# Patient Record
Sex: Female | Born: 1955 | Race: Black or African American | Hispanic: No | Smoking: Former smoker
Health system: Southern US, Community
[De-identification: ages and names within clinical notes are randomized; demographics above are authoritative.]

## PROBLEM LIST (undated history)

## (undated) DIAGNOSIS — G4730 Sleep apnea, unspecified: Secondary | ICD-10-CM

## (undated) DIAGNOSIS — J45909 Unspecified asthma, uncomplicated: Secondary | ICD-10-CM

## (undated) DIAGNOSIS — I639 Cerebral infarction, unspecified: Secondary | ICD-10-CM

## (undated) DIAGNOSIS — I10 Essential (primary) hypertension: Secondary | ICD-10-CM

## (undated) DIAGNOSIS — K219 Gastro-esophageal reflux disease without esophagitis: Secondary | ICD-10-CM

## (undated) DIAGNOSIS — F1010 Alcohol abuse, uncomplicated: Secondary | ICD-10-CM

## (undated) DIAGNOSIS — R569 Unspecified convulsions: Secondary | ICD-10-CM

## (undated) DIAGNOSIS — M62449 Contracture of muscle, unspecified hand: Secondary | ICD-10-CM

## (undated) DIAGNOSIS — K279 Peptic ulcer, site unspecified, unspecified as acute or chronic, without hemorrhage or perforation: Secondary | ICD-10-CM

## (undated) HISTORY — DX: Essential (primary) hypertension: I10

## (undated) HISTORY — PX: COLONOSCOPY: SHX174

## (undated) HISTORY — PX: BRAIN SURGERY: SHX531

## (undated) HISTORY — DX: Peptic ulcer, site unspecified, unspecified as acute or chronic, without hemorrhage or perforation: K27.9

---

## 2001-03-04 ENCOUNTER — Inpatient Hospital Stay (HOSPITAL_COMMUNITY): Admission: RE | Admit: 2001-03-04 | Discharge: 2001-03-07 | Payer: Self-pay | Admitting: Family Medicine

## 2001-04-02 ENCOUNTER — Emergency Department (HOSPITAL_COMMUNITY): Admission: EM | Admit: 2001-04-02 | Discharge: 2001-04-03 | Payer: Self-pay | Admitting: *Deleted

## 2001-04-02 ENCOUNTER — Encounter: Payer: Self-pay | Admitting: *Deleted

## 2001-05-25 ENCOUNTER — Encounter: Payer: Self-pay | Admitting: Family Medicine

## 2001-05-25 ENCOUNTER — Ambulatory Visit (HOSPITAL_COMMUNITY): Admission: RE | Admit: 2001-05-25 | Discharge: 2001-05-25 | Payer: Self-pay | Admitting: Family Medicine

## 2002-06-13 ENCOUNTER — Ambulatory Visit (HOSPITAL_COMMUNITY): Admission: RE | Admit: 2002-06-13 | Discharge: 2002-06-13 | Payer: Self-pay | Admitting: Family Medicine

## 2002-06-13 ENCOUNTER — Encounter: Payer: Self-pay | Admitting: Family Medicine

## 2002-09-25 ENCOUNTER — Encounter (HOSPITAL_COMMUNITY): Admission: RE | Admit: 2002-09-25 | Discharge: 2002-10-25 | Payer: Self-pay | Admitting: Oncology

## 2002-09-25 ENCOUNTER — Encounter: Admission: RE | Admit: 2002-09-25 | Discharge: 2002-09-25 | Payer: Self-pay | Admitting: Oncology

## 2002-10-18 HISTORY — PX: ABDOMINAL HYSTERECTOMY: SHX81

## 2002-11-28 ENCOUNTER — Encounter: Admission: RE | Admit: 2002-11-28 | Discharge: 2002-11-28 | Payer: Self-pay | Admitting: Oncology

## 2002-11-28 ENCOUNTER — Encounter (HOSPITAL_COMMUNITY): Admission: RE | Admit: 2002-11-28 | Discharge: 2002-12-28 | Payer: Self-pay | Admitting: Oncology

## 2002-12-27 ENCOUNTER — Encounter (HOSPITAL_COMMUNITY): Admission: RE | Admit: 2002-12-27 | Discharge: 2003-01-26 | Payer: Self-pay | Admitting: Oncology

## 2002-12-27 ENCOUNTER — Encounter: Admission: RE | Admit: 2002-12-27 | Discharge: 2002-12-27 | Payer: Self-pay | Admitting: Oncology

## 2003-01-28 ENCOUNTER — Encounter: Admission: RE | Admit: 2003-01-28 | Discharge: 2003-01-28 | Payer: Self-pay | Admitting: Oncology

## 2003-01-28 ENCOUNTER — Encounter (HOSPITAL_COMMUNITY): Admission: RE | Admit: 2003-01-28 | Discharge: 2003-02-27 | Payer: Self-pay | Admitting: Oncology

## 2003-02-18 ENCOUNTER — Inpatient Hospital Stay (HOSPITAL_COMMUNITY): Admission: RE | Admit: 2003-02-18 | Discharge: 2003-02-21 | Payer: Self-pay | Admitting: Obstetrics and Gynecology

## 2003-03-11 ENCOUNTER — Encounter: Admission: RE | Admit: 2003-03-11 | Discharge: 2003-03-11 | Payer: Self-pay | Admitting: Oncology

## 2003-06-06 ENCOUNTER — Encounter: Admission: RE | Admit: 2003-06-06 | Discharge: 2003-06-06 | Payer: Self-pay | Admitting: Oncology

## 2003-06-06 ENCOUNTER — Encounter (HOSPITAL_COMMUNITY): Admission: RE | Admit: 2003-06-06 | Discharge: 2003-07-06 | Payer: Self-pay | Admitting: Oncology

## 2003-07-23 ENCOUNTER — Encounter: Payer: Self-pay | Admitting: Family Medicine

## 2003-07-23 ENCOUNTER — Ambulatory Visit (HOSPITAL_COMMUNITY): Admission: RE | Admit: 2003-07-23 | Discharge: 2003-07-23 | Payer: Self-pay | Admitting: Family Medicine

## 2003-12-31 ENCOUNTER — Encounter (HOSPITAL_COMMUNITY): Admission: RE | Admit: 2003-12-31 | Discharge: 2004-01-30 | Payer: Self-pay | Admitting: Oncology

## 2003-12-31 ENCOUNTER — Encounter: Admission: RE | Admit: 2003-12-31 | Discharge: 2003-12-31 | Payer: Self-pay | Admitting: Oncology

## 2004-05-28 ENCOUNTER — Emergency Department (HOSPITAL_COMMUNITY): Admission: EM | Admit: 2004-05-28 | Discharge: 2004-05-28 | Payer: Self-pay | Admitting: Emergency Medicine

## 2004-06-05 ENCOUNTER — Ambulatory Visit (HOSPITAL_COMMUNITY): Admission: RE | Admit: 2004-06-05 | Discharge: 2004-06-05 | Payer: Self-pay | Admitting: General Surgery

## 2004-08-13 ENCOUNTER — Ambulatory Visit (HOSPITAL_COMMUNITY): Admission: RE | Admit: 2004-08-13 | Discharge: 2004-08-13 | Payer: Self-pay | Admitting: Family Medicine

## 2005-01-13 ENCOUNTER — Ambulatory Visit (HOSPITAL_COMMUNITY): Payer: Self-pay | Admitting: Oncology

## 2005-01-13 ENCOUNTER — Encounter (HOSPITAL_COMMUNITY): Admission: RE | Admit: 2005-01-13 | Discharge: 2005-02-12 | Payer: Self-pay | Admitting: Oncology

## 2005-01-13 ENCOUNTER — Encounter: Admission: RE | Admit: 2005-01-13 | Discharge: 2005-01-13 | Payer: Self-pay | Admitting: Oncology

## 2005-07-30 ENCOUNTER — Ambulatory Visit (HOSPITAL_COMMUNITY): Admission: RE | Admit: 2005-07-30 | Discharge: 2005-07-30 | Payer: Self-pay | Admitting: Family Medicine

## 2006-01-05 ENCOUNTER — Emergency Department (HOSPITAL_COMMUNITY): Admission: EM | Admit: 2006-01-05 | Discharge: 2006-01-05 | Payer: Self-pay | Admitting: Emergency Medicine

## 2006-02-11 ENCOUNTER — Emergency Department (HOSPITAL_COMMUNITY): Admission: EM | Admit: 2006-02-11 | Discharge: 2006-02-11 | Payer: Self-pay | Admitting: Emergency Medicine

## 2006-02-15 ENCOUNTER — Emergency Department (HOSPITAL_COMMUNITY): Admission: EM | Admit: 2006-02-15 | Discharge: 2006-02-15 | Payer: Self-pay | Admitting: Emergency Medicine

## 2006-08-01 ENCOUNTER — Ambulatory Visit (HOSPITAL_COMMUNITY): Admission: RE | Admit: 2006-08-01 | Discharge: 2006-08-01 | Payer: Self-pay | Admitting: Internal Medicine

## 2006-10-18 HISTORY — PX: OTHER SURGICAL HISTORY: SHX169

## 2007-06-17 ENCOUNTER — Emergency Department (HOSPITAL_COMMUNITY): Admission: EM | Admit: 2007-06-17 | Discharge: 2007-06-17 | Payer: Self-pay | Admitting: Emergency Medicine

## 2007-06-17 ENCOUNTER — Encounter: Payer: Self-pay | Admitting: Orthopedic Surgery

## 2007-06-29 ENCOUNTER — Ambulatory Visit: Payer: Self-pay | Admitting: Orthopedic Surgery

## 2007-07-04 ENCOUNTER — Ambulatory Visit (HOSPITAL_COMMUNITY): Admission: RE | Admit: 2007-07-04 | Discharge: 2007-07-04 | Payer: Self-pay | Admitting: Orthopedic Surgery

## 2007-07-04 ENCOUNTER — Ambulatory Visit: Payer: Self-pay | Admitting: Orthopedic Surgery

## 2007-07-06 ENCOUNTER — Ambulatory Visit: Payer: Self-pay | Admitting: Orthopedic Surgery

## 2007-07-13 ENCOUNTER — Ambulatory Visit: Payer: Self-pay | Admitting: Orthopedic Surgery

## 2007-07-25 DIAGNOSIS — R569 Unspecified convulsions: Secondary | ICD-10-CM | POA: Insufficient documentation

## 2007-07-25 DIAGNOSIS — I6789 Other cerebrovascular disease: Secondary | ICD-10-CM | POA: Insufficient documentation

## 2007-07-25 DIAGNOSIS — Z8679 Personal history of other diseases of the circulatory system: Secondary | ICD-10-CM | POA: Insufficient documentation

## 2007-07-27 ENCOUNTER — Ambulatory Visit: Payer: Self-pay | Admitting: Orthopedic Surgery

## 2007-07-27 DIAGNOSIS — S82209A Unspecified fracture of shaft of unspecified tibia, initial encounter for closed fracture: Secondary | ICD-10-CM | POA: Insufficient documentation

## 2007-08-17 ENCOUNTER — Ambulatory Visit: Payer: Self-pay | Admitting: Orthopedic Surgery

## 2007-08-24 ENCOUNTER — Encounter (INDEPENDENT_AMBULATORY_CARE_PROVIDER_SITE_OTHER): Payer: Self-pay | Admitting: *Deleted

## 2007-08-24 ENCOUNTER — Ambulatory Visit (HOSPITAL_COMMUNITY): Admission: RE | Admit: 2007-08-24 | Discharge: 2007-08-24 | Payer: Self-pay | Admitting: Internal Medicine

## 2007-08-30 ENCOUNTER — Ambulatory Visit: Payer: Self-pay | Admitting: Orthopedic Surgery

## 2007-09-28 ENCOUNTER — Ambulatory Visit: Payer: Self-pay | Admitting: Orthopedic Surgery

## 2007-09-28 ENCOUNTER — Telehealth: Payer: Self-pay | Admitting: Orthopedic Surgery

## 2007-10-02 ENCOUNTER — Telehealth: Payer: Self-pay | Admitting: Orthopedic Surgery

## 2007-10-09 ENCOUNTER — Telehealth: Payer: Self-pay | Admitting: Orthopedic Surgery

## 2007-10-09 ENCOUNTER — Encounter: Payer: Self-pay | Admitting: Orthopedic Surgery

## 2007-10-17 ENCOUNTER — Telehealth: Payer: Self-pay | Admitting: Orthopedic Surgery

## 2007-10-25 ENCOUNTER — Encounter: Payer: Self-pay | Admitting: Orthopedic Surgery

## 2007-11-08 ENCOUNTER — Ambulatory Visit: Payer: Self-pay | Admitting: Orthopedic Surgery

## 2008-01-05 ENCOUNTER — Encounter: Payer: Self-pay | Admitting: Orthopedic Surgery

## 2008-10-18 ENCOUNTER — Inpatient Hospital Stay (HOSPITAL_COMMUNITY): Admission: EM | Admit: 2008-10-18 | Discharge: 2008-10-20 | Payer: Self-pay | Admitting: Emergency Medicine

## 2008-11-06 ENCOUNTER — Emergency Department (HOSPITAL_COMMUNITY): Admission: EM | Admit: 2008-11-06 | Discharge: 2008-11-06 | Payer: Self-pay | Admitting: Emergency Medicine

## 2008-12-03 ENCOUNTER — Emergency Department (HOSPITAL_COMMUNITY): Admission: EM | Admit: 2008-12-03 | Discharge: 2008-12-04 | Payer: Self-pay | Admitting: Emergency Medicine

## 2008-12-04 ENCOUNTER — Ambulatory Visit: Payer: Self-pay | Admitting: *Deleted

## 2008-12-04 ENCOUNTER — Inpatient Hospital Stay (HOSPITAL_COMMUNITY): Admission: AD | Admit: 2008-12-04 | Discharge: 2008-12-06 | Payer: Self-pay | Admitting: *Deleted

## 2009-03-11 ENCOUNTER — Emergency Department (HOSPITAL_COMMUNITY): Admission: EM | Admit: 2009-03-11 | Discharge: 2009-03-12 | Payer: Self-pay | Admitting: Emergency Medicine

## 2009-04-14 ENCOUNTER — Ambulatory Visit (HOSPITAL_COMMUNITY): Admission: RE | Admit: 2009-04-14 | Discharge: 2009-04-14 | Payer: Self-pay | Admitting: Internal Medicine

## 2010-10-08 ENCOUNTER — Emergency Department (HOSPITAL_COMMUNITY)
Admission: EM | Admit: 2010-10-08 | Discharge: 2010-10-08 | Payer: Self-pay | Source: Home / Self Care | Admitting: Emergency Medicine

## 2010-10-10 ENCOUNTER — Emergency Department (HOSPITAL_COMMUNITY)
Admission: EM | Admit: 2010-10-10 | Discharge: 2010-10-10 | Payer: Self-pay | Source: Home / Self Care | Admitting: Emergency Medicine

## 2010-10-18 ENCOUNTER — Emergency Department (HOSPITAL_COMMUNITY)
Admission: EM | Admit: 2010-10-18 | Discharge: 2010-10-18 | Payer: Self-pay | Source: Home / Self Care | Admitting: Emergency Medicine

## 2010-11-07 ENCOUNTER — Encounter: Payer: Self-pay | Admitting: Family Medicine

## 2010-11-17 NOTE — Progress Notes (Signed)
Summary: vicodin       New/Updated Medications: * VICODIN 5/500 one tablet every 4 hrs as needed pain   Prescriptions: VICODIN 5/500 one tablet every 4 hrs as needed pain  #60 x 2   Entered by:   Waldon Reining   Authorized by:   Fuller Canada MD   Signed by:   Waldon Reining on 09/28/2007   Method used:   Print then Give to Patient   RxID:   [id number]

## 2010-11-17 NOTE — Letter (Signed)
Summary: Letter  Letter   Imported By: Elvera Maria 10/17/2007 13:43:03  _____________________________________________________________________  External Attachment:    Type:   Image     Comment:   oreder for evaluation

## 2010-11-17 NOTE — Op Note (Signed)
Summary: Operative Report  Operative Report   Imported By: Eldridge Dace 07/26/2007 08:28:30  _____________________________________________________________________  External Attachment:    Type:   Image     Comment:   op note closed reduction rt tib-fib

## 2010-11-17 NOTE — Letter (Signed)
Summary: *Orthopedic No Show Letter  [facility]  [address] [place] [address]  [place], [place] [address]   Phone: [phone]  Fax: [phone]    01/05/2008   MS Hikari Brafford [address] [place], [place]  [address]     Dear Ms. Kama,   Our records indicate that you missed your scheduled appointment with Dr. Beaulah Corin on 01/04/08.  Please contact this office to reschedule your appointment as soon as possible.  It is important that you keep your scheduled appointments with your physician, so we can provide you the best care possible.  We have enclosed an appointment card for your convenience.      Sincerely,   Dr. Terrance Mass, MD [facility] Phone [phone]

## 2010-11-17 NOTE — Miscellaneous (Signed)
Summary: advanced gait training  Clinical Lists Changes patient is to [facility] could not assist her. We can just cross out [facility] and writ [facility] on order and fax thanks

## 2010-11-17 NOTE — Miscellaneous (Signed)
Summary: home care order  Clinical Lists Changes  Orders: Added new Referral order of Home Health Referral [facility]) - Signed

## 2010-11-17 NOTE — Assessment & Plan Note (Signed)
    History of Present Illness: I saw [name] back in the office today.  She is now 3 weeeks s/p CL RD CAST RIGHT LEG.  no complaints        Physical Exam  cast intact   neurovascular intact    Impression & Recommendations:  Problem # 1:  FX CLOSED TIBIA NOS (ICD-823.80) Assessment: Unchanged x-rays today show normal alignment   Patient Instructions: 1)  Please schedule a follow-up appointment in 3 weeks. r tib fib Maurie.Mannan    ]

## 2010-11-17 NOTE — Progress Notes (Signed)
Summary: weight bearing status  Phone Note Other Incoming   Call placed by: ben gentry/[facility] Summary of Call: Denzil Magnuson with [facility] needs to know weight bearing status for [facility].   His phone # is [phone] ext [phone]. Initial call taken by: Jacklynn Ganong,  October 02, 2007 10:28 AM

## 2010-11-17 NOTE — Progress Notes (Signed)
Summary: Office Visit  Office Visit   Imported By: Eldridge Dace 07/26/2007 08:27:29  _____________________________________________________________________  External Attachment:    Type:   Image     Comment:   office visit

## 2010-11-17 NOTE — Assessment & Plan Note (Signed)
Summary: 3 WK RECK,XRAY TIB FIB RT/POST OP/CAF    History of Present Illness: follow up xrays in the cast   our x-ray tech is out  she doesn't have a ride to [facility]   she is having no problems           Impression & Recommendations:  Problem # 1:  FX CLOSED TIBIA NOS (ICD-823.80) Assessment: Unchanged  Orders: Post-Op Check (40981)    Patient Instructions: 1)  return next week to get the xray     ]

## 2010-11-17 NOTE — Assessment & Plan Note (Signed)
Summary: XRAYTIB FIB/BSF    History of Present Illness: I saw [name] in the office today for a followup visit.  She is a 55 years old woman with the complaint of:  right tib-fib fracture.   DOI / SURGERY SEP 16   Current Allergies (reviewed today): No known allergies  Updated/Current Medications (including changes made in today's visit):  * ENALAPRIL  * PHENOBARBITAL  * DILANTIN  * FAMOTIDINE  * IBUPROFEN 800MG  q8   Past Medical History:    Reviewed history from 07/25/2007 and no changes required:       High Blood Pressure       Seizures       Stroke  Past Surgical History:    Reviewed history from 07/25/2007 and no changes required:       Closed reduction of right tib-fib Dr.Harrison       C-section       Total Abdominal Hysterectomy       Cleaned aneurysm      Physical Exam  the skin under the castlooks fine     Impression & Recommendations:  Problem # 1:  FX CLOSED TIBIA NOS (ICD-823.80)  Orders: Post-Op Check (16109) Tibia/Fibula x-ray,  2 views (73590) ALIGNMENT WITH IN ACCEPTABLE RANGE   CAST CHANGE    Patient Instructions: 1)  Please schedule a follow-up appointment in 1 month. 2)  XRAYS/ BOOT IF XRAYS SHOW PROGRESS TO HEALIING     ]

## 2010-11-17 NOTE — Assessment & Plan Note (Signed)
Summary: 1 MO F/U XR/ BOOT IF XR SHOW PROGRESS IN HEALING.    History of Present Illness: I saw [name] in the office today for a 1 month followup visit.  DOS 07-04-07.  She is a 55 years old woman with the complaint of:  right Tib-Fib fracture. Patient is due for an xray today.    Current Allergies (reviewed today): No known allergies  Updated/Current Medications (including changes made in today's visit):  * ENALAPRIL  * PHENOBARBITAL  * DILANTIN  * FAMOTIDINE  * IBUPROFEN 800MG  q8   Past Medical History:    Reviewed history from 07/25/2007 and no changes required:       High Blood Pressure       Seizures       Stroke  Past Surgical History:    Reviewed history from 07/25/2007 and no changes required:       Closed reduction of right tib-fib Dr.Galit Urich       C-section       Total Abdominal Hysterectomy       Cleaned aneurysm      Physical Exam  Extremities:     The alignmnet is normal clinically. The skin is dry and there are no open areas; there was a scb on the heel which I debrided and cleaned with peroxide.  it was superficial and less than 2 mm.    Impression & Recommendations:  Problem # 1:  FX CLOSED TIBIA NOS (ICD-823.80) Assessment: Improved  Orders: Tibia/Fibula x-ray,  2 views (57846) The frature is in stable alignment; the lateral does not show callous but the AP does.  Post-Op Check [phone])  Orders: Tibia/Fibula x-ray,  2 views (28413) Post-Op Check (24401)    Patient Instructions: 1)  walk with platform walker and boot 2)  change dressing daily 3)  ok to take a bath take the brace offfor that 4)  do not walk on the leg until you have the platform attachment to the walker and the therapist teach you how to walk     ]

## 2010-11-17 NOTE — Letter (Signed)
Summary: *Orthopedic No Show Letter  [facility]  [address], [address]  [place], [place] [address]   Phone: [phone]  Fax: [phone]    08/24/2007   [name] [address] [place], [place]  [address]  Dear [name],   Our records indicate that you missed your scheduled appointment with Dr. Beaulah Corin on Wed, 08/23/07 for re-check and Xray.  Please contact this office to reschedule your appointment as soon as possible.  It is important that you keep your scheduled appointments with your physician, so we can provide you the best care possible.  We have enclosed an appointment card for your convenience.  Please be advised that there may be a charge for "no show" appointments.    Sincerely,   Dr. Terrance Mass, MD [facility] Phone [phone]

## 2010-11-17 NOTE — Assessment & Plan Note (Signed)
Summary: 1 mo RECHECK TIB FIB/BRACE/MEDICAID/CAF    History of Present Illness: I saw [name] in the office today for a followup visit.  She is a 55 years old woman with the complaint of:  right leg pain.  Today is a recheck since patient is in walker boot and was ordered a platform walker to help her walk.  She did get the platform walker, but therapist said that she did not need it, that she needed a different brace.  Pain level right now is around 1, she still has swelling.  She has been putting weight on her foot and she is doing well with that.    Current Allergies: No known allergies      Review of Systems  MS      Denies joint pain, rheumatoid arthritis, joint swelling, gout, bone cancer, osteoporosis, and .   Physical Exam  the limb alignment is normal  there is a slight plantar flexion contracture which may be chronic from the previous cva the frature is non tender     Impression & Recommendations:  Problem # 1:  FX CLOSED TIBIA NOS (ICD-823.80)  Orders: Tibia/Fibula x-ray,  2 views (09811) the fracture is consolidating slowly. the alignmnet is well with in acceptable limits  Est. Patient Level III (91478)  Orders: Tibia/Fibula x-ray,  2 views (29562) Est. Patient Level III (13086)    Patient Instructions: 1)  Walk as tolerated in the brace 2)  use 4 prong cane  3)  Please schedule a follow-up appointment in 2 months. x-rays     ]

## 2010-11-17 NOTE — Progress Notes (Signed)
Summary: therapy discharge  Phone Note From Other Clinic   Caller: ben Summary of Call: ben from [facility] called and said that patient has reached maximum therapy and is being discharged from therapy with home exercises. Initial call taken by: Ether Griffins,  October 17, 2007 3:23 PM

## 2010-11-17 NOTE — Miscellaneous (Signed)
Summary: Rehab Report  Rehab Report   Imported By: Cammie Sickle 11/04/2007 18:27:17  _____________________________________________________________________  External Attachment:    Type:   Image     Comment:   External Document

## 2010-11-17 NOTE — Progress Notes (Signed)
Summary: evaluation order  Phone Note Other Incoming Call back at [phone]   Summary of Call: alice ward from [facility] needs an order to to social work evaluation on this patient   fax [phone] Initial call taken by: Ether Griffins,  October 09, 2007 11:52 AM  Follow-up for Phone Call        ok to give it to her  Follow-up by: Fuller Canada MD,  October 09, 2007 12:02 PM  Additional Follow-up for Phone Call Additional follow up Details #1::        faxed order to [email or web address] Additional Follow-up by: Ether Griffins,  October 09, 2007 1:38 PM

## 2010-11-17 NOTE — Letter (Signed)
Summary: PT ref fax to [facility]  PT ref fax to [facility]   Imported By: Cammie Sickle 09/28/2007 18:33:39  _____________________________________________________________________  External Attachment:    Type:   Image     Comment:   External Document

## 2010-11-17 NOTE — Progress Notes (Signed)
Summary: Office Visit  Office Visit   Imported By: Eldridge Dace 07/26/2007 08:26:43  _____________________________________________________________________  External Attachment:    Type:   Image     Comment:   initial visit

## 2010-12-28 LAB — CBC
HCT: 38.4 % (ref 36.0–46.0)
HCT: 39.7 % (ref 36.0–46.0)
Hemoglobin: 13.5 g/dL (ref 12.0–15.0)
Hemoglobin: 14.2 g/dL (ref 12.0–15.0)
MCH: 32.7 pg (ref 26.0–34.0)
MCH: 33.3 pg (ref 26.0–34.0)
MCHC: 35.2 g/dL (ref 30.0–36.0)
MCHC: 35.8 g/dL (ref 30.0–36.0)
MCV: 93 fL (ref 78.0–100.0)
MCV: 93.2 fL (ref 78.0–100.0)
Platelets: 184 10*3/uL (ref 150–400)
Platelets: 254 10*3/uL (ref 150–400)
RBC: 4.13 MIL/uL (ref 3.87–5.11)
RBC: 4.26 MIL/uL (ref 3.87–5.11)
RDW: 12.4 % (ref 11.5–15.5)
RDW: 12.9 % (ref 11.5–15.5)
WBC: 4.3 10*3/uL (ref 4.0–10.5)
WBC: 4.8 10*3/uL (ref 4.0–10.5)

## 2010-12-28 LAB — BASIC METABOLIC PANEL
BUN: 2 mg/dL — ABNORMAL LOW (ref 6–23)
CO2: 26 mEq/L (ref 19–32)
Calcium: 9.1 mg/dL (ref 8.4–10.5)
Chloride: 100 mEq/L (ref 96–112)
Creatinine, Ser: 0.48 mg/dL (ref 0.4–1.2)
GFR calc Af Amer: >60 mL/min (ref >60)
GFR calc non Af Amer: >60 mL/min (ref >60)
Glucose, Bld: 103 mg/dL — ABNORMAL HIGH (ref 70–99)
Potassium: 3.8 mEq/L (ref 3.5–5.1)
Sodium: 138 mEq/L (ref 135–145)

## 2010-12-28 LAB — HEPATIC FUNCTION PANEL
ALT: 56 U/L — ABNORMAL HIGH (ref 0–35)
AST: 158 U/L — ABNORMAL HIGH (ref 0–37)
Albumin: 3.8 g/dL (ref 3.5–5.2)
Alkaline Phosphatase: 102 U/L (ref 39–117)
Bilirubin, Direct: 0.2 mg/dL (ref 0.0–0.3)
Indirect Bilirubin: 0.3 mg/dL (ref 0.3–0.9)
Total Bilirubin: 0.5 mg/dL (ref 0.3–1.2)
Total Protein: 8 g/dL (ref 6.0–8.3)

## 2010-12-28 LAB — COMPREHENSIVE METABOLIC PANEL
ALT: 35 U/L (ref 0–35)
AST: 74 U/L — ABNORMAL HIGH (ref 0–37)
Albumin: 4.1 g/dL (ref 3.5–5.2)
Alkaline Phosphatase: 83 U/L (ref 39–117)
BUN: 3 mg/dL — ABNORMAL LOW (ref 6–23)
CO2: 27 mEq/L (ref 19–32)
Calcium: 9.5 mg/dL (ref 8.4–10.5)
Chloride: 100 mEq/L (ref 96–112)
Creatinine, Ser: 0.5 mg/dL (ref 0.4–1.2)
GFR calc Af Amer: >60 mL/min (ref >60)
GFR calc non Af Amer: >60 mL/min (ref >60)
Glucose, Bld: 101 mg/dL — ABNORMAL HIGH (ref 70–99)
Potassium: 2.8 mEq/L — ABNORMAL LOW (ref 3.5–5.1)
Sodium: 140 mEq/L (ref 135–145)
Total Bilirubin: 0.4 mg/dL (ref 0.3–1.2)
Total Protein: 8.2 g/dL (ref 6.0–8.3)

## 2010-12-28 LAB — DIFFERENTIAL
Basophils Absolute: 0 10*3/uL (ref 0.0–0.1)
Basophils Absolute: 0 10*3/uL (ref 0.0–0.1)
Basophils Relative: 1 % (ref 0–1)
Basophils Relative: 1 % (ref 0–1)
Eosinophils Absolute: 0 10*3/uL (ref 0.0–0.7)
Eosinophils Absolute: 0 10*3/uL (ref 0.0–0.7)
Eosinophils Relative: 0 % (ref 0–5)
Eosinophils Relative: 1 % (ref 0–5)
Lymphocytes Relative: 28 % (ref 12–46)
Lymphocytes Relative: 48 % — ABNORMAL HIGH (ref 12–46)
Lymphs Abs: 1.2 10*3/uL (ref 0.7–4.0)
Lymphs Abs: 2.3 10*3/uL (ref 0.7–4.0)
Monocytes Absolute: 0.2 10*3/uL (ref 0.1–1.0)
Monocytes Absolute: 0.5 10*3/uL (ref 0.1–1.0)
Monocytes Relative: 11 % (ref 3–12)
Monocytes Relative: 6 % (ref 3–12)
Neutro Abs: 1.9 10*3/uL (ref 1.7–7.7)
Neutro Abs: 2.9 10*3/uL (ref 1.7–7.7)
Neutrophils Relative %: 39 % — ABNORMAL LOW (ref 43–77)
Neutrophils Relative %: 66 % (ref 43–77)

## 2010-12-28 LAB — APTT: aPTT: 33 seconds (ref 24–37)

## 2010-12-28 LAB — ETHANOL
Alcohol, Ethyl (B): 358 mg/dL — ABNORMAL HIGH (ref 0–10)
Alcohol, Ethyl (B): 368 mg/dL — ABNORMAL HIGH (ref 0–10)

## 2010-12-28 LAB — PROTIME-INR
INR: 1.01 (ref 0.00–1.49)
Prothrombin Time: 13.5 seconds (ref 11.6–15.2)

## 2010-12-28 LAB — LIPASE, BLOOD: Lipase: 23 U/L (ref 11–59)

## 2010-12-28 LAB — PHENYTOIN LEVEL, TOTAL: Phenytoin Lvl: <2.5 ug/mL — ABNORMAL LOW (ref 10.0–20.0)

## 2011-02-01 LAB — CBC
HCT: 41.2 % (ref 36.0–46.0)
Hemoglobin: 13.8 g/dL (ref 12.0–15.0)
MCHC: 33.5 g/dL (ref 30.0–36.0)
MCV: 92.9 fL (ref 78.0–100.0)
Platelets: 155 K/uL (ref 150–400)
RBC: 4.43 MIL/uL (ref 3.87–5.11)
RDW: 12.1 % (ref 11.5–15.5)
WBC: 7.6 K/uL (ref 4.0–10.5)

## 2011-02-01 LAB — DIFFERENTIAL
Basophils Absolute: 0 K/uL (ref 0.0–0.1)
Basophils Relative: 1 % (ref 0–1)
Eosinophils Absolute: 0 K/uL (ref 0.0–0.7)
Eosinophils Relative: 0 % (ref 0–5)
Lymphocytes Relative: 19 % (ref 12–46)
Lymphs Abs: 1.4 K/uL (ref 0.7–4.0)
Monocytes Absolute: 0.5 K/uL (ref 0.1–1.0)
Monocytes Relative: 7 % (ref 3–12)
Neutro Abs: 5.6 K/uL (ref 1.7–7.7)
Neutrophils Relative %: 73 % (ref 43–77)

## 2011-02-01 LAB — COMPREHENSIVE METABOLIC PANEL WITH GFR
ALT: 35 U/L (ref 0–35)
AST: 64 U/L — ABNORMAL HIGH (ref 0–37)
Albumin: 3.8 g/dL (ref 3.5–5.2)
Alkaline Phosphatase: 74 U/L (ref 39–117)
BUN: 8 mg/dL (ref 6–23)
CO2: 24 meq/L (ref 19–32)
Calcium: 8.5 mg/dL (ref 8.4–10.5)
Chloride: 98 meq/L (ref 96–112)
Creatinine, Ser: 0.54 mg/dL (ref 0.4–1.2)
GFR calc non Af Amer: >60 mL/min
Glucose, Bld: 157 mg/dL — ABNORMAL HIGH (ref 70–99)
Potassium: 3.2 meq/L — ABNORMAL LOW (ref 3.5–5.1)
Sodium: 134 meq/L — ABNORMAL LOW (ref 135–145)
Total Bilirubin: 0.4 mg/dL (ref 0.3–1.2)
Total Protein: 7.2 g/dL (ref 6.0–8.3)

## 2011-02-01 LAB — ETHANOL: Alcohol, Ethyl (B): 188 mg/dL — ABNORMAL HIGH (ref 0–10)

## 2011-02-01 LAB — PHENYTOIN LEVEL, TOTAL
Phenytoin Lvl: 17.5 ug/mL (ref 10.0–20.0)
Phenytoin Lvl: <2.5 ug/mL — ABNORMAL LOW (ref 10.0–20.0)

## 2011-02-01 LAB — PHENOBARBITAL LEVEL
Phenobarbital: 5 ug/mL — ABNORMAL LOW (ref 15.0–40.0)
Phenobarbital: <5 ug/mL — ABNORMAL LOW (ref 15.0–40.0)

## 2011-02-01 LAB — GLUCOSE, CAPILLARY: Glucose-Capillary: 93 mg/dL (ref 70–99)

## 2011-02-02 LAB — COMPREHENSIVE METABOLIC PANEL
ALT: 49 U/L — ABNORMAL HIGH (ref 0–35)
AST: 63 U/L — ABNORMAL HIGH (ref 0–37)
Albumin: 4.2 g/dL (ref 3.5–5.2)
Alkaline Phosphatase: 106 U/L (ref 39–117)
BUN: 2 mg/dL — ABNORMAL LOW (ref 6–23)
CO2: 25 mEq/L (ref 19–32)
Calcium: 9.6 mg/dL (ref 8.4–10.5)
Chloride: 108 mEq/L (ref 96–112)
Creatinine, Ser: 0.48 mg/dL (ref 0.4–1.2)
GFR calc Af Amer: >60 mL/min (ref >60)
GFR calc non Af Amer: >60 mL/min (ref >60)
Glucose, Bld: 114 mg/dL — ABNORMAL HIGH (ref 70–99)
Potassium: 4 mEq/L (ref 3.5–5.1)
Sodium: 146 mEq/L — ABNORMAL HIGH (ref 135–145)
Total Bilirubin: 0.4 mg/dL (ref 0.3–1.2)
Total Protein: 7.8 g/dL (ref 6.0–8.3)

## 2011-02-02 LAB — RAPID URINE DRUG SCREEN, HOSP PERFORMED
Amphetamines: NOT DETECTED
Barbiturates: POSITIVE — AB
Benzodiazepines: NOT DETECTED
Cocaine: NOT DETECTED
Opiates: NOT DETECTED
Tetrahydrocannabinol: NOT DETECTED

## 2011-02-02 LAB — DIFFERENTIAL
Basophils Absolute: 0 10*3/uL (ref 0.0–0.1)
Basophils Relative: 0 % (ref 0–1)
Eosinophils Absolute: 0 10*3/uL (ref 0.0–0.7)
Eosinophils Relative: 1 % (ref 0–5)
Lymphocytes Relative: 44 % (ref 12–46)
Lymphs Abs: 2.1 10*3/uL (ref 0.7–4.0)
Monocytes Absolute: 0.3 10*3/uL (ref 0.1–1.0)
Monocytes Relative: 5 % (ref 3–12)
Neutro Abs: 2.3 10*3/uL (ref 1.7–7.7)
Neutrophils Relative %: 49 % (ref 43–77)

## 2011-02-02 LAB — CBC
HCT: 35 % — ABNORMAL LOW (ref 36.0–46.0)
Hemoglobin: 12 g/dL (ref 12.0–15.0)
MCHC: 34.2 g/dL (ref 30.0–36.0)
MCV: 92.2 fL (ref 78.0–100.0)
Platelets: 161 10*3/uL (ref 150–400)
RBC: 3.8 MIL/uL — ABNORMAL LOW (ref 3.87–5.11)
RDW: 13.3 % (ref 11.5–15.5)
WBC: 4.8 10*3/uL (ref 4.0–10.5)

## 2011-02-02 LAB — ETHANOL
Alcohol, Ethyl (B): 349 mg/dL — ABNORMAL HIGH (ref 0–10)
Alcohol, Ethyl (B): 92 mg/dL — ABNORMAL HIGH (ref 0–10)

## 2011-02-02 LAB — ACETAMINOPHEN LEVEL: Acetaminophen (Tylenol), Serum: <10 ug/mL — ABNORMAL LOW (ref 10–30)

## 2011-02-02 LAB — PHENYTOIN LEVEL, TOTAL: Phenytoin Lvl: <2.5 ug/mL — ABNORMAL LOW (ref 10.0–20.0)

## 2011-02-02 LAB — SALICYLATE LEVEL: Salicylate Lvl: <4 mg/dL (ref 2.8–20.0)

## 2011-03-02 NOTE — H&P (Signed)
NAME:  [name], [name] NO.:  [id number]   MEDICAL RECORD NO.:  [id number]          PATIENT TYPE:  IPS   LOCATION:  0300                          FACILITY:  [facility]   PHYSICIAN:  Jasmine Pang, M.D. DATE OF BIRTH:  1956/08/06   DATE OF ADMISSION:  12/04/2008  DATE OF DISCHARGE:                       PSYCHIATRIC ADMISSION ASSESSMENT   This is a 55 year old female voluntarily admitted on December 04, 2008.   HISTORY OF PRESENT ILLNESS:  The patient is here to dry out.  She has  been drinking alcohol, drinking an unknown amount of alcohol on a fairly  consistent basis with her last drink being 2 days ago.  She denies any  depression or suicidal thoughts.  Reports no significant stressors.  Has  a history of a CVA at the age of 65 but is able to perform all ADLs.  She has been sleeping adequately, and appetite has also been  satisfactory.   PAST PSYCHIATRIC HISTORY:  First admission to [facility]  was at [facility] in the past for alcohol abuse.   SOCIAL HISTORY:  A 52-year female.  She lives alone.  Lives in  [place].   FAMILY HISTORY:  Is unknown.   ALCOHOL AND DRUG HISTORY:  Has a history of seizure activity, although  not related to alcohol use.  No blackouts.   PRIMARY CARE Kathie Posa:  She believes is Dr. Felecia Shelling in [place].   MEDICAL PROBLEMS:  Are history of a CVA with right-sided hemophoresis at  the age of 55, hypertension and ulcer.   MEDICATIONS:  1. Phenobarbital 50 mg b.i.d.  2. Dilantin 100 mg taking 3 at bedtime.   DRUG ALLERGIES:  PENICILLIN and CODEINE.   PHYSICAL EXAMINATION:  This is a middle-aged female.  She appears in no  acute distress, although she is sweating.  No tremors were noted.  She  was fully assessed at [facility] emergency department and received  thiamine IV and fluids.  Temperature of 98.4, 90 heart rate, 20  respirations, blood pressure 165/88, 152 pounds, 5 feet 4 inches tall.   LABORATORY DATA:  Shows a  normal EKG.  Acetaminophen level less than 10.  Glucose of 114.  Alcohol level of 349.  Dilantin level less than 2.5.  Salicylate level less than 4.  RBC count 3.80.   MENTAL STATUS EXAM:  This is a fully alert, cooperative female,  pleasant, casually dressed.  Speech is difficult to understand at times,  somewhat of a poor historian in regards to dates and medications.  Her  mood is neutral.  The patient __________ she is pleasant, has a sense of  humor.  Thought processes are coherent, goal directed.  Cognitive  function intact.  Her memory is again is fair.  Judgment and insight  appear to be good.   AXIS I:  Alcohol dependence.  AXIS II:  Deferred.  AXIS III:  Hypertension, seizure disorder and history of cerebrovascular  accident.  AXIS IV:  Medical problems and chronic substance use.  AXIS V:  Current is 45.   Our plan is to contract for safety.  We will continue with  the patient's  Dilantin, phenobarbital and put patient on Librium protocol.  Will work  on relapse prevention.  We discussed long-term rehab with the patient.  The patient does not feel that she is ready at this time for long-term  program.  We will continue to assess her support group and reinforce  medication compliance.  Her tentative length of stay at this time is 3-5  days.      Landry Corporal, N.P.      Jasmine Pang, M.D.  Electronically Signed    JO/MEDQ  D:  12/05/2008  T:  12/05/2008  Job:  [id number]

## 2011-03-02 NOTE — Discharge Summary (Signed)
NAME:  [name], BOUCHILLON NO.:  [id number]   MEDICAL RECORD NO.:  [id number]           PATIENT TYPE:   LOCATION:                                 FACILITY:   PHYSICIAN:  Tesfaye D. Felecia Shelling, MD   DATE OF BIRTH:  October 03, 1956   DATE OF ADMISSION:  10/18/2008  DATE OF DISCHARGE:  01/03/2010LH                               DISCHARGE SUMMARY   DISCHARGE DIAGNOSES:  1. Seizure disorder.  2. Hypertension.  3. Medical noncompliance.  4. History of cerebrovascular accident.  5. Alcohol abuse.  6. History of right tibia and fibula fracture.   DISCHARGE MEDICATIONS:  1. Phenobarb 15 mg b.i.d.  2. Dilantin 300 mg daily.  3. Thiamine 100 mg daily.  4. Folic acid 1 mg daily.  5. Hydrochlorothiazide 25 mg daily.   DISPOSITION:  The patient was discharged to home in stable condition.   HOSPITAL COURSE:  This is a 55 years old female patient with history of  seizure disorder and alcohol abuse was brought to emergency room after  the patient had recurrent episode of seizure disorder.  The patient has  been noncompliant on her medication.  She has stopped taking her  Dilantin and phenobarb.  The patient continued to drink alcohol heavily.  She had an episode of recurrent seizure in the house.  The patient was  brought to emergency room where she was evaluated.  Her blood test  showed very low Dilantin and phenobarb level.  Her alcohol level was  very high.  She had also symptoms of nausea and vomiting.  The patient  was started on IV fluid, IV Dilantin, and phenobarb.  The patient was  admitted.  Her seizure was controlled.  The patient was started on oral  anticonvulsant medications.  She was strongly advised to continue her  seizure medications and she was also advised to stop alcohol.  She was  discharged to home in stable condition to be followed in outpatient.      Tesfaye D. Felecia Shelling, MD  Electronically Signed     TDF/MEDQ  D:  11/04/2008  T:  11/04/2008  Job:  [id number]

## 2011-03-02 NOTE — H&P (Signed)
NAME:  [name], [name] NO.:  [id number]   MEDICAL RECORD NO.:  [id number]          PATIENT TYPE:  AMB   LOCATION:  DAY                           FACILITY:  [facility]   PHYSICIAN:  Vickki Hearing, M.D.DATE OF BIRTH:  Oct 15, 1956   DATE OF ADMISSION:  DATE OF DISCHARGE:  LH                              HISTORY & PHYSICAL   CHIEF COMPLAINT:  Fractured right leg.   HISTORY:  This is a 55 year old female with a history of seizure  disorder, last seizure more than 2 years ago, treated with phenobarbital  and Dilantin.  She is status post a cesarean section, a hysterectomy,  aneurysm repair, with a history of hypertension, stroke at age 37 with  right-sided residual, who was ambulatory with no assistive device and a  limp and fell on June 17, 2007, was placed in a long-leg splint,  missed her appointment due to her Zenaida Niece ride being mis-scheduled,  presented to Dr. Letitia Neri office with the fracture, and then he sent her  here for follow-up care.  She has constant, throbbing pain, rated as a  9.  She says the Vicodin does not help.  She says the Tylenol 500 mg  does help.  She complains of no neurologic symptoms.   She has a history of ulcers.  She denies weight loss, chest pain,  shortness of breath, dysuria, hematuria, headache, joint pain, thyroid  disease, depression, eczema, poor vision, seasonal allergies, or lymph  node problem.  She also takes enalapril and famotidine.  She has a  family history of arthritis.  She is single, does not smoke, drinks one  40-ounces alcoholic beverage a day.  She went to school through the 10th  grade.  She is allergic to PENICILLIN. Her weight 140, her pulses is 80,  her respiratory rate is 16.  Development is normal.  Nutritional status  is moderate.  Body habitus thin.  Gross deformities:  None.  Grooming  poor.  Her splint was soiled with urine Cardiovascular:  The pulse and  perfusion of the limb was normal, with no swelling,  except for the  fracture-related edema.  The gait and station:  She is nonambulatory.  She is in a wheelchair.  She was ambulatory with a limp and no assistive  device before the injury.  There is deformity of the tibia with  tenderness.  There is swelling.  The compartments are soft.  The knee  joint and ankle joint are not assessed for stability because of pain and  fear of dislodging and displacing the fracture.  Muscle tone is normal.  Her upper extremities:  On the left side is normal.  On the right, she  has some internal rotation and habitus of CVA.  Skin is intact.  No  breaks in the skin.  Sensation is good.  Reflexes are deferred.  Coordination is deferred.  She is oriented.  Mood is pleasant.  X-rays  show a spiral-type midshaft tibia fracture with fibular fracture.  Recommend closed reduction, cast right leg.      Vickki Hearing, M.D.  Electronically Signed  SEH/MEDQ  D:  06/29/2007  T:  06/30/2007  Job:  [id number]

## 2011-03-02 NOTE — Op Note (Signed)
NAME:  [name], [name] NO.:  [id number]   MEDICAL RECORD NO.:  [id number]          PATIENT TYPE:  AMB   LOCATION:  DAY                           FACILITY:  [facility]   PHYSICIAN:  Vickki Hearing, M.D.DATE OF BIRTH:  08-05-1956   DATE OF PROCEDURE:  07/04/2007  DATE OF DISCHARGE:                               OPERATIVE REPORT   This is a 55 year old female, seizure disorder, fell coming down some  stairs on August 30, fractured her right tib-fib.  She was seen in the  office and scheduled for surgery as a closed right tib-fib fracture.   PREOPERATIVE DIAGNOSIS:  Closed right tibia-fibula fracture.   POSTOPERATIVE DIAGNOSIS:  Closed right tibia-fibula fracture.   PROCEDURE:  Closed reduction and application of short-leg cast, right  leg.   SURGEON:  Vickki Hearing, MD.  There no assistants.   ANESTHETIC:  General.   FINDINGS:  Spiral tib-fib fracture, right leg, closed.  The skin intact.   No pathology specimens.  No complications.  Patient to PACU, good  condition..   The patient was identified in the preop holding area.  The history and  physical was updated as required.  The patient was taken to surgery, had  general anesthetic.  Had closed manipulation of the fracture under C-  arm.  The fracture was then casted and radiographs confirmed adequate  reduction.  There is a slight rotational separation at the fracture site  on the lateral view.  AP view looks perfectly reduced.  In this 51-year-  old female this is an acceptable alignment with her history of seizure  disorder and previous stroke with right limb residual.  The patient will  continue to be nonweightbearing.  The patient was extubated, taken to  the recovery room in stable condition for follow-up visit scheduled in a  week for x-rays.   She is discharged on Vicodin 5 mg one to two q.4h. p.r.n. for pain.      Vickki Hearing, M.D.  Electronically Signed     SEH/MEDQ  D:   07/04/2007  T:  07/04/2007  Job:  [id number]

## 2011-03-02 NOTE — H&P (Signed)
NAME:  [name], [name] NO.:  [id number]   MEDICAL RECORD NO.:  [id number]          PATIENT TYPE:  INP   LOCATION:  A331                          FACILITY:  [facility]   PHYSICIAN:  Tesfaye D. Felecia Shelling, MD   DATE OF BIRTH:  03/12/1956   DATE OF ADMISSION:  10/17/2008  DATE OF DISCHARGE:  LH                              HISTORY & PHYSICAL   CHIEF COMPLAINT:  Seizure disorder.   HISTORY OF PRESENT ILLNESS:  This is a 55 year old female patient with  history of seizure disorder and alcohol abuse who came to emergency room  with the above complaints.  The patient has been noncompliant on her  medications.  She has not been taking her Dilantin and phenobarb for her  seizure.  Recently she was seen in the office and the patient was  promptly advised to start back on the medications.  In the meantime the  patient continued to drink heavily.  She had a recurrent seizure  yesterday.  The patient was brought to emergency room and she was loaded  with Dilantin and phenobarb and the patient was then admitted for  further treatment.  The patient was confused and disoriented  when she  was brought to the emergency room.  She had very high alcohol level.  Her dilantin and phenobarb level were very low.  She had also symptoms  of nausea and vomiting.  The patient was admitted on IV fluids and the  patient was made n.p.o..   PAST MEDICAL HISTORY:  1. Hypertension.  2. Seizure disorder.  3. CVA.  4. History of fracture of the right tibia and fibula.   CURRENT MEDICATIONS:  1. Dilantin 300 mg p.o. daily.  2. Phenobarb 15 mg p.o. t.i.d.  3. Hydrochlorothiazide 2.2 mg daily.   SOCIAL HISTORY:  Patient has history of alcohol abuse and chronic  tobacco addiction.  No history of substance abuse.   REVIEW OF SYSTEMS:  The patient has no fever, chills, cough, chest pain,  abdominal pain, dysuria, urgency or frequency of urination.  Her nausea  and vomiting is getting better and better.   PHYSICAL EXAMINATION:  GENERAL:  The patient is alert, awake and  chronically sick looking.  VITAL SIGNS:  Blood pressure 145/79, __________  HEENT:  Pupils are equal and reactive.  NECK:  Supple.  CHEST:  Clear.  CARDIOVASCULAR:  First and second heart sounds heard.  Tachycardiac.  No  murmur or rub.  ABDOMEN:  Bowel sounds positive.  No mass or organomegaly.  EXTREMITIES:  No leg edema.   LABORATORY DATA:  Dilantin 7.4.  CBC: WBC of __________, hemoglobin  13.8, hematocrit 41.2, platelet count 155,000.  Alcohol level is 188.  Phenobarb 5.   ASSESSMENT:  This is a 55 year old female patient with history of  seizure disorder hypertension and alcohol abuse who has been  noncompliant on her medication daily, came due to breakthrough seizures.  The patient is currently started back on Dilantin and phenobarbital and  the seizure __________.   PLAN:  We will continue the patient on the __________ medications.  Start the patient on  dilantin 40,000.  Will do seizure precaution.  Supportive care.      Tesfaye D. Felecia Shelling, MD  Electronically Signed     TDF/MEDQ  D:  10/18/2008  T:  10/18/2008  Job:  [id number]

## 2011-03-05 NOTE — Discharge Summary (Signed)
NAME:  [name], [name]                        ACCOUNT NO.:  [id number]   MEDICAL RECORD NO.:  [id number]                   PATIENT TYPE:  INP   LOCATION:  A416                                 FACILITY:  [facility]   PHYSICIAN:  Tilda Burrow, M.D.              DATE OF BIRTH:  Oct 25, 1955   DATE OF ADMISSION:  02/18/2003  DATE OF DISCHARGE:  02/21/2003                                 DISCHARGE SUMMARY   ADMITTING DIAGNOSES:  1. Menorrhagia.  2. Iron deficiency anemia.  3. Uterine fibroids.  4. Severe alcoholism.  5. 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  6. History of seizures.   DISCHARGE DIAGNOSES:  1. Menorrhagia.  2. Iron deficiency anemia.  3. Uterine fibroids.  4. Severe alcoholism.  5. 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  6. History of seizures.   PROCEDURE:  Abdominal hysterectomy.   HISTORY OF PRESENT ILLNESS:  This 55 year old female shared care by Dr.  Syliva Overman and Dr. Glenford Peers and myself was admitted for iron  deficiency anemia due to menstrual blood loss and nutritional factors  related to her chronic alcoholism and cirrhosis.  She has had endometrial  biopsy, hysteroscopy, and D&C in the past.  She has responded to iron  therapy to increase her hemoglobin to admission value of 10.2.   HOSPITAL COURSE:  The patient was admitted on Feb 18, 2003 and underwent  abdominal hysterectomy and bilateral salpingo-oophorectomy by Dr. Jannifer Franklin with 100 mL blood loss.  The patient had some decreased urinary  output the day of surgery with no distention and active bowel sounds.  Hemoglobin dropped inappropriately for the anticipated blood loss with  hemoglobin 8.6 and hematocrit 26 compared to 10.2 and 31.9 preoperatively.  It was felt that she may have had some postoperative intraabdominal oozing.  She did have a minimal febrile  morbidity with maximum temperature of 100.0  on postoperative day #2.  The abdomen was soft, incision clean.  She was  kept an additional 24 hours due to lack of support at home.  Incision was  acceptable despite some inversion of the skin beneath the staples.  By Feb 21, 2003 she was stable for discharge and was sent home on pain medicines.  The  pathology report returned showing a 265 gram uterus consistent with 11- to  12-week size uterus, with adenomyosis present, multiple leiomyomas in the  walls and beneath the endometrium.  This was felt to explain the bleeding.  The patient was considered stable for follow-up in one week for incision  check and four weeks for postoperative care.  Tilda Burrow, M.D.    JVF/MEDQ  D:  03/25/2003  T:  03/25/2003  Job:  [id number]   cc:   Milus Mallick. Lodema Hong, M.D.  [address]  [place], [place] [address]  Fax: [phone]   Ladona Horns. Neijstrom, MD  [address]  [place]  [place] [address]  Fax: [phone]

## 2011-03-05 NOTE — Op Note (Signed)
NAME:  [name], [name]                        ACCOUNT NO.:  [id number]   MEDICAL RECORD NO.:  [id number]                   PATIENT TYPE:  AMB   LOCATION:  DAY                                  FACILITY:  [facility]   PHYSICIAN:  Tilda Burrow, M.D.              DATE OF BIRTH:  1956/06/29   DATE OF PROCEDURE:  DATE OF DISCHARGE:                                 OPERATIVE REPORT   PREOPERATIVE DIAGNOSES:  1. Menorrhagia.  2. Uterine fibroids.  3. Anemia.  4. Pica.  5. Seizure disorder.  6. Status post right frontal lobe surgery.   POSTOPERATIVE DIAGNOSES:  1. Menorrhagia.  2. Uterine fibroids.  3. Anemia.  4. Pica.  5. Seizure disorder.  6. Status post right frontal lobe surgery.   OPERATION/PROCEDURE:  Total abdominal hysterectomy, bilateral salpingo-  oophorectomy.   SURGEON:  Tilda Burrow, M.D.   ASSISTANTAmie Critchley, C.S.T. and Angelena Sole, M.D. [facility]   ANESTHESIA:  General - Yates, C.R.N.A.   COMPLICATIONS:  None.   ESTIMATED BLOOD LOSS:  100 cc.   FINDINGS:  Irregular fibroids, uterus 200 g estimated weight, thick round  ligaments.   DESCRIPTION OF PROCEDURE:  The patient was taken to the operating room,  prepped and draped in the usual fashion for lower abdominal surgery with  Pfannenstiel-type incision repeated.  She had had a prior cesarean section.  The old cicatrix was excised.  Fascia was elevated and peritoneal cavity  entered without difficulty and bowel packed away using a Dexterity  Protractor, a vinyl ring retractor, to allow optimal visualization.  The  round ligaments were quite thickened, taken down on either side with double  ligatures and transection, followed by isolation of the infundibulopelvic  ligaments on each side and clamping, cutting and suture ligating on either  side.  The patient then had isolation of the infundibulopelvic ligament on  the opposite side treated similarly.  We then proceeded to skeletonize the  uterine vessels on  either side, placed a single curved Heaney clamp over the  uterine vessels and a Kelly clamp placed for control of backbleeding.  A 0  chromic ligature was used to tie the uterine vessels followed by straight  Heaney clamps used to clamp the upper cardinal ligament, knife dissection  and 0 chromic suture ligature.  The lower cardinal ligaments were taken down  in a similar bite using the same suturing technique.  The uterosacral  ligaments were incorporated in this bite.  A single stab incision was then  made in the anterior cervical vaginal fornix and the cervical cuff amputated  off the cervix.  We then used four Kocher clamps to maintain orientation of  the cuff, placed an Aldridge stitch at each lateral cuff angle to improve  hemostasis and subsequent cuff support.  The cuff was closed in the midline  with a series of interrupted 0 chromic sutures with good tissue  approximation.  The Aldridge stitches were tied loosely into the midline.  Mid pelvis support was considered quite good and vaginal hemostasis seen.  Inspection of the pedicle confirmed hemostasis, and then the peritoneum  loosely reapproximated with three interrupted sutures of 2-0 chromic.  Pelvis was irrigated.  Laparotomy equipment removed, anterior peritoneum  closed using running 3-0 chromic, followed by running 0 Vicryl closure of  the fascia, interrupted 2-0 plain closure of the subcu spaces and staple  closure of the skin used to complete the procedure.  Estimated blood loss  100 cc.                                               Tilda Burrow, M.D.    JVF/MEDQ  D:  02/18/2003  T:  02/18/2003  Job:  [id number]   cc:   Milus Mallick. Lodema Hong, M.D.  [address]  [place], [place] [address]  Fax: [phone]   Ladona Horns. Neijstrom, MD  [address]  [place]  [place] [address]  Fax: [phone]

## 2011-03-05 NOTE — H&P (Signed)
NAME:  [name], [name]                        ACCOUNT NO.:  [id number]   MEDICAL RECORD NO.:  [id number]                   PATIENT TYPE:  AMB   LOCATION:  DAY                                  FACILITY:  [facility]   PHYSICIAN:  Tilda Burrow, M.D.              DATE OF BIRTH:  January 18, 1956   DATE OF ADMISSION:  DATE OF DISCHARGE:                                HISTORY & PHYSICAL   ADMISSION DIAGNOSES:  1. Menorrhagia.  2. Iron-deficiency anemia.  3. Uterine fibroids.  4. Severe alcoholism.  5. History of aneurysm resection from right frontal lobe of the brain,     residual right-sided weakness.  6. History of seizures.   HISTORY OF PRESENT ILLNESS:  [name] is a 55 year old patient of ours and we  share care with Dr. Syliva Overman and Dr. Glenford Peers of the [facility].  [name] has been referred to our office due to  severe iron-deficiency anemia which has been difficult to manage due to  menstrual blood loss. [name] has general medical illnesses including history  of alcoholism for years with prior diagnosis of cirrhosis related to the  alcoholism.  She has had endometrial biopsy, hysteroscopy and D&C performed  in 2000 by Dr. Mauri Reading due to persistent bleeding which showed  dyssynchronous endometrium.  There were no premalignant changes.  [name] is  admitted at this time for hysterectomy with removal of cervix, uterus,  tubes, and ovaries to resolve her heavy bleeding and to assist with  subsequent health care.  [name] has been followed in our office where  ultrasounds have shown uterine enlargement, estimated 150 g uterus,  irregular fibroids throughout the myometrium.   Most recently the patient had a six-day menses with three heavy days, two or  three light days.  She has had anemia with a hemoglobin as low as 8.1,  recently improved to hemoglobin 10.1 in response to iron therapy and  discontinuation of __________.   PAST MEDICAL HISTORY:   Extensive and well documented in Dr. Thornton Papas  notes.  Specifically notable for alcoholism for years, history of right  frontal lobe aneurysm requiring brain surgery at age 4, residual right-  sided weakness, seizure disorder secondary to aneurysm and subsequent  resection, peptic ulcer disease in the past, chronic esophageal irritation,  cigarette use intermittently x 30 years.   PAST SURGICAL HISTORY:  1. Aneurysm surgery.  2. Cesarean section x2.  3. Hysteroscopy and D&C in 1990.   DRUG ALLERGIES:  CODEINE which makes her confused.   MEDICATIONS:  1. Dilantin.  2. Phenobarbital.   PHYSICAL EXAMINATION:  GENERAL:  Somber, black female who appears older than  her stated age.  HEENT:  Pupils are equal, round and reactive.  Extraocular movements intact.  NECK:  Supple. Trachea midline.  CHEST:  Clear to auscultation.  CARDIAC:  Regular rate and rhythm.  LUNGS:  Clear.  ABDOMEN:  Well-healed lower abdominal scar.  EXTREMITIES:  Drooping of the right foot upon normal gait consistent with  prior medical history.  EXTERNAL GENITALIA:  Normal for age.  PELVIC:  Vaginal exam - recently treated for bacterial vaginitis with good  response to MetroGel.  Cervix - Pap smear Class I, January 2004.  Uterus -  anterior, irregular adnexa, fibroid enlargement.  No evidence of ovarian  cyst or malignancy, cysts or tumors.  RECTUM:  Negative for blood on last year's visit.   PLAN:  Extensive evaluation of medical condition and hysterectomy with  removal of ovaries through abdominal incision Feb 18, 2003.                                                Tilda Burrow, M.D.    JVF/MEDQ  D:  02/13/2003  T:  02/13/2003  Job:  [id number]   cc:   Milus Mallick. Lodema Hong, M.D.  [address]  [place], [place] [address]  Fax: [phone]   Ladona Horns. Neijstrom, MD  [address]  [place]  [place] [address]  Fax: [phone]

## 2011-03-05 NOTE — Discharge Summary (Signed)
NAME:  [name], [name] NO.:  [id number]   MEDICAL RECORD NO.:  [id number]          PATIENT TYPE:  IPS   LOCATION:  0300                          FACILITY:  [facility]   PHYSICIAN:  Jasmine Pang, M.D. DATE OF BIRTH:  1956/06/03   DATE OF ADMISSION:  12/04/2008  DATE OF DISCHARGE:  12/06/2008                               DISCHARGE SUMMARY   IDENTIFICATION:  This is a 55 year old single female who was admitted on  a voluntary basis on December 04, 2008.   HISTORY OF PRESENT ILLNESS:  The patient states she is here to try  out.  She has been drinking an unknown amount of alcohol on fairly  consistent basis with her last drink being 2 days ago.  She denies any  depression or suicidal thoughts.  She reports no significant stressors.  She had a history of a CVA at the age of 69, but is able to perform all  ADLs.  She has been sleeping adequately and appetite has been  satisfactory.   PAST PSYCHIATRIC HISTORY:  This is the first admission to [facility].  She was at [facility] in the past for alcohol abuse.   FAMILY HISTORY:  Unknown.   ALCOHOL AND DRUG HISTORY:  She has history of seizure activity, although  not related to alcohol use.  No blackouts.   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   MEDICATIONS:  1. Phenobarbital 50 mg b.i.d.  2. Dilantin 300 mg at bedtime.   DRUG ALLERGIES:  PENICILLIN and CODEINE.   PHYSICAL FINDINGS:  There were no acute physical or medical problems  noted.  She appeared in no acute distress.   LABORATORY DATA:  Shows a normal EKG.  Acetaminophen level of less than  10, glucose was elevated at 114, alcohol level was 349.  Dilantin level  less than 2.5, salicylate level less than 4, RBC count is 3.80.   HOSPITAL COURSE:  Upon admission, the patient was restarted on Dilantin  100 mg p.o. t.i.d. and Ambien 5 mg p.o. 1-2 pills at bedtime.  She was  also started on  Librium detox protocol, phenobarbital 15 mg p.o. b.i.d.,  and Pepcid 20 mg p.o. b.i.d.  In individual sessions, the patient was  cooperative.  She admits she has been drinking daily.  She went to [facility] for some help.  They referred her to [place].  She denied any  drug use.  She stated she had no psychiatrist at this point.  She lives  by herself, but has a supportive brother.  She just kicked her boyfriend  of 9 years and out of the house because of his drinking.  On December 06, 2008, mental status had improved markedly from admission status.  Mood was less depressed, less anxious.  Affect was consistent with mood.  There was no suicidal or homicidal ideation.  No thoughts of self-  injurious behavior.  No auditory or visual hallucinations.  No paranoia  or delusions.  Thoughts were logical and goal-directed.  Thought  content,  no predominant theme.  Cognitive was grossly intact.  Insight  good.  Judgment good.  The patient wanted to go home today and was felt  to be safe for discharge.   DISCHARGE DIAGNOSES:  Axis I: Alcohol dependence.  Axis II: None.  Axis III: Hypertension, seizure disorder, history of cerebrovascular  accident, and ulcers.  Axis IV: Severe (problems with primary support group, especially  boyfriend, medical problems, and chronic substance use.).  Axis V: Global assessment of functioning was 55 upon discharge.  GAF was  45 highest upon admission.  GAF was 65-70 highest past year.   DISCHARGE PLAN:  There was no activity level or dietary restriction.   POSTHOSPITAL CARE PLANS:  The patient will go to [facility] [place] on  December 13, 2008 at 8 a.m.   DISCHARGE MEDICATIONS:  1. Dilantin 100 mg 3 times a day.  2. Phenobarbital 15 mg twice a day.  3. Librium 25 mg 1 in the afternoon and 1 at bedtime on December 06, 2008.  Librium 25 mg 1 in the a.m. and 1 at 5:00 p.m. on December 07, 2008.  Librium 25 mg 1 in a.m. on December 08, 2008,  then stop      and detox will be complete.   She is also to return to see Dr. Felecia Shelling for her medical problems.      Jasmine Pang, M.D.  Electronically Signed     BHS/MEDQ  D:  12/26/2008  T:  12/27/2008  Job:  [id number]

## 2011-03-05 NOTE — Op Note (Signed)
NAME:  [name], [name] NO.:  [id number]   MEDICAL RECORD NO.:  [id number]          PATIENT TYPE:  AMB   LOCATION:  DAY                           FACILITY:  [facility]   PHYSICIAN:  Leroy C. Katrinka Blazing, M.D.   DATE OF BIRTH:  [birth year]   DATE OF PROCEDURE:  06/05/2004  DATE OF DISCHARGE:  06/05/2004                                 OPERATIVE REPORT   PREOPERATIVE DIAGNOSIS:  Anterior abdominal wall mass.   POSTOPERATIVE DIAGNOSIS:  Anterior abdominal wall mass.   OPERATION/PROCEDURE:  Full-thickness excision of anterior abdominal wall  mass, 6 cm.   SURGEON:  Dirk Dress. Katrinka Blazing, M.D.   DESCRIPTION OF PROCEDURE:  Under general anesthesia, the patient's abdomen  was prepped and draped in a sterile field.  A circumferential, elliptical  incision was made around the mass and extended through the skin and  subcutaneous fat and down to the fascia.  The mass was totally excised and  sent for pathology evaluation.  The tissues were closed in layers using 2-0  Monocryl, 3-0 Monocryl and staples.  Dressing was placed.  The patient was  awakened from anesthesia uneventfully.  She was transferred to a bed and  taken to the post anesthesia care unit in satisfactory condition.      LCS/MEDQ  D:  07/19/2004  T:  07/19/2004  Job:  [id number]

## 2011-03-05 NOTE — H&P (Signed)
NAME:  DAWKINSDayjah, Selman                        ACCOUNT NO.:  [id number]   MEDICAL RECORD NO.:  [id number]                   PATIENT TYPE:  AMB   LOCATION:  DAY                                  FACILITY:  [facility]   PHYSICIAN:  Jerolyn Shin C. Katrinka Blazing, M.D.                DATE OF BIRTH:  [birth year]   DATE OF ADMISSION:  DATE OF DISCHARGE:                                HISTORY & PHYSICAL   A 55 year old female with history of a mass of the anterior abdominal wall  to the right of the umbilicus.  It has been present probably for about two  months.  This gradually increased in size.  It appeared to be inflammatory  initially.  She was treated with antibiotics for two weeks without  improvement, and the mass actually increased in size.  Because of increasing  size and its fixation to the abdominal wall, she is scheduled to have it  removed in the operating room.   PAST MEDICAL HISTORY:  1. She has history of a seizure disorder since she had aneurysm resection at     age 74.  2. She has a chronic right hemiparesis because of that surgery.  3. She has long history of alcoholism and has had multiple admissions for     alcohol-related problems.  4. Hypertension.  5. Peptic ulcer disease.   ALLERGIES:  No known drug allergies.   PAST SURGICAL HISTORY:  1. C section.  2. Hysterectomy.   MEDICATIONS:  1. Dilantin 100 mg 3 q.h.s.  2. Phenobarbital 30 mg b.i.d.  3. Doxycycline 100 mg b.i.d.  4. Enalapril 5 mg daily.  5. Aciphex 20 mg daily.   SOCIAL HISTORY:  She is single, gravida 5, para 3, AB 2.  She is medically  disabled.  Long history of alcohol and cigarette abuse, and she denies drug  use.   PHYSICAL EXAMINATION:  VITAL SIGNS:  Blood pressure 148/88, pulse 78,  respirations 20.  HEENT:  Unremarkable except for poor dentition.  She does have mild right  facial weakness.  NECK:  Supple, no JVD or bruit, adenopathy or thyromegaly.  CHEST:  Clear to auscultation.  HEART:  Regular rate and  rhythm without murmur, gallop, or rub.  ABDOMEN:  Firm, 4 cm mass to the right of the midline near the umbilicus,  partially fixed to the surrounding tissue with some skin dimpling.  It is  not tender.  EXTREMITIES:  No cyanosis, clubbing, or edema.  NEUROLOGIC:  Right upper and right lower extremity weakness with spastic  paresis.   IMPRESSION:  1. Inflammatory mass of anterior abdominal wall.  2. Hypertension.  3. Seizure disorder.  4. Chronic alcoholism.  5. Peptic ulcer disease.  6. Right hemiparesis.   PLAN:  The patient is scheduled to have mass excised under anesthesia.     ___________________________________________  Dirk Dress. Katrinka Blazing, M.D.   LCS/MEDQ  D:  06/04/2004  T:  06/05/2004  Job:  [id number]

## 2011-07-22 LAB — GLUCOSE, CAPILLARY: Glucose-Capillary: 145 mg/dL — ABNORMAL HIGH (ref 70–99)

## 2011-07-22 LAB — PHENYTOIN LEVEL, TOTAL: Phenytoin Lvl: 7.4 ug/mL — ABNORMAL LOW (ref 10.0–20.0)

## 2011-07-30 LAB — CBC
HCT: 38.1
Hemoglobin: 12.9
MCHC: 33.9
MCV: 92.8
Platelets: 248
RBC: 4.11
RDW: 12.9
WBC: 7.2

## 2011-07-30 LAB — BASIC METABOLIC PANEL
BUN: 5 — ABNORMAL LOW
CO2: 26
Calcium: 9.6
Chloride: 95 — ABNORMAL LOW
Creatinine, Ser: 0.41
GFR calc Af Amer: >60
GFR calc non Af Amer: >60
Glucose, Bld: 80
Potassium: 3.8
Sodium: 132 — ABNORMAL LOW

## 2011-07-30 LAB — URINALYSIS, ROUTINE W REFLEX MICROSCOPIC
Bilirubin Urine: NEGATIVE
Glucose, UA: NEGATIVE
Hgb urine dipstick: NEGATIVE
Ketones, ur: NEGATIVE
Nitrite: NEGATIVE
Protein, ur: NEGATIVE
Specific Gravity, Urine: 1.015
Urobilinogen, UA: 0.2
pH: 6

## 2011-07-30 LAB — RAPID URINE DRUG SCREEN, HOSP PERFORMED
Amphetamines: NOT DETECTED
Barbiturates: POSITIVE — AB
Benzodiazepines: NOT DETECTED
Cocaine: NOT DETECTED
Opiates: NOT DETECTED
Tetrahydrocannabinol: NOT DETECTED

## 2011-07-30 LAB — HEMOGLOBIN AND HEMATOCRIT, BLOOD
HCT: 39.2
Hemoglobin: 13.1

## 2011-07-30 LAB — PHENOBARBITAL LEVEL: Phenobarbital: 8 — ABNORMAL LOW

## 2011-07-30 LAB — ETHANOL: Alcohol, Ethyl (B): 220 — ABNORMAL HIGH

## 2011-07-30 LAB — PHENYTOIN LEVEL, TOTAL: Phenytoin Lvl: <2.5 — ABNORMAL LOW

## 2012-01-06 ENCOUNTER — Encounter (HOSPITAL_COMMUNITY): Payer: Self-pay | Admitting: Emergency Medicine

## 2012-01-06 ENCOUNTER — Emergency Department (HOSPITAL_COMMUNITY)
Admission: EM | Admit: 2012-01-06 | Discharge: 2012-01-06 | Disposition: A | Discharge status: Home / Self Care | Payer: Medicare Other | Attending: Emergency Medicine | Admitting: Emergency Medicine

## 2012-01-06 ENCOUNTER — Other Ambulatory Visit: Payer: Self-pay

## 2012-01-06 ENCOUNTER — Emergency Department (HOSPITAL_COMMUNITY): Payer: Medicare Other

## 2012-01-06 DIAGNOSIS — F1010 Alcohol abuse, uncomplicated: Secondary | ICD-10-CM | POA: Insufficient documentation

## 2012-01-06 DIAGNOSIS — K5900 Constipation, unspecified: Secondary | ICD-10-CM | POA: Insufficient documentation

## 2012-01-06 DIAGNOSIS — Z8673 Personal history of transient ischemic attack (TIA), and cerebral infarction without residual deficits: Secondary | ICD-10-CM | POA: Insufficient documentation

## 2012-01-06 DIAGNOSIS — R109 Unspecified abdominal pain: Secondary | ICD-10-CM | POA: Insufficient documentation

## 2012-01-06 HISTORY — DX: Cerebral infarction, unspecified: I63.9

## 2012-01-06 HISTORY — DX: Unspecified convulsions: R56.9

## 2012-01-06 LAB — COMPREHENSIVE METABOLIC PANEL
ALT: 35 U/L (ref 0–35)
AST: 47 U/L — ABNORMAL HIGH (ref 0–37)
Albumin: 4.2 g/dL (ref 3.5–5.2)
Alkaline Phosphatase: 145 U/L — ABNORMAL HIGH (ref 39–117)
BUN: 9 mg/dL (ref 6–23)
CO2: 25 mEq/L (ref 19–32)
Calcium: 9.9 mg/dL (ref 8.4–10.5)
Chloride: 94 mEq/L — ABNORMAL LOW (ref 96–112)
Creatinine, Ser: 0.48 mg/dL — ABNORMAL LOW (ref 0.50–1.10)
GFR calc Af Amer: >90 mL/min (ref >90)
GFR calc non Af Amer: >90 mL/min (ref >90)
Glucose, Bld: 97 mg/dL (ref 70–99)
Potassium: 3.3 mEq/L — ABNORMAL LOW (ref 3.5–5.1)
Sodium: 133 mEq/L — ABNORMAL LOW (ref 135–145)
Total Bilirubin: 0.2 mg/dL — ABNORMAL LOW (ref 0.3–1.2)
Total Protein: 8.5 g/dL — ABNORMAL HIGH (ref 6.0–8.3)

## 2012-01-06 LAB — ETHANOL: Alcohol, Ethyl (B): 265 mg/dL — ABNORMAL HIGH (ref 0–11)

## 2012-01-06 LAB — CBC
HCT: 35.5 % — ABNORMAL LOW (ref 36.0–46.0)
Hemoglobin: 12.1 g/dL (ref 12.0–15.0)
MCH: 31.3 pg (ref 26.0–34.0)
MCHC: 34.1 g/dL (ref 30.0–36.0)
MCV: 92 fL (ref 78.0–100.0)
Platelets: 200 10*3/uL (ref 150–400)
RBC: 3.86 MIL/uL — ABNORMAL LOW (ref 3.87–5.11)
RDW: 12.9 % (ref 11.5–15.5)
WBC: 6.2 10*3/uL (ref 4.0–10.5)

## 2012-01-06 LAB — DIFFERENTIAL
Basophils Absolute: 0 10*3/uL (ref 0.0–0.1)
Basophils Relative: 1 % (ref 0–1)
Eosinophils Absolute: 0 10*3/uL (ref 0.0–0.7)
Eosinophils Relative: 0 % (ref 0–5)
Lymphocytes Relative: 38 % (ref 12–46)
Lymphs Abs: 2.4 10*3/uL (ref 0.7–4.0)
Monocytes Absolute: 0.4 10*3/uL (ref 0.1–1.0)
Monocytes Relative: 7 % (ref 3–12)
Neutro Abs: 3.4 10*3/uL (ref 1.7–7.7)
Neutrophils Relative %: 54 % (ref 43–77)

## 2012-01-06 LAB — LIPASE, BLOOD: Lipase: 39 U/L (ref 11–59)

## 2012-01-06 LAB — RAPID URINE DRUG SCREEN, HOSP PERFORMED
Amphetamines: NOT DETECTED
Barbiturates: POSITIVE — AB
Benzodiazepines: NOT DETECTED
Cocaine: NOT DETECTED
Opiates: NOT DETECTED
Tetrahydrocannabinol: NOT DETECTED

## 2012-01-06 MED ORDER — SODIUM CHLORIDE 0.9 % IV SOLN
Freq: Once | INTRAVENOUS | Status: AC
  Administered 2012-01-06: 22:00:00 via INTRAVENOUS

## 2012-01-06 NOTE — ED Notes (Signed)
Patient discharged home in the care of family member, Archie Patten.

## 2012-01-06 NOTE — Discharge Instructions (Signed)

## 2012-01-06 NOTE — ED Notes (Signed)
Please call family at [phone] for pickup when ready to be discharged. [name]

## 2012-01-06 NOTE — ED Notes (Signed)
Patient requesting something to eat.

## 2012-01-06 NOTE — ED Notes (Signed)
Patient to bathroom in the middle of triage.

## 2012-01-06 NOTE — ED Notes (Signed)
Patient presents to ER via EMS with c/o abdominal pain and constipation x 4 days.  Patient states has been drinking liquor and beer today with family.

## 2012-01-06 NOTE — ED Provider Notes (Signed)
History   This chart was scribed for Lauren Baker, MD scribed by Magnus Sinning. The patient was seen in room APA18/APA18 seen at 20:26    CSN: [id number]  Arrival date & time 01/06/12  2006   First MD Initiated Contact with Patient 01/06/12 2023      Chief Complaint  Patient presents with  . Abdominal Pain  . Constipation    (Consider location/radiation/quality/duration/timing/severity/associated sxs/prior treatment) HPI [name] is a 56 y.o. female who presents to the Emergency Department BIB EMS complaining of constant moderate abd pain,onset 4 days, with associated constipation, occasional pain with urination, subjective fever, and vomiting. She says she's had one episode of emesis today, but adds she has been consuming beer today, and is unsure of the total amount consumed. She denies drinking everyday, drug use, or any other aggravating factors.  Pt has a hx a hysterectomy.  Past Medical History  Diagnosis Date  . Stroke   . Seizures     History reviewed. No pertinent past surgical history.  No family history on file.  History  Substance Use Topics  . Smoking status: Current Some Day Smoker  . Smokeless tobacco: Not on file  . Alcohol Use: Yes     2 beers and several mixed drinks today    Review of Systems  Gastrointestinal: Positive for vomiting, abdominal pain and constipation.  All other systems reviewed and are negative.  10 Systems reviewed and are negative for acute change except as noted in the HPI. Allergies  Review of patient's allergies indicates no known allergies.  Home Medications  No current outpatient prescriptions on file.  BP 110/51  Pulse 95  Temp(Src) 97.9 F (36.6 C) (Oral)  Ht 5\' 4"  (1.626 m)  Wt 148 lb (67.132 kg)  BMI 25.40 kg/m2  SpO2 100%  Physical Exam  Nursing note and vitals reviewed. Constitutional: She is oriented to person, place, and time. She appears well-developed and well-nourished. No distress.  HENT:    Head: Normocephalic and atraumatic.  Eyes: EOM are normal. Pupils are equal, round, and reactive to light.  Neck: Neck supple. No tracheal deviation present.  Cardiovascular: Normal rate.   Pulmonary/Chest: Effort normal. No respiratory distress.  Abdominal: Soft. She exhibits no distension. There is no tenderness. There is no rebound and no guarding.  Musculoskeletal: Normal range of motion. She exhibits no edema.  Neurological: She is alert and oriented to person, place, and time. No sensory deficit.  Skin: Skin is warm and dry.  Psychiatric: Her behavior is normal. Her affect is blunt.    ED Course  Procedures (including critical care time) DIAGNOSTIC STUDIES: Oxygen Saturation is 100% on room air, normal by my interpretation.    COORDINATION OF CARE:  Medication Orders 20:30: 0.9 % sodium chloride infusion Once  Labs Reviewed  CBC - Abnormal; Notable for the following:    RBC 3.86 (*)    HCT 35.5 (*)    All other components within normal limits  COMPREHENSIVE METABOLIC PANEL - Abnormal; Notable for the following:    Sodium 133 (*)    Potassium 3.3 (*)    Chloride 94 (*)    Creatinine, Ser 0.48 (*)    Total Protein 8.5 (*)    AST 47 (*)    Alkaline Phosphatase 145 (*)    Total Bilirubin 0.2 (*)    All other components within normal limits  ETHANOL - Abnormal; Notable for the following:    Alcohol, Ethyl (B) 265 (*)  All other components within normal limits  URINE RAPID DRUG SCREEN (HOSP PERFORMED) - Abnormal; Notable for the following:    Barbiturates POSITIVE (*)    All other components within normal limits  DIFFERENTIAL  LIPASE, BLOOD   Dg Abd Acute W/chest  01/06/2012  *RADIOLOGY REPORT*  Clinical Data: Abdominal pain.  Constipation.  ACUTE ABDOMEN SERIES (ABDOMEN 2 VIEW & CHEST 1 VIEW)  Comparison: Single view chest 10/10/2010.  Findings: The heart size is normal.  The lungs are clear.  Supine and upright views the abdomen demonstrate nonspecific bowel gas  pattern.  Degenerative changes are noted in the lumbar spine.  IMPRESSION:  1.  No acute cardiopulmonary disease. 2.  No acute abnormality of the abdomen. 3.  Degenerative changes in the lower lumbar spine.  Original Report Authenticated By: Jamesetta Orleans. MATTERN, M.D.     No diagnosis found.   MDM  9:51 PM \\Rectal  exam performed and neg, pts etoh level noted--repeat abd exam remains nl, pt will wait until morning due to lack of transportation I personally performed the services described in this documentation, which was scribed in my presence. The recorded information has been reviewed and considered.          Lauren Baker, MD 01/06/12 2227

## 2012-07-27 ENCOUNTER — Telehealth: Payer: Self-pay

## 2012-07-27 NOTE — Telephone Encounter (Signed)
Opened in error

## 2012-08-10 ENCOUNTER — Telehealth: Payer: Self-pay

## 2012-08-10 NOTE — Telephone Encounter (Signed)
Called. Recording said not accepting calls at this time.

## 2012-08-29 NOTE — Telephone Encounter (Signed)
Called number listed of [phone]. Someone told me to call Renee at [phone] and she could give me pt's new number. She gave me pt's cell number of [phone]. I will correct the info on her chart.

## 2012-08-29 NOTE — Telephone Encounter (Signed)
Pt has some diarrhea and is scheduled an OV on 09/05/2012 at 9:00 AM with Tana Coast, PA.

## 2012-09-05 ENCOUNTER — Encounter: Payer: Self-pay | Admitting: Gastroenterology

## 2012-09-05 ENCOUNTER — Ambulatory Visit (INDEPENDENT_AMBULATORY_CARE_PROVIDER_SITE_OTHER): Payer: Medicare Other | Admitting: Gastroenterology

## 2012-09-05 VITALS — BP 169/87 | HR 95 | Temp 97.6°F | Ht 64.0 in | Wt 142.6 lb

## 2012-09-05 DIAGNOSIS — R1110 Vomiting, unspecified: Secondary | ICD-10-CM

## 2012-09-05 DIAGNOSIS — R1314 Dysphagia, pharyngoesophageal phase: Secondary | ICD-10-CM

## 2012-09-05 DIAGNOSIS — R1310 Dysphagia, unspecified: Secondary | ICD-10-CM | POA: Insufficient documentation

## 2012-09-05 DIAGNOSIS — R1010 Upper abdominal pain, unspecified: Secondary | ICD-10-CM

## 2012-09-05 DIAGNOSIS — R1319 Other dysphagia: Secondary | ICD-10-CM

## 2012-09-05 DIAGNOSIS — K529 Noninfective gastroenteritis and colitis, unspecified: Secondary | ICD-10-CM

## 2012-09-05 DIAGNOSIS — F1010 Alcohol abuse, uncomplicated: Secondary | ICD-10-CM | POA: Insufficient documentation

## 2012-09-05 DIAGNOSIS — R197 Diarrhea, unspecified: Secondary | ICD-10-CM

## 2012-09-05 DIAGNOSIS — K219 Gastro-esophageal reflux disease without esophagitis: Secondary | ICD-10-CM

## 2012-09-05 DIAGNOSIS — R109 Unspecified abdominal pain: Secondary | ICD-10-CM | POA: Insufficient documentation

## 2012-09-05 DIAGNOSIS — Z8719 Personal history of other diseases of the digestive system: Secondary | ICD-10-CM

## 2012-09-05 MED ORDER — PEG 3350-KCL-NA BICARB-NACL 420 G PO SOLR: 4000.0000 mL | ORAL | Status: DC

## 2012-09-05 NOTE — Patient Instructions (Addendum)
We have scheduled you for an upper endoscopy and colonoscopy with Dr. Fields. Please see separate instructions. 

## 2012-09-05 NOTE — Progress Notes (Signed)
Primary Care Physician:  Avon Gully, MD  Primary Gastroenterologist:  Jonette Eva, MD   Chief Complaint  Patient presents with  . Diarrhea    consult for colonoscopy    HPI:  [name] is a 56 y.o. female here to schedule colonoscopy at request of Dr. Felecia Shelling. She has h/o chronic diarrhea. BM up to 3 stools daily. Most of her stools are loose. Occasional brbpr but none recently. C/O refractory heartburn on omeprazole. Some solid food esophageal dysphagia. Several episodes of vomiting per week. No hematemesis lately. No melena. No significant weight loss.   Patient is poor historian. 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 Went to rehab before but only quit etoh for few weeks. Is not interested in quitting at this time. Does not give much knowledge regarding stroke as child nor surgery she had. States she has h/o PUD in the past but again unable to give any details.   Current Outpatient Prescriptions  Medication Sig Dispense Refill  . albuterol (PROVENTIL HFA;VENTOLIN HFA) 108 (90 BASE) MCG/ACT inhaler Inhale 2 puffs into the lungs every 6 (six) hours as needed. FOR SHORTNESS OF BREATH AND/OR WHEEZING      . amLODipine (NORVASC) 5 MG tablet Take 5 mg by mouth daily.      Marland Kitchen lisinopril-hydrochlorothiazide (PRINZIDE,ZESTORETIC) 20-12.5 MG per tablet Take 1 tablet by mouth daily.      Marland Kitchen omeprazole (PRILOSEC) 20 MG capsule Take 20 mg by mouth daily.      Marland Kitchen PHENobarbital (LUMINAL) 32.4 MG tablet 32.4 mg daily.      Marland Kitchen Phenytoin (DILANTIN PO) Take 100 mg by mouth 3 (three) times daily.       .         Allergies as of 09/05/2012  . (No Known Allergies)    Past Medical History  Diagnosis Date  . Stroke     age 17, required brain surgery  . Seizures     since stroke, no recent seizures  . HTN (hypertension)   . PUD (peptic ulcer disease)     remote    Past Surgical History  Procedure Date  . Abdominal hysterectomy 2004    complete  . Right tib-fib fracture 2008  . Brain surgery     age 29, stroke    Family History  Problem Relation Age of Onset  . Colon cancer Neg Hx   . Liver disease Sister     etoh    History   Social History  . Marital Status: Single    Spouse Name: N/A    Number of Children: 3  . Years of Education: N/A   Occupational History  . disability    Social History Main Topics  . Smoking status: Current Some Day Smoker  . Smokeless tobacco: Not on file     Comment: 1-2 cigaretts daily  . Alcohol Use: Yes     Comment: 12 beers and several mixed drinks daily, rehab previously (quit for several weeks at a time), daily drinker since age 41s  . Drug Use: No  . Sexually Active: Not on file   Other Topics Concern  . Not on file   Social History Narrative  . No narrative on file      ROS:  General: Negative for anorexia, weight loss, fever, chills, fatigue, weakness. Eyes: Negative for vision changes.  ENT: Negative for hoarseness, nasal congestion. See HPI. CV: Negative for chest pain, angina, palpitations, dyspnea on exertion, peripheral edema.  Respiratory: Negative for dyspnea at rest,  dyspnea on exertion, cough, sputum, wheezing.  GI: See history of present illness. GU:  Negative for dysuria, hematuria, urinary incontinence, urinary frequency, nocturnal urination.  MS: Negative for joint pain, low back pain.  Derm: Negative for rash or itching.  Neuro: Positive for left sided arm/leg weakness since remote CVA. H/O seizure none recently. No frequent headaches, memory loss, confusion.  Psych: Negative for anxiety, depression, suicidal ideation, hallucinations.  Endo: Negative for unusual weight change.  Heme: Negative for bruising or bleeding. Allergy: Negative for rash or hives.    Physical Examination:  BP 169/87  Pulse 95  Temp 97.6 F (36.4 C) (Tympanic)  Ht 5\' 4"  (1.626 m)  Wt 142 lb 9.6 oz (64.683 kg)  BMI 24.48 kg/m2   General: Chronically ill-appearing AA female, in NAD.  Head: Normocephalic, atraumatic.     Eyes: Conjunctiva pink, no icterus. Mouth: Oropharyngeal mucosa moist and pink , no lesions erythema or exudate. Dentition in poor repair. Neck: Supple without thyromegaly, masses, or lymphadenopathy.  Lungs: Clear to auscultation bilaterally.  Heart: Regular rate and rhythm, no murmurs rubs or gallops.  Abdomen: Bowel sounds are normal, nontender, nondistended. Full in RUQ, ?hepatomegaly. No splenomegaly or masses, no abdominal bruits or    hernia , no rebound or guarding.   Rectal: defer Extremities: No lower extremity edema. No clubbing or deformities.  Neuro: Alert and oriented x 4 , grossly normal neurologically.  Skin: Warm and dry, no rash or jaundice.   Psych: Alert and cooperative, normal mood and affect.  Labs: Old labs from 12/2011.  Lab Results  Component Value Date   WBC 6.2 01/06/2012   HGB 12.1 01/06/2012   HCT 35.5* 01/06/2012   MCV 92.0 01/06/2012   PLT 200 01/06/2012   Lab Results  Component Value Date   ALT 35 01/06/2012   AST 47* 01/06/2012   ALKPHOS 145* 01/06/2012   BILITOT 0.2* 01/06/2012   Lab Results  Component Value Date   CREATININE 0.48* 01/06/2012   BUN 9 01/06/2012   NA 133* 01/06/2012   K 3.3* 01/06/2012   CL 94* 01/06/2012   CO2 25 01/06/2012   Lab Results  Component Value Date   LIPASE 39 01/06/2012   Alcohol level: 265   Imaging Studies: No liver imaging within Epic.

## 2012-09-05 NOTE — Progress Notes (Signed)
Faxed to PCP

## 2012-09-05 NOTE — Assessment & Plan Note (Signed)
Numerous UGI complaints in setting of etoh abuse, H/O remote PUD, GERD. She has refractory GERD, solid food esophageal dysphagia, epigastric/ruq pain, intermittent vomiting. First would recommend EGD/ED (with deep sedation in OR given chronic etoh abuse) to evaluate for complicated GERD, PUD, etc. Based on findings patient may need gallbladder evaluation. Would consider abdominal u/s and update labs given ?hepatomegaly on exam and chronic etoh use. Needs to be evaluated for cirrhosis. Await EGD findings.  I have discussed the risks, alternatives, benefits with regards to but not limited to the risk of reaction to medication, bleeding, infection, perforation and the patient is agreeable to proceed. Written consent to be obtained.

## 2012-09-05 NOTE — Assessment & Plan Note (Signed)
Chronic diarrhea with intermittent brbpr in setting of etoh abuse. ?etoh colopathy? DDx: also includes microscopic colitis, IBD, IBD, malignancy. No prior colonoscopy. Recommend colonoscopy in near future with deep sedation in the OR secondary to etoh abuse.  I have discussed the risks, alternatives, benefits with regards to but not limited to the risk of reaction to medication, bleeding, infection, perforation and the patient is agreeable to proceed. Written consent to be obtained.

## 2012-09-11 ENCOUNTER — Encounter (HOSPITAL_COMMUNITY): Payer: Self-pay | Admitting: Pharmacy Technician

## 2012-09-25 NOTE — Patient Instructions (Signed)
20 [name]  09/25/2012   Your procedure is scheduled on:  10/03/12  Report to [facility] at 07:15 AM.  Call this number if you have problems the morning of surgery: [phone]   Remember:   Do not eat ro drink:After Midnight.   Take these medicines the morning of surgery with A SIP OF WATER: Amlodipine, Lisinopril-HCTZ, Phenobarbital and Phenytoin. Also, use your Albuterol before coming.   Do not wear jewelry, make-up or nail polish.  Do not wear lotions, powders, or perfumes.   Do not shave 48 hours prior to surgery. Men may shave face and neck.  Do not bring valuables to the hospital.  Contacts, dentures or bridgework may not be worn into surgery.  Leave suitcase in the car. After surgery it may be brought to your room.  For patients admitted to the hospital, checkout time is 11:00 AM the day of discharge.   Patients discharged the day of surgery will not be allowed to drive home.   Special Instructions: Start your bowel prep as directed by your GI doctor.   Please read over the following fact sheets that you were given: Pain Booklet, Anesthesia Post-op Instructions and Care and Recovery After Surgery    Esophagogastroduodenoscopy This is an endoscopic procedure (a procedure that uses a device like a flexible telescope) that allows your caregiver to view the upper stomach and small bowel. This test allows your caregiver to look at the esophagus. The esophagus carries food from your mouth to your stomach. They can also look at your duodenum. This is the first part of the small intestine that attaches to the stomach. This test is used to detect problems in the bowel such as ulcers and inflammation. PREPARATION FOR TEST Nothing to eat after midnight the day before the test. NORMAL FINDINGS Normal esophagus, stomach, and duodenum. Ranges for normal findings may vary among different laboratories and hospitals. You should always check with your doctor after having lab work or  other tests done to discuss the meaning of your test results and whether your values are considered within normal limits. MEANING OF TEST  Your caregiver will go over the test results with you and discuss the importance and meaning of your results, as well as treatment options and the need for additional tests if necessary. OBTAINING THE TEST RESULTS It is your responsibility to obtain your test results. Ask the lab or department performing the test when and how you will get your results. Document Released: 02/04/2005 Document Revised: 12/27/2011 Document Reviewed: 09/13/2008  [facility] Patient Information 2013 [place], [place].    Colonoscopy A colonoscopy is an exam to evaluate your entire colon. In this exam, your colon is cleansed. A long fiberoptic tube is inserted through your rectum and into your colon. The fiberoptic scope (endoscope) is a long bundle of enclosed and very flexible fibers. These fibers transmit light to the area examined and send images from that area to your caregiver. Discomfort is usually minimal. You may be given a drug to help you sleep (sedative) during or prior to the procedure. This exam helps to detect lumps (tumors), polyps, inflammation, and areas of bleeding. Your caregiver may also take a small piece of tissue (biopsy) that will be examined under a microscope. LET YOUR CAREGIVER KNOW ABOUT:   Allergies to food or medicine.  Medicines taken, including vitamins, herbs, eyedrops, over-the-counter medicines, and creams.  Use of steroids (by mouth or creams).  Previous problems with anesthetics or numbing medicines.  History of bleeding  problems or blood clots.  Previous surgery.  Other health problems, including diabetes and kidney problems.  Possibility of pregnancy, if this applies. BEFORE THE PROCEDURE   A clear liquid diet may be required for 2 days before the exam.  Ask your caregiver about changing or stopping your regular medications.  Liquid  injections (enemas) or laxatives may be required.  A large amount of electrolyte solution may be given to you to drink over a short period of time. This solution is used to clean out your colon.  You should be present 60 minutes prior to your procedure or as directed by your caregiver. AFTER THE PROCEDURE   If you received a sedative or pain relieving medication, you will need to arrange for someone to drive you home.  Occasionally, there is a little blood passed with the first bowel movement. Do not be concerned. FINDING OUT THE RESULTS OF YOUR TEST Not all test results are available during your visit. If your test results are not back during the visit, make an appointment with your caregiver to find out the results. Do not assume everything is normal if you have not heard from your caregiver or the medical facility. It is important for you to follow up on all of your test results. HOME CARE INSTRUCTIONS   It is not unusual to pass moderate amounts of gas and experience mild abdominal cramping following the procedure. This is due to air being used to inflate your colon during the exam. Walking or a warm pack on your belly (abdomen) may help.  You may resume all normal meals and activities after sedatives and medicines have worn off.  Only take over-the-counter or prescription medicines for pain, discomfort, or fever as directed by your caregiver. Do not use aspirin or blood thinners if a biopsy was taken. Consult your caregiver for medicine usage if biopsies were taken. SEEK IMMEDIATE MEDICAL CARE IF:   You have a fever.  You pass large blood clots or fill a toilet with blood following the procedure. This may also occur 10 to 14 days following the procedure. This is more likely if a biopsy was taken.  You develop abdominal pain that keeps getting worse and cannot be relieved with medicine. Document Released: 10/01/2000 Document Revised: 12/27/2011 Document Reviewed: 05/16/2008 [facility]  Patient Information 2013 [place], [place].    PATIENT INSTRUCTIONS POST-ANESTHESIA  IMMEDIATELY FOLLOWING SURGERY:  Do not drive or operate machinery for the first twenty four hours after surgery.  Do not make any important decisions for twenty four hours after surgery or while taking narcotic pain medications or sedatives.  If you develop intractable nausea and vomiting or a severe headache please notify your doctor immediately.  FOLLOW-UP:  Please make an appointment with your surgeon as instructed. You do not need to follow up with anesthesia unless specifically instructed to do so.  WOUND CARE INSTRUCTIONS (if applicable):  Keep a dry clean dressing on the anesthesia/puncture wound site if there is drainage.  Once the wound has quit draining you may leave it open to air.  Generally you should leave the bandage intact for twenty four hours unless there is drainage.  If the epidural site drains for more than 36-48 hours please call the anesthesia department.  QUESTIONS?:  Please feel free to call your physician or the hospital operator if you have any questions, and they will be happy to assist you.

## 2012-09-26 ENCOUNTER — Encounter (HOSPITAL_COMMUNITY): Admission: RE | Admit: 2012-09-26 | Discharge: 2012-09-26 | Payer: Medicare Other | Source: Ambulatory Visit

## 2012-10-03 ENCOUNTER — Encounter (HOSPITAL_COMMUNITY): Payer: Self-pay | Admitting: Anesthesiology

## 2012-10-03 ENCOUNTER — Encounter (HOSPITAL_COMMUNITY)
Admission: RE | Disposition: A | Discharge status: Home / Self Care | Payer: Self-pay | Source: Ambulatory Visit | Attending: Gastroenterology

## 2012-10-03 ENCOUNTER — Encounter (HOSPITAL_COMMUNITY): Payer: Self-pay | Admitting: *Deleted

## 2012-10-03 ENCOUNTER — Ambulatory Visit (HOSPITAL_COMMUNITY): Payer: Medicare Other | Admitting: Anesthesiology

## 2012-10-03 ENCOUNTER — Ambulatory Visit (HOSPITAL_COMMUNITY)
Admission: RE | Admit: 2012-10-03 | Discharge: 2012-10-03 | Disposition: A | Discharge status: Home / Self Care | Payer: Medicare Other | Source: Ambulatory Visit | Attending: Gastroenterology | Admitting: Gastroenterology

## 2012-10-03 DIAGNOSIS — K2960 Other gastritis without bleeding: Secondary | ICD-10-CM

## 2012-10-03 DIAGNOSIS — K5730 Diverticulosis of large intestine without perforation or abscess without bleeding: Secondary | ICD-10-CM

## 2012-10-03 DIAGNOSIS — R197 Diarrhea, unspecified: Secondary | ICD-10-CM | POA: Insufficient documentation

## 2012-10-03 DIAGNOSIS — R1310 Dysphagia, unspecified: Secondary | ICD-10-CM | POA: Insufficient documentation

## 2012-10-03 DIAGNOSIS — I10 Essential (primary) hypertension: Secondary | ICD-10-CM | POA: Insufficient documentation

## 2012-10-03 DIAGNOSIS — D128 Benign neoplasm of rectum: Secondary | ICD-10-CM | POA: Insufficient documentation

## 2012-10-03 DIAGNOSIS — K222 Esophageal obstruction: Secondary | ICD-10-CM | POA: Insufficient documentation

## 2012-10-03 DIAGNOSIS — K625 Hemorrhage of anus and rectum: Secondary | ICD-10-CM

## 2012-10-03 DIAGNOSIS — K648 Other hemorrhoids: Secondary | ICD-10-CM

## 2012-10-03 DIAGNOSIS — K3189 Other diseases of stomach and duodenum: Secondary | ICD-10-CM | POA: Insufficient documentation

## 2012-10-03 DIAGNOSIS — K2940 Chronic atrophic gastritis without bleeding: Secondary | ICD-10-CM | POA: Insufficient documentation

## 2012-10-03 HISTORY — PX: SAVORY DILATION: SHX5439

## 2012-10-03 HISTORY — DX: Contracture of muscle, unspecified hand: M62.449

## 2012-10-03 HISTORY — PX: ESOPHAGOGASTRODUODENOSCOPY (EGD) WITH PROPOFOL: SHX5813

## 2012-10-03 HISTORY — PX: COLONOSCOPY WITH PROPOFOL: SHX5780

## 2012-10-03 LAB — BASIC METABOLIC PANEL
BUN: 10 mg/dL (ref 6–23)
CO2: 27 mEq/L (ref 19–32)
Calcium: 9.9 mg/dL (ref 8.4–10.5)
Chloride: 98 mEq/L (ref 96–112)
Creatinine, Ser: 0.59 mg/dL (ref 0.50–1.10)
GFR calc Af Amer: >90 mL/min (ref >90)
GFR calc non Af Amer: >90 mL/min (ref >90)
Glucose, Bld: 96 mg/dL (ref 70–99)
Potassium: 3.4 mEq/L — ABNORMAL LOW (ref 3.5–5.1)
Sodium: 136 mEq/L (ref 135–145)

## 2012-10-03 LAB — HEMOGLOBIN AND HEMATOCRIT, BLOOD
HCT: 37.4 % (ref 36.0–46.0)
Hemoglobin: 12.9 g/dL (ref 12.0–15.0)

## 2012-10-03 SURGERY — COLONOSCOPY WITH PROPOFOL
Anesthesia: Monitor Anesthesia Care | Site: Rectum

## 2012-10-03 MED ORDER — FENTANYL CITRATE 0.05 MG/ML IJ SOLN
INTRAMUSCULAR | Status: AC
  Filled 2012-10-03: qty 2

## 2012-10-03 MED ORDER — MIDAZOLAM HCL 5 MG/5ML IJ SOLN
INTRAMUSCULAR | Status: DC | PRN
  Administered 2012-10-03 (×2): 0.5 mg via INTRAVENOUS
  Administered 2012-10-03: 1 mg via INTRAVENOUS

## 2012-10-03 MED ORDER — ONDANSETRON HCL 4 MG/2ML IJ SOLN
INTRAMUSCULAR | Status: AC
  Filled 2012-10-03: qty 2

## 2012-10-03 MED ORDER — MIDAZOLAM HCL 2 MG/2ML IJ SOLN
INTRAMUSCULAR | Status: AC
  Filled 2012-10-03: qty 2

## 2012-10-03 MED ORDER — PROPOFOL INFUSION 10 MG/ML OPTIME
INTRAVENOUS | Status: DC | PRN
  Administered 2012-10-03: 100 ug/kg/min via INTRAVENOUS
  Administered 2012-10-03: 13:00:00 via INTRAVENOUS

## 2012-10-03 MED ORDER — LACTATED RINGERS IV SOLN
INTRAVENOUS | Status: DC
  Administered 2012-10-03: 12:00:00 via INTRAVENOUS

## 2012-10-03 MED ORDER — PROPOFOL 10 MG/ML IV EMUL
INTRAVENOUS | Status: AC
  Filled 2012-10-03: qty 20

## 2012-10-03 MED ORDER — ONDANSETRON HCL 4 MG/2ML IJ SOLN: 4.0000 mg | Freq: Once | INTRAMUSCULAR | Status: DC

## 2012-10-03 MED ORDER — GLYCOPYRROLATE 0.2 MG/ML IJ SOLN
0.2000 mg | Freq: Once | INTRAMUSCULAR | Status: AC
  Administered 2012-10-03: 0.2 mg via INTRAVENOUS

## 2012-10-03 MED ORDER — ONDANSETRON HCL 4 MG/2ML IJ SOLN: 4.0000 mg | Freq: Once | INTRAMUSCULAR | Status: DC | PRN

## 2012-10-03 MED ORDER — FENTANYL CITRATE 0.05 MG/ML IJ SOLN: 25.0000 ug | INTRAMUSCULAR | Status: DC | PRN

## 2012-10-03 MED ORDER — LIDOCAINE HCL (PF) 1 % IJ SOLN
INTRAMUSCULAR | Status: AC
  Filled 2012-10-03: qty 5

## 2012-10-03 MED ORDER — WATER FOR IRRIGATION, STERILE IR SOLN
Status: DC | PRN
  Administered 2012-10-03: 1000 mL

## 2012-10-03 MED ORDER — BUTAMBEN-TETRACAINE-BENZOCAINE 2-2-14 % EX AERO
1.0000 | INHALATION_SPRAY | Freq: Once | CUTANEOUS | Status: AC
  Administered 2012-10-03: 1 via TOPICAL
  Filled 2012-10-03: qty 56

## 2012-10-03 MED ORDER — STERILE WATER FOR IRRIGATION IR SOLN
Status: DC | PRN
  Administered 2012-10-03: 12:00:00

## 2012-10-03 MED ORDER — FENTANYL CITRATE 0.05 MG/ML IJ SOLN
INTRAMUSCULAR | Status: DC | PRN
  Administered 2012-10-03 (×4): 25 ug via INTRAVENOUS

## 2012-10-03 MED ORDER — MIDAZOLAM HCL 2 MG/2ML IJ SOLN
1.0000 mg | INTRAMUSCULAR | Status: DC | PRN
  Administered 2012-10-03: 2 mg via INTRAVENOUS

## 2012-10-03 MED ORDER — CHLORDIAZEPOXIDE HCL 25 MG PO CAPS: ORAL_CAPSULE | ORAL | Status: DC

## 2012-10-03 MED ORDER — GLYCOPYRROLATE 0.2 MG/ML IJ SOLN
INTRAMUSCULAR | Status: AC
  Filled 2012-10-03: qty 1

## 2012-10-03 SURGICAL SUPPLY — 23 items
BLOCK BITE 60FR ADLT L/F BLUE (MISCELLANEOUS) ×2 IMPLANT
ELECT REM PT RETURN 9FT ADLT (ELECTROSURGICAL)
ELECTRODE REM PT RTRN 9FT ADLT (ELECTROSURGICAL) IMPLANT
FCP BXJMBJMB 240X2.8X (CUTTING FORCEPS)
FLOOR PAD 36X40 (MISCELLANEOUS) ×3
FORCEPS BIOP RAD 4 LRG CAP 4 (CUTTING FORCEPS) ×4 IMPLANT
FORCEPS BIOP RJ4 240 W/NDL (CUTTING FORCEPS)
FORCEPS BXJMBJMB 240X2.8X (CUTTING FORCEPS) IMPLANT
INJECTOR/SNARE I SNARE (MISCELLANEOUS) IMPLANT
LUBRICANT JELLY 4.5OZ STERILE (MISCELLANEOUS) ×1 IMPLANT
MANIFOLD NEPTUNE II (INSTRUMENTS) ×3 IMPLANT
NDL SCLEROTHERAPY 25GX240 (NEEDLE) IMPLANT
NEEDLE SCLEROTHERAPY 25GX240 (NEEDLE) IMPLANT
PAD FLOOR 36X40 (MISCELLANEOUS) ×2 IMPLANT
PROBE APC STR FIRE (PROBE) IMPLANT
PROBE INJECTION GOLD (MISCELLANEOUS)
PROBE INJECTION GOLD 7FR (MISCELLANEOUS) IMPLANT
SNARE SHORT THROW 13M SML OVAL (MISCELLANEOUS) ×3 IMPLANT
SYR 50ML LL SCALE MARK (SYRINGE) ×1 IMPLANT
TRAP SPECIMEN MUCOUS 40CC (MISCELLANEOUS) IMPLANT
TUBING ENDO SMARTCAP PENTAX (MISCELLANEOUS) IMPLANT
TUBING IRRIGATION ENDOGATOR (MISCELLANEOUS) ×1 IMPLANT
WATER STERILE IRR 1000ML POUR (IV SOLUTION) ×1 IMPLANT

## 2012-10-03 NOTE — Anesthesia Procedure Notes (Signed)
Procedure Name: MAC Date/Time: 10/03/2012 12:10 PM Performed by: Franco Nones Pre-anesthesia Checklist: Patient identified, Emergency Drugs available, Suction available, Timeout performed and Patient being monitored Patient Re-evaluated:Patient Re-evaluated prior to inductionOxygen Delivery Method: Non-rebreather mask

## 2012-10-03 NOTE — Anesthesia Postprocedure Evaluation (Signed)
Anesthesia Post Note  Patient: [name]  Procedure(s) Performed: Procedure(s) (LRB): COLONOSCOPY WITH PROPOFOL (N/A) ESOPHAGOGASTRODUODENOSCOPY (EGD) WITH PROPOFOL (N/A) SAVORY DILATION (N/A)  Anesthesia type: MAC  Patient location: PACU  Post pain: Pain level controlled  Post assessment: Post-op Vital signs reviewed, Patient's Cardiovascular Status Stable, Respiratory Function Stable, Patent Airway, No signs of Nausea or vomiting and Pain level controlled  Last Vitals:  Filed Vitals:   10/03/12 1320  BP: 118/67  Pulse: 92  Temp: 36.4 C  Resp: 22    Post vital signs: Reviewed and stable  Level of consciousness: awake and alert   Complications: No apparent anesthesia complications

## 2012-10-03 NOTE — Op Note (Signed)
[facility] [address] [place], [address]   COLONOSCOPY PROCEDURE REPORT  PATIENT: [name], [name]  MR#: [id number] BIRTHDATE: September 29, 1956 , 56  yrs. old GENDER: Female ENDOSCOPIST: Jonette Eva, MD REFERRED IO:[id number] Fanta, M.D. PROCEDURE DATE:  10/03/2012 PROCEDURE:   ILEOColonoscopy with cold biopsy polypectomy and Colonoscopy with biopsy INDICATIONS:unexplained diarrhea and Rectal Bleeding. MEDICATIONS: MAC sedation, administered by CRNA  DESCRIPTION OF PROCEDURE:    Physical exam was performed.  Informed consent was obtained from the patient after explaining the benefits, risks, and alternatives to procedure.  The patient was connected to monitor and placed in left lateral position. Continuous oxygen was provided by nasal cannula and IV medicine administered through an indwelling cannula.  After administration of sedation and rectal exam, the patients rectum was intubated and the     colonoscope was advanced under direct visualization to the ileum.  The scope was removed slowly by carefully examining the color, texture, anatomy, and integrity mucosa on the way out.  The patient was recovered in endoscopy and discharged home in satisfactory condition.       COLON FINDINGS: The mucosa appeared normal in the terminal ileum.  20 CM VISUALIZED. , Moderate diverticulosis was noted throughout the entire examined colon.  , The colon mucosa was otherwise normal.  RANDOM BIOPSIES OBTAINED VIA COLD FORCEPS TO EVALUATE FOR CELIAC SPRUE. and Small internal hemorrhoids were found.  PREP QUALITY: good. CECAL W/D TIME: 20 minutes  COMPLICATIONS: None  ENDOSCOPIC IMPRESSION: 1.   Normal mucosa in the terminal ileum 2.   Moderate diverticulosis was noted throughout the entire examined colon 3.   The colon mucosa was otherwise normal 4.   Small internal hemorrhoids: causing rectal bleeding 5. NO OBVIOUS SOURCE FOR DIARRHEA  IDENTIFIED       RECOMMENDATIONS: FOLLOW A HIGH FIBER/LOW FAT DIET.  AVOID ITEMS THAT CAUSE BLOATING.   BIOPSY RESULTS SHOULD BE BACK IN 7 DAYS.  Next colonoscopy in 10 years.       _______________________________ Rosalie DoctorJonette Eva, MD 10/03/2012 3:32 PM     PATIENT NAME:  Lauren, [name] MR#: [id number]

## 2012-10-03 NOTE — Op Note (Signed)
[facility] [address] [place], [address]   ENDOSCOPY PROCEDURE REPORT  PATIENT: [name], [name]  MR#: [id number] BIRTHDATE: [birth year] , 56  yrs. old GENDER: Female  ENDOSCOPIST: Jonette Eva, MD REFFERED XB:[id number] Fanta, M.D.  PROCEDURE DATE:  10/03/2012 PROCEDURE:   EGD with biopsy for H.  pylori and EGD with dilatation over guidewire  INDICATIONS:1.  dysphagia.   2.  dyspepsia.   3.  DAILY ETOH USE. 4.  DIARRHEA. MEDICATIONS: MAC sedation, administered by CRNA TOPICAL ANESTHETIC: Cetacaine Spray  DESCRIPTION OF PROCEDURE:   After the risks benefits and alternatives of the procedure were thoroughly explained, informed consent was obtained.  THE       endoscope was introduced through the mouth and advanced to the second portion of the duodenum. The instrument was slowly withdrawn as the mucosa was carefully examined.  Prior to withdrawal of the scope, the guidwire was placed.  The        endoscope was introduced through the mouth and advanced to the ANTRUM. The instrument was slowly withdrawn as the mucosa was carefully examined. The       The instrument was slowly withdrawn as the mucosa was carefully examined. .  The endoscope was introduced through the mouth and advanced to the second portion of the duodenum. The instrument was slowly withdrawn as the mucosa was carefully examined. The patient was recovered in endoscopy and discharged home in satisfactory condition.    ESOPHAGUS: A stricture was found at the gastroesophageal junction. The stenosis was traversable with the endoscope.  STOMACH: Mild gastritis (inflammation) was found in the gastric antrum.  Multiple biopsies were performed. 3-4 CM HIATAL HERNIA.  DUODENUM: The duodenal mucosa showed no abnormalities in the bulb and second portion of the duodenum.  Cold forcep biopsies were taken in the second portion TO EVALUATE FOR CELIAC SPRUE.   Dilation was then performed at the  gastroesphageal junction Dilator: Savary over guidewire Size(s): 12.8-16 MM Resistance: minimal TO MODERATE  Heme: yes, TRACE  COMPLICATIONS: There were no complications.   ENDOSCOPIC IMPRESSION: 1.   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   Gastritis (inflammation) was found in the gastric antrum; multiple biopsies 3.   The duodenal mucosa showed no abnormalities in the bulb and second portion of the duodenum 4. ABD  PAIN/DIARRHEA MOST LIKLEY DUE TO ETOH GASTRITIS/COLOPATHY  RECOMMENDATIONS: CONTINUE omeprazole.  TAKE 30 MINUTES PRIOR TO YOUR FIRST MEAL FOREVER.  AVOID ASPIRIN, IBUPROFEN, MOTRIN, ALEVE, BC/GOODY POWDERS FOR 2 WEEKS.  USE TYLENOL FOR PAIN.  STOP DRINKING.  START LIBRIUM.  IT MAY CAUSE DROWSINESS.  IF SO JUST TAKE ONE PILL.  FOLLOW A HIGH FIBER/LOW FAT DIET.  AVOID ITEMS THAT CAUSE BLOATING.   BIOPSY RESULTS SHOULD BE BACK IN 7 DAYS.  FOLLOW UP IN 3 MOS.      _______________________________ Rosalie DoctorJonette Eva, MD 10/03/2012 3:42 PM      PATIENT NAME:  Shelby, Peltz MR#: [id number]

## 2012-10-03 NOTE — Progress Notes (Signed)
Awake. Wants to get up. Voices no c/o at this time. Swallowing without difficulty.

## 2012-10-03 NOTE — Preoperative (Signed)
Beta Blockers   Reason not to administer Beta Blockers:Not Applicable 

## 2012-10-03 NOTE — OR Nursing (Signed)
Pt. Never showed up for PAT, Unable to contact pt. Phone number listed is not accepting phone calls. Case  cancelled per protocol.

## 2012-10-03 NOTE — Anesthesia Preprocedure Evaluation (Addendum)
Anesthesia Evaluation  Patient identified by MRN, date of birth, ID band Patient awake    Reviewed: Allergy & Precautions, H&P , NPO status , Patient's Chart, lab work & pertinent test results  Airway Mallampati: II      Dental  (+) Poor Dentition   Pulmonary shortness of breath and with exertion, Current Smoker,  breath sounds clear to auscultation        Cardiovascular hypertension, Pt. on medications Rhythm:Regular Rate:Normal     Neuro/Psych Seizures -, Well Controlled,  CVA, Residual Symptoms    GI/Hepatic PUD, GERD-  Medicated,  Endo/Other    Renal/GU      Musculoskeletal   Abdominal   Peds  Hematology   Anesthesia Other Findings   Reproductive/Obstetrics                           Anesthesia Physical Anesthesia Plan  ASA: III  Anesthesia Plan: MAC   Post-op Pain Management:    Induction: Intravenous  Airway Management Planned: Simple Face Mask  Additional Equipment:   Intra-op Plan:   Post-operative Plan:   Informed Consent: I have reviewed the patients History and Physical, chart, labs and discussed the procedure including the risks, benefits and alternatives for the proposed anesthesia with the patient or authorized representative who has indicated his/her understanding and acceptance.     Plan Discussed with:   Anesthesia Plan Comments:         Anesthesia Quick Evaluation

## 2012-10-03 NOTE — H&P (Signed)
  Primary Care Physician:  Avon Gully, MD Primary Gastroenterologist:  Dr. Darrick Penna  Pre-Procedure History & Physical: HPI:  [name] is a 56 y.o. female here for BRBPR/DIARRHEA/DYSPHAGIA.   Past Medical History  Diagnosis Date  . Stroke     age 81, required brain surgery  . Seizures     since stroke, no recent seizures  . HTN (hypertension)   . PUD (peptic ulcer disease)     remote  . Shortness of breath     with exertion  . Contracture of muscle of hand     right    Past Surgical History  Procedure Date  . Abdominal hysterectomy 2004    complete  . Right tib-fib fracture 2008  . Brain surgery     age 28, stroke    Prior to Admission medications   Medication Sig Start Date End Date Taking? Authorizing Provider  albuterol (PROVENTIL HFA;VENTOLIN HFA) 108 (90 BASE) MCG/ACT inhaler Inhale 2 puffs into the lungs every 6 (six) hours as needed. FOR SHORTNESS OF BREATH AND/OR WHEEZING   Yes Historical Provider, MD  amLODipine (NORVASC) 5 MG tablet Take 5 mg by mouth daily.   Yes Historical Provider, MD  lisinopril-hydrochlorothiazide (PRINZIDE,ZESTORETIC) 20-12.5 MG per tablet Take 1 tablet by mouth daily.   Yes Historical Provider, MD  omeprazole (PRILOSEC) 20 MG capsule Take 20 mg by mouth daily.   Yes Historical Provider, MD  PHENobarbital (LUMINAL) 32.4 MG tablet Take 32.4 mg by mouth daily.  09/04/12  Yes Historical Provider, MD  phenytoin (DILANTIN) 100 MG ER capsule Take 100 mg by mouth 3 (three) times daily.   Yes Historical Provider, MD  polyethylene glycol-electrolytes (TRILYTE) 420 G solution Take 4,000 mLs by mouth as directed. 09/05/12  Yes West Bali, MD    Allergies as of 09/05/2012  . (No Known Allergies)    Family History  Problem Relation Age of Onset  . Colon cancer Neg Hx   . Liver disease Sister     etoh    History   Social History  . Marital Status: Single    Spouse Name: N/A    Number of Children: 3  . Years of Education: N/A    Occupational History  . disability    Social History Main Topics  . Smoking status: Current Some Day Smoker  . Smokeless tobacco: Not on file     Comment: 1-2 cigaretts daily  . Alcohol Use: Yes     Comment: 12 beers and several mixed drinks daily, rehab previously (quit for several weeks at a time), daily drinker since age 54s  . Drug Use: No  . Sexually Active: Not on file   Other Topics Concern  . Not on file   Social History Narrative  . No narrative on file    Review of Systems: See HPI, otherwise negative ROS   Physical Exam: BP 143/71  Pulse 76  Temp 97.6 F (36.4 C) (Oral)  Resp 18  SpO2 98% General:   Alert,  pleasant and cooperative in NAD Head:  Normocephalic and atraumatic. Neck:  Supple; Lungs:  Clear throughout to auscultation.    Heart:  Regular rate and rhythm. Abdomen:  Soft, nontender and nondistended. Normal bowel sounds, without guarding, and without rebound.   Neurologic:  Alert and  oriented x4;  grossly normal neurologically.  Impression/Plan:     BRBPR/DIARRHEA/dysphagia  PLAN: TCS/EGD/?DIL TODAY

## 2012-10-03 NOTE — Transfer of Care (Signed)
Immediate Anesthesia Transfer of Care Note  Patient: [name]  Procedure(s) Performed: Procedure(s) (LRB): COLONOSCOPY WITH PROPOFOL (N/A) ESOPHAGOGASTRODUODENOSCOPY (EGD) WITH PROPOFOL (N/A) SAVORY DILATION (N/A)  Patient Location: PACU  Anesthesia Type: MAC  Level of Consciousness: awake  Airway & Oxygen Therapy: Patient Spontanous Breathing.   Post-op Assessment: Report given to PACU RN, Post -op Vital signs reviewed and stable and Patient moving all extremities  Post vital signs: Reviewed and stable  Complications: No apparent anesthesia complications

## 2012-10-05 ENCOUNTER — Encounter (HOSPITAL_COMMUNITY): Payer: Self-pay | Admitting: Gastroenterology

## 2012-10-09 ENCOUNTER — Telehealth: Payer: Self-pay | Admitting: Gastroenterology

## 2012-10-09 NOTE — Telephone Encounter (Signed)
Please call pt. [name] colon and small bowel biopsies are normal. [name] stomach Bx shows mild gastritis. [name] DIARRHEA AND ABD PAIN ARE MOST LIKELY DUE TO IBS AND ETOH.   CONTINUE omeprazole. TAKE 30 MINUTES PRIOR TO YOUR FIRST MEAL FOREVER.   AVOID ASPIRIN, IBUPROFEN, MOTRIN, ALEVE, BC/GOODY POWDERS FOR 2 WEEKS. USE TYLENOL FOR PAIN.  STOP DRINKING. START LIBRIUM. IT MAY CAUSE DROWSINESS. IF SO JUST TAKE ONE PILL.  FOLLOW A HIGH FIBER/LOW FAT DIET. AVOID ITEMS THAT CAUSE BLOATING. S  FOLLOW UP IN 3 MOS E30 SLF GASTRITIS/ABD PAIN/DIARRHEA.  Next colonoscopy in 10 years.

## 2012-10-10 NOTE — Telephone Encounter (Signed)
Called and could not leave a message.

## 2012-10-10 NOTE — Telephone Encounter (Signed)
Path faxed to PCP, recall made 

## 2012-10-16 NOTE — Telephone Encounter (Signed)
Called and could not leave message. Mailing a letter for pt to call.

## 2012-11-13 NOTE — Progress Notes (Signed)
EGD/DIL DEC 2013 GASTRITIS TCS DEC 2013 NL COLON Bx, SIMPLE ADENOMAS  LOOSE STOOLS/DIARRHEA DUE TO ETOH AND/OR IBS

## 2012-12-21 ENCOUNTER — Encounter: Payer: Self-pay | Admitting: *Deleted

## 2013-08-17 ENCOUNTER — Emergency Department (HOSPITAL_COMMUNITY)
Admission: EM | Admit: 2013-08-17 | Discharge: 2013-08-17 | Disposition: A | Discharge status: Home / Self Care | Payer: Medicare Other | Attending: Emergency Medicine | Admitting: Emergency Medicine

## 2013-08-17 ENCOUNTER — Emergency Department (HOSPITAL_COMMUNITY): Payer: Medicare Other

## 2013-08-17 ENCOUNTER — Encounter (HOSPITAL_COMMUNITY): Payer: Self-pay | Admitting: Emergency Medicine

## 2013-08-17 DIAGNOSIS — Z8739 Personal history of other diseases of the musculoskeletal system and connective tissue: Secondary | ICD-10-CM | POA: Insufficient documentation

## 2013-08-17 DIAGNOSIS — Z8673 Personal history of transient ischemic attack (TIA), and cerebral infarction without residual deficits: Secondary | ICD-10-CM | POA: Insufficient documentation

## 2013-08-17 DIAGNOSIS — F17200 Nicotine dependence, unspecified, uncomplicated: Secondary | ICD-10-CM | POA: Insufficient documentation

## 2013-08-17 DIAGNOSIS — I10 Essential (primary) hypertension: Secondary | ICD-10-CM | POA: Insufficient documentation

## 2013-08-17 DIAGNOSIS — Z79899 Other long term (current) drug therapy: Secondary | ICD-10-CM | POA: Insufficient documentation

## 2013-08-17 DIAGNOSIS — R0789 Other chest pain: Secondary | ICD-10-CM

## 2013-08-17 DIAGNOSIS — G40909 Epilepsy, unspecified, not intractable, without status epilepticus: Secondary | ICD-10-CM | POA: Insufficient documentation

## 2013-08-17 DIAGNOSIS — Z8711 Personal history of peptic ulcer disease: Secondary | ICD-10-CM | POA: Insufficient documentation

## 2013-08-17 LAB — BASIC METABOLIC PANEL
BUN: 4 mg/dL — ABNORMAL LOW (ref 6–23)
CO2: 21 mEq/L (ref 19–32)
Calcium: 9.7 mg/dL (ref 8.4–10.5)
Chloride: 93 mEq/L — ABNORMAL LOW (ref 96–112)
Creatinine, Ser: 0.41 mg/dL — ABNORMAL LOW (ref 0.50–1.10)
GFR calc Af Amer: >90 mL/min (ref >90)
GFR calc non Af Amer: >90 mL/min (ref >90)
Glucose, Bld: 105 mg/dL — ABNORMAL HIGH (ref 70–99)
Potassium: 3.2 mEq/L — ABNORMAL LOW (ref 3.5–5.1)
Sodium: 133 mEq/L — ABNORMAL LOW (ref 135–145)

## 2013-08-17 LAB — CBC WITH DIFFERENTIAL/PLATELET
Basophils Absolute: 0 10*3/uL (ref 0.0–0.1)
Basophils Relative: 1 % (ref 0–1)
Eosinophils Absolute: 0 10*3/uL (ref 0.0–0.7)
Eosinophils Relative: 1 % (ref 0–5)
HCT: 36.1 % (ref 36.0–46.0)
Hemoglobin: 12.4 g/dL (ref 12.0–15.0)
Lymphocytes Relative: 36 % (ref 12–46)
Lymphs Abs: 1.6 10*3/uL (ref 0.7–4.0)
MCH: 31.2 pg (ref 26.0–34.0)
MCHC: 34.3 g/dL (ref 30.0–36.0)
MCV: 90.9 fL (ref 78.0–100.0)
Monocytes Absolute: 0.4 10*3/uL (ref 0.1–1.0)
Monocytes Relative: 9 % (ref 3–12)
Neutro Abs: 2.4 10*3/uL (ref 1.7–7.7)
Neutrophils Relative %: 54 % (ref 43–77)
Platelets: 181 10*3/uL (ref 150–400)
RBC: 3.97 MIL/uL (ref 3.87–5.11)
RDW: 12.7 % (ref 11.5–15.5)
WBC: 4.4 10*3/uL (ref 4.0–10.5)

## 2013-08-17 LAB — D-DIMER, QUANTITATIVE: D-Dimer, Quant: 0.29 ug/mL-FEU (ref 0.00–0.48)

## 2013-08-17 LAB — PRO B NATRIURETIC PEPTIDE: Pro B Natriuretic peptide (BNP): 24.7 pg/mL (ref 0–125)

## 2013-08-17 LAB — TROPONIN I: Troponin I: <0.3 ng/mL (ref <0.30)

## 2013-08-17 MED ORDER — TRAMADOL HCL 50 MG PO TABS: 50.0000 mg | ORAL_TABLET | Freq: Four times a day (QID) | ORAL | Status: DC | PRN

## 2013-08-17 NOTE — ED Notes (Signed)
CP x 3 weeks.

## 2013-08-17 NOTE — ED Provider Notes (Addendum)
CSN: [id number]     Arrival date & time 08/17/13  1623 History  This chart was scribed for Lauren Crease, MD by Joaquin Music, ED Scribe. This patient was seen in room APA18/[facility]18 and the patient's care was started at  4:29 PM  Chief Complaint  Patient presents with  . Chest Pain    The history is provided by the patient and a relative. No language interpreter was used.   HPI Comments: [name] is a 57 y.o. female who presents to the Emergency Department complaining of ongoing, coming and going chest pain with associated tenderness to palpitation, onset 2-3 weeks ago. Pt states she generally has the pain present and it very seldomly goes away. Pt states breathing and raising her upper extremities worsen the pain. She describes the pain as though she feels she "is having a heart attack" but denies ever having a heart attack.   Past Medical History  Diagnosis Date  . Stroke     age 4, required brain surgery  . Seizures     since stroke, no recent seizures  . HTN (hypertension)   . PUD (peptic ulcer disease)     remote  . Shortness of breath     with exertion  . Contracture of muscle of hand     right   Past Surgical History  Procedure Laterality Date  . Abdominal hysterectomy  2004    complete  . Right tib-fib fracture  2008  . Brain surgery      age 48, stroke  . Colonoscopy with propofol  10/03/2012    Procedure: COLONOSCOPY WITH PROPOFOL;  Surgeon: West Bali, MD;  Location: [facility];  Service: Endoscopy;  Laterality: N/A;  1230 in cecum, total withdrawel time 18 minutes, done at 1248  . Esophagogastroduodenoscopy (egd) with propofol  10/03/2012    Procedure: ESOPHAGOGASTRODUODENOSCOPY (EGD) WITH PROPOFOL;  Surgeon: West Bali, MD;  Location: [facility];  Service: Endoscopy;  Laterality: N/A;  started at 1254  . Savory dilation  10/03/2012    Procedure: SAVORY DILATION;  Surgeon: West Bali, MD;  Location: [facility];  Service: Endoscopy;   Laterality: N/A;  started at 1303,  dilated 12.8-16mm   Family History  Problem Relation Age of Onset  . Colon cancer Neg Hx   . Liver disease Sister     etoh   History  Substance Use Topics  . Smoking status: Current Some Day Smoker  . Smokeless tobacco: Not on file     Comment: 1-2 cigaretts daily  . Alcohol Use: Yes     Comment: 12 beers and several mixed drinks daily, rehab previously (quit for several weeks at a time), daily drinker since age 88s   OB History   Grav Para Term Preterm Abortions TAB SAB Ect Mult Living                 Review of Systems  All other systems reviewed and are negative.    Allergies  Review of patient's allergies indicates no known allergies.  Home Medications   Current Outpatient Rx  Name  Route  Sig  Dispense  Refill  . albuterol (PROVENTIL HFA;VENTOLIN HFA) 108 (90 BASE) MCG/ACT inhaler   Inhalation   Inhale 2 puffs into the lungs every 6 (six) hours as needed. FOR SHORTNESS OF BREATH AND/OR WHEEZING         . amLODipine (NORVASC) 5 MG tablet   Oral   Take 5 mg by mouth daily.         Marland Kitchen  chlordiazePOXIDE (LIBRIUM) 25 MG capsule      2 po q6h for 2 days then 2 po q8h for 2 days the 2 po q12h for 2 days then 2 po qd for 2 days   40 capsule   0   . lisinopril-hydrochlorothiazide (PRINZIDE,ZESTORETIC) 20-12.5 MG per tablet   Oral   Take 1 tablet by mouth daily.         Marland Kitchen omeprazole (PRILOSEC) 20 MG capsule   Oral   Take 20 mg by mouth daily.         Marland Kitchen PHENobarbital (LUMINAL) 32.4 MG tablet   Oral   Take 32.4 mg by mouth daily.          . phenytoin (DILANTIN) 100 MG ER capsule   Oral   Take 100 mg by mouth 3 (three) times daily.          Triage Vitals:BP 142/80  Pulse 92  Temp(Src) 98.1 F (36.7 C) (Oral)  Resp 18  SpO2 96%  Physical Exam  Constitutional: She is oriented to person, place, and time. She appears well-developed and well-nourished. No distress.  Pt has slurred speech.  HENT:  Head:  Normocephalic and atraumatic.  Right Ear: Hearing normal.  Left Ear: Hearing normal.  Nose: Nose normal.  Mouth/Throat: Oropharynx is clear and moist and mucous membranes are normal.  Eyes: Conjunctivae and EOM are normal. Pupils are equal, round, and reactive to light.  Neck: Normal range of motion. Neck supple.  Cardiovascular: Regular rhythm, S1 normal and S2 normal.  Exam reveals no gallop and no friction rub.   No murmur heard. Pulmonary/Chest: Effort normal and breath sounds normal. No respiratory distress. She exhibits tenderness.  Tenderness to L sternal wall border.  Abdominal: Soft. Normal appearance and bowel sounds are normal. There is no hepatosplenomegaly. There is no tenderness. There is no rebound, no guarding, no tenderness at McBurney's point and negative Murphy's sign. No hernia.  Musculoskeletal: Normal range of motion.  Neurological: She is alert and oriented to person, place, and time. She has normal strength. No cranial nerve deficit or sensory deficit. Coordination normal. GCS eye subscore is 4. GCS verbal subscore is 5. GCS motor subscore is 6.  Skin: Skin is warm, dry and intact. No rash noted. No cyanosis.  Psychiatric: She has a normal mood and affect. Her speech is normal and behavior is normal. Thought content normal.    ED Course  Procedures  DIAGNOSTIC STUDIES: Oxygen Saturation is 96% on RA, normal by my interpretation.    COORDINATION OF CARE: 4:34 PM-Discussed treatment plan which includes EKG and labs. Pt agreed to plan.   Labs Review Labs Reviewed  BASIC METABOLIC PANEL - Abnormal; Notable for the following:    Sodium 133 (*)    Potassium 3.2 (*)    Chloride 93 (*)    Glucose, Bld 105 (*)    BUN 4 (*)    Creatinine, Ser 0.41 (*)    All other components within normal limits  CBC WITH DIFFERENTIAL  PRO B NATRIURETIC PEPTIDE  TROPONIN I  D-DIMER, QUANTITATIVE   Imaging Review Dg Chest 2 View  08/17/2013   CLINICAL DATA:  Chest pain  EXAM:  CHEST  2 VIEW  COMPARISON:  10/10/2010  FINDINGS: The heart size and mediastinal contours are within normal limits. Both lungs are clear. The visualized skeletal structures are unremarkable.  IMPRESSION: No active cardiopulmonary disease.   Electronically Signed   By: Alcide Clever M.D.   On: 08/17/2013 17:20  EKG Interpretation     Ventricular Rate:  91 PR Interval:  166 QRS Duration: 86 QT Interval:  386 QTC Calculation: 474 R Axis:   17 Text Interpretation:  Normal sinus rhythm Normal ECG When compared with ECG of 06-Jan-2012 20:29, No significant change was found            MDM  Diagnosis: Atypical chest pain (chest wall pain)   Patient presents to the ER for evaluation of chest pain. She has had chest pain for 3 weeks. She reports that the pain has been continuous over this period of time. Examination reveals significant increasing of her pain with active and passive range of motion of the left arm as well as palpation on the left anterior chest wall. This is very atypical for cardiac chest pain. Her EKG is normal. Troponin was negative. After 3 weeks of continuous chest pain, normal EKG and normal troponin is felt to be very reassuring rule out acute coronary syndrome. Patient did report some shortness of breath. Chest x-ray is clear. She does not have any evidence of congestive heart failure. D-dimer was also negative. Patient does have cardiac risk factors, but as the symptoms are so atypical, it is felt that she does not require hospitalization at this time. I discussed the findings with her, she does not remember having a fall around the time that the pain started. This is very consistent with musculoskeletal chest wall pain. She will followup with her primary care doctor. Return to the ER if her symptoms worsen.  I personally performed the services described in this documentation, which was scribed in my presence. The recorded information has been reviewed and is  accurate.    Lauren Crease, MD 08/17/13 1742  Lauren Crease, MD 08/17/13 [phone]

## 2013-08-17 NOTE — ED Notes (Signed)
Pt c/o left side chest pain x3 week. Pt describes pain as sharp and "mostly constant". Pt also reports SOB, weakness, N/V, headache. Pt has hx of stroke.

## 2013-08-17 NOTE — ED Notes (Signed)
Pt ambulated to restroom. 

## 2014-07-31 ENCOUNTER — Other Ambulatory Visit (HOSPITAL_COMMUNITY): Payer: Self-pay | Admitting: Internal Medicine

## 2014-07-31 ENCOUNTER — Ambulatory Visit (HOSPITAL_COMMUNITY)
Admission: RE | Admit: 2014-07-31 | Discharge: 2014-07-31 | Disposition: A | Discharge status: Home / Self Care | Payer: Medicare Other | Source: Ambulatory Visit | Attending: Internal Medicine | Admitting: Internal Medicine

## 2014-07-31 DIAGNOSIS — M25511 Pain in right shoulder: Secondary | ICD-10-CM | POA: Diagnosis present

## 2014-07-31 DIAGNOSIS — R52 Pain, unspecified: Secondary | ICD-10-CM

## 2014-07-31 DIAGNOSIS — S42211A Unspecified displaced fracture of surgical neck of right humerus, initial encounter for closed fracture: Secondary | ICD-10-CM | POA: Insufficient documentation

## 2014-07-31 DIAGNOSIS — W19XXXA Unspecified fall, initial encounter: Secondary | ICD-10-CM | POA: Diagnosis not present

## 2014-08-11 ENCOUNTER — Emergency Department (HOSPITAL_COMMUNITY)
Admission: EM | Admit: 2014-08-11 | Discharge: 2014-08-11 | Disposition: A | Discharge status: Home / Self Care | Payer: Medicare Other | Attending: Emergency Medicine | Admitting: Emergency Medicine

## 2014-08-11 ENCOUNTER — Encounter (HOSPITAL_COMMUNITY): Payer: Self-pay | Admitting: Emergency Medicine

## 2014-08-11 ENCOUNTER — Emergency Department (HOSPITAL_COMMUNITY): Payer: Medicare Other

## 2014-08-11 DIAGNOSIS — Z792 Long term (current) use of antibiotics: Secondary | ICD-10-CM | POA: Insufficient documentation

## 2014-08-11 DIAGNOSIS — Z79899 Other long term (current) drug therapy: Secondary | ICD-10-CM | POA: Insufficient documentation

## 2014-08-11 DIAGNOSIS — G40909 Epilepsy, unspecified, not intractable, without status epilepticus: Secondary | ICD-10-CM | POA: Insufficient documentation

## 2014-08-11 DIAGNOSIS — Z7952 Long term (current) use of systemic steroids: Secondary | ICD-10-CM | POA: Diagnosis not present

## 2014-08-11 DIAGNOSIS — G8929 Other chronic pain: Secondary | ICD-10-CM | POA: Insufficient documentation

## 2014-08-11 DIAGNOSIS — Z8673 Personal history of transient ischemic attack (TIA), and cerebral infarction without residual deficits: Secondary | ICD-10-CM | POA: Insufficient documentation

## 2014-08-11 DIAGNOSIS — J441 Chronic obstructive pulmonary disease with (acute) exacerbation: Secondary | ICD-10-CM | POA: Insufficient documentation

## 2014-08-11 DIAGNOSIS — I10 Essential (primary) hypertension: Secondary | ICD-10-CM | POA: Diagnosis not present

## 2014-08-11 DIAGNOSIS — Z87891 Personal history of nicotine dependence: Secondary | ICD-10-CM | POA: Diagnosis not present

## 2014-08-11 DIAGNOSIS — R079 Chest pain, unspecified: Secondary | ICD-10-CM

## 2014-08-11 DIAGNOSIS — Z8711 Personal history of peptic ulcer disease: Secondary | ICD-10-CM | POA: Diagnosis not present

## 2014-08-11 DIAGNOSIS — R059 Cough, unspecified: Secondary | ICD-10-CM

## 2014-08-11 DIAGNOSIS — R05 Cough: Secondary | ICD-10-CM

## 2014-08-11 DIAGNOSIS — R0602 Shortness of breath: Secondary | ICD-10-CM | POA: Diagnosis present

## 2014-08-11 DIAGNOSIS — Z8739 Personal history of other diseases of the musculoskeletal system and connective tissue: Secondary | ICD-10-CM | POA: Diagnosis not present

## 2014-08-11 HISTORY — DX: Alcohol abuse, uncomplicated: F10.10

## 2014-08-11 MED ORDER — PREDNISONE 50 MG PO TABS
60.0000 mg | ORAL_TABLET | Freq: Once | ORAL | Status: AC
  Administered 2014-08-11: 60 mg via ORAL
  Filled 2014-08-11 (×2): qty 1

## 2014-08-11 MED ORDER — AZITHROMYCIN 250 MG PO TABS
500.0000 mg | ORAL_TABLET | Freq: Once | ORAL | Status: AC
  Administered 2014-08-11: 500 mg via ORAL
  Filled 2014-08-11: qty 2

## 2014-08-11 MED ORDER — AZITHROMYCIN 250 MG PO TABS: ORAL_TABLET | ORAL | Status: DC

## 2014-08-11 MED ORDER — PREDNISONE 50 MG PO TABS: ORAL_TABLET | ORAL | Status: DC

## 2014-08-11 MED ORDER — IPRATROPIUM-ALBUTEROL 0.5-2.5 (3) MG/3ML IN SOLN
3.0000 mL | Freq: Once | RESPIRATORY_TRACT | Status: AC
  Administered 2014-08-11: 3 mL via RESPIRATORY_TRACT
  Filled 2014-08-11: qty 3

## 2014-08-11 MED ORDER — ALBUTEROL SULFATE (2.5 MG/3ML) 0.083% IN NEBU
2.5000 mg | INHALATION_SOLUTION | Freq: Once | RESPIRATORY_TRACT | Status: AC
  Administered 2014-08-11: 2.5 mg via RESPIRATORY_TRACT
  Filled 2014-08-11: qty 3

## 2014-08-11 MED ORDER — ALBUTEROL SULFATE HFA 108 (90 BASE) MCG/ACT IN AERS
2.0000 | INHALATION_SPRAY | Freq: Once | RESPIRATORY_TRACT | Status: AC
  Administered 2014-08-11: 2 via RESPIRATORY_TRACT
  Filled 2014-08-11: qty 6.7

## 2014-08-11 NOTE — Discharge Instructions (Signed)
Chest x-ray showed no pneumonia. Prescription for antibiotic, prednisone, inhaler. Stop smoking

## 2014-08-11 NOTE — ED Provider Notes (Signed)
CSN: [id number]     Arrival date & time 08/11/14  1434 History  This chart was scribed for Lauren Christen, MD by Hilda Lias, ED Scribe. This patient was seen in room APA03/[facility]03 and the patient's care was started at 3:12 PM.    Chief Complaint  Patient presents with  . Shortness of Breath    The history is provided by the patient and a relative. No language interpreter was used.   HPI Comments: [name] is a 58 y.o. female who presents to the Emergency Department complaining of SOB that has been constant for the past 3 weeks. Pt thinks that she may have an infection in her chest that is causing the congestion. Pt reports that she is wheezing and coughing up yellow/green sputum. Pt notes that she gets winded walking around the house. Pt says she is able to drink fluids but has had no appetite lately. Pt has an arm that is fractured and also noted as a baseline problem from prior CVA. Pt is a former smoker and quit last week. Pt reports using a Proventil inhaler at home as well as using cough drops and alka seltzer to treat the present illness with little relief.    Past Medical History  Diagnosis Date  . Stroke     age 75, required brain surgery  . Seizures     since stroke, no recent seizures  . HTN (hypertension)   . PUD (peptic ulcer disease)     remote  . Shortness of breath     with exertion  . Contracture of muscle of hand     right  . Alcohol abuse   . Chronic abdominal pain   . Chronic diarrhea    Past Surgical History  Procedure Laterality Date  . Abdominal hysterectomy  2004    complete  . Right tib-fib fracture  2008  . Brain surgery      age 85, stroke  . Colonoscopy with propofol  10/03/2012    Procedure: COLONOSCOPY WITH PROPOFOL;  Surgeon: Danie Binder, MD;  Location: [facility];  Service: Endoscopy;  Laterality: N/A;  1230 in cecum, total withdrawel time 18 minutes, done at 1248  . Esophagogastroduodenoscopy (egd) with propofol  10/03/2012    Procedure:  ESOPHAGOGASTRODUODENOSCOPY (EGD) WITH PROPOFOL;  Surgeon: Danie Binder, MD;  Location: [facility];  Service: Endoscopy;  Laterality: N/A;  started at 1254  . Savory dilation  10/03/2012    Procedure: SAVORY DILATION;  Surgeon: Danie Binder, MD;  Location: [facility];  Service: Endoscopy;  Laterality: N/A;  started at 1303,  dilated 12.8-16mm   Family History  Problem Relation Age of Onset  . Colon cancer Neg Hx   . Liver disease Sister     etoh   History  Substance Use Topics  . Smoking status: Former Smoker -- 0.04 packs/day for 9 years    Types: Cigarettes    Quit date: 07/28/2014  . Smokeless tobacco: Never Used  . Alcohol Use: Yes     Comment: last drank on Monday   OB History   Grav Para Term Preterm Abortions TAB SAB Ect Mult Living   4 3 3  1  1   3      Review of Systems  All other systems reviewed and are negative.   A complete 10 system review of systems was obtained and all systems are negative except as noted in the HPI and PMH.    Allergies  Other  Home  Medications   Prior to Admission medications   Medication Sig Start Date End Date Taking? Authorizing Provider  albuterol (PROVENTIL HFA;VENTOLIN HFA) 108 (90 BASE) MCG/ACT inhaler Inhale 2 puffs into the lungs every 6 (six) hours as needed. FOR SHORTNESS OF BREATH AND/OR WHEEZING   Yes Historical Provider, MD  amLODipine (NORVASC) 5 MG tablet Take 5 mg by mouth daily.   Yes Historical Provider, MD  chlordiazePOXIDE (LIBRIUM) 25 MG capsule Take 25 mg by mouth 2 (two) times daily. 10/03/12  Yes Danie Binder, MD  lisinopril-hydrochlorothiazide (PRINZIDE,ZESTORETIC) 20-12.5 MG per tablet Take 1 tablet by mouth daily.   Yes Historical Provider, MD  omeprazole (PRILOSEC) 20 MG capsule Take 20 mg by mouth daily.   Yes Historical Provider, MD  PHENobarbital (LUMINAL) 32.4 MG tablet Take 32.4 mg by mouth daily.  09/04/12  Yes Historical Provider, MD  phenytoin (DILANTIN) 100 MG ER capsule Take 100 mg by mouth 3 (three)  times daily.   Yes Historical Provider, MD  traMADol (ULTRAM) 50 MG tablet Take 1 tablet (50 mg total) by mouth every 6 (six) hours as needed for pain. 08/17/13  Yes Orpah Greek, MD  azithromycin (ZITHROMAX) 250 MG tablet 1 tablet daily starting Monday at dinnertime. 08/11/14   Lauren Christen, MD  predniSONE (DELTASONE) 50 MG tablet 1 tablet daily for 4 days, one half tablet daily for 4 days 08/11/14   Lauren Christen, MD   BP 156/92  Pulse 111  Temp(Src) 98.2 F (36.8 C) (Oral)  Resp 24  Ht 5\' 1"  (1.549 m)  Wt 136 lb (61.689 kg)  BMI 25.71 kg/m2  SpO2 100% Physical Exam  Nursing note and vitals reviewed. Constitutional: She is oriented to person, place, and time.  Dry, parched lips   HENT:  Head: Normocephalic and atraumatic.  Eyes: Conjunctivae and EOM are normal. Pupils are equal, round, and reactive to light.  Neck: Normal range of motion. Neck supple.  Cardiovascular: Normal rate, regular rhythm and normal heart sounds.   Pulmonary/Chest: Effort normal and breath sounds normal.  Bilateral wheezing Not tachypnic or dyspnic  Abdominal: Soft. Bowel sounds are normal.  Musculoskeletal: Normal range of motion.  Right wrist and elbow in flexion  Neurological: She is alert and oriented to person, place, and time.  Skin: Skin is warm and dry.  Psychiatric: She has a normal mood and affect. Her behavior is normal.    ED Course  Procedures (including critical care time)  DIAGNOSTIC STUDIES: Oxygen Saturation is 90% on RA, low by my interpretation.    COORDINATION OF CARE: 3:18 PM Discussed treatment plan with pt at bedside and pt agreed to plan. Pt will receive CXR and breathing treatment   Labs Review Labs Reviewed - No data to display  Imaging Review Dg Chest 2 View  08/11/2014   CLINICAL DATA:  Chest pain, cough for 3 weeks, shortness of breath, past history of stroke, hypertension, alcohol abuse, former smoker  EXAM: CHEST  2 VIEW  COMPARISON:  08/17/2013  FINDINGS:  Normal heart size, mediastinal contours, and pulmonary vascularity.  Lungs appear hyperinflated with minimal peribronchial thickening.  No acute infiltrate, pleural effusion or pneumothorax.  Numerous EKG leads project over chest.  IMPRESSION: COPD changes.  No acute abnormalities.   Electronically Signed   By: Lavonia Dana M.D.   On: 08/11/2014 16:20     EKG Interpretation None      MDM   Final diagnoses:  COPD exacerbation    Patient feels better after albuterol/Atrovent  nebulizer treatment.  Chest x-ray shows COPD changes but no pneumonia. Symptoms have been apparent for 3 weeks.  Will start Zithromax, prednisone, albuterol inhaler.  I personally performed the services described in this documentation, which was scribed in my presence. The recorded information has been reviewed and is accurate.    Lauren Christen, MD 08/11/14 [phone]

## 2014-08-11 NOTE — ED Notes (Signed)
Patient c/o shortness of breath with productive cough x3 weeks that is progressively getting worse. Per patient sputum thick and green. Per patient using inhaler, last used this morning with some relief. Reports chest pain  With cough. Patient reports that she did have fevers but none today.   ---Patient reports being here Monday for fall and diagnosed fractured right arm, patient given sling but not wearing it at this time.---

## 2014-08-19 ENCOUNTER — Encounter (HOSPITAL_COMMUNITY): Payer: Self-pay | Admitting: Emergency Medicine

## 2014-09-16 ENCOUNTER — Other Ambulatory Visit (HOSPITAL_COMMUNITY): Payer: Self-pay | Admitting: Internal Medicine

## 2014-09-16 DIAGNOSIS — Z1231 Encounter for screening mammogram for malignant neoplasm of breast: Secondary | ICD-10-CM

## 2014-09-25 ENCOUNTER — Ambulatory Visit (HOSPITAL_COMMUNITY): Payer: Medicare Other

## 2014-10-03 ENCOUNTER — Ambulatory Visit (HOSPITAL_COMMUNITY): Payer: Medicare Other

## 2014-10-03 ENCOUNTER — Ambulatory Visit (HOSPITAL_COMMUNITY)
Admission: RE | Admit: 2014-10-03 | Discharge: 2014-10-03 | Disposition: A | Discharge status: Home / Self Care | Payer: PRIVATE HEALTH INSURANCE | Source: Ambulatory Visit | Attending: Internal Medicine | Admitting: Internal Medicine

## 2014-10-03 DIAGNOSIS — Z1231 Encounter for screening mammogram for malignant neoplasm of breast: Secondary | ICD-10-CM | POA: Insufficient documentation

## 2014-10-10 ENCOUNTER — Inpatient Hospital Stay (HOSPITAL_COMMUNITY)
Admission: EM | Admit: 2014-10-10 | Discharge: 2014-10-13 | DRG: 894 | Discharge status: Against Medical Advice | Payer: PRIVATE HEALTH INSURANCE | Attending: Internal Medicine | Admitting: Internal Medicine

## 2014-10-10 ENCOUNTER — Emergency Department (HOSPITAL_COMMUNITY): Payer: PRIVATE HEALTH INSURANCE

## 2014-10-10 ENCOUNTER — Encounter (HOSPITAL_COMMUNITY): Payer: Self-pay | Admitting: Emergency Medicine

## 2014-10-10 DIAGNOSIS — E872 Acidosis: Secondary | ICD-10-CM | POA: Diagnosis present

## 2014-10-10 DIAGNOSIS — R112 Nausea with vomiting, unspecified: Secondary | ICD-10-CM

## 2014-10-10 DIAGNOSIS — F10129 Alcohol abuse with intoxication, unspecified: Principal | ICD-10-CM | POA: Diagnosis present

## 2014-10-10 DIAGNOSIS — R4182 Altered mental status, unspecified: Secondary | ICD-10-CM | POA: Diagnosis present

## 2014-10-10 DIAGNOSIS — R569 Unspecified convulsions: Secondary | ICD-10-CM | POA: Diagnosis present

## 2014-10-10 DIAGNOSIS — R000 Tachycardia, unspecified: Secondary | ICD-10-CM

## 2014-10-10 DIAGNOSIS — Z8673 Personal history of transient ischemic attack (TIA), and cerebral infarction without residual deficits: Secondary | ICD-10-CM | POA: Diagnosis not present

## 2014-10-10 DIAGNOSIS — Y908 Blood alcohol level of 240 mg/100 ml or more: Secondary | ICD-10-CM | POA: Diagnosis present

## 2014-10-10 DIAGNOSIS — K920 Hematemesis: Secondary | ICD-10-CM

## 2014-10-10 DIAGNOSIS — I10 Essential (primary) hypertension: Secondary | ICD-10-CM | POA: Diagnosis present

## 2014-10-10 DIAGNOSIS — F10120 Alcohol abuse with intoxication, uncomplicated: Secondary | ICD-10-CM

## 2014-10-10 DIAGNOSIS — E860 Dehydration: Secondary | ICD-10-CM | POA: Diagnosis present

## 2014-10-10 DIAGNOSIS — E876 Hypokalemia: Secondary | ICD-10-CM | POA: Diagnosis present

## 2014-10-10 DIAGNOSIS — Z79899 Other long term (current) drug therapy: Secondary | ICD-10-CM | POA: Diagnosis not present

## 2014-10-10 DIAGNOSIS — Z87891 Personal history of nicotine dependence: Secondary | ICD-10-CM | POA: Diagnosis not present

## 2014-10-10 DIAGNOSIS — R740 Nonspecific elevation of levels of transaminase and lactic acid dehydrogenase [LDH]: Secondary | ICD-10-CM

## 2014-10-10 DIAGNOSIS — E870 Hyperosmolality and hypernatremia: Secondary | ICD-10-CM | POA: Diagnosis present

## 2014-10-10 DIAGNOSIS — G40909 Epilepsy, unspecified, not intractable, without status epilepticus: Secondary | ICD-10-CM

## 2014-10-10 DIAGNOSIS — R7401 Elevation of levels of liver transaminase levels: Secondary | ICD-10-CM

## 2014-10-10 DIAGNOSIS — Z7952 Long term (current) use of systemic steroids: Secondary | ICD-10-CM

## 2014-10-10 DIAGNOSIS — F10929 Alcohol use, unspecified with intoxication, unspecified: Secondary | ICD-10-CM | POA: Diagnosis present

## 2014-10-10 DIAGNOSIS — I471 Supraventricular tachycardia: Secondary | ICD-10-CM

## 2014-10-10 DIAGNOSIS — R7989 Other specified abnormal findings of blood chemistry: Secondary | ICD-10-CM

## 2014-10-10 DIAGNOSIS — F10920 Alcohol use, unspecified with intoxication, uncomplicated: Secondary | ICD-10-CM

## 2014-10-10 LAB — PHENYTOIN LEVEL, TOTAL: Phenytoin Lvl: <2.5 ug/mL — ABNORMAL LOW (ref 10.0–20.0)

## 2014-10-10 LAB — COMPREHENSIVE METABOLIC PANEL
ALT: 47 U/L — ABNORMAL HIGH (ref 0–35)
AST: 134 U/L — ABNORMAL HIGH (ref 0–37)
Albumin: 2.7 g/dL — ABNORMAL LOW (ref 3.5–5.2)
Alkaline Phosphatase: 107 U/L (ref 39–117)
Anion gap: 11 (ref 5–15)
BUN: <5 mg/dL — ABNORMAL LOW (ref 6–23)
CO2: 28 mmol/L (ref 19–32)
Calcium: 7.9 mg/dL — ABNORMAL LOW (ref 8.4–10.5)
Chloride: 107 mEq/L (ref 96–112)
Creatinine, Ser: 0.4 mg/dL — ABNORMAL LOW (ref 0.50–1.10)
GFR calc Af Amer: >90 mL/min (ref >90)
GFR calc non Af Amer: >90 mL/min (ref >90)
Glucose, Bld: 131 mg/dL — ABNORMAL HIGH (ref 70–99)
Potassium: 2.7 mmol/L — CL (ref 3.5–5.1)
Sodium: 146 mmol/L — ABNORMAL HIGH (ref 135–145)
Total Bilirubin: 0.4 mg/dL (ref 0.3–1.2)
Total Protein: 7.3 g/dL (ref 6.0–8.3)

## 2014-10-10 LAB — RAPID URINE DRUG SCREEN, HOSP PERFORMED
Amphetamines: NOT DETECTED
Barbiturates: NOT DETECTED
Benzodiazepines: NOT DETECTED
Cocaine: NOT DETECTED
Opiates: NOT DETECTED
Tetrahydrocannabinol: NOT DETECTED

## 2014-10-10 LAB — CBC WITH DIFFERENTIAL/PLATELET
Basophils Absolute: 0 10*3/uL (ref 0.0–0.1)
Basophils Relative: 0 % (ref 0–1)
Eosinophils Absolute: 0 10*3/uL (ref 0.0–0.7)
Eosinophils Relative: 0 % (ref 0–5)
HCT: 37.7 % (ref 36.0–46.0)
Hemoglobin: 12.6 g/dL (ref 12.0–15.0)
Lymphocytes Relative: 15 % (ref 12–46)
Lymphs Abs: 1.5 10*3/uL (ref 0.7–4.0)
MCH: 31.3 pg (ref 26.0–34.0)
MCHC: 33.4 g/dL (ref 30.0–36.0)
MCV: 93.8 fL (ref 78.0–100.0)
Monocytes Absolute: 0.5 10*3/uL (ref 0.1–1.0)
Monocytes Relative: 5 % (ref 3–12)
Neutro Abs: 7.8 10*3/uL — ABNORMAL HIGH (ref 1.7–7.7)
Neutrophils Relative %: 80 % — ABNORMAL HIGH (ref 43–77)
Platelets: 215 10*3/uL (ref 150–400)
RBC: 4.02 MIL/uL (ref 3.87–5.11)
RDW: 14.2 % (ref 11.5–15.5)
WBC: 9.8 10*3/uL (ref 4.0–10.5)

## 2014-10-10 LAB — LACTIC ACID, PLASMA
Lactic Acid, Venous: 4.4 mmol/L — ABNORMAL HIGH (ref 0.5–2.2)
Lactic Acid, Venous: 4.1 mmol/L — ABNORMAL HIGH (ref 0.5–2.2)

## 2014-10-10 LAB — ETHANOL: Alcohol, Ethyl (B): 388 mg/dL — ABNORMAL HIGH (ref 0–9)

## 2014-10-10 LAB — SALICYLATE LEVEL: Salicylate Lvl: <4 mg/dL (ref 2.8–20.0)

## 2014-10-10 LAB — PH, GASTRIC FLUID (GASTROCCULT CARD): pH, Gastric: 4

## 2014-10-10 LAB — PHENOBARBITAL LEVEL: Phenobarbital: <5 ug/mL — ABNORMAL LOW (ref 15.0–40.0)

## 2014-10-10 LAB — CBG MONITORING, ED: Glucose-Capillary: 117 mg/dL — ABNORMAL HIGH (ref 70–99)

## 2014-10-10 LAB — ACETAMINOPHEN LEVEL: Acetaminophen (Tylenol), Serum: <10 ug/mL — ABNORMAL LOW (ref 10–30)

## 2014-10-10 LAB — POC OCCULT BLOOD, ED: Fecal Occult Bld: NEGATIVE

## 2014-10-10 LAB — MAGNESIUM: Magnesium: 1.4 mg/dL — ABNORMAL LOW (ref 1.5–2.5)

## 2014-10-10 MED ORDER — LORAZEPAM 2 MG/ML IJ SOLN: 1.0000 mg | Freq: Four times a day (QID) | INTRAMUSCULAR | Status: AC | PRN

## 2014-10-10 MED ORDER — IPRATROPIUM-ALBUTEROL 0.5-2.5 (3) MG/3ML IN SOLN
3.0000 mL | Freq: Four times a day (QID) | RESPIRATORY_TRACT | Status: DC | PRN
  Administered 2014-10-10 – 2014-10-11 (×2): 3 mL via RESPIRATORY_TRACT
  Filled 2014-10-10 (×2): qty 3

## 2014-10-10 MED ORDER — ADULT MULTIVITAMIN W/MINERALS CH
1.0000 | ORAL_TABLET | Freq: Every day | ORAL | Status: DC
  Administered 2014-10-10 – 2014-10-13 (×4): 1 via ORAL
  Filled 2014-10-10 (×4): qty 1

## 2014-10-10 MED ORDER — SODIUM CHLORIDE 0.9 % IV SOLN: INTRAVENOUS | Status: AC

## 2014-10-10 MED ORDER — THIAMINE HCL 100 MG/ML IJ SOLN: 100.0000 mg | Freq: Every day | INTRAMUSCULAR | Status: DC

## 2014-10-10 MED ORDER — LORAZEPAM 2 MG/ML IJ SOLN: 0.0000 mg | Freq: Two times a day (BID) | INTRAMUSCULAR | Status: DC

## 2014-10-10 MED ORDER — FOLIC ACID 1 MG PO TABS
1.0000 mg | ORAL_TABLET | Freq: Every day | ORAL | Status: DC
  Administered 2014-10-10 – 2014-10-13 (×4): 1 mg via ORAL
  Filled 2014-10-10 (×4): qty 1

## 2014-10-10 MED ORDER — PHENOBARBITAL 32.4 MG PO TABS
32.4000 mg | ORAL_TABLET | Freq: Every day | ORAL | Status: DC
  Administered 2014-10-10 – 2014-10-13 (×4): 32.4 mg via ORAL
  Filled 2014-10-10 (×4): qty 1

## 2014-10-10 MED ORDER — HEPARIN SODIUM (PORCINE) 5000 UNIT/ML IJ SOLN
5000.0000 [IU] | Freq: Three times a day (TID) | INTRAMUSCULAR | Status: DC
  Administered 2014-10-10 – 2014-10-13 (×9): 5000 [IU] via SUBCUTANEOUS
  Filled 2014-10-10 (×8): qty 1

## 2014-10-10 MED ORDER — SODIUM CHLORIDE 0.9 % IV BOLUS (SEPSIS)
1000.0000 mL | Freq: Once | INTRAVENOUS | Status: AC
  Administered 2014-10-10: 1000 mL via INTRAVENOUS

## 2014-10-10 MED ORDER — SODIUM CHLORIDE 0.9 % IV SOLN
8.0000 mg/h | INTRAVENOUS | Status: DC
  Administered 2014-10-10: 8 mg/h via INTRAVENOUS
  Filled 2014-10-10 (×8): qty 80

## 2014-10-10 MED ORDER — ONDANSETRON HCL 4 MG/2ML IJ SOLN
4.0000 mg | Freq: Four times a day (QID) | INTRAMUSCULAR | Status: DC | PRN
  Administered 2014-10-10: 4 mg via INTRAVENOUS
  Filled 2014-10-10: qty 2

## 2014-10-10 MED ORDER — SODIUM CHLORIDE 0.9 % IV SOLN
Freq: Once | INTRAVENOUS | Status: AC
  Administered 2014-10-10: 07:00:00 via INTRAVENOUS

## 2014-10-10 MED ORDER — ONDANSETRON HCL 4 MG PO TABS: 4.0000 mg | ORAL_TABLET | Freq: Four times a day (QID) | ORAL | Status: DC | PRN

## 2014-10-10 MED ORDER — ACETAMINOPHEN 325 MG PO TABS: 650.0000 mg | ORAL_TABLET | Freq: Four times a day (QID) | ORAL | Status: DC | PRN

## 2014-10-10 MED ORDER — LORAZEPAM 2 MG/ML IJ SOLN: 0.0000 mg | Freq: Four times a day (QID) | INTRAMUSCULAR | Status: AC

## 2014-10-10 MED ORDER — SENNOSIDES-DOCUSATE SODIUM 8.6-50 MG PO TABS: 1.0000 | ORAL_TABLET | Freq: Every evening | ORAL | Status: DC | PRN

## 2014-10-10 MED ORDER — HYDROCHLOROTHIAZIDE 12.5 MG PO CAPS
12.5000 mg | ORAL_CAPSULE | Freq: Every day | ORAL | Status: DC
  Administered 2014-10-10 – 2014-10-13 (×4): 12.5 mg via ORAL
  Filled 2014-10-10 (×4): qty 1

## 2014-10-10 MED ORDER — POTASSIUM CHLORIDE 10 MEQ/100ML IV SOLN
10.0000 meq | INTRAVENOUS | Status: AC
  Administered 2014-10-10 (×6): 10 meq via INTRAVENOUS
  Filled 2014-10-10 (×6): qty 100

## 2014-10-10 MED ORDER — AMLODIPINE BESYLATE 5 MG PO TABS
5.0000 mg | ORAL_TABLET | Freq: Every day | ORAL | Status: DC
  Administered 2014-10-10 – 2014-10-13 (×4): 5 mg via ORAL
  Filled 2014-10-10 (×4): qty 1

## 2014-10-10 MED ORDER — LISINOPRIL-HYDROCHLOROTHIAZIDE 20-12.5 MG PO TABS: 1.0000 | ORAL_TABLET | Freq: Every day | ORAL | Status: DC

## 2014-10-10 MED ORDER — CHLORDIAZEPOXIDE HCL 25 MG PO CAPS
25.0000 mg | ORAL_CAPSULE | Freq: Two times a day (BID) | ORAL | Status: DC
  Administered 2014-10-10 – 2014-10-13 (×7): 25 mg via ORAL
  Filled 2014-10-10 (×7): qty 1

## 2014-10-10 MED ORDER — LORAZEPAM 1 MG PO TABS: 1.0000 mg | ORAL_TABLET | Freq: Four times a day (QID) | ORAL | Status: AC | PRN

## 2014-10-10 MED ORDER — LISINOPRIL 10 MG PO TABS
20.0000 mg | ORAL_TABLET | Freq: Every day | ORAL | Status: DC
  Administered 2014-10-10 – 2014-10-13 (×4): 20 mg via ORAL
  Filled 2014-10-10 (×4): qty 2

## 2014-10-10 MED ORDER — SODIUM CHLORIDE 0.9 % IJ SOLN
3.0000 mL | Freq: Two times a day (BID) | INTRAMUSCULAR | Status: DC
  Administered 2014-10-10 – 2014-10-11 (×2): 3 mL via INTRAVENOUS

## 2014-10-10 MED ORDER — ACETAMINOPHEN 650 MG RE SUPP: 650.0000 mg | Freq: Four times a day (QID) | RECTAL | Status: DC | PRN

## 2014-10-10 MED ORDER — PHENYTOIN SODIUM EXTENDED 100 MG PO CAPS
100.0000 mg | ORAL_CAPSULE | Freq: Three times a day (TID) | ORAL | Status: DC
  Administered 2014-10-10 – 2014-10-13 (×10): 100 mg via ORAL
  Filled 2014-10-10 (×10): qty 1

## 2014-10-10 MED ORDER — SODIUM CHLORIDE 0.9 % IV SOLN
80.0000 mg | Freq: Once | INTRAVENOUS | Status: AC
  Administered 2014-10-10: 80 mg via INTRAVENOUS
  Filled 2014-10-10: qty 80

## 2014-10-10 MED ORDER — SODIUM CHLORIDE 0.9 % IV SOLN
INTRAVENOUS | Status: DC
  Administered 2014-10-10 – 2014-10-11 (×3): via INTRAVENOUS

## 2014-10-10 MED ORDER — POTASSIUM CHLORIDE 10 MEQ/100ML IV SOLN
10.0000 meq | Freq: Once | INTRAVENOUS | Status: AC
  Administered 2014-10-10: 10 meq via INTRAVENOUS
  Filled 2014-10-10: qty 100

## 2014-10-10 MED ORDER — PANTOPRAZOLE SODIUM 40 MG PO TBEC
40.0000 mg | DELAYED_RELEASE_TABLET | Freq: Every day | ORAL | Status: DC
  Administered 2014-10-10 – 2014-10-13 (×4): 40 mg via ORAL
  Filled 2014-10-10 (×4): qty 1

## 2014-10-10 MED ORDER — ALBUTEROL SULFATE (2.5 MG/3ML) 0.083% IN NEBU: 2.5000 mg | INHALATION_SOLUTION | RESPIRATORY_TRACT | Status: DC | PRN

## 2014-10-10 MED ORDER — VITAMIN B-1 100 MG PO TABS
100.0000 mg | ORAL_TABLET | Freq: Every day | ORAL | Status: DC
  Administered 2014-10-10 – 2014-10-13 (×4): 100 mg via ORAL
  Filled 2014-10-10 (×4): qty 1

## 2014-10-10 MED ORDER — ONDANSETRON HCL 4 MG/2ML IJ SOLN
INTRAMUSCULAR | Status: AC
  Administered 2014-10-10: 4 mg
  Filled 2014-10-10: qty 2

## 2014-10-10 NOTE — ED Notes (Signed)
Pt vomited large amount of dark red/brown emesis. Pt cleaned, linen changed, zofran given. Nad noted.

## 2014-10-10 NOTE — H&P (Signed)
[facility]          History and Physical    PCP:   [name],TESFAYE, MD   Chief Complaint:  Found unconscious  HPI: Patient is a 58 year old woman with history of alcohol abuse and a seizure disorder as well as hypertension who was found down this morning at her house unresponsive. Patient endorses drinking a lot of alcohol the day prior to admission, however is unable to quantify to me how much. In the emergency department she is found to have an alcohol level of 288, a potassium of 2.7, lactic acid of 4.4 and tachycardic into the 120s; hence we have been asked to admit her for further evaluation and management.  Allergies:   Allergies  Allergen Reactions  . Other     Unable to recall med      Past Medical History  Diagnosis Date  . Stroke     age 78, required brain surgery  . Seizures     since stroke, no recent seizures  . HTN (hypertension)   . PUD (peptic ulcer disease)     remote  . Shortness of breath     with exertion  . Contracture of muscle of hand     right  . Alcohol abuse   . Chronic abdominal pain   . Chronic diarrhea     Past Surgical History  Procedure Laterality Date  . Abdominal hysterectomy  2004    complete  . Right tib-fib fracture  2008  . Brain surgery      age 45, stroke  . Colonoscopy with propofol  10/03/2012    Procedure: COLONOSCOPY WITH PROPOFOL;  Surgeon: Danie Binder, MD;  Location: [facility];  Service: Endoscopy;  Laterality: N/A;  1230 in cecum, total withdrawel time 18 minutes, done at 1248  . Esophagogastroduodenoscopy (egd) with propofol  10/03/2012    Procedure: ESOPHAGOGASTRODUODENOSCOPY (EGD) WITH PROPOFOL;  Surgeon: Danie Binder, MD;  Location: [facility];  Service: Endoscopy;  Laterality: N/A;  started at 1254  . Savory dilation  10/03/2012    Procedure: SAVORY DILATION;  Surgeon: Danie Binder, MD;  Location: [facility];  Service: Endoscopy;  Laterality: N/A;  started at 1303,  dilated 12.8-16mm    Prior  to Admission medications   Medication Sig Start Date End Date Taking? Authorizing Provider  albuterol (PROVENTIL HFA;VENTOLIN HFA) 108 (90 BASE) MCG/ACT inhaler Inhale 2 puffs into the lungs every 6 (six) hours as needed. FOR SHORTNESS OF BREATH AND/OR WHEEZING    Historical Provider, MD  amLODipine (NORVASC) 5 MG tablet Take 5 mg by mouth daily.    Historical Provider, MD  azithromycin (ZITHROMAX) 250 MG tablet 1 tablet daily starting Monday at dinnertime. 08/11/14   [name], MD  chlordiazePOXIDE (LIBRIUM) 25 MG capsule Take 25 mg by mouth 2 (two) times daily. 10/03/12   Danie Binder, MD  lisinopril-hydrochlorothiazide (PRINZIDE,ZESTORETIC) 20-12.5 MG per tablet Take 1 tablet by mouth daily.    Historical Provider, MD  omeprazole (PRILOSEC) 20 MG capsule Take 20 mg by mouth daily.    Historical Provider, MD  PHENobarbital (LUMINAL) 32.4 MG tablet Take 32.4 mg by mouth daily.  09/04/12   Historical Provider, MD  phenytoin (DILANTIN) 100 MG ER capsule Take 100 mg by mouth 3 (three) times daily.    Historical Provider, MD  predniSONE (DELTASONE) 50 MG tablet 1 tablet daily for 4 days, one half tablet daily  for 4 days 08/11/14   [name], MD  traMADol (ULTRAM) 50 MG tablet Take 1 tablet (50 mg total) by mouth every 6 (six) hours as needed for pain. 08/17/13   Orpah Greek, MD    Social History:  reports that she quit smoking about 2 months ago. Her smoking use included Cigarettes. She has a .36 pack-year smoking history. She has never used smokeless tobacco. She reports that she drinks alcohol. She reports that she does not use illicit drugs.  Family History  Problem Relation Age of Onset  . Colon cancer Neg Hx   . Liver disease Sister     etoh    Review of Systems:  Unable to obtain given lethargy  Physical Exam: Blood pressure 139/81, pulse 108, temperature 97.6 F (36.4 C), temperature source Oral, resp. rate 18, height 5' 1"  (1.549 m), weight 61.689 kg (136 lb), SpO2  100 %. General: Drowsy, awakens briefly to voice or sternal rub and is only able to answer very simple questions. HEENT: Normocephalic, atraumatic, pupils equal round and reactive to light, pharyngeal erythema, dry mucous membranes. Neck: Supple, no JVD, no lymphadenopathy, no bruits, no goiter. Cardiovascular: Tachycardic, regular, no murmurs, rubs or gallops. Lungs: Clear to auscultation bilaterally. Excellent abdomen: Soft, nontender, nondistended, positive bowel sounds, no masses or organomegaly noted.  Extremities: No clubbing, cyanosis or edema, positive pedal pulses. Neurologic: Unable to fully evaluate given current mental state.  Labs on Admission:  Results for orders placed or performed during the hospital encounter of 10/10/14 (from the past 48 hour(s))  Comprehensive metabolic panel     Status: Abnormal   Collection Time: 10/10/14  7:41 AM  Result Value Ref Range   Sodium 146 (H) 135 - 145 mmol/L    Comment: DELTA CHECK NOTED Please note change in reference range.    Potassium 2.7 (LL) 3.5 - 5.1 mmol/L    Comment: CRITICAL RESULT CALLED TO, READ BACK BY AND VERIFIED WITH: HONETCUTT,C AT 8:15AM ON 10/10/14 BY FESTERMAN,C Please note change in reference range.    Chloride 107 96 - 112 mEq/L   CO2 28 19 - 32 mmol/L   Glucose, Bld 131 (H) 70 - 99 mg/dL   BUN <5 (L) 6 - 23 mg/dL   Creatinine, Ser 0.40 (L) 0.50 - 1.10 mg/dL   Calcium 7.9 (L) 8.4 - 10.5 mg/dL   Total Protein 7.3 6.0 - 8.3 g/dL   Albumin 2.7 (L) 3.5 - 5.2 g/dL   AST 134 (H) 0 - 37 U/L   ALT 47 (H) 0 - 35 U/L   Alkaline Phosphatase 107 39 - 117 U/L   Total Bilirubin 0.4 0.3 - 1.2 mg/dL   GFR calc non Af Amer >90 >90 mL/min   GFR calc Af Amer >90 >90 mL/min    Comment: (NOTE) The eGFR has been calculated using the CKD EPI equation. This calculation has not been validated in all clinical situations. eGFR's persistently <90 mL/min signify possible Chronic Kidney Disease.    Anion gap 11 5 - 15  Ethanol      Status: Abnormal   Collection Time: 10/10/14  7:41 AM  Result Value Ref Range   Alcohol, Ethyl (B) 388 (H) 0 - 9 mg/dL    Comment:        LOWEST DETECTABLE LIMIT FOR SERUM ALCOHOL IS 11 mg/dL FOR MEDICAL PURPOSES ONLY   CBC with Differential     Status: Abnormal   Collection Time: 10/10/14  7:41 AM  Result Value Ref  Range   WBC 9.8 4.0 - 10.5 K/uL   RBC 4.02 3.87 - 5.11 MIL/uL   Hemoglobin 12.6 12.0 - 15.0 g/dL   HCT 37.7 36.0 - 46.0 %   MCV 93.8 78.0 - 100.0 fL   MCH 31.3 26.0 - 34.0 pg   MCHC 33.4 30.0 - 36.0 g/dL   RDW 14.2 11.5 - 15.5 %   Platelets 215 150 - 400 K/uL   Neutrophils Relative % 80 (H) 43 - 77 %   Neutro Abs 7.8 (H) 1.7 - 7.7 K/uL   Lymphocytes Relative 15 12 - 46 %   Lymphs Abs 1.5 0.7 - 4.0 K/uL   Monocytes Relative 5 3 - 12 %   Monocytes Absolute 0.5 0.1 - 1.0 K/uL   Eosinophils Relative 0 0 - 5 %   Eosinophils Absolute 0.0 0.0 - 0.7 K/uL   Basophils Relative 0 0 - 1 %   Basophils Absolute 0.0 0.0 - 0.1 K/uL  Lactic acid, plasma     Status: Abnormal   Collection Time: 10/10/14  7:41 AM  Result Value Ref Range   Lactic Acid, Venous 4.4 (H) 0.5 - 2.2 mmol/L  Acetaminophen level     Status: Abnormal   Collection Time: 10/10/14  7:41 AM  Result Value Ref Range   Acetaminophen (Tylenol), Serum <10.0 (L) 10 - 30 ug/mL    Comment:        THERAPEUTIC CONCENTRATIONS VARY SIGNIFICANTLY. A RANGE OF 10-30 ug/mL MAY BE AN EFFECTIVE CONCENTRATION FOR MANY PATIENTS. HOWEVER, SOME ARE BEST TREATED AT CONCENTRATIONS OUTSIDE THIS RANGE. ACETAMINOPHEN CONCENTRATIONS >150 ug/mL AT 4 HOURS AFTER INGESTION AND >50 ug/mL AT 12 HOURS AFTER INGESTION ARE OFTEN ASSOCIATED WITH TOXIC REACTIONS.   Salicylate level     Status: None   Collection Time: 10/10/14  7:41 AM  Result Value Ref Range   Salicylate Lvl <6.4 2.8 - 20.0 mg/dL  Phenobarbital level     Status: Abnormal   Collection Time: 10/10/14  7:41 AM  Result Value Ref Range   Phenobarbital <5.0 (L) 15.0 -  40.0 ug/mL  Phenytoin level, total     Status: Abnormal   Collection Time: 10/10/14  7:41 AM  Result Value Ref Range   Phenytoin Lvl <2.5 (L) 10.0 - 20.0 ug/mL  Urine rapid drug screen (hosp performed)     Status: None   Collection Time: 10/10/14  7:51 AM  Result Value Ref Range   Opiates NONE DETECTED NONE DETECTED   Cocaine NONE DETECTED NONE DETECTED   Benzodiazepines NONE DETECTED NONE DETECTED   Amphetamines NONE DETECTED NONE DETECTED   Tetrahydrocannabinol NONE DETECTED NONE DETECTED   Barbiturates NONE DETECTED NONE DETECTED    Comment:        DRUG SCREEN FOR MEDICAL PURPOSES ONLY.  IF CONFIRMATION IS NEEDED FOR ANY PURPOSE, NOTIFY LAB WITHIN 5 DAYS.        LOWEST DETECTABLE LIMITS FOR URINE DRUG SCREEN Drug Class       Cutoff (ng/mL) Amphetamine      1000 Barbiturate      200 Benzodiazepine   403 Tricyclics       474 Opiates          300 Cocaine          300 THC              50   Lactic acid, plasma     Status: Abnormal   Collection Time: 10/10/14  9:41 AM  Result Value Ref Range  Lactic Acid, Venous 4.1 (H) 0.5 - 2.2 mmol/L  POC occult blood, ED RN will collect     Status: None   Collection Time: 10/10/14  9:41 AM  Result Value Ref Range   Fecal Occult Bld NEGATIVE NEGATIVE  pH, gastric fluid (gastroccult card)     Status: None   Collection Time: 10/10/14  9:49 AM  Result Value Ref Range   pH, Gastric 4   CBG monitoring, ED     Status: Abnormal   Collection Time: 10/10/14 10:22 AM  Result Value Ref Range   Glucose-Capillary 117 (H) 70 - 99 mg/dL    Radiological Exams on Admission: Dg Chest 1 View  10/10/2014   CLINICAL DATA:  Altered mental status  EXAM: CHEST - 1 VIEW  COMPARISON:  08/11/2014  FINDINGS: Patient rotated to the right. Allowing for this positioning, the lungs are clear. Negative for pneumonia. Negative for heart failure or effusion.  Chronic healing fracture right humeral neck.  IMPRESSION: No active disease.   Electronically Signed   By:  Franchot Gallo M.D.   On: 10/10/2014 07:30   Ct Head Wo Contrast  10/10/2014   CLINICAL DATA:  Drug overdose with syncope  EXAM: CT HEAD WITHOUT CONTRAST  TECHNIQUE: Contiguous axial images were obtained from the base of the skull through the vertex without intravenous contrast.  COMPARISON:  October 08, 2010  FINDINGS: There is mild diffuse atrophy. Enlargement of both anterior horn lateral ventricles, larger on the left than on the right, is due to ex vacuo phenomenon. There is a calcified aneurysm remnant at the distal most aspect of the right internal carotid artery measuring 1.0 x 1.0 cm, stable trauma with an adjacent aneurysm clip which also appears stable. There is no intracranial mass, acute hemorrhage, extra-axial fluid collection, or midline shift. 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 These infarcts appear stable in size and distribution. There is small vessel disease in the centra semiovale bilaterally, more on the left than on the right, stable. There is no new gray-white compartment lesion. No acute infarct apparent. Bony calvarium appears intact except for stable postoperative craniotomy changes on the right. The mastoid air cells are clear.  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 Calcified remains of this aneurysm are stable. There is no mass, hemorrhage, or acute appearing infarct. 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 There is mild atrophy with ex vacuo phenomenon causing enlargement of the frontal horns of each lateral ventricle, more on the left than on the right, stable. Appearance is essentially stable compared to prior study.   Electronically Signed   By: Lowella Grip M.D.   On: 10/10/2014 07:30    Assessment/Plan Principal Problem:   Alcohol intoxication Active Problems:   Sinus tachycardia   Hypokalemia    Seizure disorder    Acute alcohol intoxication in a chronic alcohol user -Admit to the hospital for observation, start CIWA protocol and manage for withdrawals. -Fluids, supportive care.  Hypokalemia -Replete IV, check magnesium level and replete as necessary.  Seizure disorder -Continue home dose of phenobarbital and Dilantin.  Sinus tachycardia  -Likely secondary to dehydration from decreased by mouth intake, less likely from alcohol withdrawals given her alcohol level is still currently 288. Next an-IV fluids and treat supportively.  Lactic acidosis -Likely from dehydration and starvation ketoacidosis. -IV fluids.  DVT  prophylaxis -Subcutaneous heparin  CODE STATUS -Assume full code as I'm not able to have this discussion with patient currently.   Dr. Legrand Rams to assume care of this patient in the morning.    Time Spent on Admission: 70 minutes  HERNANDEZ ACOSTA,Poetry Cerro [facility] Pager: [phone] 10/10/2014, 3:07 PM

## 2014-10-10 NOTE — Progress Notes (Signed)
Patient's sister, Raquel Sarna, called to check on patient.  I informed her that the patient was resting and doing fine.  She asked why the patient was admitted to the hospital.  I informed her that I could not give her any patient information without the patient's consent and the patient was currently sleeping.  I gave Raquel Sarna the patient's room number so she could call her later.

## 2014-10-10 NOTE — ED Provider Notes (Signed)
Pt received at shift sign out. Pt found unresponsive by others in her home, pt endorsed drinking a lot of etoh. Pt persistently tachycardic despite IVF bolus x2, as well as lactic acid remained elevated. Potassium repleted by IV. Pt vomited coffee ground emesis x1. Stool is heme negative. Hx PUD. Will start IV protonix bolus and gtt, continue IVF, admit. T/C to [facility] Dr. Jerilee Hoh, case discussed, including:  HPI, pertinent PM/SHx, VS/PE, dx testing, ED course and treatment:  Agreeable to admit, requests to write temporary orders, obtain tele bed to Dr. Josephine Cables service.  Results for orders placed or performed during the hospital encounter of 10/10/14  Comprehensive metabolic panel  Result Value Ref Range   Sodium 146 (H) 135 - 145 mmol/L   Potassium 2.7 (LL) 3.5 - 5.1 mmol/L   Chloride 107 96 - 112 mEq/L   CO2 28 19 - 32 mmol/L   Glucose, Bld 131 (H) 70 - 99 mg/dL   BUN <5 (L) 6 - 23 mg/dL   Creatinine, Ser 0.40 (L) 0.50 - 1.10 mg/dL   Calcium 7.9 (L) 8.4 - 10.5 mg/dL   Total Protein 7.3 6.0 - 8.3 g/dL   Albumin 2.7 (L) 3.5 - 5.2 g/dL   AST 134 (H) 0 - 37 U/L   ALT 47 (H) 0 - 35 U/L   Alkaline Phosphatase 107 39 - 117 U/L   Total Bilirubin 0.4 0.3 - 1.2 mg/dL   GFR calc non Af Amer >90 >90 mL/min   GFR calc Af Amer >90 >90 mL/min   Anion gap 11 5 - 15  Ethanol  Result Value Ref Range   Alcohol, Ethyl (B) 388 (H) 0 - 9 mg/dL  CBC with Differential  Result Value Ref Range   WBC 9.8 4.0 - 10.5 K/uL   RBC 4.02 3.87 - 5.11 MIL/uL   Hemoglobin 12.6 12.0 - 15.0 g/dL   HCT 37.7 36.0 - 46.0 %   MCV 93.8 78.0 - 100.0 fL   MCH 31.3 26.0 - 34.0 pg   MCHC 33.4 30.0 - 36.0 g/dL   RDW 14.2 11.5 - 15.5 %   Platelets 215 150 - 400 K/uL   Neutrophils Relative % 80 (H) 43 - 77 %   Neutro Abs 7.8 (H) 1.7 - 7.7 K/uL   Lymphocytes Relative 15 12 - 46 %   Lymphs Abs 1.5 0.7 - 4.0 K/uL   Monocytes Relative 5 3 - 12 %   Monocytes Absolute 0.5 0.1 - 1.0 K/uL   Eosinophils Relative 0 0 - 5 %   Eosinophils Absolute 0.0 0.0 - 0.7 K/uL   Basophils Relative 0 0 - 1 %   Basophils Absolute 0.0 0.0 - 0.1 K/uL  Urine rapid drug screen (hosp performed)  Result Value Ref Range   Opiates NONE DETECTED NONE DETECTED   Cocaine NONE DETECTED NONE DETECTED   Benzodiazepines NONE DETECTED NONE DETECTED   Amphetamines NONE DETECTED NONE DETECTED   Tetrahydrocannabinol NONE DETECTED NONE DETECTED   Barbiturates NONE DETECTED NONE DETECTED  Lactic acid, plasma  Result Value Ref Range   Lactic Acid, Venous 4.4 (H) 0.5 - 2.2 mmol/L  Acetaminophen level  Result Value Ref Range   Acetaminophen (Tylenol), Serum <10.0 (L) 10 - 30 ug/mL  Salicylate level  Result Value Ref Range   Salicylate Lvl <9.0 2.8 - 20.0 mg/dL  Phenobarbital level  Result Value Ref Range   Phenobarbital <5.0 (L) 15.0 - 40.0 ug/mL  Phenytoin level, total  Result Value Ref Range  Phenytoin Lvl <2.5 (L) 10.0 - 20.0 ug/mL  Lactic acid, plasma  Result Value Ref Range   Lactic Acid, Venous 4.1 (H) 0.5 - 2.2 mmol/L  POC occult blood, ED RN will collect  Result Value Ref Range   Fecal Occult Bld NEGATIVE NEGATIVE   Dg Chest 1 View 10/10/2014   CLINICAL DATA:  Altered mental status  EXAM: CHEST - 1 VIEW  COMPARISON:  08/11/2014  FINDINGS: Patient rotated to the right. Allowing for this positioning, the lungs are clear. Negative for pneumonia. Negative for heart failure or effusion.  Chronic healing fracture right humeral neck.  IMPRESSION: No active disease.   Electronically Signed   By: Franchot Gallo M.D.   On: 10/10/2014 07:30   Ct Head Wo Contrast 10/10/2014   CLINICAL DATA:  Drug overdose with syncope  EXAM: CT HEAD WITHOUT CONTRAST  TECHNIQUE: Contiguous axial images were obtained from the base of the skull through the vertex without intravenous contrast.  COMPARISON:  October 08, 2010  FINDINGS: There is mild diffuse atrophy. Enlargement of both anterior horn lateral ventricles, larger on the left than on the right, is  due to ex vacuo phenomenon. There is a calcified aneurysm remnant at the distal most aspect of the right internal carotid artery measuring 1.0 x 1.0 cm, stable trauma with an adjacent aneurysm clip which also appears stable. There is no intracranial mass, acute hemorrhage, extra-axial fluid collection, or midline shift. 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 These infarcts appear stable in size and distribution. There is small vessel disease in the centra semiovale bilaterally, more on the left than on the right, stable. There is no new gray-white compartment lesion. No acute infarct apparent. Bony calvarium appears intact except for stable postoperative craniotomy changes on the right. The mastoid air cells are clear.  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 Calcified remains of this aneurysm are stable. There is no mass, hemorrhage, or acute appearing infarct. 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 There is mild atrophy with ex vacuo phenomenon causing enlargement of the frontal horns of each lateral ventricle, more on the left than on the right, stable. Appearance is essentially stable compared to prior study.   Electronically Signed   By: Lowella Grip M.D.   On: 10/10/2014 07:30       Francine Graven, DO 10/10/14 1056

## 2014-10-10 NOTE — ED Notes (Signed)
Pt vomited small amount (30cc) dark red/brown emesis. Gastric Occult blood card sent to lab, bedside hemoccult negative.

## 2014-10-10 NOTE — ED Notes (Signed)
etoh on board, with oxycodone and passed out. 2 narcan given in field. Awake and responsive.

## 2014-10-10 NOTE — ED Provider Notes (Signed)
CSN: [id number]     Arrival date & time 10/10/14  9450 History   First MD Initiated Contact with Patient 10/10/14 [phone]     Chief Complaint  Patient presents with  . Alcohol Intoxication     (Consider location/radiation/quality/duration/timing/severity/associated sxs/prior Treatment) Patient is a 58 y.o. female presenting with intoxication. The history is provided by the EMS personnel. The history is limited by the condition of the patient (Altered mental status).  Alcohol Intoxication  She was brought in by EMS who were called to the home because she was unresponsive. Fire rescue had been called out because of hemorrhage but found the patient unresponsive except to sternal rub. Apparently, the person at her home stated that she had drunk a lot of alcohol and taken some hydrocodone tablets. She was given naloxone with improvement in mentation. Patient is not able to give any coherent history other than stating that she hurts all over.  Past Medical History  Diagnosis Date  . Stroke     age 60, required brain surgery  . Seizures     since stroke, no recent seizures  . HTN (hypertension)   . PUD (peptic ulcer disease)     remote  . Shortness of breath     with exertion  . Contracture of muscle of hand     right  . Alcohol abuse   . Chronic abdominal pain   . Chronic diarrhea    Past Surgical History  Procedure Laterality Date  . Abdominal hysterectomy  2004    complete  . Right tib-fib fracture  2008  . Brain surgery      age 21, stroke  . Colonoscopy with propofol  10/03/2012    Procedure: COLONOSCOPY WITH PROPOFOL;  Surgeon: Danie Binder, MD;  Location: [facility];  Service: Endoscopy;  Laterality: N/A;  1230 in cecum, total withdrawel time 18 minutes, done at 1248  . Esophagogastroduodenoscopy (egd) with propofol  10/03/2012    Procedure: ESOPHAGOGASTRODUODENOSCOPY (EGD) WITH PROPOFOL;  Surgeon: Danie Binder, MD;  Location: [facility];  Service: Endoscopy;  Laterality: N/A;   started at 1254  . Savory dilation  10/03/2012    Procedure: SAVORY DILATION;  Surgeon: Danie Binder, MD;  Location: [facility];  Service: Endoscopy;  Laterality: N/A;  started at 1303,  dilated 12.8-16mm   Family History  Problem Relation Age of Onset  . Colon cancer Neg Hx   . Liver disease Sister     etoh   History  Substance Use Topics  . Smoking status: Former Smoker -- 0.04 packs/day for 9 years    Types: Cigarettes    Quit date: 07/28/2014  . Smokeless tobacco: Never Used  . Alcohol Use: Yes     Comment: last drank on Monday   OB History    Gravida Para Term Preterm AB TAB SAB Ectopic Multiple Living   4 3 3  1  1   3      Review of Systems  Unable to perform ROS: Mental status change      Allergies  Other  Home Medications   Prior to Admission medications   Medication Sig Start Date End Date Taking? Authorizing Provider  albuterol (PROVENTIL HFA;VENTOLIN HFA) 108 (90 BASE) MCG/ACT inhaler Inhale 2 puffs into the lungs every 6 (six) hours as needed. FOR SHORTNESS OF BREATH AND/OR WHEEZING    Historical Provider, MD  amLODipine (NORVASC) 5 MG tablet Take 5 mg by mouth daily.    Historical Provider, MD  azithromycin (  ZITHROMAX) 250 MG tablet 1 tablet daily starting Monday at dinnertime. 08/11/14   [name], MD  chlordiazePOXIDE (LIBRIUM) 25 MG capsule Take 25 mg by mouth 2 (two) times daily. 10/03/12   Danie Binder, MD  lisinopril-hydrochlorothiazide (PRINZIDE,ZESTORETIC) 20-12.5 MG per tablet Take 1 tablet by mouth daily.    Historical Provider, MD  omeprazole (PRILOSEC) 20 MG capsule Take 20 mg by mouth daily.    Historical Provider, MD  PHENobarbital (LUMINAL) 32.4 MG tablet Take 32.4 mg by mouth daily.  09/04/12   Historical Provider, MD  phenytoin (DILANTIN) 100 MG ER capsule Take 100 mg by mouth 3 (three) times daily.    Historical Provider, MD  predniSONE (DELTASONE) 50 MG tablet 1 tablet daily for 4 days, one half tablet daily for 4 days 08/11/14   [name], MD  traMADol (ULTRAM) 50 MG tablet Take 1 tablet (50 mg total) by mouth every 6 (six) hours as needed for pain. 08/17/13   Orpah Greek, MD   BP 126/73 mmHg  Pulse 115  Temp(Src) 97.6 F (36.4 C) (Oral)  Resp 20  Ht 5\' 1"  (1.549 m)  Wt 136 lb (61.689 kg)  BMI 25.71 kg/m2  SpO2 100% Physical Exam  Nursing note and vitals reviewed.  58 year old female, resting comfortably and in no acute distress. Vital signs are significant for tachycardia. Oxygen saturation is 100%, which is normal. Head is normocephalic and atraumatic. PERRLA, EOMI. Oropharynx is clear. Neck is nontender and supple without adenopathy or JVD. Back is nontender and there is no CVA tenderness. Lungs are clear without rales, wheezes, or rhonchi. Chest is nontender. Heart has regular rate and rhythm without murmur. Abdomen is soft, flat, nontender without masses or hepatosplenomegaly and peristalsis is normoactive. Extremities have no cyanosis or edema, full range of motion is present. Skin is warm and dry without rash. Neurologic: She is awake and alert and oriented to person but not place or time, cranial nerves are intact. 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  ED Course  Procedures (including critical care time) Labs Review Results for orders placed or performed during the hospital encounter of 10/10/14  Comprehensive metabolic panel  Result Value Ref Range   Sodium 146 (H) 135 - 145 mmol/L   Potassium 2.7 (LL) 3.5 - 5.1 mmol/L   Chloride 107 96 - 112 mEq/L   CO2 28 19 - 32 mmol/L   Glucose, Bld 131 (H) 70 - 99 mg/dL   BUN <5 (L) 6 - 23 mg/dL   Creatinine, Ser 0.40 (L) 0.50 - 1.10 mg/dL   Calcium 7.9 (L) 8.4 - 10.5 mg/dL   Total Protein 7.3 6.0 - 8.3 g/dL   Albumin 2.7 (L) 3.5 - 5.2 g/dL   AST 134 (H) 0 - 37 U/L   ALT 47 (H) 0 - 35 U/L   Alkaline Phosphatase 107 39 - 117 U/L   Total Bilirubin 0.4 0.3 - 1.2 mg/dL   GFR calc non Af Amer >90 >90 mL/min   GFR calc Af Amer  >90 >90 mL/min   Anion gap 11 5 - 15  Ethanol  Result Value Ref Range   Alcohol, Ethyl (B) 388 (H) 0 - 9 mg/dL  CBC with Differential  Result Value Ref Range   WBC 9.8 4.0 - 10.5 K/uL   RBC 4.02 3.87 - 5.11 MIL/uL   Hemoglobin 12.6 12.0 - 15.0 g/dL   HCT 37.7 36.0 - 46.0 %   MCV 93.8 78.0 -  100.0 fL   MCH 31.3 26.0 - 34.0 pg   MCHC 33.4 30.0 - 36.0 g/dL   RDW 14.2 11.5 - 15.5 %   Platelets 215 150 - 400 K/uL   Neutrophils Relative % 80 (H) 43 - 77 %   Neutro Abs 7.8 (H) 1.7 - 7.7 K/uL   Lymphocytes Relative 15 12 - 46 %   Lymphs Abs 1.5 0.7 - 4.0 K/uL   Monocytes Relative 5 3 - 12 %   Monocytes Absolute 0.5 0.1 - 1.0 K/uL   Eosinophils Relative 0 0 - 5 %   Eosinophils Absolute 0.0 0.0 - 0.7 K/uL   Basophils Relative 0 0 - 1 %   Basophils Absolute 0.0 0.0 - 0.1 K/uL  Urine rapid drug screen (hosp performed)  Result Value Ref Range   Opiates NONE DETECTED NONE DETECTED   Cocaine NONE DETECTED NONE DETECTED   Benzodiazepines NONE DETECTED NONE DETECTED   Amphetamines NONE DETECTED NONE DETECTED   Tetrahydrocannabinol NONE DETECTED NONE DETECTED   Barbiturates NONE DETECTED NONE DETECTED  Lactic acid, plasma  Result Value Ref Range   Lactic Acid, Venous 4.4 (H) 0.5 - 2.2 mmol/L  Acetaminophen level  Result Value Ref Range   Acetaminophen (Tylenol), Serum <10.0 (L) 10 - 30 ug/mL  Salicylate level  Result Value Ref Range   Salicylate Lvl <0.9 2.8 - 20.0 mg/dL  Phenobarbital level  Result Value Ref Range   Phenobarbital <5.0 (L) 15.0 - 40.0 ug/mL  Phenytoin level, total  Result Value Ref Range   Phenytoin Lvl <2.5 (L) 10.0 - 20.0 ug/mL   Imaging Review Dg Chest 1 View  10/10/2014   CLINICAL DATA:  Altered mental status  EXAM: CHEST - 1 VIEW  COMPARISON:  08/11/2014  FINDINGS: Patient rotated to the right. Allowing for this positioning, the lungs are clear. Negative for pneumonia. Negative for heart failure or effusion.  Chronic healing fracture right humeral neck.   IMPRESSION: No active disease.   Electronically Signed   By: Franchot Gallo M.D.   On: 10/10/2014 07:30   Ct Head Wo Contrast  10/10/2014   CLINICAL DATA:  Drug overdose with syncope  EXAM: CT HEAD WITHOUT CONTRAST  TECHNIQUE: Contiguous axial images were obtained from the base of the skull through the vertex without intravenous contrast.  COMPARISON:  October 08, 2010  FINDINGS: There is mild diffuse atrophy. Enlargement of both anterior horn lateral ventricles, larger on the left than on the right, is due to ex vacuo phenomenon. There is a calcified aneurysm remnant at the distal most aspect of the right internal carotid artery measuring 1.0 x 1.0 cm, stable trauma with an adjacent aneurysm clip which also appears stable. There is no intracranial mass, acute hemorrhage, extra-axial fluid collection, or midline shift. 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 These infarcts appear stable in size and distribution. There is small vessel disease in the centra semiovale bilaterally, more on the left than on the right, stable. There is no new gray-white compartment lesion. No acute infarct apparent. Bony calvarium appears intact except for stable postoperative craniotomy changes on the right. The mastoid air cells are clear.  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 Calcified remains of this aneurysm are stable. There is no mass, hemorrhage, or acute appearing infarct. 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 There is mild  atrophy with ex vacuo phenomenon causing enlargement of the frontal horns of each lateral ventricle, more on the left than on the right, stable. Appearance is essentially stable compared to prior study.   Electronically Signed   By: Lowella Grip M.D.   On: 10/10/2014 07:30     EKG Interpretation   Date/Time:  Thursday October 10 2014 07:24:39 EST Ventricular Rate:  114 PR Interval:  147 QRS Duration: 72 QT Interval:  354 QTC Calculation: 487 R Axis:   24 Text Interpretation:  Sinus tachycardia Borderline T wave abnormalities  Borderline prolonged QT interval When compared with ECG of 08/11/2014, QT  has shortened Confirmed by [facility]  MD, Sherrick Araki ([id number]) on 10/10/2014 7:27:02  AM      CRITICAL CARE Performed by: [email or web address] Total critical care time: 35 minutes Critical care time was exclusive of separately billable procedures and treating other patients. Critical care was necessary to treat or prevent imminent or life-threatening deterioration. Critical care was time spent personally by me on the following activities: development of treatment plan with patient and/or surrogate as well as nursing, discussions with consultants, evaluation of patient's response to treatment, examination of patient, obtaining history from patient or surrogate, ordering and performing treatments and interventions, ordering and review of laboratory studies, ordering and review of radiographic studies, pulse oximetry and re-evaluation of patient's condition.  MDM   Final diagnoses:  Altered mental status, unspecified altered mental status type  Alcohol intoxication, uncomplicated  Hypernatremia  Hypokalemia  Elevated lactic acid level  Elevated transaminase level    Altered mental status which is likely due to a combination of alcohol and narcotic drug ingestion. Workup has been initiated.  Drug screen is come back negative but ethanol level has come back very high at 288. Lactic acid level is also somewhat elevated so she is clearly somewhat dehydrated. She's given aggressive IV hydration. She is hypokalemic and is given potassium intravenously. Head CT and chest x-ray showed no acute changes. She will be observed in the ED and lactic acid level repeated. Currently, she is awake and alert and responsive. Case is endorsed to Dr.  Thurnell Garbe.  Delora Fuel, MD [id number]

## 2014-10-11 LAB — BASIC METABOLIC PANEL
Anion gap: 7 (ref 5–15)
BUN: 7 mg/dL (ref 6–23)
CO2: 29 mmol/L (ref 19–32)
Calcium: 7.8 mg/dL — ABNORMAL LOW (ref 8.4–10.5)
Chloride: 106 mEq/L (ref 96–112)
Creatinine, Ser: 0.47 mg/dL — ABNORMAL LOW (ref 0.50–1.10)
GFR calc Af Amer: >90 mL/min (ref >90)
GFR calc non Af Amer: >90 mL/min (ref >90)
Glucose, Bld: 78 mg/dL (ref 70–99)
Potassium: 3.5 mmol/L (ref 3.5–5.1)
Sodium: 142 mmol/L (ref 135–145)

## 2014-10-11 LAB — CBC
HCT: 32.5 % — ABNORMAL LOW (ref 36.0–46.0)
Hemoglobin: 10.8 g/dL — ABNORMAL LOW (ref 12.0–15.0)
MCH: 31.8 pg (ref 26.0–34.0)
MCHC: 33.2 g/dL (ref 30.0–36.0)
MCV: 95.6 fL (ref 78.0–100.0)
Platelets: 170 10*3/uL (ref 150–400)
RBC: 3.4 MIL/uL — ABNORMAL LOW (ref 3.87–5.11)
RDW: 14.5 % (ref 11.5–15.5)
WBC: 6.4 10*3/uL (ref 4.0–10.5)

## 2014-10-11 MED ORDER — POTASSIUM CHLORIDE CRYS ER 20 MEQ PO TBCR
20.0000 meq | EXTENDED_RELEASE_TABLET | Freq: Two times a day (BID) | ORAL | Status: DC
  Administered 2014-10-11 – 2014-10-13 (×5): 20 meq via ORAL
  Filled 2014-10-11 (×5): qty 1

## 2014-10-11 NOTE — Progress Notes (Signed)
Subjective: She was admitted with alcohol intoxication. He has not shown any signs of alcohol withdrawal yet and she is on the protocol.  Objective: Vital signs in last 24 hours: Temp:  [97.9 F (36.6 C)-98.1 F (36.7 C)] 98.1 F (36.7 C) (12/25 0600) Pulse Rate:  [94-134] 109 (12/25 0905) Resp:  [17-24] 17 (12/25 0905) BP: (92-154)/(58-99) 116/76 mmHg (12/25 0905) SpO2:  [92 %-100 %] 98 % (12/25 0905) Weight:  [61.689 kg (136 lb)] 61.689 kg (136 lb) (12/24 1726) Weight change: -0 kg (-0 oz) Last BM Date: 10/10/14  Intake/Output from previous day: 12/24 0701 - 12/25 0700 In: 1325 [I.V.:1325] Out: 563 [Urine:563]  PHYSICAL EXAM General appearance: alert, no distress and She still seems somewhat intoxicated Resp: clear to auscultation bilaterally Cardio: regular rate and rhythm, S1, S2 normal, no murmur, click, rub or gallop GI: soft, non-tender; bowel sounds normal; no masses,  no organomegaly Extremities: extremities normal, atraumatic, no cyanosis or edema  Lab Results:  Results for orders placed or performed during the hospital encounter of 10/10/14 (from the past 48 hour(s))  Comprehensive metabolic panel     Status: Abnormal   Collection Time: 10/10/14  7:41 AM  Result Value Ref Range   Sodium 146 (H) 135 - 145 mmol/L    Comment: DELTA CHECK NOTED Please note change in reference range.    Potassium 2.7 (LL) 3.5 - 5.1 mmol/L    Comment: CRITICAL RESULT CALLED TO, READ BACK BY AND VERIFIED WITH: HONETCUTT,C AT 8:15AM ON 10/10/14 BY FESTERMAN,C Please note change in reference range.    Chloride 107 96 - 112 mEq/L   CO2 28 19 - 32 mmol/L   Glucose, Bld 131 (H) 70 - 99 mg/dL   BUN <5 (L) 6 - 23 mg/dL   Creatinine, Ser 0.40 (L) 0.50 - 1.10 mg/dL   Calcium 7.9 (L) 8.4 - 10.5 mg/dL   Total Protein 7.3 6.0 - 8.3 g/dL   Albumin 2.7 (L) 3.5 - 5.2 g/dL   AST 134 (H) 0 - 37 U/L   ALT 47 (H) 0 - 35 U/L   Alkaline Phosphatase 107 39 - 117 U/L   Total Bilirubin 0.4 0.3 -  1.2 mg/dL   GFR calc non Af Amer >90 >90 mL/min   GFR calc Af Amer >90 >90 mL/min    Comment: (NOTE) The eGFR has been calculated using the CKD EPI equation. This calculation has not been validated in all clinical situations. eGFR's persistently <90 mL/min signify possible Chronic Kidney Disease.    Anion gap 11 5 - 15  Ethanol     Status: Abnormal   Collection Time: 10/10/14  7:41 AM  Result Value Ref Range   Alcohol, Ethyl (B) 388 (H) 0 - 9 mg/dL    Comment:        LOWEST DETECTABLE LIMIT FOR SERUM ALCOHOL IS 11 mg/dL FOR MEDICAL PURPOSES ONLY   CBC with Differential     Status: Abnormal   Collection Time: 10/10/14  7:41 AM  Result Value Ref Range   WBC 9.8 4.0 - 10.5 K/uL   RBC 4.02 3.87 - 5.11 MIL/uL   Hemoglobin 12.6 12.0 - 15.0 g/dL   HCT 37.7 36.0 - 46.0 %   MCV 93.8 78.0 - 100.0 fL   MCH 31.3 26.0 - 34.0 pg   MCHC 33.4 30.0 - 36.0 g/dL   RDW 14.2 11.5 - 15.5 %   Platelets 215 150 - 400 K/uL   Neutrophils Relative % 80 (H) 43 -  77 %   Neutro Abs 7.8 (H) 1.7 - 7.7 K/uL   Lymphocytes Relative 15 12 - 46 %   Lymphs Abs 1.5 0.7 - 4.0 K/uL   Monocytes Relative 5 3 - 12 %   Monocytes Absolute 0.5 0.1 - 1.0 K/uL   Eosinophils Relative 0 0 - 5 %   Eosinophils Absolute 0.0 0.0 - 0.7 K/uL   Basophils Relative 0 0 - 1 %   Basophils Absolute 0.0 0.0 - 0.1 K/uL  Lactic acid, plasma     Status: Abnormal   Collection Time: 10/10/14  7:41 AM  Result Value Ref Range   Lactic Acid, Venous 4.4 (H) 0.5 - 2.2 mmol/L  Acetaminophen level     Status: Abnormal   Collection Time: 10/10/14  7:41 AM  Result Value Ref Range   Acetaminophen (Tylenol), Serum <10.0 (L) 10 - 30 ug/mL    Comment:        THERAPEUTIC CONCENTRATIONS VARY SIGNIFICANTLY. A RANGE OF 10-30 ug/mL MAY BE AN EFFECTIVE CONCENTRATION FOR MANY PATIENTS. HOWEVER, SOME ARE BEST TREATED AT CONCENTRATIONS OUTSIDE THIS RANGE. ACETAMINOPHEN CONCENTRATIONS >150 ug/mL AT 4 HOURS AFTER INGESTION AND >50 ug/mL AT 12 HOURS  AFTER INGESTION ARE OFTEN ASSOCIATED WITH TOXIC REACTIONS.   Salicylate level     Status: None   Collection Time: 10/10/14  7:41 AM  Result Value Ref Range   Salicylate Lvl <9.8 2.8 - 20.0 mg/dL  Phenobarbital level     Status: Abnormal   Collection Time: 10/10/14  7:41 AM  Result Value Ref Range   Phenobarbital <5.0 (L) 15.0 - 40.0 ug/mL  Phenytoin level, total     Status: Abnormal   Collection Time: 10/10/14  7:41 AM  Result Value Ref Range   Phenytoin Lvl <2.5 (L) 10.0 - 20.0 ug/mL  Urine rapid drug screen (hosp performed)     Status: None   Collection Time: 10/10/14  7:51 AM  Result Value Ref Range   Opiates NONE DETECTED NONE DETECTED   Cocaine NONE DETECTED NONE DETECTED   Benzodiazepines NONE DETECTED NONE DETECTED   Amphetamines NONE DETECTED NONE DETECTED   Tetrahydrocannabinol NONE DETECTED NONE DETECTED   Barbiturates NONE DETECTED NONE DETECTED    Comment:        DRUG SCREEN FOR MEDICAL PURPOSES ONLY.  IF CONFIRMATION IS NEEDED FOR ANY PURPOSE, NOTIFY LAB WITHIN 5 DAYS.        LOWEST DETECTABLE LIMITS FOR URINE DRUG SCREEN Drug Class       Cutoff (ng/mL) Amphetamine      1000 Barbiturate      200 Benzodiazepine   338 Tricyclics       250 Opiates          300 Cocaine          300 THC              50   Lactic acid, plasma     Status: Abnormal   Collection Time: 10/10/14  9:41 AM  Result Value Ref Range   Lactic Acid, Venous 4.1 (H) 0.5 - 2.2 mmol/L  POC occult blood, ED RN will collect     Status: None   Collection Time: 10/10/14  9:41 AM  Result Value Ref Range   Fecal Occult Bld NEGATIVE NEGATIVE  pH, gastric fluid (gastroccult card)     Status: None   Collection Time: 10/10/14  9:49 AM  Result Value Ref Range   pH, Gastric 4   CBG monitoring, ED  Status: Abnormal   Collection Time: 10/10/14 10:22 AM  Result Value Ref Range   Glucose-Capillary 117 (H) 70 - 99 mg/dL  Magnesium     Status: Abnormal   Collection Time: 10/10/14  3:41 PM  Result  Value Ref Range   Magnesium 1.4 (L) 1.5 - 2.5 mg/dL  Basic metabolic panel     Status: Abnormal   Collection Time: 10/11/14  5:47 AM  Result Value Ref Range   Sodium 142 135 - 145 mmol/L    Comment: Please note change in reference range.   Potassium 3.5 3.5 - 5.1 mmol/L    Comment: DELTA CHECK NOTED Please note change in reference range.    Chloride 106 96 - 112 mEq/L   CO2 29 19 - 32 mmol/L   Glucose, Bld 78 70 - 99 mg/dL   BUN 7 6 - 23 mg/dL   Creatinine, Ser 0.47 (L) 0.50 - 1.10 mg/dL   Calcium 7.8 (L) 8.4 - 10.5 mg/dL   GFR calc non Af Amer >90 >90 mL/min   GFR calc Af Amer >90 >90 mL/min    Comment: (NOTE) The eGFR has been calculated using the CKD EPI equation. This calculation has not been validated in all clinical situations. eGFR's persistently <90 mL/min signify possible Chronic Kidney Disease.    Anion gap 7 5 - 15  CBC     Status: Abnormal   Collection Time: 10/11/14  5:47 AM  Result Value Ref Range   WBC 6.4 4.0 - 10.5 K/uL   RBC 3.40 (L) 3.87 - 5.11 MIL/uL   Hemoglobin 10.8 (L) 12.0 - 15.0 g/dL   HCT 32.5 (L) 36.0 - 46.0 %   MCV 95.6 78.0 - 100.0 fL   MCH 31.8 26.0 - 34.0 pg   MCHC 33.2 30.0 - 36.0 g/dL   RDW 14.5 11.5 - 15.5 %   Platelets 170 150 - 400 K/uL    ABGS No results for input(s): PHART, PO2ART, TCO2, HCO3 in the last 72 hours.  Invalid input(s): PCO2 CULTURES No results found for this or any previous visit (from the past 240 hour(s)). Studies/Results: Dg Chest 1 View  10/10/2014   CLINICAL DATA:  Altered mental status  EXAM: CHEST - 1 VIEW  COMPARISON:  08/11/2014  FINDINGS: Patient rotated to the right. Allowing for this positioning, the lungs are clear. Negative for pneumonia. Negative for heart failure or effusion.  Chronic healing fracture right humeral neck.  IMPRESSION: No active disease.   Electronically Signed   By: Franchot Gallo M.D.   On: 10/10/2014 07:30   Ct Head Wo Contrast  10/10/2014   CLINICAL DATA:  Drug overdose with  syncope  EXAM: CT HEAD WITHOUT CONTRAST  TECHNIQUE: Contiguous axial images were obtained from the base of the skull through the vertex without intravenous contrast.  COMPARISON:  October 08, 2010  FINDINGS: There is mild diffuse atrophy. Enlargement of both anterior horn lateral ventricles, larger on the left than on the right, is due to ex vacuo phenomenon. There is a calcified aneurysm remnant at the distal most aspect of the right internal carotid artery measuring 1.0 x 1.0 cm, stable trauma with an adjacent aneurysm clip which also appears stable. There is no intracranial mass, acute hemorrhage, extra-axial fluid collection, or midline shift. 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 These infarcts appear stable in size and distribution. There is small vessel disease in  the centra semiovale bilaterally, more on the left than on the right, stable. There is no new gray-white compartment lesion. No acute infarct apparent. Bony calvarium appears intact except for stable postoperative craniotomy changes on the right. The mastoid air cells are clear.  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 Calcified remains of this aneurysm are stable. There is no mass, hemorrhage, or acute appearing infarct. 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 There is mild atrophy with ex vacuo phenomenon causing enlargement of the frontal horns of each lateral ventricle, more on the left than on the right, stable. Appearance is essentially stable compared to prior study.   Electronically Signed   By: Lowella Grip M.D.   On: 10/10/2014 07:30    Medications:  Prior to Admission:  Prescriptions prior to admission  Medication Sig Dispense Refill Last Dose  . HYDROcodone-acetaminophen (NORCO) 7.5-325 MG per tablet Take 1 tablet by mouth every 4 (four) hours as needed  (pain(must last 30 days)).   unknown  . ipratropium-albuterol (DUONEB) 0.5-2.5 (3) MG/3ML SOLN Take 3 mLs by nebulization 4 (four) times daily.   unknown  . PHENObarbital (LUMINAL) 30 MG tablet Take 30 mg by mouth 2 (two) times daily.   unknown  . albuterol (PROVENTIL HFA;VENTOLIN HFA) 108 (90 BASE) MCG/ACT inhaler Inhale 2 puffs into the lungs every 6 (six) hours as needed. FOR SHORTNESS OF BREATH AND/OR WHEEZING   unknown  . amLODipine (NORVASC) 5 MG tablet Take 5 mg by mouth daily.   unknown  . azithromycin (ZITHROMAX) 250 MG tablet 1 tablet daily starting Monday at dinnertime. 4 each 0   . chlordiazePOXIDE (LIBRIUM) 25 MG capsule Take 25 mg by mouth 2 (two) times daily.   unknown  . lisinopril-hydrochlorothiazide (PRINZIDE,ZESTORETIC) 20-12.5 MG per tablet Take 1 tablet by mouth daily.   unknown  . omeprazole (PRILOSEC) 20 MG capsule Take 20 mg by mouth daily.   unknown  . phenytoin (DILANTIN) 100 MG ER capsule Take 100 mg by mouth 3 (three) times daily.   unknown  . predniSONE (DELTASONE) 50 MG tablet 1 tablet daily for 4 days, one half tablet daily for 4 days 6 tablet 0   . traMADol (ULTRAM) 50 MG tablet Take 1 tablet (50 mg total) by mouth every 6 (six) hours as needed for pain. 15 tablet 0 Past Week   Scheduled: . amLODipine  5 mg Oral Daily  . chlordiazePOXIDE  25 mg Oral BID  . folic acid  1 mg Oral Daily  . heparin  5,000 Units Subcutaneous 3 times per day  . lisinopril  20 mg Oral Daily   And  . hydrochlorothiazide  12.5 mg Oral Daily  . LORazepam  0-4 mg Intravenous Q6H   Followed by  . [START ON 10/12/2014] LORazepam  0-4 mg Intravenous Q12H  . multivitamin with minerals  1 tablet Oral Daily  . pantoprazole  40 mg Oral Daily  . PHENobarbital  32.4 mg Oral Daily  . phenytoin  100 mg Oral TID  . potassium chloride  20 mEq Oral BID  . sodium chloride  3 mL Intravenous Q12H  . thiamine  100 mg Oral Daily   Or  . thiamine  100 mg Intravenous Daily   Continuous: . sodium  chloride 100 mL/hr at 10/10/14 1655   EVO:[id number] **OR** acetaminophen, albuterol, ipratropium-albuterol, LORazepam **OR** LORazepam, ondansetron **OR** ondansetron (ZOFRAN) IV, senna-docusate  Assesment: She was admitted with alcohol intoxication and still  seems somewhat intoxicated. She is on alcohol withdrawal protocol. She has hypokalemia and her potassium is still somewhat low so she is going to get some oral potassium now. She has history of seizure disorder but no seizures here Principal Problem:   Alcohol intoxication Active Problems:   Sinus tachycardia   Hypokalemia   Seizure disorder    Plan: Continue treatment replace potassium    LOS: 1 day   Alexia Dinger L 10/11/2014, 10:15 AM

## 2014-10-11 NOTE — Progress Notes (Signed)
Pt c/o being hungry & requesting to eat solid food. No c/o nausea and/or vomiting at this time. Diet advanced to regular, order active to advance diet as tolerated.

## 2014-10-12 LAB — BASIC METABOLIC PANEL
Anion gap: 5 (ref 5–15)
BUN: <5 mg/dL — ABNORMAL LOW (ref 6–23)
CO2: 29 mmol/L (ref 19–32)
Calcium: 8.4 mg/dL (ref 8.4–10.5)
Chloride: 103 mEq/L (ref 96–112)
Creatinine, Ser: 0.5 mg/dL (ref 0.50–1.10)
GFR calc Af Amer: >90 mL/min (ref >90)
GFR calc non Af Amer: >90 mL/min (ref >90)
Glucose, Bld: 82 mg/dL (ref 70–99)
Potassium: 3.8 mmol/L (ref 3.5–5.1)
Sodium: 137 mmol/L (ref 135–145)

## 2014-10-12 NOTE — Progress Notes (Signed)
Subjective: Patient was admitted sue alcohol intoxications and she developed sign of alcohol withdrawal. She is doing better now.  Objective: Vital signs in last 24 hours: Temp:  [97.7 F (36.5 C)-99.3 F (37.4 C)] 98.4 F (36.9 C) (12/26 0604) Pulse Rate:  [70-91] 83 (12/26 0604) Resp:  [18] 18 (12/26 0604) BP: (114-146)/(55-94) 146/72 mmHg (12/26 0604) SpO2:  [100 %] 100 % (12/26 0604) Weight change:  Last BM Date: 10/11/14  Intake/Output from previous day: 12/25 0701 - 12/26 0700 In: 4175 [P.O.:1770; I.V.:2405] Out: 2250 [Urine:2250]  PHYSICAL EXAM General appearance: distracted, no distress and slowed mentation Resp: clear to auscultation bilaterally Cardio: S1, S2 normal GI: soft, non-tender; bowel sounds normal; no masses,  no organomegaly Extremities: extremities normal, atraumatic, no cyanosis or edema  Lab Results:  Results for orders placed or performed during the hospital encounter of 10/10/14 (from the past 48 hour(s))  Magnesium     Status: Abnormal   Collection Time: 10/10/14  3:41 PM  Result Value Ref Range   Magnesium 1.4 (L) 1.5 - 2.5 mg/dL  Basic metabolic panel     Status: Abnormal   Collection Time: 10/11/14  5:47 AM  Result Value Ref Range   Sodium 142 135 - 145 mmol/L    Comment: Please note change in reference range.   Potassium 3.5 3.5 - 5.1 mmol/L    Comment: DELTA CHECK NOTED Please note change in reference range.    Chloride 106 96 - 112 mEq/L   CO2 29 19 - 32 mmol/L   Glucose, Bld 78 70 - 99 mg/dL   BUN 7 6 - 23 mg/dL   Creatinine, Ser 0.47 (L) 0.50 - 1.10 mg/dL   Calcium 7.8 (L) 8.4 - 10.5 mg/dL   GFR calc non Af Amer >90 >90 mL/min   GFR calc Af Amer >90 >90 mL/min    Comment: (NOTE) The eGFR has been calculated using the CKD EPI equation. This calculation has not been validated in all clinical situations. eGFR's persistently <90 mL/min signify possible Chronic Kidney Disease.    Anion gap 7 5 - 15  CBC     Status: Abnormal   Collection Time: 10/11/14  5:47 AM  Result Value Ref Range   WBC 6.4 4.0 - 10.5 K/uL   RBC 3.40 (L) 3.87 - 5.11 MIL/uL   Hemoglobin 10.8 (L) 12.0 - 15.0 g/dL   HCT 32.5 (L) 36.0 - 46.0 %   MCV 95.6 78.0 - 100.0 fL   MCH 31.8 26.0 - 34.0 pg   MCHC 33.2 30.0 - 36.0 g/dL   RDW 14.5 11.5 - 15.5 %   Platelets 170 150 - 400 K/uL  Basic metabolic panel     Status: Abnormal   Collection Time: 10/12/14  6:27 AM  Result Value Ref Range   Sodium 137 135 - 145 mmol/L    Comment: Please note change in reference range.   Potassium 3.8 3.5 - 5.1 mmol/L    Comment: Please note change in reference range.   Chloride 103 96 - 112 mEq/L   CO2 29 19 - 32 mmol/L   Glucose, Bld 82 70 - 99 mg/dL   BUN <5 (L) 6 - 23 mg/dL   Creatinine, Ser 0.50 0.50 - 1.10 mg/dL   Calcium 8.4 8.4 - 10.5 mg/dL   GFR calc non Af Amer >90 >90 mL/min   GFR calc Af Amer >90 >90 mL/min    Comment: (NOTE) The eGFR has been calculated using the CKD EPI equation.  This calculation has not been validated in all clinical situations. eGFR's persistently <90 mL/min signify possible Chronic Kidney Disease.    Anion gap 5 5 - 15    ABGS No results for input(s): PHART, PO2ART, TCO2, HCO3 in the last 72 hours.  Invalid input(s): PCO2 CULTURES No results found for this or any previous visit (from the past 240 hour(s)). Studies/Results: No results found.  Medications: I have reviewed the patient's current medications.  Assesment:   Principal Problem:   Alcohol intoxication Active Problems:   Sinus tachycardia   Hypokalemia   Seizure disorder    Plan:  Medications reviewed Continue DT treatment according to protocol Continue regular treatment and supportive care.     LOS: 2 days   , 10/12/2014, 12:38 PM

## 2014-10-12 NOTE — Progress Notes (Signed)
Nutrition Brief Note  Patient identified on the Malnutrition Screening Tool (MST) Report  Pt presents with alcohol intoxication. Hx of HTN, chronic abdominal pain and chronic diarrhea.  Wt Readings from Last 15 Encounters:  10/10/14 136 lb (61.689 kg)  08/11/14 136 lb (61.689 kg)  09/05/12 142 lb 9.6 oz (64.683 kg)  01/06/12 148 lb (67.132 kg)    Body mass index is 25.71 kg/(m^2). Patient meets criteria for overweight based on current BMI. Weight loss over past 2 years is not significant for time frame.   Her appetite is good. Current diet order is regular, patient is consuming approximately 75% of meals at this time per nursing. Labs and medications reviewed.   No nutrition interventions warranted at this time. If nutrition issues arise, please consult RD.   Colman Cater MS,RD,CSG,LDN Office: [phone] Pager: [phone]

## 2014-10-13 MED ORDER — MAGNESIUM OXIDE 400 (241.3 MG) MG PO TABS
400.0000 mg | ORAL_TABLET | Freq: Two times a day (BID) | ORAL | Status: DC
  Administered 2014-10-13: 400 mg via ORAL
  Filled 2014-10-13: qty 1

## 2014-10-13 NOTE — Progress Notes (Signed)
Subjective: Patient is awake but confused and disoriented. No agitation or nervousness Objective: Vital signs in last 24 hours: Temp:  [97.6 F (36.4 C)-98.6 F (37 C)] 97.6 F (36.4 C) (12/27 0423) Pulse Rate:  [89-92] 89 (12/27 0423) Resp:  [18] 18 (12/27 0423) BP: (110-153)/(65-70) 136/65 mmHg (12/27 0423) SpO2:  [100 %] 100 % (12/27 0423) Weight change:  Last BM Date: 10/11/14  Intake/Output from previous day: 12/26 0701 - 12/27 0700 In: 2738.3 [P.O.:360; I.V.:2378.3] Out: 4250 [Urine:4250]  PHYSICAL EXAM General appearance: distracted, no distress and slowed mentation Resp: clear to auscultation bilaterally Cardio: S1, S2 normal GI: soft, non-tender; bowel sounds normal; no masses,  no organomegaly Extremities: extremities normal, atraumatic, no cyanosis or edema  Lab Results:  Results for orders placed or performed during the hospital encounter of 10/10/14 (from the past 48 hour(s))  Basic metabolic panel     Status: Abnormal   Collection Time: 10/12/14  6:27 AM  Result Value Ref Range   Sodium 137 135 - 145 mmol/L    Comment: Please note change in reference range.   Potassium 3.8 3.5 - 5.1 mmol/L    Comment: Please note change in reference range.   Chloride 103 96 - 112 mEq/L   CO2 29 19 - 32 mmol/L   Glucose, Bld 82 70 - 99 mg/dL   BUN <5 (L) 6 - 23 mg/dL   Creatinine, Ser 0.50 0.50 - 1.10 mg/dL   Calcium 8.4 8.4 - 10.5 mg/dL   GFR calc non Af Amer >90 >90 mL/min   GFR calc Af Amer >90 >90 mL/min    Comment: (NOTE) The eGFR has been calculated using the CKD EPI equation. This calculation has not been validated in all clinical situations. eGFR's persistently <90 mL/min signify possible Chronic Kidney Disease.    Anion gap 5 5 - 15    ABGS No results for input(s): PHART, PO2ART, TCO2, HCO3 in the last 72 hours.  Invalid input(s): PCO2 CULTURES No results found for this or any previous visit (from the past 240 hour(s)). Studies/Results: No results  found.  Medications: I have reviewed the patient's current medications.  Assesment:   Principal Problem:   Alcohol intoxication Active Problems:   Sinus tachycardia   Hypokalemia   Seizure disorder    Plan:  Medications reviewed Continue DT treatment according to protocol Continue regular treatment and supportive care.     LOS: 3 days   [name] 10/13/2014, 10:33 AM

## 2014-10-18 ENCOUNTER — Emergency Department (HOSPITAL_COMMUNITY): Payer: Medicare Other

## 2014-10-18 ENCOUNTER — Encounter (HOSPITAL_COMMUNITY)
Admission: EM | Disposition: A | Discharge status: Home / Self Care | Payer: Self-pay | Source: Home / Self Care | Attending: Internal Medicine

## 2014-10-18 ENCOUNTER — Inpatient Hospital Stay (HOSPITAL_COMMUNITY)
Admission: EM | Admit: 2014-10-18 | Discharge: 2014-10-20 | DRG: 392 | Disposition: A | Discharge status: Home / Self Care | Payer: Medicare Other | Attending: Internal Medicine | Admitting: Internal Medicine

## 2014-10-18 ENCOUNTER — Encounter (HOSPITAL_COMMUNITY): Payer: Self-pay

## 2014-10-18 DIAGNOSIS — F10129 Alcohol abuse with intoxication, unspecified: Secondary | ICD-10-CM | POA: Diagnosis present

## 2014-10-18 DIAGNOSIS — Y908 Blood alcohol level of 240 mg/100 ml or more: Secondary | ICD-10-CM | POA: Diagnosis present

## 2014-10-18 DIAGNOSIS — Z8719 Personal history of other diseases of the digestive system: Secondary | ICD-10-CM

## 2014-10-18 DIAGNOSIS — I10 Essential (primary) hypertension: Secondary | ICD-10-CM | POA: Diagnosis present

## 2014-10-18 DIAGNOSIS — Z79891 Long term (current) use of opiate analgesic: Secondary | ICD-10-CM

## 2014-10-18 DIAGNOSIS — R000 Tachycardia, unspecified: Secondary | ICD-10-CM | POA: Diagnosis present

## 2014-10-18 DIAGNOSIS — Z8679 Personal history of other diseases of the circulatory system: Secondary | ICD-10-CM

## 2014-10-18 DIAGNOSIS — K920 Hematemesis: Secondary | ICD-10-CM | POA: Diagnosis present

## 2014-10-18 DIAGNOSIS — K449 Diaphragmatic hernia without obstruction or gangrene: Secondary | ICD-10-CM | POA: Diagnosis present

## 2014-10-18 DIAGNOSIS — K2100 Gastro-esophageal reflux disease with esophagitis, without bleeding: Secondary | ICD-10-CM | POA: Insufficient documentation

## 2014-10-18 DIAGNOSIS — Z90710 Acquired absence of both cervix and uterus: Secondary | ICD-10-CM

## 2014-10-18 DIAGNOSIS — K219 Gastro-esophageal reflux disease without esophagitis: Secondary | ICD-10-CM | POA: Diagnosis present

## 2014-10-18 DIAGNOSIS — K922 Gastrointestinal hemorrhage, unspecified: Secondary | ICD-10-CM | POA: Diagnosis present

## 2014-10-18 DIAGNOSIS — Z8673 Personal history of transient ischemic attack (TIA), and cerebral infarction without residual deficits: Secondary | ICD-10-CM

## 2014-10-18 DIAGNOSIS — Z79899 Other long term (current) drug therapy: Secondary | ICD-10-CM | POA: Diagnosis not present

## 2014-10-18 DIAGNOSIS — F10929 Alcohol use, unspecified with intoxication, unspecified: Secondary | ICD-10-CM | POA: Diagnosis present

## 2014-10-18 DIAGNOSIS — Z7952 Long term (current) use of systemic steroids: Secondary | ICD-10-CM | POA: Diagnosis not present

## 2014-10-18 DIAGNOSIS — R05 Cough: Secondary | ICD-10-CM

## 2014-10-18 DIAGNOSIS — K208 Other esophagitis: Secondary | ICD-10-CM | POA: Diagnosis present

## 2014-10-18 DIAGNOSIS — R059 Cough, unspecified: Secondary | ICD-10-CM

## 2014-10-18 DIAGNOSIS — Z87891 Personal history of nicotine dependence: Secondary | ICD-10-CM

## 2014-10-18 DIAGNOSIS — E860 Dehydration: Secondary | ICD-10-CM | POA: Diagnosis present

## 2014-10-18 DIAGNOSIS — Z8711 Personal history of peptic ulcer disease: Secondary | ICD-10-CM | POA: Diagnosis not present

## 2014-10-18 DIAGNOSIS — K529 Noninfective gastroenteritis and colitis, unspecified: Secondary | ICD-10-CM | POA: Diagnosis present

## 2014-10-18 DIAGNOSIS — K7010 Alcoholic hepatitis without ascites: Secondary | ICD-10-CM | POA: Diagnosis present

## 2014-10-18 DIAGNOSIS — R110 Nausea: Secondary | ICD-10-CM

## 2014-10-18 HISTORY — PX: ESOPHAGOGASTRODUODENOSCOPY: SHX5428

## 2014-10-18 LAB — CBC WITH DIFFERENTIAL/PLATELET
Basophils Absolute: 0 10*3/uL (ref 0.0–0.1)
Basophils Relative: 1 % (ref 0–1)
Eosinophils Absolute: 0 10*3/uL (ref 0.0–0.7)
Eosinophils Relative: 1 % (ref 0–5)
HCT: 40.6 % (ref 36.0–46.0)
Hemoglobin: 13.8 g/dL (ref 12.0–15.0)
Lymphocytes Relative: 20 % (ref 12–46)
Lymphs Abs: 1.6 10*3/uL (ref 0.7–4.0)
MCH: 31.8 pg (ref 26.0–34.0)
MCHC: 34 g/dL (ref 30.0–36.0)
MCV: 93.5 fL (ref 78.0–100.0)
Monocytes Absolute: 0.8 10*3/uL (ref 0.1–1.0)
Monocytes Relative: 10 % (ref 3–12)
Neutro Abs: 5.5 10*3/uL (ref 1.7–7.7)
Neutrophils Relative %: 68 % (ref 43–77)
Platelets: 201 10*3/uL (ref 150–400)
RBC: 4.34 MIL/uL (ref 3.87–5.11)
RDW: 14 % (ref 11.5–15.5)
WBC: 8 10*3/uL (ref 4.0–10.5)

## 2014-10-18 LAB — PROTIME-INR
INR: 1.09 (ref 0.00–1.49)
Prothrombin Time: 14.2 seconds (ref 11.6–15.2)

## 2014-10-18 LAB — COMPREHENSIVE METABOLIC PANEL
ALT: 50 U/L — ABNORMAL HIGH (ref 0–35)
AST: 136 U/L — ABNORMAL HIGH (ref 0–37)
Albumin: 3.9 g/dL (ref 3.5–5.2)
Alkaline Phosphatase: 148 U/L — ABNORMAL HIGH (ref 39–117)
Anion gap: 14 (ref 5–15)
BUN: <5 mg/dL — ABNORMAL LOW (ref 6–23)
CO2: 25 mmol/L (ref 19–32)
Calcium: 9.1 mg/dL (ref 8.4–10.5)
Chloride: 101 mEq/L (ref 96–112)
Creatinine, Ser: 0.42 mg/dL — ABNORMAL LOW (ref 0.50–1.10)
GFR calc Af Amer: >90 mL/min (ref >90)
GFR calc non Af Amer: >90 mL/min (ref >90)
Glucose, Bld: 124 mg/dL — ABNORMAL HIGH (ref 70–99)
Potassium: 3.4 mmol/L — ABNORMAL LOW (ref 3.5–5.1)
Sodium: 140 mmol/L (ref 135–145)
Total Bilirubin: 0.5 mg/dL (ref 0.3–1.2)
Total Protein: 9.4 g/dL — ABNORMAL HIGH (ref 6.0–8.3)

## 2014-10-18 LAB — CBC
HCT: 30.5 % — ABNORMAL LOW (ref 36.0–46.0)
HCT: 29.7 % — ABNORMAL LOW (ref 36.0–46.0)
Hemoglobin: 10.2 g/dL — ABNORMAL LOW (ref 12.0–15.0)
Hemoglobin: 9.9 g/dL — ABNORMAL LOW (ref 12.0–15.0)
MCH: 31.4 pg (ref 26.0–34.0)
MCH: 31.5 pg (ref 26.0–34.0)
MCHC: 33.4 g/dL (ref 30.0–36.0)
MCHC: 33.3 g/dL (ref 30.0–36.0)
MCV: 93.8 fL (ref 78.0–100.0)
MCV: 94.6 fL (ref 78.0–100.0)
Platelets: 152 10*3/uL (ref 150–400)
Platelets: 145 10*3/uL — ABNORMAL LOW (ref 150–400)
RBC: 3.25 MIL/uL — ABNORMAL LOW (ref 3.87–5.11)
RBC: 3.14 MIL/uL — ABNORMAL LOW (ref 3.87–5.11)
RDW: 13.9 % (ref 11.5–15.5)
RDW: 14.1 % (ref 11.5–15.5)
WBC: 5.6 10*3/uL (ref 4.0–10.5)
WBC: 4.4 10*3/uL (ref 4.0–10.5)

## 2014-10-18 LAB — OCCULT BLOOD GASTRIC / DUODENUM (SPECIMEN CUP)
Occult Blood, Gastric: POSITIVE — AB
pH, Gastric: 1

## 2014-10-18 LAB — MRSA PCR SCREENING: MRSA by PCR: NEGATIVE

## 2014-10-18 LAB — PH, GASTRIC FLUID (GASTROCCULT CARD): pH, Gastric: 1

## 2014-10-18 LAB — ETHANOL: Alcohol, Ethyl (B): 316 mg/dL — ABNORMAL HIGH (ref 0–9)

## 2014-10-18 SURGERY — EGD (ESOPHAGOGASTRODUODENOSCOPY)
Anesthesia: Moderate Sedation

## 2014-10-18 MED ORDER — LORAZEPAM 1 MG PO TABS
1.0000 mg | ORAL_TABLET | Freq: Four times a day (QID) | ORAL | Status: DC | PRN
  Administered 2014-10-18: 1 mg via ORAL
  Filled 2014-10-18: qty 2

## 2014-10-18 MED ORDER — SODIUM CHLORIDE 0.9 % IV SOLN
INTRAVENOUS | Status: DC
  Administered 2014-10-18: 10:00:00 via INTRAVENOUS

## 2014-10-18 MED ORDER — SUCRALFATE 1 GM/10ML PO SUSP
1.0000 g | Freq: Three times a day (TID) | ORAL | Status: DC
  Administered 2014-10-18 – 2014-10-20 (×8): 1 g via ORAL
  Filled 2014-10-18 (×8): qty 10

## 2014-10-18 MED ORDER — ADULT MULTIVITAMIN W/MINERALS CH
1.0000 | ORAL_TABLET | Freq: Every day | ORAL | Status: DC
  Administered 2014-10-18 – 2014-10-20 (×3): 1 via ORAL
  Filled 2014-10-18 (×3): qty 1

## 2014-10-18 MED ORDER — PANTOPRAZOLE SODIUM 40 MG IV SOLR
40.0000 mg | Freq: Once | INTRAVENOUS | Status: AC
  Administered 2014-10-18: 40 mg via INTRAVENOUS
  Filled 2014-10-18: qty 40

## 2014-10-18 MED ORDER — SODIUM CHLORIDE 0.9 % IJ SOLN
3.0000 mL | Freq: Two times a day (BID) | INTRAMUSCULAR | Status: DC
  Administered 2014-10-18 – 2014-10-20 (×5): 3 mL via INTRAVENOUS

## 2014-10-18 MED ORDER — LOPERAMIDE HCL 2 MG PO CAPS: 2.0000 mg | ORAL_CAPSULE | ORAL | Status: DC | PRN

## 2014-10-18 MED ORDER — THIAMINE HCL 100 MG/ML IJ SOLN
Freq: Once | INTRAVENOUS | Status: AC
  Administered 2014-10-18: 12:00:00 via INTRAVENOUS
  Filled 2014-10-18: qty 1000

## 2014-10-18 MED ORDER — MEPERIDINE HCL 100 MG/ML IJ SOLN
INTRAMUSCULAR | Status: AC
  Filled 2014-10-18: qty 2

## 2014-10-18 MED ORDER — MEPERIDINE HCL 100 MG/ML IJ SOLN
INTRAMUSCULAR | Status: DC | PRN
  Administered 2014-10-18: 25 mg via INTRAVENOUS

## 2014-10-18 MED ORDER — HYDROXYZINE HCL 25 MG PO TABS: 25.0000 mg | ORAL_TABLET | Freq: Four times a day (QID) | ORAL | Status: DC | PRN

## 2014-10-18 MED ORDER — SODIUM CHLORIDE 0.9 % IV BOLUS (SEPSIS)
1000.0000 mL | Freq: Once | INTRAVENOUS | Status: AC
  Administered 2014-10-18: 1000 mL via INTRAVENOUS

## 2014-10-18 MED ORDER — MIDAZOLAM HCL 5 MG/5ML IJ SOLN
INTRAMUSCULAR | Status: AC
  Filled 2014-10-18: qty 10

## 2014-10-18 MED ORDER — MIDAZOLAM HCL 5 MG/5ML IJ SOLN
INTRAMUSCULAR | Status: DC | PRN
  Administered 2014-10-18: 1 mg via INTRAVENOUS

## 2014-10-18 MED ORDER — SODIUM CHLORIDE 0.9 % IV BOLUS (SEPSIS)
500.0000 mL | Freq: Once | INTRAVENOUS | Status: AC
  Administered 2014-10-18: 500 mL via INTRAVENOUS

## 2014-10-18 MED ORDER — ONDANSETRON HCL 4 MG/2ML IJ SOLN
INTRAMUSCULAR | Status: AC
  Filled 2014-10-18: qty 2

## 2014-10-18 MED ORDER — CHLORDIAZEPOXIDE HCL 25 MG PO CAPS
25.0000 mg | ORAL_CAPSULE | Freq: Once | ORAL | Status: AC
  Administered 2014-10-18: 25 mg via ORAL
  Filled 2014-10-18: qty 1

## 2014-10-18 MED ORDER — LIDOCAINE VISCOUS 2 % MT SOLN
OROMUCOSAL | Status: DC | PRN
  Administered 2014-10-18: 1 via OROMUCOSAL

## 2014-10-18 MED ORDER — ONDANSETRON 4 MG PO TBDP
4.0000 mg | ORAL_TABLET | Freq: Four times a day (QID) | ORAL | Status: DC | PRN
  Filled 2014-10-18: qty 1

## 2014-10-18 MED ORDER — THIAMINE HCL 100 MG/ML IJ SOLN
100.0000 mg | Freq: Once | INTRAMUSCULAR | Status: AC
  Administered 2014-10-18: 100 mg via INTRAMUSCULAR
  Filled 2014-10-18: qty 2

## 2014-10-18 MED ORDER — SODIUM CHLORIDE 0.9 % IV SOLN: INTRAVENOUS | Status: DC

## 2014-10-18 MED ORDER — PANTOPRAZOLE SODIUM 40 MG IV SOLR
40.0000 mg | Freq: Two times a day (BID) | INTRAVENOUS | Status: DC
  Administered 2014-10-18 – 2014-10-20 (×4): 40 mg via INTRAVENOUS
  Filled 2014-10-18 (×4): qty 40

## 2014-10-18 MED ORDER — VITAMIN B-1 100 MG PO TABS
100.0000 mg | ORAL_TABLET | Freq: Every day | ORAL | Status: DC
  Administered 2014-10-19 – 2014-10-20 (×2): 100 mg via ORAL
  Filled 2014-10-18 (×2): qty 1

## 2014-10-18 MED ORDER — LIDOCAINE VISCOUS 2 % MT SOLN
OROMUCOSAL | Status: AC
  Filled 2014-10-18: qty 15

## 2014-10-18 MED ORDER — STERILE WATER FOR IRRIGATION IR SOLN
Status: DC | PRN
  Administered 2014-10-18: 15:00:00

## 2014-10-18 NOTE — ED Notes (Signed)
In with edp . Suction canister has approx 278ml of coffee ground emesis. Pt states her friend told her that she vomited blood. Pt states she does not know how many times. Pt denies pain or any other symptoms at this time. Pt states she just is sleepy.

## 2014-10-18 NOTE — Progress Notes (Signed)
1510 Arrived in patient's room. Obtained consent from patient. Verified patient and procedure. Patient verbalized understanding.Started sedation at 1546. Left patient at 1620. VSS. No complications noted.

## 2014-10-18 NOTE — ED Notes (Signed)
Patient via [facility]. [facility] states they responded to alcohol intoxication. EMS states patient was asleep on the couch but became more aroused on the ride to the hospital. Upon arrival to the ED patient is a poor historian of tonight's events and unable to voice reason for ER visit. Patient is awake and this time, but slow to respond with slurred garbled speech. Patient does answer correctly her name and birthday

## 2014-10-18 NOTE — Consult Note (Signed)
Referring Provider: Dr. Verlon Au Primary Care Physician:  [name], MD Primary Gastroenterologist:  Dr. [name]  Reason for Consultation: Hematemesis  HPI: 59 year old lady with long-standing heavy alcohol abuse brought to the ED by EMS yesterday. Acutely intoxicated. Apparent heavy consumption of beer and liquor over the holidays. Seen in the ED and admitted. Apparently, had about 200 mL of coffee-ground emesis. No fresh blood in emesis. Gastroccult positive. No melena or hematochezia. Has been entirely hemodynamically stable. Receiving Ativan withdrawal protocol. Hemoglobin overnight has dropped to 13-10 with hydration (3L). Admitting blood alcohol level 318. EGD back in 2013 demonstrated esophageal stricture. No varices noted. Colonoscopy-adenoma removed at that time as well. Her platelet count, INR and albumin remain normal which suggest a lesser likelihood of advanced liver disease with portal hypertension.  No prior diagnosis of cirrhosis.       Past Medical History  Diagnosis Date  . Stroke     age 64, required brain surgery  . Seizures     since stroke, no recent seizures  . HTN (hypertension)   . PUD (peptic ulcer disease)     remote  . Shortness of breath     with exertion  . Contracture of muscle of hand     right  . Alcohol abuse   . Chronic abdominal pain   . Chronic diarrhea     Past Surgical History  Procedure Laterality Date  . Abdominal hysterectomy  2004    complete  . Right tib-fib fracture  2008  . Brain surgery      age 56, stroke  . Colonoscopy with propofol  10/03/2012    Procedure: COLONOSCOPY WITH PROPOFOL;  Surgeon: Danie Binder, MD;  Location: [facility];  Service: Endoscopy;  Laterality: N/A;  1230 in cecum, total withdrawel time 18 minutes, done at 1248  . Esophagogastroduodenoscopy (egd) with propofol  10/03/2012    Procedure: ESOPHAGOGASTRODUODENOSCOPY (EGD) WITH PROPOFOL;  Surgeon: Danie Binder, MD;  Location: [facility];  Service:  Endoscopy;  Laterality: N/A;  started at 1254  . Savory dilation  10/03/2012    Procedure: SAVORY DILATION;  Surgeon: Danie Binder, MD;  Location: [facility];  Service: Endoscopy;  Laterality: N/A;  started at 1303,  dilated 12.8-16mm    Prior to Admission medications   Medication Sig Start Date End Date Taking? Authorizing Provider  albuterol (PROVENTIL HFA;VENTOLIN HFA) 108 (90 BASE) MCG/ACT inhaler Inhale 2 puffs into the lungs every 6 (six) hours as needed. FOR SHORTNESS OF BREATH AND/OR WHEEZING    Historical Provider, MD  amLODipine (NORVASC) 5 MG tablet Take 5 mg by mouth daily.    Historical Provider, MD  azithromycin (ZITHROMAX) 250 MG tablet 1 tablet daily starting Monday at dinnertime. 08/11/14   [name], MD  chlordiazePOXIDE (LIBRIUM) 25 MG capsule Take 25 mg by mouth 2 (two) times daily. 10/03/12   Danie Binder, MD  HYDROcodone-acetaminophen (NORCO) 7.5-325 MG per tablet Take 1 tablet by mouth every 4 (four) hours as needed (pain(must last 30 days)).    Historical Provider, MD  ipratropium-albuterol (DUONEB) 0.5-2.5 (3) MG/3ML SOLN Take 3 mLs by nebulization 4 (four) times daily.    Historical Provider, MD  lisinopril-hydrochlorothiazide (PRINZIDE,ZESTORETIC) 20-12.5 MG per tablet Take 1 tablet by mouth daily.    Historical Provider, MD  omeprazole (PRILOSEC) 20 MG capsule Take 20 mg by mouth daily.    Historical Provider, MD  PHENObarbital (LUMINAL) 30 MG tablet Take 30 mg by mouth 2 (two) times daily.  Historical Provider, MD  phenytoin (DILANTIN) 100 MG ER capsule Take 100 mg by mouth 3 (three) times daily.    Historical Provider, MD  predniSONE (DELTASONE) 50 MG tablet 1 tablet daily for 4 days, one half tablet daily for 4 days 08/11/14   [name], MD  traMADol (ULTRAM) 50 MG tablet Take 1 tablet (50 mg total) by mouth every 6 (six) hours as needed for pain. 08/17/13   Orpah Greek, MD    Current Facility-Administered Medications  Medication Dose Route  Frequency Provider Last Rate Last Dose  . hydrOXYzine (ATARAX/VISTARIL) tablet 25 mg  25 mg Oral Q6H PRN Nita Sells, MD      . loperamide (IMODIUM) capsule 2-4 mg  2-4 mg Oral PRN Nita Sells, MD      . LORazepam (ATIVAN) tablet 1 mg  1 mg Oral Q6H PRN Nita Sells, MD   1 mg at 10/18/14 1242  . multivitamin with minerals tablet 1 tablet  1 tablet Oral Daily Nita Sells, MD   1 tablet at 10/18/14 1023  . ondansetron (ZOFRAN-ODT) disintegrating tablet 4 mg  4 mg Oral Q6H PRN Nita Sells, MD      . sodium chloride 0.9 % injection 3 mL  3 mL Intravenous Q12H Nita Sells, MD   3 mL at 10/18/14 1024  . [START ON 10/19/2014] thiamine (VITAMIN B-1) tablet 100 mg  100 mg Oral Daily Nita Sells, MD        Allergies as of 10/18/2014 - Review Complete 10/18/2014  Allergen Reaction Noted  . Other  08/17/2013    Family History  Problem Relation Age of Onset  . Colon cancer Neg Hx   . Liver disease Sister     etoh    History   Social History  . Marital Status: Single    Spouse Name: N/A    Number of Children: 3  . Years of Education: N/A   Occupational History  . disability    Social History Main Topics  . Smoking status: Former Smoker -- 0.04 packs/day for 9 years    Types: Cigarettes    Quit date: 07/28/2014  . Smokeless tobacco: Never Used  . Alcohol Use: Yes     Comment: last drank on Monday  . Drug Use: No  . Sexual Activity: Not on file   Other Topics Concern  . Not on file   Social History Narrative    Review of Systems:   As in history of present illness  Physical Exam: Vital signs in last 24 hours: Temp:  [97.4 F (36.3 C)-98.2 F (36.8 C)] 98.2 F (36.8 C) (01/01 0931) Pulse Rate:  [117-128] 125 (01/01 1300) Resp:  [24-47] 47 (01/01 1300) BP: (122-176)/(53-107) 160/83 mmHg (01/01 1300) SpO2:  [92 %-100 %] 94 % (01/01 1300) Weight:  [125 lb (56.7 kg)-131 lb 9.8 oz (59.7 kg)] 131 lb 9.8 oz (59.7 kg) (01/01  0931) Last BM Date: 10/17/14 General:   Alert,  Calm, pleasant conversant and cooperative in NAD Head:  Normocephalic and atraumatic. Eyes:  Sclera clear, no icterus.   Conjunctiva pink. Ears:  Normal auditory acuity. Nose:  No deformity, discharge,  or lesions. Mouth:  No deformity or lesions, dentition normal. Neck:  Supple; no masses or thyromegaly. Lungs:  Clear throughout to auscultation.   No wheezes, crackles, or rhonchi. No acute distress. Heart:  Regular rate and rhythm; no murmurs, clicks, rubs,  or gallops. Abdomen:  Soft, nontender and nondistended. No masses, hepatosplenomegaly or hernias noted. Normal  bowel sounds, without guarding, and without rebound.   Extremities:  Without clubbing or edema. Neurologic:  Alert and  oriented x4;  grossly normal neurologically.  Intake/Output from previous day:   Intake/Output this shift: Total I/O In: -  Out: 300 [Urine:300]  Lab Results:  Recent Labs  10/18/14 0200 10/18/14 1114  WBC 8.0 5.6  HGB 13.8 10.2*  HCT 40.6 30.5*  PLT 201 152   BMET  Recent Labs  10/18/14 0200  NA 140  K 3.4*  CL 101  CO2 25  GLUCOSE 124*  BUN <5*  CREATININE 0.42*  CALCIUM 9.1   LFT  Recent Labs  10/18/14 0200  PROT 9.4*  ALBUMIN 3.9  AST 136*  ALT 50*  ALKPHOS 148*  BILITOT 0.5   Impression:  59 year old lady with long-standing alcohol abuse admitted to the hospital acute intoxicated with coffee ground emesis and probable alcoholic hepatitis (discriminate function 6).  She remains hemodynamically stable. 3 g drop in hemoglobin with 3 L of IV fluids overnight.  I suspect coffee ground emesis related to GERD/plus minus Mallory-Weiss tear. Peptic ulcer disease remains in the differential as does alcoholic gastropathy. Given clinical and laboratory findings today, I doubt she has esophageal varices however, that entity certainly remains in the differential.  Recommendations:  Agree with IV PPI therapy empirically. Patient  needs a diagnostic EGD. Previously done under propofol. However, given her current status, I feel that we could complete an EGD with conscious sedation at the bedside.The risks, benefits, limitations, alternatives and imponderables have been reviewed with the patient. Potential for , biopsy, etc. have also been reviewed.  Questions have been answered. All parties agreeable.     Notice:  This dictation was prepared with Dragon dictation along with smaller phrase technology. Any transcriptional errors that result from this process are unintentional and may not be corrected upon review.

## 2014-10-18 NOTE — Progress Notes (Signed)
The pt's sister Armando Reichert is requesting a phone call tomorrow related to the patient's condition. The patient consented to allow Ms. Booker to be informed about her healthcare. [name]'s number is [phone].

## 2014-10-18 NOTE — Op Note (Signed)
[place], [id number]   ENDOSCOPY PROCEDURE REPORT  PATIENT: Lauren, [name]  MR#: [id number] BIRTHDATE: 1956-04-08 , 50  yrs. old GENDER: female ENDOSCOPIST: R.  Garfield Cornea, MD FACP FACG REFERRED BY:  Conni Slipper, M.D. PROCEDURE DATE:  2014/11/08 PROCEDURE:  EGD, diagnostic INDICATIONS:  hematemesis. MEDICATIONS: Versed 1 mg IV and Demerol 25 mg IV.  Xylocaine gel orally. ASA CLASS:      Class III  CONSENT: The risks, benefits, limitations, alternatives and imponderables have been discussed.  The potential for biopsy, esophogeal dilation, etc. have also been reviewed.  Questions have been answered.  All parties agreeable.  Please see the history and physical in the medical record for more information.  DESCRIPTION OF PROCEDURE: After the risks benefits and alternatives of the procedure were thoroughly explained, informed consent was obtained.  The    endoscope was introduced through the mouth and advanced to the second portion of the duodenum , limited by Without limitations. The instrument was slowly withdrawn as the mucosa was fully examined.    Marked inflammatory changes of the distal one half of the tubular esophagus.  Patient had four-quadrant erosions with overlying exudate coming up to 10 cm into distal tubular esophagus.  There was circumferential friability, erosion and excoriation at the EG junction.  No tumor.  I did not see any evidence of Barrett's esophagus.  No varices.  Stomach empty.  Normal gastric mucosa.  2 cm hiatal hernia.  Patent pylorus.  Normal first and second portion of the duodenum.  Retroflexed views revealed as previously described and Retroflexed views revealed a hiatal hernia.     The scope was then withdrawn from the patient and the procedure completed.  COMPLICATIONS: There were no immediate complications.  ENDOSCOPIC IMPRESSION: Severe exudative/erosive reflux esophagitis. Markedly  inflamed esophageal mucosa involved in this process?"likely source of hematemesis. Hiatal hernia.  RECOMMENDATIONS: Clear liquid diet. Twice a day PPI. Carafate suspension 1 g 4 times a day. Of course, if alcohol cessation could be accomplished, this would be of great benefit to this lady's health overall.  REPEAT EXAM:  eSigned:  R. Garfield Cornea, MD [facility] 2014-11-08 4:08 PM    CC:  CPT CODES: ICD CODES:  The ICD and CPT codes recommended by this software are interpretations from the data that the clinical staff has captured with the software.  The verification of the translation of this report to the ICD and CPT codes and modifiers is the sole responsibility of the health care institution and practicing physician where this report was generated.  [facility] [place]. will not be held responsible for the validity of the ICD and CPT codes included on this report.  AMA assumes no liability for data contained or not contained herein. CPT is a Designer, television/film set of the [employer].  PATIENT NAME:  Lauren, [name] MR#: [id number]

## 2014-10-18 NOTE — ED Provider Notes (Signed)
CSN: [id number]     Arrival date & time 10/18/14  0142 History   First MD Initiated Contact with Patient 10/18/14 0410     Chief Complaint  Patient presents with  . Alcohol Intoxication   LEVEL 5 CAVEAT DUE TO ALCOHOL INTOXICATION  Patient is a 59 y.o. female presenting with intoxication. The history is provided by the patient and the EMS personnel. The history is limited by the condition of the patient.  Alcohol Intoxication This is a recurrent problem. The problem occurs constantly. Pertinent negatives include no abdominal pain and no headaches. Nothing aggravates the symptoms. Nothing relieves the symptoms.  Patient presents for alcohol intoxication It is unclear when and how much alcohol she consumed Per EMS, pt was found on couch asleep.   Pt is unsure who called EMS She does admit to ETOH use She denies any other complaints No other history is known at this time   Past Medical History  Diagnosis Date  . Stroke     age 53, required brain surgery  . Seizures     since stroke, no recent seizures  . HTN (hypertension)   . PUD (peptic ulcer disease)     remote  . Shortness of breath     with exertion  . Contracture of muscle of hand     right  . Alcohol abuse   . Chronic abdominal pain   . Chronic diarrhea    Past Surgical History  Procedure Laterality Date  . Abdominal hysterectomy  2004    complete  . Right tib-fib fracture  2008  . Brain surgery      age 49, stroke  . Colonoscopy with propofol  10/03/2012    Procedure: COLONOSCOPY WITH PROPOFOL;  Surgeon: Danie Binder, MD;  Location: [facility];  Service: Endoscopy;  Laterality: N/A;  1230 in cecum, total withdrawel time 18 minutes, done at 1248  . Esophagogastroduodenoscopy (egd) with propofol  10/03/2012    Procedure: ESOPHAGOGASTRODUODENOSCOPY (EGD) WITH PROPOFOL;  Surgeon: Danie Binder, MD;  Location: [facility];  Service: Endoscopy;  Laterality: N/A;  started at 1254  . Savory dilation  10/03/2012    Procedure:  SAVORY DILATION;  Surgeon: Danie Binder, MD;  Location: [facility];  Service: Endoscopy;  Laterality: N/A;  started at 1303,  dilated 12.8-16mm   Family History  Problem Relation Age of Onset  . Colon cancer Neg Hx   . Liver disease Sister     etoh   History  Substance Use Topics  . Smoking status: Former Smoker -- 0.04 packs/day for 9 years    Types: Cigarettes    Quit date: 07/28/2014  . Smokeless tobacco: Never Used  . Alcohol Use: Yes     Comment: last drank on Monday   OB History    Gravida Para Term Preterm AB TAB SAB Ectopic Multiple Living   4 3 3  1  1   3      Review of Systems  Unable to perform ROS: Mental status change  Gastrointestinal: Negative for abdominal pain.  Neurological: Negative for headaches.      Allergies  Other  Home Medications   Prior to Admission medications   Medication Sig Start Date End Date Taking? Authorizing Provider  albuterol (PROVENTIL HFA;VENTOLIN HFA) 108 (90 BASE) MCG/ACT inhaler Inhale 2 puffs into the lungs every 6 (six) hours as needed. FOR SHORTNESS OF BREATH AND/OR WHEEZING    Historical Provider, MD  amLODipine (NORVASC) 5 MG tablet Take 5 mg  by mouth daily.    Historical Provider, MD  azithromycin (ZITHROMAX) 250 MG tablet 1 tablet daily starting Monday at dinnertime. 08/11/14   [name], MD  chlordiazePOXIDE (LIBRIUM) 25 MG capsule Take 25 mg by mouth 2 (two) times daily. 10/03/12   Danie Binder, MD  HYDROcodone-acetaminophen (NORCO) 7.5-325 MG per tablet Take 1 tablet by mouth every 4 (four) hours as needed (pain(must last 30 days)).    Historical Provider, MD  ipratropium-albuterol (DUONEB) 0.5-2.5 (3) MG/3ML SOLN Take 3 mLs by nebulization 4 (four) times daily.    Historical Provider, MD  lisinopril-hydrochlorothiazide (PRINZIDE,ZESTORETIC) 20-12.5 MG per tablet Take 1 tablet by mouth daily.    Historical Provider, MD  omeprazole (PRILOSEC) 20 MG capsule Take 20 mg by mouth daily.    Historical Provider, MD   PHENObarbital (LUMINAL) 30 MG tablet Take 30 mg by mouth 2 (two) times daily.    Historical Provider, MD  phenytoin (DILANTIN) 100 MG ER capsule Take 100 mg by mouth 3 (three) times daily.    Historical Provider, MD  predniSONE (DELTASONE) 50 MG tablet 1 tablet daily for 4 days, one half tablet daily for 4 days 08/11/14   [name], MD  traMADol (ULTRAM) 50 MG tablet Take 1 tablet (50 mg total) by mouth every 6 (six) hours as needed for pain. 08/17/13   Orpah Greek, MD   BP 176/92 mmHg  Pulse 120  Temp(Src) 97.4 F (36.3 C) (Oral)  Resp 24  Ht 5\' 1"  (1.549 m)  Wt 125 lb (56.7 kg)  BMI 23.63 kg/m2  SpO2 100% Physical Exam CONSTITUTIONAL: disheveled, smells of ETOH HEAD: Normocephalic/atraumatic EYES: EOMI/PERRL ENMT: Mucous membranes dry NECK: supple no meningeal signs SPINE/BACK:entire spine nontender CV: S1/S2 noted, no murmurs/rubs/gallops noted LUNGS: coarse BS noted bilaterally ABDOMEN: soft, nontender, no rebound or guarding, bowel sounds noted throughout abdomen NEURO: Pt is awake/alert, right UE contracture (baseline). She follows commands and is in no distress.  She moves all extremities.   EXTREMITIES: pulses normal/equal, full ROM SKIN: warm, color normal PSYCH: pt is intoxicated unable to fully assess  ED Course  Procedures   CRITICAL CARE Performed by: Sharyon Cable Total critical care time: 33 Critical care time was exclusive of separately billable procedures and treating other patients. Critical care was necessary to treat or prevent imminent or life-threatening deterioration. Critical care was time spent personally by me on the following activities: development of treatment plan with patient and/or surrogate as well as nursing, discussions with consultants, evaluation of patient's response to treatment, examination of patient, obtaining history from patient or surrogate, ordering and performing treatments and interventions, ordering and review of  laboratory studies, ordering and review of radiographic studies, pulse oximetry and re-evaluation of patient's condition. Patient tachycardic despite IV fluid with upper GI bleed (hematemesis)  5:30 AM Pt presents for ETOH intox She is tachycardic Review of chart reveals she was in hospital up until 12/27 and had tachycardia at that time She has been given IV fluids Will follow closely 8:11 AM Pt still tachycardic despite IV fluids She denies any pain However she did vomit about 100-218ml of dark fluid.   Hemoccult negative (nurse chaperone present) Pt admitted recently for similar episode Will admit to monitoring, rehydration and GI consultation Last EGD in 2013 revealed gastritis Will admit to [place] PANEL - Abnormal; Notable for the following:    Potassium 3.4 (*)    Glucose, Bld 124 (*)    BUN <5 (*)  Creatinine, Ser 0.42 (*)    Total Protein 9.4 (*)    AST 136 (*)    ALT 50 (*)    Alkaline Phosphatase 148 (*)    All other components within normal limits  ETHANOL - Abnormal; Notable for the following:    Alcohol, Ethyl (B) 316 (*)    All other components within normal limits  CBC WITH DIFFERENTIAL  PH, GASTRIC FLUID (GASTROCCULT CARD)  OCCULT BLOOD GASTRIC / DUODENUM (SPECIMEN CUP)  POC OCCULT BLOOD, ED    Imaging Review Dg Chest Portable 1 View  10/18/2014   CLINICAL DATA:  Cough, alcohol intoxication.  EXAM: PORTABLE CHEST - 1 VIEW  COMPARISON:  Chest radiograph October 10, 2014  FINDINGS: Cardiomediastinal silhouette is unremarkable. The lungs are clear without pleural effusions or focal consolidations. Trachea projects midline and there is no pneumothorax. Healing RIGHT humeral head fracture. Osteopenia.  IMPRESSION: No acute cardiopulmonary process; stable chest radiograph from October 10, 2014   Electronically Signed   By: Elon Alas   On: 10/18/2014 05:56     EKG Interpretation   Date/Time:  Friday  October 18 2014 04:34:06 EST Ventricular Rate:  119 PR Interval:  148 QRS Duration: 76 QT Interval:  342 QTC Calculation: 481 R Axis:   32 Text Interpretation:  Sinus tachycardia Ventricular premature complex  Consider right atrial enlargement artifact noted No significant change  since last tracing Confirmed by Christy Gentles  MD, Albana Saperstein ([id number]) on 10/18/2014  4:47:30 AM     Medications  sodium chloride 0.9 % bolus 1,000 mL (0 mLs Intravenous Stopped 10/18/14 0507)  sodium chloride 0.9 % bolus 1,000 mL (0 mLs Intravenous Stopped 10/18/14 0735)  sodium chloride 0.9 % bolus 500 mL (500 mLs Intravenous New Bag/Given 10/18/14 0739)  pantoprazole (PROTONIX) injection 40 mg (40 mg Intravenous Given 10/18/14 0739)     MDM   Final diagnoses:  Cough  Alcohol intoxication, with unspecified complication  Dehydration  Dark emesis  Upper GI bleed    Nursing notes including past medical history and social history reviewed and considered in documentation xrays/imaging reviewed by myself and considered during evaluation Labs/vital reviewed myself and considered during evaluation Previous records reviewed and considered     Sharyon Cable, MD 10/18/14 [id number]

## 2014-10-18 NOTE — H&P (Addendum)
[facility] History and Physical  [name] QTM:[id number] DOB: January 17, 1956 DOA: 10/18/2014  Referring physician: ED PCP: [name], MD  Specialists:  GI   Chief Complaint: Bleeding  HPI:  59 y/o ? just d/c 12/?/16-at that time admitted for ETOH abuse c known Chronic daily ETOH [voluntarily admit [facility] in the past], Last EGD Dr. 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 She states that she drank a half pint of gin along with her boyfriend and another friend who brought over half a gallon of white ROM. She had a couple of glasses of white ROM maybe about 2-38 ounce glasses. She is a daily chronic drinker and drinks about a 12 pack along with her boyfriend although there may be more than this. 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  Her hemoglobin is actually higher than it was on last admission She states that she left the [facility] last hospital visit and actually started having nausea the day of discharge  Emergency room workup revealed Sodium 140 potassium 3.4 BUN 5 creatinine 0.42 glucose 124, alkaline phosphatase 148, AST/ALT 136/50,  total protein 9.4 Hemoglobin 13.8 WBC 8.0 platelets 201 Blood alcohol level 316 Portable chest x-ray showed no acute cardiopulmonary process stable radiograph from 10/10/14 C Will score was 4 Patient noted to be tachycardic on admission but moderately hypertensive and not needing oxygen  Review of Systems: Denies melena denies fever denies chills denies cough denies cold denies blurred vision denies double vision denies unilateral week is denies rash denies sick contacts denies ill contacts Rest of review systems is negative   Past Medical History  Diagnosis Date  . Stroke     age 70, required brain surgery  . Seizures     since stroke, no recent seizures  . HTN (hypertension)   . PUD (peptic ulcer disease)     remote  . Shortness of breath     with exertion  . Contracture of muscle of hand     right  . Alcohol abuse   . Chronic abdominal pain   . Chronic diarrhea    Past Surgical History  Procedure Laterality Date  . Abdominal hysterectomy  2004    complete  . Right tib-fib fracture  2008  . Brain surgery      age 30, stroke  . Colonoscopy with propofol  10/03/2012    Procedure: COLONOSCOPY WITH PROPOFOL;  Surgeon: Danie Binder, MD;  Location: [facility];  Service: Endoscopy;  Laterality: N/A;  1230 in cecum, total withdrawel time 18 minutes, done at 1248  . Esophagogastroduodenoscopy (egd) with propofol  10/03/2012    Procedure: ESOPHAGOGASTRODUODENOSCOPY (EGD) WITH PROPOFOL;  Surgeon: Marga Melnick  Fields, MD;  Location: [facility];  Service: Endoscopy;  Laterality: N/A;  started at 1254  . Savory dilation  10/03/2012    Procedure: SAVORY DILATION;  Surgeon: Danie Binder, MD;  Location: [facility];  Service: Endoscopy;  Laterality: N/A;  started at 1303,  dilated 12.8-16mm   Social History:  History   Social History Narrative    Allergies  Allergen Reactions  . Other     Unable to recall med    Family History  Problem Relation Age of Onset  . Colon cancer Neg Hx   .  Liver disease Sister     etoh    Prior to Admission medications   Medication Sig Start Date End Date Taking? Authorizing Provider  albuterol (PROVENTIL HFA;VENTOLIN HFA) 108 (90 BASE) MCG/ACT inhaler Inhale 2 puffs into the lungs every 6 (six) hours as needed. FOR SHORTNESS OF BREATH AND/OR WHEEZING    Historical Provider, MD  amLODipine (NORVASC) 5 MG tablet Take 5 mg by mouth daily.    Historical Provider, MD  azithromycin (ZITHROMAX) 250 MG tablet 1 tablet daily starting Monday at dinnertime. 08/11/14   [name], MD  chlordiazePOXIDE (LIBRIUM) 25 MG capsule Take 25 mg by mouth 2 (two) times daily. 10/03/12   Danie Binder, MD  HYDROcodone-acetaminophen (NORCO) 7.5-325 MG per tablet Take 1 tablet by mouth every 4 (four) hours as needed (pain(must last 30 days)).    Historical Provider, MD  ipratropium-albuterol (DUONEB) 0.5-2.5 (3) MG/3ML SOLN Take 3 mLs by nebulization 4 (four) times daily.    Historical Provider, MD  lisinopril-hydrochlorothiazide (PRINZIDE,ZESTORETIC) 20-12.5 MG per tablet Take 1 tablet by mouth daily.    Historical Provider, MD  omeprazole (PRILOSEC) 20 MG capsule Take 20 mg by mouth daily.    Historical Provider, MD  PHENObarbital (LUMINAL) 30 MG tablet Take 30 mg by mouth 2 (two) times daily.    Historical Provider, MD  phenytoin (DILANTIN) 100 MG ER capsule Take 100 mg by mouth 3 (three) times daily.    Historical Provider, MD  predniSONE (DELTASONE) 50 MG tablet 1 tablet daily for 4 days, one half tablet daily for 4 days 08/11/14   [name], MD  traMADol (ULTRAM) 50 MG tablet Take 1 tablet (50 mg total) by mouth every 6 (six) hours as needed for pain. 08/17/13   Orpah Greek, MD   Physical Exam: Filed Vitals:   10/18/14 0600 10/18/14 0700 10/18/14 0730 10/18/14 0800  BP: 163/88 140/76 148/82 146/77  Pulse:   122 122  Temp:   98.2 F (36.8 C)   TempSrc:   Oral   Resp: 28 26 25  33  Height:      Weight:      SpO2:   97% 98%     General:  Alert  but still a little sleepy oriented to place  Eyes: EOMI no pallor no icterus funduscopy deferred  ENT: Moderate dentition Mallampati 1  Neck: Soft supple no JVD no bruit  Cardiovascular: S1-S2 tachycardia sinus regular rate rhythm  Respiratory: Clinically clear no added sound  Abdomen: Epigastric tenderness + +, generalized tenderness otherwise  Skin: No lower extremity edema  Musculoskeletal: Range of motion intact  Psychiatric: Euthymic slightly flat affect  Neurologic: Chronically contractured right upper extremity with minimal movement however reflexes in other extremities within normal limits power within normal limits 5/5 sensory not examined.  Labs on Admission:  Basic Metabolic Panel:  Recent Labs Lab 10/12/14 0627 10/18/14 0200  NA 137 140  K  3.8 3.4*  CL 103 101  CO2 29 25  GLUCOSE 82 124*  BUN <5* <5*  CREATININE 0.50 0.42*  CALCIUM 8.4 9.1   Liver Function Tests:  Recent Labs Lab 10/18/14 0200  AST 136*  ALT 50*  ALKPHOS 148*  BILITOT 0.5  PROT 9.4*  ALBUMIN 3.9   No results for input(s): LIPASE, AMYLASE in the last 168 hours. No results for input(s): AMMONIA in the last 168 hours. CBC:  Recent Labs Lab 10/18/14 0200  WBC 8.0  NEUTROABS 5.5  HGB 13.8  HCT 40.6  MCV 93.5  PLT 201   Cardiac Enzymes: No results for input(s): CKTOTAL, CKMB, CKMBINDEX, TROPONINI in the last 168 hours.  BNP (last 3 results) No results for input(s): PROBNP in the last 8760 hours. CBG: No results for input(s): GLUCAP in the last 168 hours.  Radiological Exams on Admission: Dg Chest Portable 1 View  10/18/2014   CLINICAL DATA:  Cough, alcohol intoxication.  EXAM: PORTABLE CHEST - 1 VIEW  COMPARISON:  Chest radiograph October 10, 2014  FINDINGS: Cardiomediastinal silhouette is unremarkable. The lungs are clear without pleural effusions or focal consolidations. Trachea projects midline and there is no pneumothorax. Healing RIGHT humeral head fracture.  Osteopenia.  IMPRESSION: No acute cardiopulmonary process; stable chest radiograph from October 10, 2014   Electronically Signed   By: Elon Alas   On: 10/18/2014 05:56    EKG: Independently reviewed. Nsr, PR 0.20, QRS axis 45, Sinus tach c Rate related changes, NO ST-T inversions or reciprocal    Assessment/Plan Principal Problem:   Hematemesis-secondary to acute alcoholic gastritis with contribution from hiatal hernia. I have consulted GI and we will cycle hemoglobin 8 hourly, we will volume replete her with IV saline at 1 25 cc per hour. I will type and screen her and monitor her. If we need to transfuse her we will get an anemia panel first. It seems like her hematemesis is not very severe. She has received IV Protonix 1 and we will dose his every 12 hourly for now I do not think she needs a bolus of this given the mildness of her symptomatology at this time. Active Problems:   History of cardiovascular disorder-unclear etiology. Monitor   History of rectal bleeding-she's had history of this in the past with diverticulosis as well as adenomas and may need to follow-up with her gastroenterologist Dr. [name] in the outpatient setting   Chronic diarrhea-thought to be secondary to either IBS or chronic ethanolism   GERD (gastroesophageal reflux disease)-see above discussion   Alcohol intoxication-blood alcohol level 316 , he shouldn't seems a little bit more willing to consider cessation. In the past she has not been interested in this. She will be placed on the IV Ativan CIWA protocol.   Sinus tachycardia-likely secondary to hematemesis. During last admission her heart rate was noted to be in the 80s around the time of discharge-I do think she has some volume depletion.  If she has CP we will cycle troponins and rpt EKG but this should resolve  c IVF Low risk sepsis- At this stage it is low yield to get a lactic acid because of her ethanolism which will spuriously skew this CXR  nl.   She'll be monitored on step down unit because of GI bleed, ethanol withdrawal 45 minutes Presumed full CODE STATUS No family at bedside  Admitted to Dr. Legrand Rams service  Verlon Au [facility] Pager [phone]  If 7PM-7AM, please contact night-coverage [email or web address] Password  [email or web address] 10/18/2014, 8:27 AM

## 2014-10-19 LAB — CBC
HCT: 30.6 % — ABNORMAL LOW (ref 36.0–46.0)
HCT: 29.3 % — ABNORMAL LOW (ref 36.0–46.0)
Hemoglobin: 10.3 g/dL — ABNORMAL LOW (ref 12.0–15.0)
Hemoglobin: 10 g/dL — ABNORMAL LOW (ref 12.0–15.0)
MCH: 31.9 pg (ref 26.0–34.0)
MCH: 32.3 pg (ref 26.0–34.0)
MCHC: 33.7 g/dL (ref 30.0–36.0)
MCHC: 34.1 g/dL (ref 30.0–36.0)
MCV: 94.7 fL (ref 78.0–100.0)
MCV: 94.5 fL (ref 78.0–100.0)
Platelets: 169 10*3/uL (ref 150–400)
Platelets: 158 10*3/uL (ref 150–400)
RBC: 3.23 MIL/uL — ABNORMAL LOW (ref 3.87–5.11)
RBC: 3.1 MIL/uL — ABNORMAL LOW (ref 3.87–5.11)
RDW: 14 % (ref 11.5–15.5)
RDW: 14 % (ref 11.5–15.5)
WBC: 5 10*3/uL (ref 4.0–10.5)
WBC: 4.2 10*3/uL (ref 4.0–10.5)

## 2014-10-19 LAB — COMPREHENSIVE METABOLIC PANEL
ALT: 35 U/L (ref 0–35)
AST: 80 U/L — ABNORMAL HIGH (ref 0–37)
Albumin: 2.8 g/dL — ABNORMAL LOW (ref 3.5–5.2)
Alkaline Phosphatase: 103 U/L (ref 39–117)
Anion gap: 7 (ref 5–15)
BUN: <5 mg/dL — ABNORMAL LOW (ref 6–23)
CO2: 21 mmol/L (ref 19–32)
Calcium: 8.4 mg/dL (ref 8.4–10.5)
Chloride: 107 mEq/L (ref 96–112)
Creatinine, Ser: 0.46 mg/dL — ABNORMAL LOW (ref 0.50–1.10)
GFR calc Af Amer: >90 mL/min (ref >90)
GFR calc non Af Amer: >90 mL/min (ref >90)
Glucose, Bld: 71 mg/dL (ref 70–99)
Potassium: 3.4 mmol/L — ABNORMAL LOW (ref 3.5–5.1)
Sodium: 135 mmol/L (ref 135–145)
Total Bilirubin: 0.6 mg/dL (ref 0.3–1.2)
Total Protein: 7 g/dL (ref 6.0–8.3)

## 2014-10-19 LAB — GLUCOSE, CAPILLARY
Glucose-Capillary: 80 mg/dL (ref 70–99)
Glucose-Capillary: 85 mg/dL (ref 70–99)

## 2014-10-19 MED ORDER — BENZONATATE 100 MG PO CAPS
100.0000 mg | ORAL_CAPSULE | Freq: Three times a day (TID) | ORAL | Status: DC | PRN
  Administered 2014-10-19: 100 mg via ORAL
  Filled 2014-10-19: qty 1

## 2014-10-19 NOTE — Progress Notes (Addendum)
Patient more alert this morning. No hematemesis or melena over night. Hemoglobin remained stable at 10.3 patient is hungry.   Vital signs in last 24 hours: Temp:  [97.4 F (36.3 C)-98.6 F (37 C)] 97.9 F (36.6 C) (01/02 0816) Pulse Rate:  [84-132] 84 (01/02 0700) Resp:  [18-47] 30 (01/02 0700) BP: (95-173)/(52-132) 112/58 mmHg (01/02 0700) SpO2:  [89 %-100 %] 99 % (01/02 0700) Weight:  [131 lb 9.8 oz (59.7 kg)-135 lb 5.8 oz (61.4 kg)] 135 lb 5.8 oz (61.4 kg) (01/02 0500) Last BM Date: 10/18/15 General:   Somewhat disheveled. More alert. Comfortable appearing and cooperative Abdomen:  Nondistended. Positive bowel sounds soft and nontender. Extremities:  Without clubbing or edema.    Intake/Output from previous day: 01/01 0701 - 01/02 0700 In: 1379.6 [P.O.:120; I.V.:1259.6] Out: 1300 [Urine:1300] Intake/Output this shift:    Lab Results:  Recent Labs  10/18/14 1114 10/18/14 1857 10/19/14 0259  WBC 5.6 4.4 5.0  HGB 10.2* 9.9* 10.3*  HCT 30.5* 29.7* 30.6*  PLT 152 145* 169   BMET  Recent Labs  10/18/14 0200 10/19/14 0433  NA 140 135  K 3.4* 3.4*  CL 101 107  CO2 25 21  GLUCOSE 124* 71  BUN <5* <5*  CREATININE 0.42* 0.46*  CALCIUM 9.1 8.4   LFT  Recent Labs  10/19/14 0433  PROT 7.0  ALBUMIN 2.8*  AST 80*  ALT 35  ALKPHOS 103  BILITOT 0.6   PT/INR  Recent Labs  10/18/14 1114  LABPROT 14.2  INR 1.09    Impression:   Severe reflux esophagitis exacerbated by nausea and vomiting in the setting of acute alcohol intoxication producing a relatively trivial upper GI bleed-improved. No esophageal varices. Mild alcoholic hepatitis.  Recommendations:  Continue Protonix 40 mg orally twice daily. Carafate suspension 4 times a day 2 weeks. Alcohol cessation. Advance to a heart healthy diet.  Okay to move out of the ICU from a GI standpoint. I'll arrange outpatient follow-up with Dr. [name], patient's primary gastroenterologist in about 4 weeks. My  recommendations have been discussed with Dr. Verlon Au and nursing staff at the ICU nurses station

## 2014-10-19 NOTE — Plan of Care (Signed)
Problem: Phase I Progression Outcomes Goal: Pain controlled with appropriate interventions Outcome: Completed/Met Date Met:  10/19/14 No complaints of pain Goal: OOB as tolerated unless otherwise ordered Outcome: Progressing oob to bsc with standby assist Goal: Initial discharge plan identified Outcome: [place] with significant other

## 2014-10-19 NOTE — Progress Notes (Signed)
Nutrition Brief Note  Patient identified on the Malnutrition Screening Tool (MST) Report  Wt Readings from Last 15 Encounters:  10/19/14 135 lb 5.8 oz (61.4 kg)  10/10/14 136 lb (61.689 kg)  08/11/14 136 lb (61.689 kg)  09/05/12 142 lb 9.6 oz (64.683 kg)  01/06/12 148 lb (67.132 kg)   This is a 59 years old female known case f multiple medical illnesses including ETOH abuse was admitted yesterday due to coffee ground vomiting and acute alcoholic intoxication.  Wt has been stable over the past year. Intake has improved since yesterday, due to improvement in nausea and vomiting. Pt was recently advanced to a Heart Healthy diet.   Body mass index is 25.59 kg/(m^2). Patient meets criteria for overweight based on current BMI.   Current diet order is Heart Healthy, patient is consuming approximately 25% (with improving appetite) of meals at this time. Labs and medications reviewed.   No nutrition interventions warranted at this time. If nutrition issues arise, please consult RD.   Krystall Kruckenberg A. Jimmye Norman, RD, LDN, CDE Pager: [phone]

## 2014-10-19 NOTE — Progress Notes (Signed)
Subjective: This is a 59 years old female known case f multiple medical illnesses including ETOH abuse was admitted yesterday due to coffee ground vomiting and acute alcoholic intoxication.  Objective: Vital signs in last 24 hours: Temp:  [97.4 F (36.3 C)-98.6 F (37 C)] 97.9 F (36.6 C) (01/02 0816) Pulse Rate:  [84-132] 84 (01/02 0700) Resp:  [18-47] 30 (01/02 0700) BP: (95-173)/(52-132) 112/58 mmHg (01/02 0700) SpO2:  [89 %-100 %] 99 % (01/02 0700) Weight:  [61.4 kg (135 lb 5.8 oz)] 61.4 kg (135 lb 5.8 oz) (01/02 0500) Weight change: 3 kg (6 lb 9.8 oz) Last BM Date: 10/18/15  Intake/Output from previous day: 01/01 0701 - 01/02 0700 In: 1379.6 [P.O.:120; I.V.:1259.6] Out: 1300 [Urine:1300]  PHYSICAL EXAM General appearance: alert and no distress Resp: diminished breath sounds bilaterally and rhonchi bilaterally Cardio: S1, S2 normal GI: soft, non-tender; bowel sounds normal; no masses,  no organomegaly Extremities: extremities normal, atraumatic, no cyanosis or edema  Lab Results:  Results for orders placed or performed during the hospital encounter of 10/18/14 (from the past 48 hour(s))  CBC with Differential     Status: None   Collection Time: 10/18/14  2:00 AM  Result Value Ref Range   WBC 8.0 4.0 - 10.5 K/uL   RBC 4.34 3.87 - 5.11 MIL/uL   Hemoglobin 13.8 12.0 - 15.0 g/dL   HCT 40.6 36.0 - 46.0 %   MCV 93.5 78.0 - 100.0 fL   MCH 31.8 26.0 - 34.0 pg   MCHC 34.0 30.0 - 36.0 g/dL   RDW 14.0 11.5 - 15.5 %   Platelets 201 150 - 400 K/uL   Neutrophils Relative % 68 43 - 77 %   Neutro Abs 5.5 1.7 - 7.7 K/uL   Lymphocytes Relative 20 12 - 46 %   Lymphs Abs 1.6 0.7 - 4.0 K/uL   Monocytes Relative 10 3 - 12 %   Monocytes Absolute 0.8 0.1 - 1.0 K/uL   Eosinophils Relative 1 0 - 5 %   Eosinophils Absolute 0.0 0.0 - 0.7 K/uL   Basophils Relative 1 0 - 1 %   Basophils Absolute 0.0 0.0 - 0.1 K/uL  Comprehensive metabolic panel     Status: Abnormal   Collection Time:  10/18/14  2:00 AM  Result Value Ref Range   Sodium 140 135 - 145 mmol/L    Comment: Please note change in reference range.   Potassium 3.4 (L) 3.5 - 5.1 mmol/L    Comment: Please note change in reference range.   Chloride 101 96 - 112 mEq/L   CO2 25 19 - 32 mmol/L   Glucose, Bld 124 (H) 70 - 99 mg/dL   BUN <5 (L) 6 - 23 mg/dL   Creatinine, Ser 0.42 (L) 0.50 - 1.10 mg/dL   Calcium 9.1 8.4 - 10.5 mg/dL   Total Protein 9.4 (H) 6.0 - 8.3 g/dL   Albumin 3.9 3.5 - 5.2 g/dL   AST 136 (H) 0 - 37 U/L   ALT 50 (H) 0 - 35 U/L   Alkaline Phosphatase 148 (H) 39 - 117 U/L   Total Bilirubin 0.5 0.3 - 1.2 mg/dL   GFR calc non Af Amer >90 >90 mL/min   GFR calc Af Amer >90 >90 mL/min    Comment: (NOTE) The eGFR has been calculated using the CKD EPI equation. This calculation has not been validated in all clinical situations. eGFR's persistently <90 mL/min signify possible Chronic Kidney Disease.    Anion gap 14  5 - 15  Ethanol     Status: Abnormal   Collection Time: 10/18/14  2:00 AM  Result Value Ref Range   Alcohol, Ethyl (B) 316 (H) 0 - 9 mg/dL    Comment:        LOWEST DETECTABLE LIMIT FOR SERUM ALCOHOL IS 11 mg/dL FOR MEDICAL PURPOSES ONLY   pH, gastric fluid (gastroccult card)     Status: None   Collection Time: 10/18/14  7:35 AM  Result Value Ref Range   pH, Gastric 1   Occult bld gastric/duodenum (cup to lab)     Status: Abnormal   Collection Time: 10/18/14  8:10 AM  Result Value Ref Range   pH, Gastric 1    Occult Blood, Gastric POSITIVE (A) NEGATIVE  MRSA PCR Screening     Status: None   Collection Time: 10/18/14  9:50 AM  Result Value Ref Range   MRSA by PCR NEGATIVE NEGATIVE    Comment:        The GeneXpert MRSA Assay (FDA approved for NASAL specimens only), is one component of a comprehensive MRSA colonization surveillance program. It is not intended to diagnose MRSA infection nor to guide or monitor treatment for MRSA infections.   CBC     Status: Abnormal    Collection Time: 10/18/14 11:14 AM  Result Value Ref Range   WBC 5.6 4.0 - 10.5 K/uL   RBC 3.25 (L) 3.87 - 5.11 MIL/uL   Hemoglobin 10.2 (L) 12.0 - 15.0 g/dL    Comment: DELTA CHECK NOTED   HCT 30.5 (L) 36.0 - 46.0 %   MCV 93.8 78.0 - 100.0 fL   MCH 31.4 26.0 - 34.0 pg   MCHC 33.4 30.0 - 36.0 g/dL   RDW 13.9 11.5 - 15.5 %   Platelets 152 150 - 400 K/uL    Comment: DELTA CHECK NOTED  Protime-INR     Status: None   Collection Time: 10/18/14 11:14 AM  Result Value Ref Range   Prothrombin Time 14.2 11.6 - 15.2 seconds   INR 1.09 0.00 - 1.49  CBC     Status: Abnormal   Collection Time: 10/18/14  6:57 PM  Result Value Ref Range   WBC 4.4 4.0 - 10.5 K/uL   RBC 3.14 (L) 3.87 - 5.11 MIL/uL   Hemoglobin 9.9 (L) 12.0 - 15.0 g/dL   HCT 29.7 (L) 36.0 - 46.0 %   MCV 94.6 78.0 - 100.0 fL   MCH 31.5 26.0 - 34.0 pg   MCHC 33.3 30.0 - 36.0 g/dL   RDW 14.1 11.5 - 15.5 %   Platelets 145 (L) 150 - 400 K/uL  CBC     Status: Abnormal   Collection Time: 10/19/14  2:59 AM  Result Value Ref Range   WBC 5.0 4.0 - 10.5 K/uL   RBC 3.23 (L) 3.87 - 5.11 MIL/uL   Hemoglobin 10.3 (L) 12.0 - 15.0 g/dL   HCT 30.6 (L) 36.0 - 46.0 %   MCV 94.7 78.0 - 100.0 fL   MCH 31.9 26.0 - 34.0 pg   MCHC 33.7 30.0 - 36.0 g/dL   RDW 14.0 11.5 - 15.5 %   Platelets 169 150 - 400 K/uL  Comprehensive metabolic panel     Status: Abnormal   Collection Time: 10/19/14  4:33 AM  Result Value Ref Range   Sodium 135 135 - 145 mmol/L    Comment: Please note change in reference range.   Potassium 3.4 (L) 3.5 - 5.1 mmol/L  Comment: Please note change in reference range.   Chloride 107 96 - 112 mEq/L   CO2 21 19 - 32 mmol/L   Glucose, Bld 71 70 - 99 mg/dL   BUN <5 (L) 6 - 23 mg/dL   Creatinine, Ser 0.46 (L) 0.50 - 1.10 mg/dL   Calcium 8.4 8.4 - 10.5 mg/dL   Total Protein 7.0 6.0 - 8.3 g/dL   Albumin 2.8 (L) 3.5 - 5.2 g/dL   AST 80 (H) 0 - 37 U/L   ALT 35 0 - 35 U/L   Alkaline Phosphatase 103 39 - 117 U/L   Total  Bilirubin 0.6 0.3 - 1.2 mg/dL   GFR calc non Af Amer >90 >90 mL/min   GFR calc Af Amer >90 >90 mL/min    Comment: (NOTE) The eGFR has been calculated using the CKD EPI equation. This calculation has not been validated in all clinical situations. eGFR's persistently <90 mL/min signify possible Chronic Kidney Disease.    Anion gap 7 5 - 15  Glucose, capillary     Status: None   Collection Time: 10/19/14  7:55 AM  Result Value Ref Range   Glucose-Capillary 80 70 - 99 mg/dL   Comment 1 Documented in Chart    Comment 2 Notify RN   CBC     Status: Abnormal   Collection Time: 10/19/14 10:35 AM  Result Value Ref Range   WBC 4.2 4.0 - 10.5 K/uL   RBC 3.10 (L) 3.87 - 5.11 MIL/uL   Hemoglobin 10.0 (L) 12.0 - 15.0 g/dL   HCT 29.3 (L) 36.0 - 46.0 %   MCV 94.5 78.0 - 100.0 fL   MCH 32.3 26.0 - 34.0 pg   MCHC 34.1 30.0 - 36.0 g/dL   RDW 14.0 11.5 - 15.5 %   Platelets 158 150 - 400 K/uL    ABGS No results for input(s): PHART, PO2ART, TCO2, HCO3 in the last 72 hours.  Invalid input(s): PCO2 CULTURES Recent Results (from the past 240 hour(s))  MRSA PCR Screening     Status: None   Collection Time: 10/18/14  9:50 AM  Result Value Ref Range Status   MRSA by PCR NEGATIVE NEGATIVE Final    Comment:        The GeneXpert MRSA Assay (FDA approved for NASAL specimens only), is one component of a comprehensive MRSA colonization surveillance program. It is not intended to diagnose MRSA infection nor to guide or monitor treatment for MRSA infections.    Studies/Results: Dg Chest Portable 1 View  10/18/2014   CLINICAL DATA:  Cough, alcohol intoxication.  EXAM: PORTABLE CHEST - 1 VIEW  COMPARISON:  Chest radiograph October 10, 2014  FINDINGS: Cardiomediastinal silhouette is unremarkable. The lungs are clear without pleural effusions or focal consolidations. Trachea projects midline and there is no pneumothorax. Healing RIGHT humeral head fracture. Osteopenia.  IMPRESSION: No acute  cardiopulmonary process; stable chest radiograph from October 10, 2014   Electronically Signed   By: Elon Alas   On: 10/18/2014 05:56    Medications: I have reviewed the patient's current medications.  Assesment:   Principal Problem:   Hematemesis Active Problems:   History of cardiovascular disorder   History of rectal bleeding   Chronic diarrhea   GERD (gastroesophageal reflux disease)   Alcohol intoxication   Sinus tachycardia   Upper GI bleed   Reflux esophagitis    Plan:  Medications reviewed GI consult appreciated Will continue to monitor CBC/BMP Will watch for sign of DT.  LOS: 1 day   Porshe Fleagle 10/19/2014, 11:16 AM

## 2014-10-20 MED ORDER — PANTOPRAZOLE SODIUM 40 MG PO TBEC: 40.0000 mg | DELAYED_RELEASE_TABLET | Freq: Two times a day (BID) | ORAL | Status: DC

## 2014-10-20 MED ORDER — SUCRALFATE 1 GM/10ML PO SUSP: 1.0000 g | Freq: Three times a day (TID) | ORAL | Status: DC

## 2014-10-20 MED ORDER — THIAMINE HCL 100 MG PO TABS: 100.0000 mg | ORAL_TABLET | Freq: Every day | ORAL | Status: DC

## 2014-10-20 NOTE — Discharge Summary (Signed)
Physician Discharge Summary  Patient ID: [name] MRN: [id number] DOB/AGE: [birth year] 59 y.o. Primary Care Physician:Elion Hocker, MD Admit date: 10/18/2014 Discharge date: 10/20/2014    Discharge Diagnoses:  Principal Problem:   Hematemesis Active Problems:   History of cardiovascular disorder   History of rectal bleeding   Chronic diarrhea   GERD (gastroesophageal reflux disease)   Alcohol intoxication   Sinus tachycardia   Upper GI bleed   Reflux esophagitis     Medication List    STOP taking these medications        azithromycin 250 MG tablet  Commonly known as:  ZITHROMAX     chlordiazePOXIDE 25 MG capsule  Commonly known as:  LIBRIUM     HYDROcodone-acetaminophen 7.5-325 MG per tablet  Commonly known as:  NORCO     omeprazole 20 MG capsule  Commonly known as:  PRILOSEC     predniSONE 50 MG tablet  Commonly known as:  DELTASONE      TAKE these medications        albuterol 108 (90 BASE) MCG/ACT inhaler  Commonly known as:  PROVENTIL HFA;VENTOLIN HFA  Inhale 2 puffs into the lungs every 6 (six) hours as needed. FOR SHORTNESS OF BREATH AND/OR WHEEZING     amLODipine 5 MG tablet  Commonly known as:  NORVASC  Take 5 mg by mouth daily.     ipratropium-albuterol 0.5-2.5 (3) MG/3ML Soln  Commonly known as:  DUONEB  Take 3 mLs by nebulization 4 (four) times daily.     lisinopril-hydrochlorothiazide 20-12.5 MG per tablet  Commonly known as:  PRINZIDE,ZESTORETIC  Take 1 tablet by mouth daily.     pantoprazole 40 MG tablet  Commonly known as:  PROTONIX  Take 1 tablet (40 mg total) by mouth 2 (two) times daily.     PHENObarbital 30 MG tablet  Commonly known as:  LUMINAL  Take 30 mg by mouth 2 (two) times daily.     phenytoin 100 MG ER capsule  Commonly known as:  DILANTIN  Take 100 mg by mouth 3 (three) times daily.     sucralfate 1 GM/10ML suspension  Commonly known as:  CARAFATE  Take 10 mLs (1 g total) by mouth 4 (four) times daily -  with  meals and at bedtime.     thiamine 100 MG tablet  Take 1 tablet (100 mg total) by mouth daily.     traMADol 50 MG tablet  Commonly known as:  ULTRAM  Take 1 tablet (50 mg total) by mouth every 6 (six) hours as needed for pain.        Discharged Condition: home    Consults: GI   Significant Diagnostic Studies: Dg Chest 1 View  10/10/2014   CLINICAL DATA:  Altered mental status  EXAM: CHEST - 1 VIEW  COMPARISON:  08/11/2014  FINDINGS: Patient rotated to the right. Allowing for this positioning, the lungs are clear. Negative for pneumonia. Negative for heart failure or effusion.  Chronic healing fracture right humeral neck.  IMPRESSION: No active disease.   Electronically Signed   By: Marlan Palau M.D.   On: 10/10/2014 07:30   Ct Head Wo Contrast  10/10/2014   CLINICAL DATA:  Drug overdose with syncope  EXAM: CT HEAD WITHOUT CONTRAST  TECHNIQUE: Contiguous axial images were obtained from the base of the skull through the vertex without intravenous contrast.  COMPARISON:  October 08, 2010  FINDINGS: There is mild diffuse atrophy. Enlargement of both anterior horn lateral ventricles, larger on  the left than on the right, is due to ex vacuo phenomenon. There is a calcified aneurysm remnant at the distal most aspect of the right internal carotid artery measuring 1.0 x 1.0 cm, stable trauma with an adjacent aneurysm clip which also appears stable. There is no intracranial mass, acute hemorrhage, extra-axial fluid collection, or midline shift. 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 These infarcts appear stable in size and distribution. There is small vessel disease in the centra semiovale bilaterally, more on the left than on the right, stable. There is no new gray-white compartment lesion. No acute infarct apparent. Bony calvarium appears intact except for stable postoperative craniotomy changes on the right. The  mastoid air cells are clear.  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 Calcified remains of this aneurysm are stable. There is no mass, hemorrhage, or acute appearing infarct. 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 There is mild atrophy with ex vacuo phenomenon causing enlargement of the frontal horns of each lateral ventricle, more on the left than on the right, stable. Appearance is essentially stable compared to prior study.   Electronically Signed   By: Bretta Bang M.D.   On: 10/10/2014 07:30   Dg Chest Portable 1 View  10/18/2014   CLINICAL DATA:  Cough, alcohol intoxication.  EXAM: PORTABLE CHEST - 1 VIEW  COMPARISON:  Chest radiograph October 10, 2014  FINDINGS: Cardiomediastinal silhouette is unremarkable. The lungs are clear without pleural effusions or focal consolidations. Trachea projects midline and there is no pneumothorax. Healing RIGHT humeral head fracture. Osteopenia.  IMPRESSION: No acute cardiopulmonary process; stable chest radiograph from October 10, 2014   Electronically Signed   By: Awilda Metro   On: 10/18/2014 05:56   Mm Digital Screening Bilateral  10/03/2014   CLINICAL DATA:  Screening.  EXAM: DIGITAL SCREENING BILATERAL MAMMOGRAM WITH CAD  COMPARISON:  Previous exam(s).  ACR Breast Density Category b: There are scattered areas of fibroglandular density.  FINDINGS: There are no findings suspicious for malignancy. Images were processed with CAD.  IMPRESSION: No mammographic evidence of malignancy. A result letter of this screening mammogram will be mailed directly to the patient.  RECOMMENDATION: Screening mammogram in one year. (Code:SM-B-01Y)  BI-RADS CATEGORY  1: Negative.   Electronically Signed   By: Gordan Payment M.D.   On: 10/03/2014 16:49    Lab Results: Basic Metabolic Panel:  Recent Labs  08/65/78 0200 10/19/14 0433  NA 140 135  K 3.4* 3.4*  CL 101 107  CO2 25 21   GLUCOSE 124* 71  BUN <5* <5*  CREATININE 0.42* 0.46*  CALCIUM 9.1 8.4   Liver Function Tests:  Recent Labs  10/18/14 0200 10/19/14 0433  AST 136* 80*  ALT 50* 35  ALKPHOS 148* 103  BILITOT 0.5 0.6  PROT 9.4* 7.0  ALBUMIN 3.9 2.8*     CBC:  Recent Labs  10/18/14 0200  10/19/14 0259 10/19/14 1035  WBC 8.0  < > 5.0 4.2  NEUTROABS 5.5  --   --   --   HGB 13.8  < > 10.3* 10.0*  HCT 40.6  < > 30.6* 29.3*  MCV 93.5  < > 94.7 94.5  PLT 201  < > 169 158  < > = values in this interval not displayed.  Recent Results (from the past 240 hour(s))  MRSA  PCR Screening     Status: None   Collection Time: 10/18/14  9:50 AM  Result Value Ref Range Status   MRSA by PCR NEGATIVE NEGATIVE Final    Comment:        The GeneXpert MRSA Assay (FDA approved for NASAL specimens only), is one component of a comprehensive MRSA colonization surveillance program. It is not intended to diagnose MRSA infection nor to guide or monitor treatment for MRSA infections.      Hospital Course:   This is a 59 years old female with history multiple medical illnesses including alcohol abuse was admitted due hematemesis and alcohol intoxication. GI consult ewas done patient had EGD which showed  Severe erosive esophagitis. Patient was started on pprotonox 40 mg BID and Carafate.  Patient improved. Her HGb remained stable and she is being discharged in stable condition. Patient strongly advised to stop alcohol abuse.  Discharge Exam: Blood pressure 154/78, pulse 91, temperature 98.7 F (37.1 C), temperature source Oral, resp. rate 20, height 5\' 1"  (1.549 m), weight 60.691 kg (133 lb 12.8 oz), SpO2 99 %.   Disposition:  home      Signed: Avonda Toso   10/20/2014, 10:14 AM

## 2014-10-20 NOTE — Progress Notes (Signed)
Patient discharging home.  IV removed - WNL.  Reviewed medications with patient.  Also instructed on alcohol cessation or at least cutting back.  Explained effects of alcohol on her health.  Patient verbalizes understanding of DC instructions.  Care notes also given on alcohol and medications, and esophagitis. NO questions at this time.  In stable condition a this time. Patient is waiting for boyfriend to arrive to bring clothes and take her home.  At that time patient will be taken downstairs via [facility] with assistance from staff.

## 2014-10-21 ENCOUNTER — Encounter (HOSPITAL_COMMUNITY): Payer: Self-pay | Admitting: Internal Medicine

## 2014-10-21 LAB — OCCULT BLOOD, POC DEVICE: Fecal Occult Bld: NEGATIVE

## 2014-10-22 NOTE — Care Management Note (Signed)
UR completed 

## 2014-11-15 NOTE — Discharge Summary (Signed)
Physician Discharge Summary  Patient ID: [name] MRN: [id number] DOB/AGE: 05/21/1956 59 y.o. Primary Care Physician:Smith Potenza, MD Admit date: 10/10/2014 Discharge date: 11/13/2013    Discharge Diagnoses:   Principal Problem:   Alcohol intoxication Active Problems:   Sinus tachycardia   Hypokalemia   Seizure disorder     Medication List    ASK your doctor about these medications        albuterol 108 (90 BASE) MCG/ACT inhaler  Commonly known as:  PROVENTIL HFA;VENTOLIN HFA  Inhale 2 puffs into the lungs every 6 (six) hours as needed. FOR SHORTNESS OF BREATH AND/OR WHEEZING     amLODipine 5 MG tablet  Commonly known as:  NORVASC  Take 5 mg by mouth daily.     ipratropium-albuterol 0.5-2.5 (3) MG/3ML Soln  Commonly known as:  DUONEB  Take 3 mLs by nebulization 4 (four) times daily.     lisinopril-hydrochlorothiazide 20-12.5 MG per tablet  Commonly known as:  PRINZIDE,ZESTORETIC  Take 1 tablet by mouth daily.     PHENObarbital 30 MG tablet  Commonly known as:  LUMINAL  Take 30 mg by mouth 2 (two) times daily.     phenytoin 100 MG ER capsule  Commonly known as:  DILANTIN  Take 100 mg by mouth 3 (three) times daily.     traMADol 50 MG tablet  Commonly known as:  ULTRAM  Take 1 tablet (50 mg total) by mouth every 6 (six) hours as needed for pain.        Discharged Condition: improved    Consults: none  Significant Diagnostic Studies: Dg Chest Portable 1 View  10/18/2014   CLINICAL DATA:  Cough, alcohol intoxication.  EXAM: PORTABLE CHEST - 1 VIEW  COMPARISON:  Chest radiograph October 10, 2014  FINDINGS: Cardiomediastinal silhouette is unremarkable. The lungs are clear without pleural effusions or focal consolidations. Trachea projects midline and there is no pneumothorax. Healing RIGHT humeral head fracture. Osteopenia.  IMPRESSION: No acute cardiopulmonary process; stable chest radiograph from October 10, 2014   Electronically Signed   By:  Elon Alas   On: 10/18/2014 05:56    Lab Results: Basic Metabolic Panel: No results for input(s): NA, K, CL, CO2, GLUCOSE, BUN, CREATININE, CALCIUM, MG, PHOS in the last 72 hours. Liver Function Tests: No results for input(s): AST, ALT, ALKPHOS, BILITOT, PROT, ALBUMIN in the last 72 hours.   CBC: No results for input(s): WBC, NEUTROABS, HGB, HCT, MCV, PLT in the last 72 hours.  No results found for this or any previous visit (from the past 240 hour(s)).   Hospital Course:   This is a 59 years old female with history of multiple medical illness was admitted due due to acute alcohol intoxication. She was treated according to protocol and gradual improved and was transfer to the floor from ICU. While patient was on the floor she signed out AMA before she was fully improved.  Discharge Exam: Blood pressure 94/65, pulse 102, temperature 97.8 F (36.6 C), temperature source Oral, resp. rate 18, height 5\' 1"  (1.549 m), weight 61.689 kg (136 lb), SpO2 100 %.   Disposition:  Home.      Signed: Manvir Prabhu   11/15/2014, 8:08 AM

## 2014-12-04 ENCOUNTER — Other Ambulatory Visit (HOSPITAL_COMMUNITY): Payer: Self-pay | Admitting: Internal Medicine

## 2014-12-04 DIAGNOSIS — M810 Age-related osteoporosis without current pathological fracture: Secondary | ICD-10-CM

## 2014-12-09 ENCOUNTER — Other Ambulatory Visit (HOSPITAL_COMMUNITY): Payer: Medicare Other

## 2014-12-09 ENCOUNTER — Other Ambulatory Visit (HOSPITAL_COMMUNITY): Payer: Self-pay | Admitting: Internal Medicine

## 2014-12-09 DIAGNOSIS — M810 Age-related osteoporosis without current pathological fracture: Secondary | ICD-10-CM

## 2014-12-26 ENCOUNTER — Emergency Department (HOSPITAL_COMMUNITY)
Admission: EM | Admit: 2014-12-26 | Discharge: 2014-12-27 | Disposition: A | Discharge status: Home / Self Care | Payer: Medicare Other | Attending: Emergency Medicine | Admitting: Emergency Medicine

## 2014-12-26 ENCOUNTER — Encounter (HOSPITAL_COMMUNITY): Payer: Self-pay | Admitting: Cardiology

## 2014-12-26 DIAGNOSIS — I10 Essential (primary) hypertension: Secondary | ICD-10-CM | POA: Insufficient documentation

## 2014-12-26 DIAGNOSIS — F10120 Alcohol abuse with intoxication, uncomplicated: Secondary | ICD-10-CM | POA: Diagnosis not present

## 2014-12-26 DIAGNOSIS — Z87891 Personal history of nicotine dependence: Secondary | ICD-10-CM | POA: Diagnosis not present

## 2014-12-26 DIAGNOSIS — Z8711 Personal history of peptic ulcer disease: Secondary | ICD-10-CM | POA: Insufficient documentation

## 2014-12-26 DIAGNOSIS — R197 Diarrhea, unspecified: Secondary | ICD-10-CM | POA: Diagnosis present

## 2014-12-26 DIAGNOSIS — G40909 Epilepsy, unspecified, not intractable, without status epilepticus: Secondary | ICD-10-CM | POA: Insufficient documentation

## 2014-12-26 DIAGNOSIS — Z8673 Personal history of transient ischemic attack (TIA), and cerebral infarction without residual deficits: Secondary | ICD-10-CM | POA: Insufficient documentation

## 2014-12-26 DIAGNOSIS — G8929 Other chronic pain: Secondary | ICD-10-CM | POA: Insufficient documentation

## 2014-12-26 DIAGNOSIS — F10920 Alcohol use, unspecified with intoxication, uncomplicated: Secondary | ICD-10-CM

## 2014-12-26 DIAGNOSIS — R4701 Aphasia: Secondary | ICD-10-CM | POA: Insufficient documentation

## 2014-12-26 DIAGNOSIS — Z79899 Other long term (current) drug therapy: Secondary | ICD-10-CM | POA: Diagnosis not present

## 2014-12-26 LAB — COMPREHENSIVE METABOLIC PANEL
ALT: 32 U/L (ref 0–35)
AST: 65 U/L — ABNORMAL HIGH (ref 0–37)
Albumin: 4 g/dL (ref 3.5–5.2)
Alkaline Phosphatase: 112 U/L (ref 39–117)
Anion gap: 12 (ref 5–15)
BUN: 4 mg/dL — ABNORMAL LOW (ref 6–23)
CO2: 21 mmol/L (ref 19–32)
Calcium: 9.1 mg/dL (ref 8.4–10.5)
Chloride: 104 mmol/L (ref 96–112)
Creatinine, Ser: 0.48 mg/dL — ABNORMAL LOW (ref 0.50–1.10)
GFR calc Af Amer: >90 mL/min (ref >90)
GFR calc non Af Amer: >90 mL/min (ref >90)
Glucose, Bld: 77 mg/dL (ref 70–99)
Potassium: 2.9 mmol/L — ABNORMAL LOW (ref 3.5–5.1)
Sodium: 137 mmol/L (ref 135–145)
Total Bilirubin: 0.5 mg/dL (ref 0.3–1.2)
Total Protein: 8.2 g/dL (ref 6.0–8.3)

## 2014-12-26 LAB — CBC WITH DIFFERENTIAL/PLATELET
Basophils Absolute: 0 10*3/uL (ref 0.0–0.1)
Basophils Relative: 0 % (ref 0–1)
Eosinophils Absolute: 0 10*3/uL (ref 0.0–0.7)
Eosinophils Relative: 0 % (ref 0–5)
HCT: 33.7 % — ABNORMAL LOW (ref 36.0–46.0)
Hemoglobin: 11.4 g/dL — ABNORMAL LOW (ref 12.0–15.0)
Lymphocytes Relative: 35 % (ref 12–46)
Lymphs Abs: 3.1 10*3/uL (ref 0.7–4.0)
MCH: 29.9 pg (ref 26.0–34.0)
MCHC: 33.8 g/dL (ref 30.0–36.0)
MCV: 88.5 fL (ref 78.0–100.0)
Monocytes Absolute: 0.7 10*3/uL (ref 0.1–1.0)
Monocytes Relative: 7 % (ref 3–12)
Neutro Abs: 5.2 10*3/uL (ref 1.7–7.7)
Neutrophils Relative %: 58 % (ref 43–77)
Platelets: 162 10*3/uL (ref 150–400)
RBC: 3.81 MIL/uL — ABNORMAL LOW (ref 3.87–5.11)
RDW: 14.1 % (ref 11.5–15.5)
WBC: 9 10*3/uL (ref 4.0–10.5)

## 2014-12-26 LAB — PROTIME-INR
INR: 1.04 (ref 0.00–1.49)
Prothrombin Time: 13.7 seconds (ref 11.6–15.2)

## 2014-12-26 LAB — URINALYSIS, ROUTINE W REFLEX MICROSCOPIC
Bilirubin Urine: NEGATIVE
Glucose, UA: NEGATIVE mg/dL
Hgb urine dipstick: NEGATIVE
Ketones, ur: NEGATIVE mg/dL
Leukocytes, UA: NEGATIVE
Nitrite: NEGATIVE
Protein, ur: NEGATIVE mg/dL
Specific Gravity, Urine: <1.005 — ABNORMAL LOW (ref 1.005–1.030)
Urobilinogen, UA: 0.2 mg/dL (ref 0.0–1.0)
pH: 5.5 (ref 5.0–8.0)

## 2014-12-26 LAB — POC OCCULT BLOOD, ED: Fecal Occult Bld: NEGATIVE

## 2014-12-26 LAB — RAPID URINE DRUG SCREEN, HOSP PERFORMED
Amphetamines: NOT DETECTED
Barbiturates: NOT DETECTED
Benzodiazepines: NOT DETECTED
Cocaine: NOT DETECTED
Opiates: NOT DETECTED
Tetrahydrocannabinol: NOT DETECTED

## 2014-12-26 LAB — ETHANOL: Alcohol, Ethyl (B): 302 mg/dL — ABNORMAL HIGH (ref 0–9)

## 2014-12-26 MED ORDER — SODIUM CHLORIDE 0.9 % IV BOLUS (SEPSIS)
1000.0000 mL | Freq: Once | INTRAVENOUS | Status: AC
  Administered 2014-12-26: 1000 mL via INTRAVENOUS

## 2014-12-26 MED ORDER — POTASSIUM CHLORIDE CRYS ER 20 MEQ PO TBCR
40.0000 meq | EXTENDED_RELEASE_TABLET | Freq: Once | ORAL | Status: AC
  Administered 2014-12-26: 40 meq via ORAL
  Filled 2014-12-26: qty 2

## 2014-12-26 NOTE — ED Notes (Signed)
Pt. Given meal. Pt. Resting in bed. No distress noted.

## 2014-12-26 NOTE — ED Notes (Addendum)
Nausea, vomiting and diarrhea that started  at 1 pm today.  States she sees blood in her emesis and stools.
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
## 2014-12-26 NOTE — ED Notes (Signed)
Pt. Ate meal and drank sprite. Pt. Denies nausea at present.
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
## 2014-12-26 NOTE — ED Notes (Signed)
Lab called regarding ETOH results. Reported they would run it now.

## 2014-12-27 NOTE — Discharge Instructions (Signed)
Alcohol Intoxication  Alcohol intoxication occurs when the amount of alcohol that a person has consumed impairs his or her ability to mentally and physically function. Alcohol directly impairs the normal chemical activity of the brain. Drinking large amounts of alcohol can lead to changes in mental function and behavior, and it can cause many physical effects that can be harmful.   Alcohol intoxication can range in severity from mild to very severe. Various factors can affect the level of intoxication that occurs, such as the person's age, gender, weight, frequency of alcohol consumption, and the presence of other medical conditions (such as diabetes, seizures, or heart conditions). Dangerous levels of alcohol intoxication may occur when people drink large amounts of alcohol in a short period (binge drinking). Alcohol can also be especially dangerous when combined with certain prescription medicines or "recreational" drugs.  SIGNS AND SYMPTOMS  Some common signs and symptoms of mild alcohol intoxication include:  · Loss of coordination.  · Changes in mood and behavior.  · Impaired judgment.  · Slurred speech.  As alcohol intoxication progresses to more severe levels, other signs and symptoms will appear. These may include:  · Vomiting.  · Confusion and impaired memory.  · Slowed breathing.  · Seizures.  · Loss of consciousness.  DIAGNOSIS   Your health care provider will take a medical history and perform a physical exam. You will be asked about the amount and type of alcohol you have consumed. Blood tests will be done to measure the concentration of alcohol in your blood. In many places, your blood alcohol level must be lower than 80 mg/dL (0.08%) to legally drive. However, many dangerous effects of alcohol can occur at much lower levels.   TREATMENT   People with alcohol intoxication often do not require treatment. Most of the effects of alcohol intoxication are temporary, and they go away as the alcohol naturally  leaves the body. Your health care provider will monitor your condition until you are stable enough to go home. Fluids are sometimes given through an IV access tube to help prevent dehydration.   HOME CARE INSTRUCTIONS  · Do not drive after drinking alcohol.  · Stay hydrated. Drink enough water and fluids to keep your urine clear or pale yellow. Avoid caffeine.    · Only take over-the-counter or prescription medicines as directed by your health care provider.    SEEK MEDICAL CARE IF:   · You have persistent vomiting.    · You do not feel better after a few days.  · You have frequent alcohol intoxication. Your health care provider can help determine if you should see a substance use treatment counselor.  SEEK IMMEDIATE MEDICAL CARE IF:   · You become shaky or tremble when you try to stop drinking.    · You shake uncontrollably (seizure).    · You throw up (vomit) blood. This may be bright red or may look like black coffee grounds.    · You have blood in your stool. This may be bright red or may appear as a black, tarry, bad smelling stool.    · You become lightheaded or faint.    MAKE SURE YOU:   · Understand these instructions.  · Will watch your condition.  · Will get help right away if you are not doing well or get worse.  Document Released: 07/14/2005 Document Revised: 06/06/2013 Document Reviewed: 03/09/2013  [employer]® Patient Information ©2015 [employer]. This information is not intended to replace advice given to you by your health care provider. Make sure   you discuss any questions you have with your health care provider.
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
## 2014-12-28 LAB — URINE CULTURE
Colony Count: NO GROWTH
Culture: NO GROWTH
Special Requests: NORMAL

## 2015-04-02 DIAGNOSIS — M25511 Pain in right shoulder: Secondary | ICD-10-CM | POA: Diagnosis not present

## 2015-04-02 DIAGNOSIS — I10 Essential (primary) hypertension: Secondary | ICD-10-CM | POA: Diagnosis not present

## 2015-04-02 DIAGNOSIS — Z8679 Personal history of other diseases of the circulatory system: Secondary | ICD-10-CM | POA: Diagnosis not present

## 2015-04-02 DIAGNOSIS — Z720 Tobacco use: Secondary | ICD-10-CM | POA: Diagnosis not present

## 2015-04-16 DIAGNOSIS — J449 Chronic obstructive pulmonary disease, unspecified: Secondary | ICD-10-CM | POA: Diagnosis not present

## 2015-05-16 DIAGNOSIS — J449 Chronic obstructive pulmonary disease, unspecified: Secondary | ICD-10-CM | POA: Diagnosis not present

## 2015-06-16 DIAGNOSIS — J449 Chronic obstructive pulmonary disease, unspecified: Secondary | ICD-10-CM | POA: Diagnosis not present

## 2015-06-27 ENCOUNTER — Other Ambulatory Visit (HOSPITAL_COMMUNITY): Payer: Self-pay | Admitting: Internal Medicine

## 2015-06-27 DIAGNOSIS — Z0000 Encounter for general adult medical examination without abnormal findings: Secondary | ICD-10-CM | POA: Diagnosis not present

## 2015-06-27 DIAGNOSIS — R569 Unspecified convulsions: Secondary | ICD-10-CM | POA: Diagnosis not present

## 2015-06-27 DIAGNOSIS — F1020 Alcohol dependence, uncomplicated: Secondary | ICD-10-CM | POA: Diagnosis not present

## 2015-06-27 DIAGNOSIS — I10 Essential (primary) hypertension: Secondary | ICD-10-CM | POA: Diagnosis not present

## 2015-06-27 DIAGNOSIS — Z1231 Encounter for screening mammogram for malignant neoplasm of breast: Secondary | ICD-10-CM

## 2015-06-27 DIAGNOSIS — G8191 Hemiplegia, unspecified affecting right dominant side: Secondary | ICD-10-CM | POA: Diagnosis not present

## 2015-07-02 DIAGNOSIS — M25511 Pain in right shoulder: Secondary | ICD-10-CM | POA: Diagnosis not present

## 2015-07-02 DIAGNOSIS — M25561 Pain in right knee: Secondary | ICD-10-CM | POA: Diagnosis not present

## 2015-07-02 DIAGNOSIS — Z8679 Personal history of other diseases of the circulatory system: Secondary | ICD-10-CM | POA: Diagnosis not present

## 2015-07-02 DIAGNOSIS — I10 Essential (primary) hypertension: Secondary | ICD-10-CM | POA: Diagnosis not present

## 2015-07-09 DIAGNOSIS — M6281 Muscle weakness (generalized): Secondary | ICD-10-CM | POA: Diagnosis not present

## 2015-07-09 DIAGNOSIS — M25561 Pain in right knee: Secondary | ICD-10-CM | POA: Diagnosis not present

## 2015-07-09 DIAGNOSIS — M25511 Pain in right shoulder: Secondary | ICD-10-CM | POA: Diagnosis not present

## 2015-07-09 DIAGNOSIS — M25611 Stiffness of right shoulder, not elsewhere classified: Secondary | ICD-10-CM | POA: Diagnosis not present

## 2015-07-17 DIAGNOSIS — J449 Chronic obstructive pulmonary disease, unspecified: Secondary | ICD-10-CM | POA: Diagnosis not present

## 2015-07-21 DIAGNOSIS — M25611 Stiffness of right shoulder, not elsewhere classified: Secondary | ICD-10-CM | POA: Diagnosis not present

## 2015-07-21 DIAGNOSIS — M25561 Pain in right knee: Secondary | ICD-10-CM | POA: Diagnosis not present

## 2015-07-21 DIAGNOSIS — M25511 Pain in right shoulder: Secondary | ICD-10-CM | POA: Diagnosis not present

## 2015-07-21 DIAGNOSIS — M6281 Muscle weakness (generalized): Secondary | ICD-10-CM | POA: Diagnosis not present

## 2015-07-28 DIAGNOSIS — M6281 Muscle weakness (generalized): Secondary | ICD-10-CM | POA: Diagnosis not present

## 2015-07-28 DIAGNOSIS — M25611 Stiffness of right shoulder, not elsewhere classified: Secondary | ICD-10-CM | POA: Diagnosis not present

## 2015-07-28 DIAGNOSIS — M25511 Pain in right shoulder: Secondary | ICD-10-CM | POA: Diagnosis not present

## 2015-07-28 DIAGNOSIS — M25561 Pain in right knee: Secondary | ICD-10-CM | POA: Diagnosis not present

## 2015-07-31 DIAGNOSIS — M25511 Pain in right shoulder: Secondary | ICD-10-CM | POA: Diagnosis not present

## 2015-07-31 DIAGNOSIS — M25611 Stiffness of right shoulder, not elsewhere classified: Secondary | ICD-10-CM | POA: Diagnosis not present

## 2015-07-31 DIAGNOSIS — M25561 Pain in right knee: Secondary | ICD-10-CM | POA: Diagnosis not present

## 2015-07-31 DIAGNOSIS — M6281 Muscle weakness (generalized): Secondary | ICD-10-CM | POA: Diagnosis not present

## 2015-08-16 DIAGNOSIS — J449 Chronic obstructive pulmonary disease, unspecified: Secondary | ICD-10-CM | POA: Diagnosis not present

## 2015-09-16 DIAGNOSIS — J449 Chronic obstructive pulmonary disease, unspecified: Secondary | ICD-10-CM | POA: Diagnosis not present

## 2015-10-01 DIAGNOSIS — Z720 Tobacco use: Secondary | ICD-10-CM | POA: Diagnosis not present

## 2015-10-01 DIAGNOSIS — M25511 Pain in right shoulder: Secondary | ICD-10-CM | POA: Diagnosis not present

## 2015-10-01 DIAGNOSIS — Z8679 Personal history of other diseases of the circulatory system: Secondary | ICD-10-CM | POA: Diagnosis not present

## 2015-10-01 DIAGNOSIS — I10 Essential (primary) hypertension: Secondary | ICD-10-CM | POA: Diagnosis not present

## 2015-10-06 ENCOUNTER — Ambulatory Visit (HOSPITAL_COMMUNITY)
Admission: RE | Admit: 2015-10-06 | Discharge: 2015-10-06 | Disposition: A | Discharge status: Home / Self Care | Payer: Medicare Other | Source: Ambulatory Visit | Attending: Internal Medicine | Admitting: Internal Medicine

## 2015-10-06 ENCOUNTER — Other Ambulatory Visit (HOSPITAL_COMMUNITY): Payer: Self-pay | Admitting: Internal Medicine

## 2015-10-06 DIAGNOSIS — Z1231 Encounter for screening mammogram for malignant neoplasm of breast: Secondary | ICD-10-CM

## 2015-10-07 DIAGNOSIS — R569 Unspecified convulsions: Secondary | ICD-10-CM | POA: Diagnosis not present

## 2015-10-07 DIAGNOSIS — F1010 Alcohol abuse, uncomplicated: Secondary | ICD-10-CM | POA: Diagnosis not present

## 2015-10-07 DIAGNOSIS — G8112 Spastic hemiplegia affecting left dominant side: Secondary | ICD-10-CM | POA: Diagnosis not present

## 2015-10-07 DIAGNOSIS — Z0000 Encounter for general adult medical examination without abnormal findings: Secondary | ICD-10-CM | POA: Diagnosis not present

## 2015-10-07 DIAGNOSIS — Z23 Encounter for immunization: Secondary | ICD-10-CM | POA: Diagnosis not present

## 2015-10-07 DIAGNOSIS — I10 Essential (primary) hypertension: Secondary | ICD-10-CM | POA: Diagnosis not present

## 2015-10-14 ENCOUNTER — Observation Stay (HOSPITAL_COMMUNITY)
Admission: EM | Admit: 2015-10-14 | Discharge: 2015-10-17 | Disposition: A | Discharge status: Home / Self Care | Payer: Medicare Other | Attending: Internal Medicine | Admitting: Internal Medicine

## 2015-10-14 ENCOUNTER — Encounter (HOSPITAL_COMMUNITY): Payer: Self-pay | Admitting: *Deleted

## 2015-10-14 ENCOUNTER — Emergency Department (HOSPITAL_COMMUNITY): Payer: Medicare Other

## 2015-10-14 DIAGNOSIS — R945 Abnormal results of liver function studies: Secondary | ICD-10-CM | POA: Diagnosis not present

## 2015-10-14 DIAGNOSIS — R1084 Generalized abdominal pain: Secondary | ICD-10-CM | POA: Insufficient documentation

## 2015-10-14 DIAGNOSIS — K209 Esophagitis, unspecified: Secondary | ICD-10-CM | POA: Insufficient documentation

## 2015-10-14 DIAGNOSIS — E876 Hypokalemia: Secondary | ICD-10-CM | POA: Diagnosis present

## 2015-10-14 DIAGNOSIS — K219 Gastro-esophageal reflux disease without esophagitis: Secondary | ICD-10-CM | POA: Diagnosis present

## 2015-10-14 DIAGNOSIS — R4182 Altered mental status, unspecified: Secondary | ICD-10-CM | POA: Diagnosis not present

## 2015-10-14 DIAGNOSIS — D696 Thrombocytopenia, unspecified: Secondary | ICD-10-CM | POA: Diagnosis not present

## 2015-10-14 DIAGNOSIS — K920 Hematemesis: Principal | ICD-10-CM | POA: Diagnosis present

## 2015-10-14 DIAGNOSIS — R000 Tachycardia, unspecified: Secondary | ICD-10-CM | POA: Diagnosis present

## 2015-10-14 DIAGNOSIS — I10 Essential (primary) hypertension: Secondary | ICD-10-CM | POA: Diagnosis not present

## 2015-10-14 DIAGNOSIS — F1010 Alcohol abuse, uncomplicated: Secondary | ICD-10-CM | POA: Diagnosis present

## 2015-10-14 DIAGNOSIS — K922 Gastrointestinal hemorrhage, unspecified: Secondary | ICD-10-CM | POA: Diagnosis present

## 2015-10-14 DIAGNOSIS — Z8673 Personal history of transient ischemic attack (TIA), and cerebral infarction without residual deficits: Secondary | ICD-10-CM | POA: Diagnosis not present

## 2015-10-14 DIAGNOSIS — K2970 Gastritis, unspecified, without bleeding: Secondary | ICD-10-CM | POA: Diagnosis not present

## 2015-10-14 DIAGNOSIS — R7989 Other specified abnormal findings of blood chemistry: Secondary | ICD-10-CM

## 2015-10-14 DIAGNOSIS — K210 Gastro-esophageal reflux disease with esophagitis: Secondary | ICD-10-CM | POA: Diagnosis not present

## 2015-10-14 DIAGNOSIS — R112 Nausea with vomiting, unspecified: Secondary | ICD-10-CM | POA: Diagnosis not present

## 2015-10-14 DIAGNOSIS — G40909 Epilepsy, unspecified, not intractable, without status epilepticus: Secondary | ICD-10-CM | POA: Diagnosis not present

## 2015-10-14 DIAGNOSIS — R110 Nausea: Secondary | ICD-10-CM | POA: Diagnosis not present

## 2015-10-14 LAB — OCCULT BLOOD GASTRIC / DUODENUM (SPECIMEN CUP)
Occult Blood, Gastric: POSITIVE — AB
pH, Gastric: 4

## 2015-10-14 LAB — I-STAT CHEM 8, ED
BUN: 8 mg/dL (ref 6–20)
Calcium, Ion: 0.93 mmol/L — ABNORMAL LOW (ref 1.12–1.23)
Chloride: 81 mmol/L — ABNORMAL LOW (ref 101–111)
Creatinine, Ser: 0.8 mg/dL (ref 0.44–1.00)
Glucose, Bld: 129 mg/dL — ABNORMAL HIGH (ref 65–99)
HCT: 51 % — ABNORMAL HIGH (ref 36.0–46.0)
Hemoglobin: 17.3 g/dL — ABNORMAL HIGH (ref 12.0–15.0)
Potassium: 2.2 mmol/L — CL (ref 3.5–5.1)
Sodium: 137 mmol/L (ref 135–145)
TCO2: 36 mmol/L (ref 0–100)

## 2015-10-14 LAB — MAGNESIUM: Magnesium: 1.8 mg/dL (ref 1.7–2.4)

## 2015-10-14 LAB — RAPID URINE DRUG SCREEN, HOSP PERFORMED
Amphetamines: NOT DETECTED
Barbiturates: POSITIVE — AB
Benzodiazepines: NOT DETECTED
Cocaine: NOT DETECTED
Opiates: NOT DETECTED
Tetrahydrocannabinol: NOT DETECTED

## 2015-10-14 LAB — CBC
HCT: 35.2 % — ABNORMAL LOW (ref 36.0–46.0)
HCT: 30.3 % — ABNORMAL LOW (ref 36.0–46.0)
Hemoglobin: 12.2 g/dL (ref 12.0–15.0)
Hemoglobin: 10.6 g/dL — ABNORMAL LOW (ref 12.0–15.0)
MCH: 31.6 pg (ref 26.0–34.0)
MCH: 32 pg (ref 26.0–34.0)
MCHC: 34.7 g/dL (ref 30.0–36.0)
MCHC: 35 g/dL (ref 30.0–36.0)
MCV: 91.2 fL (ref 78.0–100.0)
MCV: 91.5 fL (ref 78.0–100.0)
Platelets: 145 10*3/uL — ABNORMAL LOW (ref 150–400)
Platelets: 136 10*3/uL — ABNORMAL LOW (ref 150–400)
RBC: 3.86 MIL/uL — ABNORMAL LOW (ref 3.87–5.11)
RBC: 3.31 MIL/uL — ABNORMAL LOW (ref 3.87–5.11)
RDW: 13.2 % (ref 11.5–15.5)
RDW: 13.2 % (ref 11.5–15.5)
WBC: 9.2 10*3/uL (ref 4.0–10.5)
WBC: 7 10*3/uL (ref 4.0–10.5)

## 2015-10-14 LAB — COMPREHENSIVE METABOLIC PANEL
ALT: 43 U/L (ref 14–54)
AST: 142 U/L — ABNORMAL HIGH (ref 15–41)
Albumin: 4 g/dL (ref 3.5–5.0)
Alkaline Phosphatase: 78 U/L (ref 38–126)
BUN: 10 mg/dL (ref 6–20)
CO2: 34 mmol/L — ABNORMAL HIGH (ref 22–32)
Calcium: 8.7 mg/dL — ABNORMAL LOW (ref 8.9–10.3)
Chloride: 80 mmol/L — ABNORMAL LOW (ref 101–111)
Creatinine, Ser: 0.6 mg/dL (ref 0.44–1.00)
GFR calc Af Amer: >60 mL/min (ref >60)
GFR calc non Af Amer: >60 mL/min (ref >60)
Glucose, Bld: 135 mg/dL — ABNORMAL HIGH (ref 65–99)
Potassium: <2 mmol/L — CL (ref 3.5–5.1)
Sodium: 135 mmol/L (ref 135–145)
Total Bilirubin: 1.2 mg/dL (ref 0.3–1.2)
Total Protein: 8.2 g/dL — ABNORMAL HIGH (ref 6.5–8.1)

## 2015-10-14 LAB — CBC WITH DIFFERENTIAL/PLATELET
Basophils Absolute: 0 10*3/uL (ref 0.0–0.1)
Basophils Relative: 0 %
Eosinophils Absolute: 0 10*3/uL (ref 0.0–0.7)
Eosinophils Relative: 0 %
HCT: 39.4 % (ref 36.0–46.0)
Hemoglobin: 13.4 g/dL (ref 12.0–15.0)
Lymphocytes Relative: 12 %
Lymphs Abs: 0.9 10*3/uL (ref 0.7–4.0)
MCH: 31.1 pg (ref 26.0–34.0)
MCHC: 34 g/dL (ref 30.0–36.0)
MCV: 91.4 fL (ref 78.0–100.0)
Monocytes Absolute: 0.6 10*3/uL (ref 0.1–1.0)
Monocytes Relative: 7 %
Neutro Abs: 6.1 10*3/uL (ref 1.7–7.7)
Neutrophils Relative %: 81 %
Platelets: 175 10*3/uL (ref 150–400)
RBC: 4.31 MIL/uL (ref 3.87–5.11)
RDW: 13.1 % (ref 11.5–15.5)
WBC: 7.6 10*3/uL (ref 4.0–10.5)

## 2015-10-14 LAB — URINE MICROSCOPIC-ADD ON
Bacteria, UA: NONE SEEN
WBC, UA: NONE SEEN WBC/hpf (ref 0–5)

## 2015-10-14 LAB — URINALYSIS, ROUTINE W REFLEX MICROSCOPIC
Bilirubin Urine: NEGATIVE
Glucose, UA: NEGATIVE mg/dL
Ketones, ur: 15 mg/dL — AB
Leukocytes, UA: NEGATIVE
Nitrite: NEGATIVE
Protein, ur: NEGATIVE mg/dL
Specific Gravity, Urine: 1.01 (ref 1.005–1.030)
pH: 6.5 (ref 5.0–8.0)

## 2015-10-14 LAB — APTT: aPTT: 28 seconds (ref 24–37)

## 2015-10-14 LAB — TYPE AND SCREEN
ABO/RH(D): O POS
Antibody Screen: NEGATIVE

## 2015-10-14 LAB — PROTIME-INR
INR: 1.14 (ref 0.00–1.49)
INR: 1.24 (ref 0.00–1.49)
Prothrombin Time: 14.8 seconds (ref 11.6–15.2)
Prothrombin Time: 15.8 seconds — ABNORMAL HIGH (ref 11.6–15.2)

## 2015-10-14 LAB — ETHANOL: Alcohol, Ethyl (B): 166 mg/dL — ABNORMAL HIGH (ref <5)

## 2015-10-14 LAB — MRSA PCR SCREENING: MRSA by PCR: NEGATIVE

## 2015-10-14 LAB — PHENOBARBITAL LEVEL: Phenobarbital: <5 ug/mL — ABNORMAL LOW (ref 15.0–40.0)

## 2015-10-14 LAB — PHENYTOIN LEVEL, TOTAL: Phenytoin Lvl: <2.5 ug/mL — ABNORMAL LOW (ref 10.0–20.0)

## 2015-10-14 MED ORDER — LORAZEPAM 2 MG/ML IJ SOLN
0.0000 mg | Freq: Four times a day (QID) | INTRAMUSCULAR | Status: DC
Start: 2015-10-14 — End: 2015-10-14
  Administered 2015-10-14: 1 mg via INTRAVENOUS
  Filled 2015-10-14: qty 1

## 2015-10-14 MED ORDER — LORAZEPAM 2 MG/ML IJ SOLN: 0.0000 mg | Freq: Four times a day (QID) | INTRAMUSCULAR | Status: AC

## 2015-10-14 MED ORDER — ONDANSETRON HCL 4 MG PO TABS: 4.0000 mg | ORAL_TABLET | Freq: Four times a day (QID) | ORAL | Status: DC | PRN

## 2015-10-14 MED ORDER — ONDANSETRON HCL 4 MG/2ML IJ SOLN: 4.0000 mg | Freq: Once | INTRAMUSCULAR | Status: DC

## 2015-10-14 MED ORDER — ONDANSETRON HCL 4 MG/2ML IJ SOLN
4.0000 mg | Freq: Four times a day (QID) | INTRAMUSCULAR | Status: DC | PRN
  Administered 2015-10-14: 4 mg via INTRAVENOUS
  Filled 2015-10-14: qty 2

## 2015-10-14 MED ORDER — ONDANSETRON HCL 4 MG/2ML IJ SOLN
INTRAMUSCULAR | Status: AC
Start: 2015-10-14 — End: 2015-10-14
  Administered 2015-10-14: 4 mg
  Filled 2015-10-14: qty 2

## 2015-10-14 MED ORDER — SODIUM CHLORIDE 0.9 % IV SOLN
INTRAVENOUS | Status: AC
  Administered 2015-10-14: 08:00:00 via INTRAVENOUS

## 2015-10-14 MED ORDER — LORAZEPAM 2 MG/ML IJ SOLN: 1.0000 mg | Freq: Four times a day (QID) | INTRAMUSCULAR | Status: AC | PRN

## 2015-10-14 MED ORDER — FOLIC ACID 1 MG PO TABS
1.0000 mg | ORAL_TABLET | Freq: Every day | ORAL | Status: DC
  Administered 2015-10-14 – 2015-10-17 (×4): 1 mg via ORAL
  Filled 2015-10-14 (×4): qty 1

## 2015-10-14 MED ORDER — LORAZEPAM 2 MG/ML IJ SOLN: 0.0000 mg | Freq: Two times a day (BID) | INTRAMUSCULAR | Status: DC

## 2015-10-14 MED ORDER — AMLODIPINE BESYLATE 5 MG PO TABS
5.0000 mg | ORAL_TABLET | Freq: Every day | ORAL | Status: DC
  Administered 2015-10-14 – 2015-10-17 (×4): 5 mg via ORAL
  Filled 2015-10-14 (×4): qty 1

## 2015-10-14 MED ORDER — VITAMIN B-1 100 MG PO TABS
100.0000 mg | ORAL_TABLET | Freq: Every day | ORAL | Status: DC
  Administered 2015-10-14 – 2015-10-16 (×3): 100 mg via ORAL
  Filled 2015-10-14 (×3): qty 1

## 2015-10-14 MED ORDER — POTASSIUM CHLORIDE 10 MEQ/100ML IV SOLN
10.0000 meq | INTRAVENOUS | Status: AC
  Administered 2015-10-14 (×6): 10 meq via INTRAVENOUS
  Filled 2015-10-14 (×3): qty 100

## 2015-10-14 MED ORDER — ONDANSETRON HCL 4 MG/2ML IJ SOLN
4.0000 mg | Freq: Three times a day (TID) | INTRAMUSCULAR | Status: DC
  Administered 2015-10-14 – 2015-10-17 (×11): 4 mg via INTRAVENOUS
  Filled 2015-10-14 (×11): qty 2

## 2015-10-14 MED ORDER — LORAZEPAM 0.5 MG PO TABS: 1.0000 mg | ORAL_TABLET | Freq: Four times a day (QID) | ORAL | Status: AC | PRN

## 2015-10-14 MED ORDER — ACETAMINOPHEN 650 MG RE SUPP: 650.0000 mg | Freq: Four times a day (QID) | RECTAL | Status: DC | PRN

## 2015-10-14 MED ORDER — THIAMINE HCL 100 MG/ML IJ SOLN
100.0000 mg | Freq: Every day | INTRAMUSCULAR | Status: DC
  Administered 2015-10-17: 100 mg via INTRAVENOUS

## 2015-10-14 MED ORDER — PANTOPRAZOLE SODIUM 40 MG IV SOLR
INTRAVENOUS | Status: AC
  Filled 2015-10-14: qty 160

## 2015-10-14 MED ORDER — SODIUM CHLORIDE 0.9 % IV SOLN
8.0000 mg/h | INTRAVENOUS | Status: DC
  Administered 2015-10-14 – 2015-10-15 (×4): 8 mg/h via INTRAVENOUS
  Filled 2015-10-14 (×6): qty 80

## 2015-10-14 MED ORDER — PHENOBARBITAL 30 MG PO TABS: 30.0000 mg | ORAL_TABLET | Freq: Two times a day (BID) | ORAL | Status: DC

## 2015-10-14 MED ORDER — PHENOBARBITAL 32.4 MG PO TABS
32.4000 mg | ORAL_TABLET | Freq: Two times a day (BID) | ORAL | Status: DC
  Administered 2015-10-14 – 2015-10-17 (×7): 32.4 mg via ORAL
  Filled 2015-10-14 (×7): qty 1

## 2015-10-14 MED ORDER — SODIUM CHLORIDE 0.9 % IV SOLN
80.0000 mg | Freq: Once | INTRAVENOUS | Status: AC
  Administered 2015-10-14: 80 mg via INTRAVENOUS
  Filled 2015-10-14: qty 80

## 2015-10-14 MED ORDER — LORAZEPAM 2 MG/ML IJ SOLN
0.0000 mg | Freq: Two times a day (BID) | INTRAMUSCULAR | Status: DC
Start: 2015-10-16 — End: 2015-10-14

## 2015-10-14 MED ORDER — ACETAMINOPHEN 325 MG PO TABS: 650.0000 mg | ORAL_TABLET | Freq: Four times a day (QID) | ORAL | Status: DC | PRN

## 2015-10-14 MED ORDER — ADULT MULTIVITAMIN W/MINERALS CH
1.0000 | ORAL_TABLET | Freq: Every day | ORAL | Status: DC
  Administered 2015-10-14 – 2015-10-17 (×4): 1 via ORAL
  Filled 2015-10-14 (×4): qty 1

## 2015-10-14 MED ORDER — SODIUM CHLORIDE 0.9 % IV BOLUS (SEPSIS)
1000.0000 mL | Freq: Once | INTRAVENOUS | Status: AC
  Administered 2015-10-14: 1000 mL via INTRAVENOUS

## 2015-10-14 MED ORDER — LORAZEPAM 2 MG/ML IJ SOLN
0.0000 mg | Freq: Two times a day (BID) | INTRAMUSCULAR | Status: DC
  Administered 2015-10-14: 1 mg via INTRAVENOUS
  Filled 2015-10-14: qty 1

## 2015-10-14 MED ORDER — MAGNESIUM SULFATE IN D5W 10-5 MG/ML-% IV SOLN
1.0000 g | Freq: Once | INTRAVENOUS | Status: AC
  Administered 2015-10-14: 1 g via INTRAVENOUS
  Filled 2015-10-14: qty 100

## 2015-10-14 MED ORDER — THIAMINE HCL 100 MG/ML IJ SOLN
100.0000 mg | Freq: Every day | INTRAMUSCULAR | Status: DC
  Filled 2015-10-14: qty 2

## 2015-10-14 MED ORDER — PHENYTOIN SODIUM EXTENDED 100 MG PO CAPS
100.0000 mg | ORAL_CAPSULE | Freq: Three times a day (TID) | ORAL | Status: DC
  Administered 2015-10-14 – 2015-10-17 (×10): 100 mg via ORAL
  Filled 2015-10-14 (×10): qty 1

## 2015-10-14 MED ORDER — PANTOPRAZOLE SODIUM 40 MG IV SOLR
40.0000 mg | Freq: Two times a day (BID) | INTRAVENOUS | Status: DC
Start: 2015-10-17 — End: 2015-10-15

## 2015-10-14 MED ORDER — LORAZEPAM 2 MG/ML IJ SOLN: 0.0000 mg | Freq: Four times a day (QID) | INTRAMUSCULAR | Status: DC

## 2015-10-14 MED ORDER — POTASSIUM CHLORIDE IN NACL 40-0.9 MEQ/L-% IV SOLN
INTRAVENOUS | Status: DC
  Administered 2015-10-14 – 2015-10-15 (×3): 125 mL/h via INTRAVENOUS
  Administered 2015-10-15: 50 mL/h via INTRAVENOUS
  Administered 2015-10-15: 125 mL/h via INTRAVENOUS

## 2015-10-14 MED ORDER — DARIFENACIN HYDROBROMIDE ER 7.5 MG PO TB24
7.5000 mg | ORAL_TABLET | Freq: Every day | ORAL | Status: DC
Start: 2015-10-14 — End: 2015-10-17
  Administered 2015-10-14 – 2015-10-17 (×4): 7.5 mg via ORAL
  Filled 2015-10-14 (×4): qty 1

## 2015-10-14 NOTE — ED Notes (Signed)
CRITICAL VALUE ALERT  Critical value received: Potassium 1.9  Date of notification:  10/14/2015  Time of notification:  0614  Critical value read back: yes  Nurse who received alert:  Natividad Brood  MD notified (1st page):  Dr.Ward  Time of first page:    MD notified (2nd page):  Time of second page:  Responding MD:  Dr. Leonides Schanz  Time MD responded:  [phone]

## 2015-10-14 NOTE — ED Notes (Signed)
Pt c/o vomiting x 1 day; pt states she is vomiting dark red blood and c/o generalized abdominal pain

## 2015-10-14 NOTE — Progress Notes (Signed)
Patient vomited large amount of brown emesis on bed sheets.

## 2015-10-14 NOTE — H&P (Signed)
[facility] History and Physical  [name] [id number] DOB: 1956-05-13 DOA: 10/14/2015  Referring physician: Delice Bison. Ward, DO PCP: [name], MD   Chief Complaint: Abdominal pain, hematemesis  HPI: [name] is a 59 y.o. female history of seizures on Dilantin and phenobarbital, hypertension, peptic ulcer disease, esophagitis, alcohol abuse who presents to the ED with complaints of hematemesis onset last night. Reports vomiting 4x. Blood was noted after multiple episodes. Admits chest pain secondary to deep breath, generalized abdominal pain, and alcohol use (last drink 12/26). Denies hematochezia, fever, lightheaded, dizziness, NSAID use. While in the ED potassium noted to be <2.0, EtOH 166, and report coffee-ground emesis. Admitted for further evaluation and monitoring.   Review of Systems:  Constitutional:  No weight loss, night sweats, Fevers, chills, fatigue.  HEENT:  No headaches, Difficulty swallowing,Tooth/dental problems,Sore throat,  No sneezing, itching, ear ache, nasal congestion, post nasal drip,  Cardio-vascular:  No  Orthopnea, PND, swelling in lower extremities, anasarca, dizziness, palpitations  Positive: Chest pain GI:  No heartburn, indigestion,  nausea,  diarrhea, change in bowel habits, loss of appetite  Positive: vomiting, abdominal pain Resp:  No shortness of breath with exertion or at rest. No excess mucus, no productive cough, No non-productive cough, No coughing up of blood.No change in color of mucus.No wheezing.No chest wall deformity  Skin:  no rash or lesions.  GU:  no dysuria, change in color of urine, no urgency or frequency. No flank pain.  Musculoskeletal:  No joint pain or swelling. No decreased range of motion. No back pain.  Psych:  No change in mood or affect. No depression or anxiety. No memory loss.   Past Medical History  Diagnosis Date  . Stroke [facility])     age 25, required brain surgery  . Seizures ([place])       since stroke, no recent seizures  . HTN (hypertension)   . PUD (peptic ulcer disease)     remote  . Shortness of breath     with exertion  . Contracture of muscle of hand     right  . Alcohol abuse   . Chronic abdominal pain   . Chronic diarrhea    Past Surgical History  Procedure Laterality Date  . Abdominal hysterectomy  2004    complete  . Right tib-fib fracture  2008  . Brain surgery      age 65, stroke  . Colonoscopy with propofol  10/03/2012    Procedure: COLONOSCOPY WITH PROPOFOL;  Surgeon: Danie Binder, MD;  Location: [facility] ORS;  Service: Endoscopy;  Laterality: N/A;  1230 in cecum, total withdrawel time 18 minutes, done at 1248  . Esophagogastroduodenoscopy (egd) with propofol  10/03/2012    Procedure: ESOPHAGOGASTRODUODENOSCOPY (EGD) WITH PROPOFOL;  Surgeon: Danie Binder, MD;  Location: [facility] ORS;  Service: Endoscopy;  Laterality: N/A;  started at 1254  . Savory dilation  10/03/2012    Procedure: SAVORY DILATION;  Surgeon: Danie Binder, MD;  Location: [facility] ORS;  Service: Endoscopy;  Laterality: N/A;  started at 1303,  dilated 12.8-16mm  . Esophagogastroduodenoscopy N/A 10/18/2014    Procedure: ESOPHAGOGASTRODUODENOSCOPY (EGD);  Surgeon: Daneil Dolin, MD;  Location: [facility] ENDO SUITE;  Service: Endoscopy;  Laterality: N/A;  Bedside ICU procedure. Conscious sedation. Hematemesis.   Social History:  reports that she quit smoking about 14 months ago. Her smoking use included Cigarettes. She has a .36 pack-year smoking history. She has never used smokeless tobacco. She reports that she drinks alcohol.  She reports that she does not use illicit drugs.  Allergies  Allergen Reactions  . Other     Unable to recall med    Family History  Problem Relation Age of Onset  . Colon cancer Neg Hx   . Liver disease Sister     etoh     Prior to Admission medications   Medication Sig Start Date End Date Taking? Authorizing Provider  lisinopril-hydrochlorothiazide (PRINZIDE,ZESTORETIC)  20-12.5 MG per tablet Take 1 tablet by mouth daily.   Yes Historical Provider, MD  omeprazole (PRILOSEC) 20 MG capsule Take 20 mg by mouth daily.   Yes Historical Provider, MD  phenytoin (DILANTIN) 100 MG ER capsule Take 100 mg by mouth 3 (three) times daily.   Yes Historical Provider, MD  solifenacin (VESICARE) 5 MG tablet Take 5 mg by mouth daily.   Yes Historical Provider, MD  albuterol (PROVENTIL HFA;VENTOLIN HFA) 108 (90 BASE) MCG/ACT inhaler Inhale 2 puffs into the lungs every 6 (six) hours as needed. FOR SHORTNESS OF BREATH AND/OR WHEEZING    Historical Provider, MD  amLODipine (NORVASC) 5 MG tablet Take 5 mg by mouth daily.    Historical Provider, MD  ipratropium-albuterol (DUONEB) 0.5-2.5 (3) MG/3ML SOLN Take 3 mLs by nebulization every 6 (six) hours as needed (for shortness of breath/wheezing).     Historical Provider, MD  pantoprazole (PROTONIX) 40 MG tablet Take 1 tablet (40 mg total) by mouth 2 (two) times daily. 10/20/14   [name], MD  PHENObarbital (LUMINAL) 30 MG tablet Take 30 mg by mouth 2 (two) times daily.    Historical Provider, MD  sucralfate (CARAFATE) 1 GM/10ML suspension Take 10 mLs (1 g total) by mouth 4 (four) times daily -  with meals and at bedtime. Patient not taking: Reported on 12/26/2014 10/20/14   [name], MD  thiamine 100 MG tablet Take 1 tablet (100 mg total) by mouth daily. Patient not taking: Reported on 12/26/2014 10/20/14   [name], MD   Physical Exam: Filed Vitals:   10/14/15 0420 10/14/15 0600 10/14/15 0630  BP: 121/73 164/82 151/61  Pulse: 109 103   Temp: 98 F (36.7 C)    TempSrc: Oral    Resp: 20 20 24   Height: 5\' 4"  (1.626 m)    Weight: 67.132 kg (148 lb)    SpO2: 98% 99%     Wt Readings from Last 3 Encounters:  10/14/15 67.132 kg (148 lb)  10/20/14 60.691 kg (133 lb 12.8 oz)  10/10/14 61.689 kg (136 lb)    General:  Appears calm and comfortable Eyes: PERRL, normal lids, irises & conjunctiva ENT: dry mucous membrane  Neck:  no LAD, masses or thyromegaly Cardiovascular: Tachycardiac  Telemetry: ST   Respiratory: CTA bilaterally, no w/r/r. Normal respiratory effort. Abdomen: Mild generalized tenderness, positive bowel sounds.  Skin: no rash or induration seen on limited exam Musculoskeletal: grossly normal tone BUE/BLE Psychiatric: grossly normal mood and affect, speech fluent and appropriate Neurologic: grossly non-focal.          Labs on Admission:  Basic Metabolic Panel:  Recent Labs Lab 10/14/15 0525 10/14/15 0530 10/14/15 0632  NA  --  135 137  K  --  <2.0* 2.2*  CL  --  80* 81*  CO2  --  34*  --   GLUCOSE  --  135* 129*  BUN  --  10 8  CREATININE  --  0.60 0.80  CALCIUM  --  8.7*  --   MG 1.8  --   --  Liver Function Tests:  Recent Labs Lab 10/14/15 0530  AST 142*  ALT 43  ALKPHOS 78  BILITOT 1.2  PROT 8.2*  ALBUMIN 4.0  CBC:  Recent Labs Lab 10/14/15 0530 10/14/15 0632  WBC 7.6  --   NEUTROABS 6.1  --   HGB 13.4 17.3*  HCT 39.4 51.0*  MCV 91.4  --   PLT 175  --    Radiological Exams on Admission: Dg Abd Acute W/chest  10/14/2015  CLINICAL DATA:  Acute onset of altered mental status and hematemesis. Generalized abdominal pain. Initial encounter. EXAM: DG ABDOMEN ACUTE W/ 1V CHEST COMPARISON:  Chest radiograph performed 10/18/2014, and abdominal radiographs performed 01/06/2012 FINDINGS: The lungs are well-aerated and clear. There is no evidence of focal opacification, pleural effusion or pneumothorax. The cardiomediastinal silhouette is within normal limits. The visualized bowel gas pattern is unremarkable. Scattered stool and air are seen within the colon; there is no evidence of small bowel dilatation to suggest obstruction. No free intra-abdominal air is identified on the provided upright view. No acute osseous abnormalities are seen; the sacroiliac joints are unremarkable in appearance. Degenerative change is noted along the lower lumbar spine. IMPRESSION: 1.  Unremarkable bowel gas pattern; no free intra-abdominal air seen. Small to moderate amount of stool noted in the colon. 2. No acute cardiopulmonary process seen. Electronically Signed   By: Garald Balding M.D.   On: 10/14/2015 06:19    EKG: Independently reviewed. ST, N o acute changes/   Assessment/Plan Active Problems:   Hematemesis   1. Hematemesis, Patient noted to have coffee-ground emesis upon admission suspect she has a mallorywise tear. Since hematemesis started after several episode of vomiting. . Last endoscopy with Dr. Gala Romney was 10/18/2014 which showed severe erosive esophagitis. No history of esophageal varices. GI has been consulted. Will be placed NPO except medications. CXR unremarkable. She has been started on Protonix infusion that will be continued. Will check serial CBCs 2. Sinus tachycardia, likely reactive to #1. Continue to monitor.  3. GERD, continue PPI. 4. Seizure disorder, continue home medications. 5. Hypokalemia.  Likely related to [place] uses and repeated vomiting. Improving. Continue to replete.  6. EtOH abuse, Etoh level 166 on admission. Placed on CIWA. Last drink reported 12/26. Will monitor for withdrawal 7. Essential HTN. Will continue Lin hold lisinopril and HCTZ until potassium has been replaced/    Code Status: Full  DVT Prophylaxis: SCDs Family Communication: Discussed with patient who understands and has no concerns at this time. Disposition Plan: Admit to SDU  Time spent: 45 minutes.   Kathie Dike, MD [facility] Pager [phone]   By signing my name below, I, Rennis Harding, attest that this documentation has been prepared under the direction and in the presence of Kathie Dike, MD. Electronically signed: Rennis Harding, Scribe. 10/14/2015 8:30am   I, Dr. Kathie Dike, personally performed the services described in this documentaiton. All medical record entries made by the scribe were at my direction and in my presence. I have  reviewed the chart and agree that the record reflects my personal performance and is accurate and complete  Kathie Dike, MD, 10/14/2015 8:48 AM

## 2015-10-14 NOTE — Consult Note (Signed)
Referring Provider: Dr. Leonides Schanz  Primary Care Physician:  [name], MD Primary Gastroenterologist:  Dr. [name]   Date of Admission: 10/14/15 Date of Consultation: 10/14/15  Reason for Consultation:  Hematemesis   HPI:  [name] is a 59 y.o. year old female well known to our practice with a history of alcohol abuse. Poor historian. Inpatient last year with hematemesis and underwent EGD by Dr. Gala Romney noting severe exudate/erosive reflux esophagitis. She is established with Dr. [name]. She has continued to drink as an outpatient. She tells me that she doesn't drink daily. She is drowsy at time of consultation and difficult to obtain significant history.   Appears she had an acute onset of nausea and vomiting yesterday, with coffee ground emesis after multiple episodes of vomiting. Associated upper abdominal pain, vague reports. No melena. States she takes Advil on a routine basis as outpatient but unable to quantify amount or frequency. Denies any dysphagia. Does states that she has been taking Protonix BID as outpatient.   Admitting Hgb 13.4 and ETOH level 166. Significant hypokalemia noted. No known liver disease, and no stigmata of liver disease on prior EGD approximately 1 year ago.   Past Medical History  Diagnosis Date  . Stroke [facility])     age 51, required brain surgery  . Seizures ([place])     since stroke, no recent seizures  . HTN (hypertension)   . PUD (peptic ulcer disease)     remote  . Shortness of breath     with exertion  . Contracture of muscle of hand     right  . Alcohol abuse   . Chronic abdominal pain   . Chronic diarrhea     Past Surgical History  Procedure Laterality Date  . Abdominal hysterectomy  2004    complete  . Right tib-fib fracture  2008  . Brain surgery      age 28, stroke  . Colonoscopy with propofol  10/03/2012    Dr. [name]: moderate diverticulosis, small internal hemorrhoids, TUBULAR ADENOMA  . Esophagogastroduodenoscopy (egd)  with propofol  10/03/2012    Dr. [name]: stricture at [place] junction s/p dilation, mild gastritis negative H.pylori   . Savory dilation  10/03/2012    Procedure: SAVORY DILATION;  Surgeon: Danie Binder, MD;  Location: [facility];  Service: Endoscopy;  Laterality: N/A;  started at 1303,  dilated 12.8-16mm  . Esophagogastroduodenoscopy N/A 10/18/2014    Dr. Gala Romney during inpatient hospitalization: severe exudative/erosive reflux esophagitis as source of trivial upper GI bleed. No varices     Prior to Admission medications   Medication Sig Start Date End Date Taking? Authorizing Provider  lisinopril-hydrochlorothiazide (PRINZIDE,ZESTORETIC) 20-12.5 MG per tablet Take 1 tablet by mouth daily.   Yes Historical Provider, MD  omeprazole (PRILOSEC) 20 MG capsule Take 20 mg by mouth daily.   Yes Historical Provider, MD  phenytoin (DILANTIN) 100 MG ER capsule Take 100 mg by mouth 3 (three) times daily.   Yes Historical Provider, MD  solifenacin (VESICARE) 5 MG tablet Take 5 mg by mouth daily.   Yes Historical Provider, MD  albuterol (PROVENTIL HFA;VENTOLIN HFA) 108 (90 BASE) MCG/ACT inhaler Inhale 2 puffs into the lungs every 6 (six) hours as needed. FOR SHORTNESS OF BREATH AND/OR WHEEZING    Historical Provider, MD  amLODipine (NORVASC) 5 MG tablet Take 5 mg by mouth daily.    Historical Provider, MD  ipratropium-albuterol (DUONEB) 0.5-2.5 (3) MG/3ML SOLN Take 3 mLs by nebulization every 6 (six) hours as  needed (for shortness of breath/wheezing).     Historical Provider, MD  pantoprazole (PROTONIX) 40 MG tablet Take 1 tablet (40 mg total) by mouth 2 (two) times daily. 10/20/14   [name], MD  PHENObarbital (LUMINAL) 30 MG tablet Take 30 mg by mouth 2 (two) times daily.    Historical Provider, MD  sucralfate (CARAFATE) 1 GM/10ML suspension Take 10 mLs (1 g total) by mouth 4 (four) times daily -  with meals and at bedtime. Patient not taking: Reported on 12/26/2014 10/20/14   [name], MD  thiamine 100 MG  tablet Take 1 tablet (100 mg total) by mouth daily. Patient not taking: Reported on 12/26/2014 10/20/14   [name], MD    Current Facility-Administered Medications  Medication Dose Route Frequency Provider Last Rate Last Dose  . 0.9 %  sodium chloride infusion   Intravenous STAT Kristen N Ward, DO 125 mL/hr at 10/14/15 [id number]    . 0.9 % NaCl with KCl 40 mEq / L  infusion   Intravenous Continuous Kathie Dike, MD      . acetaminophen (TYLENOL) tablet 650 mg  650 mg Oral Q6H PRN Kathie Dike, MD       Or  . acetaminophen (TYLENOL) suppository 650 mg  650 mg Rectal Q6H PRN Kathie Dike, MD      . amLODipine (NORVASC) tablet 5 mg  5 mg Oral Daily Kathie Dike, MD      . darifenacin (ENABLEX) 24 hr tablet 7.5 mg  7.5 mg Oral Daily Kathie Dike, MD      . folic acid (FOLVITE) tablet 1 mg  1 mg Oral Daily Kathie Dike, MD      . LORazepam (ATIVAN) injection 0-4 mg  0-4 mg Intravenous Q6H Kathie Dike, MD       Followed by  . [START ON 10/16/2015] LORazepam (ATIVAN) injection 0-4 mg  0-4 mg Intravenous Q12H Kathie Dike, MD      . LORazepam (ATIVAN) tablet 1 mg  1 mg Oral Q6H PRN Kathie Dike, MD       Or  . LORazepam (ATIVAN) injection 1 mg  1 mg Intravenous Q6H PRN Kathie Dike, MD      . magnesium sulfate IVPB 1 g 100 mL  1 g Intravenous Once Kristen N Ward, DO      . multivitamin with minerals tablet 1 tablet  1 tablet Oral Daily Kathie Dike, MD      . ondansetron (ZOFRAN) tablet 4 mg  4 mg Oral Q6H PRN Kathie Dike, MD       Or  . ondansetron (ZOFRAN) injection 4 mg  4 mg Intravenous Q6H PRN Kathie Dike, MD      . pantoprazole (PROTONIX) 80 mg in sodium chloride 0.9 % 250 mL (0.32 mg/mL) infusion  8 mg/hr Intravenous Continuous Kristen N Ward, DO 25 mL/hr at 10/14/15 [id number] 8 mg/hr at 10/14/15 [id number]  . [START ON 10/17/2015] pantoprazole (PROTONIX) injection 40 mg  40 mg Intravenous Q12H Kristen N Ward, DO      . PHENObarbital (LUMINAL) tablet 30 mg  30 mg Oral BID  Kathie Dike, MD      . phenytoin (DILANTIN) ER capsule 100 mg  100 mg Oral TID Kathie Dike, MD      . potassium chloride 10 mEq in 100 mL IVPB  10 mEq Intravenous Q1 Hr x 6 Kristen N Ward, DO 100 mL/hr at 10/14/15 0824 10 mEq at 10/14/15 0824  . thiamine (B-1) injection 100 mg  100 mg  Intravenous Daily Kristen N Ward, DO      . thiamine (VITAMIN B-1) tablet 100 mg  100 mg Oral Daily Kathie Dike, MD       Or  . thiamine (B-1) injection 100 mg  100 mg Intravenous Daily Kathie Dike, MD        Allergies as of 10/14/2015 - Review Complete 10/14/2015  Allergen Reaction Noted  . Other  08/17/2013    Family History  Problem Relation Age of Onset  . Colon cancer Neg Hx   . Liver disease Sister     etoh    Social History   Social History  . Marital Status: Single    Spouse Name: N/A  . Number of Children: 3  . Years of Education: N/A   Occupational History  . disability    Social History Main Topics  . Smoking status: Former Smoker -- 0.04 packs/day for 9 years    Types: Cigarettes    Quit date: 07/28/2014  . Smokeless tobacco: Never Used  . Alcohol Use: Yes     Comment: last drank today  . Drug Use: No  . Sexual Activity: Not on file   Other Topics Concern  . Not on file   Social History Narrative    Review of Systems: Gen: Denies fever, chills, loss of appetite, change in weight or weight loss CV: occasional chest pain Resp: +DOE  GI: see HPI  GU : Denies urinary burning, urinary frequency, urinary incontinence.  MS: +joint pain  Derm: Denies rash, itching, dry skin Psych: +Depression/anxiety  Heme: see HPI   Physical Exam: Vital signs in last 24 hours: Temp:  [98 F (36.7 C)-98.6 F (37 C)] 98.6 F (37 C) (12/27 0825) Pulse Rate:  [103-112] 112 (12/27 0825) Resp:  [19-24] 21 (12/27 0730) BP: (121-164)/(57-82) 155/70 mmHg (12/27 0825) SpO2:  [97 %-99 %] 99 % (12/27 0825) Weight:  [145 lb 1 oz (65.8 kg)-148 lb (67.132 kg)] 145 lb 1 oz (65.8 kg)  (12/27 0825) Last BM Date: 10/13/15 General:   Drowsy but easily awakens. Continues to drift back to sleep. Difficult to obtain complete and thorough history Head:  Normocephalic and atraumatic. Eyes:  Sclera clear, no icterus.   Conjunctiva pink. Ears:  Normal auditory acuity. Nose:  No deformity, discharge,  or lesions. Mouth:  No deformity or lesions Lungs:  Clear throughout to auscultation.  Heart:  S1 S2 present, tachycardic in low 1teens Abdomen:  Rounded but soft. Positive bowel sounds. Tender to palpation upper abdomen but without rebound or guarding Rectal:  Deferred  Msk:  Symmetrical without gross deformities. Normal posture. Extremities:  Without  edema. Neurologic:  Alert and  oriented to person, place, situation. Confused regarding year stating it is 2017 but quickly reoriented Skin:  Intact without significant lesions or rashes. Psych:  Flat affect, drowsy   Intake/Output from previous day:   Intake/Output this shift:    Lab Results:  Recent Labs  10/14/15 0530 10/14/15 0632  WBC 7.6  --   HGB 13.4 17.3*  HCT 39.4 51.0*  PLT 175  --    BMET  Recent Labs  10/14/15 0530 10/14/15 0632  NA 135 137  K <2.0* 2.2*  CL 80* 81*  CO2 34*  --   GLUCOSE 135* 129*  BUN 10 8  CREATININE 0.60 0.80  CALCIUM 8.7*  --    LFT  Recent Labs  10/14/15 0530  PROT 8.2*  ALBUMIN 4.0  AST 142*  ALT 43  ALKPHOS  53  BILITOT 1.2   PT/INR  Recent Labs  10/14/15 0530  LABPROT 14.8  INR 1.14    Studies/Results: Dg Abd Acute W/chest  10/14/2015  CLINICAL DATA:  Acute onset of altered mental status and hematemesis. Generalized abdominal pain. Initial encounter. EXAM: DG ABDOMEN ACUTE W/ 1V CHEST COMPARISON:  Chest radiograph performed 10/18/2014, and abdominal radiographs performed 01/06/2012 FINDINGS: The lungs are well-aerated and clear. There is no evidence of focal opacification, pleural effusion or pneumothorax. The cardiomediastinal silhouette is within  normal limits. The visualized bowel gas pattern is unremarkable. Scattered stool and air are seen within the colon; there is no evidence of small bowel dilatation to suggest obstruction. No free intra-abdominal air is identified on the provided upright view. No acute osseous abnormalities are seen; the sacroiliac joints are unremarkable in appearance. Degenerative change is noted along the lower lumbar spine. IMPRESSION: 1. Unremarkable bowel gas pattern; no free intra-abdominal air seen. Small to moderate amount of stool noted in the colon. 2. No acute cardiopulmonary process seen. Electronically Signed   By: Garald Balding M.D.   On: 10/14/2015 06:19    Impression: 59 year old female with history of ETOH abuse, severe erosive esophagitis documented on past EGD in Jan 2016, presenting with recurrent hematemesis and abdominal pain in the setting of recent alcohol intake (alcohol level markedly elevated at 166). She also endorses NSAID use as outpatient. She has no known liver disease and no evidence of varices or stigmata of liver disease on recent EGD in Jan 2016. Hematemesis most likely secondary to Mallory-Weiss tear with other differentials to include esophagitis, possible NSAID-induced gastritis/ulcer disease. Does not appear to be a hemodynamically significant upper GI bleed but will continue to follow for any further overt GI bleeding, acute blood loss anemia. No urgent EGD indicated but would pursue upper GI evaluation this admission if persistent hematemesis or evidence of acute blood loss anemia. Remain NPO for now and may continue Protonix infusion. Hypokalemia per attending.   Elevated AST: consistent with alcoholic hepatitis.   Plan: Remain NPO Supportive measures Continue Protonix infusion Continue serial H/H every 8 hours Hypokalemia per attending Will order scheduled Zofran around the clock EGD if persistent overt GI bleeding and/or acute blood loss anemia Will continue to follow with  you ETOH cessation strongly recommended: patient states she is willing to consider this. May need rehab facility.    Orvil Feil, ANP-BC [facility]      LOS: 0 days    10/14/2015, 8:47 AM  GI attending note: Patient interviewed and examined. Suspect upper GI bleed secondary to alcoholic gastritis Mallory-Weiss tear or erosive reflux esophagitis which she has history of. Last EGD was on 10/18/2014. Will monitor progress closely and if hemoglobin drops to the point that she needs transfusion will consider EGD. She has mild thrombocytopenia but no other stigmata of tonic liver disease. Long-term prognosis poor without alcohol rehabilitation.

## 2015-10-14 NOTE — ED Provider Notes (Signed)
TIME SEEN: 4:50 AM  CHIEF COMPLAINT: Abdominal pain, hematemesis  HPI: Pt is a 59 y.o. female with history of seizures on Dilantin and phenobarbital, hypertension, peptic ulcer disease, esophagitis, alcohol abuse who presents to the emergency department with complaints of hematemesis that started last night. She has coffee-ground emesis and states she is seeing dark red blood as well. Is complaining of right upper abdominal pain and epigastric pain. No bloody stools or melena. Not on anticoagulation. States she has been drinking alcohol today. It appears her last endoscopy with Dr. Gala Romney was 10/18/2014 which showed severe erosive esophagitis. No history of esophageal varices. She is status post hysterectomy.   ROS: See HPI Constitutional: no fever  Eyes: no drainage  ENT: no runny nose   Cardiovascular:  no chest pain  Resp: no SOB  GI:  vomiting GU: no dysuria Integumentary: no rash  Allergy: no hives  Musculoskeletal: no leg swelling  Neurological: no slurred speech ROS otherwise negative  PAST MEDICAL HISTORY/PAST SURGICAL HISTORY:  Past Medical History  Diagnosis Date  . Stroke [facility])     age 33, required brain surgery  . Seizures ([place])     since stroke, no recent seizures  . HTN (hypertension)   . PUD (peptic ulcer disease)     remote  . Shortness of breath     with exertion  . Contracture of muscle of hand     right  . Alcohol abuse   . Chronic abdominal pain   . Chronic diarrhea     MEDICATIONS:  Prior to Admission medications   Medication Sig Start Date End Date Taking? Authorizing Provider  lisinopril-hydrochlorothiazide (PRINZIDE,ZESTORETIC) 20-12.5 MG per tablet Take 1 tablet by mouth daily.   Yes Historical Provider, MD  omeprazole (PRILOSEC) 20 MG capsule Take 20 mg by mouth daily.   Yes Historical Provider, MD  phenytoin (DILANTIN) 100 MG ER capsule Take 100 mg by mouth 3 (three) times daily.   Yes Historical Provider, MD  solifenacin (VESICARE) 5 MG tablet  Take 5 mg by mouth daily.   Yes Historical Provider, MD  albuterol (PROVENTIL HFA;VENTOLIN HFA) 108 (90 BASE) MCG/ACT inhaler Inhale 2 puffs into the lungs every 6 (six) hours as needed. FOR SHORTNESS OF BREATH AND/OR WHEEZING    Historical Provider, MD  amLODipine (NORVASC) 5 MG tablet Take 5 mg by mouth daily.    Historical Provider, MD  ipratropium-albuterol (DUONEB) 0.5-2.5 (3) MG/3ML SOLN Take 3 mLs by nebulization every 6 (six) hours as needed (for shortness of breath/wheezing).     Historical Provider, MD  pantoprazole (PROTONIX) 40 MG tablet Take 1 tablet (40 mg total) by mouth 2 (two) times daily. 10/20/14   [name], MD  PHENObarbital (LUMINAL) 30 MG tablet Take 30 mg by mouth 2 (two) times daily.    Historical Provider, MD  sucralfate (CARAFATE) 1 GM/10ML suspension Take 10 mLs (1 g total) by mouth 4 (four) times daily -  with meals and at bedtime. Patient not taking: Reported on 12/26/2014 10/20/14   [name], MD  thiamine 100 MG tablet Take 1 tablet (100 mg total) by mouth daily. Patient not taking: Reported on 12/26/2014 10/20/14   [name], MD    ALLERGIES:  Allergies  Allergen Reactions  . Other     Unable to recall med    SOCIAL HISTORY:  Social History  Substance Use Topics  . Smoking status: Former Smoker -- 0.04 packs/day for 9 years    Types: Cigarettes    Quit  date: 07/28/2014  . Smokeless tobacco: Never Used  . Alcohol Use: Yes     Comment: last drank today    FAMILY HISTORY: Family History  Problem Relation Age of Onset  . Colon cancer Neg Hx   . Liver disease Sister     etoh    EXAM: BP 121/73 mmHg  Pulse 109  Temp(Src) 98 F (36.7 C) (Oral)  Resp 20  Ht 5\' 4"  (1.626 m)  Wt 148 lb (67.132 kg)  BMI 25.39 kg/m2  SpO2 98% CONSTITUTIONAL: Alert and oriented and responds appropriately to questions. Chronically ill-appearing, has coffee-ground emesis HEAD: Normocephalic EYES: Conjunctivae clear, PERRL ENT: normal nose; no rhinorrhea; moist  mucous membranes; pharynx without lesions noted NECK: Supple, no meningismus, no LAD  CARD: Regular and tachycardic; S1 and S2 appreciated; no murmurs, no clicks, no rubs, no gallops RESP: Normal chest excursion without splinting or tachypnea; breath sounds clear and equal bilaterally; no wheezes, no rhonchi, no rales, no hypoxia or respiratory distress, speaking full sentences ABD/GI: Normal bowel sounds; non-distended; soft, tender in the right upper quadrant and epigastric region, no rebound, no guarding, no peritoneal signs, negative Murphy sign BACK:  The back appears normal and is non-tender to palpation, there is no CVA tenderness EXT: Normal ROM in all joints; non-tender to palpation; no edema; normal capillary refill; no cyanosis, no calf tenderness or swelling    SKIN: Normal color for age and race; warm NEURO: Moves all extremities equally, sensation to light touch intact diffusely, cranial nerves II through XII intact PSYCH: The patient's mood and manner are appropriate. Grooming and personal hygiene are appropriate.  MEDICAL DECISION MAKING: Patient here with hematemesis. She does have coffee-ground emesis currently. Slightly tachycardic but otherwise hemodynamically stable. We'll give IV fluids, Protonix. She does not have a history of known esophageal varices. We'll obtain labs, acute abdominal series to evaluate for signs of perforation although no peritoneal signs on abdominal exam currently. She will need a GI consult and admission.  ED PROGRESS: Patient's hemoglobin is 13.4. Her potassium is 1.9. This was drawn directly off of an IV that had not been flushed or had any saline running through it. Will repeat this to verify that I feel it is likely real. No changes on her EKG does show sinus tachycardia and a prolonged QTC of 496 ms. We'll check her magnesium level. We'll give IV potassium. It appears improved his labs she has had hypokalemia before. She is on hydrochlorothiazide. X-ray  shows no free air or bowel obstruction. [facility] consult gastroenterology on call to see patient in consult and discuss with hospitalist for admission.   6:20 AM  D/w Dr. Laural Golden to see pt in consult.   6:30 AM  D/w Dr. [name] with hospitalist service who agrees on admission to step down, inpatient. I will place holding orders per his request.   EKG Interpretation  Date/Time:  Tuesday October 14 2015 05:51:40 EST Ventricular Rate:  101 PR Interval:  151 QRS Duration: 94 QT Interval:  383 QTC Calculation: 496 R Axis:   50 Text Interpretation:  Sinus tachycardia Borderline prolonged QT interval Baseline wander in lead(s) V5 Confirmed by Joell Usman,  DO, Larin Depaoli [id number]) on 10/14/2015 5:59:43 AM         CRITICAL CARE Performed by: Nyra Jabs   Total critical care time: 60 minutes  Critical care time was exclusive of separately billable procedures and treating other patients.  Critical care was necessary to treat or prevent imminent or life-threatening deterioration.  Critical care was time spent personally by me on the following activities: development of treatment plan with patient and/or surrogate as well as nursing, discussions with consultants, evaluation of patient's response to treatment, examination of patient, obtaining history from patient or surrogate, ordering and performing treatments and interventions, ordering and review of laboratory studies, ordering and review of radiographic studies, pulse oximetry and re-evaluation of patient's condition.     Angiocath insertion Performed by: Nyra Jabs  Consent: Verbal consent obtained. Risks and benefits: risks, benefits and alternatives were discussed Time out: Immediately prior to procedure a "time out" was called to verify the correct patient, procedure, equipment, support staff and site/side marked as required.  Preparation: Patient was prepped and draped in the usual sterile fashion.  Vein Location: R  EJ  Ultrasound Guided  Gauge: 20  Normal blood return and flush without difficulty Patient tolerance: Patient tolerated the procedure well with no immediate complications.     [place], DO 10/14/15 [phone]

## 2015-10-15 ENCOUNTER — Inpatient Hospital Stay (HOSPITAL_COMMUNITY): Payer: Medicare Other

## 2015-10-15 DIAGNOSIS — D649 Anemia, unspecified: Secondary | ICD-10-CM | POA: Diagnosis not present

## 2015-10-15 DIAGNOSIS — D696 Thrombocytopenia, unspecified: Secondary | ICD-10-CM | POA: Diagnosis not present

## 2015-10-15 DIAGNOSIS — F1020 Alcohol dependence, uncomplicated: Secondary | ICD-10-CM | POA: Diagnosis not present

## 2015-10-15 DIAGNOSIS — K920 Hematemesis: Secondary | ICD-10-CM | POA: Diagnosis not present

## 2015-10-15 DIAGNOSIS — F1010 Alcohol abuse, uncomplicated: Secondary | ICD-10-CM | POA: Diagnosis not present

## 2015-10-15 DIAGNOSIS — K922 Gastrointestinal hemorrhage, unspecified: Secondary | ICD-10-CM | POA: Diagnosis not present

## 2015-10-15 DIAGNOSIS — R7989 Other specified abnormal findings of blood chemistry: Secondary | ICD-10-CM | POA: Diagnosis not present

## 2015-10-15 DIAGNOSIS — R110 Nausea: Secondary | ICD-10-CM | POA: Diagnosis not present

## 2015-10-15 LAB — CBC
HCT: 31.2 % — ABNORMAL LOW (ref 36.0–46.0)
HCT: 33.1 % — ABNORMAL LOW (ref 36.0–46.0)
Hemoglobin: 10.8 g/dL — ABNORMAL LOW (ref 12.0–15.0)
Hemoglobin: 11.2 g/dL — ABNORMAL LOW (ref 12.0–15.0)
MCH: 31.5 pg (ref 26.0–34.0)
MCH: 31.7 pg (ref 26.0–34.0)
MCHC: 33.8 g/dL (ref 30.0–36.0)
MCHC: 34.6 g/dL (ref 30.0–36.0)
MCV: 91.5 fL (ref 78.0–100.0)
MCV: 93 fL (ref 78.0–100.0)
Platelets: 123 10*3/uL — ABNORMAL LOW (ref 150–400)
Platelets: 124 10*3/uL — ABNORMAL LOW (ref 150–400)
RBC: 3.41 MIL/uL — ABNORMAL LOW (ref 3.87–5.11)
RBC: 3.56 MIL/uL — ABNORMAL LOW (ref 3.87–5.11)
RDW: 13.3 % (ref 11.5–15.5)
RDW: 13.4 % (ref 11.5–15.5)
WBC: 5.2 10*3/uL (ref 4.0–10.5)
WBC: 6 10*3/uL (ref 4.0–10.5)

## 2015-10-15 LAB — COMPREHENSIVE METABOLIC PANEL
ALT: 24 U/L (ref 14–54)
AST: 49 U/L — ABNORMAL HIGH (ref 15–41)
Albumin: 2.8 g/dL — ABNORMAL LOW (ref 3.5–5.0)
Alkaline Phosphatase: 55 U/L (ref 38–126)
Anion gap: 7 (ref 5–15)
BUN: 9 mg/dL (ref 6–20)
CO2: 28 mmol/L (ref 22–32)
Calcium: 8 mg/dL — ABNORMAL LOW (ref 8.9–10.3)
Chloride: 101 mmol/L (ref 101–111)
Creatinine, Ser: 0.6 mg/dL (ref 0.44–1.00)
GFR calc Af Amer: >60 mL/min (ref >60)
GFR calc non Af Amer: >60 mL/min (ref >60)
Glucose, Bld: 85 mg/dL (ref 65–99)
Potassium: 3.1 mmol/L — ABNORMAL LOW (ref 3.5–5.1)
Sodium: 136 mmol/L (ref 135–145)
Total Bilirubin: 1.7 mg/dL — ABNORMAL HIGH (ref 0.3–1.2)
Total Protein: 5.8 g/dL — ABNORMAL LOW (ref 6.5–8.1)

## 2015-10-15 MED ORDER — PANTOPRAZOLE SODIUM 40 MG IV SOLR
40.0000 mg | Freq: Two times a day (BID) | INTRAVENOUS | Status: DC
  Administered 2015-10-15 – 2015-10-17 (×4): 40 mg via INTRAVENOUS
  Filled 2015-10-15 (×4): qty 40

## 2015-10-15 MED ORDER — CHLORDIAZEPOXIDE HCL 25 MG PO CAPS
50.0000 mg | ORAL_CAPSULE | Freq: Four times a day (QID) | ORAL | Status: AC
  Administered 2015-10-15 – 2015-10-16 (×3): 50 mg via ORAL
  Filled 2015-10-15 (×3): qty 2

## 2015-10-15 NOTE — Plan of Care (Signed)
Problem: Education: Goal: Knowledge of [facility] Education information/materials will improve Outcome: Progressing Discuss the benefits stop drinking alcholol

## 2015-10-15 NOTE — Plan of Care (Signed)
Problem: Fluid Volume: Goal: Ability to maintain a balanced intake and output will improve Outcome: Progressing Tolerating po medications

## 2015-10-15 NOTE — Progress Notes (Signed)
Subjective:  Patient complains of being hungry. Denies abdominal pain. No BM in 2 days. Denies melena, brbpr.   Objective: Vital signs in last 24 hours: Temp:  [98.2 F (36.8 C)-98.7 F (37.1 C)] 98.6 F (37 C) (12/28 0400) Pulse Rate:  [88-117] 88 (12/28 0700) Resp:  [17-25] 18 (12/28 0700) BP: (115-173)/(59-87) 154/78 mmHg (12/28 0700) SpO2:  [88 %-100 %] 99 % (12/28 0700) Weight:  [145 lb 1 oz (65.8 kg)-149 lb 0.5 oz (67.6 kg)] 149 lb 0.5 oz (67.6 kg) (12/28 0500) Last BM Date: 10/13/15 General:   Alert,  Well-developed, well-nourished, pleasant and cooperative in NAD Head:  Normocephalic and atraumatic. Eyes:  Sclera clear, no icterus.  Chest: CTA bilaterally without rales, rhonchi, crackles.    Heart:  Regular rate and rhythm; no murmurs, clicks, rubs,  or gallops. Abdomen:  Soft, nontender and nondistended.  Normal bowel sounds, without guarding, and without rebound.   Extremities:  Without clubbing, deformity or edema. Neurologic:  Alert and  oriented x4;  grossly normal neurologically. Skin:  Intact without significant lesions or rashes. Psych:  Alert and cooperative. Normal mood and affect.  Intake/Output from previous day: 12/27 0701 - 12/28 0700 In: 1878.3 [I.V.:1178.3; IV Piggyback:700] Out: 1950 [Urine:1950] Intake/Output this shift:    Lab Results: CBC  Recent Labs  10/14/15 1643 10/15/15 0037 10/15/15 0755  WBC 7.0 6.0 5.2  HGB 10.6* 10.8* 11.2*  HCT 30.3* 31.2* 33.1*  MCV 91.5 91.5 93.0  PLT 136* 123* 124*   BMET  Recent Labs  10/14/15 0530 10/14/15 0632 10/15/15 0423  NA 135 137 136  K <2.0* 2.2* 3.1*  CL 80* 81* 101  CO2 34*  --  28  GLUCOSE 135* 129* 85  BUN 10 8 9   CREATININE 0.60 0.80 0.60  CALCIUM 8.7*  --  8.0*   LFTs  Recent Labs  10/14/15 0530 10/15/15 0423  BILITOT 1.2 1.7*  ALKPHOS 78 55  AST 142* 49*  ALT 43 24  PROT 8.2* 5.8*  ALBUMIN 4.0 2.8*   No results for input(s): LIPASE in the last 72  hours. PT/INR  Recent Labs  10/14/15 0530 10/14/15 0910  LABPROT 14.8 15.8*  INR 1.14 1.24      Imaging Studies: Dg Abd Acute W/chest  10/14/2015  CLINICAL DATA:  Acute onset of altered mental status and hematemesis. Generalized abdominal pain. Initial encounter. EXAM: DG ABDOMEN ACUTE W/ 1V CHEST COMPARISON:  Chest radiograph performed 10/18/2014, and abdominal radiographs performed 01/06/2012 FINDINGS: The lungs are well-aerated and clear. There is no evidence of focal opacification, pleural effusion or pneumothorax. The cardiomediastinal silhouette is within normal limits. The visualized bowel gas pattern is unremarkable. Scattered stool and air are seen within the colon; there is no evidence of small bowel dilatation to suggest obstruction. No free intra-abdominal air is identified on the provided upright view. No acute osseous abnormalities are seen; the sacroiliac joints are unremarkable in appearance. Degenerative change is noted along the lower lumbar spine. IMPRESSION: 1. Unremarkable bowel gas pattern; no free intra-abdominal air seen. Small to moderate amount of stool noted in the colon. 2. No acute cardiopulmonary process seen. Electronically Signed   By: Garald Balding M.D.   On: 10/14/2015 06:19   Mm Digital Screening Bilateral  10/07/2015  CLINICAL DATA:  Screening. EXAM: DIGITAL SCREENING BILATERAL MAMMOGRAM WITH CAD COMPARISON:  Previous exam(s). ACR Breast Density Category b: There are scattered areas of fibroglandular density. FINDINGS: There are no findings suspicious for malignancy. Images were processed with  CAD. IMPRESSION: No mammographic evidence of malignancy. A result letter of this screening mammogram will be mailed directly to the patient. RECOMMENDATION: Screening mammogram in one year. (Code:SM-B-01Y) BI-RADS CATEGORY  1: Negative. Electronically Signed   By: Claudie Revering M.D.   On: 10/07/2015 17:35  [2 weeks]   Assessment: 59 year old female with history of ETOH  abuse, severe erosive esophagitis documented on past EGD in Jan 2016, presenting with recurrent hematemesis and abdominal pain in the setting of recent alcohol intake (alcohol level markedly elevated at 166). She also endorses NSAID use as outpatient. She has no known liver disease and no evidence of varices or stigmata of liver disease on recent EGD in Jan 2016. Although she has not had any imaging of her liver. She had normal platelet count on admission but now mild thrombocytopenia. Cannot rule out underlying chronic liver disease.  Hematemesis most likely secondary to Mallory-Weiss tear with other differentials to include esophagitis, possible NSAID-induced gastritis/ulcer disease. Does not appear to be a hemodynamically significant upper GI bleed but will continue to follow for any further overt GI bleeding, acute blood loss anemia.  May continue Protonix infusion. Get baseline abdominal u/s and if evidence of cirrhosis.  Hypokalemia per attending.   Elevated AST: consistent with alcoholic hepatitis. Improving.  Anemia: stable. No signs of overt GI bleeding.   Plan: 1. Continue PPI. 2. Check Hgb daily. 3. full liquids.  4. Abdominal u/s. If evidence of cirrhosis, then consider EGD this admission. Discussed with Dr. [name].   5. ETOH cessation strongly recommended, otherwise long term prognosis poor.  6. Will continue to follow with you.   Laureen Ochs. Bernarda Caffey [facility] [phone] 12/28/20169:16 AM     LOS: 1 day    Addendum: abd u/s with no signs of cirrhosis. Full liquid diet.   Laureen Ochs. Bernarda Caffey [facility] [phone] 12/28/20163:01 PM

## 2015-10-15 NOTE — Care Management Note (Signed)
Case Management Note  Patient Details  Name: [name] MRN: [id number] Date of Birth: Aug 12, 1956  Subjective/Objective:                  Pt is from home, lives with her boyfriend and requires assistance with ADL's at baseline. Pt ambulates independently and has a ramp at home but does not have cane or walker. Pt has neb machine at home. Pt has aid that comes for a few hours a day three days a week. Pt plans to return home with self care at DC.   Action/Plan: PT eval pending. No CM needs anticipated.   Expected Discharge Date:    10/15/2015              Expected Discharge Plan:  Home/Self Care  In-House Referral:  NA  Discharge planning Services  CM Consult  Post Acute Care Choice:  NA Choice offered to:  NA  DME Arranged:    DME Agency:     HH Arranged:    HH Agency:     Status of Service:  In process, will continue to follow  Medicare Important Message Given:    Date Medicare IM Given:    Medicare IM give by:    Date Additional Medicare IM Given:    Additional Medicare Important Message give by:     If discussed at [place] of Stay Meetings, dates discussed:    Additional Comments:  Sherald Barge, RN 10/15/2015, 2:00 PM

## 2015-10-15 NOTE — Progress Notes (Signed)
Subjective: Patient was admitted due to hematemesis. Patient has history of chronic alcohol abuse. No major drop in H/H.  Objective: Vital signs in last 24 hours: Temp:  [98.2 F (36.8 C)-98.7 F (37.1 C)] 98.6 F (37 C) (12/28 0400) Pulse Rate:  [88-117] 88 (12/28 0700) Resp:  [17-25] 18 (12/28 0700) BP: (115-173)/(59-87) 154/78 mmHg (12/28 0700) SpO2:  [88 %-100 %] 99 % (12/28 0700) Weight:  [65.8 kg (145 lb 1 oz)-67.6 kg (149 lb 0.5 oz)] 67.6 kg (149 lb 0.5 oz) (12/28 0500) Weight change: -1.332 kg (-2 lb 15 oz) Last BM Date: 10/13/15  Intake/Output from previous day: 12/27 0701 - 12/28 0700 In: 1878.3 [I.V.:1178.3; IV Piggyback:700] Out: 1950 [Urine:1950]  PHYSICAL EXAM General appearance: alert and no distress Resp: clear to auscultation bilaterally Cardio: S1, S2 normal GI: soft, non-tender; bowel sounds normal; no masses,  no organomegaly Extremities: extremities normal, atraumatic, no cyanosis or edema  Lab Results:  Results for orders placed or performed during the hospital encounter of 10/14/15 (from the past 48 hour(s))  Occult bld gastric/duodenum (cup to lab)     Status: Abnormal   Collection Time: 10/14/15  5:25 AM  Result Value Ref Range   pH, Gastric 4    Occult Blood, Gastric POSITIVE (A) NEGATIVE  Magnesium     Status: None   Collection Time: 10/14/15  5:25 AM  Result Value Ref Range   Magnesium 1.8 1.7 - 2.4 mg/dL  CBC with Differential     Status: None   Collection Time: 10/14/15  5:30 AM  Result Value Ref Range   WBC 7.6 4.0 - 10.5 K/uL   RBC 4.31 3.87 - 5.11 MIL/uL   Hemoglobin 13.4 12.0 - 15.0 g/dL   HCT 39.4 36.0 - 46.0 %   MCV 91.4 78.0 - 100.0 fL   MCH 31.1 26.0 - 34.0 pg   MCHC 34.0 30.0 - 36.0 g/dL   RDW 13.1 11.5 - 15.5 %   Platelets 175 150 - 400 K/uL   Neutrophils Relative % 81 %   Neutro Abs 6.1 1.7 - 7.7 K/uL   Lymphocytes Relative 12 %   Lymphs Abs 0.9 0.7 - 4.0 K/uL   Monocytes Relative 7 %   Monocytes Absolute 0.6 0.1 - 1.0  K/uL   Eosinophils Relative 0 %   Eosinophils Absolute 0.0 0.0 - 0.7 K/uL   Basophils Relative 0 %   Basophils Absolute 0.0 0.0 - 0.1 K/uL  Comprehensive metabolic panel     Status: Abnormal   Collection Time: 10/14/15  5:30 AM  Result Value Ref Range   Sodium 135 135 - 145 mmol/L   Potassium <2.0 (LL) 3.5 - 5.1 mmol/L    Comment: CRITICAL RESULT CALLED TO, READ BACK BY AND VERIFIED WITH: HUTCHENS,L AT 6:15AM ON 10/14/15 BY FESTERMAN,C    Chloride 80 (L) 101 - 111 mmol/L   CO2 34 (H) 22 - 32 mmol/L   Glucose, Bld 135 (H) 65 - 99 mg/dL   BUN 10 6 - 20 mg/dL   Creatinine, Ser 0.60 0.44 - 1.00 mg/dL   Calcium 8.7 (L) 8.9 - 10.3 mg/dL   Total Protein 8.2 (H) 6.5 - 8.1 g/dL   Albumin 4.0 3.5 - 5.0 g/dL   AST 142 (H) 15 - 41 U/L   ALT 43 14 - 54 U/L   Alkaline Phosphatase 78 38 - 126 U/L   Total Bilirubin 1.2 0.3 - 1.2 mg/dL   GFR calc non Af Amer >60 >60 mL/min  GFR calc Af Amer >60 >60 mL/min    Comment: (NOTE) The eGFR has been calculated using the CKD EPI equation. This calculation has not been validated in all clinical situations. eGFR's persistently <60 mL/min signify possible Chronic Kidney Disease.   Protime-INR     Status: None   Collection Time: 10/14/15  5:30 AM  Result Value Ref Range   Prothrombin Time 14.8 11.6 - 15.2 seconds   INR 1.14 0.00 - 1.49  APTT     Status: None   Collection Time: 10/14/15  5:30 AM  Result Value Ref Range   aPTT 28 24 - 37 seconds  Type and screen     Status: None   Collection Time: 10/14/15  5:30 AM  Result Value Ref Range   ABO/RH(D) O POS    Antibody Screen NEG    Sample Expiration 10/17/2015   Ethanol     Status: Abnormal   Collection Time: 10/14/15  5:30 AM  Result Value Ref Range   Alcohol, Ethyl (B) 166 (H) <5 mg/dL    Comment:        LOWEST DETECTABLE LIMIT FOR SERUM ALCOHOL IS 5 mg/dL FOR MEDICAL PURPOSES ONLY   Phenytoin level, total     Status: Abnormal   Collection Time: 10/14/15  5:30 AM  Result Value Ref Range    Phenytoin Lvl <2.5 (L) 10.0 - 20.0 ug/mL  Phenobarbital level     Status: Abnormal   Collection Time: 10/14/15  5:30 AM  Result Value Ref Range   Phenobarbital <5.0 (L) 15.0 - 40.0 ug/mL  I-stat chem 8, ed     Status: Abnormal   Collection Time: 10/14/15  6:32 AM  Result Value Ref Range   Sodium 137 135 - 145 mmol/L   Potassium 2.2 (LL) 3.5 - 5.1 mmol/L   Chloride 81 (L) 101 - 111 mmol/L   BUN 8 6 - 20 mg/dL   Creatinine, Ser 0.80 0.44 - 1.00 mg/dL   Glucose, Bld 129 (H) 65 - 99 mg/dL   Calcium, Ion 0.93 (L) 1.12 - 1.23 mmol/L   TCO2 36 0 - 100 mmol/L   Hemoglobin 17.3 (H) 12.0 - 15.0 g/dL   HCT 51.0 (H) 36.0 - 46.0 %   Comment NOTIFIED PHYSICIAN   MRSA PCR Screening     Status: None   Collection Time: 10/14/15  9:00 AM  Result Value Ref Range   MRSA by PCR NEGATIVE NEGATIVE    Comment:        The GeneXpert MRSA Assay (FDA approved for NASAL specimens only), is one component of a comprehensive MRSA colonization surveillance program. It is not intended to diagnose MRSA infection nor to guide or monitor treatment for MRSA infections.   CBC     Status: Abnormal   Collection Time: 10/14/15  9:10 AM  Result Value Ref Range   WBC 9.2 4.0 - 10.5 K/uL   RBC 3.86 (L) 3.87 - 5.11 MIL/uL   Hemoglobin 12.2 12.0 - 15.0 g/dL    Comment: DELTA CHECK NOTED RESULT REPEATED AND VERIFIED    HCT 35.2 (L) 36.0 - 46.0 %   MCV 91.2 78.0 - 100.0 fL   MCH 31.6 26.0 - 34.0 pg   MCHC 34.7 30.0 - 36.0 g/dL   RDW 13.2 11.5 - 15.5 %   Platelets 145 (L) 150 - 400 K/uL  Protime-INR     Status: Abnormal   Collection Time: 10/14/15  9:10 AM  Result Value Ref Range   Prothrombin Time  15.8 (H) 11.6 - 15.2 seconds   INR 1.24 0.00 - 1.49  Urinalysis, Routine w reflex microscopic (not at [facility])     Status: Abnormal   Collection Time: 10/14/15 10:45 AM  Result Value Ref Range   Color, Urine YELLOW YELLOW   APPearance CLEAR CLEAR   Specific Gravity, Urine 1.010 1.005 - 1.030   pH 6.5 5.0 - 8.0    Glucose, UA NEGATIVE NEGATIVE mg/dL   Hgb urine dipstick TRACE (A) NEGATIVE   Bilirubin Urine NEGATIVE NEGATIVE   Ketones, ur 15 (A) NEGATIVE mg/dL   Protein, ur NEGATIVE NEGATIVE mg/dL   Nitrite NEGATIVE NEGATIVE   Leukocytes, UA NEGATIVE NEGATIVE  Urine rapid drug screen (hosp performed)     Status: Abnormal   Collection Time: 10/14/15 10:45 AM  Result Value Ref Range   Opiates NONE DETECTED NONE DETECTED   Cocaine NONE DETECTED NONE DETECTED   Benzodiazepines NONE DETECTED NONE DETECTED   Amphetamines NONE DETECTED NONE DETECTED   Tetrahydrocannabinol NONE DETECTED NONE DETECTED   Barbiturates POSITIVE (A) NONE DETECTED    Comment:        DRUG SCREEN FOR MEDICAL PURPOSES ONLY.  IF CONFIRMATION IS NEEDED FOR ANY PURPOSE, NOTIFY LAB WITHIN 5 DAYS.        LOWEST DETECTABLE LIMITS FOR URINE DRUG SCREEN Drug Class       Cutoff (ng/mL) Amphetamine      1000 Barbiturate      200 Benzodiazepine   009 Tricyclics       233 Opiates          300 Cocaine          300 THC              50   Urine microscopic-add on     Status: Abnormal   Collection Time: 10/14/15 10:45 AM  Result Value Ref Range   Squamous Epithelial / LPF 0-5 (A) NONE SEEN   WBC, UA NONE SEEN 0 - 5 WBC/hpf   RBC / HPF 0-5 0 - 5 RBC/hpf   Bacteria, UA NONE SEEN NONE SEEN  CBC     Status: Abnormal   Collection Time: 10/14/15  4:43 PM  Result Value Ref Range   WBC 7.0 4.0 - 10.5 K/uL   RBC 3.31 (L) 3.87 - 5.11 MIL/uL   Hemoglobin 10.6 (L) 12.0 - 15.0 g/dL   HCT 30.3 (L) 36.0 - 46.0 %   MCV 91.5 78.0 - 100.0 fL   MCH 32.0 26.0 - 34.0 pg   MCHC 35.0 30.0 - 36.0 g/dL   RDW 13.2 11.5 - 15.5 %   Platelets 136 (L) 150 - 400 K/uL  CBC     Status: Abnormal   Collection Time: 10/15/15 12:37 AM  Result Value Ref Range   WBC 6.0 4.0 - 10.5 K/uL   RBC 3.41 (L) 3.87 - 5.11 MIL/uL   Hemoglobin 10.8 (L) 12.0 - 15.0 g/dL   HCT 31.2 (L) 36.0 - 46.0 %   MCV 91.5 78.0 - 100.0 fL   MCH 31.7 26.0 - 34.0 pg   MCHC 34.6  30.0 - 36.0 g/dL   RDW 13.3 11.5 - 15.5 %   Platelets 123 (L) 150 - 400 K/uL  Comprehensive metabolic panel     Status: Abnormal   Collection Time: 10/15/15  4:23 AM  Result Value Ref Range   Sodium 136 135 - 145 mmol/L   Potassium 3.1 (L) 3.5 - 5.1 mmol/L    Comment: DELTA CHECK  NOTED   Chloride 101 101 - 111 mmol/L   CO2 28 22 - 32 mmol/L   Glucose, Bld 85 65 - 99 mg/dL   BUN 9 6 - 20 mg/dL   Creatinine, Ser 0.60 0.44 - 1.00 mg/dL   Calcium 8.0 (L) 8.9 - 10.3 mg/dL   Total Protein 5.8 (L) 6.5 - 8.1 g/dL   Albumin 2.8 (L) 3.5 - 5.0 g/dL   AST 49 (H) 15 - 41 U/L   ALT 24 14 - 54 U/L   Alkaline Phosphatase 55 38 - 126 U/L   Total Bilirubin 1.7 (H) 0.3 - 1.2 mg/dL   GFR calc non Af Amer >60 >60 mL/min   GFR calc Af Amer >60 >60 mL/min    Comment: (NOTE) The eGFR has been calculated using the CKD EPI equation. This calculation has not been validated in all clinical situations. eGFR's persistently <60 mL/min signify possible Chronic Kidney Disease.    Anion gap 7 5 - 15    ABGS  Recent Labs  10/14/15 0632  TCO2 36   CULTURES Recent Results (from the past 240 hour(s))  MRSA PCR Screening     Status: None   Collection Time: 10/14/15  9:00 AM  Result Value Ref Range Status   MRSA by PCR NEGATIVE NEGATIVE Final    Comment:        The GeneXpert MRSA Assay (FDA approved for NASAL specimens only), is one component of a comprehensive MRSA colonization surveillance program. It is not intended to diagnose MRSA infection nor to guide or monitor treatment for MRSA infections.    Studies/Results: Dg Abd Acute W/chest  10/14/2015  CLINICAL DATA:  Acute onset of altered mental status and hematemesis. Generalized abdominal pain. Initial encounter. EXAM: DG ABDOMEN ACUTE W/ 1V CHEST COMPARISON:  Chest radiograph performed 10/18/2014, and abdominal radiographs performed 01/06/2012 FINDINGS: The lungs are well-aerated and clear. There is no evidence of focal opacification,  pleural effusion or pneumothorax. The cardiomediastinal silhouette is within normal limits. The visualized bowel gas pattern is unremarkable. Scattered stool and air are seen within the colon; there is no evidence of small bowel dilatation to suggest obstruction. No free intra-abdominal air is identified on the provided upright view. No acute osseous abnormalities are seen; the sacroiliac joints are unremarkable in appearance. Degenerative change is noted along the lower lumbar spine. IMPRESSION: 1. Unremarkable bowel gas pattern; no free intra-abdominal air seen. Small to moderate amount of stool noted in the colon. 2. No acute cardiopulmonary process seen. Electronically Signed   By: Garald Balding M.D.   On: 10/14/2015 06:19    Medications: I have reviewed the patient's current medications.  Assesment:   Active Problems:   Alcohol abuse   GERD (gastroesophageal reflux disease)   Sinus tachycardia (HCC)   Hypokalemia   Seizure disorder (HCC)   Hematemesis   GI bleed    Plan:  Medications reviewed GI consult appreciated Continue current treatment Will monitor CBC    LOS: 1 day   Leelynn Whetsel 10/15/2015, 8:07 AM

## 2015-10-16 ENCOUNTER — Telehealth: Payer: Self-pay | Admitting: Gastroenterology

## 2015-10-16 ENCOUNTER — Encounter: Payer: Self-pay | Admitting: Internal Medicine

## 2015-10-16 DIAGNOSIS — K922 Gastrointestinal hemorrhage, unspecified: Secondary | ICD-10-CM | POA: Diagnosis not present

## 2015-10-16 DIAGNOSIS — K920 Hematemesis: Secondary | ICD-10-CM | POA: Insufficient documentation

## 2015-10-16 DIAGNOSIS — D649 Anemia, unspecified: Secondary | ICD-10-CM | POA: Diagnosis not present

## 2015-10-16 DIAGNOSIS — R945 Abnormal results of liver function studies: Secondary | ICD-10-CM | POA: Insufficient documentation

## 2015-10-16 DIAGNOSIS — F1020 Alcohol dependence, uncomplicated: Secondary | ICD-10-CM | POA: Diagnosis not present

## 2015-10-16 DIAGNOSIS — R7989 Other specified abnormal findings of blood chemistry: Secondary | ICD-10-CM

## 2015-10-16 DIAGNOSIS — R110 Nausea: Secondary | ICD-10-CM | POA: Diagnosis not present

## 2015-10-16 DIAGNOSIS — F1010 Alcohol abuse, uncomplicated: Secondary | ICD-10-CM | POA: Diagnosis not present

## 2015-10-16 LAB — CBC
HCT: 31.8 % — ABNORMAL LOW (ref 36.0–46.0)
Hemoglobin: 10.7 g/dL — ABNORMAL LOW (ref 12.0–15.0)
MCH: 31.2 pg (ref 26.0–34.0)
MCHC: 33.6 g/dL (ref 30.0–36.0)
MCV: 92.7 fL (ref 78.0–100.0)
Platelets: 111 10*3/uL — ABNORMAL LOW (ref 150–400)
RBC: 3.43 MIL/uL — ABNORMAL LOW (ref 3.87–5.11)
RDW: 12.6 % (ref 11.5–15.5)
WBC: 5 10*3/uL (ref 4.0–10.5)

## 2015-10-16 LAB — HEPATITIS B SURFACE ANTIGEN: Hepatitis B Surface Ag: NEGATIVE

## 2015-10-16 LAB — HEPATITIS C ANTIBODY: HCV Ab: <0.1 s/co ratio (ref 0.0–0.9)

## 2015-10-16 NOTE — Telephone Encounter (Signed)
Please schedule hospital follow up for 4-6 weeks.

## 2015-10-16 NOTE — Care Management Note (Signed)
Case Management Note  Patient Details  Name: [name] MRN: [id number] Date of Birth: 09-08-1956   Expected Discharge Date:    10/17/2015              Expected Discharge Plan:  [place]  In-House Referral:  NA  Discharge planning Services  CM Consult  Post Acute Care Choice:  Durable Medical Equipment, Home Health Choice offered to:  Patient  DME Arranged:  Bedside commode, Walker hemi DME Agency:  [facility]  HH Arranged:  PT, RN [place] Agency:  [place]  Status of Service:  In process, will continue to follow  Medicare Important Message Given:    Date Medicare IM Given:    Medicare IM give by:    Date Additional Medicare IM Given:    Additional Medicare Important Message give by:     If discussed at [place] of Stay Meetings, dates discussed:    Additional Comments: PT has seen pt and recommendations for hemi walker, BSC and HH PT were made. Pt is agreeable to [facility] and would like services through [facility]. Pt would like DME to come from [place]. Anticipate DC home 10/17/2015. Will make referrals then.   Sherald Barge, RN 10/16/2015, 3:56 PM

## 2015-10-16 NOTE — Telephone Encounter (Signed)
APPT MADE AND LETTER SENT  °

## 2015-10-16 NOTE — Care Management Obs Status (Signed)
[place] NOTIFICATION   Patient Details  Name: [name] MRN: [id number] Date of Birth: Jul 30, 1956   Medicare Observation Status Notification Given:  Yes    Sherald Barge, RN 10/16/2015, 4:02 PM

## 2015-10-16 NOTE — Progress Notes (Signed)
Subjective:  Hungry. No complaints. No vomiting. No melena.  Objective: Vital signs in last 24 hours: Temp:  [98.3 F (36.8 C)-98.9 F (37.2 C)] 98.3 F (36.8 C) (12/29 0400) Pulse Rate:  [79-101] 79 (12/29 0700) Resp:  [12-23] 20 (12/29 0600) BP: (99-178)/(57-108) 139/67 mmHg (12/29 0700) SpO2:  [92 %-100 %] 100 % (12/29 0600) Last BM Date: 10/13/15 General:   Alert,  Well-developed, well-nourished, pleasant and cooperative in NAD Head:  Normocephalic and atraumatic. Eyes:  Sclera clear, no icterus.  Abdomen:  Soft, nontender and nondistended.  Normal bowel sounds, without guarding, and without rebound.   Extremities:  Without clubbing, deformity or edema. Neurologic:  Alert and  oriented x4;  grossly normal neurologically. Skin:  Intact without significant lesions or rashes. Psych:  Alert and cooperative. Normal mood and affect.  Intake/Output from previous day: 12/28 0701 - 12/29 0700 In: 1604.6 [P.O.:120; I.V.:1484.6] Out: 4000 [Urine:4000] Intake/Output this shift:    Lab Results: CBC  Recent Labs  10/15/15 0037 10/15/15 0755 10/16/15 0407  WBC 6.0 5.2 5.0  HGB 10.8* 11.2* 10.7*  HCT 31.2* 33.1* 31.8*  MCV 91.5 93.0 92.7  PLT 123* 124* 111*   BMET  Recent Labs  10/14/15 0530 10/14/15 0632 10/15/15 0423  NA 135 137 136  K <2.0* 2.2* 3.1*  CL 80* 81* 101  CO2 34*  --  28  GLUCOSE 135* 129* 85  BUN 10 8 9   CREATININE 0.60 0.80 0.60  CALCIUM 8.7*  --  8.0*   LFTs  Recent Labs  10/14/15 0530 10/15/15 0423  BILITOT 1.2 1.7*  ALKPHOS 78 55  AST 142* 49*  ALT 43 24  PROT 8.2* 5.8*  ALBUMIN 4.0 2.8*   No results for input(s): LIPASE in the last 72 hours. PT/INR  Recent Labs  10/14/15 0530 10/14/15 0910  LABPROT 14.8 15.8*  INR 1.14 1.24      Imaging Studies: US Abdomen Complete  10/15/2015  CLINICAL DATA:  Alcohol abuse. EXAM: ABDOMEN ULTRASOUND COMPLETE COMPARISON:  10/14/2015 FINDINGS: Gallbladder: No gallstones or wall thickening  visualized. No sonographic Murphy sign noted by sonographer. Common bile duct: Diameter: 2 mm. Liver: No focal lesion identified. Within normal limits in parenchymal echogenicity. IVC: No abnormality visualized. Pancreas: Visualized portion unremarkable. Spleen: The spleen measures 6 cm in length and has a volume of 127.2 cubic cm. Right Kidney: Length: 12.1 cm. Echogenicity within normal limits. No mass or hydronephrosis visualized. Left Kidney: Length: 12.7 cm. Echogenicity within normal limits. No mass or hydronephrosis visualized. Abdominal aorta: No aneurysm visualized. Other findings: Bilateral pleural effusions identified. IMPRESSION: 1. No acute findings. 2. Mildly echogenic liver compatible with hepatic steatosis. 3. No evidence for splenomegaly. Electronically Signed   By: Kerby Moors M.D.   On: 10/15/2015 10:28   Dg Abd Acute W/chest  10/14/2015  CLINICAL DATA:  Acute onset of altered mental status and hematemesis. Generalized abdominal pain. Initial encounter. EXAM: DG ABDOMEN ACUTE W/ 1V CHEST COMPARISON:  Chest radiograph performed 10/18/2014, and abdominal radiographs performed 01/06/2012 FINDINGS: The lungs are well-aerated and clear. There is no evidence of focal opacification, pleural effusion or pneumothorax. The cardiomediastinal silhouette is within normal limits. The visualized bowel gas pattern is unremarkable. Scattered stool and air are seen within the colon; there is no evidence of small bowel dilatation to suggest obstruction. No free intra-abdominal air is identified on the provided upright view. No acute osseous abnormalities are seen; the sacroiliac joints are unremarkable in appearance. Degenerative change is noted along the  lower lumbar spine. IMPRESSION: 1. Unremarkable bowel gas pattern; no free intra-abdominal air seen. Small to moderate amount of stool noted in the colon. 2. No acute cardiopulmonary process seen. Electronically Signed   By: Garald Balding M.D.   On:  10/14/2015 06:19   Mm Digital Screening Bilateral  10/07/2015  CLINICAL DATA:  Screening. EXAM: DIGITAL SCREENING BILATERAL MAMMOGRAM WITH CAD COMPARISON:  Previous exam(s). ACR Breast Density Category b: There are scattered areas of fibroglandular density. FINDINGS: There are no findings suspicious for malignancy. Images were processed with CAD. IMPRESSION: No mammographic evidence of malignancy. A result letter of this screening mammogram will be mailed directly to the patient. RECOMMENDATION: Screening mammogram in one year. (Code:SM-B-01Y) BI-RADS CATEGORY  1: Negative. Electronically Signed   By: Claudie Revering M.D.   On: 10/07/2015 17:35  [2 weeks]   Assessment: 59 year old female with history of ETOH abuse, severe erosive esophagitis documented on past EGD in Jan 2016, presenting with recurrent hematemesis and abdominal pain in the setting of recent alcohol intake (alcohol level markedly elevated at 166). She also endorses NSAID use as outpatient. She has no known liver disease and no evidence of varices or stigmata of liver disease on recent EGD in Jan 2016. Abdominal u/s without evidence of cirrhosis.Hematemesis most likely secondary to Mallory-Weiss tear with other differentials to include esophagitis, possible NSAID-induced gastritis/ulcer disease. H/H has been very stable last 36 hours.   Hypokalemia per attending.   Elevated AST: consistent with alcoholic hepatitis. Improving.  Anemia: stable. No signs of overt GI bleeding.   Plan: PPI BID. Encourage etoh cessation.  Advance diet.  Office visit in 4-6 weeks.  Laureen Ochs. Bernarda Caffey [facility] [phone] 12/29/201610:11 AM      LOS: 2 days

## 2015-10-16 NOTE — Evaluation (Addendum)
Physical Therapy Evaluation Patient Details Name: [name] MRN: [id number] DOB: 03/27/1956 Today's Date: 10/16/2015   History of Present Illness  [name] is a 59 y.o. female history of seizures on Dilantin and phenobarbital, hypertension, peptic ulcer disease, esophagitis, alcohol abuse who presents to the ED with complaints of hematemesis onset last night. Reports vomiting 4x. Blood was noted after multiple episodes. Admits chest pain secondary to deep breath, generalized abdominal pain, and alcohol use (last drink 12/26). Denies hematochezia, fever, lightheaded, dizziness, NSAID use. While in the ED potassium noted to be <2.0, EtOH 166, and report coffee-ground emesis. Admitted for further evaluation and monitoring.  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  She lives with her boyfriend and has an aide 3x/week for several hours each time.  Pt states that she is feeling very unsteady when walking due to right sided weakness.  Clinical Impression   Pt was seen for evaluation.  She was alert and oriented, very cooperative.  She has no use of the RUE due to spastic paralysis and significant weakness of the RLE (2+/5).  Her gait with no assistive device is quite unstable.  We tested her gait with both a quad cane and a hemi walker.  It was determined that her gait was most stable with the hemi walker.  I am recommending HHPT to maximize her general strength.    Follow Up Recommendations Home health PT    Equipment Recommendations  Other (comment);3in1 (PT) (hemi walker)    Recommendations for Other Services   none    Precautions / Restrictions Precautions Precautions: Fall Restrictions Weight Bearing Restrictions: No      Mobility  Bed Mobility               General bed mobility comments: not tested  Transfers Overall transfer level: Needs assistance Equipment used: 1 person hand held assist Transfers: Sit to/from Stand Sit  to Stand: Min guard            Ambulation/Gait Ambulation/Gait assistance: Supervision Ambulation Distance (Feet): 175 Feet Assistive device: Hemi-walker;Quad cane Gait Pattern/deviations: WFL(Within Functional Limits) Gait velocity: appropriate for situation Gait velocity interpretation: <1.8 ft/sec, indicative of risk for recurrent falls    Stairs            Wheelchair Mobility    Modified Rankin (Stroke Patients Only)       Balance Overall balance assessment: No apparent balance deficits (not formally assessed)                                           Pertinent Vitals/Pain Pain Assessment: No/denies pain    Home Living Family/patient expects to be discharged to:: Private residence Living Arrangements: Non-relatives/Friends Available Help at Discharge: Personal care attendant;Friend(s);Available PRN/intermittently Type of Home: Mobile home Home Access: Ramped entrance     Home Layout: One level Home Equipment: Shower seat      Prior Function Level of Independence: Needs assistance   Gait / Transfers Assistance Needed: ambulates short distances with no assistive device  ADL's / Homemaking Assistance Needed: assist with bathing and household activities        Hand Dominance        Extremity/Trunk Assessment   Upper Extremity Assessment: RUE deficits/detail RUE Deficits / Details: 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 female history of seizures on Dilantin and phenobarbital, hypertension, peptic ulcer disease, esophagitis, alcohol abuse who presents to the ED with complaints of hematemesis onset last night. Reports vomiting 4x. Blood was noted after multiple episodes. Admits chest pain secondary to deep breath, generalized abdominal pain, and alcohol use (last drink 12/26). Denies hematochezia, fever, lightheaded, dizziness, NSAID use. While in the ED potassium noted to be <2.0, EtOH 166, and report coffee-ground emesis. Admitted for further evaluation and monitoring.  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  She lives with her boyfriend and has an aide 3x/week for several hours each time.  Pt states that she is feeling very unsteady when walking due to right sided weakness.  Clinical Impression   Pt was seen for evaluation.  She was alert and oriented, very cooperative.  She has no use of the RUE due to spastic paralysis and significant weakness of the RLE (2+/5).  Her gait with no assistive device is quite unstable.  We tested her gait with both a quad cane and a hemi walker.  It was determined that her gait was most stable with the hemi walker.  I am recommending HHPT to maximize her general strength.    Follow Up Recommendations Home health PT    Equipment Recommendations  Other (comment);3in1 (PT) (hemi walker)    Recommendations for Other Services   none    Precautions / Restrictions Precautions Precautions: Fall Restrictions Weight Bearing Restrictions: No      Mobility  Bed Mobility               General bed mobility comments: not tested  Transfers Overall transfer level: Needs assistance Equipment used: 1 person hand held assist Transfers: Sit to/from Stand Sit  to Stand: Min guard            Ambulation/Gait Ambulation/Gait assistance: Supervision Ambulation Distance (Feet): 175 Feet Assistive device: Hemi-walker;Quad cane Gait Pattern/deviations: WFL(Within Functional Limits) Gait velocity: appropriate for situation Gait velocity interpretation: <1.8 ft/sec, indicative of risk for recurrent falls    Stairs            Wheelchair Mobility    Modified Rankin (Stroke Patients Only)       Balance Overall balance assessment: No apparent balance deficits (not formally assessed)                                           Pertinent Vitals/Pain Pain Assessment: No/denies pain    Home Living Family/patient expects to be discharged to:: Private residence Living Arrangements: Non-relatives/Friends Available Help at Discharge: Personal care attendant;Friend(s);Available PRN/intermittently Type of Home: Mobile home Home Access: Ramped entrance     Home Layout: One level Home Equipment: Shower seat      Prior Function Level of Independence: Needs assistance   Gait / Transfers Assistance Needed: ambulates short distances with no assistive device  ADL's / Homemaking Assistance Needed: assist with bathing and household activities        Hand Dominance        Extremity/Trunk Assessment   Upper Extremity Assessment: RUE deficits/detail RUE Deficits / Details: 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         

## 2015-10-16 NOTE — Progress Notes (Signed)
Subjective: Patient is resting. Her hematemesis is subsding. She is tolerating her diet.  Objective: Vital signs in last 24 hours: Temp:  [98.3 F (36.8 C)-98.9 F (37.2 C)] 98.3 F (36.8 C) (12/29 0400) Pulse Rate:  [79-101] 79 (12/29 0700) Resp:  [12-23] 20 (12/29 0600) BP: (99-178)/(57-108) 139/67 mmHg (12/29 0700) SpO2:  [92 %-100 %] 100 % (12/29 0600) Weight change:  Last BM Date: 10/13/15  Intake/Output from previous day: 12/28 0701 - 12/29 0700 In: 1604.6 [P.O.:120; I.V.:1484.6] Out: 4000 [Urine:4000]  PHYSICAL EXAM General appearance: alert and no distress Resp: clear to auscultation bilaterally Cardio: S1, S2 normal GI: soft, non-tender; bowel sounds normal; no masses,  no organomegaly Extremities: extremities normal, atraumatic, no cyanosis or edema  Lab Results:  Results for orders placed or performed during the hospital encounter of 10/14/15 (from the past 48 hour(s))  MRSA PCR Screening     Status: None   Collection Time: 10/14/15  9:00 AM  Result Value Ref Range   MRSA by PCR NEGATIVE NEGATIVE    Comment:        The GeneXpert MRSA Assay (FDA approved for NASAL specimens only), is one component of a comprehensive MRSA colonization surveillance program. It is not intended to diagnose MRSA infection nor to guide or monitor treatment for MRSA infections.   CBC     Status: Abnormal   Collection Time: 10/14/15  9:10 AM  Result Value Ref Range   WBC 9.2 4.0 - 10.5 K/uL   RBC 3.86 (L) 3.87 - 5.11 MIL/uL   Hemoglobin 12.2 12.0 - 15.0 g/dL    Comment: DELTA CHECK NOTED RESULT REPEATED AND VERIFIED    HCT 35.2 (L) 36.0 - 46.0 %   MCV 91.2 78.0 - 100.0 fL   MCH 31.6 26.0 - 34.0 pg   MCHC 34.7 30.0 - 36.0 g/dL   RDW 13.2 11.5 - 15.5 %   Platelets 145 (L) 150 - 400 K/uL  Protime-INR     Status: Abnormal   Collection Time: 10/14/15  9:10 AM  Result Value Ref Range   Prothrombin Time 15.8 (H) 11.6 - 15.2 seconds   INR 1.24 0.00 - 1.49  Urinalysis,  Routine w reflex microscopic (not at [facility])     Status: Abnormal   Collection Time: 10/14/15 10:45 AM  Result Value Ref Range   Color, Urine YELLOW YELLOW   APPearance CLEAR CLEAR   Specific Gravity, Urine 1.010 1.005 - 1.030   pH 6.5 5.0 - 8.0   Glucose, UA NEGATIVE NEGATIVE mg/dL   Hgb urine dipstick TRACE (A) NEGATIVE   Bilirubin Urine NEGATIVE NEGATIVE   Ketones, ur 15 (A) NEGATIVE mg/dL   Protein, ur NEGATIVE NEGATIVE mg/dL   Nitrite NEGATIVE NEGATIVE   Leukocytes, UA NEGATIVE NEGATIVE  Urine rapid drug screen (hosp performed)     Status: Abnormal   Collection Time: 10/14/15 10:45 AM  Result Value Ref Range   Opiates NONE DETECTED NONE DETECTED   Cocaine NONE DETECTED NONE DETECTED   Benzodiazepines NONE DETECTED NONE DETECTED   Amphetamines NONE DETECTED NONE DETECTED   Tetrahydrocannabinol NONE DETECTED NONE DETECTED   Barbiturates POSITIVE (A) NONE DETECTED    Comment:        DRUG SCREEN FOR MEDICAL PURPOSES ONLY.  IF CONFIRMATION IS NEEDED FOR ANY PURPOSE, NOTIFY LAB WITHIN 5 DAYS.        LOWEST DETECTABLE LIMITS FOR URINE DRUG SCREEN Drug Class       Cutoff (ng/mL) Amphetamine  1000 Barbiturate      200 Benzodiazepine   130 Tricyclics       865 Opiates          300 Cocaine          300 THC              50   Urine microscopic-add on     Status: Abnormal   Collection Time: 10/14/15 10:45 AM  Result Value Ref Range   Squamous Epithelial / LPF 0-5 (A) NONE SEEN   WBC, UA NONE SEEN 0 - 5 WBC/hpf   RBC / HPF 0-5 0 - 5 RBC/hpf   Bacteria, UA NONE SEEN NONE SEEN  CBC     Status: Abnormal   Collection Time: 10/14/15  4:43 PM  Result Value Ref Range   WBC 7.0 4.0 - 10.5 K/uL   RBC 3.31 (L) 3.87 - 5.11 MIL/uL   Hemoglobin 10.6 (L) 12.0 - 15.0 g/dL   HCT 30.3 (L) 36.0 - 46.0 %   MCV 91.5 78.0 - 100.0 fL   MCH 32.0 26.0 - 34.0 pg   MCHC 35.0 30.0 - 36.0 g/dL   RDW 13.2 11.5 - 15.5 %   Platelets 136 (L) 150 - 400 K/uL  CBC     Status: Abnormal   Collection  Time: 10/15/15 12:37 AM  Result Value Ref Range   WBC 6.0 4.0 - 10.5 K/uL   RBC 3.41 (L) 3.87 - 5.11 MIL/uL   Hemoglobin 10.8 (L) 12.0 - 15.0 g/dL   HCT 31.2 (L) 36.0 - 46.0 %   MCV 91.5 78.0 - 100.0 fL   MCH 31.7 26.0 - 34.0 pg   MCHC 34.6 30.0 - 36.0 g/dL   RDW 13.3 11.5 - 15.5 %   Platelets 123 (L) 150 - 400 K/uL  Comprehensive metabolic panel     Status: Abnormal   Collection Time: 10/15/15  4:23 AM  Result Value Ref Range   Sodium 136 135 - 145 mmol/L   Potassium 3.1 (L) 3.5 - 5.1 mmol/L    Comment: DELTA CHECK NOTED   Chloride 101 101 - 111 mmol/L   CO2 28 22 - 32 mmol/L   Glucose, Bld 85 65 - 99 mg/dL   BUN 9 6 - 20 mg/dL   Creatinine, Ser 0.60 0.44 - 1.00 mg/dL   Calcium 8.0 (L) 8.9 - 10.3 mg/dL   Total Protein 5.8 (L) 6.5 - 8.1 g/dL   Albumin 2.8 (L) 3.5 - 5.0 g/dL   AST 49 (H) 15 - 41 U/L   ALT 24 14 - 54 U/L   Alkaline Phosphatase 55 38 - 126 U/L   Total Bilirubin 1.7 (H) 0.3 - 1.2 mg/dL   GFR calc non Af Amer >60 >60 mL/min   GFR calc Af Amer >60 >60 mL/min    Comment: (NOTE) The eGFR has been calculated using the CKD EPI equation. This calculation has not been validated in all clinical situations. eGFR's persistently <60 mL/min signify possible Chronic Kidney Disease.    Anion gap 7 5 - 15  CBC     Status: Abnormal   Collection Time: 10/15/15  7:55 AM  Result Value Ref Range   WBC 5.2 4.0 - 10.5 K/uL   RBC 3.56 (L) 3.87 - 5.11 MIL/uL   Hemoglobin 11.2 (L) 12.0 - 15.0 g/dL   HCT 33.1 (L) 36.0 - 46.0 %   MCV 93.0 78.0 - 100.0 fL   MCH 31.5 26.0 - 34.0 pg  MCHC 33.8 30.0 - 36.0 g/dL   RDW 13.4 11.5 - 15.5 %   Platelets 124 (L) 150 - 400 K/uL  Hepatitis C antibody     Status: None   Collection Time: 10/15/15  7:55 AM  Result Value Ref Range   HCV Ab <0.1 0.0 - 0.9 s/co ratio    Comment: (NOTE)                                  Negative:     < 0.8                             Indeterminate: 0.8 - 0.9                                  Positive:     >  0.9 The CDC recommends that a positive HCV antibody result be followed up with a HCV Nucleic Acid Amplification test ([id number]). Performed At: [facility] [place], [place] [id number] Lindon Romp MD UR:[id number]   CBC     Status: Abnormal   Collection Time: 10/16/15  4:07 AM  Result Value Ref Range   WBC 5.0 4.0 - 10.5 K/uL   RBC 3.43 (L) 3.87 - 5.11 MIL/uL   Hemoglobin 10.7 (L) 12.0 - 15.0 g/dL   HCT 31.8 (L) 36.0 - 46.0 %   MCV 92.7 78.0 - 100.0 fL   MCH 31.2 26.0 - 34.0 pg   MCHC 33.6 30.0 - 36.0 g/dL   RDW 12.6 11.5 - 15.5 %   Platelets 111 (L) 150 - 400 K/uL    Comment: SPECIMEN CHECKED FOR CLOTS PLATELET COUNT CONFIRMED BY SMEAR     ABGS  Recent Labs  10/14/15 0632  TCO2 36   CULTURES Recent Results (from the past 240 hour(s))  MRSA PCR Screening     Status: None   Collection Time: 10/14/15  9:00 AM  Result Value Ref Range Status   MRSA by PCR NEGATIVE NEGATIVE Final    Comment:        The GeneXpert MRSA Assay (FDA approved for NASAL specimens only), is one component of a comprehensive MRSA colonization surveillance program. It is not intended to diagnose MRSA infection nor to guide or monitor treatment for MRSA infections.    Studies/Results: US Abdomen Complete  10/15/2015  CLINICAL DATA:  Alcohol abuse. EXAM: ABDOMEN ULTRASOUND COMPLETE COMPARISON:  10/14/2015 FINDINGS: Gallbladder: No gallstones or wall thickening visualized. No sonographic Murphy sign noted by sonographer. Common bile duct: Diameter: 2 mm. Liver: No focal lesion identified. Within normal limits in parenchymal echogenicity. IVC: No abnormality visualized. Pancreas: Visualized portion unremarkable. Spleen: The spleen measures 6 cm in length and has a volume of 127.2 cubic cm. Right Kidney: Length: 12.1 cm. Echogenicity within normal limits. No mass or hydronephrosis visualized. Left Kidney: Length: 12.7 cm. Echogenicity within normal limits. No mass or  hydronephrosis visualized. Abdominal aorta: No aneurysm visualized. Other findings: Bilateral pleural effusions identified. IMPRESSION: 1. No acute findings. 2. Mildly echogenic liver compatible with hepatic steatosis. 3. No evidence for splenomegaly. Electronically Signed   By: Kerby Moors M.D.   On: 10/15/2015 10:28    Medications: I have reviewed the patient's current medications.  Assesment:   Active Problems:   Alcohol abuse   GERD (gastroesophageal  reflux disease)   Sinus tachycardia (HCC)   Hypokalemia   Seizure disorder (HCC)   Hematemesis   GI bleed   ETOH abuse   Thrombocytopenia (HCC)    Plan:  Medications reviewed As per GI recommendation. Continue current treatment Will monitor CBC    LOS: 2 days   , 10/16/2015, 8:16 AM

## 2015-10-17 DIAGNOSIS — K920 Hematemesis: Secondary | ICD-10-CM | POA: Diagnosis not present

## 2015-10-17 DIAGNOSIS — R7989 Other specified abnormal findings of blood chemistry: Secondary | ICD-10-CM | POA: Diagnosis not present

## 2015-10-17 DIAGNOSIS — D649 Anemia, unspecified: Secondary | ICD-10-CM | POA: Diagnosis not present

## 2015-10-17 DIAGNOSIS — J449 Chronic obstructive pulmonary disease, unspecified: Secondary | ICD-10-CM | POA: Diagnosis not present

## 2015-10-17 DIAGNOSIS — F1010 Alcohol abuse, uncomplicated: Secondary | ICD-10-CM | POA: Diagnosis not present

## 2015-10-17 DIAGNOSIS — F1020 Alcohol dependence, uncomplicated: Secondary | ICD-10-CM | POA: Diagnosis not present

## 2015-10-17 DIAGNOSIS — R110 Nausea: Secondary | ICD-10-CM | POA: Diagnosis not present

## 2015-10-17 DIAGNOSIS — K922 Gastrointestinal hemorrhage, unspecified: Secondary | ICD-10-CM | POA: Diagnosis not present

## 2015-10-17 DIAGNOSIS — I639 Cerebral infarction, unspecified: Secondary | ICD-10-CM | POA: Diagnosis not present

## 2015-10-17 LAB — CBC
HCT: 30.8 % — ABNORMAL LOW (ref 36.0–46.0)
Hemoglobin: 10.3 g/dL — ABNORMAL LOW (ref 12.0–15.0)
MCH: 31.2 pg (ref 26.0–34.0)
MCHC: 33.4 g/dL (ref 30.0–36.0)
MCV: 93.3 fL (ref 78.0–100.0)
Platelets: 134 10*3/uL — ABNORMAL LOW (ref 150–400)
RBC: 3.3 MIL/uL — ABNORMAL LOW (ref 3.87–5.11)
RDW: 13.7 % (ref 11.5–15.5)
WBC: 4.7 10*3/uL (ref 4.0–10.5)

## 2015-10-17 NOTE — Progress Notes (Signed)
REVIEWED-NO ADDITIONAL RECOMMENDATIONS.   Subjective: No overt GI bleeding. Tolerating diet. No N/V.   Objective: Vital signs in last 24 hours: Temp:  [96.7 F (35.9 C)-98.6 F (37 C)] 96.7 F (35.9 C) (12/30 0810) Pulse Rate:  [91-106] 92 (12/29 2000) Resp:  [16-23] 20 (12/30 0606) BP: (110-172)/(61-112) 166/81 mmHg (12/30 0923) SpO2:  [95 %-100 %] 100 % (12/29 2000) Weight:  [148 lb 2.4 oz (67.2 kg)] 148 lb 2.4 oz (67.2 kg) (12/30 0600) Last BM Date: 10/16/15 General:   Alert and oriented, pleasant Abdomen:  Bowel sounds present, soft, non-tender, non-distended.  No rebound or guarding. No masses appreciated  Neurologic:  Alert and  oriented x4;  grossly normal neurologically. Psych:  Alert and cooperative. Normal mood and affect.  Intake/Output from previous day: 12/29 0701 - 12/30 0700 In: 480 [P.O.:480] Out: 1900 [Urine:1900] Intake/Output this shift: Total I/O In: 240 [P.O.:240] Out: 500 [Urine:500]  Lab Results:  Recent Labs  10/15/15 0755 10/16/15 0407 10/17/15 0404  WBC 5.2 5.0 4.7  HGB 11.2* 10.7* 10.3*  HCT 33.1* 31.8* 30.8*  PLT 124* 111* 134*   BMET  Recent Labs  10/15/15 0423  NA 136  K 3.1*  CL 101  CO2 28  GLUCOSE 85  BUN 9  CREATININE 0.60  CALCIUM 8.0*   LFT  Recent Labs  10/15/15 0423  PROT 5.8*  ALBUMIN 2.8*  AST 49*  ALT 24  ALKPHOS 55  BILITOT 1.7*   Hepatitis Panel  Recent Labs  10/15/15 0755  HCVAB <0.1     Studies/Results: US Abdomen Complete  10/15/2015  CLINICAL DATA:  Alcohol abuse. EXAM: ABDOMEN ULTRASOUND COMPLETE COMPARISON:  10/14/2015 FINDINGS: Gallbladder: No gallstones or wall thickening visualized. No sonographic Murphy sign noted by sonographer. Common bile duct: Diameter: 2 mm. Liver: No focal lesion identified. Within normal limits in parenchymal echogenicity. IVC: No abnormality visualized. Pancreas: Visualized portion unremarkable. Spleen: The spleen measures 6 cm in length and has a volume of  127.2 cubic cm. Right Kidney: Length: 12.1 cm. Echogenicity within normal limits. No mass or hydronephrosis visualized. Left Kidney: Length: 12.7 cm. Echogenicity within normal limits. No mass or hydronephrosis visualized. Abdominal aorta: No aneurysm visualized. Other findings: Bilateral pleural effusions identified. IMPRESSION: 1. No acute findings. 2. Mildly echogenic liver compatible with hepatic steatosis. 3. No evidence for splenomegaly. Electronically Signed   By: Kerby Moors M.D.   On: 10/15/2015 10:28    Assessment: 59 year old female with history of ETOH abuse, severe erosive esophagitis documented on past EGD in Jan 2016, presenting with recurrent hematemesis and abdominal pain in the setting of recent alcohol intake (alcohol level markedly elevated at 166). She also endorses NSAID use as outpatient. She has no known liver disease and no evidence of varices or stigmata of liver disease on recent EGD in Jan 2016. Abdominal u/s without evidence of cirrhosis.Hematemesis most likely secondary to Mallory-Weiss tear with other differentials to include esophagitis, possible NSAID-induced gastritis/ulcer disease. H/H has remained stable and without overt GI bleeding. From a GI standpoint, appropriate for discharge home. We will sign off and follow-up as outpatient.    Plan: PPI BID ETOH cessation Office visit in 4-6 weeks Appropriate for discharge home Signing off.  Orvil Feil, ANP-BC [facility]    LOS: 3 days    10/17/2015, 9:59 AM

## 2015-10-17 NOTE — Care Management Note (Signed)
Case Management Note  Patient Details  Name: [name] MRN: [id number] Date of Birth: September 12, 1956  Expected Discharge Date:                  Expected Discharge Plan:  [place]  In-House Referral:  NA  Discharge planning Services  CM Consult  Post Acute Care Choice:  Durable Medical Equipment, Home Health Choice offered to:  Patient  DME Arranged:  Bedside commode, Walker hemi DME Agency:  [facility]  HH Arranged:  PT, RN [place] Agency:  [facility]  Status of Service:  Completed, signed off  Medicare Important Message Given:    Date Medicare IM Given:    Medicare IM give by:    Date Additional Medicare IM Given:    Additional Medicare Important Message give by:     If discussed at [place] of Stay Meetings, dates discussed:    Additional Comments: Pt discharged home today. [facility], made aware of referral and will obtain pt info from chart. Pt aware HH has 48 hours to initiate services. Pt's DME orders faxed to [facility] and call made to verify receipt of fax. DME will be delivered to pt's home. Pt has no questions or concerns about DC plan. No further CM needs.   Sherald Barge, RN 10/17/2015, 3:24 PM

## 2015-10-17 NOTE — Progress Notes (Signed)
Alert and oriented. Vital signs stable. Saline lock removed. Telemetry D/C'ed. Discharge instructions given. Patient verbalized understanding of instructions. Left floor via wheelchair with nursing staff and family member.  Discharged home.  Noreene Larsson 10/17/2015 11:23 AM

## 2015-10-20 NOTE — Discharge Summary (Signed)
Physician Discharge Summary  Patient ID: [name] MRN: [id number] DOB/AGE: 1956-06-10 60 y.o. Primary Care Physician:Johnelle Tafolla, MD Admit date: 10/14/2015 Discharge date:10/17/15    Discharge Diagnoses:   Active Problems:   Alcohol abuse   GERD (gastroesophageal reflux disease)   Sinus tachycardia (HCC)   Hypokalemia   Seizure disorder (HCC)   Hematemesis   GI bleed   ETOH abuse   Thrombocytopenia (HCC)   Abnormal LFTs   Hematemesis with nausea   GIB (gastrointestinal bleeding)     Medication List    TAKE these medications        albuterol 108 (90 Base) MCG/ACT inhaler  Commonly known as:  PROVENTIL HFA;VENTOLIN HFA  Inhale 2 puffs into the lungs every 6 (six) hours as needed. FOR SHORTNESS OF BREATH AND/OR WHEEZING     HYDROcodone-acetaminophen 7.5-325 MG tablet  Commonly known as:  NORCO  Take 1 tablet by mouth every 4 (four) hours as needed for moderate pain (FOR 30 DAYS).     ipratropium-albuterol 0.5-2.5 (3) MG/3ML Soln  Commonly known as:  DUONEB  Take 3 mLs by nebulization every 6 (six) hours as needed (for shortness of breath/wheezing).     lisinopril-hydrochlorothiazide 20-12.5 MG tablet  Commonly known as:  PRINZIDE,ZESTORETIC  Take 1 tablet by mouth daily.     omeprazole 20 MG capsule  Commonly known as:  PRILOSEC  Take 20 mg by mouth daily.     phenytoin 100 MG ER capsule  Commonly known as:  DILANTIN  Take 100 mg by mouth 3 (three) times daily.     solifenacin 5 MG tablet  Commonly known as:  VESICARE  Take 5 mg by mouth daily.        Discharged Condition: improved    Consults: Significant Diagnostic Studies: US Abdomen Complete  10/15/2015  CLINICAL DATA:  Alcohol abuse. EXAM: ABDOMEN ULTRASOUND COMPLETE COMPARISON:  10/14/2015 FINDINGS: Gallbladder: No gallstones or wall thickening visualized. No sonographic Murphy sign noted by sonographer. Common bile duct: Diameter: 2 mm. Liver: No focal lesion identified. Within  normal limits in parenchymal echogenicity. IVC: No abnormality visualized. Pancreas: Visualized portion unremarkable. Spleen: The spleen measures 6 cm in length and has a volume of 127.2 cubic cm. Right Kidney: Length: 12.1 cm. Echogenicity within normal limits. No mass or hydronephrosis visualized. Left Kidney: Length: 12.7 cm. Echogenicity within normal limits. No mass or hydronephrosis visualized. Abdominal aorta: No aneurysm visualized. Other findings: Bilateral pleural effusions identified. IMPRESSION: 1. No acute findings. 2. Mildly echogenic liver compatible with hepatic steatosis. 3. No evidence for splenomegaly. Electronically Signed   By: Signa Kell M.D.   On: 10/15/2015 10:28   Dg Abd Acute W/chest  10/14/2015  CLINICAL DATA:  Acute onset of altered mental status and hematemesis. Generalized abdominal pain. Initial encounter. EXAM: DG ABDOMEN ACUTE W/ 1V CHEST COMPARISON:  Chest radiograph performed 10/18/2014, and abdominal radiographs performed 01/06/2012 FINDINGS: The lungs are well-aerated and clear. There is no evidence of focal opacification, pleural effusion or pneumothorax. The cardiomediastinal silhouette is within normal limits. The visualized bowel gas pattern is unremarkable. Scattered stool and air are seen within the colon; there is no evidence of small bowel dilatation to suggest obstruction. No free intra-abdominal air is identified on the provided upright view. No acute osseous abnormalities are seen; the sacroiliac joints are unremarkable in appearance. Degenerative change is noted along the lower lumbar spine. IMPRESSION: 1. Unremarkable bowel gas pattern; no free intra-abdominal air seen. Small to moderate amount of stool noted in the colon.  2. No acute cardiopulmonary process seen. Electronically Signed   By: Roanna Raider M.D.   On: 10/14/2015 06:19   Mm Digital Screening Bilateral  10/07/2015  CLINICAL DATA:  Screening. EXAM: DIGITAL SCREENING BILATERAL MAMMOGRAM WITH  CAD COMPARISON:  Previous exam(s). ACR Breast Density Category b: There are scattered areas of fibroglandular density. FINDINGS: There are no findings suspicious for malignancy. Images were processed with CAD. IMPRESSION: No mammographic evidence of malignancy. A result letter of this screening mammogram will be mailed directly to the patient. RECOMMENDATION: Screening mammogram in one year. (Code:SM-B-01Y) BI-RADS CATEGORY  1: Negative. Electronically Signed   By: Beckie Salts M.D.   On: 10/07/2015 17:35    Lab Results: Basic Metabolic Panel: No results for input(s): NA, K, CL, CO2, GLUCOSE, BUN, CREATININE, CALCIUM, MG, PHOS in the last 72 hours. Liver Function Tests: No results for input(s): AST, ALT, ALKPHOS, BILITOT, PROT, ALBUMIN in the last 72 hours.   CBC: No results for input(s): WBC, NEUTROABS, HGB, HCT, MCV, PLT in the last 72 hours.  Recent Results (from the past 240 hour(s))  MRSA PCR Screening     Status: None   Collection Time: 10/14/15  9:00 AM  Result Value Ref Range Status   MRSA by PCR NEGATIVE NEGATIVE Final    Comment:        The GeneXpert MRSA Assay (FDA approved for NASAL specimens only), is one component of a comprehensive MRSA colonization surveillance program. It is not intended to diagnose MRSA infection nor to guide or monitor treatment for MRSA infections.      Hospital Course:  This is a 61 years old female with history of multiple medical illnesses including alcohol abuse was admitted due to hematemesis. Patient has been drinking alcohol heavily. Patient was seen by GI and GI bleed  Was thought to be secondary  To alcoholic gastritis. Patient was evaluated by gastroenterologist and was treated medically. Patient improved and discharged in stable condition. Patient was advised to stop     Discharge Exam: Blood pressure 166/81, pulse 92, temperature 96.7 F (35.9 C), temperature source Axillary, resp. rate 20, height 5\' 4"  (1.626 m), weight 67.2 kg  (148 lb 2.4 oz), SpO2 100 %.   Disposition:home        Follow-up Information    Follow up with [facility].   Contact information:   [address] [place] [address] [phone]       Follow up with [facility], [place] In 1 week.   Specialty:  Internal Medicine   Contact information:   [address] [place] [address] [phone]       Signed: Avon Gully   10/20/2015, 9:01 PM

## 2015-10-23 DIAGNOSIS — F1010 Alcohol abuse, uncomplicated: Secondary | ICD-10-CM | POA: Diagnosis not present

## 2015-10-23 DIAGNOSIS — I10 Essential (primary) hypertension: Secondary | ICD-10-CM | POA: Diagnosis not present

## 2015-10-23 DIAGNOSIS — G40909 Epilepsy, unspecified, not intractable, without status epilepticus: Secondary | ICD-10-CM | POA: Diagnosis not present

## 2015-10-23 DIAGNOSIS — K279 Peptic ulcer, site unspecified, unspecified as acute or chronic, without hemorrhage or perforation: Secondary | ICD-10-CM | POA: Diagnosis not present

## 2015-10-23 DIAGNOSIS — K219 Gastro-esophageal reflux disease without esophagitis: Secondary | ICD-10-CM | POA: Diagnosis not present

## 2015-10-23 DIAGNOSIS — Z8673 Personal history of transient ischemic attack (TIA), and cerebral infarction without residual deficits: Secondary | ICD-10-CM | POA: Diagnosis not present

## 2015-10-23 DIAGNOSIS — K922 Gastrointestinal hemorrhage, unspecified: Secondary | ICD-10-CM | POA: Diagnosis not present

## 2015-10-28 DIAGNOSIS — I10 Essential (primary) hypertension: Secondary | ICD-10-CM | POA: Diagnosis not present

## 2015-10-28 DIAGNOSIS — K219 Gastro-esophageal reflux disease without esophagitis: Secondary | ICD-10-CM | POA: Diagnosis not present

## 2015-10-28 DIAGNOSIS — K922 Gastrointestinal hemorrhage, unspecified: Secondary | ICD-10-CM | POA: Diagnosis not present

## 2015-10-28 DIAGNOSIS — G40909 Epilepsy, unspecified, not intractable, without status epilepticus: Secondary | ICD-10-CM | POA: Diagnosis not present

## 2015-10-28 DIAGNOSIS — K279 Peptic ulcer, site unspecified, unspecified as acute or chronic, without hemorrhage or perforation: Secondary | ICD-10-CM | POA: Diagnosis not present

## 2015-10-28 DIAGNOSIS — F1010 Alcohol abuse, uncomplicated: Secondary | ICD-10-CM | POA: Diagnosis not present

## 2015-10-28 DIAGNOSIS — Z8673 Personal history of transient ischemic attack (TIA), and cerebral infarction without residual deficits: Secondary | ICD-10-CM | POA: Diagnosis not present

## 2015-10-30 DIAGNOSIS — F1010 Alcohol abuse, uncomplicated: Secondary | ICD-10-CM | POA: Diagnosis not present

## 2015-10-30 DIAGNOSIS — K279 Peptic ulcer, site unspecified, unspecified as acute or chronic, without hemorrhage or perforation: Secondary | ICD-10-CM | POA: Diagnosis not present

## 2015-10-30 DIAGNOSIS — I10 Essential (primary) hypertension: Secondary | ICD-10-CM | POA: Diagnosis not present

## 2015-10-30 DIAGNOSIS — G40909 Epilepsy, unspecified, not intractable, without status epilepticus: Secondary | ICD-10-CM | POA: Diagnosis not present

## 2015-10-30 DIAGNOSIS — K219 Gastro-esophageal reflux disease without esophagitis: Secondary | ICD-10-CM | POA: Diagnosis not present

## 2015-10-30 DIAGNOSIS — Z8673 Personal history of transient ischemic attack (TIA), and cerebral infarction without residual deficits: Secondary | ICD-10-CM | POA: Diagnosis not present

## 2015-10-30 DIAGNOSIS — K922 Gastrointestinal hemorrhage, unspecified: Secondary | ICD-10-CM | POA: Diagnosis not present

## 2015-11-03 DIAGNOSIS — K279 Peptic ulcer, site unspecified, unspecified as acute or chronic, without hemorrhage or perforation: Secondary | ICD-10-CM | POA: Diagnosis not present

## 2015-11-03 DIAGNOSIS — G40909 Epilepsy, unspecified, not intractable, without status epilepticus: Secondary | ICD-10-CM | POA: Diagnosis not present

## 2015-11-03 DIAGNOSIS — Z8673 Personal history of transient ischemic attack (TIA), and cerebral infarction without residual deficits: Secondary | ICD-10-CM | POA: Diagnosis not present

## 2015-11-03 DIAGNOSIS — I10 Essential (primary) hypertension: Secondary | ICD-10-CM | POA: Diagnosis not present

## 2015-11-03 DIAGNOSIS — K922 Gastrointestinal hemorrhage, unspecified: Secondary | ICD-10-CM | POA: Diagnosis not present

## 2015-11-03 DIAGNOSIS — K219 Gastro-esophageal reflux disease without esophagitis: Secondary | ICD-10-CM | POA: Diagnosis not present

## 2015-11-03 DIAGNOSIS — F1010 Alcohol abuse, uncomplicated: Secondary | ICD-10-CM | POA: Diagnosis not present

## 2015-11-04 DIAGNOSIS — I10 Essential (primary) hypertension: Secondary | ICD-10-CM | POA: Diagnosis not present

## 2015-11-04 DIAGNOSIS — F1010 Alcohol abuse, uncomplicated: Secondary | ICD-10-CM | POA: Diagnosis not present

## 2015-11-04 DIAGNOSIS — G40909 Epilepsy, unspecified, not intractable, without status epilepticus: Secondary | ICD-10-CM | POA: Diagnosis not present

## 2015-11-04 DIAGNOSIS — K922 Gastrointestinal hemorrhage, unspecified: Secondary | ICD-10-CM | POA: Diagnosis not present

## 2015-11-04 DIAGNOSIS — K219 Gastro-esophageal reflux disease without esophagitis: Secondary | ICD-10-CM | POA: Diagnosis not present

## 2015-11-04 DIAGNOSIS — Z8673 Personal history of transient ischemic attack (TIA), and cerebral infarction without residual deficits: Secondary | ICD-10-CM | POA: Diagnosis not present

## 2015-11-04 DIAGNOSIS — K279 Peptic ulcer, site unspecified, unspecified as acute or chronic, without hemorrhage or perforation: Secondary | ICD-10-CM | POA: Diagnosis not present

## 2015-11-06 DIAGNOSIS — K219 Gastro-esophageal reflux disease without esophagitis: Secondary | ICD-10-CM | POA: Diagnosis not present

## 2015-11-06 DIAGNOSIS — G40909 Epilepsy, unspecified, not intractable, without status epilepticus: Secondary | ICD-10-CM | POA: Diagnosis not present

## 2015-11-06 DIAGNOSIS — K279 Peptic ulcer, site unspecified, unspecified as acute or chronic, without hemorrhage or perforation: Secondary | ICD-10-CM | POA: Diagnosis not present

## 2015-11-06 DIAGNOSIS — F1010 Alcohol abuse, uncomplicated: Secondary | ICD-10-CM | POA: Diagnosis not present

## 2015-11-06 DIAGNOSIS — Z8673 Personal history of transient ischemic attack (TIA), and cerebral infarction without residual deficits: Secondary | ICD-10-CM | POA: Diagnosis not present

## 2015-11-06 DIAGNOSIS — K922 Gastrointestinal hemorrhage, unspecified: Secondary | ICD-10-CM | POA: Diagnosis not present

## 2015-11-06 DIAGNOSIS — I10 Essential (primary) hypertension: Secondary | ICD-10-CM | POA: Diagnosis not present

## 2015-11-07 DIAGNOSIS — K279 Peptic ulcer, site unspecified, unspecified as acute or chronic, without hemorrhage or perforation: Secondary | ICD-10-CM | POA: Diagnosis not present

## 2015-11-07 DIAGNOSIS — K219 Gastro-esophageal reflux disease without esophagitis: Secondary | ICD-10-CM | POA: Diagnosis not present

## 2015-11-07 DIAGNOSIS — I10 Essential (primary) hypertension: Secondary | ICD-10-CM | POA: Diagnosis not present

## 2015-11-07 DIAGNOSIS — Z8673 Personal history of transient ischemic attack (TIA), and cerebral infarction without residual deficits: Secondary | ICD-10-CM | POA: Diagnosis not present

## 2015-11-07 DIAGNOSIS — G40909 Epilepsy, unspecified, not intractable, without status epilepticus: Secondary | ICD-10-CM | POA: Diagnosis not present

## 2015-11-07 DIAGNOSIS — F1010 Alcohol abuse, uncomplicated: Secondary | ICD-10-CM | POA: Diagnosis not present

## 2015-11-07 DIAGNOSIS — K922 Gastrointestinal hemorrhage, unspecified: Secondary | ICD-10-CM | POA: Diagnosis not present

## 2015-11-25 ENCOUNTER — Encounter: Payer: Self-pay | Admitting: Gastroenterology

## 2015-11-25 ENCOUNTER — Ambulatory Visit (INDEPENDENT_AMBULATORY_CARE_PROVIDER_SITE_OTHER): Payer: Medicare Other | Admitting: Gastroenterology

## 2015-11-25 VITALS — BP 144/83 | HR 109 | Temp 98.3°F | Ht 64.0 in | Wt 145.2 lb

## 2015-11-25 DIAGNOSIS — K2100 Gastro-esophageal reflux disease with esophagitis, without bleeding: Secondary | ICD-10-CM

## 2015-11-25 DIAGNOSIS — F1010 Alcohol abuse, uncomplicated: Secondary | ICD-10-CM | POA: Diagnosis not present

## 2015-11-25 DIAGNOSIS — D649 Anemia, unspecified: Secondary | ICD-10-CM | POA: Insufficient documentation

## 2015-11-25 MED ORDER — OMEPRAZOLE 20 MG PO CPDR: 20.0000 mg | DELAYED_RELEASE_CAPSULE | Freq: Two times a day (BID) | ORAL | Status: DC

## 2015-11-25 NOTE — Progress Notes (Signed)
cc'ed to pcp °

## 2015-11-25 NOTE — Assessment & Plan Note (Signed)
Recent hematemesis requiring hospitalization. Did not require repeat EGD. EGD back in January 2016 with severe exudative/erosive reflux esophagitis. Patient currently having breakthrough symptoms especially in the evenings. Discharged from the hospital on omeprazole 20 mg once daily. She needs twice a day PPI therapy. Continue to encourage alcohol cessation. Patient is trying to cut back. Described ill effects on her health. She states she does not need any help. She has to get in her head that she needs to do it.   Increase omeprazole to 20 mg twice a day. New Rx provided. Return to the office in 4 months or sooner if needed. I have requested records of most recent labs from PCP to follow-up on her anemia.

## 2015-11-25 NOTE — Assessment & Plan Note (Signed)
Most recent labs from PCP requested.

## 2015-11-25 NOTE — Patient Instructions (Signed)
1. Increase your omeprazole to 20mg  before breakfast and before you evening meal (30 minutes before you eat). New RX sent to [facility]. 2. I will review labs from Dr. Legrand Rams and if you need any additional labs we will let you know. 3. Continue to try and cut back on your alcohol consumption. It is not good for your health. 4. Return to the office in four months or call sooner if needed.

## 2015-11-25 NOTE — Progress Notes (Addendum)
REVIEWED-NO ADDITIONAL RECOMMENDATIONS.  Primary Care Physician: [name], MD  Primary Gastroenterologist:  Barney Drain, MD   Chief Complaint  Patient presents with  . Follow-up    HPI: [name] is a 60 y.o. female here for hospital follow-up. Back in December she was admitted with recurrent hematemesis and abdominal pain in the setting of alcohol intake. NSAID use as well. Documented severe erosive esophagitis back on EGD in January 2016. Evidence of alcoholic hepatitis during admission. Abdominal ultrasound with no signs of cirrhosis. She did not require EGD during this admission.  Heartburn at nighttime. Takes omeprazole as soon as she gets up but often sleeps late. No dysphagia. No vomiting. Appetite comes and goes. No abdominal pain. BM regular. No melena, brbpr. Has "cut back" on etoh use. 24 ounces beer and sometimes wine.   Current Outpatient Prescriptions  Medication Sig Dispense Refill  . albuterol (PROVENTIL HFA;VENTOLIN HFA) 108 (90 BASE) MCG/ACT inhaler Inhale 2 puffs into the lungs every 6 (six) hours as needed. FOR SHORTNESS OF BREATH AND/OR WHEEZING    . HYDROcodone-acetaminophen (NORCO) 7.5-325 MG tablet Take 1 tablet by mouth every 4 (four) hours as needed for moderate pain (FOR 30 DAYS).    Marland Kitchen ipratropium-albuterol (DUONEB) 0.5-2.5 (3) MG/3ML SOLN Take 3 mLs by nebulization every 6 (six) hours as needed (for shortness of breath/wheezing).     Marland Kitchen lisinopril-hydrochlorothiazide (PRINZIDE,ZESTORETIC) 20-12.5 MG per tablet Take 1 tablet by mouth daily.    Marland Kitchen omeprazole (PRILOSEC) 20 MG capsule Take 20 mg by mouth daily.    . phenytoin (DILANTIN) 100 MG ER capsule Take 100 mg by mouth 3 (three) times daily.    . solifenacin (VESICARE) 5 MG tablet Take 5 mg by mouth daily.     No current facility-administered medications for this visit.    Allergies as of 11/25/2015 - Review Complete 11/25/2015  Allergen Reaction Noted  . Other  08/17/2013     ROS:  General: Negative for anorexia, weight loss, fever, chills, fatigue, weakness. ENT: Negative for hoarseness, difficulty swallowing , nasal congestion. CV: Negative for chest pain, angina, palpitations, dyspnea on exertion, peripheral edema.  Respiratory: Negative for dyspnea at rest, dyspnea on exertion, cough, sputum, wheezing.  GI: See history of present illness. GU:  Negative for dysuria, hematuria, urinary incontinence, urinary frequency, nocturnal urination.  Endo: Negative for unusual weight change.    Physical Examination:   BP 144/83 mmHg  Pulse 109  Temp(Src) 98.3 F (36.8 C) (Oral)  Ht 5\' 4"  (1.626 m)  Wt 145 lb 3.2 oz (65.862 kg)  BMI 24.91 kg/m2  General: Well-nourished, well-developed in no acute distress.  Eyes: No icterus. Mouth: Oropharyngeal mucosa moist and pink , no lesions erythema or exudate. Lungs: Clear to auscultation bilaterally.  Heart: Regular rate and rhythm, no murmurs rubs or gallops.  Abdomen: Bowel sounds are normal, nontender, nondistended, no hepatosplenomegaly or masses, no abdominal bruits or hernia , no rebound or guarding.   Extremities: No lower extremity edema. No clubbing or deformities. Neuro: Alert and oriented x 4   Skin: Warm and dry, no jaundice.   Psych: Alert and cooperative, normal mood and affect.  Labs:  Lab Results  Component Value Date   WBC 4.7 10/17/2015   HGB 10.3* 10/17/2015   HCT 30.8* 10/17/2015   MCV 93.3 10/17/2015   PLT 134* 10/17/2015   Lab Results  Component Value Date   CREATININE 0.60 10/15/2015   BUN 9 10/15/2015   NA 136 10/15/2015   K  3.1* 10/15/2015   CL 101 10/15/2015   CO2 28 10/15/2015   Lab Results  Component Value Date   ALT 24 10/15/2015   AST 49* 10/15/2015   ALKPHOS 55 10/15/2015   BILITOT 1.7* 10/15/2015    Imaging Studies: No results found.

## 2015-12-02 DIAGNOSIS — R569 Unspecified convulsions: Secondary | ICD-10-CM | POA: Diagnosis not present

## 2015-12-02 DIAGNOSIS — G8111 Spastic hemiplegia affecting right dominant side: Secondary | ICD-10-CM | POA: Diagnosis not present

## 2015-12-02 DIAGNOSIS — I10 Essential (primary) hypertension: Secondary | ICD-10-CM | POA: Diagnosis not present

## 2015-12-02 DIAGNOSIS — K219 Gastro-esophageal reflux disease without esophagitis: Secondary | ICD-10-CM | POA: Diagnosis not present

## 2015-12-08 ENCOUNTER — Telehealth: Payer: Self-pay | Admitting: Orthopaedic Surgery

## 2015-12-08 NOTE — Telephone Encounter (Signed)
Patient requesting Hydrocodone 7.5/325mg   Qty 120 Tablets

## 2015-12-09 MED ORDER — HYDROCODONE-ACETAMINOPHEN 7.5-325 MG PO TABS: 1.0000 | ORAL_TABLET | ORAL | Status: DC | PRN

## 2015-12-09 NOTE — Telephone Encounter (Signed)
Patient aware prescription available for pick up 

## 2015-12-09 NOTE — Telephone Encounter (Signed)
Rx printed

## 2015-12-10 NOTE — Progress Notes (Addendum)
Labs from 10/07/2015 Total bilirubin 0.4, alkaline phosphatase 102, AST 69, ALT 30, albumin 4.2, white blood cell count 3300, hemoglobin 12.7, hematocrit 36.7, platelets 176,000

## 2015-12-29 DIAGNOSIS — K219 Gastro-esophageal reflux disease without esophagitis: Secondary | ICD-10-CM | POA: Diagnosis not present

## 2015-12-29 DIAGNOSIS — I10 Essential (primary) hypertension: Secondary | ICD-10-CM | POA: Diagnosis not present

## 2015-12-29 DIAGNOSIS — R569 Unspecified convulsions: Secondary | ICD-10-CM | POA: Diagnosis not present

## 2015-12-29 DIAGNOSIS — G8111 Spastic hemiplegia affecting right dominant side: Secondary | ICD-10-CM | POA: Diagnosis not present

## 2015-12-31 ENCOUNTER — Ambulatory Visit: Payer: Medicare Other | Admitting: Orthopaedic Surgery

## 2016-01-06 ENCOUNTER — Encounter: Payer: Self-pay | Admitting: Orthopaedic Surgery

## 2016-01-06 ENCOUNTER — Ambulatory Visit (INDEPENDENT_AMBULATORY_CARE_PROVIDER_SITE_OTHER): Payer: Medicare Other | Admitting: Orthopaedic Surgery

## 2016-01-06 VITALS — BP 152/83 | HR 114 | Temp 97.3°F | Resp 16 | Ht 64.0 in | Wt 146.0 lb

## 2016-01-06 DIAGNOSIS — M25511 Pain in right shoulder: Secondary | ICD-10-CM

## 2016-01-06 MED ORDER — ACETAMINOPHEN-CODEINE #3 300-30 MG PO TABS: ORAL_TABLET | ORAL | Status: DC

## 2016-01-06 NOTE — Progress Notes (Signed)
Patient [id number] [name], female DOB:[birth year], 60 y.o. [id number]  Chief Complaint  Patient presents with  . Follow-up    follow up right shoulder pain "worse"    HPI  [name] is a 60 y.o. female who has chronic right shoulder pain.  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  She has pain in the shoulder more at night.  She has no new trauma, no new problems.  She is doing her exercises for the shoulder as best as she can.  HPI  Body mass index is 25.05 kg/(m^2).  Review of Systems  Constitutional:       Patient does not have Diabetes Mellitus. Patient has hypertension. Patient has COPD or shortness of breath. Patient does not have BMI > 35. Patient does not have current smoking history.  Respiratory: Negative for shortness of breath.   Cardiovascular: Negative for chest pain.  Endocrine: Positive for cold intolerance.  Musculoskeletal: Positive for myalgias, arthralgias, gait problem and neck pain.  Allergic/Immunologic: Positive for environmental allergies.  Neurological: Positive for weakness (right side) and headaches.    Past Medical History  Diagnosis Date  . Stroke [facility])     age 73, required brain surgery  . Seizures ([place])     since stroke, no recent seizures  . HTN (hypertension)   . PUD (peptic ulcer disease)     remote  . Shortness of breath     with exertion  . Contracture of muscle of hand     right  . Alcohol abuse   . Chronic abdominal pain   . Chronic diarrhea     Past Surgical History  Procedure Laterality Date  . Abdominal hysterectomy  2004    complete  . Right tib-fib fracture  2008  . Brain surgery      age 3, stroke  . Colonoscopy with propofol  10/03/2012    Dr. [name]: moderate diverticulosis, small internal hemorrhoids, TUBULAR ADENOMA. Next colonoscopy 2023 per Dr. [name].  . Esophagogastroduodenoscopy (egd) with propofol  10/03/2012    Dr. [name]: stricture at [place] junction s/p dilation, mild  gastritis negative H.pylori   . Savory dilation  10/03/2012    Procedure: SAVORY DILATION;  Surgeon: Lauren Binder, MD;  Location: [facility];  Service: Endoscopy;  Laterality: N/A;  started at 1303,  dilated 12.8-16mm  . Esophagogastroduodenoscopy N/A 10/18/2014    Dr. Gala [name] during inpatient hospitalization: severe exudative/erosive reflux esophagitis as source of trivial upper GI bleed. No varices     Family History  Problem Relation Age of Onset  . Colon cancer Neg Hx   . Liver disease Sister     etoh    Social History Social History  Substance Use Topics  . Smoking status: Former Smoker -- 0.04 packs/day for 9 years    Types: Cigarettes    Quit date: 07/28/2014  . Smokeless tobacco: Never Used  . Alcohol Use: Yes     Comment: last drank today    Allergies  Allergen Reactions  . Other     Unable to recall med    Current Outpatient Prescriptions  Medication Sig Dispense Refill  . albuterol (PROVENTIL HFA;VENTOLIN HFA) 108 (90 BASE) MCG/ACT inhaler Inhale 2 puffs into the lungs every 6 (six) hours as needed. FOR SHORTNESS OF BREATH AND/OR WHEEZING    . ipratropium-albuterol (DUONEB) 0.5-2.5 (3) MG/3ML SOLN Take 3 mLs by nebulization every 6 (six) hours as needed (for shortness of breath/wheezing).     Marland Kitchen  lisinopril-hydrochlorothiazide (PRINZIDE,ZESTORETIC) 20-12.5 MG per tablet Take 1 tablet by mouth daily.    Marland Kitchen omeprazole (PRILOSEC) 20 MG capsule Take 1 capsule (20 mg total) by mouth 2 (two) times daily before a meal. 60 capsule 5  . phenytoin (DILANTIN) 100 MG ER capsule Take 100 mg by mouth 3 (three) times daily.    . solifenacin (VESICARE) 5 MG tablet Take 5 mg by mouth daily.    Marland Kitchen acetaminophen-codeine (TYLENOL #3) 300-30 MG tablet One tablet every four hours as needed for pain.  Must last TEN days. 40 tablet 2   No current facility-administered medications for this visit.     Physical Exam  Blood pressure 152/83, pulse 114, temperature 97.3 F (36.3 C), resp. rate 16,  height 5\' 4"  (1.626 m), weight 146 lb (66.225 kg).  Constitutional: overall normal hygiene, normal nutrition, well developed, normal grooming, normal body habitus. Assistive device:none  Musculoskeletal: gait and station Limp right, muscle tone and strength are normal on the left.  She has partial paralysis of the right upper extremity with hand deformities and has atrophy of the upper arm and forearm.  She has no tremors.  The left upper extremity is normal. .  Neurological: coordination normal on the left. Partial paralysis is present on the right.  Deep tendon reflex/nerve stretch intact on the left, absent on the right.  Sensation normal.  Cranial nerves II-XII intact.   Skin:   normal overall no scars, lesions, ulcers or rashes. No psoriasis.  Psychiatric: Alert and oriented x 3.  Recent memory intact, remote memory unclear.  Normal mood and affect. Well groomed.  Good eye contact.  Cardiovascular: overall no swelling, no varicosities, no edema bilaterally, normal temperatures of the legs and arms, no clubbing, cyanosis and good capillary refill.  Lymphatic: palpation is normal.   Extremities:the right arm has contractures and obvious deformity of the hand from the stroke. The left side is normal. Inspection per above Strength and tone abnormal right, normal left Range of motion very limited of the right hand and wrist and shoulder with flexion contractures.  The left side is normal.  The right shoulder has abduction of 45, internal of 20, external of 20, extension of 5, forward flexion of 60 and adduction of 20.   The patient has been educated about the nature of the problem(s) and counseled on treatment options.  The patient appeared to understand what I have discussed and is in agreement with it.  PLAN Call if any problems.  Precautions discussed.  Continue current medications.   Return to clinic 3 months.

## 2016-01-07 ENCOUNTER — Ambulatory Visit: Payer: Medicare Other | Admitting: Orthopaedic Surgery

## 2016-01-13 DIAGNOSIS — G8111 Spastic hemiplegia affecting right dominant side: Secondary | ICD-10-CM | POA: Diagnosis not present

## 2016-01-13 DIAGNOSIS — R569 Unspecified convulsions: Secondary | ICD-10-CM | POA: Diagnosis not present

## 2016-01-13 DIAGNOSIS — I10 Essential (primary) hypertension: Secondary | ICD-10-CM | POA: Diagnosis not present

## 2016-01-13 DIAGNOSIS — F10188 Alcohol abuse with other alcohol-induced disorder: Secondary | ICD-10-CM | POA: Diagnosis not present

## 2016-03-24 ENCOUNTER — Telehealth: Payer: Self-pay | Admitting: Gastroenterology

## 2016-03-24 ENCOUNTER — Ambulatory Visit: Payer: Medicare Other | Admitting: Gastroenterology

## 2016-03-24 ENCOUNTER — Encounter: Payer: Self-pay | Admitting: Gastroenterology

## 2016-03-24 NOTE — Telephone Encounter (Signed)
PATIENT WAS A NO SHOW AND LETTER SENT  °

## 2016-04-07 ENCOUNTER — Encounter: Payer: Self-pay | Admitting: Orthopaedic Surgery

## 2016-04-07 ENCOUNTER — Ambulatory Visit (INDEPENDENT_AMBULATORY_CARE_PROVIDER_SITE_OTHER): Payer: Medicare Other | Admitting: Orthopaedic Surgery

## 2016-04-07 VITALS — BP 157/80 | HR 82 | Temp 97.5°F | Ht 64.0 in | Wt 143.0 lb

## 2016-04-07 DIAGNOSIS — M25511 Pain in right shoulder: Secondary | ICD-10-CM

## 2016-04-07 MED ORDER — HYDROCODONE-ACETAMINOPHEN 5-325 MG PO TABS: 1.0000 | ORAL_TABLET | ORAL | Status: DC | PRN

## 2016-04-07 NOTE — Progress Notes (Signed)
Patient HC:[id number] [name], female DOB:02/20/1956, 60 y.o. [id number]  Chief Complaint  Patient presents with  . Follow-up    Right shoulder    HPI  [name] is a 60 y.o. female who has right shoulder pain chronically.  She has had a CVA with right sided hemiparesis and limited motion of the right shoulder, elbow and hand.  She has hand contractures.  She says the shoulder is more tender.  She has no new trauma.  She has no redness or swelling.  HPI  Body mass index is 24.53 kg/(m^2).  ROS  Review of Systems  Constitutional:       Patient does not have Diabetes Mellitus. Patient has hypertension. Patient has COPD or shortness of breath. Patient does not have BMI > 35. Patient does not have current smoking history.  Respiratory: Negative for shortness of breath.   Cardiovascular: Negative for chest pain.  Endocrine: Positive for cold intolerance.  Musculoskeletal: Positive for myalgias, arthralgias, gait problem and neck pain.  Allergic/Immunologic: Positive for environmental allergies.  Neurological: Positive for weakness (right side) and headaches.    Past Medical History  Diagnosis Date  . Stroke [facility])     age 60, required brain surgery  . Seizures ([place])     since stroke, no recent seizures  . HTN (hypertension)   . PUD (peptic ulcer disease)     remote  . Shortness of breath     with exertion  . Contracture of muscle of hand     right  . Alcohol abuse   . Chronic abdominal pain   . Chronic diarrhea     Past Surgical History  Procedure Laterality Date  . Abdominal hysterectomy  2004    complete  . Right tib-fib fracture  2008  . Brain surgery      age 71, stroke  . Colonoscopy with propofol  10/03/2012    Dr. [name]: moderate diverticulosis, small internal hemorrhoids, TUBULAR ADENOMA. Next colonoscopy 2023 per Dr. [name].  . Esophagogastroduodenoscopy (egd) with propofol  10/03/2012    Dr. [name]: stricture at [place] junction s/p dilation, mild  gastritis negative H.pylori   . Savory dilation  10/03/2012    Procedure: SAVORY DILATION;  Surgeon: Danie Binder, MD;  Location: [facility];  Service: Endoscopy;  Laterality: N/A;  started at 1303,  dilated 12.8-16mm  . Esophagogastroduodenoscopy N/A 10/18/2014    Dr. Gala Romney during inpatient hospitalization: severe exudative/erosive reflux esophagitis as source of trivial upper GI bleed. No varices     Family History  Problem Relation Age of Onset  . Colon cancer Neg Hx   . Liver disease Sister     etoh    Social History Social History  Substance Use Topics  . Smoking status: Former Smoker -- 0.04 packs/day for 9 years    Types: Cigarettes    Quit date: 07/28/2014  . Smokeless tobacco: Never Used  . Alcohol Use: Yes     Comment: last drank today    Allergies  Allergen Reactions  . Other     Unable to recall med    Current Outpatient Prescriptions  Medication Sig Dispense Refill  . albuterol (PROVENTIL HFA;VENTOLIN HFA) 108 (90 BASE) MCG/ACT inhaler Inhale 2 puffs into the lungs every 6 (six) hours as needed. FOR SHORTNESS OF BREATH AND/OR WHEEZING    . ipratropium-albuterol (DUONEB) 0.5-2.5 (3) MG/3ML SOLN Take 3 mLs by nebulization every 6 (six) hours as needed (for shortness of breath/wheezing).     Marland Kitchen lisinopril-hydrochlorothiazide (PRINZIDE,ZESTORETIC)  20-12.5 MG per tablet Take 1 tablet by mouth daily.    Marland Kitchen omeprazole (PRILOSEC) 20 MG capsule Take 1 capsule (20 mg total) by mouth 2 (two) times daily before a meal. 60 capsule 5  . phenytoin (DILANTIN) 100 MG ER capsule Take 100 mg by mouth 3 (three) times daily.    . solifenacin (VESICARE) 5 MG tablet Take 5 mg by mouth daily.    Marland Kitchen HYDROcodone-acetaminophen (NORCO/VICODIN) 5-325 MG tablet Take 1 tablet by mouth every 4 (four) hours as needed for moderate pain (Must last 15 days.Do not take and drive a car or use machinery.). 60 tablet 0   No current facility-administered medications for this visit.     Physical  Exam  Blood pressure 157/80, pulse 82, temperature 97.5 F (36.4 C), height 5\' 4"  (1.626 m), weight 143 lb (64.864 kg).  Constitutional: overall normal hygiene, normal nutrition, well developed, normal grooming, normal body habitus. Assistive device:none  Musculoskeletal: gait and station Limp right, muscle tone and strength are normal, no tremors or atrophy is present.  .  Neurological: coordination overall normal on the left, flexion contractures upper extremity on right..  Deep tendon reflex/nerve stretch intact on left side of body..  Sensation normal.  Cranial nerves II-XII intact.   Skin:   normal overall no scars, lesions, ulcers or rashes. No psoriasis.  Psychiatric: Alert and oriented x 3.  Recent memory intact, remote memory unclear.  Normal mood and affect. Well groomed.  Good eye contact.  Cardiovascular: overall no swelling, no varicosities, no edema bilaterally, normal temperatures of the legs and arms, no clubbing, cyanosis and good capillary refill.  Lymphatic: palpation is normal.  She has very limited motion of the right shoulder, right elbow and hand and wrist secondary to the old CVA.  She has flexion contractures of the right hand and wrist.  Left side normal.  The patient has been educated about the nature of the problem(s) and counseled on treatment options.  The patient appeared to understand what I have discussed and is in agreement with it.  Encounter Diagnosis  Name Primary?  . Right shoulder pain Yes    PLAN Call if any problems.  Precautions discussed.  Continue current medications.   Return to clinic 3 months

## 2016-05-25 ENCOUNTER — Telehealth: Payer: Self-pay | Admitting: Orthopaedic Surgery

## 2016-05-25 MED ORDER — HYDROCODONE-ACETAMINOPHEN 5-325 MG PO TABS: 1.0000 | ORAL_TABLET | ORAL | 0 refills | Status: DC | PRN

## 2016-05-25 NOTE — Telephone Encounter (Signed)
Patient requests a refill on Hydrocodone/Acetaminophen 5-325 mgs.  Qty  60    Sig: Take 1 tablet by mouth every 4 (four) hours as needed for moderate pain (Must last 15 days.Do not take and drive a car or use machinery.).

## 2016-07-07 ENCOUNTER — Ambulatory Visit (INDEPENDENT_AMBULATORY_CARE_PROVIDER_SITE_OTHER): Payer: Medicare Other | Admitting: Orthopaedic Surgery

## 2016-07-07 ENCOUNTER — Encounter: Payer: Self-pay | Admitting: Orthopaedic Surgery

## 2016-07-07 VITALS — BP 114/70 | HR 89 | Ht 64.0 in | Wt 144.0 lb

## 2016-07-07 DIAGNOSIS — M25561 Pain in right knee: Secondary | ICD-10-CM | POA: Diagnosis not present

## 2016-07-07 DIAGNOSIS — M25511 Pain in right shoulder: Secondary | ICD-10-CM | POA: Diagnosis not present

## 2016-07-07 MED ORDER — HYDROCODONE-ACETAMINOPHEN 5-325 MG PO TABS: 1.0000 | ORAL_TABLET | Freq: Four times a day (QID) | ORAL | 0 refills | Status: DC | PRN

## 2016-07-07 NOTE — Progress Notes (Signed)
Patient [id number] [name], female DOB:23-Jun-1956, 60 y.o. [id number]  Chief Complaint  Patient presents with  . Follow-up    right shoulder pain    HPI  [name] is a 60 y.o. female who has chronic right shoulder pain.  She is post CVA and has weakness and deformity of right hand secondary to this.  She has limited motion of the shoulder.  Her shoulder is stable.  She has developed pain and swelling of the right knee.  She has popping but no giving way or redness. She has no new trauma.  She has a limp.  HPI  Body mass index is 24.72 kg/m.  ROS  Review of Systems  Constitutional:       Patient does not have Diabetes Mellitus. Patient has hypertension. Patient has COPD or shortness of breath. Patient does not have BMI > 35. Patient does not have current smoking history.  Respiratory: Negative for shortness of breath.   Cardiovascular: Negative for chest pain.  Endocrine: Positive for cold intolerance.  Musculoskeletal: Positive for arthralgias, gait problem, myalgias and neck pain.  Allergic/Immunologic: Positive for environmental allergies.  Neurological: Positive for weakness (right side) and headaches.    Past Medical History:  Diagnosis Date  . Alcohol abuse   . Chronic abdominal pain   . Chronic diarrhea   . Contracture of muscle of hand    right  . HTN (hypertension)   . PUD (peptic ulcer disease)    remote  . Seizures ([place])    since stroke, no recent seizures  . Shortness of breath    with exertion  . Stroke [facility])    age 15, required brain surgery    Past Surgical History:  Procedure Laterality Date  . ABDOMINAL HYSTERECTOMY  2004   complete  . BRAIN SURGERY     age 30, stroke  . COLONOSCOPY WITH PROPOFOL  10/03/2012   Dr. [name]: moderate diverticulosis, small internal hemorrhoids, TUBULAR ADENOMA. Next colonoscopy 2023 per Dr. [name].  . ESOPHAGOGASTRODUODENOSCOPY N/A 10/18/2014   Dr. Gala Romney during inpatient hospitalization: severe  exudative/erosive reflux esophagitis as source of trivial upper GI bleed. No varices   . ESOPHAGOGASTRODUODENOSCOPY (EGD) WITH PROPOFOL  10/03/2012   Dr. [name]: stricture at [place] junction s/p dilation, mild gastritis negative H.pylori   . right tib-fib fracture  2008  . SAVORY DILATION  10/03/2012   Procedure: SAVORY DILATION;  Surgeon: Danie Binder, MD;  Location: [facility];  Service: Endoscopy;  Laterality: N/A;  started at 1303,  dilated 12.8-16mm    Family History  Problem Relation Age of Onset  . Colon cancer Neg Hx   . Liver disease Sister     etoh    Social History Social History  Substance Use Topics  . Smoking status: Former Smoker    Packs/day: 0.04    Years: 9.00    Types: Cigarettes    Quit date: 07/28/2014  . Smokeless tobacco: Never Used  . Alcohol use Yes     Comment: last drank today    Allergies  Allergen Reactions  . Other     Unable to recall med    Current Outpatient Prescriptions  Medication Sig Dispense Refill  . albuterol (PROVENTIL HFA;VENTOLIN HFA) 108 (90 BASE) MCG/ACT inhaler Inhale 2 puffs into the lungs every 6 (six) hours as needed. FOR SHORTNESS OF BREATH AND/OR WHEEZING    . HYDROcodone-acetaminophen (NORCO/VICODIN) 5-325 MG tablet Take 1 tablet by mouth every 6 (six) hours as needed for moderate pain (Must  last 14 days.Do not take and drive a car or use machinery.). 52 tablet 0  . ipratropium-albuterol (DUONEB) 0.5-2.5 (3) MG/3ML SOLN Take 3 mLs by nebulization every 6 (six) hours as needed (for shortness of breath/wheezing).     Marland Kitchen lisinopril-hydrochlorothiazide (PRINZIDE,ZESTORETIC) 20-12.5 MG per tablet Take 1 tablet by mouth daily.    Marland Kitchen omeprazole (PRILOSEC) 20 MG capsule Take 1 capsule (20 mg total) by mouth 2 (two) times daily before a meal. 60 capsule 5  . phenytoin (DILANTIN) 100 MG ER capsule Take 100 mg by mouth 3 (three) times daily.    . solifenacin (VESICARE) 5 MG tablet Take 5 mg by mouth daily.     No current  facility-administered medications for this visit.      Physical Exam  Blood pressure 114/70, pulse 89, height 5\' 4"  (1.626 m), weight 144 lb (65.3 kg).  Constitutional: overall normal hygiene, normal nutrition, well developed, normal grooming, normal body habitus. Assistive device:none  Musculoskeletal: gait and station Limp right, muscle tone and strength are normal, no tremors or atrophy is present.  .  Neurological: coordination abnormal right upper extremity secondary to CVA.  Deep tendon reflex/nerve stretch intact.  Sensation normal.  Cranial nerves II-XII intact.   Skin:   Normal overall no scars, lesions, ulcers or rashes. No psoriasis.  Psychiatric: Alert and oriented x 3.  Recent memory intact, remote memory unclear.  Normal mood and affect. Well groomed.  Good eye contact.  Cardiovascular: overall no swelling, no varicosities, no edema bilaterally, normal temperatures of the legs and arms, no clubbing, cyanosis and good capillary refill.  Lymphatic: palpation is normal.  The right lower extremity is examined:  Inspection:  Thigh:  Non-tender and no defects  Knee has swelling 1+ effusion.                        Joint tenderness is present                        Patient is tender over the medial joint line  Lower Leg:  Has normal appearance and no tenderness or defects  Ankle:  Non-tender and no defects  Foot:  Non-tender and no defects Range of Motion:  Knee:  Range of motion is: 0-100                        Crepitus is  present  Ankle:  Range of motion is normal. Strength and Tone:  The right lower extremity has normal strength and tone. Stability:  Knee:  The knee is stable.  Ankle:  The ankle is stable.  Her right shoulder has decreased motion.  She can abduct only 45, internal 20, external 20, adduction 20, extend 5, forward flexion 60.  It is very painful.  She has deformity of right hand secondary to CVA.  Right upper extremity is weak, 3 of 5.   The  patient has been educated about the nature of the problem(s) and counseled on treatment options.  The patient appeared to understand what I have discussed and is in agreement with it.  Encounter Diagnoses  Name Primary?  . Right shoulder pain Yes  . Right knee pain    PROCEDURE NOTE:  The patient requests injections of the right knee , verbal consent was obtained.  The right knee was prepped appropriately after time out was performed.   Sterile technique was observed and injection of 1  cc of Depo-Medrol 40 mg with several cc's of plain xylocaine. Anesthesia was provided by ethyl chloride and a 20-gauge needle was used to inject the knee area. The injection was tolerated well.  A band aid dressing was applied.  The patient was advised to apply ice later today and tomorrow to the injection sight as needed.   PLAN Call if any problems.  Precautions discussed.  Continue current medications.   Return to clinic 3 months   Electronically Signed Sanjuana Kava, MD 9/20/201710:33 AM

## 2016-07-30 ENCOUNTER — Other Ambulatory Visit: Payer: Self-pay | Admitting: Orthopaedic Surgery

## 2016-07-30 ENCOUNTER — Emergency Department (HOSPITAL_COMMUNITY): Payer: Medicare Other

## 2016-07-30 ENCOUNTER — Encounter (HOSPITAL_COMMUNITY): Payer: Self-pay | Admitting: Emergency Medicine

## 2016-07-30 ENCOUNTER — Inpatient Hospital Stay (HOSPITAL_COMMUNITY)
Admission: EM | Admit: 2016-07-30 | Discharge: 2016-08-02 | DRG: 641 | Disposition: A | Discharge status: Home / Self Care | Payer: Medicare Other | Attending: Internal Medicine | Admitting: Internal Medicine

## 2016-07-30 DIAGNOSIS — Z8711 Personal history of peptic ulcer disease: Secondary | ICD-10-CM | POA: Diagnosis not present

## 2016-07-30 DIAGNOSIS — I10 Essential (primary) hypertension: Secondary | ICD-10-CM | POA: Diagnosis present

## 2016-07-30 DIAGNOSIS — N179 Acute kidney failure, unspecified: Secondary | ICD-10-CM

## 2016-07-30 DIAGNOSIS — G40909 Epilepsy, unspecified, not intractable, without status epilepticus: Secondary | ICD-10-CM

## 2016-07-30 DIAGNOSIS — D649 Anemia, unspecified: Secondary | ICD-10-CM | POA: Diagnosis present

## 2016-07-30 DIAGNOSIS — I951 Orthostatic hypotension: Secondary | ICD-10-CM | POA: Diagnosis not present

## 2016-07-30 DIAGNOSIS — E876 Hypokalemia: Principal | ICD-10-CM

## 2016-07-30 DIAGNOSIS — I6789 Other cerebrovascular disease: Secondary | ICD-10-CM | POA: Diagnosis not present

## 2016-07-30 DIAGNOSIS — Z87891 Personal history of nicotine dependence: Secondary | ICD-10-CM

## 2016-07-30 DIAGNOSIS — I959 Hypotension, unspecified: Secondary | ICD-10-CM | POA: Diagnosis present

## 2016-07-30 DIAGNOSIS — E871 Hypo-osmolality and hyponatremia: Secondary | ICD-10-CM

## 2016-07-30 DIAGNOSIS — Z8673 Personal history of transient ischemic attack (TIA), and cerebral infarction without residual deficits: Secondary | ICD-10-CM | POA: Diagnosis not present

## 2016-07-30 DIAGNOSIS — F1010 Alcohol abuse, uncomplicated: Secondary | ICD-10-CM | POA: Diagnosis not present

## 2016-07-30 DIAGNOSIS — K529 Noninfective gastroenteritis and colitis, unspecified: Secondary | ICD-10-CM | POA: Diagnosis present

## 2016-07-30 LAB — COMPREHENSIVE METABOLIC PANEL
ALT: 18 U/L (ref 14–54)
AST: 46 U/L — ABNORMAL HIGH (ref 15–41)
Albumin: 4.3 g/dL (ref 3.5–5.0)
Alkaline Phosphatase: 75 U/L (ref 38–126)
Anion gap: 24 — ABNORMAL HIGH (ref 5–15)
BUN: 40 mg/dL — ABNORMAL HIGH (ref 6–20)
CO2: 38 mmol/L — ABNORMAL HIGH (ref 22–32)
Calcium: 10 mg/dL (ref 8.9–10.3)
Chloride: 67 mmol/L — ABNORMAL LOW (ref 101–111)
Creatinine, Ser: 9.23 mg/dL — ABNORMAL HIGH (ref 0.44–1.00)
GFR calc Af Amer: 5 mL/min — ABNORMAL LOW (ref >60)
GFR calc non Af Amer: 4 mL/min — ABNORMAL LOW (ref >60)
Glucose, Bld: 114 mg/dL — ABNORMAL HIGH (ref 65–99)
Potassium: 2 mmol/L — CL (ref 3.5–5.1)
Sodium: 129 mmol/L — ABNORMAL LOW (ref 135–145)
Total Bilirubin: 2.3 mg/dL — ABNORMAL HIGH (ref 0.3–1.2)
Total Protein: 8.9 g/dL — ABNORMAL HIGH (ref 6.5–8.1)

## 2016-07-30 LAB — CBC WITH DIFFERENTIAL/PLATELET
Basophils Absolute: 0 10*3/uL (ref 0.0–0.1)
Basophils Relative: 0 %
Eosinophils Absolute: 0.1 10*3/uL (ref 0.0–0.7)
Eosinophils Relative: 1 %
HCT: 35.3 % — ABNORMAL LOW (ref 36.0–46.0)
Hemoglobin: 12.4 g/dL (ref 12.0–15.0)
Lymphocytes Relative: 19 %
Lymphs Abs: 1.6 10*3/uL (ref 0.7–4.0)
MCH: 32.5 pg (ref 26.0–34.0)
MCHC: 35.1 g/dL (ref 30.0–36.0)
MCV: 92.4 fL (ref 78.0–100.0)
Monocytes Absolute: 0.9 10*3/uL (ref 0.1–1.0)
Monocytes Relative: 11 %
Neutro Abs: 5.6 10*3/uL (ref 1.7–7.7)
Neutrophils Relative %: 69 %
Platelets: 162 10*3/uL (ref 150–400)
RBC: 3.82 MIL/uL — ABNORMAL LOW (ref 3.87–5.11)
RDW: 13.2 % (ref 11.5–15.5)
WBC: 8.1 10*3/uL (ref 4.0–10.5)

## 2016-07-30 LAB — RAPID URINE DRUG SCREEN, HOSP PERFORMED
Amphetamines: NOT DETECTED
Barbiturates: POSITIVE — AB
Benzodiazepines: NOT DETECTED
Cocaine: NOT DETECTED
Opiates: NOT DETECTED
Tetrahydrocannabinol: NOT DETECTED

## 2016-07-30 LAB — URINE MICROSCOPIC-ADD ON: RBC / HPF: NONE SEEN RBC/hpf (ref 0–5)

## 2016-07-30 LAB — URINALYSIS, ROUTINE W REFLEX MICROSCOPIC
Bilirubin Urine: NEGATIVE
Glucose, UA: 100 mg/dL — AB
Ketones, ur: NEGATIVE mg/dL
Leukocytes, UA: NEGATIVE
Nitrite: NEGATIVE
Protein, ur: 30 mg/dL — AB
Specific Gravity, Urine: 1.01 (ref 1.005–1.030)
pH: 6 (ref 5.0–8.0)

## 2016-07-30 LAB — CK: Total CK: 78 U/L (ref 38–234)

## 2016-07-30 LAB — PHENYTOIN LEVEL, TOTAL: Phenytoin Lvl: <2.5 ug/mL — ABNORMAL LOW (ref 10.0–20.0)

## 2016-07-30 LAB — LIPASE, BLOOD: Lipase: 55 U/L — ABNORMAL HIGH (ref 11–51)

## 2016-07-30 LAB — ETHANOL: Alcohol, Ethyl (B): 7 mg/dL — ABNORMAL HIGH (ref <5)

## 2016-07-30 LAB — MAGNESIUM: Magnesium: 2 mg/dL (ref 1.7–2.4)

## 2016-07-30 LAB — TROPONIN I: Troponin I: <0.03 ng/mL (ref <0.03)

## 2016-07-30 MED ORDER — POTASSIUM CHLORIDE 10 MEQ/100ML IV SOLN
10.0000 meq | Freq: Once | INTRAVENOUS | Status: AC
  Administered 2016-07-30: 10 meq via INTRAVENOUS
  Filled 2016-07-30: qty 100

## 2016-07-30 MED ORDER — SODIUM CHLORIDE 0.9 % IV SOLN
INTRAVENOUS | Status: DC
  Administered 2016-07-30: via INTRAVENOUS

## 2016-07-30 MED ORDER — POTASSIUM CHLORIDE CRYS ER 20 MEQ PO TBCR
40.0000 meq | EXTENDED_RELEASE_TABLET | Freq: Once | ORAL | Status: AC
  Administered 2016-07-30: 40 meq via ORAL
  Filled 2016-07-30: qty 2

## 2016-07-30 MED ORDER — LORAZEPAM 1 MG PO TABS: 1.0000 mg | ORAL_TABLET | Freq: Four times a day (QID) | ORAL | Status: DC | PRN

## 2016-07-30 MED ORDER — SODIUM CHLORIDE 0.9 % IV BOLUS (SEPSIS)
1000.0000 mL | Freq: Once | INTRAVENOUS | Status: AC
  Administered 2016-07-30: 1000 mL via INTRAVENOUS

## 2016-07-30 MED ORDER — LORAZEPAM 2 MG/ML IJ SOLN: 1.0000 mg | Freq: Four times a day (QID) | INTRAMUSCULAR | Status: DC | PRN

## 2016-07-30 MED ORDER — ADULT MULTIVITAMIN W/MINERALS CH
1.0000 | ORAL_TABLET | Freq: Every day | ORAL | Status: DC
  Administered 2016-07-31 – 2016-08-02 (×4): 1 via ORAL
  Filled 2016-07-30 (×3): qty 1

## 2016-07-30 MED ORDER — THIAMINE HCL 100 MG/ML IJ SOLN: 100.0000 mg | Freq: Every day | INTRAMUSCULAR | Status: DC

## 2016-07-30 MED ORDER — VITAMIN B-1 100 MG PO TABS
100.0000 mg | ORAL_TABLET | Freq: Every day | ORAL | Status: DC
  Administered 2016-07-31 – 2016-08-02 (×4): 100 mg via ORAL
  Filled 2016-07-30 (×3): qty 1

## 2016-07-30 MED ORDER — FOLIC ACID 1 MG PO TABS
1.0000 mg | ORAL_TABLET | Freq: Every day | ORAL | Status: DC
  Administered 2016-07-31 – 2016-08-02 (×4): 1 mg via ORAL
  Filled 2016-07-30 (×3): qty 1

## 2016-07-30 NOTE — ED Provider Notes (Signed)
[facility] DEPT Provider Note   CSN: [id number] Arrival date & time: 07/30/16  1559     History   Chief Complaint Chief Complaint  Patient presents with  . Dizziness    HPI [name] is a 60 y.o. female.  HPI Pt was seen at 1655. Per pt, c/o gradual onset and persistence of constant "dizziness" that began 3 days ago. Describes the dizziness as "everything moving around," worse when she walks or turns her head side to side. Has been associated with multiple intermittent episodes of N/V and fall x1 two days ago. Has been associated with poor PO intake for the past 3 days. Denies LOC/syncope, no AMS, no neck or back pain, no CP/palpitations, no SOB/cough, no abd pain, no diarrhea, no black or blood in stools or emesis, no new focal motor weakness.    Past Medical History:  Diagnosis Date  . Alcohol abuse   . Chronic abdominal pain   . Chronic diarrhea   . Contracture of muscle of hand    right  . HTN (hypertension)   . PUD (peptic ulcer disease)    remote  . Seizures ([place])    since stroke, no recent seizures  . Shortness of breath    with exertion  . Stroke [facility])    age 41, required brain surgery    Patient Active Problem List   Diagnosis Date Noted  . Anemia 11/25/2015  . GIB (gastrointestinal bleeding) 10/16/2015  . Abnormal LFTs   . Hematemesis with nausea   . ETOH abuse   . Thrombocytopenia ([place])   . GI bleed 10/14/2015  . Upper GI bleed 10/18/2014  . Hematemesis 10/18/2014  . Reflux esophagitis   . Alcohol intoxication ([place]) 10/10/2014  . Sinus tachycardia ([place]) 10/10/2014  . Hypokalemia 10/10/2014  . Seizure disorder ([place]) 10/10/2014  . History of rectal bleeding 09/05/2012  . Chronic diarrhea 09/05/2012  . Alcohol abuse 09/05/2012  . Vomiting 09/05/2012  . Esophageal dysphagia 09/05/2012  . GERD (gastroesophageal reflux disease) 09/05/2012  . Upper abdominal pain 09/05/2012  . FX CLOSED TIBIA NOS 07/27/2007  . STROKE-With also h/o  Aneurysm clipping? 07/25/2007  . SEIZURES 07/25/2007  . History of cardiovascular disorder 07/25/2007    Past Surgical History:  Procedure Laterality Date  . ABDOMINAL HYSTERECTOMY  2004   complete  . BRAIN SURGERY     age 9, stroke  . COLONOSCOPY WITH PROPOFOL  10/03/2012   Dr. [name]: moderate diverticulosis, small internal hemorrhoids, TUBULAR ADENOMA. Next colonoscopy 2023 per Dr. [name].  . ESOPHAGOGASTRODUODENOSCOPY N/A 10/18/2014   Dr. Gala Romney during inpatient hospitalization: severe exudative/erosive reflux esophagitis as source of trivial upper GI bleed. No varices   . ESOPHAGOGASTRODUODENOSCOPY (EGD) WITH PROPOFOL  10/03/2012   Dr. [name]: stricture at [place] junction s/p dilation, mild gastritis negative H.pylori   . right tib-fib fracture  2008  . SAVORY DILATION  10/03/2012   Procedure: SAVORY DILATION;  Surgeon: Danie Binder, MD;  Location: [facility];  Service: Endoscopy;  Laterality: N/A;  started at 1303,  dilated 12.8-16mm    OB History    Gravida Para Term Preterm AB Living   4 3 3   1 3    SAB TAB Ectopic Multiple Live Births   1               Home Medications    Prior to Admission medications   Medication Sig Start Date End Date Taking? Authorizing Provider  albuterol (PROVENTIL HFA;VENTOLIN HFA) 108 (90 BASE) MCG/ACT  inhaler Inhale 2 puffs into the lungs every 6 (six) hours as needed. FOR SHORTNESS OF BREATH AND/OR WHEEZING    Historical Provider, MD  HYDROcodone-acetaminophen (NORCO/VICODIN) 5-325 MG tablet Take 1 tablet by mouth every 6 (six) hours as needed for moderate pain (Must last 14 days.Do not take and drive a car or use machinery.). 07/07/16   Sanjuana Kava, MD  ipratropium-albuterol (DUONEB) 0.5-2.5 (3) MG/3ML SOLN Take 3 mLs by nebulization every 6 (six) hours as needed (for shortness of breath/wheezing).     Historical Provider, MD  lisinopril-hydrochlorothiazide (PRINZIDE,ZESTORETIC) 20-12.5 MG per tablet Take 1 tablet by mouth daily.    Historical  Provider, MD  omeprazole (PRILOSEC) 20 MG capsule Take 1 capsule (20 mg total) by mouth 2 (two) times daily before a meal. 11/25/15   [name], PA-C  phenytoin (DILANTIN) 100 MG ER capsule Take 100 mg by mouth 3 (three) times daily.    Historical Provider, MD  solifenacin (VESICARE) 5 MG tablet Take 5 mg by mouth daily.    Historical Provider, MD    Family History Family History  Problem Relation Age of Onset  . Liver disease Sister     etoh  . Colon cancer Neg Hx     Social History Social History  Substance Use Topics  . Smoking status: Former Smoker    Packs/day: 0.04    Years: 9.00    Types: Cigarettes    Quit date: 07/28/2014  . Smokeless tobacco: Never Used  . Alcohol use Yes     Comment: Pt last drink on Monday     Allergies   Other   Review of Systems Review of Systems ROS: Statement: All systems negative except as marked or noted in the HPI; Constitutional: Negative for fever and chills. ; ; Eyes: Negative for eye pain, redness and discharge. ; ; ENMT: Negative for ear pain, hoarseness, nasal congestion, sinus pressure and sore throat. ; ; Cardiovascular: Negative for chest pain, palpitations, diaphoresis, dyspnea and peripheral edema. ; ; Respiratory: Negative for cough, wheezing and stridor. ; ; Gastrointestinal: +N/V. Negative for diarrhea, abdominal pain, blood in stool, hematemesis, jaundice and rectal bleeding. . ; ; Genitourinary: Negative for dysuria, flank pain and hematuria. ; ; Musculoskeletal: Negative for back pain and neck pain. Negative for swelling and deformity.; ; Skin: +abrasion. Negative for pruritus, rash, blisters, bruising and skin lesion.; ; Neuro: +"spinning sensation." Negative for headache, lightheadedness and neck stiffness. Negative for weakness, altered level of consciousness, altered mental status, extremity weakness, paresthesias, involuntary movement, seizure and syncope.      Physical Exam Updated Vital Signs BP 100/62 (BP  Location: Left Arm)   Pulse 76   Temp 97.5 F (36.4 C) (Oral)   Resp 18   Ht 5\' 4"  (1.626 m)   Wt 144 lb (65.3 kg)   SpO2 100%   BMI 24.72 kg/m    Patient Vitals for the past 24 hrs:  BP Temp Temp src Pulse Resp SpO2 Height Weight  07/30/16 1830 129/60 - - 92 19 98 % - -  07/30/16 1819 116/64 - - 89 17 100 % - -  07/30/16 1800 107/58 - - 89 17 99 % - -  07/30/16 1627 100/62 97.5 F (36.4 C) Oral 76 18 100 % 5\' 4"  (1.626 m) 144 lb (65.3 kg)  07/30/16 1618 - - - - - - 5\' 4"  (1.626 m) 144 lb (65.3 kg)     18:17 Orthostatic Vital Signs CS  Orthostatic Lying  BP- Lying: 116/64  Pulse- Lying: 90      Orthostatic Sitting  BP- Sitting: 110/78  Pulse- Sitting: 99      Orthostatic Standing at 0 minutes  BP- Standing at 0 minutes: 98/78  Pulse- Standing at 0 minutes: 89    Physical Exam 1700: Physical examination:  Nursing notes reviewed; Vital signs and O2 SAT reviewed;  Constitutional: Thin, In no acute distress; Head:  Normocephalic, +superficial abrasion left forehead.; Eyes: EOMI, PERRL, No scleral icterus; ENMT: Mouth and pharynx normal, Mucous membranes dry and cracked; Neck: Supple, Full range of motion, No lymphadenopathy; Cardiovascular: Regular rate and rhythm, No gallop; Respiratory: Breath sounds clear & equal bilaterally, No wheezes.  Speaking full sentences with ease, Normal respiratory effort/excursion; Chest: Nontender, Movement normal; Abdomen: Soft, Nontender, Nondistended, Normal bowel sounds; Genitourinary: No CVA tenderness; Spine:  No midline CS, TS, LS tenderness.;; Extremities: Pulses normal, No tenderness, No edema, No calf edema or asymmetry.; Neuro: AA&Ox3, Major CN grossly intact. No facial droop. +right horizontal end gaze fatigable nystagmus which reproduces pt's symptoms.  Speech clear. +RUE contracted with RLE weakness per baseline, otherwise no new gross focal motor deficits in extremities.; Skin: Color normal, Warm, Dry.   ED Treatments / Results    Labs (all labs ordered are listed, but only abnormal results are displayed)   EKG  EKG Interpretation  Date/Time:  Friday July 30 2016 16:25:19 EDT Ventricular Rate:  90 PR Interval:  142 QRS Duration: 84 QT Interval:  412 QTC Calculation: 504 R Axis:   27 Text Interpretation:  Normal sinus rhythm Prolonged QT Artifact When compared with ECG of 10/14/2015 No significant change was found Confirmed by [facility]  MD, Nunzio Cory [phone]) on 07/30/2016 5:34:13 PM       Radiology   Procedures Procedures (including critical care time)  Medications Ordered in ED Medications  sodium chloride 0.9 % bolus 1,000 mL (not administered)  0.9 %  sodium chloride infusion (not administered)     Initial Impression / Assessment and Plan / ED Course  I have reviewed the triage vital signs and the nursing notes.  Pertinent labs & imaging results that were available during my care of the patient were reviewed by me and considered in my medical decision making (see chart for details).  MDM Reviewed: previous chart, nursing note and vitals Reviewed previous: labs and ECG Interpretation: labs, ECG, x-ray and CT scan Total time providing critical care: < 30 minutes. This excludes time spent performing separately reportable procedures and services. Consults: admitting MD   CRITICAL CARE Performed by: Alfonzo Feller Total critical care time: 35 minutes Critical care time was exclusive of separately billable procedures and treating other patients. Critical care was necessary to treat or prevent imminent or life-threatening deterioration. Critical care was time spent personally by me on the following activities: development of treatment plan with patient and/or surrogate as well as nursing, discussions with consultants, evaluation of patient's response to treatment, examination of patient, obtaining history from patient or surrogate, ordering and performing treatments and interventions,  ordering and review of laboratory studies, ordering and review of radiographic studies, pulse oximetry and re-evaluation of patient's condition.  Results for orders placed or performed during the hospital encounter of 07/30/16  CBC with Differential  Result Value Ref Range   WBC 8.1 4.0 - 10.5 K/uL   RBC 3.82 (L) 3.87 - 5.11 MIL/uL   Hemoglobin 12.4 12.0 - 15.0 g/dL   HCT 35.3 (L) 36.0 - 46.0 %   MCV 92.4 78.0 -  100.0 fL   MCH 32.5 26.0 - 34.0 pg   MCHC 35.1 30.0 - 36.0 g/dL   RDW 13.2 11.5 - 15.5 %   Platelets 162 150 - 400 K/uL   Neutrophils Relative % 69 %   Neutro Abs 5.6 1.7 - 7.7 K/uL   Lymphocytes Relative 19 %   Lymphs Abs 1.6 0.7 - 4.0 K/uL   Monocytes Relative 11 %   Monocytes Absolute 0.9 0.1 - 1.0 K/uL   Eosinophils Relative 1 %   Eosinophils Absolute 0.1 0.0 - 0.7 K/uL   Basophils Relative 0 %   Basophils Absolute 0.0 0.0 - 0.1 K/uL  Comprehensive metabolic panel  Result Value Ref Range   Sodium 129 (L) 135 - 145 mmol/L   Potassium 2.0 (LL) 3.5 - 5.1 mmol/L   Chloride 67 (L) 101 - 111 mmol/L   CO2 38 (H) 22 - 32 mmol/L   Glucose, Bld 114 (H) 65 - 99 mg/dL   BUN 40 (H) 6 - 20 mg/dL   Creatinine, Ser 9.23 (H) 0.44 - 1.00 mg/dL   Calcium 10.0 8.9 - 10.3 mg/dL   Total Protein 8.9 (H) 6.5 - 8.1 g/dL   Albumin 4.3 3.5 - 5.0 g/dL   AST 46 (H) 15 - 41 U/L   ALT 18 14 - 54 U/L   Alkaline Phosphatase 75 38 - 126 U/L   Total Bilirubin 2.3 (H) 0.3 - 1.2 mg/dL   GFR calc non Af Amer 4 (L) >60 mL/min   GFR calc Af Amer 5 (L) >60 mL/min   Anion gap 24 (H) 5 - 15  Troponin I  Result Value Ref Range   Troponin I <0.03 <0.03 ng/mL  Urinalysis, Routine w reflex microscopic  Result Value Ref Range   Color, Urine YELLOW YELLOW   APPearance HAZY (A) CLEAR   Specific Gravity, Urine 1.010 1.005 - 1.030   pH 6.0 5.0 - 8.0   Glucose, UA 100 (A) NEGATIVE mg/dL   Hgb urine dipstick MODERATE (A) NEGATIVE   Bilirubin Urine NEGATIVE NEGATIVE   Ketones, ur NEGATIVE NEGATIVE mg/dL     Protein, ur 30 (A) NEGATIVE mg/dL   Nitrite NEGATIVE NEGATIVE   Leukocytes, UA NEGATIVE NEGATIVE  Urine rapid drug screen (hosp performed)  Result Value Ref Range   Opiates NONE DETECTED NONE DETECTED   Cocaine NONE DETECTED NONE DETECTED   Benzodiazepines NONE DETECTED NONE DETECTED   Amphetamines NONE DETECTED NONE DETECTED   Tetrahydrocannabinol NONE DETECTED NONE DETECTED   Barbiturates POSITIVE (A) NONE DETECTED  Lipase, blood  Result Value Ref Range   Lipase 55 (H) 11 - 51 U/L  Magnesium  Result Value Ref Range   Magnesium 2.0 1.7 - 2.4 mg/dL  Ethanol  Result Value Ref Range   Alcohol, Ethyl (B) 7 (H) <5 mg/dL  Phenytoin level, total  Result Value Ref Range   Phenytoin Lvl <2.5 (L) 10.0 - 20.0 ug/mL  Urine microscopic-add on  Result Value Ref Range   Squamous Epithelial / LPF 0-5 (A) NONE SEEN   WBC, UA 0-5 0 - 5 WBC/hpf   RBC / HPF NONE SEEN 0 - 5 RBC/hpf   Bacteria, UA RARE (A) NONE SEEN   Dg Chest 2 View Result Date: 07/30/2016 CLINICAL DATA:  Initial evaluation for acute dizziness, nausea, vomiting. EXAM: CHEST  2 VIEW COMPARISON:  Prior radiograph from 10/14/2015. FINDINGS: The cardiac and mediastinal silhouettes are stable in size and contour, and remain within normal limits. The lungs are normally inflated.  No airspace consolidation, pleural effusion, or pulmonary edema is identified. There is no pneumothorax. No acute osseous abnormality identified. Degenerative changes noted about the shoulders bilaterally. A a es at the distal body a peak in the out at IMPRESSION: No active cardiopulmonary disease. Electronically Signed   By: Jeannine Boga M.D.   On: 07/30/2016 17:55   Ct Head Wo Contrast Result Date: 07/30/2016 CLINICAL DATA:  Standing sensation with fall. Nausea vomiting. Fell in the bathroom on Wednesday. Hit left side of forehead on edge of bathtub. EXAM: CT HEAD WITHOUT CONTRAST TECHNIQUE: Contiguous axial images were obtained from the base of the  skull through the vertex without intravenous contrast. COMPARISON:  10/10/2014. 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 Stable appearance left frontal encephalomalacia with ex vacuo dilatation frontal horn left lateral ventricle. Encephalomalacia in the inferior right frontal lobe is unchanged. Diffuse loss of parenchymal volume is consistent with atrophy. Patchy low attenuation in the deep hemispheric and periventricular white matter is nonspecific, but likely reflects chronic microvascular ischemic demyelination. Vascular: Status post clipping of right ICA aneurysm. Skull: No evidence for fracture. No worrisome lytic or sclerotic lesion. Sinuses/Orbits: The visualized paranasal sinuses and mastoid air cells are clear. Visualized portions of the globes and intraorbital fat are unremarkable. Other: None. IMPRESSION: Stable exam.  No acute intracranial abnormality. Bifrontal chronic infarcts, left greater than right. Status post right ICA aneurysm clipping. Atrophy with chronic small vessel white matter ischemic disease. Electronically Signed   By: Misty Stanley M.D.   On: 07/30/2016 18:15   Results for DEBRO, HILT (MRN [id number]) as of 07/30/2016 20:09  Ref. Range 10/14/2015 05:30 10/14/2015 06:32 10/15/2015 04:23 07/30/2016 17:01  BUN Latest Ref Range: 6 - 20 mg/dL 10 8 9  40 (H)  Creatinine Latest Ref Range: 0.44 - 1.00 mg/dL 0.60 0.80 0.60 9.23 (H)    2020:  IVF boluses given for hypotension with improvement. Potassium repleted IV and PO. New hyponatremia and ARF on labs; IVF continues. Dx and testing d/w pt and family.  Questions answered.  Verb understanding, agreeable to admit.  T/C to [facility]. Marin Comment, case discussed, including:  HPI, pertinent PM/SHx, VS/PE, dx testing, ED course and treatment:  Agreeable to admit, requests he will come to the ED for evaluation.    Final Clinical Impressions(s) / ED Diagnoses   Final diagnoses:    None    New Prescriptions New Prescriptions   No medications on file     Francine Graven, DO 08/03/16 1928

## 2016-07-30 NOTE — ED Notes (Signed)
Lab at the bedside 

## 2016-07-30 NOTE — H&P (Signed)
History and Physical    DAKODA [name] [id number] DOB: [birth year] DOA: 07/30/2016  Referring MD/NP/PA: Francine Graven, DO PCP: [name], MD Outpatient Specialists:  1. Gastroenterology: Danie Binder, MD  Patient coming from: Home  Chief Complaint: Dizziness  HPI: [name] is a 60 y.o. female with medical history significant of seizures, chronic diarrhea, chronic abd pain, and alcohol abuse presents to the ED with complaints of constant dizziness that onset 3 days ago. She described her dizziness as vertigo, and clearly with positional changes.  She admits to one episode of nausea and vomiting 2 days ago. She normally takes potassium on a daily basis, but has ran out recently. She also has diarrhea on a daily basis. She denies any abd pain.  ED Course: While in the ED, sodium 129, potassium 2.0, calcium wnl, Mg wnl, anion gap 24, lipase 55, AST 46, and T. Bili 2.3. UA negative. EKG shows prolonged QT with QTc of 522ms. CT head unremarkable. CXR was unremarkable. Urine cultures pending. Will admit for further evaluation of her dizziness.  Review of Systems: As per HPI otherwise 10 point review of systems negative.    Past Medical History:  Diagnosis Date  . Alcohol abuse   . Chronic abdominal pain   . Chronic diarrhea   . Contracture of muscle of hand    right  . HTN (hypertension)   . PUD (peptic ulcer disease)    remote  . Seizures ([place])    since stroke, no recent seizures  . Shortness of breath    with exertion  . Stroke [facility])    age 88, required brain surgery    Past Surgical History:  Procedure Laterality Date  . ABDOMINAL HYSTERECTOMY  2004   complete  . BRAIN SURGERY     age 69, stroke  . COLONOSCOPY WITH PROPOFOL  10/03/2012   Dr. [name]: moderate diverticulosis, small internal hemorrhoids, TUBULAR ADENOMA. Next colonoscopy 2023 per Dr. [name].  . ESOPHAGOGASTRODUODENOSCOPY N/A 10/18/2014   Dr. Gala Romney during inpatient hospitalization: severe  exudative/erosive reflux esophagitis as source of trivial upper GI bleed. No varices   . ESOPHAGOGASTRODUODENOSCOPY (EGD) WITH PROPOFOL  10/03/2012   Dr. 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 right tib-fib fracture  2008  . SAVORY DILATION  10/03/2012   Procedure: SAVORY DILATION;  Surgeon: Danie Binder, MD;  Location: [facility];  Service: Endoscopy;  Laterality: N/A;  started at 1303,  dilated 12.8-16mm     reports that she quit smoking about 2 years ago. Her smoking use included Cigarettes. She has a 0.36 pack-year smoking history. She has never used smokeless tobacco. She reports that she drinks alcohol. She reports that she does not use drugs.  Allergies  Allergen Reactions  . Other     Unable to recall med    Family History  Problem Relation Age of Onset  . Liver disease Sister     etoh  . Colon cancer Neg Hx     Prior to Admission medications   Medication Sig Start Date End Date Taking? Authorizing Provider  albuterol (PROVENTIL HFA;VENTOLIN HFA) 108 (90 BASE) MCG/ACT inhaler Inhale 2 puffs into the lungs every 6 (six) hours as needed. FOR SHORTNESS OF BREATH AND/OR WHEEZING    Historical Provider, MD  HYDROcodone-acetaminophen (NORCO/VICODIN) 5-325 MG tablet Take 1 tablet by mouth every 6 (six) hours as needed for moderate pain (Must last 14 days.Do not take and drive a car or use machinery.).  07/07/16   Sanjuana Kava, MD  ipratropium-albuterol (DUONEB) 0.5-2.5 (3) MG/3ML SOLN Take 3 mLs by nebulization every 6 (six) hours as needed (for shortness of breath/wheezing).     Historical Provider, MD  lisinopril-hydrochlorothiazide (PRINZIDE,ZESTORETIC) 20-12.5 MG per tablet Take 1 tablet by mouth daily.    Historical Provider, MD  omeprazole (PRILOSEC) 20 MG capsule Take 1 capsule (20 mg total) by mouth 2 (two) times daily before a meal. 11/25/15   [name], PA-C  phenytoin (DILANTIN) 100 MG ER capsule Take 100 mg by mouth 3 (three)  times daily.    Historical Provider, MD  solifenacin (VESICARE) 5 MG tablet Take 5 mg by mouth daily.    Historical Provider, MD    Physical Exam: Vitals:   07/30/16 1627 07/30/16 1800 07/30/16 1819 07/30/16 1830  BP: 100/62 107/58 116/64 129/60  Pulse: 76 89 89 92  Resp: 18 17 17 19   Temp: 97.5 F (36.4 C)     TempSrc: Oral     SpO2: 100% 99% 100% 98%  Weight: 65.3 kg (144 lb)     Height: 5\' 4"  (1.626 m)         Constitutional: NAD, calm, comfortable Vitals:   07/30/16 1627 07/30/16 1800 07/30/16 1819 07/30/16 1830  BP: 100/62 107/58 116/64 129/60  Pulse: 76 89 89 92  Resp: 18 17 17 19   Temp: 97.5 F (36.4 C)     TempSrc: Oral     SpO2: 100% 99% 100% 98%  Weight: 65.3 kg (144 lb)     Height: 5\' 4"  (1.626 m)      Eyes: PERRL, lids and conjunctivae normal ENMT: Mucous membranes are moist. Posterior pharynx clear of any exudate or lesions.Normal dentition.  Neck: normal, supple, no masses, no thyromegaly Respiratory: clear to auscultation bilaterally, no wheezing, no crackles. Normal respiratory effort. No accessory muscle use.  Cardiovascular: Regular rate and rhythm, no murmurs / rubs / gallops. No extremity edema. 2+ pedal pulses. No carotid bruits.  Abdomen: no tenderness, no masses palpated. No hepatosplenomegaly. Bowel sounds positive.  Musculoskeletal: no clubbing / cyanosis. No joint deformity upper and lower extremities. Good ROM, no contractures. Normal muscle tone.  Skin: no rashes, lesions, ulcers. No induration Neurologic: CN 2-12 grossly intact. Sensation intact, DTR normal. Strength 5/5 in all 4.  Psychiatric: Normal judgment and insight. Alert and oriented x 3. Normal mood.    Labs on Admission: I have personally reviewed following labs and imaging studies  CBC:  Recent Labs Lab 07/30/16 1701  WBC 8.1  NEUTROABS 5.6  HGB 12.4  HCT 35.3*  MCV 92.4  PLT 0000000   Basic Metabolic Panel:  Recent Labs Lab 07/30/16 1701 07/30/16 1814  NA 129*  --     K 2.0*  --   CL 67*  --   CO2 38*  --   GLUCOSE 114*  --   BUN 40*  --   CREATININE 9.23*  --   CALCIUM 10.0  --   MG  --  2.0   GFR: Estimated Creatinine Clearance: 5.7 mL/min (by C-G formula based on SCr of 9.23 mg/dL (H)). Liver Function Tests:  Recent Labs Lab 07/30/16 1701  AST 46*  ALT 18  ALKPHOS 75  BILITOT 2.3*  PROT 8.9*  ALBUMIN 4.3    Recent Labs Lab 07/30/16 1701  LIPASE 55*   Cardiac Enzymes:  Recent Labs Lab 07/30/16 1701  TROPONINI <0.03   Urine analysis:    Component Value Date/Time   COLORURINE YELLOW  07/30/2016 1930   APPEARANCEUR HAZY (A) 07/30/2016 1930   LABSPEC 1.010 07/30/2016 1930   PHURINE 6.0 07/30/2016 1930   GLUCOSEU 100 (A) 07/30/2016 1930   HGBUR MODERATE (A) 07/30/2016 1930   BILIRUBINUR NEGATIVE 07/30/2016 [place] NEGATIVE 07/30/2016 1930   PROTEINUR 30 (A) 07/30/2016 1930   UROBILINOGEN 0.2 12/26/2014 1927   NITRITE NEGATIVE 07/30/2016 1930   LEUKOCYTESUR NEGATIVE 07/30/2016 1930   Radiological Exams on Admission: Dg Chest 2 View  Result Date: 07/30/2016 CLINICAL DATA:  Initial evaluation for acute dizziness, nausea, vomiting. EXAM: CHEST  2 VIEW COMPARISON:  Prior radiograph from 10/14/2015. FINDINGS: The cardiac and mediastinal silhouettes are stable in size and contour, and remain within normal limits. The lungs are normally inflated. No airspace consolidation, pleural effusion, or pulmonary edema is identified. There is no pneumothorax. No acute osseous abnormality identified. Degenerative changes noted about the shoulders bilaterally. A a es at the distal body a peak in the out at IMPRESSION: No active cardiopulmonary disease. Electronically Signed   By: Jeannine Boga M.D.   On: 07/30/2016 17:55   Ct Head Wo Contrast  Result Date: 07/30/2016 CLINICAL DATA:  Standing sensation with fall. Nausea vomiting. Fell in the bathroom on Wednesday. Hit left side of forehead on edge of bathtub. EXAM: CT HEAD  WITHOUT CONTRAST TECHNIQUE: Contiguous axial images were obtained from the base of the skull through the vertex without intravenous contrast. COMPARISON:  10/10/2014. 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 Stable appearance left frontal encephalomalacia with ex vacuo dilatation frontal horn left lateral ventricle. Encephalomalacia in the inferior right frontal lobe is unchanged. Diffuse loss of parenchymal volume is consistent with atrophy. Patchy low attenuation in the deep hemispheric and periventricular white matter is nonspecific, but likely reflects chronic microvascular ischemic demyelination. Vascular: Status post clipping of right ICA aneurysm. Skull: No evidence for fracture. No worrisome lytic or sclerotic lesion. Sinuses/Orbits: The visualized paranasal sinuses and mastoid air cells are clear. Visualized portions of the globes and intraorbital fat are unremarkable. Other: None. IMPRESSION: Stable exam.  No acute intracranial abnormality. Bifrontal chronic infarcts, left greater than right. Status post right ICA aneurysm clipping. Atrophy with chronic small vessel white matter ischemic disease. Electronically Signed   By: Misty Stanley M.D.   On: 07/30/2016 18:15    EKG: Independently reviewed. Normal sinus rhythm with prolonged QT  Assessment/Plan Principal Problem:   Hypokalemia Active Problems:   STROKE-With also h/o Aneurysm clipping?   Chronic diarrhea   Alcohol abuse   Seizure disorder ([place])   AKI (acute kidney injury) ([place])     1. Hypokalemia secondary to chronic diarrhea. Potassium is 2.0. Calcium wnl. EKG shows prolonged QT. Recheck labs in the am.  Will need to admit her to the ICU, monitor and give supplement.  Be mindful of acute renal failure.  2. Hypovolemic Hyponatremia:  Likely from diarrhea and HCTZ from her BP meds.  Will replete with IV NS. Recheck BMP in the am. 3. AKI. Suspicious for pre renal.  But will  obtain renal US to r/out post renal hydronephrosis.  Suspect due to dehydration from chronic diarrhea and diuretic, worsened by ACE I ,  Will hold ACE I.  BUN 40 and Cr. 9.23. Monitor input and output. If she doesn't improve, will need nephrology consultation.  4. HTN. Hold lisinopril. Monitor carefully. 5. Alcohol abuse. Start on CIWA protocol. 6. Seizure disorder. Continue dilantin. Check level, check CPK as hypokalemia can  precipitate rhabdo and aggravated renal failure.  7. Stroke with hx of Aneurysm clipping?Marland Kitchen Noted   DVT prophylaxis: Heparin Code Status: Full Family Communication: Discussed with patient and friend at bedside Disposition Plan: Discharge once improved Consults called: None Admission status: Admit to inpatient   Orvan Falconer, MD   FACP [facility] If 7PM-7AM, please contact night-coverage [email or web address] Password [email or web address]  07/30/2016, 8:24 PM   By signing my name below, I, Hilbert Odor, attest that this documentation has been prepared under the direction and in the presence of Orvan Falconer, MD. Electronically signed: Hilbert Odor, Scribe.  07/30/16, 8:29 PM

## 2016-07-30 NOTE — ED Notes (Signed)
Pt reports she was a heavy drinker but would not elaborate as to how much she drank. Pt states she quit drinking on Monday. Pt reported tremors on Tuesday but has not had any since.

## 2016-07-30 NOTE — ED Notes (Signed)
Potassium infusing with NS, pt on the monitor

## 2016-07-30 NOTE — ED Triage Notes (Signed)
Pt reports dizziness that started on Tuesday and a fall on Wed night.Pt denies LOC with fall, small abrasion noted to L forehead. Pt denies blood thinners.

## 2016-07-30 NOTE — ED Notes (Signed)
CRITICAL VALUE ALERT  Critical value received:  Potassium 2.0  Date of notification:  07/30/16  Time of notification:  [id number]  Critical value read back:Yes.    Nurse who received alert:  RCockram   MD notified (1st page):  Thurnell Garbe

## 2016-07-31 ENCOUNTER — Inpatient Hospital Stay (HOSPITAL_COMMUNITY): Payer: Medicare Other

## 2016-07-31 LAB — BASIC METABOLIC PANEL
Anion gap: 11 (ref 5–15)
BUN: 33 mg/dL — ABNORMAL HIGH (ref 6–20)
CO2: 32 mmol/L (ref 22–32)
Calcium: 8.5 mg/dL — ABNORMAL LOW (ref 8.9–10.3)
Chloride: 91 mmol/L — ABNORMAL LOW (ref 101–111)
Creatinine, Ser: 3.51 mg/dL — ABNORMAL HIGH (ref 0.44–1.00)
GFR calc Af Amer: 15 mL/min — ABNORMAL LOW (ref >60)
GFR calc non Af Amer: 13 mL/min — ABNORMAL LOW (ref >60)
Glucose, Bld: 136 mg/dL — ABNORMAL HIGH (ref 65–99)
Potassium: 2.4 mmol/L — CL (ref 3.5–5.1)
Sodium: 134 mmol/L — ABNORMAL LOW (ref 135–145)

## 2016-07-31 LAB — COMPREHENSIVE METABOLIC PANEL
ALT: 19 U/L (ref 14–54)
AST: 43 U/L — ABNORMAL HIGH (ref 15–41)
Albumin: 3.4 g/dL — ABNORMAL LOW (ref 3.5–5.0)
Alkaline Phosphatase: 69 U/L (ref 38–126)
Anion gap: 12 (ref 5–15)
BUN: 26 mg/dL — ABNORMAL HIGH (ref 6–20)
CO2: 32 mmol/L (ref 22–32)
Calcium: 9.2 mg/dL (ref 8.9–10.3)
Chloride: 92 mmol/L — ABNORMAL LOW (ref 101–111)
Creatinine, Ser: 1.79 mg/dL — ABNORMAL HIGH (ref 0.44–1.00)
GFR calc Af Amer: 35 mL/min — ABNORMAL LOW (ref >60)
GFR calc non Af Amer: 30 mL/min — ABNORMAL LOW (ref >60)
Glucose, Bld: 96 mg/dL (ref 65–99)
Potassium: 2.5 mmol/L — CL (ref 3.5–5.1)
Sodium: 136 mmol/L (ref 135–145)
Total Bilirubin: 1.6 mg/dL — ABNORMAL HIGH (ref 0.3–1.2)
Total Protein: 7.6 g/dL (ref 6.5–8.1)

## 2016-07-31 LAB — MRSA PCR SCREENING: MRSA by PCR: NEGATIVE

## 2016-07-31 MED ORDER — POTASSIUM CHLORIDE CRYS ER 20 MEQ PO TBCR
40.0000 meq | EXTENDED_RELEASE_TABLET | Freq: Once | ORAL | Status: AC
  Administered 2016-07-31: 40 meq via ORAL
  Filled 2016-07-31: qty 2

## 2016-07-31 MED ORDER — PANTOPRAZOLE SODIUM 40 MG PO TBEC
40.0000 mg | DELAYED_RELEASE_TABLET | Freq: Every day | ORAL | Status: DC
  Administered 2016-07-31 – 2016-08-02 (×3): 40 mg via ORAL
  Filled 2016-07-31 (×3): qty 1

## 2016-07-31 MED ORDER — DARIFENACIN HYDROBROMIDE ER 7.5 MG PO TB24
7.5000 mg | ORAL_TABLET | Freq: Every day | ORAL | Status: DC
  Administered 2016-07-31 – 2016-08-02 (×3): 7.5 mg via ORAL
  Filled 2016-07-31 (×3): qty 1

## 2016-07-31 MED ORDER — ONDANSETRON HCL 4 MG/2ML IJ SOLN
INTRAMUSCULAR | Status: AC
  Filled 2016-07-31: qty 2

## 2016-07-31 MED ORDER — KCL IN DEXTROSE-NACL 40-5-0.9 MEQ/L-%-% IV SOLN
INTRAVENOUS | Status: DC
Start: 2016-07-31 — End: 2016-08-02
  Administered 2016-07-31: 17:00:00 via INTRAVENOUS
  Filled 2016-07-31 (×8): qty 1000

## 2016-07-31 MED ORDER — THIAMINE HCL 100 MG/ML IJ SOLN
Freq: Once | INTRAVENOUS | Status: AC
  Filled 2016-07-31: qty 1000

## 2016-07-31 MED ORDER — IPRATROPIUM-ALBUTEROL 0.5-2.5 (3) MG/3ML IN SOLN: 3.0000 mL | Freq: Four times a day (QID) | RESPIRATORY_TRACT | Status: DC | PRN

## 2016-07-31 MED ORDER — HYDROCODONE-ACETAMINOPHEN 5-325 MG PO TABS: 1.0000 | ORAL_TABLET | Freq: Four times a day (QID) | ORAL | Status: DC | PRN

## 2016-07-31 MED ORDER — SODIUM CHLORIDE 0.9% FLUSH
3.0000 mL | Freq: Two times a day (BID) | INTRAVENOUS | Status: DC
  Administered 2016-07-31 – 2016-08-01 (×4): 3 mL via INTRAVENOUS

## 2016-07-31 MED ORDER — KCL IN DEXTROSE-NACL 20-5-0.9 MEQ/L-%-% IV SOLN
INTRAVENOUS | Status: DC
  Administered 2016-07-31 (×2): via INTRAVENOUS

## 2016-07-31 MED ORDER — HEPARIN SODIUM (PORCINE) 5000 UNIT/ML IJ SOLN
5000.0000 [IU] | Freq: Three times a day (TID) | INTRAMUSCULAR | Status: DC
  Administered 2016-07-31 – 2016-08-02 (×8): 5000 [IU] via SUBCUTANEOUS
  Filled 2016-07-31 (×7): qty 1

## 2016-07-31 MED ORDER — PHENYTOIN SODIUM EXTENDED 100 MG PO CAPS
100.0000 mg | ORAL_CAPSULE | Freq: Three times a day (TID) | ORAL | Status: DC
  Administered 2016-07-31 – 2016-08-02 (×7): 100 mg via ORAL
  Filled 2016-07-31 (×7): qty 1

## 2016-07-31 MED ORDER — KCL IN DEXTROSE-NACL 40-5-0.9 MEQ/L-%-% IV SOLN
INTRAVENOUS | Status: AC
Start: 2016-07-31 — End: 2016-07-31
  Filled 2016-07-31: qty 2000

## 2016-07-31 MED ORDER — ALBUTEROL SULFATE HFA 108 (90 BASE) MCG/ACT IN AERS: 2.0000 | INHALATION_SPRAY | RESPIRATORY_TRACT | Status: DC | PRN

## 2016-07-31 MED ORDER — ONDANSETRON HCL 4 MG/2ML IJ SOLN
4.0000 mg | Freq: Four times a day (QID) | INTRAMUSCULAR | Status: DC | PRN
  Administered 2016-07-31: 4 mg via INTRAVENOUS

## 2016-07-31 MED ORDER — NEPRO/CARBSTEADY PO LIQD
237.0000 mL | Freq: Two times a day (BID) | ORAL | Status: DC
  Administered 2016-07-31 – 2016-08-01 (×3): 237 mL via ORAL

## 2016-07-31 NOTE — Consult Note (Signed)
[name] MRN: [id number] DOB/AGE: 22-Mar-1956 60 y.o. Primary Care Physician:FANTA,TESFAYE, MD Admit date: 07/30/2016 Chief Complaint:  Chief Complaint  Patient presents with  . Dizziness   HPI:  Pt is a 60 year old female with past medical history significant of seizures, chronic diarrhea, who presents to the ER with complaint of dizziness.  HPI dates back to  3 days ago when started feeling dizzy, pt correlates it being constant and associated with postural change. Pt also c/o nausea and emesis Pt upon evaluation in ER was found to have AKI and nephrology was consulted. Pt seen today in ICU. Pt feels better than yesterday NO c/o fever/cough/chills. NO c/o hematuria No c/o  NSAIDS use   Past Medical History:  Diagnosis Date  . Alcohol abuse   . Chronic abdominal pain   . Chronic diarrhea   . Contracture of muscle of hand    right  . HTN (hypertension)   . PUD (peptic ulcer disease)    remote  . Seizures ([facility])    since stroke, no recent seizures  . Shortness of breath    with exertion  . Stroke [facility])    age 34, required brain surgery        Family History  Problem Relation Age of Onset  . Liver disease Sister     etoh  . Colon cancer Neg Hx     Social History:  reports that she quit smoking about 2 years ago. Her smoking use included Cigarettes. She has a 0.36 pack-year smoking history. She has never used smokeless tobacco. She reports that she drinks alcohol. She reports that she does not use drugs.   Allergies:  Allergies  Allergen Reactions  . Other     Unable to recall med    Medications Prior to Admission  Medication Sig Dispense Refill  . albuterol (PROVENTIL HFA;VENTOLIN HFA) 108 (90 BASE) MCG/ACT inhaler Inhale 2 puffs into the lungs every 6 (six) hours as needed. FOR SHORTNESS OF BREATH AND/OR WHEEZING    . amLODipine (NORVASC) 5 MG tablet Take 5 mg by mouth daily.    Marland Kitchen ipratropium-albuterol (DUONEB) 0.5-2.5 (3) MG/3ML SOLN Take 3 mLs by  nebulization every 6 (six) hours as needed (for shortness of breath/wheezing).     Marland Kitchen lisinopril-hydrochlorothiazide (PRINZIDE,ZESTORETIC) 20-12.5 MG per tablet Take 1 tablet by mouth daily.    Marland Kitchen omeprazole (PRILOSEC) 20 MG capsule Take 1 capsule (20 mg total) by mouth 2 (two) times daily before a meal. 60 capsule 5  . phenytoin (DILANTIN) 100 MG ER capsule Take 100 mg by mouth 3 (three) times daily.    . solifenacin (VESICARE) 5 MG tablet Take 5 mg by mouth daily.    Marland Kitchen HYDROcodone-acetaminophen (NORCO/VICODIN) 5-325 MG tablet Take 1 tablet by mouth every 6 (six) hours as needed for moderate pain (Must last 14 days.Do not take and drive a car or use machinery.). 52 tablet 0       NFA:OZHYQ from the symptoms mentioned above,there are no other symptoms referable to all systems reviewed.  . darifenacin  7.5 mg Oral Daily  . feeding supplement (NEPRO CARB STEADY)  237 mL Oral BID BM  . folic acid  1 mg Oral Daily  . heparin  5,000 Units Subcutaneous Q8H  . multivitamin with minerals  1 tablet Oral Daily  . pantoprazole  40 mg Oral Daily  . phenytoin  100 mg Oral TID  . sodium chloride flush  3 mL Intravenous Q12H  . thiamine  100  mg Oral Daily   Or  . thiamine  100 mg Intravenous Daily        Physical Exam: Vital signs in last 24 hours: Temp:  [97.4 F (36.3 C)-99.9 F (37.7 C)] 97.4 F (36.3 C) (10/14 1125) Pulse Rate:  [76-96] 90 (10/13 2330) Resp:  [14-23] 17 (10/14 1200) BP: (84-129)/(51-78) 128/67 (10/14 1200) SpO2:  [98 %-100 %] 100 % (10/13 2330) Weight:  [136 lb 11 oz (62 kg)-144 lb (65.3 kg)] 136 lb 11 oz (62 kg) (10/14 0400) Weight change:  Last BM Date: 07/29/16  Intake/Output from previous day: 10/13 0701 - 10/14 0700 In: 1635 [I.V.:535; IV Piggyback:1100] Out: 450 [Urine:450] No intake/output data recorded.   Physical Exam: General- pt is awake,alert, oriented to time place and person Resp- No acute REsp distress, CTA B/L NO Rhonchi CVS- S1S2 regular in  rate and rhythm GIT- BS+, soft, NT, ND EXT- NO LE Edema, Cyanosis CNS- CN 2-12 grossly intact. R Upper Ext and Lower  Ext Weakness ( chronic)  Psych- normal mood and affect    Lab Results: CBC  Recent Labs  07/30/16 1701  WBC 8.1  HGB 12.4  HCT 35.3*  PLT 162    BMET  Recent Labs  07/31/16 0441 07/31/16 1209  NA 134* 136  K 2.4* 2.5*  CL 91* 92*  CO2 32 32  GLUCOSE 136* 96  BUN 33* 26*  CREATININE 3.51* 1.79*  CALCIUM 8.5* 9.2    Trend Creat  9.2==> 1.79  Sodium 129==>136  MICRO Recent Results (from the past 240 hour(s))  MRSA PCR Screening     Status: None   Collection Time: 07/30/16 11:51 PM  Result Value Ref Range Status   MRSA by PCR NEGATIVE NEGATIVE Final    Comment:        The GeneXpert MRSA Assay (FDA approved for NASAL specimens only), is one component of a comprehensive MRSA colonization surveillance program. It is not intended to diagnose MRSA infection nor to guide or monitor treatment for MRSA infections.       Lab Results  Component Value Date   CALCIUM 9.2 07/31/2016   CAION 0.93 (L) 10/14/2015      Impression: 1)Renal  AKI secondary to Prerenal                AKI sec to Hypovolemia/ Hypotension                 AKI sec to ACE/ HCTZ/ N&V                 AKI now much better             2)CVS- hemodynamically much better   3)Anemia HGb stable  4)Psych- hx of Alc abuse      On CIWA protocol   5)Hyponatremia           Hypovolemic hyponatremia            HCTZ                         Now better    6)Hypokalemia          GI losses + HCTZ           Being replete   7)Acid base Co2 at goal     Plan:  Will suggest to increase Kcl in IVF to to help with hypokalemia Will follow bmet. NO need of further work up as Creatinine and  sodium much better.       [name] S 07/31/2016, 1:08 PM

## 2016-07-31 NOTE — Progress Notes (Signed)
Subjective: Patient was admitted due to acute renal failure and severe hypokalemia. She came with dizziness and weakness. She had diarrhea before the episode.  Objective: Vital signs in last 24 hours: Temp:  [97.4 F (36.3 C)-99.9 F (37.7 C)] 97.4 F (36.3 C) (10/14 1125) Pulse Rate:  [76-96] 90 (10/13 2330) Resp:  [14-23] 23 (10/14 1125) BP: (84-129)/(51-78) 123/60 (10/14 0730) SpO2:  [98 %-100 %] 100 % (10/13 2330) Weight:  [62 kg (136 lb 11 oz)-65.3 kg (144 lb)] 62 kg (136 lb 11 oz) (10/14 0400) Weight change:  Last BM Date: 07/29/16  Intake/Output from previous day: 10/13 0701 - 10/14 0700 In: 1635 [I.V.:535; IV Piggyback:1100] Out: 450 [Urine:450]  PHYSICAL EXAM General appearance: alert and no distress Resp: clear to auscultation bilaterally Cardio: S1, S2 normal GI: soft, non-tender; bowel sounds normal; no masses,  no organomegaly Extremities: no leg edema  Lab Results:  Results for orders placed or performed during the hospital encounter of 07/30/16 (from the past 48 hour(s))  CBC with Differential     Status: Abnormal   Collection Time: 07/30/16  5:01 PM  Result Value Ref Range   WBC 8.1 4.0 - 10.5 K/uL   RBC 3.82 (L) 3.87 - 5.11 MIL/uL   Hemoglobin 12.4 12.0 - 15.0 g/dL   HCT 35.3 (L) 36.0 - 46.0 %   MCV 92.4 78.0 - 100.0 fL   MCH 32.5 26.0 - 34.0 pg   MCHC 35.1 30.0 - 36.0 g/dL   RDW 13.2 11.5 - 15.5 %   Platelets 162 150 - 400 K/uL   Neutrophils Relative % 69 %   Neutro Abs 5.6 1.7 - 7.7 K/uL   Lymphocytes Relative 19 %   Lymphs Abs 1.6 0.7 - 4.0 K/uL   Monocytes Relative 11 %   Monocytes Absolute 0.9 0.1 - 1.0 K/uL   Eosinophils Relative 1 %   Eosinophils Absolute 0.1 0.0 - 0.7 K/uL   Basophils Relative 0 %   Basophils Absolute 0.0 0.0 - 0.1 K/uL  Comprehensive metabolic panel     Status: Abnormal   Collection Time: 07/30/16  5:01 PM  Result Value Ref Range   Sodium 129 (L) 135 - 145 mmol/L   Potassium 2.0 (LL) 3.5 - 5.1 mmol/L    Comment:  CRITICAL RESULT CALLED TO, READ BACK BY AND VERIFIED WITH: COCKRAM R. AT 1740 ON [id number] BY THOMPSON S.    Chloride 67 (L) 101 - 111 mmol/L   CO2 38 (H) 22 - 32 mmol/L   Glucose, Bld 114 (H) 65 - 99 mg/dL   BUN 40 (H) 6 - 20 mg/dL   Creatinine, Ser 9.23 (H) 0.44 - 1.00 mg/dL   Calcium 10.0 8.9 - 10.3 mg/dL   Total Protein 8.9 (H) 6.5 - 8.1 g/dL   Albumin 4.3 3.5 - 5.0 g/dL   AST 46 (H) 15 - 41 U/L   ALT 18 14 - 54 U/L   Alkaline Phosphatase 75 38 - 126 U/L   Total Bilirubin 2.3 (H) 0.3 - 1.2 mg/dL   GFR calc non Af Amer 4 (L) >60 mL/min   GFR calc Af Amer 5 (L) >60 mL/min    Comment: (NOTE) The eGFR has been calculated using the CKD EPI equation. This calculation has not been validated in all clinical situations. eGFR's persistently <60 mL/min signify possible Chronic Kidney Disease.    Anion gap 24 (H) 5 - 15  Troponin I     Status: None   Collection Time: 07/30/16  5:01 PM  Result Value Ref Range   Troponin I <0.03 <0.03 ng/mL  Lipase, blood     Status: Abnormal   Collection Time: 07/30/16  5:01 PM  Result Value Ref Range   Lipase 55 (H) 11 - 51 U/L  Magnesium     Status: None   Collection Time: 07/30/16  6:14 PM  Result Value Ref Range   Magnesium 2.0 1.7 - 2.4 mg/dL  Ethanol     Status: Abnormal   Collection Time: 07/30/16  6:21 PM  Result Value Ref Range   Alcohol, Ethyl (B) 7 (H) <5 mg/dL    Comment:        LOWEST DETECTABLE LIMIT FOR SERUM ALCOHOL IS 5 mg/dL FOR MEDICAL PURPOSES ONLY   Phenytoin level, total     Status: Abnormal   Collection Time: 07/30/16  6:21 PM  Result Value Ref Range   Phenytoin Lvl <2.5 (L) 10.0 - 20.0 ug/mL  CK     Status: None   Collection Time: 07/30/16  6:21 PM  Result Value Ref Range   Total CK 78 38 - 234 U/L  Urinalysis, Routine w reflex microscopic     Status: Abnormal   Collection Time: 07/30/16  7:30 PM  Result Value Ref Range   Color, Urine YELLOW YELLOW   APPearance HAZY (A) CLEAR   Specific Gravity, Urine 1.010 1.005  - 1.030   pH 6.0 5.0 - 8.0   Glucose, UA 100 (A) NEGATIVE mg/dL   Hgb urine dipstick MODERATE (A) NEGATIVE   Bilirubin Urine NEGATIVE NEGATIVE   Ketones, ur NEGATIVE NEGATIVE mg/dL   Protein, ur 30 (A) NEGATIVE mg/dL   Nitrite NEGATIVE NEGATIVE   Leukocytes, UA NEGATIVE NEGATIVE  Urine rapid drug screen (hosp performed)     Status: Abnormal   Collection Time: 07/30/16  7:30 PM  Result Value Ref Range   Opiates NONE DETECTED NONE DETECTED   Cocaine NONE DETECTED NONE DETECTED   Benzodiazepines NONE DETECTED NONE DETECTED   Amphetamines NONE DETECTED NONE DETECTED   Tetrahydrocannabinol NONE DETECTED NONE DETECTED   Barbiturates POSITIVE (A) NONE DETECTED    Comment:        DRUG SCREEN FOR MEDICAL PURPOSES ONLY.  IF CONFIRMATION IS NEEDED FOR ANY PURPOSE, NOTIFY LAB WITHIN 5 DAYS.        LOWEST DETECTABLE LIMITS FOR URINE DRUG SCREEN Drug Class       Cutoff (ng/mL) Amphetamine      1000 Barbiturate      200 Benzodiazepine   562 Tricyclics       563 Opiates          300 Cocaine          300 THC              50   Urine microscopic-add on     Status: Abnormal   Collection Time: 07/30/16  7:30 PM  Result Value Ref Range   Squamous Epithelial / LPF 0-5 (A) NONE SEEN   WBC, UA 0-5 0 - 5 WBC/hpf   RBC / HPF NONE SEEN 0 - 5 RBC/hpf   Bacteria, UA RARE (A) NONE SEEN  MRSA PCR Screening     Status: None   Collection Time: 07/30/16 11:51 PM  Result Value Ref Range   MRSA by PCR NEGATIVE NEGATIVE    Comment:        The GeneXpert MRSA Assay (FDA approved for NASAL specimens only), is one component of a comprehensive MRSA colonization surveillance  program. It is not intended to diagnose MRSA infection nor to guide or monitor treatment for MRSA infections.   Basic metabolic panel     Status: Abnormal   Collection Time: 07/31/16  4:41 AM  Result Value Ref Range   Sodium 134 (L) 135 - 145 mmol/L   Potassium 2.4 (LL) 3.5 - 5.1 mmol/L    Comment: CRITICAL RESULT CALLED TO,  READ BACK BY AND VERIFIED WITH: ROBERTS,T AT 6:15AM ON 07/31/16 BY FESTERMAN,C    Chloride 91 (L) 101 - 111 mmol/L   CO2 32 22 - 32 mmol/L   Glucose, Bld 136 (H) 65 - 99 mg/dL   BUN 33 (H) 6 - 20 mg/dL   Creatinine, Ser 3.51 (H) 0.44 - 1.00 mg/dL    Comment: DELTA CHECK NOTED   Calcium 8.5 (L) 8.9 - 10.3 mg/dL   GFR calc non Af Amer 13 (L) >60 mL/min   GFR calc Af Amer 15 (L) >60 mL/min    Comment: (NOTE) The eGFR has been calculated using the CKD EPI equation. This calculation has not been validated in all clinical situations. eGFR's persistently <60 mL/min signify possible Chronic Kidney Disease.    Anion gap 11 5 - 15    ABGS No results for input(s): PHART, PO2ART, TCO2, HCO3 in the last 72 hours.  Invalid input(s): PCO2 CULTURES Recent Results (from the past 240 hour(s))  MRSA PCR Screening     Status: None   Collection Time: 07/30/16 11:51 PM  Result Value Ref Range Status   MRSA by PCR NEGATIVE NEGATIVE Final    Comment:        The GeneXpert MRSA Assay (FDA approved for NASAL specimens only), is one component of a comprehensive MRSA colonization surveillance program. It is not intended to diagnose MRSA infection nor to guide or monitor treatment for MRSA infections.    Studies/Results: Dg Chest 2 View  Result Date: 07/30/2016 CLINICAL DATA:  Initial evaluation for acute dizziness, nausea, vomiting. EXAM: CHEST  2 VIEW COMPARISON:  Prior radiograph from 10/14/2015. FINDINGS: The cardiac and mediastinal silhouettes are stable in size and contour, and remain within normal limits. The lungs are normally inflated. No airspace consolidation, pleural effusion, or pulmonary edema is identified. There is no pneumothorax. No acute osseous abnormality identified. Degenerative changes noted about the shoulders bilaterally. A a es at the distal body a peak in the out at IMPRESSION: No active cardiopulmonary disease. Electronically Signed   By: Jeannine Boga M.D.   On:  07/30/2016 17:55   Ct Head Wo Contrast  Result Date: 07/30/2016 CLINICAL DATA:  Standing sensation with fall. Nausea vomiting. Fell in the bathroom on Wednesday. Hit left side of forehead on edge of bathtub. EXAM: CT HEAD WITHOUT CONTRAST TECHNIQUE: Contiguous axial images were obtained from the base of the skull through the vertex without intravenous contrast. COMPARISON:  10/10/2014. 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 Stable appearance left frontal encephalomalacia with ex vacuo dilatation frontal horn left lateral ventricle. Encephalomalacia in the inferior right frontal lobe is unchanged. Diffuse loss of parenchymal volume is consistent with atrophy. Patchy low attenuation in the deep hemispheric and periventricular white matter is nonspecific, but likely reflects chronic microvascular ischemic demyelination. Vascular: Status post clipping of right ICA aneurysm. Skull: No evidence for fracture. No worrisome lytic or sclerotic lesion. Sinuses/Orbits: The visualized paranasal sinuses and mastoid air cells are clear. Visualized portions of the globes and intraorbital fat are unremarkable. Other: None.  IMPRESSION: Stable exam.  No acute intracranial abnormality. Bifrontal chronic infarcts, left greater than right. Status post right ICA aneurysm clipping. Atrophy with chronic small vessel white matter ischemic disease. Electronically Signed   By: Misty Stanley M.D.   On: 07/30/2016 18:15   US Renal  Result Date: 07/31/2016 CLINICAL DATA:  Acute kidney injury, chronic diarrhea. EXAM: RENAL / URINARY TRACT ULTRASOUND COMPLETE COMPARISON:  Abdominal ultrasound 10/15/2015. FINDINGS: Right Kidney: Length: 12.6 cm. Echogenicity within normal limits. No mass or hydronephrosis visualized. Left Kidney: Length: 12.1 cm. Echogenicity within normal limits. No mass or hydronephrosis visualized. Bladder: Ureteral jets are visualized. IMPRESSION: Negative  exam. Electronically Signed   By: Lorin Picket M.D.   On: 07/31/2016 10:20    Medications: I have reviewed the patient's current medications.  Assesment:  Principal Problem:   Hypokalemia Active Problems:   STROKE-With also h/o Aneurysm clipping?   Chronic diarrhea   Alcohol abuse   Seizure disorder (HCC)   AKI (acute kidney injury) ([place])    Plan:  Medications reviewed Continue iv hydration Continue KCL supplement Will monitor CMP nephology consult    LOS: 1 day   Supriya Beaston 07/31/2016, 12:04 PM

## 2016-07-31 NOTE — Progress Notes (Signed)
Initial Nutrition Assessment  DOCUMENTATION CODES:  Not applicable  INTERVENTION:  Recommend continuing Nepro + MVI supplements  Monitor PO intake.   NUTRITION DIAGNOSIS:  Altered nutrition lab value related to Diarrhea and possible poor nutrition intake as evidenced by K: 2.0 on admission.  GOAL:  Patient will meet greater than or equal to 90% of their needs  MONITOR:  PO intake, Supplement acceptance, Diet advancement, Labs, Weight trends, I & O's  REASON FOR ASSESSMENT:  Malnutrition Screening Tool    ASSESSMENT:  60 y/o female PMHx seizures, chronic diarrhea, chronic abd pain, etoh abuse, HTN. Presents with constant dizziness that began 3 days ago and 1 episode of n/v. She has diarrhea on daily basis. Worked up for hypokalemia and AKI.   RD operating remotely. Per chart review, pt has chronic diarrhea as well as ETOH abuse. She typically takes K supplement, but ran out. There is no information on how she was been eating recently, but given her recent hx of etoh abuse, suspect it is very poor. Her low K could also be reflective of her poor PO intake secondary to her ETOH abuse.   Per weight review, she has lost 8 lbs in the last 3 weeks. Though, some of this is likely solely related to her dehydrated state.   She is currently eating 50-75% of her meals.   Medications: D5 + NS w/ KCL, Thiamin, folate, PPI, Nepro, MVI with minerals.   Labs: Albumin: 3.5, K was 2-> 2.5,    Recent Labs Lab 07/30/16 1701 07/30/16 1814 07/31/16 0441 07/31/16 1209  NA 129*  --  134* 136  K 2.0*  --  2.4* 2.5*  CL 67*  --  91* 92*  CO2 38*  --  32 32  BUN 40*  --  33* 26*  CREATININE 9.23*  --  3.51* 1.79*  CALCIUM 10.0  --  8.5* 9.2  MG  --  2.0  --   --   GLUCOSE 114*  --  136* 96   Diet Order:  Diet renal/carb modified with fluid restriction Diet-HS Snack? Nothing; Room service appropriate? Yes; Fluid consistency: Thin  Skin: Abrasion to head  Last BM:  10/12  Height:  Ht  Readings from Last 1 Encounters:  07/31/16 5\' 4"  (1.626 m)   Weight:  Wt Readings from Last 1 Encounters:  07/31/16 136 lb 11 oz (62 kg)   Wt Readings from Last 10 Encounters:  07/31/16 136 lb 11 oz (62 kg)  07/07/16 144 lb (65.3 kg)  04/07/16 143 lb (64.9 kg)  01/06/16 146 lb (66.2 kg)  11/25/15 145 lb 3.2 oz (65.9 kg)  10/17/15 148 lb 2.4 oz (67.2 kg)  10/20/14 133 lb 12.8 oz (60.7 kg)  10/10/14 136 lb (61.7 kg)  08/11/14 136 lb (61.7 kg)  09/05/12 142 lb 9.6 oz (64.7 kg)   Ideal Body Weight:  54.55 kg  BMI:  Body mass index is 23.46 kg/m.  Estimated Nutritional Needs:  Kcal:  1550-1750 kcals (25-28 kcals/kg bw) Protein:  75-87 g Pro (1.2-1.4 g/kg bw) Fluid:  >1.9 liters Fluid (30 ml/kg bw)  EDUCATION NEEDS:  No education needs identified at this time  Burtis Junes RD, LDN, [facility] Pager: [phone] 07/31/2016 2:23 PM

## 2016-08-01 LAB — BASIC METABOLIC PANEL
Anion gap: 8 (ref 5–15)
Anion gap: 7 (ref 5–15)
BUN: 19 mg/dL (ref 6–20)
BUN: 18 mg/dL (ref 6–20)
CO2: 31 mmol/L (ref 22–32)
CO2: 31 mmol/L (ref 22–32)
Calcium: 8.8 mg/dL — ABNORMAL LOW (ref 8.9–10.3)
Calcium: 8.6 mg/dL — ABNORMAL LOW (ref 8.9–10.3)
Chloride: 96 mmol/L — ABNORMAL LOW (ref 101–111)
Chloride: 99 mmol/L — ABNORMAL LOW (ref 101–111)
Creatinine, Ser: 1.02 mg/dL — ABNORMAL HIGH (ref 0.44–1.00)
Creatinine, Ser: 0.82 mg/dL (ref 0.44–1.00)
GFR calc Af Amer: >60 mL/min (ref >60)
GFR calc Af Amer: >60 mL/min (ref >60)
GFR calc non Af Amer: 59 mL/min — ABNORMAL LOW (ref >60)
GFR calc non Af Amer: >60 mL/min (ref >60)
Glucose, Bld: 107 mg/dL — ABNORMAL HIGH (ref 65–99)
Glucose, Bld: 116 mg/dL — ABNORMAL HIGH (ref 65–99)
Potassium: 3.5 mmol/L (ref 3.5–5.1)
Potassium: 3.9 mmol/L (ref 3.5–5.1)
Sodium: 135 mmol/L (ref 135–145)
Sodium: 137 mmol/L (ref 135–145)

## 2016-08-01 MED ORDER — ASPIRIN EC 81 MG PO TBEC
81.0000 mg | DELAYED_RELEASE_TABLET | Freq: Every day | ORAL | Status: DC
  Administered 2016-08-01 – 2016-08-02 (×2): 81 mg via ORAL
  Filled 2016-08-01 (×2): qty 1

## 2016-08-01 NOTE — Progress Notes (Signed)
Contacted dr. Anastasio Champion, who was on call for Dr.Fanta, new verbal orders given to leave out IV.

## 2016-08-01 NOTE — Progress Notes (Signed)
Patients iv access came out, 3 nurses attempted to regain access; no success. Paged patient's attending MD Dr. Legrand Rams, no response.

## 2016-08-01 NOTE — Progress Notes (Signed)
[name]  MRN: [id number]  DOB/AGE: [birth year] 60 y.o.  Primary Care Physician:FANTA,TESFAYE, MD  Admit date: 07/30/2016  Chief Complaint:  Chief Complaint  Patient presents with  . Dizziness    S-Pt presented on  07/30/2016 with  Chief Complaint  Patient presents with  . Dizziness  .    Pt today feels better. Pt offers no new complaints   Meds . darifenacin  7.5 mg Oral Daily  . feeding supplement (NEPRO CARB STEADY)  237 mL Oral BID BM  . folic acid  1 mg Oral Daily  . heparin  5,000 Units Subcutaneous Q8H  . multivitamin with minerals  1 tablet Oral Daily  . pantoprazole  40 mg Oral Daily  . phenytoin  100 mg Oral TID  . sodium chloride flush  3 mL Intravenous Q12H  . thiamine  100 mg Oral Daily   Or  . thiamine  100 mg Intravenous Daily     Physical Exam: Vital signs in last 24 hours: Temp:  [97.2 F (36.2 C)-99.2 F (37.3 C)] 98 F (36.7 C) (10/15 0400) Pulse Rate:  [73-91] 73 (10/15 0600) Resp:  [11-31] 20 (10/15 0600) BP: (81-152)/(54-92) 110/65 (10/15 0600) SpO2:  [97 %-100 %] 99 % (10/15 0600) Weight change:  Last BM Date: 07/29/16  Intake/Output from previous day: 10/14 0701 - 10/15 0700 In: 1920 [P.O.:720; I.V.:1200] Out: -  Total I/O In: 1100 [I.V.:1100] Out: -    Physical Exam: General- pt is awake,alert, oriented to time place and person Resp- No acute REsp distress, CTA B/L NO Rhonchi CVS- S1S2 regular ij rate and rhythm GIT- BS+, soft, NT, ND EXT- NO LE Edema, NO Cyanosis   Lab Results: CBC  Recent Labs  07/30/16 1701  WBC 8.1  HGB 12.4  HCT 35.3*  PLT 162    BMET  Recent Labs  08/01/16 0012 08/01/16 0452  NA 135 137  K 3.5 3.9  CL 96* 99*  CO2 31 31  GLUCOSE 107* 116*  BUN 19 18  CREATININE 1.02* 0.82  CALCIUM 8.8* 8.6*   Trend Creat  9.2==> 1.79==>0.82  Sodium 129==>136==>137   MICRO Recent Results (from the past 240 hour(s))  MRSA PCR Screening     Status: None   Collection Time: 07/30/16  11:51 PM  Result Value Ref Range Status   MRSA by PCR NEGATIVE NEGATIVE Final    Comment:        The GeneXpert MRSA Assay (FDA approved for NASAL specimens only), is one component of a comprehensive MRSA colonization surveillance program. It is not intended to diagnose MRSA infection nor to guide or monitor treatment for MRSA infections.       Lab Results  Component Value Date   CALCIUM 8.6 (L) 08/01/2016   CAION 0.93 (L) 10/14/2015          Impression: 1)Renal  AKI secondary to Prerenal                AKI sec to Hypovolemia/ Hypotension                 AKI sec to ACE/ HCTZ/ N&V                 AKI now much better                Creat back to baseline            2)CVS- hemodynamically much better   3)Anemia HGb stable  4)Psych- hx of  Alc abuse      On CIWA protocol   5)Hyponatremia           Hypovolemic hyponatremia            HCTZ                         Now much better    6)Hypokalemia          GI losses + HCTZ           Being replete          Now better  7)Acid base Co2 at goal     Plan:   Will continue current care Will sign off, thaks for allowing [place] to participate in pt care. Please call if any questions  Ramesses Crampton S 08/01/2016, 6:59 AM

## 2016-08-01 NOTE — Progress Notes (Signed)
Subjective: Patient has improved. She feels much better and her hypponatremia and hypokalemia is corrected. Her renal function has improved.  Objective: Vital signs in last 24 hours: Temp:  [97.2 F (36.2 C)-99.5 F (37.5 C)] 98.4 F (36.9 C) (10/15 1137) Pulse Rate:  [73-91] 78 (10/15 1137) Resp:  [11-31] 18 (10/15 1137) BP: (81-151)/(54-92) 132/69 (10/15 1135) SpO2:  [97 %-100 %] 100 % (10/15 1137) Weight change:  Last BM Date: 07/30/16  Intake/Output from previous day: 10/14 0701 - 10/15 0700 In: 2020 [P.O.:720; I.V.:1300] Out: -   PHYSICAL EXAM General appearance: alert and no distress Resp: clear to auscultation bilaterally Cardio: S1, S2 normal GI: soft, non-tender; bowel sounds normal; no masses,  no organomegaly Extremities: no leg edema  Lab Results:  Results for orders placed or performed during the hospital encounter of 07/30/16 (from the past 48 hour(s))  CBC with Differential     Status: Abnormal   Collection Time: 07/30/16  5:01 PM  Result Value Ref Range   WBC 8.1 4.0 - 10.5 K/uL   RBC 3.82 (L) 3.87 - 5.11 MIL/uL   Hemoglobin 12.4 12.0 - 15.0 g/dL   HCT 35.3 (L) 36.0 - 46.0 %   MCV 92.4 78.0 - 100.0 fL   MCH 32.5 26.0 - 34.0 pg   MCHC 35.1 30.0 - 36.0 g/dL   RDW 13.2 11.5 - 15.5 %   Platelets 162 150 - 400 K/uL   Neutrophils Relative % 69 %   Neutro Abs 5.6 1.7 - 7.7 K/uL   Lymphocytes Relative 19 %   Lymphs Abs 1.6 0.7 - 4.0 K/uL   Monocytes Relative 11 %   Monocytes Absolute 0.9 0.1 - 1.0 K/uL   Eosinophils Relative 1 %   Eosinophils Absolute 0.1 0.0 - 0.7 K/uL   Basophils Relative 0 %   Basophils Absolute 0.0 0.0 - 0.1 K/uL  Comprehensive metabolic panel     Status: Abnormal   Collection Time: 07/30/16  5:01 PM  Result Value Ref Range   Sodium 129 (L) 135 - 145 mmol/L   Potassium 2.0 (LL) 3.5 - 5.1 mmol/L    Comment: CRITICAL RESULT CALLED TO, READ BACK BY AND VERIFIED WITH: COCKRAM R. AT 1740 ON [id number] BY THOMPSON S.    Chloride 67 (L)  101 - 111 mmol/L   CO2 38 (H) 22 - 32 mmol/L   Glucose, Bld 114 (H) 65 - 99 mg/dL   BUN 40 (H) 6 - 20 mg/dL   Creatinine, Ser 9.23 (H) 0.44 - 1.00 mg/dL   Calcium 10.0 8.9 - 10.3 mg/dL   Total Protein 8.9 (H) 6.5 - 8.1 g/dL   Albumin 4.3 3.5 - 5.0 g/dL   AST 46 (H) 15 - 41 U/L   ALT 18 14 - 54 U/L   Alkaline Phosphatase 75 38 - 126 U/L   Total Bilirubin 2.3 (H) 0.3 - 1.2 mg/dL   GFR calc non Af Amer 4 (L) >60 mL/min   GFR calc Af Amer 5 (L) >60 mL/min    Comment: (NOTE) The eGFR has been calculated using the CKD EPI equation. This calculation has not been validated in all clinical situations. eGFR's persistently <60 mL/min signify possible Chronic Kidney Disease.    Anion gap 24 (H) 5 - 15  Troponin I     Status: None   Collection Time: 07/30/16  5:01 PM  Result Value Ref Range   Troponin I <0.03 <0.03 ng/mL  Lipase, blood     Status: Abnormal  Collection Time: 07/30/16  5:01 PM  Result Value Ref Range   Lipase 55 (H) 11 - 51 U/L  Magnesium     Status: None   Collection Time: 07/30/16  6:14 PM  Result Value Ref Range   Magnesium 2.0 1.7 - 2.4 mg/dL  Ethanol     Status: Abnormal   Collection Time: 07/30/16  6:21 PM  Result Value Ref Range   Alcohol, Ethyl (B) 7 (H) <5 mg/dL    Comment:        LOWEST DETECTABLE LIMIT FOR SERUM ALCOHOL IS 5 mg/dL FOR MEDICAL PURPOSES ONLY   Phenytoin level, total     Status: Abnormal   Collection Time: 07/30/16  6:21 PM  Result Value Ref Range   Phenytoin Lvl <2.5 (L) 10.0 - 20.0 ug/mL  CK     Status: None   Collection Time: 07/30/16  6:21 PM  Result Value Ref Range   Total CK 78 38 - 234 U/L  Urinalysis, Routine w reflex microscopic     Status: Abnormal   Collection Time: 07/30/16  7:30 PM  Result Value Ref Range   Color, Urine YELLOW YELLOW   APPearance HAZY (A) CLEAR   Specific Gravity, Urine 1.010 1.005 - 1.030   pH 6.0 5.0 - 8.0   Glucose, UA 100 (A) NEGATIVE mg/dL   Hgb urine dipstick MODERATE (A) NEGATIVE   Bilirubin  Urine NEGATIVE NEGATIVE   Ketones, ur NEGATIVE NEGATIVE mg/dL   Protein, ur 30 (A) NEGATIVE mg/dL   Nitrite NEGATIVE NEGATIVE   Leukocytes, UA NEGATIVE NEGATIVE  Urine rapid drug screen (hosp performed)     Status: Abnormal   Collection Time: 07/30/16  7:30 PM  Result Value Ref Range   Opiates NONE DETECTED NONE DETECTED   Cocaine NONE DETECTED NONE DETECTED   Benzodiazepines NONE DETECTED NONE DETECTED   Amphetamines NONE DETECTED NONE DETECTED   Tetrahydrocannabinol NONE DETECTED NONE DETECTED   Barbiturates POSITIVE (A) NONE DETECTED    Comment:        DRUG SCREEN FOR MEDICAL PURPOSES ONLY.  IF CONFIRMATION IS NEEDED FOR ANY PURPOSE, NOTIFY LAB WITHIN 5 DAYS.        LOWEST DETECTABLE LIMITS FOR URINE DRUG SCREEN Drug Class       Cutoff (ng/mL) Amphetamine      1000 Barbiturate      200 Benzodiazepine   937 Tricyclics       902 Opiates          300 Cocaine          300 THC              50   Urine microscopic-add on     Status: Abnormal   Collection Time: 07/30/16  7:30 PM  Result Value Ref Range   Squamous Epithelial / LPF 0-5 (A) NONE SEEN   WBC, UA 0-5 0 - 5 WBC/hpf   RBC / HPF NONE SEEN 0 - 5 RBC/hpf   Bacteria, UA RARE (A) NONE SEEN  MRSA PCR Screening     Status: None   Collection Time: 07/30/16 11:51 PM  Result Value Ref Range   MRSA by PCR NEGATIVE NEGATIVE    Comment:        The GeneXpert MRSA Assay (FDA approved for NASAL specimens only), is one component of a comprehensive MRSA colonization surveillance program. It is not intended to diagnose MRSA infection nor to guide or monitor treatment for MRSA infections.   Basic metabolic panel  Status: Abnormal   Collection Time: 07/31/16  4:41 AM  Result Value Ref Range   Sodium 134 (L) 135 - 145 mmol/L   Potassium 2.4 (LL) 3.5 - 5.1 mmol/L    Comment: CRITICAL RESULT CALLED TO, READ BACK BY AND VERIFIED WITH: ROBERTS,T AT 6:15AM ON 07/31/16 BY FESTERMAN,C    Chloride 91 (L) 101 - 111 mmol/L   CO2  32 22 - 32 mmol/L   Glucose, Bld 136 (H) 65 - 99 mg/dL   BUN 33 (H) 6 - 20 mg/dL   Creatinine, Ser 3.51 (H) 0.44 - 1.00 mg/dL    Comment: DELTA CHECK NOTED   Calcium 8.5 (L) 8.9 - 10.3 mg/dL   GFR calc non Af Amer 13 (L) >60 mL/min   GFR calc Af Amer 15 (L) >60 mL/min    Comment: (NOTE) The eGFR has been calculated using the CKD EPI equation. This calculation has not been validated in all clinical situations. eGFR's persistently <60 mL/min signify possible Chronic Kidney Disease.    Anion gap 11 5 - 15  Comprehensive metabolic panel     Status: Abnormal   Collection Time: 07/31/16 12:09 PM  Result Value Ref Range   Sodium 136 135 - 145 mmol/L   Potassium 2.5 (LL) 3.5 - 5.1 mmol/L    Comment: CRITICAL RESULT CALLED TO, READ BACK BY AND VERIFIED WITH: SPANGLER,E AT 12:45PM ON 07/31/16 BY FESTERMAN,C    Chloride 92 (L) 101 - 111 mmol/L   CO2 32 22 - 32 mmol/L   Glucose, Bld 96 65 - 99 mg/dL   BUN 26 (H) 6 - 20 mg/dL   Creatinine, Ser 1.79 (H) 0.44 - 1.00 mg/dL    Comment: DELTA CHECK NOTED   Calcium 9.2 8.9 - 10.3 mg/dL   Total Protein 7.6 6.5 - 8.1 g/dL   Albumin 3.4 (L) 3.5 - 5.0 g/dL   AST 43 (H) 15 - 41 U/L   ALT 19 14 - 54 U/L   Alkaline Phosphatase 69 38 - 126 U/L   Total Bilirubin 1.6 (H) 0.3 - 1.2 mg/dL   GFR calc non Af Amer 30 (L) >60 mL/min   GFR calc Af Amer 35 (L) >60 mL/min    Comment: (NOTE) The eGFR has been calculated using the CKD EPI equation. This calculation has not been validated in all clinical situations. eGFR's persistently <60 mL/min signify possible Chronic Kidney Disease.    Anion gap 12 5 - 15  Basic metabolic panel     Status: Abnormal   Collection Time: 08/01/16 12:12 AM  Result Value Ref Range   Sodium 135 135 - 145 mmol/L   Potassium 3.5 3.5 - 5.1 mmol/L    Comment: DELTA CHECK NOTED   Chloride 96 (L) 101 - 111 mmol/L   CO2 31 22 - 32 mmol/L   Glucose, Bld 107 (H) 65 - 99 mg/dL   BUN 19 6 - 20 mg/dL   Creatinine, Ser 1.02 (H) 0.44 -  1.00 mg/dL   Calcium 8.8 (L) 8.9 - 10.3 mg/dL   GFR calc non Af Amer 59 (L) >60 mL/min   GFR calc Af Amer >60 >60 mL/min    Comment: (NOTE) The eGFR has been calculated using the CKD EPI equation. This calculation has not been validated in all clinical situations. eGFR's persistently <60 mL/min signify possible Chronic Kidney Disease.    Anion gap 8 5 - 15  Basic metabolic panel     Status: Abnormal   Collection Time: 08/01/16  4:52 AM  Result Value Ref Range   Sodium 137 135 - 145 mmol/L   Potassium 3.9 3.5 - 5.1 mmol/L   Chloride 99 (L) 101 - 111 mmol/L   CO2 31 22 - 32 mmol/L   Glucose, Bld 116 (H) 65 - 99 mg/dL   BUN 18 6 - 20 mg/dL   Creatinine, Ser 0.82 0.44 - 1.00 mg/dL   Calcium 8.6 (L) 8.9 - 10.3 mg/dL   GFR calc non Af Amer >60 >60 mL/min   GFR calc Af Amer >60 >60 mL/min    Comment: (NOTE) The eGFR has been calculated using the CKD EPI equation. This calculation has not been validated in all clinical situations. eGFR's persistently <60 mL/min signify possible Chronic Kidney Disease.    Anion gap 7 5 - 15    ABGS No results for input(s): PHART, PO2ART, TCO2, HCO3 in the last 72 hours.  Invalid input(s): PCO2 CULTURES Recent Results (from the past 240 hour(s))  MRSA PCR Screening     Status: None   Collection Time: 07/30/16 11:51 PM  Result Value Ref Range Status   MRSA by PCR NEGATIVE NEGATIVE Final    Comment:        The GeneXpert MRSA Assay (FDA approved for NASAL specimens only), is one component of a comprehensive MRSA colonization surveillance program. It is not intended to diagnose MRSA infection nor to guide or monitor treatment for MRSA infections.    Studies/Results: Dg Chest 2 View  Result Date: 07/30/2016 CLINICAL DATA:  Initial evaluation for acute dizziness, nausea, vomiting. EXAM: CHEST  2 VIEW COMPARISON:  Prior radiograph from 10/14/2015. FINDINGS: The cardiac and mediastinal silhouettes are stable in size and contour, and remain  within normal limits. The lungs are normally inflated. No airspace consolidation, pleural effusion, or pulmonary edema is identified. There is no pneumothorax. No acute osseous abnormality identified. Degenerative changes noted about the shoulders bilaterally. A a es at the distal body a peak in the out at IMPRESSION: No active cardiopulmonary disease. Electronically Signed   By: Jeannine Boga M.D.   On: 07/30/2016 17:55   Ct Head Wo Contrast  Result Date: 07/30/2016 CLINICAL DATA:  Standing sensation with fall. Nausea vomiting. Fell in the bathroom on Wednesday. Hit left side of forehead on edge of bathtub. EXAM: CT HEAD WITHOUT CONTRAST TECHNIQUE: Contiguous axial images were obtained from the base of the skull through the vertex without intravenous contrast. COMPARISON:  10/10/2014. 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 Stable appearance left frontal encephalomalacia with ex vacuo dilatation frontal horn left lateral ventricle. Encephalomalacia in the inferior right frontal lobe is unchanged. Diffuse loss of parenchymal volume is consistent with atrophy. Patchy low attenuation in the deep hemispheric and periventricular white matter is nonspecific, but likely reflects chronic microvascular ischemic demyelination. Vascular: Status post clipping of right ICA aneurysm. Skull: No evidence for fracture. No worrisome lytic or sclerotic lesion. Sinuses/Orbits: The visualized paranasal sinuses and mastoid air cells are clear. Visualized portions of the globes and intraorbital fat are unremarkable. Other: None. IMPRESSION: Stable exam.  No acute intracranial abnormality. Bifrontal chronic infarcts, left greater than right. Status post right ICA aneurysm clipping. Atrophy with chronic small vessel white matter ischemic disease. Electronically Signed   By: Misty Stanley M.D.   On: 07/30/2016 18:15   US Renal  Result Date: 07/31/2016 CLINICAL  DATA:  Acute kidney injury, chronic diarrhea. EXAM: RENAL / URINARY TRACT ULTRASOUND COMPLETE COMPARISON:  Abdominal  ultrasound 10/15/2015. FINDINGS: Right Kidney: Length: 12.6 cm. Echogenicity within normal limits. No mass or hydronephrosis visualized. Left Kidney: Length: 12.1 cm. Echogenicity within normal limits. No mass or hydronephrosis visualized. Bladder: Ureteral jets are visualized. IMPRESSION: Negative exam. Electronically Signed   By: Lorin Picket M.D.   On: 07/31/2016 10:20    Medications: I have reviewed the patient's current medications.  Assesment:  Principal Problem:   Hypokalemia Active Problems:   STROKE-With also h/o Aneurysm clipping?   Chronic diarrhea   Alcohol abuse   Seizure disorder (HCC)   AKI (acute kidney injury) ([place])    Plan:  Medications reviewed Decrease iv fluid to 50cc/hr Continue KCL supplement Will monitor CMP nephology consult appreciated    LOS: 2 days   [name] 08/01/2016, 2:01 PM

## 2016-08-01 NOTE — Progress Notes (Signed)
Pt complains of constipation and bloating. Given warm prune juice. Awaiting results.

## 2016-08-02 LAB — URINE CULTURE

## 2016-08-02 LAB — BASIC METABOLIC PANEL
Anion gap: 7 (ref 5–15)
BUN: 11 mg/dL (ref 6–20)
CO2: 30 mmol/L (ref 22–32)
Calcium: 8.9 mg/dL (ref 8.9–10.3)
Chloride: 97 mmol/L — ABNORMAL LOW (ref 101–111)
Creatinine, Ser: 0.68 mg/dL (ref 0.44–1.00)
GFR calc Af Amer: >60 mL/min (ref >60)
GFR calc non Af Amer: >60 mL/min (ref >60)
Glucose, Bld: 99 mg/dL (ref 65–99)
Potassium: 3.8 mmol/L (ref 3.5–5.1)
Sodium: 134 mmol/L — ABNORMAL LOW (ref 135–145)

## 2016-08-02 MED ORDER — THIAMINE HCL 100 MG PO TABS: 100.0000 mg | ORAL_TABLET | Freq: Every day | ORAL | 3 refills | Status: DC

## 2016-08-02 MED ORDER — FOLIC ACID 1 MG PO TABS: 1.0000 mg | ORAL_TABLET | Freq: Every day | ORAL | 3 refills | Status: DC

## 2016-08-02 MED ORDER — ASPIRIN 81 MG PO TBEC: 81.0000 mg | DELAYED_RELEASE_TABLET | Freq: Every day | ORAL | 3 refills | Status: DC

## 2016-08-02 NOTE — Discharge Summary (Signed)
Physician Discharge Summary  Patient ID: [name] MRN: [id number] DOB/AGE: 01/10/1956 60 y.o. Primary Care Physician:Conner Neiss, MD Admit date: 07/30/2016 Discharge date: 08/02/2016    Discharge Diagnoses:   Principal Problem:   Hypokalemia Active Problems:   STROKE-With also h/o Aneurysm clipping?   Chronic diarrhea   Alcohol abuse   Seizure disorder (HCC)   AKI (acute kidney injury) (HCC)     Medication List    STOP taking these medications   amLODipine 5 MG tablet Commonly known as:  NORVASC     TAKE these medications   albuterol 108 (90 Base) MCG/ACT inhaler Commonly known as:  PROVENTIL HFA;VENTOLIN HFA Inhale 2 puffs into the lungs every 6 (six) hours as needed. FOR SHORTNESS OF BREATH AND/OR WHEEZING   aspirin 81 MG EC tablet Take 1 tablet (81 mg total) by mouth daily.   folic acid 1 MG tablet Commonly known as:  FOLVITE Take 1 tablet (1 mg total) by mouth daily.   HYDROcodone-acetaminophen 5-325 MG tablet Commonly known as:  NORCO/VICODIN Take 1 tablet by mouth every 6 (six) hours as needed for moderate pain (Must last 14 days.Do not take and drive a car or use machinery.).   ipratropium-albuterol 0.5-2.5 (3) MG/3ML Soln Commonly known as:  DUONEB Take 3 mLs by nebulization every 6 (six) hours as needed (for shortness of breath/wheezing).   lisinopril-hydrochlorothiazide 20-12.5 MG tablet Commonly known as:  PRINZIDE,ZESTORETIC Take 1 tablet by mouth daily.   omeprazole 20 MG capsule Commonly known as:  PRILOSEC Take 1 capsule (20 mg total) by mouth 2 (two) times daily before a meal.   phenytoin 100 MG ER capsule Commonly known as:  DILANTIN Take 100 mg by mouth 3 (three) times daily.   solifenacin 5 MG tablet Commonly known as:  VESICARE Take 5 mg by mouth daily.   thiamine 100 MG tablet Take 1 tablet (100 mg total) by mouth daily.       Discharged Condition: improved    Consults: none  Significant Diagnostic  Studies: Dg Chest 2 View  Result Date: 07/30/2016 CLINICAL DATA:  Initial evaluation for acute dizziness, nausea, vomiting. EXAM: CHEST  2 VIEW COMPARISON:  Prior radiograph from 10/14/2015. FINDINGS: The cardiac and mediastinal silhouettes are stable in size and contour, and remain within normal limits. The lungs are normally inflated. No airspace consolidation, pleural effusion, or pulmonary edema is identified. There is no pneumothorax. No acute osseous abnormality identified. Degenerative changes noted about the shoulders bilaterally. A a es at the distal body a peak in the out at IMPRESSION: No active cardiopulmonary disease. Electronically Signed   By: Rise Mu M.D.   On: 07/30/2016 17:55   Ct Head Wo Contrast  Result Date: 07/30/2016 CLINICAL DATA:  Standing sensation with fall. Nausea vomiting. Fell in the bathroom on Wednesday. Hit left side of forehead on edge of bathtub. EXAM: CT HEAD WITHOUT CONTRAST TECHNIQUE: Contiguous axial images were obtained from the base of the skull through the vertex without intravenous contrast. COMPARISON:  10/10/2014. 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 Stable appearance left frontal encephalomalacia with ex vacuo dilatation frontal horn left lateral ventricle. Encephalomalacia in the inferior right frontal lobe is unchanged. Diffuse loss of parenchymal volume is consistent with atrophy. Patchy low attenuation in the deep hemispheric and periventricular white matter is nonspecific, but likely reflects chronic microvascular ischemic demyelination. Vascular: Status post clipping of right ICA aneurysm. Skull: No evidence for fracture. No worrisome lytic or  sclerotic lesion. Sinuses/Orbits: The visualized paranasal sinuses and mastoid air cells are clear. Visualized portions of the globes and intraorbital fat are unremarkable. Other: None. IMPRESSION: Stable exam.  No acute intracranial  abnormality. Bifrontal chronic infarcts, left greater than right. Status post right ICA aneurysm clipping. Atrophy with chronic small vessel white matter ischemic disease. Electronically Signed   By: Kennith Center M.D.   On: 07/30/2016 18:15   US Renal  Result Date: 07/31/2016 CLINICAL DATA:  Acute kidney injury, chronic diarrhea. EXAM: RENAL / URINARY TRACT ULTRASOUND COMPLETE COMPARISON:  Abdominal ultrasound 10/15/2015. FINDINGS: Right Kidney: Length: 12.6 cm. Echogenicity within normal limits. No mass or hydronephrosis visualized. Left Kidney: Length: 12.1 cm. Echogenicity within normal limits. No mass or hydronephrosis visualized. Bladder: Ureteral jets are visualized. IMPRESSION: Negative exam. Electronically Signed   By: Leanna Battles M.D.   On: 07/31/2016 10:20    Lab Results: Basic Metabolic Panel:  Recent Labs  21/30/86 1814  08/01/16 0452 08/02/16 0657  NA  --   < > 137 134*  K  --   < > 3.9 3.8  CL  --   < > 99* 97*  CO2  --   < > 31 30  GLUCOSE  --   < > 116* 99  BUN  --   < > 18 11  CREATININE  --   < > 0.82 0.68  CALCIUM  --   < > 8.6* 8.9  MG 2.0  --   --   --   < > = values in this interval not displayed. Liver Function Tests:  Recent Labs  07/30/16 1701 07/31/16 1209  AST 46* 43*  ALT 18 19  ALKPHOS 75 69  BILITOT 2.3* 1.6*  PROT 8.9* 7.6  ALBUMIN 4.3 3.4*     CBC:  Recent Labs  07/30/16 1701  WBC 8.1  NEUTROABS 5.6  HGB 12.4  HCT 35.3*  MCV 92.4  PLT 162    Recent Results (from the past 240 hour(s))  MRSA PCR Screening     Status: None   Collection Time: 07/30/16 11:51 PM  Result Value Ref Range Status   MRSA by PCR NEGATIVE NEGATIVE Final    Comment:        The GeneXpert MRSA Assay (FDA approved for NASAL specimens only), is one component of a comprehensive MRSA colonization surveillance program. It is not intended to diagnose MRSA infection nor to guide or monitor treatment for MRSA infections.      Hospital Course:    This is a 60 years old female with history of multiple medical illnesses was admitted due to hypotension, acute renal failure, hypokalemia and hyponatremia. Patient was hydrated and KCL was supplemented. Her renal function, electrolyte improved. Her antihypertensive is on hold. Patient will be re-evaluated in the office in one week and medications will be restarted if indicated. Patient is advised to stop alcohol abuse.  Discharge Exam: Blood pressure 120/68, pulse 68, temperature 98.9 F (37.2 C), temperature source Oral, resp. rate 18, height 5\' 4"  (1.626 m), weight 62 kg (136 lb 11 oz), SpO2 98 %.   Disposition:  home    Follow-up Information    Promise Weldin, MD Follow up in 1 week(s).   Specialty:  Internal Medicine Contact information: [address] [place] [address] [phone]           Signed: Avon Gully   08/02/2016, 8:18 AM

## 2016-08-02 NOTE — Plan of Care (Signed)
Problem: Bowel/Gastric: Goal: Will not experience complications related to bowel motility Outcome: Progressing Pt c/o of constipation, given warm prune juice. One hour after prune juice pt had bowel movements and states she feels better. Will continue to monitor.

## 2016-08-02 NOTE — Progress Notes (Signed)
Pt given discharge instructions and all questions answered. Patient will be discharged with boyfriend to home via private vehicle.

## 2016-08-02 NOTE — Clinical Social Work Note (Signed)
CSW received consult to resume home health upon d/c. CM notified. Will sign off, but can be reconsulted if needed.  Benay Pike, [place]

## 2016-08-02 NOTE — Care Management Note (Signed)
Case Management Note  Patient Details  Name: [name] MRN: [id number] Date of Birth: 12/27/1955  Subjective/Objective:    Pt is from home, lives with her boyfriend and requires assistance with ADL's at baseline. Pt ambulates independently and has a ramp at home, hemi walker, and BSC.  Pt has neb machine at home. Pt has aid that comes for a few hours a day three days a week. Pt plans to return home with self care at DC.              Action/Plan: Consulted for possible HH needs. Patient declines any HH services. Patient to discharge home today.   Expected Discharge Date:    08/02/2016 //Expected Discharge Plan:     In-House Referral:     Discharge planning Services     Post Acute Care Choice:    Choice offered to:     DME Arranged:    DME Agency:     HH Arranged:    HH Agency:     Status of Service:     If discussed at [employer], dates discussed:    Additional Comments:  Halena Mohar, Chauncey Reading, RN 08/02/2016, 9:17 AM

## 2016-09-15 ENCOUNTER — Other Ambulatory Visit (HOSPITAL_COMMUNITY): Payer: Self-pay | Admitting: Internal Medicine

## 2016-09-15 DIAGNOSIS — Z1231 Encounter for screening mammogram for malignant neoplasm of breast: Secondary | ICD-10-CM

## 2016-10-06 ENCOUNTER — Ambulatory Visit: Payer: Medicare Other | Admitting: Orthopaedic Surgery

## 2016-10-06 ENCOUNTER — Encounter: Payer: Self-pay | Admitting: Orthopaedic Surgery

## 2016-10-19 ENCOUNTER — Ambulatory Visit (INDEPENDENT_AMBULATORY_CARE_PROVIDER_SITE_OTHER): Payer: Medicare Other | Admitting: Orthopaedic Surgery

## 2016-10-19 VITALS — BP 163/89 | HR 103 | Temp 97.7°F | Ht 64.0 in | Wt 145.0 lb

## 2016-10-19 DIAGNOSIS — M25511 Pain in right shoulder: Secondary | ICD-10-CM

## 2016-10-19 DIAGNOSIS — M25561 Pain in right knee: Secondary | ICD-10-CM | POA: Diagnosis not present

## 2016-10-19 DIAGNOSIS — G8929 Other chronic pain: Secondary | ICD-10-CM | POA: Diagnosis not present

## 2016-10-19 MED ORDER — HYDROCODONE-ACETAMINOPHEN 5-325 MG PO TABS: 1.0000 | ORAL_TABLET | Freq: Four times a day (QID) | ORAL | 0 refills | Status: DC | PRN

## 2016-10-19 NOTE — Progress Notes (Signed)
Patient [name], female DOB:Mar 04, 1956, 61 y.o. [id number]  Chief Complaint  Patient presents with  . Follow-up    shoulder and knee pain    HPI  [name] is a 61 y.o. female who has chronic right shoulder pain and right knee pain.  Her knee is stable. She has swelling and popping but no giving way.  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  She has marked weakness residual of the right upper extremity. HPI  Body mass index is 24.89 kg/m.  ROS  Review of Systems  Constitutional:       Patient does not have Diabetes Mellitus. Patient has hypertension. Patient has COPD or shortness of breath. Patient does not have BMI > 35. Patient does not have current smoking history.  Respiratory: Negative for shortness of breath.   Cardiovascular: Negative for chest pain.  Endocrine: Positive for cold intolerance.  Musculoskeletal: Positive for arthralgias, gait problem, myalgias and neck pain.  Allergic/Immunologic: Positive for environmental allergies.  Neurological: Positive for weakness (right side) and headaches.    Past Medical History:  Diagnosis Date  . Alcohol abuse   . Chronic abdominal pain   . Chronic diarrhea   . Contracture of muscle of hand    right  . HTN (hypertension)   . PUD (peptic ulcer disease)    remote  . Seizures ([place])    since stroke, no recent seizures  . Shortness of breath    with exertion  . Stroke [facility])    age 37, required brain surgery    Past Surgical History:  Procedure Laterality Date  . ABDOMINAL HYSTERECTOMY  2004   complete  . BRAIN SURGERY     age 6, stroke  . COLONOSCOPY WITH PROPOFOL  10/03/2012   Dr. [name]: moderate diverticulosis, small internal hemorrhoids, TUBULAR ADENOMA. Next colonoscopy 2023 per Dr. [name].  . ESOPHAGOGASTRODUODENOSCOPY N/A 10/18/2014   Dr. Gala Romney during inpatient hospitalization: severe exudative/erosive reflux esophagitis as source of trivial upper GI bleed. No varices    . ESOPHAGOGASTRODUODENOSCOPY (EGD) WITH PROPOFOL  10/03/2012   Dr. [name]: stricture at [place] junction s/p dilation, mild gastritis negative H.pylori   . right tib-fib fracture  2008  . SAVORY DILATION  10/03/2012   Procedure: SAVORY DILATION;  Surgeon: Danie Binder, MD;  Location: [facility];  Service: Endoscopy;  Laterality: N/A;  started at 1303,  dilated 12.8-16mm    Family History  Problem Relation Age of Onset  . Liver disease Sister     etoh  . Colon cancer Neg Hx     Social History Social History  Substance Use Topics  . Smoking status: Former Smoker    Packs/day: 0.04    Years: 9.00    Types: Cigarettes    Quit date: 07/28/2014  . Smokeless tobacco: Never Used  . Alcohol use Yes     Comment: Pt last drink on Monday    Allergies  Allergen Reactions  . Other     Unable to recall med    Current Outpatient Prescriptions  Medication Sig Dispense Refill  . albuterol (PROVENTIL HFA;VENTOLIN HFA) 108 (90 BASE) MCG/ACT inhaler Inhale 2 puffs into the lungs every 6 (six) hours as needed. FOR SHORTNESS OF BREATH AND/OR WHEEZING    . aspirin EC 81 MG EC tablet Take 1 tablet (81 mg total) by mouth daily. 30 tablet 3  . folic acid (FOLVITE) 1 MG tablet Take 1 tablet (1 mg total) by mouth daily. 30 tablet 3  .  HYDROcodone-acetaminophen (NORCO/VICODIN) 5-325 MG tablet Take 1 tablet by mouth every 6 (six) hours as needed for moderate pain (Must last 14 days.Do not take and drive a car or use machinery.). 52 tablet 0  . ipratropium-albuterol (DUONEB) 0.5-2.5 (3) MG/3ML SOLN Take 3 mLs by nebulization every 6 (six) hours as needed (for shortness of breath/wheezing).     Marland Kitchen lisinopril-hydrochlorothiazide (PRINZIDE,ZESTORETIC) 20-12.5 MG per tablet Take 1 tablet by mouth daily.    Marland Kitchen omeprazole (PRILOSEC) 20 MG capsule Take 1 capsule (20 mg total) by mouth 2 (two) times daily before a meal. 60 capsule 5  . phenytoin (DILANTIN) 100 MG ER capsule Take 100 mg by mouth 3 (three) times daily.     . solifenacin (VESICARE) 5 MG tablet Take 5 mg by mouth daily.    Marland Kitchen thiamine 100 MG tablet Take 1 tablet (100 mg total) by mouth daily. 30 tablet 3   No current facility-administered medications for this visit.      Physical Exam  Blood pressure (!) 163/89, pulse (!) 103, temperature 97.7 F (36.5 C), height 5\' 4"  (1.626 m), weight 145 lb (65.8 kg).  Constitutional: overall normal hygiene, normal nutrition, well developed, normal grooming, normal body habitus. Assistive device:none  Musculoskeletal: gait and station Limp right, muscle tone and strength are normal, no tremors or atrophy is present.  .  Neurological: she has old right CVA with upper extremity markedly affected with weakness of 2 of 5 and deformity of hand.  Sensation normal.  Cranial nerves II-XII intact.   Skin:   Normal overall no scars, lesions, ulcers or rashes. No psoriasis.  Psychiatric: Alert and oriented x 3.  Recent memory intact, remote memory unclear.  Normal mood and affect. Well groomed.  Good eye contact.  Cardiovascular: overall no swelling, no varicosities, no edema bilaterally, normal temperatures of the legs and arms, no clubbing, cyanosis and good capillary refill.  Lymphatic: palpation is normal.  The right lower extremity is examined:  Inspection:  Thigh:  Non-tender and no defects  Knee has swelling 1+ effusion.                        Joint tenderness is present                        Patient is tender over the medial joint line  Lower Leg:  Has normal appearance and no tenderness or defects  Ankle:  Non-tender and no defects  Foot:  Non-tender and no defects Range of Motion:  Knee:  Range of motion is: 0-100                        Crepitus is  present  Ankle:  Range of motion is normal. Strength and Tone:  The right lower extremity has normal strength and tone. Stability:  Knee:  The knee is stable.  Ankle:  The ankle is stable.    The patient has been educated about the nature  of the problem(s) and counseled on treatment options.  The patient appeared to understand what I have discussed and is in agreement with it.  Encounter Diagnoses  Name Primary?  . Chronic right shoulder pain Yes  . Chronic pain of right knee     PLAN Call if any problems.  Precautions discussed.  Continue current medications.   Return to clinic 3 months   Electronically Signed Sanjuana Kava, MD 1/2/20189:51 AM

## 2016-10-20 ENCOUNTER — Ambulatory Visit (HOSPITAL_COMMUNITY)
Admission: RE | Admit: 2016-10-20 | Discharge: 2016-10-20 | Disposition: A | Discharge status: Home / Self Care | Payer: Medicare Other | Source: Ambulatory Visit | Attending: Internal Medicine | Admitting: Internal Medicine

## 2016-10-20 DIAGNOSIS — Z1231 Encounter for screening mammogram for malignant neoplasm of breast: Secondary | ICD-10-CM | POA: Insufficient documentation

## 2016-11-25 ENCOUNTER — Telehealth: Payer: Self-pay | Admitting: Orthopaedic Surgery

## 2016-11-25 MED ORDER — HYDROCODONE-ACETAMINOPHEN 5-325 MG PO TABS
1.0000 | ORAL_TABLET | Freq: Four times a day (QID) | ORAL | 0 refills | Status: DC | PRN
Start: 2016-11-25 — End: 2016-12-12

## 2016-11-25 NOTE — Telephone Encounter (Signed)
Hydrocodone-Acetaminophen 5/325mg   Qty 52 Tablets

## 2016-11-25 NOTE — Telephone Encounter (Signed)
Hydrocodone-Acetaminophen  5/325mg  Qty 52 Tablets

## 2016-11-25 NOTE — Telephone Encounter (Signed)
Denied.  I saw her this morning and explained the situation.  She has gotten medicine from her family doctor very recently.  She needs to get her narcotics from her family doctor.  This is documented in the note done today.

## 2016-12-12 ENCOUNTER — Emergency Department (HOSPITAL_COMMUNITY): Payer: Medicare Other

## 2016-12-12 ENCOUNTER — Encounter (HOSPITAL_COMMUNITY): Payer: Self-pay

## 2016-12-12 ENCOUNTER — Emergency Department (HOSPITAL_COMMUNITY)
Admission: EM | Admit: 2016-12-12 | Discharge: 2016-12-12 | Disposition: A | Discharge status: Home / Self Care | Payer: Medicare Other | Attending: Emergency Medicine | Admitting: Emergency Medicine

## 2016-12-12 DIAGNOSIS — Z79899 Other long term (current) drug therapy: Secondary | ICD-10-CM | POA: Diagnosis not present

## 2016-12-12 DIAGNOSIS — Z87891 Personal history of nicotine dependence: Secondary | ICD-10-CM | POA: Diagnosis not present

## 2016-12-12 DIAGNOSIS — R109 Unspecified abdominal pain: Secondary | ICD-10-CM | POA: Insufficient documentation

## 2016-12-12 DIAGNOSIS — I10 Essential (primary) hypertension: Secondary | ICD-10-CM | POA: Diagnosis not present

## 2016-12-12 DIAGNOSIS — Z7982 Long term (current) use of aspirin: Secondary | ICD-10-CM | POA: Diagnosis not present

## 2016-12-12 LAB — COMPREHENSIVE METABOLIC PANEL
ALT: 26 U/L (ref 14–54)
AST: 68 U/L — ABNORMAL HIGH (ref 15–41)
Albumin: 3.8 g/dL (ref 3.5–5.0)
Alkaline Phosphatase: 109 U/L (ref 38–126)
Anion gap: 10 (ref 5–15)
BUN: <5 mg/dL — ABNORMAL LOW (ref 6–20)
CO2: 25 mmol/L (ref 22–32)
Calcium: 8.9 mg/dL (ref 8.9–10.3)
Chloride: 95 mmol/L — ABNORMAL LOW (ref 101–111)
Creatinine, Ser: 0.55 mg/dL (ref 0.44–1.00)
GFR calc Af Amer: >60 mL/min (ref >60)
GFR calc non Af Amer: >60 mL/min (ref >60)
Glucose, Bld: 101 mg/dL — ABNORMAL HIGH (ref 65–99)
Potassium: 3.1 mmol/L — ABNORMAL LOW (ref 3.5–5.1)
Sodium: 130 mmol/L — ABNORMAL LOW (ref 135–145)
Total Bilirubin: 0.7 mg/dL (ref 0.3–1.2)
Total Protein: 7.8 g/dL (ref 6.5–8.1)

## 2016-12-12 LAB — RAPID URINE DRUG SCREEN, HOSP PERFORMED
Amphetamines: NOT DETECTED
Barbiturates: POSITIVE — AB
Benzodiazepines: NOT DETECTED
Cocaine: NOT DETECTED
Opiates: NOT DETECTED
Tetrahydrocannabinol: NOT DETECTED

## 2016-12-12 LAB — CBC WITH DIFFERENTIAL/PLATELET
Basophils Absolute: 0 10*3/uL (ref 0.0–0.1)
Basophils Relative: 0 %
Eosinophils Absolute: 0.1 10*3/uL (ref 0.0–0.7)
Eosinophils Relative: 1 %
HCT: 33.3 % — ABNORMAL LOW (ref 36.0–46.0)
Hemoglobin: 11.6 g/dL — ABNORMAL LOW (ref 12.0–15.0)
Lymphocytes Relative: 42 %
Lymphs Abs: 2.4 10*3/uL (ref 0.7–4.0)
MCH: 31.5 pg (ref 26.0–34.0)
MCHC: 34.8 g/dL (ref 30.0–36.0)
MCV: 90.5 fL (ref 78.0–100.0)
Monocytes Absolute: 0.4 10*3/uL (ref 0.1–1.0)
Monocytes Relative: 8 %
Neutro Abs: 2.7 10*3/uL (ref 1.7–7.7)
Neutrophils Relative %: 49 %
Platelets: 124 10*3/uL — ABNORMAL LOW (ref 150–400)
RBC: 3.68 MIL/uL — ABNORMAL LOW (ref 3.87–5.11)
RDW: 13 % (ref 11.5–15.5)
WBC: 5.6 10*3/uL (ref 4.0–10.5)

## 2016-12-12 LAB — LIPASE, BLOOD: Lipase: 20 U/L (ref 11–51)

## 2016-12-12 LAB — URINALYSIS, ROUTINE W REFLEX MICROSCOPIC
Bilirubin Urine: NEGATIVE
Glucose, UA: NEGATIVE mg/dL
Hgb urine dipstick: NEGATIVE
Ketones, ur: NEGATIVE mg/dL
Leukocytes, UA: NEGATIVE
Nitrite: NEGATIVE
Protein, ur: NEGATIVE mg/dL
Specific Gravity, Urine: 1.003 — ABNORMAL LOW (ref 1.005–1.030)
pH: 6 (ref 5.0–8.0)

## 2016-12-12 LAB — TROPONIN I: Troponin I: <0.03 ng/mL (ref <0.03)

## 2016-12-12 NOTE — ED Provider Notes (Signed)
[facility] DEPT Provider Note   CSN: [id number] Arrival date & time: 12/12/16  1909     History   Chief Complaint Chief Complaint  Patient presents with  . Flank Pain  . Abdominal Pain    HPI [name] is a 61 y.o. female.  The history is provided by the patient and a friend.  Flank Pain  This is a new problem. The current episode started more than 2 days ago. The problem occurs daily. The problem has not changed since onset.Associated symptoms include abdominal pain. Pertinent negatives include no chest pain, no headaches and no shortness of breath. Nothing aggravates the symptoms. Nothing relieves the symptoms. She has tried nothing for the symptoms.  Abdominal Pain   Pertinent negatives include headaches.    Past Medical History:  Diagnosis Date  . Alcohol abuse   . Chronic abdominal pain   . Chronic diarrhea   . Contracture of muscle of hand    right  . HTN (hypertension)   . PUD (peptic ulcer disease)    remote  . Seizures ([place])    since stroke, no recent seizures  . Shortness of breath    with exertion  . Stroke [facility])    age 43, required brain surgery    Patient Active Problem List   Diagnosis Date Noted  . AKI (acute kidney injury) ([place]) 07/30/2016  . Anemia 11/25/2015  . GIB (gastrointestinal bleeding) 10/16/2015  . Abnormal LFTs   . Hematemesis with nausea   . ETOH abuse   . Thrombocytopenia ([place])   . GI bleed 10/14/2015  . Upper GI bleed 10/18/2014  . Hematemesis 10/18/2014  . Reflux esophagitis   . Alcohol intoxication ([place]) 10/10/2014  . Sinus tachycardia ([place]) 10/10/2014  . Hypokalemia 10/10/2014  . Seizure disorder ([place]) 10/10/2014  . History of rectal bleeding 09/05/2012  . Chronic diarrhea 09/05/2012  . Alcohol abuse 09/05/2012  . Vomiting 09/05/2012  . Esophageal dysphagia 09/05/2012  . GERD (gastroesophageal reflux disease) 09/05/2012  . Upper abdominal pain 09/05/2012  . FX CLOSED TIBIA NOS 07/27/2007  . STROKE-With  also h/o Aneurysm clipping? 07/25/2007  . SEIZURES 07/25/2007  . History of cardiovascular disorder 07/25/2007    Past Surgical History:  Procedure Laterality Date  . ABDOMINAL HYSTERECTOMY  2004   complete  . BRAIN SURGERY     age 51, stroke  . COLONOSCOPY    . COLONOSCOPY WITH PROPOFOL  10/03/2012   Dr. [name]: moderate diverticulosis, small internal hemorrhoids, TUBULAR ADENOMA. Next colonoscopy 2023 per Dr. [name].  . ESOPHAGOGASTRODUODENOSCOPY N/A 10/18/2014   Dr. Gala Romney during inpatient hospitalization: severe exudative/erosive reflux esophagitis as source of trivial upper GI bleed. No varices   . ESOPHAGOGASTRODUODENOSCOPY (EGD) WITH PROPOFOL  10/03/2012   Dr. [name]: stricture at [place] junction s/p dilation, mild gastritis negative H.pylori   . right tib-fib fracture  2008  . SAVORY DILATION  10/03/2012   Procedure: SAVORY DILATION;  Surgeon: Danie Binder, MD;  Location: [facility]S;  Service: Endoscopy;  Laterality: N/A;  started at 1303,  dilated 12.8-16mm    OB History    Gravida Para Term Preterm AB Living   4 3 3   1 3    SAB TAB Ectopic Multiple Live Births   1               Home Medications    Prior to Admission medications   Medication Sig Start Date End Date Taking? Authorizing Provider  albuterol (PROVENTIL HFA;VENTOLIN HFA) 108 (90 BASE)  MCG/ACT inhaler Inhale 2 puffs into the lungs every 6 (six) hours as needed for wheezing or shortness of breath.    Yes Historical Provider, MD  aspirin EC 81 MG EC tablet Take 1 tablet (81 mg total) by mouth daily. 08/02/16  Yes [name], MD  folic acid (FOLVITE) 1 MG tablet Take 1 tablet (1 mg total) by mouth daily. 08/02/16  Yes [name], MD  HYDROcodone-acetaminophen (NORCO/VICODIN) 5-325 MG tablet Take 1 tablet by mouth every 6 (six) hours as needed for moderate pain.   Yes Historical Provider, MD  ipratropium-albuterol (DUONEB) 0.5-2.5 (3) MG/3ML SOLN Take 3 mLs by nebulization every 6 (six) hours as needed (for  shortness of breath/wheezing).    Yes Historical Provider, MD  lisinopril-hydrochlorothiazide (PRINZIDE,ZESTORETIC) 20-12.5 MG per tablet Take 1 tablet by mouth daily.   Yes Historical Provider, MD  omeprazole (PRILOSEC) 20 MG capsule Take 1 capsule (20 mg total) by mouth 2 (two) times daily before a meal. Patient taking differently: Take 20 mg by mouth daily.  11/25/15  Yes [name], PA-C  PHENObarbital (LUMINAL) 30 MG tablet Take 30 mg by mouth 2 (two) times daily.   Yes Historical Provider, MD  phenytoin (DILANTIN) 100 MG ER capsule Take 100 mg by mouth 3 (three) times daily.   Yes Historical Provider, MD  solifenacin (VESICARE) 5 MG tablet Take 5 mg by mouth daily.   Yes Historical Provider, MD  thiamine 100 MG tablet Take 1 tablet (100 mg total) by mouth daily. 08/02/16  Yes [name], MD    Family History Family History  Problem Relation Age of Onset  . Liver disease Sister     etoh  . Colon cancer Neg Hx     Social History Social History  Substance Use Topics  . Smoking status: Former Smoker    Packs/day: 0.04    Years: 9.00    Types: Cigarettes    Quit date: 07/28/2014  . Smokeless tobacco: Never Used  . Alcohol use Yes     Comment: Pt last drink on Monday     Allergies   Patient has no known allergies.   Review of Systems Review of Systems  Respiratory: Negative for shortness of breath.   Cardiovascular: Negative for chest pain.  Gastrointestinal: Positive for abdominal pain.  Genitourinary: Positive for flank pain.  Neurological: Negative for headaches.  All other systems reviewed and are negative.    Physical Exam Updated Vital Signs BP 106/68   Pulse 88   Temp 97.6 F (36.4 C) (Oral)   Resp 19   Ht 5\' 4"  (1.626 m)   Wt 145 lb (65.8 kg)   SpO2 98%   BMI 24.89 kg/m   Physical Exam  Constitutional: She is oriented to person, place, and time. She appears well-developed and well-nourished.  HENT:  Head: Normocephalic and atraumatic.    Eyes: Conjunctivae and EOM are normal.  Neck: Normal range of motion.  Cardiovascular: Normal rate and regular rhythm.   Pulmonary/Chest: Effort normal. No stridor. No respiratory distress.  Abdominal: Soft. She exhibits distension. There is no tenderness.  Musculoskeletal: Normal range of motion. She exhibits no edema, tenderness or deformity.  Neurological: She is alert and oriented to person, place, and time. No cranial nerve deficit.  Skin: Skin is warm and dry.  Nursing note and vitals reviewed.    ED Treatments / Results  Labs (all labs ordered are listed, but only abnormal results are displayed) Labs Reviewed  CBC WITH DIFFERENTIAL/PLATELET - Abnormal;  Notable for the following:       Result Value   RBC 3.68 (*)    Hemoglobin 11.6 (*)    HCT 33.3 (*)    Platelets 124 (*)    All other components within normal limits  COMPREHENSIVE METABOLIC PANEL - Abnormal; Notable for the following:    Sodium 130 (*)    Potassium 3.1 (*)    Chloride 95 (*)    Glucose, Bld 101 (*)    BUN <5 (*)    AST 68 (*)    All other components within normal limits  URINALYSIS, ROUTINE W REFLEX MICROSCOPIC - Abnormal; Notable for the following:    Color, Urine STRAW (*)    Specific Gravity, Urine 1.003 (*)    All other components within normal limits  RAPID URINE DRUG SCREEN, HOSP PERFORMED - Abnormal; Notable for the following:    Barbiturates POSITIVE (*)    All other components within normal limits  LIPASE, BLOOD  TROPONIN I    EKG  EKG Interpretation  Date/Time:  Sunday December 12 2016 19:22:11 EST Ventricular Rate:  82 PR Interval:    QRS Duration: 103 QT Interval:  399 QTC Calculation: 466 R Axis:   42 Text Interpretation:  Sinus rhythm Borderline prolonged PR interval Probable lateral infarct, old No significant change since last tracing Confirmed by [facility] MD, Maizie Garno [id number]) on 12/12/2016 7:36:17 PM       Radiology Ct Renal Stone Study  Result Date: 12/12/2016 CLINICAL  DATA:  Acute onset of left flank and abdominal pain. Seizure. Initial encounter. EXAM: CT ABDOMEN AND PELVIS WITHOUT CONTRAST TECHNIQUE: Multidetector CT imaging of the abdomen and pelvis was performed following the standard protocol without IV contrast. COMPARISON:  Abdominal ultrasound performed 10/15/2015, and renal ultrasound performed 07/31/2016 FINDINGS: Lower chest: Mild bibasilar atelectasis is noted, somewhat nodular at the left lung base. The visualized portions of the mediastinum are unremarkable. Hepatobiliary: The nodular contour of the liver is compatible with hepatic cirrhosis. No dominant hepatic mass is seen. The gallbladder is grossly unremarkable in appearance. The common bile duct remains normal in caliber. Pancreas: The pancreas is within normal limits. Spleen: The spleen is unremarkable in appearance. Adrenals/Urinary Tract: The adrenal glands are unremarkable in appearance. The kidneys are within normal limits. Mild bilateral renal pelvicaliectasis remains within normal limits. There is no evidence of hydronephrosis. No renal or ureteral stones are identified. No perinephric stranding is seen. Stomach/Bowel: The stomach is unremarkable in appearance. The small bowel is within normal limits. The appendix is normal in caliber, without evidence of appendicitis. Scattered diverticulosis is noted along the descending and proximal sigmoid colon, without evidence of diverticulitis. Vascular/Lymphatic: Minimal calcification is seen at the distal abdominal aorta. The visualized vasculature is otherwise unremarkable, though not well assessed without contrast. No retroperitoneal or pelvic sidewall lymphadenopathy is seen. Reproductive: The bladder is mildly distended and grossly unremarkable. The patient is status post hysterectomy. No suspicious adnexal masses are seen. Other: No additional soft tissue abnormalities are seen. Musculoskeletal: No acute osseous abnormalities are identified. Prominent  osteophyte formation is noted about the left hip joint. Facet disease is noted along the lower lumbar spine. The visualized musculature is unremarkable in appearance. IMPRESSION: 1. No acute abnormality seen to explain the patient's symptoms. 2. Findings of hepatic cirrhosis. 3. Scattered diverticulosis along the descending and proximal sigmoid colon, without evidence of diverticulitis. 4. Mild bibasilar atelectasis, somewhat nodular at the left lung base. Electronically Signed   By: Francoise Schaumann.D.  On: 12/12/2016 21:21    Procedures Procedures (including critical care time)  Medications Ordered in ED Medications - No data to display   Initial Impression / Assessment and Plan / ED Course  I have reviewed the triage vital signs and the nursing notes.  Pertinent labs & imaging results that were available during my care of the patient were reviewed by me and considered in my medical decision making (see chart for details).     Workup negative. Unsure of cause of abdominal pain. Seizure likely breakthrough. Plan for dc with family with close PCP follow up.   Final Clinical Impressions(s) / ED Diagnoses   Final diagnoses:  Left flank pain     Merrily Pew, MD 12/12/16 2219

## 2016-12-12 NOTE — ED Triage Notes (Signed)
EMS reports being called out for abdominal pain, upon arrival reports from family of pt having seizure, EMS reports pt was post-ictal upon arrival and there was an empty small liquor bottle that pt had consumed as well as beer.  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

## 2016-12-23 ENCOUNTER — Encounter (HOSPITAL_COMMUNITY): Payer: Self-pay | Admitting: Emergency Medicine

## 2016-12-23 ENCOUNTER — Observation Stay (HOSPITAL_COMMUNITY)
Admission: EM | Admit: 2016-12-23 | Discharge: 2016-12-26 | Disposition: A | Discharge status: Home / Self Care | Payer: Medicare Other | Attending: Internal Medicine | Admitting: Internal Medicine

## 2016-12-23 DIAGNOSIS — I10 Essential (primary) hypertension: Secondary | ICD-10-CM | POA: Diagnosis not present

## 2016-12-23 DIAGNOSIS — N179 Acute kidney failure, unspecified: Secondary | ICD-10-CM | POA: Diagnosis not present

## 2016-12-23 DIAGNOSIS — K2210 Ulcer of esophagus without bleeding: Secondary | ICD-10-CM

## 2016-12-23 DIAGNOSIS — Z79899 Other long term (current) drug therapy: Secondary | ICD-10-CM | POA: Insufficient documentation

## 2016-12-23 DIAGNOSIS — K920 Hematemesis: Secondary | ICD-10-CM | POA: Diagnosis present

## 2016-12-23 DIAGNOSIS — K922 Gastrointestinal hemorrhage, unspecified: Secondary | ICD-10-CM | POA: Diagnosis not present

## 2016-12-23 DIAGNOSIS — Z87891 Personal history of nicotine dependence: Secondary | ICD-10-CM | POA: Diagnosis not present

## 2016-12-23 DIAGNOSIS — G40909 Epilepsy, unspecified, not intractable, without status epilepticus: Secondary | ICD-10-CM

## 2016-12-23 DIAGNOSIS — I6789 Other cerebrovascular disease: Secondary | ICD-10-CM | POA: Diagnosis present

## 2016-12-23 LAB — COMPREHENSIVE METABOLIC PANEL
ALT: 29 U/L (ref 14–54)
AST: 67 U/L — ABNORMAL HIGH (ref 15–41)
Albumin: 4 g/dL (ref 3.5–5.0)
Alkaline Phosphatase: 110 U/L (ref 38–126)
BUN: 24 mg/dL — ABNORMAL HIGH (ref 6–20)
CO2: >50 mmol/L — ABNORMAL HIGH (ref 22–32)
Calcium: 8.6 mg/dL — ABNORMAL LOW (ref 8.9–10.3)
Chloride: 67 mmol/L — ABNORMAL LOW (ref 101–111)
Creatinine, Ser: 2.23 mg/dL — ABNORMAL HIGH (ref 0.44–1.00)
GFR calc Af Amer: 27 mL/min — ABNORMAL LOW (ref >60)
GFR calc non Af Amer: 23 mL/min — ABNORMAL LOW (ref >60)
Glucose, Bld: 115 mg/dL — ABNORMAL HIGH (ref 65–99)
Potassium: 2.5 mmol/L — CL (ref 3.5–5.1)
Sodium: 134 mmol/L — ABNORMAL LOW (ref 135–145)
Total Bilirubin: 1 mg/dL (ref 0.3–1.2)
Total Protein: 8.5 g/dL — ABNORMAL HIGH (ref 6.5–8.1)

## 2016-12-23 LAB — CBC WITH DIFFERENTIAL/PLATELET
Basophils Absolute: 0 10*3/uL (ref 0.0–0.1)
Basophils Relative: 0 %
Eosinophils Absolute: 0 10*3/uL (ref 0.0–0.7)
Eosinophils Relative: 0 %
HCT: 35.5 % — ABNORMAL LOW (ref 36.0–46.0)
Hemoglobin: 11.9 g/dL — ABNORMAL LOW (ref 12.0–15.0)
Lymphocytes Relative: 16 %
Lymphs Abs: 1.1 10*3/uL (ref 0.7–4.0)
MCH: 31.6 pg (ref 26.0–34.0)
MCHC: 33.5 g/dL (ref 30.0–36.0)
MCV: 94.4 fL (ref 78.0–100.0)
Monocytes Absolute: 1 10*3/uL (ref 0.1–1.0)
Monocytes Relative: 14 %
Neutro Abs: 4.9 10*3/uL (ref 1.7–7.7)
Neutrophils Relative %: 70 %
Platelets: 133 10*3/uL — ABNORMAL LOW (ref 150–400)
RBC: 3.76 MIL/uL — ABNORMAL LOW (ref 3.87–5.11)
RDW: 13.8 % (ref 11.5–15.5)
WBC: 7 10*3/uL (ref 4.0–10.5)

## 2016-12-23 LAB — PROTIME-INR
INR: 1.15
Prothrombin Time: 14.7 seconds (ref 11.4–15.2)

## 2016-12-23 LAB — ETHANOL: Alcohol, Ethyl (B): <5 mg/dL (ref <5)

## 2016-12-23 MED ORDER — SODIUM CHLORIDE 0.9 % IV SOLN
Freq: Once | INTRAVENOUS | Status: AC
  Administered 2016-12-23: 23:00:00 via INTRAVENOUS

## 2016-12-23 MED ORDER — MAGNESIUM SULFATE 2 GM/50ML IV SOLN
2.0000 g | Freq: Once | INTRAVENOUS | Status: AC
  Administered 2016-12-24: 2 g via INTRAVENOUS
  Filled 2016-12-23: qty 50

## 2016-12-23 MED ORDER — PANTOPRAZOLE SODIUM 40 MG IV SOLR
40.0000 mg | Freq: Once | INTRAVENOUS | Status: AC
  Administered 2016-12-23: 40 mg via INTRAVENOUS
  Filled 2016-12-23: qty 40

## 2016-12-23 MED ORDER — POTASSIUM CHLORIDE 10 MEQ/100ML IV SOLN
10.0000 meq | Freq: Once | INTRAVENOUS | Status: AC
  Administered 2016-12-24: 10 meq via INTRAVENOUS
  Filled 2016-12-23: qty 100

## 2016-12-23 NOTE — ED Provider Notes (Signed)
[facility] DEPT Provider Note   CSN: [id number] Arrival date & time: 12/23/16  2140     History   Chief Complaint Chief Complaint  Patient presents with  . Hematemesis    HPI [name] is a 61 y.o. female. Patient presents with onset of vomiting up blood 5 times today. Also reports generalized abdominal pain since vomiting began. She reports an episode 2 years ago similar to this. States she knew that last time was due to her alcohol abuse, and states she has decreased her usage since then. She did have a drink yesterday. Did not eat anything today. Denies fever, diarrhea, constipation or blood in stool.  Last endoscopy was done 10/2014 which showed erosive gastritis and hiatal hernia with no evidence of gastric or esophageal varices. She was admitted and managed conservatively and successfuly last time. Reports noncompliance with her medications although she is on daily ASA.  HPI  Past Medical History:  Diagnosis Date  . Alcohol abuse   . Chronic abdominal pain   . Chronic diarrhea   . Contracture of muscle of hand    right  . HTN (hypertension)   . PUD (peptic ulcer disease)    remote  . Seizures ([place])    since stroke, no recent seizures  . Shortness of breath    with exertion  . 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 AKI (acute kidney injury) ([place]) 07/30/2016  . Anemia 11/25/2015  . GIB (gastrointestinal bleeding) 10/16/2015  . Abnormal LFTs   . Hematemesis with nausea   . ETOH abuse   . Thrombocytopenia ([place])   . GI bleed 10/14/2015  . Upper GI bleed 10/18/2014  . Hematemesis 10/18/2014  . Reflux esophagitis   . Alcohol intoxication ([place]) 10/10/2014  . Sinus tachycardia ([place]) 10/10/2014  . Hypokalemia 10/10/2014  . Seizure disorder ([place]) 10/10/2014  . History of rectal bleeding 09/05/2012  . Chronic diarrhea 09/05/2012  . Alcohol abuse 09/05/2012  . Vomiting 09/05/2012  .  Esophageal dysphagia 09/05/2012  . GERD (gastroesophageal reflux disease) 09/05/2012  . Upper abdominal pain 09/05/2012  . FX CLOSED TIBIA NOS 07/27/2007  . STROKE-With also h/o Aneurysm clipping? 07/25/2007  . SEIZURES 07/25/2007  . History of cardiovascular disorder 07/25/2007    Past Surgical History:  Procedure Laterality Date  . ABDOMINAL HYSTERECTOMY  2004   complete  . BRAIN SURGERY     age 31, stroke  . COLONOSCOPY    . COLONOSCOPY WITH PROPOFOL  10/03/2012   Dr. [name]: moderate diverticulosis, small internal hemorrhoids, TUBULAR ADENOMA. Next colonoscopy 2023 per Dr. [name].  . ESOPHAGOGASTRODUODENOSCOPY N/A 10/18/2014   Dr. Gala Romney during inpatient hospitalization: severe exudative/erosive reflux esophagitis as source of trivial upper GI bleed. No varices   . ESOPHAGOGASTRODUODENOSCOPY (EGD) WITH PROPOFOL  10/03/2012   Dr. [name]: stricture at [place] junction s/p dilation, mild gastritis negative H.pylori   . right tib-fib fracture  2008  . SAVORY DILATION  10/03/2012   Procedure: SAVORY DILATION;  Surgeon: Danie Binder, MD;  Location: [facility];  Service: Endoscopy;  Laterality: N/A;  started at 1303,  dilated 12.8-16mm    OB History    Gravida Para Term Preterm AB Living   4 3 3   1 3    SAB TAB Ectopic Multiple Live Births   1               Home Medications  Prior to Admission medications   Medication Sig Start Date End Date Taking? Authorizing Provider  albuterol (PROVENTIL HFA;VENTOLIN HFA) 108 (90 BASE) MCG/ACT inhaler Inhale 2 puffs into the lungs every 6 (six) hours as needed for wheezing or shortness of breath.    Yes Historical Provider, MD  aspirin EC 81 MG EC tablet Take 1 tablet (81 mg total) by mouth daily. 08/02/16  Yes [name], MD  HYDROcodone-acetaminophen (NORCO/VICODIN) 5-325 MG tablet Take 1 tablet by mouth every 6 (six) hours as needed for moderate pain.   Yes Historical Provider, MD  ipratropium-albuterol (DUONEB) 0.5-2.5 (3) MG/3ML SOLN Take  3 mLs by nebulization every 6 (six) hours as needed (for shortness of breath/wheezing).    Yes Historical Provider, MD  lisinopril-hydrochlorothiazide (PRINZIDE,ZESTORETIC) 20-12.5 MG per tablet Take 1 tablet by mouth daily.   Yes Historical Provider, MD  PHENObarbital (LUMINAL) 30 MG tablet Take 30 mg by mouth 2 (two) times daily.   Yes Historical Provider, MD  phenytoin (DILANTIN) 100 MG ER capsule Take 100 mg by mouth 3 (three) times daily.   Yes Historical Provider, MD  folic acid (FOLVITE) 1 MG tablet Take 1 tablet (1 mg total) by mouth daily. 08/02/16   [name], MD  omeprazole (PRILOSEC) 20 MG capsule Take 1 capsule (20 mg total) by mouth 2 (two) times daily before a meal. Patient taking differently: Take 20 mg by mouth daily.  11/25/15   [name], PA-C  solifenacin (VESICARE) 5 MG tablet Take 5 mg by mouth daily.    Historical Provider, MD  thiamine 100 MG tablet Take 1 tablet (100 mg total) by mouth daily. 08/02/16   [name], MD    Family History Family History  Problem Relation Age of Onset  . Liver disease Sister     etoh  . Colon cancer Neg Hx     Social History Social History  Substance Use Topics  . Smoking status: Former Smoker    Packs/day: 0.04    Years: 9.00    Types: Cigarettes    Quit date: 07/28/2014  . Smokeless tobacco: Never Used  . Alcohol use Yes     Comment: pt tried to drink today but was able to keep it down     Allergies   Patient has no known allergies.   Review of Systems Review of Systems  Constitutional: Positive for appetite change. Negative for fatigue and fever.  HENT: Negative for congestion, nosebleeds and sore throat.   Respiratory: Negative for cough, chest tightness and shortness of breath.   Cardiovascular: Negative for chest pain.  Gastrointestinal: Positive for abdominal pain, nausea and vomiting. Negative for blood in stool, constipation and diarrhea.  Genitourinary: Negative for difficulty urinating and  hematuria.  Skin: Negative for rash.  Neurological: Negative for dizziness, weakness and light-headedness.     Physical Exam Updated Vital Signs BP 156/74 (BP Location: Left Arm)   Pulse 111   Temp 98.7 F (37.1 C) (Oral)   Resp 14   Ht 5\' 4"  (1.626 m)   Wt 65.8 kg   SpO2 98%   BMI 24.89 kg/m   Physical Exam  Constitutional: She is oriented to person, place, and time. She appears well-developed and well-nourished.  HENT:  Head: Normocephalic and atraumatic.  Neck: Normal range of motion.  Cardiovascular: Regular rhythm and normal heart sounds.   tachycardic  Pulmonary/Chest: Effort normal and breath sounds normal.  Abdominal: Soft. Bowel sounds are normal. She exhibits no distension. There is tenderness. There  is no rebound and no guarding.  Musculoskeletal: She exhibits deformity (contracture of R hand/arm).  Neurological: She is alert and oriented to person, place, and time.     ED Treatments / Results  Labs (all labs ordered are listed, but only abnormal results are displayed) Labs Reviewed  CBC WITH DIFFERENTIAL/PLATELET - Abnormal; Notable for the following:       Result Value   RBC 3.76 (*)    Hemoglobin 11.9 (*)    HCT 35.5 (*)    Platelets 133 (*)    All other components within normal limits  COMPREHENSIVE METABOLIC PANEL - Abnormal; Notable for the following:    Sodium 134 (*)    Potassium 2.5 (*)    Chloride 67 (*)    CO2 >50 (*)    Glucose, Bld 115 (*)    BUN 24 (*)    Creatinine, Ser 2.23 (*)    Calcium 8.6 (*)    Total Protein 8.5 (*)    AST 67 (*)    GFR calc non Af Amer 23 (*)    GFR calc Af Amer 27 (*)    All other components within normal limits  PROTIME-INR  ETHANOL  TYPE AND SCREEN    EKG  EKG Interpretation None       Radiology No results found.  Procedures Procedures (including critical care time)  Medications Ordered in ED Medications  potassium chloride 10 mEq in 100 mL IVPB (not administered)  magnesium sulfate  IVPB 2 g 50 mL (not administered)  pantoprazole (PROTONIX) injection 40 mg (40 mg Intravenous Given 12/23/16 2247)  0.9 %  sodium chloride infusion ( Intravenous New Bag/Given 12/23/16 2247)     Initial Impression / Assessment and Plan / ED Course  I have reviewed the triage vital signs and the nursing notes.  Pertinent labs & imaging results that were available during my care of the patient were reviewed by me and considered in my medical decision making (see chart for details).     Patient's condition and history of alcohol abuse is concerning for upper GI bleed, though she is hemodynamically stable at this time. Previous endoscopy was done ~64yr ago which showed erosive gastritis but no evidence of varices. She has not vomited since presenting to ED and tachycardia has improved. T&S were obtained. Potassium (2.5) and magnesium were repleted. Labs showed acute kidney injury (Cr 2.23). Dr. Sabra Heck spoke to admitting doctor. Will be need to admitted for further evaluation and observation with labs pending.  Final Clinical Impressions(s) / ED Diagnoses   Final diagnoses:  Upper GI bleed  Acute kidney injury [facility]    New Prescriptions New Prescriptions   No medications on file     [place], [place] 12/23/16 [place], [place] 12/23/16 2355    Noemi Chapel, MD 12/24/16 [phone]

## 2016-12-23 NOTE — ED Triage Notes (Signed)
Pt states that she has been vomiting for 4 days at first there was no blood in but now it is all blood per the patient

## 2016-12-23 NOTE — ED Notes (Signed)
CRITICAL VALUE ALERT  Critical value received:  K+ 2.5  Date of notification:  12/23/16  Time of notification:  2340  Critical value read back: yes  Nurse who received alert:  Kendell Bane rn  MD notified (1st page):  millner  Time of first page:  2340  MD notified (2nd page):  Time of second page:  Responding MD:  Jerrol Banana  Time MD responded:  2340

## 2016-12-23 NOTE — ED Provider Notes (Signed)
The patient is a 61 year old female with a history of erosive gastritis who presents to the hospital with a complaint of upper GI bleeding which has been going on throughout the day. 5 episodes of dark red blood or maroon colored blood per mouth. She has some epigastric tenderness on exam and a mild tachycardia of approximately 110 bpm. Prior endoscopy revealed that she had no gastric or esophageal varices. She has not vomited any blood loss she has been here.  10 ROS were viewed and were otherwise negative other than those listed in HPI  On exam, she has minimal epigastric pain - no guarding and no peritoneal signs - she has mild tachycardia - has R sided UE and LE weakness and UE contraction from prior stroke.  Labs show only minimal anemia, other labs pending, discussed with the hospitalist at 11:30, appreciate his timely call return and assistance with admission. - Dr. Darrick Meigs to admit.  The pt has been given IVF rescucitation, has continued to have tachycardia, she has no significant anemia, but labs show some significant abnormalities including AKI, hypokalemia (replaced).  Medical screening examination/treatment/procedure(s) were conducted as a shared visit with non-physician practitioner(s) and myself.  I personally evaluated the patient during the encounter.  Clinical Impression:   Final diagnoses:  Upper GI bleed  Acute kidney injury ([place])  Hypokalemia       Noemi Chapel, MD 12/24/16 [phone]

## 2016-12-24 DIAGNOSIS — K2210 Ulcer of esophagus without bleeding: Secondary | ICD-10-CM

## 2016-12-24 DIAGNOSIS — I6789 Other cerebrovascular disease: Secondary | ICD-10-CM | POA: Diagnosis not present

## 2016-12-24 DIAGNOSIS — G40909 Epilepsy, unspecified, not intractable, without status epilepticus: Secondary | ICD-10-CM | POA: Diagnosis not present

## 2016-12-24 DIAGNOSIS — K922 Gastrointestinal hemorrhage, unspecified: Secondary | ICD-10-CM

## 2016-12-24 DIAGNOSIS — F1010 Alcohol abuse, uncomplicated: Secondary | ICD-10-CM

## 2016-12-24 LAB — COMPREHENSIVE METABOLIC PANEL
ALT: 24 U/L (ref 14–54)
AST: 51 U/L — ABNORMAL HIGH (ref 15–41)
Albumin: 3.6 g/dL (ref 3.5–5.0)
Alkaline Phosphatase: 97 U/L (ref 38–126)
Anion gap: 12 (ref 5–15)
BUN: 21 mg/dL — ABNORMAL HIGH (ref 6–20)
CO2: 44 mmol/L — ABNORMAL HIGH (ref 22–32)
Calcium: 8.5 mg/dL — ABNORMAL LOW (ref 8.9–10.3)
Chloride: 78 mmol/L — ABNORMAL LOW (ref 101–111)
Creatinine, Ser: 1.62 mg/dL — ABNORMAL HIGH (ref 0.44–1.00)
GFR calc Af Amer: 39 mL/min — ABNORMAL LOW (ref >60)
GFR calc non Af Amer: 34 mL/min — ABNORMAL LOW (ref >60)
Glucose, Bld: 118 mg/dL — ABNORMAL HIGH (ref 65–99)
Potassium: 2.3 mmol/L — CL (ref 3.5–5.1)
Sodium: 134 mmol/L — ABNORMAL LOW (ref 135–145)
Total Bilirubin: 1.4 mg/dL — ABNORMAL HIGH (ref 0.3–1.2)
Total Protein: 7.7 g/dL (ref 6.5–8.1)

## 2016-12-24 LAB — URINALYSIS, ROUTINE W REFLEX MICROSCOPIC
Bacteria, UA: NONE SEEN
Bilirubin Urine: NEGATIVE
Glucose, UA: NEGATIVE mg/dL
Hgb urine dipstick: NEGATIVE
Ketones, ur: NEGATIVE mg/dL
Leukocytes, UA: NEGATIVE
Nitrite: NEGATIVE
Protein, ur: 100 mg/dL — AB
Specific Gravity, Urine: 1.012 (ref 1.005–1.030)
pH: 8 (ref 5.0–8.0)

## 2016-12-24 LAB — CBC
HCT: 33.8 % — ABNORMAL LOW (ref 36.0–46.0)
Hemoglobin: 11.4 g/dL — ABNORMAL LOW (ref 12.0–15.0)
MCH: 31.5 pg (ref 26.0–34.0)
MCHC: 33.7 g/dL (ref 30.0–36.0)
MCV: 93.4 fL (ref 78.0–100.0)
Platelets: 122 10*3/uL — ABNORMAL LOW (ref 150–400)
RBC: 3.62 MIL/uL — ABNORMAL LOW (ref 3.87–5.11)
RDW: 13.8 % (ref 11.5–15.5)
WBC: 7.1 10*3/uL (ref 4.0–10.5)

## 2016-12-24 LAB — BASIC METABOLIC PANEL
Anion gap: 13 (ref 5–15)
BUN: 24 mg/dL — ABNORMAL HIGH (ref 6–20)
CO2: 47 mmol/L — ABNORMAL HIGH (ref 22–32)
Calcium: 8.2 mg/dL — ABNORMAL LOW (ref 8.9–10.3)
Chloride: 73 mmol/L — ABNORMAL LOW (ref 101–111)
Creatinine, Ser: 2 mg/dL — ABNORMAL HIGH (ref 0.44–1.00)
GFR calc Af Amer: 30 mL/min — ABNORMAL LOW (ref >60)
GFR calc non Af Amer: 26 mL/min — ABNORMAL LOW (ref >60)
Glucose, Bld: 113 mg/dL — ABNORMAL HIGH (ref 65–99)
Potassium: 2.3 mmol/L — CL (ref 3.5–5.1)
Sodium: 133 mmol/L — ABNORMAL LOW (ref 135–145)

## 2016-12-24 LAB — TYPE AND SCREEN
ABO/RH(D): O POS
Antibody Screen: NEGATIVE

## 2016-12-24 LAB — MAGNESIUM: Magnesium: 2.3 mg/dL (ref 1.7–2.4)

## 2016-12-24 MED ORDER — SODIUM CHLORIDE 0.9 % IV SOLN
INTRAVENOUS | Status: DC
  Administered 2016-12-24: 01:00:00 via INTRAVENOUS

## 2016-12-24 MED ORDER — POTASSIUM CHLORIDE 10 MEQ/100ML IV SOLN
10.0000 meq | INTRAVENOUS | Status: AC
  Administered 2016-12-24 (×3): 10 meq via INTRAVENOUS
  Filled 2016-12-24 (×3): qty 100

## 2016-12-24 MED ORDER — PANTOPRAZOLE SODIUM 40 MG IV SOLR
40.0000 mg | Freq: Two times a day (BID) | INTRAVENOUS | Status: DC
  Administered 2016-12-24 (×2): 40 mg via INTRAVENOUS
  Filled 2016-12-24 (×2): qty 40

## 2016-12-24 MED ORDER — PHENOBARBITAL SODIUM 130 MG/ML IJ SOLN
30.0000 mg | Freq: Two times a day (BID) | INTRAMUSCULAR | Status: DC
  Administered 2016-12-24 – 2016-12-25 (×5): 30 mg via INTRAVENOUS
  Filled 2016-12-24 (×5): qty 1

## 2016-12-24 MED ORDER — ONDANSETRON HCL 4 MG PO TABS: 4.0000 mg | ORAL_TABLET | Freq: Four times a day (QID) | ORAL | Status: DC | PRN

## 2016-12-24 MED ORDER — ALBUTEROL SULFATE HFA 108 (90 BASE) MCG/ACT IN AERS: 2.0000 | INHALATION_SPRAY | Freq: Four times a day (QID) | RESPIRATORY_TRACT | Status: DC | PRN

## 2016-12-24 MED ORDER — KCL IN DEXTROSE-NACL 40-5-0.45 MEQ/L-%-% IV SOLN
INTRAVENOUS | Status: DC
  Administered 2016-12-24 – 2016-12-26 (×3): via INTRAVENOUS

## 2016-12-24 MED ORDER — PHENYTOIN SODIUM 50 MG/ML IJ SOLN
100.0000 mg | Freq: Three times a day (TID) | INTRAMUSCULAR | Status: DC
  Administered 2016-12-24 – 2016-12-26 (×7): 100 mg via INTRAVENOUS
  Filled 2016-12-24 (×6): qty 2

## 2016-12-24 MED ORDER — ONDANSETRON HCL 4 MG/2ML IJ SOLN: 4.0000 mg | Freq: Four times a day (QID) | INTRAMUSCULAR | Status: DC | PRN

## 2016-12-24 MED ORDER — IPRATROPIUM-ALBUTEROL 0.5-2.5 (3) MG/3ML IN SOLN: 3.0000 mL | Freq: Four times a day (QID) | RESPIRATORY_TRACT | Status: DC | PRN

## 2016-12-24 MED ORDER — POTASSIUM CHLORIDE 10 MEQ/100ML IV SOLN
10.0000 meq | INTRAVENOUS | Status: AC
  Administered 2016-12-24 (×3): 10 meq via INTRAVENOUS
  Filled 2016-12-24: qty 100

## 2016-12-24 NOTE — H&P (Addendum)
[facility] H&P    Patient Demographics:    [name], is a 61 y.o. female  MRN: [id number]  DOB - [birth year]  Admit Date - 12/23/2016  Referring MD/NP/PA: Dr. Hyacinth Meeker  Outpatient Primary MD for the patient is Avon Gully, MD  Patient coming from: Home  Chief Complaint  Patient presents with  . Hematemesis      HPI:    [name]  is a 61 y.o. 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 Patient says that she had 6 or 7 episodes of  coffee-ground emesis since this morning. She denies diarrhea, constipation. No chest pain or shortness of breath. She has history of erosive gastritis with esophagitis seen on EGD on 10/18/2014, also had colonoscopy in 2013 which showed diverticulosis with internal hemorrhoids. Patient takes daily aspirin.  In the ED patient has not had any vomiting episodes, hemoglobin is stable at 11.9.    Review of systems:      A full 10 point Review of Systems was done, except as stated above, all other Review of Systems were negative.   With Past History of the following :    Past Medical History:  Diagnosis Date  . Alcohol abuse   . Chronic abdominal pain   . Chronic diarrhea   . Contracture of muscle of hand    right  . HTN (hypertension)   . PUD (peptic ulcer disease)    remote  . Seizures ([facility])    since stroke, no recent seizures  . Shortness of breath    with exertion  . Stroke [facility])    age 75, required brain surgery      Past Surgical History:  Procedure Laterality Date  . ABDOMINAL HYSTERECTOMY  2004   complete  . BRAIN SURGERY     age 6, stroke  . COLONOSCOPY    . COLONOSCOPY WITH PROPOFOL  10/03/2012   Dr. Darrick Penna:  moderate diverticulosis, small internal hemorrhoids, TUBULAR ADENOMA. Next colonoscopy 2023 per Dr. Darrick Penna.  . ESOPHAGOGASTRODUODENOSCOPY N/A 10/18/2014   Dr. Jena Gauss during inpatient hospitalization: severe exudative/erosive reflux esophagitis as source of trivial upper GI bleed. No varices   . ESOPHAGOGASTRODUODENOSCOPY (EGD) WITH PROPOFOL  10/03/2012   Dr. Darrick Penna: stricture at GE junction s/p dilation, mild gastritis negative H.pylori   . right tib-fib fracture  2008  . SAVORY DILATION  10/03/2012   Procedure: SAVORY DILATION;  Surgeon: West Bali, MD;  Location: [facility];  Service: Endoscopy;  Laterality: N/A;  started at 1303,  dilated 12.8-16mm      Social History:      Social History  Substance Use Topics  . Smoking status: Former Smoker    Packs/day: 0.04    Years: 9.00    Types: Cigarettes    Quit date: 07/28/2014  . Smokeless tobacco: Never Used  . Alcohol use Yes     Comment:  pt tried to drink today but was able to keep it down       Family History :     Family History  Problem Relation Age of Onset  . Liver disease Sister     etoh  . Colon cancer Neg Hx       Home Medications:   Prior to Admission medications   Medication Sig Start Date End Date Taking? Authorizing Provider  albuterol (PROVENTIL HFA;VENTOLIN HFA) 108 (90 BASE) MCG/ACT inhaler Inhale 2 puffs into the lungs every 6 (six) hours as needed for wheezing or shortness of breath.    Yes Historical Provider, MD  aspirin EC 81 MG EC tablet Take 1 tablet (81 mg total) by mouth daily. 08/02/16  Yes Avon Gully, MD  HYDROcodone-acetaminophen (NORCO/VICODIN) 5-325 MG tablet Take 1 tablet by mouth every 6 (six) hours as needed for moderate pain.   Yes Historical Provider, MD  ipratropium-albuterol (DUONEB) 0.5-2.5 (3) MG/3ML SOLN Take 3 mLs by nebulization every 6 (six) hours as needed (for shortness of breath/wheezing).    Yes Historical Provider, MD  lisinopril-hydrochlorothiazide (PRINZIDE,ZESTORETIC)  20-12.5 MG per tablet Take 1 tablet by mouth daily.   Yes Historical Provider, MD  PHENObarbital (LUMINAL) 30 MG tablet Take 30 mg by mouth 2 (two) times daily.   Yes Historical Provider, MD  phenytoin (DILANTIN) 100 MG ER capsule Take 100 mg by mouth 3 (three) times daily.   Yes Historical Provider, MD  folic acid (FOLVITE) 1 MG tablet Take 1 tablet (1 mg total) by mouth daily. 08/02/16   Avon Gully, MD  omeprazole (PRILOSEC) 20 MG capsule Take 1 capsule (20 mg total) by mouth 2 (two) times daily before a meal. Patient taking differently: Take 20 mg by mouth daily.  11/25/15   Tiffany Kocher, PA-C  solifenacin (VESICARE) 5 MG tablet Take 5 mg by mouth daily.    Historical Provider, MD  thiamine 100 MG tablet Take 1 tablet (100 mg total) by mouth daily. 08/02/16   Avon Gully, MD     Allergies:    No Known Allergies   Physical Exam:   Vitals  Blood pressure 156/74, pulse 111, temperature 98.7 F (37.1 C), temperature source Oral, resp. rate 14, height 5\' 4"  (1.626 m), weight 65.8 kg (145 lb), SpO2 98 %.  1.  General: Appears in no acute distress  2. Psychiatric:  Intact judgement and  insight, awake alert, oriented x 3.  3. Neurologic: No focal neurological deficits, all cranial nerves intact.upper right extremity hemiparesis  4. Eyes :  anicteric sclerae, moist conjunctivae with no lid lag. PERRLA.  5. ENMT:  Oropharynx clear with moist mucous membranes and good dentition  6. Neck:  supple, no cervical lymphadenopathy appriciated, No thyromegaly  7. Respiratory : Normal respiratory effort, good air movement bilaterally,clear to  auscultation bilaterally  8. Cardiovascular : RRR, no gallops, rubs or murmurs, no leg edema  9. Gastrointestinal:  Positive bowel sounds, abdomen soft, non-tender to palpation,no hepatosplenomegaly, no rigidity or guarding       10. Skin:  No cyanosis, normal texture and turgor, no rash, lesions or ulcers  11.Musculoskeletal:  Good  muscle tone,  joints appear normal , no effusions,  normal range of motion    Data Review:    CBC  Recent Labs Lab 12/23/16 2240  WBC 7.0  HGB 11.9*  HCT 35.5*  PLT 133*  MCV 94.4  MCH 31.6  MCHC 33.5  RDW 13.8  LYMPHSABS 1.1  MONOABS 1.0  EOSABS 0.0  BASOSABS 0.0   ------------------------------------------------------------------------------------------------------------------  Chemistries   Recent Labs Lab 12/23/16 2240  NA 134*  K 2.5*  CL 67*  CO2 >50*  GLUCOSE 115*  BUN 24*  CREATININE 2.23*  CALCIUM 8.6*  AST 67*  ALT 29  ALKPHOS 110  BILITOT 1.0   ------------------------------------------------------------------------------------------------------------------  ------------------------------------------------------------------------------------------------------------------ GFR: Estimated Creatinine Clearance: 25.3 mL/min (by C-G formula based on SCr of 2.23 mg/dL (H)). Liver Function Tests:  Recent Labs Lab 12/23/16 2240  AST 67*  ALT 29  ALKPHOS 110  BILITOT 1.0  PROT 8.5*  ALBUMIN 4.0   No results for input(s): LIPASE, AMYLASE in the last 168 hours. No results for input(s): AMMONIA in the last 168 hours. Coagulation Profile:  Recent Labs Lab 12/23/16 2240  INR 1.15    --------------------------------------------------------------------------------------------------------------- Urine analysis:    Component Value Date/Time   COLORURINE STRAW (A) 12/12/2016 1928   APPEARANCEUR CLEAR 12/12/2016 1928   LABSPEC 1.003 (L) 12/12/2016 1928   PHURINE 6.0 12/12/2016 1928   GLUCOSEU NEGATIVE 12/12/2016 1928   HGBUR NEGATIVE 12/12/2016 1928   BILIRUBINUR NEGATIVE 12/12/2016 1928   KETONESUR NEGATIVE 12/12/2016 1928   PROTEINUR NEGATIVE 12/12/2016 1928   UROBILINOGEN 0.2 12/26/2014 1927   NITRITE NEGATIVE 12/12/2016 1928   LEUKOCYTESUR NEGATIVE 12/12/2016 1928      Imaging Results:       Assessment & Plan:    Active  Problems:   STROKE-With also h/o Aneurysm clipping?   Seizure disorder ([facility])   UGI bleed   1. Hematemesis- patient has history of erosive gastritis and esophagitis. She has not had vomiting since she came to ED. Will keep her nothing by mouth, continue IV Protonix 40 mg every 12 hours. We'll consult GI in a.m. 2. Hypokalemia- potassium is 2.5, will replace potassium and check BMP in a.m. 3. Acute kidney injury- patient's BUN/creatinine is 24/2.23, changed from previous BUN/creatinine of 5/0.55 from 12/12/2016. We'll start IV normal saline and follow BMP in a.m. 4. History of seizures- will switch both Dilantin and phenobarbital IV. Continue phenobarbital 30 mg IV every 12 hours, Dilantin 100 mg 3 times a day. 5. Metabolic alkalosis- CO2 greater than 50 , likely from intractable vomiting, patient started on normal saline at 100 ML per hour. Repeat BMP has been ordered in the ED   DVT Prophylaxis-   SCD's  AM Labs Ordered, also please review Full Orders  Family Communication: Admission, patients condition and plan of care including tests being ordered have been discussed with the patient  who indicate understanding and agree with the plan and Code Status.  Code Status:  Full code  Admission status: Observation    Time spent in minutes : 60 minutes   Kaloni Bisaillon S M.D on 12/24/2016 at 12:38 AM  Between 7am to 7pm - Pager - [phone]. After 7pm go to [email or web address] - password [facility]  [facility] - Office  [phone]

## 2016-12-24 NOTE — Progress Notes (Signed)
CRITICAL VALUE ALERT  Critical value received:  Potassium=2.3  Date of notification:  12/24/16  Time of notification:  0625  Critical value read back:Yes.    Nurse who received alert:  Concha Pyo, RN  MD notified (1st page):  Georgiann Mohs  Time of first page:  0626  MD notified (2nd page):  Time of second page:  Responding MD:  Georgiann Mohs  Time MD responded: 9562   New orders given and placed into [facility] to be carried out.  Nursing staff to continue to monitor

## 2016-12-24 NOTE — Progress Notes (Signed)
Subjective: Patient was admitted due to hematemesis. No significant drop in H/H. Denies alcohol recently. Her K+ was low and BUN/Cr was elevated.  Objective: Vital signs in last 24 hours: Temp:  [98.7 F (37.1 C)-98.8 F (37.1 C)] 98.7 F (37.1 C) (03/09 0500) Pulse Rate:  [94-111] 94 (03/09 0500) Resp:  [14-23] 18 (03/09 0500) BP: (130-159)/(56-136) 137/59 (03/09 0500) SpO2:  [91 %-98 %] 91 % (03/09 0500) Weight:  [65.8 kg (145 lb)] 65.8 kg (145 lb) (03/08 2154) Weight change:  Last BM Date: 12/23/16  Intake/Output from previous day: 03/08 0701 - 03/09 0700 In: 470 [I.V.:170; IV Piggyback:300] Out: -   PHYSICAL EXAM General appearance: alert and no distress Resp: clear to auscultation bilaterally Cardio: S1, S2 normal GI: soft, non-tender; bowel sounds normal; no masses,  no organomegaly Extremities: extremities normal, atraumatic, no cyanosis or edema  Lab Results:  Results for orders placed or performed during the hospital encounter of 12/23/16 (from the past 48 hour(s))  CBC with Differential     Status: Abnormal   Collection Time: 12/23/16 10:40 PM  Result Value Ref Range   WBC 7.0 4.0 - 10.5 K/uL   RBC 3.76 (L) 3.87 - 5.11 MIL/uL   Hemoglobin 11.9 (L) 12.0 - 15.0 g/dL   HCT 35.5 (L) 36.0 - 46.0 %   MCV 94.4 78.0 - 100.0 fL   MCH 31.6 26.0 - 34.0 pg   MCHC 33.5 30.0 - 36.0 g/dL   RDW 13.8 11.5 - 15.5 %   Platelets 133 (L) 150 - 400 K/uL   Neutrophils Relative % 70 %   Neutro Abs 4.9 1.7 - 7.7 K/uL   Lymphocytes Relative 16 %   Lymphs Abs 1.1 0.7 - 4.0 K/uL   Monocytes Relative 14 %   Monocytes Absolute 1.0 0.1 - 1.0 K/uL   Eosinophils Relative 0 %   Eosinophils Absolute 0.0 0.0 - 0.7 K/uL   Basophils Relative 0 %   Basophils Absolute 0.0 0.0 - 0.1 K/uL  Protime-INR     Status: None   Collection Time: 12/23/16 10:40 PM  Result Value Ref Range   Prothrombin Time 14.7 11.4 - 15.2 seconds   INR 1.15   Comprehensive metabolic panel     Status: Abnormal   Collection Time: 12/23/16 10:40 PM  Result Value Ref Range   Sodium 134 (L) 135 - 145 mmol/L   Potassium 2.5 (LL) 3.5 - 5.1 mmol/L    Comment: CRITICAL RESULT CALLED TO, READ BACK BY AND VERIFIED WITH: WINNING[place]AM,C AT 2340 ON 12/23/2016 BY ISLEY,B    Chloride 67 (L) 101 - 111 mmol/L   CO2 >50 (H) 22 - 32 mmol/L   Glucose, Bld 115 (H) 65 - 99 mg/dL   BUN 24 (H) 6 - 20 mg/dL   Creatinine, Ser 2.23 (H) 0.44 - 1.00 mg/dL   Calcium 8.6 (L) 8.9 - 10.3 mg/dL   Total Protein 8.5 (H) 6.5 - 8.1 g/dL   Albumin 4.0 3.5 - 5.0 g/dL   AST 67 (H) 15 - 41 U/L   ALT 29 14 - 54 U/L   Alkaline Phosphatase 110 38 - 126 U/L   Total Bilirubin 1.0 0.3 - 1.2 mg/dL   GFR calc non Af Amer 23 (L) >60 mL/min   GFR calc Af Amer 27 (L) >60 mL/min    Comment: (NOTE) The eGFR has been calculated using the CKD EPI equation. This calculation has not been validated in all clinical situations. eGFR's persistently <60 mL/min signify possible Chronic  Kidney Disease.   Ethanol     Status: None   Collection Time: 12/23/16 10:50 PM  Result Value Ref Range   Alcohol, Ethyl (B) <5 <5 mg/dL    Comment:        LOWEST DETECTABLE LIMIT FOR SERUM ALCOHOL IS 5 mg/dL FOR MEDICAL PURPOSES ONLY   Type and screen [facility]     Status: None   Collection Time: 12/23/16 10:50 PM  Result Value Ref Range   ABO/RH(D) O POS    Antibody Screen NEG    Sample Expiration 91/47/8295   Basic metabolic panel     Status: Abnormal   Collection Time: 12/24/16 12:19 AM  Result Value Ref Range   Sodium 133 (L) 135 - 145 mmol/L   Potassium 2.3 (LL) 3.5 - 5.1 mmol/L    Comment: CRITICAL RESULT CALLED TO, READ BACK BY AND VERIFIED WITH: BULOCK,T AT 0105 ON 12/24/2016 BY ISLEY,B    Chloride 73 (L) 101 - 111 mmol/L   CO2 47 (H) 22 - 32 mmol/L   Glucose, Bld 113 (H) 65 - 99 mg/dL   BUN 24 (H) 6 - 20 mg/dL   Creatinine, Ser 2.00 (H) 0.44 - 1.00 mg/dL   Calcium 8.2 (L) 8.9 - 10.3 mg/dL   GFR calc non Af Amer 26 (L) >60 mL/min   GFR  calc Af Amer 30 (L) >60 mL/min    Comment: (NOTE) The eGFR has been calculated using the CKD EPI equation. This calculation has not been validated in all clinical situations. eGFR's persistently <60 mL/min signify possible Chronic Kidney Disease.    Anion gap 13 5 - 15  Urinalysis, Routine w reflex microscopic     Status: Abnormal   Collection Time: 12/24/16 12:19 AM  Result Value Ref Range   Color, Urine YELLOW YELLOW   APPearance CLEAR CLEAR   Specific Gravity, Urine 1.012 1.005 - 1.030   pH 8.0 5.0 - 8.0   Glucose, UA NEGATIVE NEGATIVE mg/dL   Hgb urine dipstick NEGATIVE NEGATIVE   Bilirubin Urine NEGATIVE NEGATIVE   Ketones, ur NEGATIVE NEGATIVE mg/dL   Protein, ur 100 (A) NEGATIVE mg/dL   Nitrite NEGATIVE NEGATIVE   Leukocytes, UA NEGATIVE NEGATIVE   RBC / HPF 0-5 0 - 5 RBC/hpf   WBC, UA 0-5 0 - 5 WBC/hpf   Bacteria, UA NONE SEEN NONE SEEN   Mucous PRESENT    Hyaline Casts, UA PRESENT   CBC     Status: Abnormal   Collection Time: 12/24/16  5:37 AM  Result Value Ref Range   WBC 7.1 4.0 - 10.5 K/uL   RBC 3.62 (L) 3.87 - 5.11 MIL/uL   Hemoglobin 11.4 (L) 12.0 - 15.0 g/dL   HCT 33.8 (L) 36.0 - 46.0 %   MCV 93.4 78.0 - 100.0 fL   MCH 31.5 26.0 - 34.0 pg   MCHC 33.7 30.0 - 36.0 g/dL   RDW 13.8 11.5 - 15.5 %   Platelets 122 (L) 150 - 400 K/uL  Comprehensive metabolic panel     Status: Abnormal   Collection Time: 12/24/16  5:37 AM  Result Value Ref Range   Sodium 134 (L) 135 - 145 mmol/L   Potassium 2.3 (LL) 3.5 - 5.1 mmol/L    Comment: CRITICAL RESULT CALLED TO, READ BACK BY AND VERIFIED WITH: BULLOCK,T AT 6:25AM ON 12/24/16 BY FESTERMAN,C    Chloride 78 (L) 101 - 111 mmol/L   CO2 44 (H) 22 - 32 mmol/L   Glucose, Bld  118 (H) 65 - 99 mg/dL   BUN 21 (H) 6 - 20 mg/dL   Creatinine, Ser 1.62 (H) 0.44 - 1.00 mg/dL   Calcium 8.5 (L) 8.9 - 10.3 mg/dL   Total Protein 7.7 6.5 - 8.1 g/dL   Albumin 3.6 3.5 - 5.0 g/dL   AST 51 (H) 15 - 41 U/L   ALT 24 14 - 54 U/L   Alkaline  Phosphatase 97 38 - 126 U/L   Total Bilirubin 1.4 (H) 0.3 - 1.2 mg/dL   GFR calc non Af Amer 34 (L) >60 mL/min   GFR calc Af Amer 39 (L) >60 mL/min    Comment: (NOTE) The eGFR has been calculated using the CKD EPI equation. This calculation has not been validated in all clinical situations. eGFR's persistently <60 mL/min signify possible Chronic Kidney Disease.    Anion gap 12 5 - 15  Magnesium     Status: None   Collection Time: 12/24/16  5:37 AM  Result Value Ref Range   Magnesium 2.3 1.7 - 2.4 mg/dL    ABGS No results for input(s): PHART, PO2ART, TCO2, HCO3 in the last 72 hours.  Invalid input(s): PCO2 CULTURES No results found for this or any previous visit (from the past 240 hour(s)). Studies/Results: No results found.  Medications: I have reviewed the patient's current medications.  Assesment:  Active Problems:   STROKE-With also h/o Aneurysm clipping?   Seizure disorder ([place])   UGI bleed hypokalemia Acute kidney injury   Plan:  Medications reviewed Will continue PPI Will continue to replace K+ Will monitor CBC/BMP GI consult    LOS: 0 days   [name] 12/24/2016, 8:07 AM

## 2016-12-24 NOTE — Progress Notes (Signed)
CRITICAL VALUE ALERT  Critical value received:  Potassium=2.3  Date of notification:  12/24/16  Time of notification:  0105  Critical value read back:Yes.    Nurse who received alert:  Concha Pyo, RN  MD notified (1st page):    Time of first page:    MD notified (2nd page):  Time of second page:  Responding MD:    Time MD responded: Value consistent with previous reported value patient has orders to replace potassium via IV infusion.  Nursing staff to continue to montior

## 2016-12-24 NOTE — Care Management Obs Status (Signed)
[place] NOTIFICATION   Patient Details  Name: AKANSHA WYCHE MRN: [id number] Date of Birth: [birth year]   Medicare Observation Status Notification Given:  Yes    Sherald Barge, RN 12/24/2016, 11:18 AM

## 2016-12-24 NOTE — Consult Note (Signed)
Referring Provider: [facility] Primary Care Physician:  [name], MD Primary Gastroenterologist:  Dr. [name]  Date of Admission: 12/24/16 Date of Consultation: 12/24/16  Reason for Consultation:  UGI bleed/hematemesis  HPI:  [name] is a 61 y.o. female with a past medical history of alcohol abuse, chronic abdominal pain, chronic diarrhea, erosive reflux esophagitis, PUD, CVA. She presented to the ER with complaints of 6-7 episodes of hematemesis as coffee-ground emesis. Last EGD 10/18/2014 which found severe exudative/erosive esophagitis, markedly inflamed esophageal mucosa as likely source of hematemesis in the setting of chronic alcohol abuse. She was treated with bid PPI therapy and carafate. She did not have any vomiting in the ER. Hgb on presentation was found to be 11.9 (baseline 10.3 to 12.4).   Today she confirms the above information. She states she did have 6-7 episodes of vomiting with coffee-ground emesis. Denies any hematochezia or melena. Admits continued nausea, denies any further vomiting. Some continued abdominal discomfort. No other GI symptoms at this time.  Past Medical History:  Diagnosis Date  . Alcohol abuse   . Chronic abdominal pain   . Chronic diarrhea   . Contracture of muscle of hand    right  . HTN (hypertension)   . PUD (peptic ulcer disease)    remote  . Seizures ([place])    since stroke, no recent seizures  . Shortness of breath    with exertion  . Stroke [facility])    age 61, required brain surgery    Past Surgical History:  Procedure Laterality Date  . ABDOMINAL HYSTERECTOMY  2004   complete  . BRAIN SURGERY     age 44, stroke  . COLONOSCOPY    . COLONOSCOPY WITH PROPOFOL  10/03/2012   Dr. [name]: moderate diverticulosis, small internal hemorrhoids, TUBULAR ADENOMA. Next colonoscopy 2023 per Dr. [name].  . ESOPHAGOGASTRODUODENOSCOPY N/A 10/18/2014   Dr. Gala Romney during inpatient hospitalization: severe exudative/erosive reflux  esophagitis as source of trivial upper GI bleed. No varices   . ESOPHAGOGASTRODUODENOSCOPY (EGD) WITH PROPOFOL  10/03/2012   Dr. [name]: stricture at Wisdom junction s/p dilation, mild gastritis negative H.pylori   . right tib-fib fracture  2008  . SAVORY DILATION  10/03/2012   Procedure: SAVORY DILATION;  Surgeon: Danie Binder, MD;  Location: [facility];  Service: Endoscopy;  Laterality: N/A;  started at 1303,  dilated 12.8-16mm    Prior to Admission medications   Medication Sig Start Date End Date Taking? Authorizing Provider  albuterol (PROVENTIL HFA;VENTOLIN HFA) 108 (90 BASE) MCG/ACT inhaler Inhale 2 puffs into the lungs every 6 (six) hours as needed for wheezing or shortness of breath.    Yes Historical Provider, MD  aspirin EC 81 MG EC tablet Take 1 tablet (81 mg total) by mouth daily. 08/02/16  Yes [name], MD  HYDROcodone-acetaminophen (NORCO/VICODIN) 5-325 MG tablet Take 1 tablet by mouth every 6 (six) hours as needed for moderate pain.   Yes Historical Provider, MD  ipratropium-albuterol (DUONEB) 0.5-2.5 (3) MG/3ML SOLN Take 3 mLs by nebulization every 6 (six) hours as needed (for shortness of breath/wheezing).    Yes Historical Provider, MD  lisinopril-hydrochlorothiazide (PRINZIDE,ZESTORETIC) 20-12.5 MG per tablet Take 1 tablet by mouth daily.   Yes Historical Provider, MD  PHENObarbital (LUMINAL) 30 MG tablet Take 30 mg by mouth 2 (two) times daily.   Yes Historical Provider, MD  phenytoin (DILANTIN) 100 MG ER capsule Take 100 mg by mouth 3 (three) times daily.   Yes Historical Provider, MD  folic acid (  FOLVITE) 1 MG tablet Take 1 tablet (1 mg total) by mouth daily. 08/02/16   [name], MD  omeprazole (PRILOSEC) 20 MG capsule Take 1 capsule (20 mg total) by mouth 2 (two) times daily before a meal. Patient taking differently: Take 20 mg by mouth daily.  11/25/15   [name], PA-C  solifenacin (VESICARE) 5 MG tablet Take 5 mg by mouth daily.    Historical Provider, MD   thiamine 100 MG tablet Take 1 tablet (100 mg total) by mouth daily. 08/02/16   [name], MD    Current Facility-Administered Medications  Medication Dose Route Frequency Provider Last Rate Last Dose  . dextrose 5 % and 0.45 % NaCl with KCl 40 mEq/L infusion   Intravenous Continuous [name], MD      . ipratropium-albuterol (DUONEB) 0.5-2.5 (3) MG/3ML nebulizer solution 3 mL  3 mL Nebulization Q6H PRN Oswald Hillock, MD      . ondansetron (ZOFRAN) tablet 4 mg  4 mg Oral Q6H PRN Oswald Hillock, MD       Or  . ondansetron (ZOFRAN) injection 4 mg  4 mg Intravenous Q6H PRN Oswald Hillock, MD      . pantoprazole (PROTONIX) injection 40 mg  40 mg Intravenous Q12H Oswald Hillock, MD      . PHENObarbital (LUMINAL) injection 30 mg  30 mg Intravenous BID Oswald Hillock, MD   30 mg at 12/24/16 0140  . phenytoin (DILANTIN) injection 100 mg  100 mg Intravenous Q8H Oswald Hillock, MD   100 mg at 12/24/16 0140  . potassium chloride 10 mEq in 100 mL IVPB  10 mEq Intravenous Q1 Hr x 3 Oswald Hillock, MD        Allergies as of 12/23/2016  . (No Known Allergies)    Family History  Problem Relation Age of Onset  . Liver disease Sister     etoh  . Colon cancer Neg Hx     Social History   Social History  . Marital status: Single    Spouse name: N/A  . Number of children: 3  . Years of education: N/A   Occupational History  . disability Unemployed   Social History Main Topics  . Smoking status: Former Smoker    Packs/day: 0.04    Years: 9.00    Types: Cigarettes    Quit date: 07/28/2014  . Smokeless tobacco: Never Used  . Alcohol use Yes     Comment: pt tried to drink today but was able to keep it down  . Drug use: No  . Sexual activity: Not on file   Other Topics Concern  . Not on file   Social History Narrative  . No narrative on file    Review of Systems: General: Negative for anorexia, weight loss, fever, chills, fatigue, weakness. ENT: Negative for hoarseness, difficulty  swallowing. CV: Negative for chest pain, angina, palpitations, peripheral edema.  Respiratory: Negative for dyspnea at rest, cough, sputum, wheezing.  GI: See history of present illness. Endo: Negative for unusual weight change.  Heme: Negative for bruising or bleeding. Allergy: Negative for rash or hives.  Physical Exam: Vital signs in last 24 hours: Temp:  [98.7 F (37.1 C)-98.8 F (37.1 C)] 98.7 F (37.1 C) (03/09 0500) Pulse Rate:  [94-111] 94 (03/09 0500) Resp:  [14-23] 18 (03/09 0500) BP: (130-159)/(56-136) 137/59 (03/09 0500) SpO2:  [91 %-98 %] 91 % (03/09 0500) Weight:  [145 lb (65.8 kg)] 145 lb (  65.8 kg) (03/08 2154) Last BM Date: 12/23/16 General:   Alert,  Well-developed, well-nourished, pleasant and cooperative in NAD Head:  Normocephalic and atraumatic. Eyes:  Sclera clear, no icterus. Conjunctiva pink. Ears:  Normal auditory acuity. Neck:  Supple; no masses or thyromegaly. Lungs:  Clear throughout to auscultation. No wheezes, crackles, or rhonchi. No acute distress. Heart:  Regular rate and rhythm; no murmurs, clicks, rubs,  or gallops. Abdomen:  Soft, and nondistended. Minimal abdominal TTP. No masses, hepatosplenomegaly or hernias noted. Normal bowel sounds, without guarding, and without rebound.   Rectal:  Deferred.   Msk:  Symmetrical without gross deformities. Pulses:  Normal bilateral DP pulses noted. Extremities:  Without clubbing or edema. Neurologic:  Alert and  oriented x4;  grossly normal neurologically. Psych:  Alert and cooperative. Normal mood and affect.  Intake/Output from previous day: 03/08 0701 - 03/09 0700 In: 470 [I.V.:170; IV Piggyback:300] Out: -  Intake/Output this shift: No intake/output data recorded.  Lab Results:  Recent Labs  12/23/16 2240 12/24/16 0537  WBC 7.0 7.1  HGB 11.9* 11.4*  HCT 35.5* 33.8*  PLT 133* 122*   BMET  Recent Labs  12/23/16 2240 12/24/16 0019 12/24/16 0537  NA 134* 133* 134*  K 2.5* 2.3* 2.3*   CL 67* 73* 78*  CO2 >50* 47* 44*  GLUCOSE 115* 113* 118*  BUN 24* 24* 21*  CREATININE 2.23* 2.00* 1.62*  CALCIUM 8.6* 8.2* 8.5*   LFT  Recent Labs  12/23/16 2240 12/24/16 0537  PROT 8.5* 7.7  ALBUMIN 4.0 3.6  AST 67* 51*  ALT 29 24  ALKPHOS 110 97  BILITOT 1.0 1.4*   PT/INR  Recent Labs  12/23/16 2240  LABPROT 14.7  INR 1.15   Hepatitis Panel No results for input(s): HEPBSAG, HCVAB, HEPAIGM, HEPBIGM in the last 72 hours. C-Diff No results for input(s): CDIFFTOX in the last 72 hours.  Studies/Results: No results found.  Impression: 61 year old female with chronic alcohol abuse, history of peptic ulcer disease. Endoscopy in 2016 found severe erosive esophagitis in the setting of chronic alcohol use. She was treated with twice a day PPI. At her last office visit with our service on 11/25/2015 noted some nighttime heartburn but on PPI. Had "cut back "on alcohol consisting of 24 ounces of beer and sometimes wine. 67 episodes of unwitnessed coffee-ground emesis. No further bleeding since admission. No hematochezia or melena. Minimal abdominal pain on exam. No hemodynamically significant bleed as her hemoglobin is actually better than it typically is. At this point likely trivial upper GI bleed related to continued alcohol use, esophagitis. She is currently on twice a day PPI and we will continue this. She will likely benefit from twice a day PPI as an outpatient.  Plan: 1. Continue to monitor for any obvious GI bleed 2. Transfuse as necessary 3. No indication for urgent procedure at this time. 4. Monitor hemoglobin 5. Continue twice a day PPI in the hospital as well as at discharge. 6. GI follow-up on discharge. 7. Supportive measures   Thank you for allowing [place] to participate in the care of [place], DNP, AGNP-C Adult & Gerontological Nurse Practitioner [facility]    LOS: 0 days     12/24/2016, 9:23 AM

## 2016-12-24 NOTE — Care Management Note (Signed)
Case Management Note  Patient Details  Name: [name] MRN: [id number] Date of Birth: [birth year]  Subjective/Objective:                  Admitted with gib. Pt from home, ind with adl's and has aid. Pt has pcp and transportation to appointments. She has no difficulty affording or managing medications. She plans to return home at [place]. She has no HH needs pta. She has all necessary DME pta.   Action/Plan: No CM needs anticipated.   Expected Discharge Date:    12/25/2016              Expected Discharge Plan:  Home/Self Care  In-House Referral:  NA  Discharge planning Services  CM Consult  Post Acute Care Choice:  NA Choice offered to:  NA  Status of Service:  Completed, signed off  Sherald Barge, RN 12/24/2016, 11:19 AM

## 2016-12-25 DIAGNOSIS — K922 Gastrointestinal hemorrhage, unspecified: Secondary | ICD-10-CM | POA: Diagnosis not present

## 2016-12-25 LAB — CBC
HCT: 31.4 % — ABNORMAL LOW (ref 36.0–46.0)
Hemoglobin: 10.6 g/dL — ABNORMAL LOW (ref 12.0–15.0)
MCH: 31.6 pg (ref 26.0–34.0)
MCHC: 33.8 g/dL (ref 30.0–36.0)
MCV: 93.7 fL (ref 78.0–100.0)
Platelets: 115 10*3/uL — ABNORMAL LOW (ref 150–400)
RBC: 3.35 MIL/uL — ABNORMAL LOW (ref 3.87–5.11)
RDW: 13.6 % (ref 11.5–15.5)
WBC: 5.3 10*3/uL (ref 4.0–10.5)

## 2016-12-25 LAB — BASIC METABOLIC PANEL
Anion gap: 8 (ref 5–15)
BUN: 14 mg/dL (ref 6–20)
CO2: 34 mmol/L — ABNORMAL HIGH (ref 22–32)
Calcium: 8.5 mg/dL — ABNORMAL LOW (ref 8.9–10.3)
Chloride: 92 mmol/L — ABNORMAL LOW (ref 101–111)
Creatinine, Ser: 0.84 mg/dL (ref 0.44–1.00)
GFR calc Af Amer: >60 mL/min (ref >60)
GFR calc non Af Amer: >60 mL/min (ref >60)
Glucose, Bld: 112 mg/dL — ABNORMAL HIGH (ref 65–99)
Potassium: 3 mmol/L — ABNORMAL LOW (ref 3.5–5.1)
Sodium: 134 mmol/L — ABNORMAL LOW (ref 135–145)

## 2016-12-25 LAB — HIV ANTIBODY (ROUTINE TESTING W REFLEX): HIV Screen 4th Generation wRfx: NONREACTIVE

## 2016-12-25 MED ORDER — POTASSIUM CHLORIDE CRYS ER 20 MEQ PO TBCR
40.0000 meq | EXTENDED_RELEASE_TABLET | Freq: Two times a day (BID) | ORAL | Status: DC
  Administered 2016-12-25 (×2): 40 meq via ORAL
  Filled 2016-12-25: qty 2
  Filled 2016-12-25: qty 4

## 2016-12-25 MED ORDER — POTASSIUM CHLORIDE 10 MEQ/100ML IV SOLN
10.0000 meq | INTRAVENOUS | Status: AC
  Administered 2016-12-25 (×3): 10 meq via INTRAVENOUS

## 2016-12-25 MED ORDER — SODIUM CHLORIDE 0.9 % IV SOLN: 30.0000 meq | INTRAVENOUS | Status: DC

## 2016-12-25 MED ORDER — POTASSIUM CHLORIDE 10 MEQ/100ML IV SOLN
INTRAVENOUS | Status: AC
  Filled 2016-12-25: qty 100

## 2016-12-25 MED ORDER — PANTOPRAZOLE SODIUM 40 MG PO TBEC
40.0000 mg | DELAYED_RELEASE_TABLET | Freq: Two times a day (BID) | ORAL | Status: DC
  Administered 2016-12-25: 40 mg via ORAL
  Filled 2016-12-25: qty 1

## 2016-12-25 NOTE — Progress Notes (Signed)
Patient ID: [name], female   DOB: 1956-10-06, 61 y.o.   MRN: [id number]    Assessment/Plan: ADMITTED WITH COFFEE GROUND EMESIS. CLINICALLY IMPROVED. Hb STABLE.  Plan: 1. PROTONIX BID 2. ADVANCE DIET 3. NO INDICATION FOR ENDOSCOPY AT THIS TIME.     Subjective: Since I last evaluated the patient SHE IS TOLERATING HER DIET. NO NAUSEA OR VOMITING. MILD EPIGASTRIC PAIN. NO BRBPR OR MELENA.   Objective: Vital signs in last 24 hours: Vitals:   12/24/16 2020 12/25/16 0449  BP: (!) 144/70 102/88  Pulse: 93 95  Resp: 18 18  Temp: 99 F (37.2 C) 98.7 F (37.1 C)   General appearance: alert, cooperative and no distress Resp: clear to auscultation bilaterally Cardio: regular rate and rhythm GI: soft, MILDLY tender IN THE EPIGASTRIUM; bowel sounds normal;   Lab Results: K 3.0 Hb 10.6 PLT CT 115   Studies/Results: No results found.  Medications: I have reviewed the patient's current medications.   LOS: 5 days   Barney Drain 03/28/2014, 2:23 PM

## 2016-12-25 NOTE — Progress Notes (Signed)
Subjective: Patient feels better. No hematemesis. Her K+ is improving.  Objective: Vital signs in last 24 hours: Temp:  [98.7 F (37.1 C)-99 F (37.2 C)] 98.7 F (37.1 C) (03/10 0449) Pulse Rate:  [93-95] 95 (03/10 0449) Resp:  [18] 18 (03/10 0449) BP: (102-144)/(70-88) 102/88 (03/10 0449) SpO2:  [96 %-99 %] 97 % (03/10 0449) Weight change:  Last BM Date: 12/21/16  Intake/Output from previous day: 03/09 0701 - 03/10 0700 In: 1707.5 [P.O.:480; I.V.:1227.5] Out: -   PHYSICAL EXAM General appearance: alert and no distress Resp: clear to auscultation bilaterally Cardio: S1, S2 normal GI: soft, non-tender; bowel sounds normal; no masses,  no organomegaly Extremities: extremities normal, atraumatic, no cyanosis or edema  Lab Results:  Results for orders placed or performed during the hospital encounter of 12/23/16 (from the past 48 hour(s))  CBC with Differential     Status: Abnormal   Collection Time: 12/23/16 10:40 PM  Result Value Ref Range   WBC 7.0 4.0 - 10.5 K/uL   RBC 3.76 (L) 3.87 - 5.11 MIL/uL   Hemoglobin 11.9 (L) 12.0 - 15.0 g/dL   HCT 35.5 (L) 36.0 - 46.0 %   MCV 94.4 78.0 - 100.0 fL   MCH 31.6 26.0 - 34.0 pg   MCHC 33.5 30.0 - 36.0 g/dL   RDW 13.8 11.5 - 15.5 %   Platelets 133 (L) 150 - 400 K/uL   Neutrophils Relative % 70 %   Neutro Abs 4.9 1.7 - 7.7 K/uL   Lymphocytes Relative 16 %   Lymphs Abs 1.1 0.7 - 4.0 K/uL   Monocytes Relative 14 %   Monocytes Absolute 1.0 0.1 - 1.0 K/uL   Eosinophils Relative 0 %   Eosinophils Absolute 0.0 0.0 - 0.7 K/uL   Basophils Relative 0 %   Basophils Absolute 0.0 0.0 - 0.1 K/uL  Protime-INR     Status: None   Collection Time: 12/23/16 10:40 PM  Result Value Ref Range   Prothrombin Time 14.7 11.4 - 15.2 seconds   INR 1.15   Comprehensive metabolic panel     Status: Abnormal   Collection Time: 12/23/16 10:40 PM  Result Value Ref Range   Sodium 134 (L) 135 - 145 mmol/L   Potassium 2.5 (LL) 3.5 - 5.1 mmol/L   Comment: CRITICAL RESULT CALLED TO, READ BACK BY AND VERIFIED WITH: WINNINGHAM,C AT 2340 ON 12/23/2016 BY ISLEY,B    Chloride 67 (L) 101 - 111 mmol/L   CO2 >50 (H) 22 - 32 mmol/L   Glucose, Bld 115 (H) 65 - 99 mg/dL   BUN 24 (H) 6 - 20 mg/dL   Creatinine, Ser 2.23 (H) 0.44 - 1.00 mg/dL   Calcium 8.6 (L) 8.9 - 10.3 mg/dL   Total Protein 8.5 (H) 6.5 - 8.1 g/dL   Albumin 4.0 3.5 - 5.0 g/dL   AST 67 (H) 15 - 41 U/L   ALT 29 14 - 54 U/L   Alkaline Phosphatase 110 38 - 126 U/L   Total Bilirubin 1.0 0.3 - 1.2 mg/dL   GFR calc non Af Amer 23 (L) >60 mL/min   GFR calc Af Amer 27 (L) >60 mL/min    Comment: (NOTE) The eGFR has been calculated using the CKD EPI equation. This calculation has not been validated in all clinical situations. eGFR's persistently <60 mL/min signify possible Chronic Kidney Disease.   Ethanol     Status: None   Collection Time: 12/23/16 10:50 PM  Result Value Ref Range   Alcohol, Ethyl (  B) <5 <5 mg/dL    Comment:        LOWEST DETECTABLE LIMIT FOR SERUM ALCOHOL IS 5 mg/dL FOR MEDICAL PURPOSES ONLY   Type and screen [facility]     Status: None   Collection Time: 12/23/16 10:50 PM  Result Value Ref Range   ABO/RH(D) O POS    Antibody Screen NEG    Sample Expiration 73/53/2992   Basic metabolic panel     Status: Abnormal   Collection Time: 12/24/16 12:19 AM  Result Value Ref Range   Sodium 133 (L) 135 - 145 mmol/L   Potassium 2.3 (LL) 3.5 - 5.1 mmol/L    Comment: CRITICAL RESULT CALLED TO, READ BACK BY AND VERIFIED WITH: BULOCK,T AT 0105 ON 12/24/2016 BY ISLEY,B    Chloride 73 (L) 101 - 111 mmol/L   CO2 47 (H) 22 - 32 mmol/L   Glucose, Bld 113 (H) 65 - 99 mg/dL   BUN 24 (H) 6 - 20 mg/dL   Creatinine, Ser 2.00 (H) 0.44 - 1.00 mg/dL   Calcium 8.2 (L) 8.9 - 10.3 mg/dL   GFR calc non Af Amer 26 (L) >60 mL/min   GFR calc Af Amer 30 (L) >60 mL/min    Comment: (NOTE) The eGFR has been calculated using the CKD EPI equation. This calculation has not been  validated in all clinical situations. eGFR's persistently <60 mL/min signify possible Chronic Kidney Disease.    Anion gap 13 5 - 15  Urinalysis, Routine w reflex microscopic     Status: Abnormal   Collection Time: 12/24/16 12:19 AM  Result Value Ref Range   Color, Urine YELLOW YELLOW   APPearance CLEAR CLEAR   Specific Gravity, Urine 1.012 1.005 - 1.030   pH 8.0 5.0 - 8.0   Glucose, UA NEGATIVE NEGATIVE mg/dL   Hgb urine dipstick NEGATIVE NEGATIVE   Bilirubin Urine NEGATIVE NEGATIVE   Ketones, ur NEGATIVE NEGATIVE mg/dL   Protein, ur 100 (A) NEGATIVE mg/dL   Nitrite NEGATIVE NEGATIVE   Leukocytes, UA NEGATIVE NEGATIVE   RBC / HPF 0-5 0 - 5 RBC/hpf   WBC, UA 0-5 0 - 5 WBC/hpf   Bacteria, UA NONE SEEN NONE SEEN   Mucous PRESENT    Hyaline Casts, UA PRESENT   HIV antibody (Routine Testing)     Status: None   Collection Time: 12/24/16 12:19 AM  Result Value Ref Range   HIV Screen 4th Generation wRfx Non Reactive Non Reactive    Comment: (NOTE) Performed At: [facility] [place], [place] [id number] Lindon Romp MD QI:[id number]   CBC     Status: Abnormal   Collection Time: 12/24/16  5:37 AM  Result Value Ref Range   WBC 7.1 4.0 - 10.5 K/uL   RBC 3.62 (L) 3.87 - 5.11 MIL/uL   Hemoglobin 11.4 (L) 12.0 - 15.0 g/dL   HCT 33.8 (L) 36.0 - 46.0 %   MCV 93.4 78.0 - 100.0 fL   MCH 31.5 26.0 - 34.0 pg   MCHC 33.7 30.0 - 36.0 g/dL   RDW 13.8 11.5 - 15.5 %   Platelets 122 (L) 150 - 400 K/uL  Comprehensive metabolic panel     Status: Abnormal   Collection Time: 12/24/16  5:37 AM  Result Value Ref Range   Sodium 134 (L) 135 - 145 mmol/L   Potassium 2.3 (LL) 3.5 - 5.1 mmol/L    Comment: CRITICAL RESULT CALLED TO, READ BACK BY AND VERIFIED WITH: BULLOCK,T  AT 6:25AM ON 12/24/16 BY FESTERMAN,C    Chloride 78 (L) 101 - 111 mmol/L   CO2 44 (H) 22 - 32 mmol/L   Glucose, Bld 118 (H) 65 - 99 mg/dL   BUN 21 (H) 6 - 20 mg/dL   Creatinine, Ser 1.62 (H) 0.44 -  1.00 mg/dL   Calcium 8.5 (L) 8.9 - 10.3 mg/dL   Total Protein 7.7 6.5 - 8.1 g/dL   Albumin 3.6 3.5 - 5.0 g/dL   AST 51 (H) 15 - 41 U/L   ALT 24 14 - 54 U/L   Alkaline Phosphatase 97 38 - 126 U/L   Total Bilirubin 1.4 (H) 0.3 - 1.2 mg/dL   GFR calc non Af Amer 34 (L) >60 mL/min   GFR calc Af Amer 39 (L) >60 mL/min    Comment: (NOTE) The eGFR has been calculated using the CKD EPI equation. This calculation has not been validated in all clinical situations. eGFR's persistently <60 mL/min signify possible Chronic Kidney Disease.    Anion gap 12 5 - 15  Magnesium     Status: None   Collection Time: 12/24/16  5:37 AM  Result Value Ref Range   Magnesium 2.3 1.7 - 2.4 mg/dL  Basic metabolic panel     Status: Abnormal   Collection Time: 12/25/16  4:26 AM  Result Value Ref Range   Sodium 134 (L) 135 - 145 mmol/L   Potassium 3.0 (L) 3.5 - 5.1 mmol/L    Comment: DELTA CHECK NOTED   Chloride 92 (L) 101 - 111 mmol/L   CO2 34 (H) 22 - 32 mmol/L   Glucose, Bld 112 (H) 65 - 99 mg/dL   BUN 14 6 - 20 mg/dL   Creatinine, Ser 0.84 0.44 - 1.00 mg/dL   Calcium 8.5 (L) 8.9 - 10.3 mg/dL   GFR calc non Af Amer >60 >60 mL/min   GFR calc Af Amer >60 >60 mL/min    Comment: (NOTE) The eGFR has been calculated using the CKD EPI equation. This calculation has not been validated in all clinical situations. eGFR's persistently <60 mL/min signify possible Chronic Kidney Disease.    Anion gap 8 5 - 15  CBC     Status: Abnormal   Collection Time: 12/25/16  4:26 AM  Result Value Ref Range   WBC 5.3 4.0 - 10.5 K/uL   RBC 3.35 (L) 3.87 - 5.11 MIL/uL   Hemoglobin 10.6 (L) 12.0 - 15.0 g/dL   HCT 31.4 (L) 36.0 - 46.0 %   MCV 93.7 78.0 - 100.0 fL   MCH 31.6 26.0 - 34.0 pg   MCHC 33.8 30.0 - 36.0 g/dL   RDW 13.6 11.5 - 15.5 %   Platelets 115 (L) 150 - 400 K/uL    Comment: SPECIMEN CHECKED FOR CLOTS PLATELET COUNT CONFIRMED BY SMEAR     ABGS No results for input(s): PHART, PO2ART, TCO2, HCO3 in the  last 72 hours.  Invalid input(s): PCO2 CULTURES No results found for this or any previous visit (from the past 240 hour(s)). Studies/Results: No results found.  Medications: I have reviewed the patient's current medications.  Assesment:  Active Problems:   STROKE-With also h/o Aneurysm clipping?   Seizure disorder (HCC)   UGI bleed   Erosive esophagitis hypokalemia Acute kidney injury   Plan:  Medications reviewed Will continue PPI Will continue to replace K+ Will monitor CBC/BMP GI consult appreciated.    LOS: 0 days   Vernica Wachtel 12/25/2016, 9:35 AM

## 2016-12-26 ENCOUNTER — Telehealth: Payer: Self-pay | Admitting: Gastroenterology

## 2016-12-26 DIAGNOSIS — K922 Gastrointestinal hemorrhage, unspecified: Secondary | ICD-10-CM | POA: Diagnosis not present

## 2016-12-26 DIAGNOSIS — R740 Nonspecific elevation of levels of transaminase and lactic acid dehydrogenase [LDH]: Principal | ICD-10-CM

## 2016-12-26 DIAGNOSIS — R7401 Elevation of levels of liver transaminase levels: Secondary | ICD-10-CM

## 2016-12-26 LAB — BASIC METABOLIC PANEL
Anion gap: 6 (ref 5–15)
BUN: 10 mg/dL (ref 6–20)
CO2: 28 mmol/L (ref 22–32)
Calcium: 8.9 mg/dL (ref 8.9–10.3)
Chloride: 101 mmol/L (ref 101–111)
Creatinine, Ser: 0.62 mg/dL (ref 0.44–1.00)
GFR calc Af Amer: >60 mL/min (ref >60)
GFR calc non Af Amer: >60 mL/min (ref >60)
Glucose, Bld: 115 mg/dL — ABNORMAL HIGH (ref 65–99)
Potassium: 4.5 mmol/L (ref 3.5–5.1)
Sodium: 135 mmol/L (ref 135–145)

## 2016-12-26 LAB — CBC
HCT: 33.6 % — ABNORMAL LOW (ref 36.0–46.0)
Hemoglobin: 11.3 g/dL — ABNORMAL LOW (ref 12.0–15.0)
MCH: 31.7 pg (ref 26.0–34.0)
MCHC: 33.6 g/dL (ref 30.0–36.0)
MCV: 94.1 fL (ref 78.0–100.0)
Platelets: 124 10*3/uL — ABNORMAL LOW (ref 150–400)
RBC: 3.57 MIL/uL — ABNORMAL LOW (ref 3.87–5.11)
RDW: 13.5 % (ref 11.5–15.5)
WBC: 6.2 10*3/uL (ref 4.0–10.5)

## 2016-12-26 MED ORDER — PANTOPRAZOLE SODIUM 40 MG PO TBEC
40.0000 mg | DELAYED_RELEASE_TABLET | Freq: Two times a day (BID) | ORAL | 3 refills | Status: DC
Start: 2016-12-26 — End: 2018-06-23

## 2016-12-26 MED ORDER — KCL IN DEXTROSE-NACL 40-5-0.45 MEQ/L-%-% IV SOLN: INTRAVENOUS | Status: DC

## 2016-12-26 MED ORDER — POTASSIUM CHLORIDE CRYS ER 20 MEQ PO TBCR: 40.0000 meq | EXTENDED_RELEASE_TABLET | Freq: Two times a day (BID) | ORAL | Status: DC

## 2016-12-26 NOTE — Progress Notes (Signed)
Patient ID: [name], female   DOB: Oct 02, 1956, 61 y.o.   MRN: [id number]   Assessment/Plan: Admitted with coffee ground emesis. SYMPTOMS CONTROLLED/RESOLVED. Persistent low plt ct-HEP C AB NEG(2016)  Plan: 1. D/c ON BID PPI 2. OPV IN 2-3 MOS WITH DR. Kyo Cocuzza 3. Need ruq [place] to assess for cirrhsosi as an outpatent within the next 3-4 weeks.   Subjective: Since I last evaluated the patient SHE IS TOLERATING HER DIET. NO BRBPR OR MELENA. ABDOMINAL PAIN IS RESOLVED. NO NAUSEA OR VOMITING. CONTINUES TO DRINK ETOH. SHARES A 12 PK/WEEK WITH HER FRIEND.   Objective: Vital signs in last 24 hours: Vitals:   12/25/16 2158 12/26/16 0610  BP: (!) 150/71 133/63  Pulse: 98 87  Resp: 18 18  Temp: 97.8 F (36.6 C) 98.3 F (36.8 C)   General appearance: alert, cooperative and no distress Heent: poor dentition GI: soft, non-tender; bowel sounds normal;   Lab Results:  K 4.5 PLT CT 115 Hb 10.6-11.4 Mar 9: AST 51 ALT 24 ALB 3.6 T BILI  1.4  Studies/Results: No results found.  Medications: I have reviewed the patient's current medications.   LOS: 5 days   Barney Drain 03/28/2014, 2:23 PM

## 2016-12-26 NOTE — Telephone Encounter (Signed)
PT NEED RUQ [place] WITHIN 2 WEEKS, DX: THROMBOCYTOPENIA/ETOH ABUSE AND AN OPV IN 2-3 MOS, Dx: ETOH ABUSE, THROMBOCYTOPENIA, ANEMIA.

## 2016-12-26 NOTE — Discharge Summary (Signed)
Physician Discharge Summary  Patient ID: [name] MRN: [id number] DOB/AGE: [birth year] 61 y.o. Primary Care Physician:Lovelace Cerveny, MD Admit date: 12/23/2016 Discharge date: 12/26/2016    Discharge Diagnoses:    Active Problems:   STROKE-With also h/o Aneurysm clipping?   Seizure disorder (HCC)   UGI bleed   Erosive esophagitis hypokalemia  Allergies as of 12/26/2016   No Known Allergies     Medication List    STOP taking these medications   aspirin 81 MG EC tablet   omeprazole 20 MG capsule Commonly known as:  PRILOSEC     TAKE these medications   albuterol 108 (90 Base) MCG/ACT inhaler Commonly known as:  PROVENTIL HFA;VENTOLIN HFA Inhale 2 puffs into the lungs every 6 (six) hours as needed for wheezing or shortness of breath.   folic acid 1 MG tablet Commonly known as:  FOLVITE Take 1 tablet (1 mg total) by mouth daily.   HYDROcodone-acetaminophen 5-325 MG tablet Commonly known as:  NORCO/VICODIN Take 1 tablet by mouth every 6 (six) hours as needed for moderate pain.   ipratropium-albuterol 0.5-2.5 (3) MG/3ML Soln Commonly known as:  DUONEB Take 3 mLs by nebulization every 6 (six) hours as needed (for shortness of breath/wheezing).   lisinopril-hydrochlorothiazide 20-12.5 MG tablet Commonly known as:  PRINZIDE,ZESTORETIC Take 1 tablet by mouth daily.   pantoprazole 40 MG tablet Commonly known as:  PROTONIX Take 1 tablet (40 mg total) by mouth 2 (two) times daily before a meal.   PHENObarbital 30 MG tablet Commonly known as:  LUMINAL Take 30 mg by mouth 2 (two) times daily.   phenytoin 100 MG ER capsule Commonly known as:  DILANTIN Take 100 mg by mouth 3 (three) times daily.   solifenacin 5 MG tablet Commonly known as:  VESICARE Take 5 mg by mouth daily.   thiamine 100 MG tablet Take 1 tablet (100 mg total) by mouth daily.       Discharged Condition: improved    Consults: GI  Significant Diagnostic Studies: Ct Renal Stone  Study  Result Date: 12/12/2016 CLINICAL DATA:  Acute onset of left flank and abdominal pain. Seizure. Initial encounter. EXAM: CT ABDOMEN AND PELVIS WITHOUT CONTRAST TECHNIQUE: Multidetector CT imaging of the abdomen and pelvis was performed following the standard protocol without IV contrast. COMPARISON:  Abdominal ultrasound performed 10/15/2015, and renal ultrasound performed 07/31/2016 FINDINGS: Lower chest: Mild bibasilar atelectasis is noted, somewhat nodular at the left lung base. The visualized portions of the mediastinum are unremarkable. Hepatobiliary: The nodular contour of the liver is compatible with hepatic cirrhosis. No dominant hepatic mass is seen. The gallbladder is grossly unremarkable in appearance. The common bile duct remains normal in caliber. Pancreas: The pancreas is within normal limits. Spleen: The spleen is unremarkable in appearance. Adrenals/Urinary Tract: The adrenal glands are unremarkable in appearance. The kidneys are within normal limits. Mild bilateral renal pelvicaliectasis remains within normal limits. There is no evidence of hydronephrosis. No renal or ureteral stones are identified. No perinephric stranding is seen. Stomach/Bowel: The stomach is unremarkable in appearance. The small bowel is within normal limits. The appendix is normal in caliber, without evidence of appendicitis. Scattered diverticulosis is noted along the descending and proximal sigmoid colon, without evidence of diverticulitis. Vascular/Lymphatic: Minimal calcification is seen at the distal abdominal aorta. The visualized vasculature is otherwise unremarkable, though not well assessed without contrast. No retroperitoneal or pelvic sidewall lymphadenopathy is seen. Reproductive: The bladder is mildly distended and grossly unremarkable. The patient is status post hysterectomy. No suspicious  adnexal masses are seen. Other: No additional soft tissue abnormalities are seen. Musculoskeletal: No acute osseous  abnormalities are identified. Prominent osteophyte formation is noted about the left hip joint. Facet disease is noted along the lower lumbar spine. The visualized musculature is unremarkable in appearance. IMPRESSION: 1. No acute abnormality seen to explain the patient's symptoms. 2. Findings of hepatic cirrhosis. 3. Scattered diverticulosis along the descending and proximal sigmoid colon, without evidence of diverticulitis. 4. Mild bibasilar atelectasis, somewhat nodular at the left lung base. Electronically Signed   By: Roanna Raider M.D.   On: 12/12/2016 21:21    Lab Results: Basic Metabolic Panel:  Recent Labs  11/91/47 0537 12/25/16 0426 12/26/16 0547  NA 134* 134* 135  K 2.3* 3.0* 4.5  CL 78* 92* 101  CO2 44* 34* 28  GLUCOSE 118* 112* 115*  BUN 21* 14 10  CREATININE 1.62* 0.84 0.62  CALCIUM 8.5* 8.5* 8.9  MG 2.3  --   --    Liver Function Tests:  Recent Labs  12/23/16 2240 12/24/16 0537  AST 67* 51*  ALT 29 24  ALKPHOS 110 97  BILITOT 1.0 1.4*  PROT 8.5* 7.7  ALBUMIN 4.0 3.6     CBC:  Recent Labs  12/23/16 2240  12/25/16 0426 12/26/16 0547  WBC 7.0  < > 5.3 6.2  NEUTROABS 4.9  --   --   --   HGB 11.9*  < > 10.6* 11.3*  HCT 35.5*  < > 31.4* 33.6*  MCV 94.4  < > 93.7 94.1  PLT 133*  < > 115* 124*  < > = values in this interval not displayed.  No results found for this or any previous visit (from the past 240 hour(s)).   Hospital Course:   This is a 61 years old female with history of multiple medical illnesses was admitted due to hematemeses and severe hypokalemia. Her K+ was supplemented. Gi consult was done and patient was treated with PPI. There was no drop in H/H. Her K+ was corrected. Her hematemesis improved. Patient advised to avoid alcohol. She was discharged in stable condition.  Discharge Exam: Blood pressure 133/63, pulse 87, temperature 98.3 F (36.8 C), temperature source Oral, resp. rate 18, height 5\' 4"  (1.626 m), weight 65.8 kg (145 lb),  SpO2 99 %.    Disposition:  home    Follow-up Information    Ellery Tash, MD Follow up in 1 week(s).   Specialty:  Internal Medicine Contact information: [address] [place] [address] [phone]           Signed: Avon Gully   12/26/2016, 10:51 AM

## 2016-12-26 NOTE — Progress Notes (Signed)
Patient discharged with instructions, prescription, and care notes.  Verbalized understanding via teach back.  IV was removed and the site was WNL. Patient voiced no further complaints or concerns at the time of discharge.  Appointments scheduled per instructions.  Patient left the floor via w/c family  And staff in stable condition. 

## 2016-12-27 ENCOUNTER — Encounter: Payer: Self-pay | Admitting: Gastroenterology

## 2016-12-27 NOTE — Telephone Encounter (Signed)
Pt is set up for [place] on 12/31/16 @ 8:30 am . Letter has been mailed

## 2016-12-27 NOTE — Telephone Encounter (Signed)
APPT MADE AND LETTER SENT  °

## 2016-12-31 ENCOUNTER — Ambulatory Visit (HOSPITAL_COMMUNITY): Admission: RE | Admit: 2016-12-31 | Payer: Medicare Other | Source: Ambulatory Visit

## 2017-01-18 ENCOUNTER — Ambulatory Visit: Payer: Medicare Other | Admitting: Orthopaedic Surgery

## 2017-01-25 ENCOUNTER — Encounter: Payer: Self-pay | Admitting: Orthopaedic Surgery

## 2017-01-25 ENCOUNTER — Ambulatory Visit (INDEPENDENT_AMBULATORY_CARE_PROVIDER_SITE_OTHER): Payer: Medicare Other | Admitting: Orthopaedic Surgery

## 2017-01-25 VITALS — BP 173/84 | HR 88 | Temp 97.3°F | Ht 64.0 in | Wt 148.0 lb

## 2017-01-25 DIAGNOSIS — M25511 Pain in right shoulder: Secondary | ICD-10-CM | POA: Diagnosis not present

## 2017-01-25 DIAGNOSIS — G8929 Other chronic pain: Secondary | ICD-10-CM | POA: Diagnosis not present

## 2017-01-25 MED ORDER — HYDROCODONE-ACETAMINOPHEN 5-325 MG PO TABS: 1.0000 | ORAL_TABLET | ORAL | 0 refills | Status: DC | PRN

## 2017-01-25 NOTE — Progress Notes (Signed)
Patient [name], female DOB:1956-07-07, 61 y.o. NOM:[id number]  Chief Complaint  Patient presents with  . Follow-up    Right Shoulder    HPI  [name] is a 61 y.o. female who has chronic pain of the right shoulder.  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 She has no new trauma,.  She is taking her medicine and doing her exercises. HPI  Body mass index is 25.4 kg/m.  ROS  Review of Systems  Constitutional:       Patient does not have Diabetes Mellitus. Patient has hypertension. Patient has COPD or shortness of breath. Patient does not have BMI > 35. Patient does not have current smoking history.  Respiratory: Negative for shortness of breath.   Cardiovascular: Negative for chest pain.  Endocrine: Positive for cold intolerance.  Musculoskeletal: Positive for arthralgias, gait problem, myalgias and neck pain.  Allergic/Immunologic: Positive for environmental allergies.  Neurological: Positive for weakness (right side) and headaches.    Past Medical History:  Diagnosis Date  . Alcohol abuse   . Chronic abdominal pain   . Chronic diarrhea   . Contracture of muscle of hand    right  . HTN (hypertension)   . PUD (peptic ulcer disease)    remote  . Seizures ([place])    since stroke, no recent seizures  . Shortness of breath    with exertion  . Stroke [facility])    age 89, required brain surgery    Past Surgical History:  Procedure Laterality Date  . ABDOMINAL HYSTERECTOMY  2004   complete  . BRAIN SURGERY     age 20, stroke  . COLONOSCOPY    . COLONOSCOPY WITH PROPOFOL  10/03/2012   Dr. [name]: moderate diverticulosis, small internal hemorrhoids, TUBULAR ADENOMA. Next colonoscopy 2023 per Dr. [name].  . ESOPHAGOGASTRODUODENOSCOPY N/A 10/18/2014   Dr. Gala Romney during inpatient hospitalization: severe exudative/erosive reflux esophagitis as source of trivial upper GI bleed. No varices   . ESOPHAGOGASTRODUODENOSCOPY (EGD) WITH PROPOFOL  10/03/2012   Dr.  [name]: stricture at [place] junction s/p dilation, mild gastritis negative H.pylori   . right tib-fib fracture  2008  . SAVORY DILATION  10/03/2012   Procedure: SAVORY DILATION;  Surgeon: Danie Binder, MD;  Location: [facility];  Service: Endoscopy;  Laterality: N/A;  started at 1303,  dilated 12.8-16mm    Family History  Problem Relation Age of Onset  . Liver disease Sister     etoh  . Colon cancer Neg Hx     Social History Social History  Substance Use Topics  . Smoking status: Former Smoker    Packs/day: 0.04    Years: 9.00    Types: Cigarettes    Quit date: 07/28/2014  . Smokeless tobacco: Never Used  . Alcohol use Yes     Comment: pt tried to drink today but was able to keep it down    No Known Allergies  Current Outpatient Prescriptions  Medication Sig Dispense Refill  . albuterol (PROVENTIL HFA;VENTOLIN HFA) 108 (90 BASE) MCG/ACT inhaler Inhale 2 puffs into the lungs every 6 (six) hours as needed for wheezing or shortness of breath.     . folic acid (FOLVITE) 1 MG tablet Take 1 tablet (1 mg total) by mouth daily. 30 tablet 3  . HYDROcodone-acetaminophen (NORCO/VICODIN) 5-325 MG tablet Take 1 tablet by mouth every 4 (four) hours as needed for moderate pain (Must last 14 days.Do not take and drive a car or use machinery.). 56 tablet 0  .  ipratropium-albuterol (DUONEB) 0.5-2.5 (3) MG/3ML SOLN Take 3 mLs by nebulization every 6 (six) hours as needed (for shortness of breath/wheezing).     Marland Kitchen lisinopril-hydrochlorothiazide (PRINZIDE,ZESTORETIC) 20-12.5 MG per tablet Take 1 tablet by mouth daily.    . pantoprazole (PROTONIX) 40 MG tablet Take 1 tablet (40 mg total) by mouth 2 (two) times daily before a meal. 60 tablet 3  . PHENObarbital (LUMINAL) 30 MG tablet Take 30 mg by mouth 2 (two) times daily.    . phenytoin (DILANTIN) 100 MG ER capsule Take 100 mg by mouth 3 (three) times daily.    . solifenacin (VESICARE) 5 MG tablet Take 5 mg by mouth daily.    Marland Kitchen thiamine 100 MG tablet  Take 1 tablet (100 mg total) by mouth daily. 30 tablet 3   No current facility-administered medications for this visit.      Physical Exam  Blood pressure (!) 173/84, pulse 88, temperature 97.3 F (36.3 C), height 5\' 4"  (1.626 m), weight 148 lb (67.1 kg).  Constitutional: overall normal hygiene, normal nutrition, well developed, normal grooming, normal body habitus. Assistive device:none  Musculoskeletal: gait and station Limp right, muscle tone and strength are normal on left, weakness and atrophy right, no tremors  is present.  .  Neurological: coordination limp to the right secondary to old right sided CVA with deformity and atrophy right side.  Sensation normal.  Cranial nerves II-XII intact.   Skin:   Normal overall no scars, lesions, ulcers or rashes. No psoriasis.  Psychiatric: Alert and oriented x 3.  Recent memory intact, remote memory unclear.  Normal mood and affect. Well groomed.  Good eye contact.  Cardiovascular: overall no swelling, no varicosities, no edema bilaterally, normal temperatures of the legs and arms, no clubbing, cyanosis and good capillary refill.  Lymphatic: palpation is normal.  Examination of right Upper Extremity is done.  Inspection:   Overall:  Elbow non-tender without crepitus or defects, forearm non-tender without crepitus or defects, wrist non-tender without crepitus or defects, hand non-tender.    Shoulder: with glenohumeral joint tenderness, without effusion.   Upper arm: without swelling and tenderness   Range of motion:   Overall:  Full range of motion of the elbow, full range of motion of wrist and full range of motion in fingers.   Shoulder:  right  90 degrees forward flexion; 75 degrees abduction; 20 degrees internal rotation, 20 degrees external rotation, 10 degrees extension, 35 degrees adduction.   Stability:   Overall:  Shoulder, elbow and wrist stable   Strength and Tone:   Overall full shoulder muscles strength, full upper arm  strength and normal upper arm bulk and tone.    The patient has been educated about the nature of the problem(s) and counseled on treatment options.  The patient appeared to understand what I have discussed and is in agreement with it.  Encounter Diagnosis  Name Primary?  . Chronic right shoulder pain Yes    PLAN Call if any problems.  Precautions discussed.  Continue current medications.   Return to clinic 3 months   I have reviewed the [place] web site prior to prescribing narcotic medicine for this patient.  Electronically Signed Sanjuana Kava, MD 4/10/201811:36 AM

## 2017-03-01 ENCOUNTER — Telehealth: Payer: Self-pay | Admitting: Orthopaedic Surgery

## 2017-03-01 MED ORDER — HYDROCODONE-ACETAMINOPHEN 5-325 MG PO TABS: 1.0000 | ORAL_TABLET | Freq: Four times a day (QID) | ORAL | 0 refills | Status: DC | PRN

## 2017-03-01 NOTE — Telephone Encounter (Signed)
Patient called for refill:  °HYDROcodone-acetaminophen (NORCO/VICODIN) 5-325 MG tablet 56 tablet  ° ° °

## 2017-03-02 ENCOUNTER — Emergency Department (HOSPITAL_COMMUNITY): Payer: Medicare Other

## 2017-03-02 ENCOUNTER — Encounter (HOSPITAL_COMMUNITY): Payer: Self-pay | Admitting: Emergency Medicine

## 2017-03-02 ENCOUNTER — Emergency Department (HOSPITAL_COMMUNITY)
Admission: EM | Admit: 2017-03-02 | Discharge: 2017-03-02 | Disposition: A | Discharge status: Home / Self Care | Payer: Medicare Other | Attending: Emergency Medicine | Admitting: Emergency Medicine

## 2017-03-02 DIAGNOSIS — Z79899 Other long term (current) drug therapy: Secondary | ICD-10-CM | POA: Diagnosis not present

## 2017-03-02 DIAGNOSIS — E876 Hypokalemia: Secondary | ICD-10-CM

## 2017-03-02 DIAGNOSIS — R319 Hematuria, unspecified: Secondary | ICD-10-CM | POA: Insufficient documentation

## 2017-03-02 DIAGNOSIS — R1032 Left lower quadrant pain: Secondary | ICD-10-CM | POA: Insufficient documentation

## 2017-03-02 DIAGNOSIS — R1030 Lower abdominal pain, unspecified: Secondary | ICD-10-CM | POA: Diagnosis present

## 2017-03-02 DIAGNOSIS — Z87891 Personal history of nicotine dependence: Secondary | ICD-10-CM | POA: Diagnosis not present

## 2017-03-02 DIAGNOSIS — R109 Unspecified abdominal pain: Secondary | ICD-10-CM

## 2017-03-02 DIAGNOSIS — I10 Essential (primary) hypertension: Secondary | ICD-10-CM | POA: Insufficient documentation

## 2017-03-02 LAB — CBC WITH DIFFERENTIAL/PLATELET
Basophils Absolute: 0 10*3/uL (ref 0.0–0.1)
Basophils Relative: 0 %
Eosinophils Absolute: 0.1 10*3/uL (ref 0.0–0.7)
Eosinophils Relative: 1 %
HCT: 31.1 % — ABNORMAL LOW (ref 36.0–46.0)
Hemoglobin: 10.8 g/dL — ABNORMAL LOW (ref 12.0–15.0)
Lymphocytes Relative: 45 %
Lymphs Abs: 2.9 10*3/uL (ref 0.7–4.0)
MCH: 32.1 pg (ref 26.0–34.0)
MCHC: 34.7 g/dL (ref 30.0–36.0)
MCV: 92.6 fL (ref 78.0–100.0)
Monocytes Absolute: 0.7 10*3/uL (ref 0.1–1.0)
Monocytes Relative: 11 %
Neutro Abs: 2.8 10*3/uL (ref 1.7–7.7)
Neutrophils Relative %: 43 %
Platelets: 127 10*3/uL — ABNORMAL LOW (ref 150–400)
RBC: 3.36 MIL/uL — ABNORMAL LOW (ref 3.87–5.11)
RDW: 12.6 % (ref 11.5–15.5)
WBC: 6.4 10*3/uL (ref 4.0–10.5)

## 2017-03-02 LAB — URINALYSIS, ROUTINE W REFLEX MICROSCOPIC
Bilirubin Urine: NEGATIVE
Glucose, UA: NEGATIVE mg/dL
Ketones, ur: NEGATIVE mg/dL
Leukocytes, UA: NEGATIVE
Nitrite: NEGATIVE
Protein, ur: NEGATIVE mg/dL
Specific Gravity, Urine: 1.004 — ABNORMAL LOW (ref 1.005–1.030)
pH: 5 (ref 5.0–8.0)

## 2017-03-02 LAB — BASIC METABOLIC PANEL
Anion gap: 13 (ref 5–15)
BUN: 12 mg/dL (ref 6–20)
CO2: 24 mmol/L (ref 22–32)
Calcium: 8.8 mg/dL — ABNORMAL LOW (ref 8.9–10.3)
Chloride: 92 mmol/L — ABNORMAL LOW (ref 101–111)
Creatinine, Ser: 1 mg/dL (ref 0.44–1.00)
GFR calc Af Amer: >60 mL/min (ref >60)
GFR calc non Af Amer: >60 mL/min (ref >60)
Glucose, Bld: 106 mg/dL — ABNORMAL HIGH (ref 65–99)
Potassium: 2.8 mmol/L — ABNORMAL LOW (ref 3.5–5.1)
Sodium: 129 mmol/L — ABNORMAL LOW (ref 135–145)

## 2017-03-02 MED ORDER — POTASSIUM CHLORIDE CRYS ER 20 MEQ PO TBCR
40.0000 meq | EXTENDED_RELEASE_TABLET | Freq: Once | ORAL | Status: AC
  Administered 2017-03-02: 40 meq via ORAL
  Filled 2017-03-02 (×2): qty 2

## 2017-03-02 MED ORDER — POTASSIUM CHLORIDE CRYS ER 20 MEQ PO TBCR
40.0000 meq | EXTENDED_RELEASE_TABLET | Freq: Once | ORAL | Status: AC
  Administered 2017-03-02: 40 meq via ORAL
  Filled 2017-03-02: qty 2

## 2017-03-02 MED ORDER — SODIUM CHLORIDE 0.9 % IV BOLUS (SEPSIS)
1000.0000 mL | Freq: Once | INTRAVENOUS | Status: AC
  Administered 2017-03-02: 1000 mL via INTRAVENOUS

## 2017-03-02 NOTE — ED Notes (Signed)
Pt is currently eating breakfast and will ambulate after.

## 2017-03-02 NOTE — ED Triage Notes (Signed)
Pt c/o lower abd pain and bilateral flank pain x one month. Pt has dark foul smelling urine.

## 2017-03-02 NOTE — ED Provider Notes (Signed)
[facility] DEPT Provider Note   CSN: [id number] Arrival date & time: 03/02/17  0218     History   Chief Complaint Chief Complaint  Patient presents with  . Flank Pain    HPI [name] is a 61 y.o. female.  The history is provided by the patient.  Flank Pain  This is a new problem. The problem occurs constantly. The problem has been gradually worsening. Associated symptoms include abdominal pain. Nothing aggravates the symptoms. Nothing relieves the symptoms.  pt reports recent left flank pain and hematuria She also reports abdominal pain No fever/vomiting No other complaints reported  Past Medical History:  Diagnosis Date  . Alcohol abuse   . Chronic abdominal pain   . Chronic diarrhea   . Contracture of muscle of hand    right  . HTN (hypertension)   . PUD (peptic ulcer disease)    remote  . Seizures ([place])    since stroke, no recent seizures  . Shortness of breath    with exertion  . Stroke [facility])    age 40, required brain surgery    Patient Active Problem List   Diagnosis Date Noted  . UGI bleed 12/24/2016  . Erosive esophagitis   . AKI (acute kidney injury) ([place]) 07/30/2016  . Anemia 11/25/2015  . GIB (gastrointestinal bleeding) 10/16/2015  . Abnormal LFTs   . Hematemesis with nausea   . ETOH abuse   . Thrombocytopenia ([place])   . GI bleed 10/14/2015  . Upper GI bleed 10/18/2014  . Hematemesis 10/18/2014  . Reflux esophagitis   . Alcohol intoxication ([place]) 10/10/2014  . Sinus tachycardia ([place]) 10/10/2014  . Hypokalemia 10/10/2014  . Seizure disorder ([place]) 10/10/2014  . History of rectal bleeding 09/05/2012  . Chronic diarrhea 09/05/2012  . Alcohol abuse 09/05/2012  . Vomiting 09/05/2012  . Esophageal dysphagia 09/05/2012  . GERD (gastroesophageal reflux disease) 09/05/2012  . Upper abdominal pain 09/05/2012  . FX CLOSED TIBIA NOS 07/27/2007  . STROKE-With also h/o Aneurysm clipping? 07/25/2007  . SEIZURES 07/25/2007  . History of  cardiovascular disorder 07/25/2007    Past Surgical History:  Procedure Laterality Date  . ABDOMINAL HYSTERECTOMY  2004   complete  . BRAIN SURGERY     age 39, stroke  . COLONOSCOPY    . COLONOSCOPY WITH PROPOFOL  10/03/2012   Dr. [name]: moderate diverticulosis, small internal hemorrhoids, TUBULAR ADENOMA. Next colonoscopy 2023 per Dr. [name].  . ESOPHAGOGASTRODUODENOSCOPY N/A 10/18/2014   Dr. Gala Romney during inpatient hospitalization: severe exudative/erosive reflux esophagitis as source of trivial upper GI bleed. No varices   . ESOPHAGOGASTRODUODENOSCOPY (EGD) WITH PROPOFOL  10/03/2012   Dr. [name]: stricture at [place] junction s/p dilation, mild gastritis negative H.pylori   . right tib-fib fracture  2008  . SAVORY DILATION  10/03/2012   Procedure: SAVORY DILATION;  Surgeon: Danie Binder, MD;  Location: [facility];  Service: Endoscopy;  Laterality: N/A;  started at 1303,  dilated 12.8-16mm    OB History    Gravida Para Term Preterm AB Living   4 3 3   1 3    SAB TAB Ectopic Multiple Live Births   1               Home Medications    Prior to Admission medications   Medication Sig Start Date End Date Taking? Authorizing Provider  albuterol (PROVENTIL HFA;VENTOLIN HFA) 108 (90 BASE) MCG/ACT inhaler Inhale 2 puffs into the lungs every 6 (six) hours as needed for wheezing or  shortness of breath.     [provider]  folic acid (FOLVITE) 1 MG tablet Take 1 tablet (1 mg total) by mouth daily. 08/02/16   [name], MD  HYDROcodone-acetaminophen (NORCO/VICODIN) 5-325 MG tablet Take 1 tablet by mouth every 6 (six) hours as needed for moderate pain (Must last 14 days.Do not take and drive a car or use machinery.). 03/01/17   Sanjuana Kava, MD  ipratropium-albuterol (DUONEB) 0.5-2.5 (3) MG/3ML SOLN Take 3 mLs by nebulization every 6 (six) hours as needed (for shortness of breath/wheezing).     [provider]  lisinopril-hydrochlorothiazide (PRINZIDE,ZESTORETIC) 20-12.5 MG  per tablet Take 1 tablet by mouth daily.    [provider]  pantoprazole (PROTONIX) 40 MG tablet Take 1 tablet (40 mg total) by mouth 2 (two) times daily before a meal. 12/26/16   [name], MD  PHENObarbital (LUMINAL) 30 MG tablet Take 30 mg by mouth 2 (two) times daily.    [provider]  phenytoin (DILANTIN) 100 MG ER capsule Take 100 mg by mouth 3 (three) times daily.    [provider]  solifenacin (VESICARE) 5 MG tablet Take 5 mg by mouth daily.    [provider]  thiamine 100 MG tablet Take 1 tablet (100 mg total) by mouth daily. 08/02/16   [name], MD    Family History Family History  Problem Relation Age of Onset  . Liver disease Sister        etoh  . Colon cancer Neg Hx     Social History Social History  Substance Use Topics  . Smoking status: Former Smoker    Packs/day: 0.04    Years: 9.00    Types: Cigarettes    Quit date: 07/28/2014  . Smokeless tobacco: Never Used  . Alcohol use No     Comment: pt tried to drink today but was able to keep it down     Allergies   Patient has no known allergies.   Review of Systems Review of Systems  Constitutional: Negative for fever.  Gastrointestinal: Positive for abdominal pain. Negative for vomiting.  Genitourinary: Positive for flank pain and hematuria.  All other systems reviewed and are negative.    Physical Exam Updated Vital Signs BP (!) 84/48   Pulse 92   Temp 97.7 F (36.5 C) (Oral)   Resp 20   Wt 68 kg   SpO2 98%   BMI 25.75 kg/m   Physical Exam CONSTITUTIONAL: Chronically ill appearing HEAD: Normocephalic/atraumatic EYES: EOMI ENMT: Mucous membranes moist NECK: supple no meningeal signs SPINE/BACK:entire spine nontender CV: S1/S2 noted, no murmurs/rubs/gallops noted LUNGS: Lungs are clear to auscultation bilaterally, no apparent distress ABDOMEN: soft, mild LLQ tenderness, no rebound or guarding, bowel sounds noted throughout abdomen OV:ZCHY  cva tenderness NEURO: Pt is awake/alert, right hemiparesis noted (chronic) EXTREMITIES: pulses normal/equal, full ROM SKIN: warm, color normal   ED Treatments / Results  Labs (all labs ordered are listed, but only abnormal results are displayed) Labs Reviewed  URINALYSIS, ROUTINE W REFLEX MICROSCOPIC - Abnormal; Notable for the following:       Result Value   Color, Urine STRAW (*)    Specific Gravity, Urine 1.004 (*)    Hgb urine dipstick SMALL (*)    Bacteria, UA RARE (*)    Squamous Epithelial / LPF 0-5 (*)    All other components within normal limits  BASIC METABOLIC PANEL - Abnormal; Notable for the following:    Sodium 129 (*)  Potassium 2.8 (*)    Chloride 92 (*)    Glucose, Bld 106 (*)    Calcium 8.8 (*)    All other components within normal limits  CBC WITH DIFFERENTIAL/PLATELET - Abnormal; Notable for the following:    RBC 3.36 (*)    Hemoglobin 10.8 (*)    HCT 31.1 (*)    Platelets 127 (*)    All other components within normal limits  URINE CULTURE    EKG  EKG Interpretation None       Radiology Ct Renal Stone Study  Result Date: 03/02/2017 CLINICAL DATA:  Lower abdominal pain and bilateral flank pain for 1 month. EXAM: CT ABDOMEN AND PELVIS WITHOUT CONTRAST TECHNIQUE: Multidetector CT imaging of the abdomen and pelvis was performed following the standard protocol without IV contrast. COMPARISON:  Noncontrast CT 12/12/2016 FINDINGS: Lower chest: Mild dependent atelectasis. No pleural fluid. Unchanged size small to moderate hiatal hernia. Hepatobiliary: Nodular hepatic contours. Decreased density consistent with steatosis. No evidence of focal lesion on noncontrast exam. Gallbladder minimally distended, no calcified gallstone. No biliary dilatation. Pancreas: No ductal dilatation or inflammation. Spleen: Normal in size without focal abnormality. Adrenals/Urinary Tract: No adrenal nodule. No hydronephrosis or perinephric edema. No urolithiasis. Ureters  decompressed. Urinary bladder is physiologically distended, no bladder wall thickening. Stomach/Bowel: Multifocal colonic diverticulosis involving the entire colon, most prominent in the distal descending and sigmoid. No acute diverticulitis. Normal appendix. No small bowel distention or obstruction. Vascular/Lymphatic: Minimal aortic atherosclerosis. No aneurysm. No adenopathy. Reproductive: Status post hysterectomy. No adnexal masses. Other: No ascites.  No free air or intra-abdominal fluid collection. Musculoskeletal: Degenerative disc disease and facet arthropathy throughout spine. Advanced degenerative change of the left hip. There are no acute or suspicious osseous abnormalities. IMPRESSION: 1. No renal stones or obstructive uropathy. No explanation for flank pain. No acute abnormality. 2. Cirrhotic hepatic morphology. 3. Colonic diverticulosis without acute inflammation. 4. Mild aortic atherosclerosis.  No aneurysm. 5. Small to moderate hiatal hernia. Electronically Signed   By: Jeb Levering M.D.   On: 03/02/2017 07:02    Procedures Procedures (including critical care time)  Medications Ordered in ED Medications  potassium chloride SA (K-DUR,KLOR-CON) CR tablet 40 mEq (not administered)  potassium chloride SA (K-DUR,KLOR-CON) CR tablet 40 mEq (40 mEq Oral Given 03/02/17 0447)  sodium chloride 0.9 % bolus 1,000 mL (0 mLs Intravenous Stopped 03/02/17 0744)     Initial Impression / Assessment and Plan / ED Course  I have reviewed the triage vital signs and the nursing notes.  Pertinent labs results that were available during my care of the patient were reviewed by me and considered in my medical decision making (see chart for details).     7:32 AM Pt chronically ill She is a poor historian Her main issue is flank pain and hematuria CT imaging does not reveal any acute abnormalities She did have HYPOkalemia, she was given replacement  Overall, appears at baseline 7:57 AM Pt  awake/alert SBP > 100 She is eating breakfast She denies new complaints Will d/c home   Final Clinical Impressions(s) / ED Diagnoses   Final diagnoses:  Flank pain  Hypokalemia    New Prescriptions New Prescriptions   No medications on file     Ripley Fraise, MD 03/02/17 [id number]

## 2017-03-02 NOTE — ED Notes (Signed)
Patient assisted to restroom via wheelchair. Patient voided.

## 2017-03-04 LAB — URINE CULTURE: Culture: 30000 — AB

## 2017-03-15 ENCOUNTER — Encounter: Payer: Self-pay | Admitting: Gastroenterology

## 2017-03-15 ENCOUNTER — Ambulatory Visit: Payer: Medicare Other | Admitting: Gastroenterology

## 2017-03-15 ENCOUNTER — Telehealth: Payer: Self-pay | Admitting: Gastroenterology

## 2017-03-15 NOTE — Telephone Encounter (Signed)
PATIENT WAS A NO SHOW AND LETTER SENT  °

## 2017-03-28 ENCOUNTER — Telehealth: Payer: Self-pay | Admitting: Orthopaedic Surgery

## 2017-03-28 NOTE — Telephone Encounter (Signed)
Patient called for refill:  °HYDROcodone-acetaminophen (NORCO/VICODIN) 5-325 MG tablet 50 tablet  ° ° °

## 2017-03-29 MED ORDER — HYDROCODONE-ACETAMINOPHEN 5-325 MG PO TABS: ORAL_TABLET | ORAL | 0 refills | Status: DC

## 2017-04-21 ENCOUNTER — Encounter: Payer: Self-pay | Admitting: Gastroenterology

## 2017-04-21 ENCOUNTER — Ambulatory Visit (INDEPENDENT_AMBULATORY_CARE_PROVIDER_SITE_OTHER): Payer: Medicare Other | Admitting: Gastroenterology

## 2017-04-21 VITALS — BP 117/70 | HR 91 | Temp 97.1°F | Ht 64.0 in | Wt 149.0 lb

## 2017-04-21 DIAGNOSIS — F1010 Alcohol abuse, uncomplicated: Secondary | ICD-10-CM | POA: Diagnosis not present

## 2017-04-21 DIAGNOSIS — D696 Thrombocytopenia, unspecified: Secondary | ICD-10-CM | POA: Diagnosis not present

## 2017-04-21 DIAGNOSIS — K7460 Unspecified cirrhosis of liver: Secondary | ICD-10-CM | POA: Insufficient documentation

## 2017-04-21 DIAGNOSIS — G8929 Other chronic pain: Secondary | ICD-10-CM | POA: Diagnosis not present

## 2017-04-21 DIAGNOSIS — R1013 Epigastric pain: Secondary | ICD-10-CM | POA: Diagnosis not present

## 2017-04-21 DIAGNOSIS — K2210 Ulcer of esophagus without bleeding: Secondary | ICD-10-CM | POA: Diagnosis not present

## 2017-04-21 DIAGNOSIS — K7030 Alcoholic cirrhosis of liver without ascites: Secondary | ICD-10-CM | POA: Diagnosis not present

## 2017-04-21 DIAGNOSIS — D649 Anemia, unspecified: Secondary | ICD-10-CM

## 2017-04-21 NOTE — Progress Notes (Signed)
cc'ed to pcp °

## 2017-04-21 NOTE — Patient Instructions (Addendum)
1. Please have your ultrasound and labs done. We will contact you with results as available.  2. You need to avoid ALL alcohol.  3. Return to the office in 2 months.

## 2017-04-21 NOTE — Assessment & Plan Note (Signed)
61 year old female with history of alcohol abuse, thrombocytopenia, anemia who presents for follow-up. Recent CT imaging without contrast revealed nodular hepatic border but otherwise liver was not well evaluated without contrast. Spleen normal in size. Hepatitis B surface antigen, hepatitis C antibody negative in 2016. Given suspected cirrhosis, we need to update labs, check hepatitis A and B immune status, screen for hepatoma. Discussed with patient, need to avoid all alcohol use. Discussed that she likely does have cirrhosis and we need to preserve hepatic function. She seems to understand, did not desire any assistance with alcohol cessation. No evidence of esophageal varices back in 2016.  Labs and ultrasound is planned. We'll have her come back in a couple months for follow-up. Any necessary workup to be done in the interim based on findings.

## 2017-04-21 NOTE — Progress Notes (Signed)
Primary Care Physician: [name], MD  Primary Gastroenterologist:  Barney Drain, MD   Chief Complaint  Patient presents with  . Anemia    occ dark stool    HPI: [name] is a 61 y.o. female here for follow-up of thrombocytopenia, anemia, alcohol abuse. Historically noncompliant with several no-shows at office visits (03/15/2017, 03/24/2016). She never had the right upper quadrant ultrasound scheduled for 12/31/2016 to evaluate for cirrhosis given thrombocytopenia. She was last seen back in March during hospitalization when she was admitted with coffee-ground emesis. Felt to be due to esophagitis/EtOH gastritis and suspected trivial GI bleeding. She had EGD in January 2016 with severe exudative/erosive esophagitis, markedly inflamed esophageal mucosa in the setting of chronic alcohol abuse.   Since we saw her last she did have a CT renal protocol without contrast on 03/02/2017 with nodular hepatic contours. Otherwise not well evaluated in the setting of no contrast. Her spleen was unremarkable.  Clinically she is stable. She has only occasional abdominal pain which she describes as a swelling in the upper abdomen. She states her heartburn is well controlled on pantoprazole, takes at least once daily and sometimes twice. Denies dysphagia, vomiting. Appetite is good. Has a bowel movement most days. Denies diarrhea. No melena rectal bleeding. She continues to drink alcohol at least twice per week. She estimates drinking at least three 12 ounce cans of beer when she does drink.  She continues to take hydrocodone as needed for shoulder pain but currently is out.  Current Outpatient Prescriptions  Medication Sig Dispense Refill  . albuterol (PROVENTIL HFA;VENTOLIN HFA) 108 (90 BASE) MCG/ACT inhaler Inhale 2 puffs into the lungs every 6 (six) hours as needed for wheezing or shortness of breath.     . folic acid (FOLVITE) 1 MG tablet Take 1 tablet (1 mg total) by mouth daily. 30  tablet 3  . ipratropium-albuterol (DUONEB) 0.5-2.5 (3) MG/3ML SOLN Take 3 mLs by nebulization every 6 (six) hours as needed (for shortness of breath/wheezing).     Marland Kitchen lisinopril-hydrochlorothiazide (PRINZIDE,ZESTORETIC) 20-12.5 MG per tablet Take 1 tablet by mouth daily.    . pantoprazole (PROTONIX) 40 MG tablet Take 1 tablet (40 mg total) by mouth 2 (two) times daily before a meal. 60 tablet 3  . PHENObarbital (LUMINAL) 30 MG tablet Take 30 mg by mouth 2 (two) times daily.    . phenytoin (DILANTIN) 100 MG ER capsule Take 100 mg by mouth 3 (three) times daily.    . solifenacin (VESICARE) 5 MG tablet Take 5 mg by mouth daily.    Marland Kitchen thiamine 100 MG tablet Take 1 tablet (100 mg total) by mouth daily. 30 tablet 3  . HYDROcodone-acetaminophen (NORCO/VICODIN) 5-325 MG tablet One tablet by mouth every six hours as needed for pain.  Seven day limit per Medicaid guidelines. (Patient not taking: Reported on 04/21/2017) 28 tablet 0   No current facility-administered medications for this visit.     Allergies as of 04/21/2017  . (No Known Allergies)   Past Medical History:  Diagnosis Date  . Alcohol abuse   . Chronic abdominal pain   . Chronic diarrhea   . Contracture of muscle of hand    right  . HTN (hypertension)   . PUD (peptic ulcer disease)    remote  . Seizures ([place])    since stroke, no recent seizures  . Shortness of breath    with exertion  . Stroke [facility])    age 25, required brain  surgery   Past Surgical History:  Procedure Laterality Date  . ABDOMINAL HYSTERECTOMY  2004   complete  . BRAIN SURGERY     age 9, stroke  . COLONOSCOPY    . COLONOSCOPY WITH PROPOFOL  10/03/2012   Dr. [name]: moderate diverticulosis, small internal hemorrhoids, TUBULAR ADENOMA. Next colonoscopy 2023 per Dr. [name].  . ESOPHAGOGASTRODUODENOSCOPY N/A 10/18/2014   Dr. Gala Romney during inpatient hospitalization: severe exudative/erosive reflux esophagitis as source of trivial upper GI bleed. No varices   .  ESOPHAGOGASTRODUODENOSCOPY (EGD) WITH PROPOFOL  10/03/2012   Dr. [name]: stricture at [place] junction s/p dilation, mild gastritis negative H.pylori   . right tib-fib fracture  2008  . SAVORY DILATION  10/03/2012   Procedure: SAVORY DILATION;  Surgeon: Danie Binder, MD;  Location: [facility];  Service: Endoscopy;  Laterality: N/A;  started at 1303,  dilated 12.8-16mm    ROS:  General: Negative for anorexia, weight loss, fever, chills, fatigue, weakness. ENT: Negative for hoarseness, difficulty swallowing , nasal congestion. CV: Negative for chest pain, angina, palpitations, dyspnea on exertion, peripheral edema.  Respiratory: Negative for dyspnea at rest, dyspnea on exertion, cough, sputum, wheezing.  GI: See history of present illness. GU:  Negative for dysuria, hematuria, urinary incontinence, urinary frequency, nocturnal urination.  Endo: Negative for unusual weight change.    Physical Examination:   BP 117/70   Pulse 91   Temp (!) 97.1 F (36.2 C) (Oral)   Ht 5\' 4"  (1.626 m)   Wt 149 lb (67.6 kg)   BMI 25.58 kg/m   General: Well-nourished, well-developed in no acute distress. Seems to have basic understanding but fairly simple in conversations. She did not want to get on exam table voicing difficult with stepping up due to right leg. Chronic issues.  Eyes: No icterus. Mouth: Oropharyngeal mucosa moist and pink , no lesions erythema or exudate. Lungs: Clear to auscultation bilaterally.  Heart: Regular rate and rhythm, no murmurs rubs or gallops.  Abdomen: Bowel sounds are normal, nontender, nondistended, no hepatosplenomegaly or masses, no abdominal bruits or hernia , no rebound or guarding.  Exam difficult done in chair per patient request Extremities: No lower extremity edema. No clubbing or deformities. Neuro: Alert and oriented x 4   Skin: Warm and dry, no jaundice.   Psych: Alert and cooperative, normal mood and affect.  Labs:  Lab Results  Component Value Date    CREATININE 1.00 03/02/2017   BUN 12 03/02/2017   NA 129 (L) 03/02/2017   K 2.8 (L) 03/02/2017   CL 92 (L) 03/02/2017   CO2 24 03/02/2017   Lab Results  Component Value Date   WBC 6.4 03/02/2017   HGB 10.8 (L) 03/02/2017   HCT 31.1 (L) 03/02/2017   MCV 92.6 03/02/2017   PLT 127 (L) 03/02/2017   Lab Results  Component Value Date   ALT 24 12/24/2016   AST 51 (H) 12/24/2016   ALKPHOS 97 12/24/2016   BILITOT 1.4 (H) 12/24/2016   Lab Results  Component Value Date   INR 1.15 12/23/2016   INR 1.24 10/14/2015   INR 1.14 10/14/2015   HCV AB negative 09/2015 HEP B surf ag negative 09/2015  Imaging Studies: No results found.

## 2017-04-24 NOTE — Progress Notes (Signed)
REVIEWED-NO ADDITIONAL RECOMMENDATIONS. 

## 2017-04-26 ENCOUNTER — Ambulatory Visit: Payer: Medicare Other | Admitting: Orthopaedic Surgery

## 2017-04-27 ENCOUNTER — Ambulatory Visit (HOSPITAL_COMMUNITY)
Admission: RE | Admit: 2017-04-27 | Discharge: 2017-04-27 | Disposition: A | Discharge status: Home / Self Care | Payer: Medicare Other | Source: Ambulatory Visit | Attending: Gastroenterology | Admitting: Gastroenterology

## 2017-04-27 DIAGNOSIS — D649 Anemia, unspecified: Secondary | ICD-10-CM

## 2017-04-27 DIAGNOSIS — K7030 Alcoholic cirrhosis of liver without ascites: Secondary | ICD-10-CM

## 2017-04-27 DIAGNOSIS — F1010 Alcohol abuse, uncomplicated: Secondary | ICD-10-CM | POA: Diagnosis not present

## 2017-04-27 DIAGNOSIS — D696 Thrombocytopenia, unspecified: Secondary | ICD-10-CM | POA: Diagnosis not present

## 2017-04-27 DIAGNOSIS — K2210 Ulcer of esophagus without bleeding: Secondary | ICD-10-CM | POA: Diagnosis not present

## 2017-04-27 LAB — CBC WITH DIFFERENTIAL/PLATELET
Basophils Absolute: 37 cells/uL (ref 0–200)
Basophils Relative: 1 %
Eosinophils Absolute: 0 cells/uL — ABNORMAL LOW (ref 15–500)
Eosinophils Relative: 0 %
HCT: 31 % — ABNORMAL LOW (ref 35.0–45.0)
Hemoglobin: 11.1 g/dL — ABNORMAL LOW (ref 11.7–15.5)
Lymphocytes Relative: 23 %
Lymphs Abs: 851 cells/uL (ref 850–3900)
MCH: 32.4 pg (ref 27.0–33.0)
MCHC: 35.8 g/dL (ref 32.0–36.0)
MCV: 90.4 fL (ref 80.0–100.0)
MPV: 10.1 fL (ref 7.5–12.5)
Monocytes Absolute: 555 cells/uL (ref 200–950)
Monocytes Relative: 15 %
Neutro Abs: 2257 cells/uL (ref 1500–7800)
Neutrophils Relative %: 61 %
Platelets: 155 10*3/uL (ref 140–400)
RBC: 3.43 MIL/uL — ABNORMAL LOW (ref 3.80–5.10)
RDW: 13.4 % (ref 11.0–15.0)
WBC: 3.7 10*3/uL — ABNORMAL LOW (ref 3.8–10.8)

## 2017-04-27 LAB — IRON AND TIBC
%SAT: 55 % — ABNORMAL HIGH (ref 11–50)
Iron: 193 ug/dL — ABNORMAL HIGH (ref 45–160)
TIBC: 354 ug/dL (ref 250–450)
UIBC: 161 ug/dL

## 2017-04-27 LAB — COMPREHENSIVE METABOLIC PANEL
ALT: 16 U/L (ref 6–29)
AST: 46 U/L — ABNORMAL HIGH (ref 10–35)
Albumin: 3.9 g/dL (ref 3.6–5.1)
Alkaline Phosphatase: 101 U/L (ref 33–130)
BUN: 8 mg/dL (ref 7–25)
CO2: 22 mmol/L (ref 20–31)
Calcium: 9.3 mg/dL (ref 8.6–10.4)
Chloride: 86 mmol/L — ABNORMAL LOW (ref 98–110)
Creat: 0.65 mg/dL (ref 0.50–0.99)
Glucose, Bld: 105 mg/dL — ABNORMAL HIGH (ref 65–99)
Potassium: 3.5 mmol/L (ref 3.5–5.3)
Sodium: 125 mmol/L — ABNORMAL LOW (ref 135–146)
Total Bilirubin: 0.9 mg/dL (ref 0.2–1.2)
Total Protein: 7.8 g/dL (ref 6.1–8.1)

## 2017-04-27 LAB — HEPATITIS B SURFACE ANTIBODY,QUALITATIVE: Hep B S Ab: NEGATIVE

## 2017-04-27 LAB — PROTIME-INR
INR: 1.1
Prothrombin Time: 11.2 s (ref 9.0–11.5)

## 2017-04-27 LAB — HEPATITIS A ANTIBODY, TOTAL: Hep A Total Ab: REACTIVE — AB

## 2017-04-28 LAB — AFP TUMOR MARKER: AFP-Tumor Marker: 7 ng/mL — ABNORMAL HIGH (ref <6.1)

## 2017-04-28 LAB — FERRITIN: Ferritin: 104 ng/mL (ref 20–288)

## 2017-05-02 ENCOUNTER — Other Ambulatory Visit: Payer: Self-pay

## 2017-05-02 DIAGNOSIS — R978 Other abnormal tumor markers: Secondary | ICD-10-CM

## 2017-05-02 DIAGNOSIS — E871 Hypo-osmolality and hyponatremia: Secondary | ICD-10-CM

## 2017-05-02 DIAGNOSIS — R772 Abnormality of alphafetoprotein: Secondary | ICD-10-CM

## 2017-05-02 NOTE — Progress Notes (Signed)
When pt calls, give [place] results also. [employer] at front for pick up.

## 2017-05-02 NOTE — Progress Notes (Signed)
I entered the orders for the BMET for 05/09/2017 and the AFP for 06/13/2017. They both printed together. I called Selma at [facility] and she said she would cancel the AFP that printed with the BMET and for me to put in new order for the AFP. New order for AFP for 06/13/2017.

## 2017-05-02 NOTE — Progress Notes (Signed)
Tried to call pt. Phone not working. Mailing a letter for her to call right away.

## 2017-05-02 NOTE — Progress Notes (Signed)
routing

## 2017-05-02 NOTE — Progress Notes (Signed)
See result note for labs. U/s concerning for cirrhosis with slight nodularity of liver border. Needs to stop etoh. Previously declined assistance.  Repeat ruq u/s in six months.

## 2017-05-02 NOTE — Progress Notes (Signed)
Labs with stable anemia. Last EGD 2016. Last TCS 2013. Labs with MELD 7. Indicating preserved liver function, but u/s consistent with cirrhosis as we expected. Her liver tumor marker is slightly elevated. Her sodium is low likely due to etoh and liver disease. Iron is up and again likely etoh related. She is immune to hep a but not hep b.  PLEASE HAVE HER COMPLETE IFOBT.  SHE NEEDS VACCINATION FOR HEPATITIS B, PLEASE ARRANGE.  SHE NEEDS TO AVOID ALL ETOH.  WOULD RECOMMEND REPEAT MET-7 IN 1 WEEK FOR LOW NA. WOULD RECOMMEND REPEAT AFP TUMOR MARKER IN 6 WEEKS.  OV IN 6 WEEKS TO F/U PENDING LABS, IFOBT. ANEMIA. ELEVATED AFP.

## 2017-05-02 NOTE — Progress Notes (Signed)
ON RECALL  °

## 2017-05-02 NOTE — Progress Notes (Signed)
Tried to call, number not working.

## 2017-05-02 NOTE — Progress Notes (Signed)
Mailed letter for pt to call for results.  

## 2017-05-03 ENCOUNTER — Encounter: Payer: Self-pay | Admitting: Orthopaedic Surgery

## 2017-05-03 ENCOUNTER — Ambulatory Visit (INDEPENDENT_AMBULATORY_CARE_PROVIDER_SITE_OTHER): Payer: Medicare Other | Admitting: Orthopaedic Surgery

## 2017-05-03 VITALS — BP 139/82 | HR 89 | Temp 96.7°F | Ht 64.0 in | Wt 149.0 lb

## 2017-05-03 DIAGNOSIS — M25511 Pain in right shoulder: Secondary | ICD-10-CM | POA: Diagnosis not present

## 2017-05-03 DIAGNOSIS — G8929 Other chronic pain: Secondary | ICD-10-CM

## 2017-05-03 MED ORDER — HYDROCODONE-ACETAMINOPHEN 5-325 MG PO TABS: ORAL_TABLET | ORAL | 0 refills | Status: DC

## 2017-05-03 NOTE — Progress Notes (Signed)
Patient [name], female DOB:Apr 02, 1956, 61 y.o. TMH:[id number]  Chief Complaint  Patient presents with  . Shoulder Pain    right    HPI  [name] is a 61 y.o. female who has chronic pain of the right shoulder. 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  She has no new trauma. HPI  Body mass index is 25.58 kg/m.  ROS  Review of Systems  Constitutional:       Patient does not have Diabetes Mellitus. Patient has hypertension. Patient has COPD or shortness of breath. Patient does not have BMI > 35. Patient does not have current smoking history.  Respiratory: Negative for shortness of breath.   Cardiovascular: Negative for chest pain.  Endocrine: Positive for cold intolerance.  Musculoskeletal: Positive for arthralgias, gait problem, myalgias and neck pain.  Allergic/Immunologic: Positive for environmental allergies.  Neurological: Positive for weakness (right side) and headaches.    Past Medical History:  Diagnosis Date  . Alcohol abuse   . Chronic abdominal pain   . Chronic diarrhea   . Contracture of muscle of hand    right  . HTN (hypertension)   . PUD (peptic ulcer disease)    remote  . Seizures ([place])    since stroke, no recent seizures  . Shortness of breath    with exertion  . Stroke [facility])    age 27, required brain surgery    Past Surgical History:  Procedure Laterality Date  . ABDOMINAL HYSTERECTOMY  2004   complete  . BRAIN SURGERY     age 63, stroke  . COLONOSCOPY    . COLONOSCOPY WITH PROPOFOL  10/03/2012   Dr. [name]: moderate diverticulosis, small internal hemorrhoids, TUBULAR ADENOMA. Next colonoscopy 2023 per Dr. [name].  . ESOPHAGOGASTRODUODENOSCOPY N/A 10/18/2014   Dr. Gala Romney during inpatient hospitalization: severe exudative/erosive reflux esophagitis as source of trivial upper GI bleed. No varices   .  ESOPHAGOGASTRODUODENOSCOPY (EGD) WITH PROPOFOL  10/03/2012   Dr. [name]: stricture at [place] junction s/p dilation, mild gastritis negative H.pylori   . right tib-fib fracture  2008  . SAVORY DILATION  10/03/2012   Procedure: SAVORY DILATION;  Surgeon: Danie Binder, MD;  Location: [facility];  Service: Endoscopy;  Laterality: N/A;  started at 1303,  dilated 12.8-16mm    Family History  Problem Relation Age of Onset  . Liver disease Sister        etoh  . Colon cancer Neg Hx     Social History Social History  Substance Use Topics  . Smoking status: Former Smoker    Packs/day: 0.04    Years: 9.00    Types: Cigarettes    Quit date: 07/28/2014  . Smokeless tobacco: Never Used  . Alcohol use Yes     Comment: "sometimes"    No Known Allergies  Current Outpatient Prescriptions  Medication Sig Dispense Refill  . albuterol (PROVENTIL HFA;VENTOLIN HFA) 108 (90 BASE) MCG/ACT inhaler Inhale 2 puffs into the lungs every 6 (six) hours as needed for wheezing or shortness of breath.     . folic acid (FOLVITE) 1 MG tablet Take 1 tablet (1 mg total) by mouth daily. 30 tablet 3  . HYDROcodone-acetaminophen (NORCO/VICODIN) 5-325 MG tablet One tablet by mouth every six hours as needed for pain.  Seven day limit per Medicaid guidelines. 28 tablet 0  . ipratropium-albuterol (DUONEB) 0.5-2.5 (3) MG/3ML SOLN Take  3 mLs by nebulization every 6 (six) hours as needed (for shortness of breath/wheezing).     Marland Kitchen lisinopril-hydrochlorothiazide (PRINZIDE,ZESTORETIC) 20-12.5 MG per tablet Take 1 tablet by mouth daily.    . pantoprazole (PROTONIX) 40 MG tablet Take 1 tablet (40 mg total) by mouth 2 (two) times daily before a meal. 60 tablet 3  . PHENObarbital (LUMINAL) 30 MG tablet Take 30 mg by mouth 2 (two) times daily.    . phenytoin (DILANTIN) 100 MG ER capsule Take 100 mg by mouth 3 (three) times daily.    . solifenacin (VESICARE) 5 MG tablet Take 5 mg by mouth daily.    Marland Kitchen thiamine 100 MG tablet Take 1 tablet (100  mg total) by mouth daily. 30 tablet 3   No current facility-administered medications for this visit.      Physical Exam  Blood pressure 139/82, pulse 89, temperature (!) 96.7 F (35.9 C), height 5\' 4"  (1.626 m), weight 149 lb (67.6 kg).  Constitutional: overall normal hygiene, normal nutrition, well developed, normal grooming, normal body habitus. Assistive device:none  Musculoskeletal: gait and station Limp right, muscle tone and strength are normal on the left, hemiparesis on the right, no tremors are present. Deformity of right hand and fingers with decreased motion of hand and wrist and elbow. .  Neurological: coordination overall normal.  Deep tendon reflex/nerve stretch intact.  Sensation normal.  Cranial nerves II-XII intact.   Skin:   Normal overall no scars, lesions, ulcers or rashes. No psoriasis.  Psychiatric: Alert and oriented x 3.  Recent memory intact, remote memory unclear.  Normal mood and affect. Well groomed.  Good eye contact.  Cardiovascular: overall no swelling, no varicosities, no edema bilaterally, normal temperatures of the legs and arms, no clubbing, cyanosis and good capillary refill.  Lymphatic: palpation is normal.  She has limited motion of the right shoulder and pain.  She has no redness or swelling.  The patient has been educated about the nature of the problem(s) and counseled on treatment options.  The patient appeared to understand what I have discussed and is in agreement with it.  Encounter Diagnosis  Name Primary?  . Chronic right shoulder pain Yes    PLAN Call if any problems.  Precautions discussed.  Continue current medications.   Return to clinic 3 months   I have reviewed the [place] web site prior to prescribing narcotic medicine for this patient.  Electronically Signed Sanjuana Kava, MD 7/17/20188:17 AM

## 2017-05-04 NOTE — Progress Notes (Signed)
I called the pt's emergency phone number, her sister, Armando Reichert @ [phone] and was told this is not her number. It is someone's new work number. So I removed the number so we would not call it again.

## 2017-05-11 ENCOUNTER — Telehealth: Payer: Self-pay

## 2017-05-11 NOTE — Telephone Encounter (Signed)
PT called and updated phone number. She is aware of the labs she needs and said she can't come by the office til Friday.  Lab orders and order for the Hep shots are in bag with iFOBT. She is aware she needs to go to lab right away for one test.

## 2017-05-12 NOTE — Telephone Encounter (Signed)
noted 

## 2017-05-17 LAB — BASIC METABOLIC PANEL
BUN: 11 mg/dL (ref 7–25)
CO2: 32 mmol/L — ABNORMAL HIGH (ref 20–31)
Calcium: 9.4 mg/dL (ref 8.6–10.4)
Chloride: 89 mmol/L — ABNORMAL LOW (ref 98–110)
Creat: 0.92 mg/dL (ref 0.50–0.99)
Glucose, Bld: 110 mg/dL — ABNORMAL HIGH (ref 65–99)
Potassium: 3.1 mmol/L — ABNORMAL LOW (ref 3.5–5.3)
Sodium: 134 mmol/L — ABNORMAL LOW (ref 135–146)

## 2017-05-17 LAB — AFP TUMOR MARKER: AFP-Tumor Marker: 5.9 ng/mL (ref <6.1)

## 2017-05-18 ENCOUNTER — Ambulatory Visit (INDEPENDENT_AMBULATORY_CARE_PROVIDER_SITE_OTHER): Payer: Medicare Other

## 2017-05-18 DIAGNOSIS — D649 Anemia, unspecified: Secondary | ICD-10-CM

## 2017-05-18 LAB — IFOBT (OCCULT BLOOD): IFOBT: NEGATIVE

## 2017-05-18 NOTE — Progress Notes (Signed)
AFP 5.9, improved and normal. Chronic hyponatremia but improved from previously and just below normal cut-off. Potassium mildly low at 3.1. Does she take any potassium supplements? IF NOT: please call in potassium chloride 20 mEq, take two times a day for 3 days. Recheck BMP in 1 week. Further recommendations per Magda Paganini.

## 2017-05-18 NOTE — Progress Notes (Signed)
Tried to call. Bad connection.

## 2017-05-18 NOTE — Progress Notes (Signed)
LMOM to call back I have some important results for her.

## 2017-05-19 NOTE — Progress Notes (Signed)
Called, VM said please call me later. Left Vm if it took. Called emergency contact. Vm full and could not leave a message.  Mailing a letter to call.

## 2017-05-24 ENCOUNTER — Other Ambulatory Visit: Payer: Self-pay

## 2017-05-24 DIAGNOSIS — E876 Hypokalemia: Secondary | ICD-10-CM

## 2017-05-24 NOTE — Progress Notes (Signed)
PT received letter and called. She was informed of results. She has not been taking potassium supplements. She is aware to take the potassium bid for 3 days and recheck lab in 1 week. Order sent to [facility]. I called the Rx to [place] at [facility].  Pt said she feels fine except a little " woozy".

## 2017-05-29 NOTE — Progress Notes (Signed)
Agree with recommendations. Keep ov as planned.

## 2017-05-29 NOTE — Progress Notes (Signed)
Heme negative. Ov as planned.

## 2017-05-30 ENCOUNTER — Telehealth: Payer: Self-pay | Admitting: Gastroenterology

## 2017-05-30 NOTE — Telephone Encounter (Signed)
[phone]  Patient called and stated someone had tried to call her

## 2017-05-30 NOTE — Telephone Encounter (Signed)
PT is aware iFOBT is negative and to keep appt in Sept as planned.

## 2017-05-30 NOTE — Progress Notes (Signed)
LMOM that iFOBT is negative and to keep OV as planned, call if questions.

## 2017-06-22 ENCOUNTER — Ambulatory Visit (INDEPENDENT_AMBULATORY_CARE_PROVIDER_SITE_OTHER): Payer: Medicare Other | Admitting: Gastroenterology

## 2017-06-22 ENCOUNTER — Encounter: Payer: Self-pay | Admitting: Gastroenterology

## 2017-06-22 VITALS — BP 139/81 | HR 102 | Temp 97.6°F | Ht 64.5 in | Wt 146.4 lb

## 2017-06-22 DIAGNOSIS — D649 Anemia, unspecified: Secondary | ICD-10-CM

## 2017-06-22 DIAGNOSIS — K5900 Constipation, unspecified: Secondary | ICD-10-CM

## 2017-06-22 DIAGNOSIS — K7030 Alcoholic cirrhosis of liver without ascites: Secondary | ICD-10-CM

## 2017-06-22 DIAGNOSIS — K2100 Gastro-esophageal reflux disease with esophagitis, without bleeding: Secondary | ICD-10-CM

## 2017-06-22 MED ORDER — POLYETHYLENE GLYCOL 3350 17 GM/SCOOP PO POWD: ORAL | 0 refills | Status: DC

## 2017-06-22 NOTE — Progress Notes (Signed)
cc'ed to pcp °

## 2017-06-22 NOTE — Assessment & Plan Note (Signed)
Clinically she reports she is doing well on pantoprazole twice a day.

## 2017-06-22 NOTE — Assessment & Plan Note (Addendum)
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 She is in the process of being vaccinated for hepatitis B. She has 1 more shot to get. Initially her AFP was mildly elevated but when rechecked was normal. No evidence of hepatoma on ultrasound. Being monitored closely. She has had some hyponatremia and hypokalemia issues. Sodium has improved. She's not sure what medicine she is taking, possibly a diuretic. We have contacted the pharmacy to update her list and make appropriate changes regarding her hypokalemia. Her last EGD was in 2016, no esophageal varices at that time. Next  Plans for repeat AFP, CBC, met 7 in 2 months. She will get appropriate labs for MELD in January or after her next AV here in 4 months. She has been NIC'd for u/s in 10/2017 for hepatoma screening.  Urged patient to stop all alcohol use.

## 2017-06-22 NOTE — Patient Instructions (Signed)
1. You will be due for labs in two months.  2. You will need to come for another office visit in four months.  3. I will contact your pharmacy about your medications and if we need to send in potassium we will. 4. Start Miralax one capful daily as needed for constipation.

## 2017-06-22 NOTE — Progress Notes (Signed)
Primary Care Physician: [name], MD  Primary Gastroenterologist: Barney Drain, MD   Chief Complaint  Patient presents with  . Cirrhosis    HPI: [name] is a 61 y.o. female here for follow-up. She was last seen in July. She has a history of alcoholic cirrhosis. Recent workup indicated slightly elevated AFP at 7 on 04/27/2017. She had hyponatremia, 125 as well. She has stable anemia, recent I FOBT negative. Last EGD 2016 and colonoscopy 2013, see [place] for details. MELD 7 recently. She was advised to have hepatitis B vaccination, order provided. Recommended avoid all alcohol. Repeat labs July 30 with AFP of 5.9, sodium improved 134. Persistent mild hypokalemia. She was supplemented.  Abdominal ultrasound 04/27/2017 showed no focal hepatic mass, she had slight micronodular contour which can be seen with cirrhosis. Spleen normal.  Heartburn well-controlled. BM hard, goes every few days. No abdominal pain. No melena, brbpr. No n/v. No dysphagia. Some leg cramps. Worried about potassium. Drinks alcohol every couple of days. Would not quantify how much, "I don't get drunk". Not sure of her medications. Will request copy from pharmacy.  Current Outpatient Prescriptions  Medication Sig Dispense Refill  . albuterol (PROVENTIL HFA;VENTOLIN HFA) 108 (90 BASE) MCG/ACT inhaler Inhale 2 puffs into the lungs every 6 (six) hours as needed for wheezing or shortness of breath.     . folic acid (FOLVITE) 1 MG tablet Take 1 tablet (1 mg total) by mouth daily. 30 tablet 3  . ipratropium-albuterol (DUONEB) 0.5-2.5 (3) MG/3ML SOLN Take 3 mLs by nebulization every 6 (six) hours as needed (for shortness of breath/wheezing).     Marland Kitchen lisinopril-hydrochlorothiazide (PRINZIDE,ZESTORETIC) 20-12.5 MG per tablet Take 1 tablet by mouth daily.    . pantoprazole (PROTONIX) 40 MG tablet Take 1 tablet (40 mg total) by mouth 2 (two) times daily before a meal. 60 tablet 3  . PHENObarbital (LUMINAL) 30 MG tablet  Take 30 mg by mouth 2 (two) times daily.    . phenytoin (DILANTIN) 100 MG ER capsule Take 100 mg by mouth 3 (three) times daily.    . solifenacin (VESICARE) 5 MG tablet Take 5 mg by mouth daily.    Marland Kitchen thiamine 100 MG tablet Take 1 tablet (100 mg total) by mouth daily. 30 tablet 3   No current facility-administered medications for this visit.     Allergies as of 06/22/2017  . (No Known Allergies)   Past Surgical History:  Procedure Laterality Date  . ABDOMINAL HYSTERECTOMY  2004   complete  . BRAIN SURGERY     age 41, stroke  . COLONOSCOPY    . COLONOSCOPY WITH PROPOFOL  10/03/2012   Dr. [name]: moderate diverticulosis, small internal hemorrhoids, TUBULAR ADENOMA. Next colonoscopy 2023 per Dr. [name].  . ESOPHAGOGASTRODUODENOSCOPY N/A 10/18/2014   Dr. Gala Romney during inpatient hospitalization: severe exudative/erosive reflux esophagitis as source of trivial upper GI bleed. No varices   . ESOPHAGOGASTRODUODENOSCOPY (EGD) WITH PROPOFOL  10/03/2012   Dr. [name]: stricture at [place] junction s/p dilation, mild gastritis negative H.pylori   . right tib-fib fracture  2008  . SAVORY DILATION  10/03/2012   Procedure: SAVORY DILATION;  Surgeon: Danie Binder, MD;  Location: [facility];  Service: Endoscopy;  Laterality: N/A;  started at 1303,  dilated 12.8-16mm    ROS:  General: Negative for anorexia, weight loss, fever, chills, fatigue, weakness. ENT: Negative for hoarseness, difficulty swallowing , nasal congestion. CV: Negative for chest pain, angina, palpitations, dyspnea on exertion, peripheral edema.  Respiratory: Negative for dyspnea at rest, dyspnea on exertion, cough, sputum, wheezing.  GI: See history of present illness. GU:  Negative for dysuria, hematuria, urinary incontinence, urinary frequency, nocturnal urination.  Endo: Negative for unusual weight change.    Physical Examination:   BP 139/81   Pulse (!) 102   Temp 97.6 F (36.4 C) (Oral)   Ht 5' 4.5" (1.638 m)   Wt 146 lb 6.4 oz  (66.4 kg)   BMI 24.74 kg/m   General: Well-nourished, well-developed in no acute distress. Patient examined in chair as she declined getting on exam table.  Eyes: No icterus. Mouth: Oropharyngeal mucosa moist and pink , no lesions erythema or exudate. Lungs: Clear to auscultation bilaterally.  Heart: Regular rate and rhythm, no murmurs rubs or gallops.  Abdomen: Bowel sounds are normal, nontender, nondistended, no hepatosplenomegaly or masses, no abdominal bruits or hernia , no rebound or guarding.   Extremities: No lower extremity edema. No clubbing. Right arm weakness. Neuro: Alert and oriented x 4   Skin: Warm and dry, no jaundice.   Psych: Alert and cooperative, normal mood and affect.  Labs:  Lab Results  Component Value Date   CREATININE 0.92 05/16/2017   BUN 11 05/16/2017   NA 134 (L) 05/16/2017   K 3.1 (L) 05/16/2017   CL 89 (L) 05/16/2017   CO2 32 (H) 05/16/2017   Lab Results  Component Value Date   ALT 16 04/27/2017   AST 46 (H) 04/27/2017   ALKPHOS 101 04/27/2017   BILITOT 0.9 04/27/2017   Lab Results  Component Value Date   INR 1.1 04/27/2017   INR 1.15 12/23/2016   INR 1.24 10/14/2015   Lab Results  Component Value Date   WBC 3.7 (L) 04/27/2017   HGB 11.1 (L) 04/27/2017   HCT 31.0 (L) 04/27/2017   MCV 90.4 04/27/2017   PLT 155 04/27/2017   Lab Results  Component Value Date   IRON 193 (H) 04/27/2017   TIBC 354 04/27/2017   FERRITIN 104 04/27/2017   . Imaging Studies: No results found.

## 2017-06-22 NOTE — Assessment & Plan Note (Signed)
Likely anemia of chronic disease. No evidence of iron deficiency. Recent I FOBT negative.

## 2017-06-22 NOTE — Assessment & Plan Note (Signed)
Add MiraLAX 1 capful daily as needed. Prescription sent to pharmacy.

## 2017-07-06 ENCOUNTER — Other Ambulatory Visit: Payer: Self-pay

## 2017-07-06 ENCOUNTER — Other Ambulatory Visit: Payer: Self-pay | Admitting: Gastroenterology

## 2017-07-06 ENCOUNTER — Telehealth: Payer: Self-pay

## 2017-07-06 DIAGNOSIS — R945 Abnormal results of liver function studies: Secondary | ICD-10-CM

## 2017-07-06 DIAGNOSIS — R7989 Other specified abnormal findings of blood chemistry: Secondary | ICD-10-CM

## 2017-07-06 DIAGNOSIS — K7460 Unspecified cirrhosis of liver: Secondary | ICD-10-CM

## 2017-07-06 DIAGNOSIS — D649 Anemia, unspecified: Secondary | ICD-10-CM

## 2017-07-06 LAB — BASIC METABOLIC PANEL WITH GFR
BUN/Creatinine Ratio: 5 (calc) — ABNORMAL LOW (ref 6–22)
BUN: 4 mg/dL — ABNORMAL LOW (ref 7–25)
CO2: 33 mmol/L — ABNORMAL HIGH (ref 20–32)
Calcium: 8.9 mg/dL (ref 8.6–10.4)
Chloride: 82 mmol/L — ABNORMAL LOW (ref 98–110)
Creat: 0.73 mg/dL (ref 0.50–0.99)
GFR, Est African American: 104 mL/min/{1.73_m2} (ref >=60)
GFR, Est Non African American: 90 mL/min/{1.73_m2} (ref >=60)
Glucose, Bld: 102 mg/dL (ref 65–139)
Potassium: 2.9 mmol/L — ABNORMAL LOW (ref 3.5–5.3)
Sodium: 128 mmol/L — ABNORMAL LOW (ref 135–146)

## 2017-07-06 MED ORDER — POTASSIUM CHLORIDE ER 10 MEQ PO TBCR: 40.0000 meq | EXTENDED_RELEASE_TABLET | Freq: Two times a day (BID) | ORAL | 0 refills | Status: DC

## 2017-07-06 NOTE — Telephone Encounter (Signed)
She was past due for the Met-7. [name], can you double check and make sure yall have the labs that I ordered for 08/2017. It would have been written on the encounter form the day of her OV. If not, she will need CBC, MET7, AFP

## 2017-07-06 NOTE — Progress Notes (Signed)
Potassium 2.9. I have not seen this patient before: I had addressed prior labs in the absence of another provider.   I am again sending in potassium 40 meq BID for 5 days, and I have attempted to call her to let her know. Please make sure she knows.  Routing to [place] as FYI and for further recommendations regarding any future medications and labs.

## 2017-07-06 NOTE — Progress Notes (Signed)
Potassium 2.9. I have not seen this patient before: I had addressed prior labs in the absence of another provider and these labs were ordered under me. Please have future labs ordered under the provider who was caring for her.   I am again sending in potassium 40 meq BID for 5 days, and I have attempted to call her to let her know. Please make sure she knows.  Routing to [place] as FYI and for further recommendations regarding any future medications and labs.

## 2017-07-06 NOTE — Telephone Encounter (Signed)
Magda Paganini, I checked the encounter form and you had the labs listed and due in 2 months. Nothing was marked off of the encounter as being done. I have ordered the labs and they are on file to do for 08/29/2017.

## 2017-07-06 NOTE — Telephone Encounter (Signed)
T/C from [place] at [place], pt is there for labs and the only thing they have is the BMP ordered for 05/24/2017.  She wanted to know if she had any labs ordered since then and I told her there was none on file. She will proceed to do the past due lab. Forwarding to Neil Crouch, PA who saw her last.  I see in the [place] note that she has ordered labs for 2 months, but they had not been entered.

## 2017-07-07 NOTE — Progress Notes (Signed)
FYI to  LL.  

## 2017-07-07 NOTE — Progress Notes (Signed)
To Leslie Lewis, PA.  

## 2017-07-07 NOTE — Progress Notes (Signed)
Pt is aware and will have [facility] deliver the medication to her.

## 2017-07-07 NOTE — Telephone Encounter (Signed)
Thanks

## 2017-07-12 ENCOUNTER — Other Ambulatory Visit: Payer: Self-pay

## 2017-07-12 DIAGNOSIS — K7460 Unspecified cirrhosis of liver: Secondary | ICD-10-CM

## 2017-07-12 DIAGNOSIS — R7989 Other specified abnormal findings of blood chemistry: Secondary | ICD-10-CM

## 2017-07-12 DIAGNOSIS — D649 Anemia, unspecified: Secondary | ICD-10-CM

## 2017-07-12 DIAGNOSIS — R945 Abnormal results of liver function studies: Secondary | ICD-10-CM

## 2017-07-31 NOTE — Progress Notes (Signed)
Records from pharmacy, [facility] Only medication recently field lisinopril/HCTZ 20/12.5 mg 1 daily, #90 filled on June 26. Phenytoin 100 mg 3 times a day 30 day supply field on June 26. 30 day supply of Vesicare and omeprazole field on 04/12/2017.

## 2017-08-02 ENCOUNTER — Encounter: Payer: Self-pay | Admitting: Orthopaedic Surgery

## 2017-08-02 ENCOUNTER — Ambulatory Visit (INDEPENDENT_AMBULATORY_CARE_PROVIDER_SITE_OTHER): Payer: Medicare Other | Admitting: Orthopaedic Surgery

## 2017-08-02 VITALS — BP 158/85 | HR 101 | Ht 64.0 in | Wt 141.0 lb

## 2017-08-02 DIAGNOSIS — M25511 Pain in right shoulder: Secondary | ICD-10-CM

## 2017-08-02 DIAGNOSIS — G8929 Other chronic pain: Secondary | ICD-10-CM

## 2017-08-02 MED ORDER — HYDROCODONE-ACETAMINOPHEN 5-325 MG PO TABS: ORAL_TABLET | ORAL | 0 refills | Status: DC

## 2017-08-02 NOTE — Progress Notes (Signed)
Patient DP:OEUMPN [name], female DOB:Jul 05, 1956, 61 y.o. TIR:[id number]  Chief Complaint  Patient presents with  . Shoulder Pain    right shoulder pain     HPI  [name] is a 61 y.o. female who has chronic right shoulder pain.  She is post right sided CVA with residual weakness.  She has no new trauma. HPI  Body mass index is 24.2 kg/m.  ROS  Review of Systems  Constitutional:       Patient does not have Diabetes Mellitus. Patient has hypertension. Patient has COPD or shortness of breath. Patient does not have BMI > 35. Patient does not have current smoking history.  Respiratory: Negative for shortness of breath.   Cardiovascular: Negative for chest pain.  Endocrine: Positive for cold intolerance.  Musculoskeletal: Positive for arthralgias, gait problem, myalgias and neck pain.  Allergic/Immunologic: Positive for environmental allergies.  Neurological: Positive for weakness (right side) and headaches.    Past Medical History:  Diagnosis Date  . Alcohol abuse   . Chronic abdominal pain   . Chronic diarrhea   . Contracture of muscle of hand    right  . HTN (hypertension)   . PUD (peptic ulcer disease)    remote  . Seizures ([place])    since stroke, no recent seizures  . Shortness of breath    with exertion  . Stroke [facility])    age 27, required brain surgery    Past Surgical History:  Procedure Laterality Date  . ABDOMINAL HYSTERECTOMY  2004   complete  . BRAIN SURGERY     age 63, stroke  . COLONOSCOPY    . COLONOSCOPY WITH PROPOFOL  10/03/2012   Dr. [name]: moderate diverticulosis, small internal hemorrhoids, TUBULAR ADENOMA. Next colonoscopy 2023 per Dr. [name].  . ESOPHAGOGASTRODUODENOSCOPY N/A 10/18/2014   Dr. Gala Romney during inpatient hospitalization: severe exudative/erosive reflux esophagitis as source of trivial upper GI bleed. No varices   . ESOPHAGOGASTRODUODENOSCOPY (EGD) WITH PROPOFOL  10/03/2012   Dr. [name]: stricture at [place] junction s/p  dilation, mild gastritis negative H.pylori   . right tib-fib fracture  2008  . SAVORY DILATION  10/03/2012   Procedure: SAVORY DILATION;  Surgeon: Danie Binder, MD;  Location: [facility];  Service: Endoscopy;  Laterality: N/A;  started at 1303,  dilated 12.8-16mm    Family History  Problem Relation Age of Onset  . Liver disease Sister        etoh  . Colon cancer Neg Hx     Social History Social History  Substance Use Topics  . Smoking status: Former Smoker    Packs/day: 0.04    Years: 9.00    Types: Cigarettes    Quit date: 07/28/2014  . Smokeless tobacco: Never Used  . Alcohol use Yes     Comment: "sometimes"    No Known Allergies  Current Outpatient Prescriptions  Medication Sig Dispense Refill  . albuterol (PROVENTIL HFA;VENTOLIN HFA) 108 (90 BASE) MCG/ACT inhaler Inhale 2 puffs into the lungs every 6 (six) hours as needed for wheezing or shortness of breath.     . folic acid (FOLVITE) 1 MG tablet Take 1 tablet (1 mg total) by mouth daily. 30 tablet 3  . ipratropium-albuterol (DUONEB) 0.5-2.5 (3) MG/3ML SOLN Take 3 mLs by nebulization every 6 (six) hours as needed (for shortness of breath/wheezing).     Marland Kitchen lisinopril-hydrochlorothiazide (PRINZIDE,ZESTORETIC) 20-12.5 MG per tablet Take 1 tablet by mouth daily.    . pantoprazole (PROTONIX) 40 MG tablet Take 1 tablet (  40 mg total) by mouth 2 (two) times daily before a meal. 60 tablet 3  . PHENObarbital (LUMINAL) 30 MG tablet Take 30 mg by mouth 2 (two) times daily.    . phenytoin (DILANTIN) 100 MG ER capsule Take 100 mg by mouth 3 (three) times daily.    . polyethylene glycol powder (GLYCOLAX/MIRALAX) powder Take one capful mixed in 4 ounces of liquid daily as needed for constipation 527 g 0  . solifenacin (VESICARE) 5 MG tablet Take 5 mg by mouth daily.    Marland Kitchen thiamine 100 MG tablet Take 1 tablet (100 mg total) by mouth daily. 30 tablet 3  . HYDROcodone-acetaminophen (NORCO/VICODIN) 5-325 MG tablet One tablet by mouth every six  hours as needed for pain.  Seven day limit per Medicaid guidelines. 28 tablet 0  . potassium chloride (K-DUR) 10 MEQ tablet Take 4 tablets (40 mEq total) by mouth 2 (two) times daily. 40 tablet 0   No current facility-administered medications for this visit.      Physical Exam  Blood pressure (!) 158/85, pulse (!) 101, height 5\' 4"  (1.626 m), weight 141 lb (64 kg).  Constitutional: overall normal hygiene, normal nutrition, well developed, normal grooming, normal body habitus. Assistive device:none  Musculoskeletal: gait and station Limp right, muscle tone and strength are normal, no tremors or atrophy is present.  .  Neurological:she has right sided weakness from CVA with deformity of hand on the right and limited use of the right hand.  Right leg weak with a limp. Sensation normal.  Cranial nerves II-XII intact.   Skin:   Normal overall no scars, lesions, ulcers or rashes. No psoriasis.  Psychiatric: Alert and oriented x 3.  Recent memory intact, remote memory unclear.  Normal mood and affect. Well groomed.  Good eye contact.  Cardiovascular: overall no swelling, no varicosities, no edema bilaterally, normal temperatures of the legs and arms, no clubbing, cyanosis and good capillary refill.  Lymphatic: palpation is normal.  All other systems reviewed and are negative   Right shoulder weakness secondary to old CVA and limited motion.  It has pain and no crepitus.  Abduction 75, forward flexion 75, internal 10, external 10, adduction 15, extension 0.  The patient has been educated about the nature of the problem(s) and counseled on treatment options.  The patient appeared to understand what I have discussed and is in agreement with it.  Encounter Diagnosis  Name Primary?  . Chronic right shoulder pain Yes    PLAN Call if any problems.  Precautions discussed.  Continue current medications.   Return to clinic 3 months   I have reviewed the [place] web site prior to prescribing narcotic medicine for this patient.  Electronically Signed Sanjuana Kava, MD 10/16/20188:25 AM

## 2017-08-22 NOTE — Progress Notes (Signed)
Should have upcoming labs this month.

## 2017-08-23 NOTE — Progress Notes (Signed)
LMOM for pt to proceed with labs ( orders have been mailed).  Call if questions.

## 2017-08-30 NOTE — Progress Notes (Signed)
Her sodium is significantly low as well as her potassium again.  Chloride low.  Worried that she is still drinking alcohol. If signs of hyponatremia, she should go to ED (severe fatigue, confusion, tremors, muscle cramps, N/V)  She needs to stop her thiazide diuretic which is part of her lisiniopril. Should ask prescribing doctor to give her lisinopril by itself without the HCTZ given her low sodium.  Needs to take KCL 43meq bid for 3 days and then 78meq bid thereafter, call in RX for KCL 69meq tablets take 2 twice a day for 3 days, then take one bid. #72 and no refills.  PLEASE ADD ON MAGNESIUM LEVEL TO LABS.  Please have her f/u with pcp in next 24 hours for multiple electrolyte abnormalities. Send copy to PCP.

## 2017-08-30 NOTE — Progress Notes (Signed)
Pt is aware of the results and recommendations. Said she is still drinking alcohol and I advised her to [place]. I called the [facility] to [place] at [facility]. I called Dr. Josephine Cables and spoke to [place] and she said the pt could come anytime tomorrow between 8:30 - 11:00 am. Pt is aware and said she will try to get transportation, not sure she can do so. Said she did not have money to pick up the medication and I asked her to try to get the money from someone, that it should not cost much. She is aware to go to the ED if any of the symptoms listed above. I have called the lab and spoke with Maudie Mercury D and had the Magnesium added. Faxing over this info and results for Dr. Legrand Rams.

## 2017-08-31 LAB — BASIC METABOLIC PANEL
BUN: 8 mg/dL (ref 7–25)
CO2: 34 mmol/L — ABNORMAL HIGH (ref 20–32)
Calcium: 8.9 mg/dL (ref 8.6–10.4)
Chloride: <80 mmol/L — ABNORMAL LOW (ref 98–110)
Creat: 0.51 mg/dL (ref 0.50–0.99)
Glucose, Bld: 122 mg/dL (ref 65–139)
Potassium: 2.8 mmol/L — ABNORMAL LOW (ref 3.5–5.3)
Sodium: 123 mmol/L — ABNORMAL LOW (ref 135–146)

## 2017-08-31 LAB — CBC WITH DIFFERENTIAL/PLATELET
Basophils Absolute: 21 cells/uL (ref 0–200)
Basophils Relative: 0.5 %
Eosinophils Absolute: 50 cells/uL (ref 15–500)
Eosinophils Relative: 1.2 %
HCT: 33.4 % — ABNORMAL LOW (ref 35.0–45.0)
Hemoglobin: 12.1 g/dL (ref 11.7–15.5)
Lymphs Abs: 1701 cells/uL (ref 850–3900)
MCH: 31.8 pg (ref 27.0–33.0)
MCHC: 36.2 g/dL — ABNORMAL HIGH (ref 32.0–36.0)
MCV: 87.9 fL (ref 80.0–100.0)
MPV: 12.1 fL (ref 7.5–12.5)
Monocytes Relative: 12.8 %
Neutro Abs: 1890 cells/uL (ref 1500–7800)
Neutrophils Relative %: 45 %
Platelets: 152 10*3/uL (ref 140–400)
RBC: 3.8 10*6/uL (ref 3.80–5.10)
RDW: 12.6 % (ref 11.0–15.0)
Total Lymphocyte: 40.5 %
WBC mixed population: 538 cells/uL (ref 200–950)
WBC: 4.2 10*3/uL (ref 3.8–10.8)

## 2017-08-31 LAB — MAGNESIUM: Magnesium: 1.8 mg/dL (ref 1.5–2.5)

## 2017-08-31 LAB — TEST AUTHORIZATION

## 2017-08-31 LAB — AFP TUMOR MARKER: AFP-Tumor Marker: 6.4 ng/mL — ABNORMAL HIGH

## 2017-08-31 NOTE — Progress Notes (Signed)
Noted  

## 2017-09-22 ENCOUNTER — Telehealth: Payer: Self-pay | Admitting: Gastroenterology

## 2017-09-22 ENCOUNTER — Encounter: Payer: Self-pay | Admitting: *Deleted

## 2017-09-22 NOTE — Telephone Encounter (Signed)
Letter mailed to patient.

## 2017-09-22 NOTE — Telephone Encounter (Signed)
RECALL FOR ABD ULTRASOUND 

## 2017-10-03 ENCOUNTER — Telehealth: Payer: Self-pay | Admitting: Gastroenterology

## 2017-10-03 ENCOUNTER — Other Ambulatory Visit: Payer: Self-pay

## 2017-10-03 DIAGNOSIS — K7030 Alcoholic cirrhosis of liver without ascites: Secondary | ICD-10-CM

## 2017-10-03 NOTE — Telephone Encounter (Signed)
US abdomen limited RUQ scheduled for 11/03/17 at 9:30am, pt to arrive at 9:15am. NPO after midnight before test. Called and informed pt. Letter mailed.

## 2017-10-03 NOTE — Telephone Encounter (Signed)
PATIENT CALLED TO SCHEDULE ULTRASOUND

## 2017-10-15 ENCOUNTER — Encounter (HOSPITAL_COMMUNITY): Payer: Self-pay | Admitting: *Deleted

## 2017-10-15 ENCOUNTER — Inpatient Hospital Stay (HOSPITAL_COMMUNITY)
Admission: EM | Admit: 2017-10-15 | Discharge: 2017-10-17 | DRG: 378 | Disposition: A | Discharge status: Home / Self Care | Payer: Medicare Other | Attending: Internal Medicine | Admitting: Internal Medicine

## 2017-10-15 DIAGNOSIS — Z8711 Personal history of peptic ulcer disease: Secondary | ICD-10-CM

## 2017-10-15 DIAGNOSIS — I10 Essential (primary) hypertension: Secondary | ICD-10-CM | POA: Diagnosis present

## 2017-10-15 DIAGNOSIS — K5900 Constipation, unspecified: Secondary | ICD-10-CM | POA: Diagnosis present

## 2017-10-15 DIAGNOSIS — F1020 Alcohol dependence, uncomplicated: Secondary | ICD-10-CM | POA: Diagnosis present

## 2017-10-15 DIAGNOSIS — R945 Abnormal results of liver function studies: Secondary | ICD-10-CM | POA: Diagnosis present

## 2017-10-15 DIAGNOSIS — K210 Gastro-esophageal reflux disease with esophagitis: Secondary | ICD-10-CM | POA: Diagnosis present

## 2017-10-15 DIAGNOSIS — M62441 Contracture of muscle, right hand: Secondary | ICD-10-CM | POA: Diagnosis not present

## 2017-10-15 DIAGNOSIS — K625 Hemorrhage of anus and rectum: Secondary | ICD-10-CM | POA: Diagnosis present

## 2017-10-15 DIAGNOSIS — D649 Anemia, unspecified: Secondary | ICD-10-CM | POA: Diagnosis not present

## 2017-10-15 DIAGNOSIS — Z90710 Acquired absence of both cervix and uterus: Secondary | ICD-10-CM | POA: Diagnosis not present

## 2017-10-15 DIAGNOSIS — K2210 Ulcer of esophagus without bleeding: Secondary | ICD-10-CM | POA: Diagnosis not present

## 2017-10-15 DIAGNOSIS — F1010 Alcohol abuse, uncomplicated: Secondary | ICD-10-CM | POA: Diagnosis present

## 2017-10-15 DIAGNOSIS — Z79891 Long term (current) use of opiate analgesic: Secondary | ICD-10-CM

## 2017-10-15 DIAGNOSIS — D62 Acute posthemorrhagic anemia: Secondary | ICD-10-CM | POA: Diagnosis not present

## 2017-10-15 DIAGNOSIS — Z87891 Personal history of nicotine dependence: Secondary | ICD-10-CM | POA: Diagnosis not present

## 2017-10-15 DIAGNOSIS — K5791 Diverticulosis of intestine, part unspecified, without perforation or abscess with bleeding: Secondary | ICD-10-CM | POA: Diagnosis not present

## 2017-10-15 DIAGNOSIS — Z8673 Personal history of transient ischemic attack (TIA), and cerebral infarction without residual deficits: Secondary | ICD-10-CM

## 2017-10-15 DIAGNOSIS — K529 Noninfective gastroenteritis and colitis, unspecified: Secondary | ICD-10-CM | POA: Diagnosis not present

## 2017-10-15 DIAGNOSIS — Z79899 Other long term (current) drug therapy: Secondary | ICD-10-CM | POA: Diagnosis not present

## 2017-10-15 DIAGNOSIS — G40909 Epilepsy, unspecified, not intractable, without status epilepticus: Secondary | ICD-10-CM | POA: Diagnosis not present

## 2017-10-15 DIAGNOSIS — K7030 Alcoholic cirrhosis of liver without ascites: Secondary | ICD-10-CM | POA: Diagnosis present

## 2017-10-15 DIAGNOSIS — K7460 Unspecified cirrhosis of liver: Secondary | ICD-10-CM | POA: Diagnosis present

## 2017-10-15 LAB — CBC
HCT: 34.1 % — ABNORMAL LOW (ref 36.0–46.0)
Hemoglobin: 11.1 g/dL — ABNORMAL LOW (ref 12.0–15.0)
MCH: 31.3 pg (ref 26.0–34.0)
MCHC: 32.6 g/dL (ref 30.0–36.0)
MCV: 96.1 fL (ref 78.0–100.0)
Platelets: 146 10*3/uL — ABNORMAL LOW (ref 150–400)
RBC: 3.55 MIL/uL — ABNORMAL LOW (ref 3.87–5.11)
RDW: 13.5 % (ref 11.5–15.5)
WBC: 5.7 10*3/uL (ref 4.0–10.5)

## 2017-10-15 LAB — COMPREHENSIVE METABOLIC PANEL
ALT: 33 U/L (ref 14–54)
AST: 75 U/L — ABNORMAL HIGH (ref 15–41)
Albumin: 4 g/dL (ref 3.5–5.0)
Alkaline Phosphatase: 117 U/L (ref 38–126)
Anion gap: 13 (ref 5–15)
BUN: 10 mg/dL (ref 6–20)
CO2: 23 mmol/L (ref 22–32)
Calcium: 9.2 mg/dL (ref 8.9–10.3)
Chloride: 102 mmol/L (ref 101–111)
Creatinine, Ser: 0.66 mg/dL (ref 0.44–1.00)
GFR calc Af Amer: >60 mL/min (ref >60)
GFR calc non Af Amer: >60 mL/min (ref >60)
Glucose, Bld: 83 mg/dL (ref 65–99)
Potassium: 3.3 mmol/L — ABNORMAL LOW (ref 3.5–5.1)
Sodium: 138 mmol/L (ref 135–145)
Total Bilirubin: 0.6 mg/dL (ref 0.3–1.2)
Total Protein: 8.8 g/dL — ABNORMAL HIGH (ref 6.5–8.1)

## 2017-10-15 LAB — TYPE AND SCREEN
ABO/RH(D): O POS
Antibody Screen: NEGATIVE

## 2017-10-15 LAB — PHENYTOIN LEVEL, TOTAL: Phenytoin Lvl: <2.5 ug/mL — ABNORMAL LOW (ref 10.0–20.0)

## 2017-10-15 LAB — PHENOBARBITAL LEVEL: Phenobarbital: <5 ug/mL — ABNORMAL LOW (ref 15.0–30.0)

## 2017-10-15 LAB — POC OCCULT BLOOD, ED: Fecal Occult Bld: POSITIVE — AB

## 2017-10-15 MED ORDER — PHENOBARBITAL 30 MG PO TABS: 30.0000 mg | ORAL_TABLET | Freq: Two times a day (BID) | ORAL | Status: DC

## 2017-10-15 MED ORDER — VITAMIN B-1 100 MG PO TABS
100.0000 mg | ORAL_TABLET | Freq: Every day | ORAL | Status: DC
  Administered 2017-10-16 – 2017-10-17 (×2): 100 mg via ORAL
  Filled 2017-10-15 (×2): qty 1

## 2017-10-15 MED ORDER — ONDANSETRON HCL 4 MG PO TABS: 4.0000 mg | ORAL_TABLET | Freq: Four times a day (QID) | ORAL | Status: DC | PRN

## 2017-10-15 MED ORDER — ACETAMINOPHEN 650 MG RE SUPP: 650.0000 mg | Freq: Four times a day (QID) | RECTAL | Status: DC | PRN

## 2017-10-15 MED ORDER — IPRATROPIUM-ALBUTEROL 0.5-2.5 (3) MG/3ML IN SOLN: 3.0000 mL | Freq: Four times a day (QID) | RESPIRATORY_TRACT | Status: DC | PRN

## 2017-10-15 MED ORDER — POTASSIUM CHLORIDE IN NACL 20-0.9 MEQ/L-% IV SOLN
INTRAVENOUS | Status: AC
  Administered 2017-10-16: 02:00:00 via INTRAVENOUS
  Filled 2017-10-15: qty 1000

## 2017-10-15 MED ORDER — PHENYTOIN SODIUM EXTENDED 100 MG PO CAPS
100.0000 mg | ORAL_CAPSULE | Freq: Three times a day (TID) | ORAL | Status: DC
  Administered 2017-10-16 – 2017-10-17 (×5): 100 mg via ORAL
  Filled 2017-10-15 (×5): qty 1

## 2017-10-15 MED ORDER — PANTOPRAZOLE SODIUM 40 MG IV SOLR
40.0000 mg | Freq: Two times a day (BID) | INTRAVENOUS | Status: DC
  Administered 2017-10-16 (×3): 40 mg via INTRAVENOUS
  Filled 2017-10-15 (×3): qty 40

## 2017-10-15 MED ORDER — ONDANSETRON HCL 4 MG/2ML IJ SOLN: 4.0000 mg | Freq: Four times a day (QID) | INTRAMUSCULAR | Status: DC | PRN

## 2017-10-15 MED ORDER — HYDROCODONE-ACETAMINOPHEN 5-325 MG PO TABS: 1.0000 | ORAL_TABLET | ORAL | Status: DC | PRN

## 2017-10-15 MED ORDER — SODIUM CHLORIDE 0.9 % IV BOLUS (SEPSIS)
1000.0000 mL | Freq: Once | INTRAVENOUS | Status: AC
  Administered 2017-10-16: 1000 mL via INTRAVENOUS

## 2017-10-15 MED ORDER — FOLIC ACID 1 MG PO TABS
1.0000 mg | ORAL_TABLET | Freq: Every day | ORAL | Status: DC
Start: 2017-10-16 — End: 2017-10-17
  Administered 2017-10-16 – 2017-10-17 (×2): 1 mg via ORAL
  Filled 2017-10-15 (×2): qty 1

## 2017-10-15 MED ORDER — LORAZEPAM 1 MG PO TABS
0.0000 mg | ORAL_TABLET | Freq: Two times a day (BID) | ORAL | Status: DC
Start: 2017-10-18 — End: 2017-10-17

## 2017-10-15 MED ORDER — ACETAMINOPHEN 325 MG PO TABS: 650.0000 mg | ORAL_TABLET | Freq: Four times a day (QID) | ORAL | Status: DC | PRN

## 2017-10-15 MED ORDER — LORAZEPAM 1 MG PO TABS: 0.0000 mg | ORAL_TABLET | Freq: Four times a day (QID) | ORAL | Status: DC

## 2017-10-15 MED ORDER — SODIUM CHLORIDE 0.9% FLUSH
3.0000 mL | Freq: Two times a day (BID) | INTRAVENOUS | Status: DC
  Administered 2017-10-16 (×3): 3 mL via INTRAVENOUS

## 2017-10-15 MED ORDER — DARIFENACIN HYDROBROMIDE ER 7.5 MG PO TB24
7.5000 mg | ORAL_TABLET | Freq: Every day | ORAL | Status: DC
  Administered 2017-10-16 – 2017-10-17 (×2): 7.5 mg via ORAL
  Filled 2017-10-15 (×3): qty 1

## 2017-10-15 NOTE — H&P (Signed)
History and Physical    [name] QQV:[id number] DOB: 01/07/1956 DOA: 10/15/2017  PCP: [name], MD   Patient coming from: Home  Chief Complaint: Rectal bleeding   HPI: [name] is a 61 y.o. female with medical history significant for alcohol dependence, liver cirrhosis, seizure disorder, hypertension, and history of upper GI bleeds with erosive esophagitis but no varices on EGD from 3 years ago, now presenting to the ED for evaluation of rectal bleeding.  She reports noting some red blood per rectum yesterday, and then 4 episodes today.  There is some abdominal discomfort associated with this, but no nausea or vomiting.  Abdominal discomfort is described as cramping, localized to the right lower quadrant, and without alleviating or exacerbating factors.  Denies NSAIDs but continues to drink alcohol daily.  ED Course: Upon arrival to the ED, patient is found to be afebrile, saturating well on room air, slightly tachycardic, and with initial blood pressure 94/52.  Chemistry panel is notable for a potassium of 3.3 and AST 75.  CBC features a normocytic anemia with hemoglobin of 11.1, down from 12.18 last month.  Fecal occult blood testing is positive.  Type and screen was performed.  Blood pressure improved spontaneously and the patient has not been in any apparent respiratory distress.  She will be admitted to the telemetry unit for ongoing evaluation and management of suspected lower GI bleed.  Review of Systems:  All other systems reviewed and apart from HPI, are negative.  Past Medical History:  Diagnosis Date  . Alcohol abuse   . Chronic abdominal pain   . Chronic diarrhea   . Contracture of muscle of hand    right  . HTN (hypertension)   . PUD (peptic ulcer disease)    remote  . Seizures ([place])    since stroke, no recent seizures  . Shortness of breath    with exertion  . 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 ABDOMINAL HYSTERECTOMY  2004   complete  . BRAIN SURGERY     age 41, stroke  . COLONOSCOPY    . COLONOSCOPY WITH PROPOFOL  10/03/2012   Dr. [name]: moderate diverticulosis, small internal hemorrhoids, TUBULAR ADENOMA. Next colonoscopy 2023 per Dr. [name].  . ESOPHAGOGASTRODUODENOSCOPY N/A 10/18/2014   Dr. Gala Romney during inpatient hospitalization: severe exudative/erosive reflux esophagitis as source of trivial upper GI bleed. No varices   . ESOPHAGOGASTRODUODENOSCOPY (EGD) WITH PROPOFOL  10/03/2012   Dr. [name]: stricture at [place] junction s/p dilation, mild gastritis negative H.pylori   . right tib-fib fracture  2008  . SAVORY DILATION  10/03/2012   Procedure: SAVORY DILATION;  Surgeon: Danie Binder, MD;  Location: [facility];  Service: Endoscopy;  Laterality: N/A;  started at 1303,  dilated 12.8-16mm     reports that she quit smoking about 3 years ago. Her smoking use included cigarettes. She has a 0.36 pack-year smoking history. she has never used smokeless tobacco. She reports that she drinks alcohol. She reports that she does not use drugs.  No Known Allergies  Family History  Problem Relation Age of Onset  . Liver disease Sister        etoh  . Colon cancer Neg Hx      Prior to Admission medications   Medication Sig Start Date End Date Taking? Authorizing Provider  albuterol (PROVENTIL HFA;VENTOLIN HFA) 108 (90 BASE) MCG/ACT inhaler Inhale 2 puffs into  the lungs every 6 (six) hours as needed for wheezing or shortness of breath.    Yes [provider]  folic acid (FOLVITE) 1 MG tablet Take 1 tablet (1 mg total) by mouth daily. 08/02/16  Yes [name], MD  HYDROcodone-acetaminophen (NORCO/VICODIN) 5-325 MG tablet One tablet by mouth every six hours as needed for pain.  Seven day limit per Medicaid guidelines. 08/02/17  Yes Sanjuana Kava, MD  ipratropium-albuterol (DUONEB) 0.5-2.5 (3) MG/3ML SOLN Take 3 mLs by nebulization every 6 (six) hours as needed  (for shortness of breath/wheezing).    Yes [provider]  lisinopril-hydrochlorothiazide (PRINZIDE,ZESTORETIC) 20-12.5 MG per tablet Take 1 tablet by mouth daily.   Yes [provider]  pantoprazole (PROTONIX) 40 MG tablet Take 1 tablet (40 mg total) by mouth 2 (two) times daily before a meal. 12/26/16  Yes Fanta, Tesfaye, MD  PHENObarbital (LUMINAL) 30 MG tablet Take 30 mg by mouth 2 (two) times daily.   Yes [provider]  phenytoin (DILANTIN) 100 MG ER capsule Take 100 mg by mouth 3 (three) times daily.   Yes [provider]  polyethylene glycol powder (GLYCOLAX/MIRALAX) powder Take one capful mixed in 4 ounces of liquid daily as needed for constipation 06/22/17  Yes [name], PA-C  potassium chloride (K-DUR) 10 MEQ tablet Take 4 tablets (40 mEq total) by mouth 2 (two) times daily. 07/06/17 10/15/17 Yes Annitta Needs, NP  solifenacin (VESICARE) 5 MG tablet Take 5 mg by mouth daily.   Yes [provider]  thiamine 100 MG tablet Take 1 tablet (100 mg total) by mouth daily. 08/02/16  Yes [name], MD    Physical Exam: Vitals:   10/15/17 2128 10/15/17 2204  BP: (!) 94/52 (!) 110/52  Pulse:  (!) 102  Resp: 18 17  Temp: 98.6 F (37 C)   TempSrc: Oral   SpO2: 100% 98%  Weight: 64 kg (141 lb)       Constitutional: NAD, calm, comfortable Eyes: PERTLA, lids and conjunctivae normal ENMT: Mucous membranes are moist. Posterior pharynx clear of any exudate or lesions.   Neck: normal, supple, no masses, no thyromegaly Respiratory: clear to auscultation bilaterally, no wheezing, no crackles. Normal respiratory effort.  Cardiovascular: S1 & S2 heard, regular rate and rhythm. No significant JVD. Abdomen: No distension, soft. Mild LLQ tenderness. No rebound pain or guarding. Bowel sounds active.  Musculoskeletal: no clubbing / cyanosis. No joint deformity upper and lower extremities.   Skin: no significant rashes, lesions, ulcers. Warm, dry,  well-perfused. Neurologic: CN 2-12 grossly intact. Sensation intact. Strength 5/5 in all 4 limbs.  Psychiatric: Alert and oriented x 3. Calm, cooperative.     Labs on Admission: I have personally reviewed following labs and imaging studies  CBC: Recent Labs  Lab 10/15/17 2230  WBC 5.7  HGB 11.1*  HCT 34.1*  MCV 96.1  PLT 950*   Basic Metabolic Panel: Recent Labs  Lab 10/15/17 2230  NA 138  K 3.3*  CL 102  CO2 23  GLUCOSE 83  BUN 10  CREATININE 0.66  CALCIUM 9.2   GFR: Estimated Creatinine Clearance: 64.6 mL/min (by C-G formula based on SCr of 0.66 mg/dL). Liver Function Tests: Recent Labs  Lab 10/15/17 2230  AST 75*  ALT 33  ALKPHOS 117  BILITOT 0.6  PROT 8.8*  ALBUMIN 4.0   No results for input(s): LIPASE, AMYLASE in the last 168 hours. No results for input(s): AMMONIA in the last 168 hours. Coagulation Profile: No  results for input(s): INR, PROTIME in the last 168 hours. Cardiac Enzymes: No results for input(s): CKTOTAL, CKMB, CKMBINDEX, TROPONINI in the last 168 hours. BNP (last 3 results) No results for input(s): PROBNP in the last 8760 hours. HbA1C: No results for input(s): HGBA1C in the last 72 hours. CBG: No results for input(s): GLUCAP in the last 168 hours. Lipid Profile: No results for input(s): CHOL, HDL, LDLCALC, TRIG, CHOLHDL, LDLDIRECT in the last 72 hours. Thyroid Function Tests: No results for input(s): TSH, T4TOTAL, FREET4, T3FREE, THYROIDAB in the last 72 hours. Anemia Panel: No results for input(s): VITAMINB12, FOLATE, FERRITIN, TIBC, IRON, RETICCTPCT in the last 72 hours. Urine analysis:    Component Value Date/Time   COLORURINE STRAW (A) 03/02/2017 0303   APPEARANCEUR CLEAR 03/02/2017 0303   LABSPEC 1.004 (L) 03/02/2017 0303   PHURINE 5.0 03/02/2017 0303   GLUCOSEU NEGATIVE 03/02/2017 0303   HGBUR SMALL (A) 03/02/2017 0303   BILIRUBINUR NEGATIVE 03/02/2017 0303   KETONESUR NEGATIVE 03/02/2017 0303   PROTEINUR NEGATIVE  03/02/2017 0303   UROBILINOGEN 0.2 12/26/2014 1927   NITRITE NEGATIVE 03/02/2017 0303   LEUKOCYTESUR NEGATIVE 03/02/2017 0303   Sepsis Labs: @LABRCNTIP (procalcitonin:4,lacticidven:4) )No results found for this or any previous visit (from the past 240 hour(s)).   Radiological Exams on Admission: No results found.  EKG: Not performed.   Assessment/Plan  1. Rectal bleeding; normocytic anemia   - Presents with several episodes of red rectal bleeding  - Slightly tachycardic with SBP in 90's initially, improved prior to IVF  - Hgb is 11.1 on admission, down from 12.1 last month  - No vomiting; has cirrhosis, but no varices on EGD from January 2016 - Check INR, start IVF, IV PPI q12h, type & screen, follow serial H&H    2. Cirrhosis  - Appears well-compensated  - No varices on EGD from January 2016  - Counseled regarding abstinence from alcohol   3. GERD  - Erosive esophagitis on EGD 3 yrs ago  - Managed with BID Protonix at home, continued with IV Protonix as above    4. Seizure disorder  - Continue suppressive phenobarbital and phenytoin    5. Alcohol dependence  - No signs of withdrawal on admission  - Monitor with CIWA and prn Ativan - Supplement vitamins and minerals    6. Hypertension  - BP at goal  - Lisinopril and HCTZ held initially in light of GIB     DVT prophylaxis: SCD's  Code Status: Full  Family Communication: Discussed with patient Disposition Plan: Admit to telemetry  Consults called: None Admission status: Inpatient    Vianne Bulls, MD [facility] Pager [phone]  If 7PM-7AM, please contact night-coverage [email or web address] Password [facility]  10/15/2017, 11:56 PM

## 2017-10-15 NOTE — ED Provider Notes (Signed)
[facility] EMERGENCY DEPARTMENT Provider Note   CSN: [id number] Arrival date & time: 10/15/17  2107     History   Chief Complaint Chief Complaint  Patient presents with  . Rectal Bleeding    HPI [name] is a 61 y.o. female.  HPI Patient presents with rectal bleeding.  Reportedly started yesterday.  States she is been having red blood come out.  Also right-sided lower abdominal pain.  States she has also had some bloody noses over the last week.  She drinks around 2 beers a day.  Has previous history of GI bleeds and esophagitis.  Has had some alcoholic cirrhosis also.  No previous varices.  States she feels lightheaded. Past Medical History:  Diagnosis Date  . Alcohol abuse   . Chronic abdominal pain   . Chronic diarrhea   . Contracture of muscle of hand    right  . HTN (hypertension)   . PUD (peptic ulcer disease)    remote  . Seizures ([place])    since stroke, no recent seizures  . Shortness of breath    with exertion  . Stroke [facility])    age 39, required brain surgery    Patient Active Problem List   Diagnosis Date Noted  . Constipation 06/22/2017  . Cirrhosis of liver without ascites ([place]) 04/21/2017  . UGI bleed 12/24/2016  . Erosive esophagitis   . AKI (acute kidney injury) ([place]) 07/30/2016  . Anemia 11/25/2015  . GIB (gastrointestinal bleeding) 10/16/2015  . Abnormal LFTs   . Hematemesis with nausea   . ETOH abuse   . Thrombocytopenia ([place])   . GI bleed 10/14/2015  . Upper GI bleed 10/18/2014  . Hematemesis 10/18/2014  . Reflux esophagitis   . Alcohol intoxication ([place]) 10/10/2014  . Sinus tachycardia ([place]) 10/10/2014  . Hypokalemia 10/10/2014  . Seizure disorder ([place]) 10/10/2014  . History of rectal bleeding 09/05/2012  . Chronic diarrhea 09/05/2012  . Alcohol abuse 09/05/2012  . Vomiting 09/05/2012  . Esophageal dysphagia 09/05/2012  . GERD (gastroesophageal reflux disease) 09/05/2012  . Upper abdominal pain 09/05/2012  . FX CLOSED  TIBIA NOS 07/27/2007  . STROKE-With also h/o Aneurysm clipping? 07/25/2007  . SEIZURES 07/25/2007  . History of cardiovascular disorder 07/25/2007    Past Surgical History:  Procedure Laterality Date  . ABDOMINAL HYSTERECTOMY  2004   complete  . BRAIN SURGERY     age 44, stroke  . COLONOSCOPY    . COLONOSCOPY WITH PROPOFOL  10/03/2012   Dr. [name]: moderate diverticulosis, small internal hemorrhoids, TUBULAR ADENOMA. Next colonoscopy 2023 per Dr. [name].  . ESOPHAGOGASTRODUODENOSCOPY N/A 10/18/2014   Dr. Gala Romney during inpatient hospitalization: severe exudative/erosive reflux esophagitis as source of trivial upper GI bleed. No varices   . ESOPHAGOGASTRODUODENOSCOPY (EGD) WITH PROPOFOL  10/03/2012   Dr. [name]: stricture at [place] junction s/p dilation, mild gastritis negative H.pylori   . right tib-fib fracture  2008  . SAVORY DILATION  10/03/2012   Procedure: SAVORY DILATION;  Surgeon: Danie Binder, MD;  Location: [facility];  Service: Endoscopy;  Laterality: N/A;  started at 1303,  dilated 12.8-16mm    OB History    Gravida Para Term Preterm AB Living   4 3 3   1 3    SAB TAB Ectopic Multiple Live Births   1               Home Medications    Prior to Admission medications   Medication Sig Start Date End Date Taking?  Authorizing Provider  albuterol (PROVENTIL HFA;VENTOLIN HFA) 108 (90 BASE) MCG/ACT inhaler Inhale 2 puffs into the lungs every 6 (six) hours as needed for wheezing or shortness of breath.    Yes [provider]  folic acid (FOLVITE) 1 MG tablet Take 1 tablet (1 mg total) by mouth daily. 08/02/16  Yes [name], MD  HYDROcodone-acetaminophen (NORCO/VICODIN) 5-325 MG tablet One tablet by mouth every six hours as needed for pain.  Seven day limit per Medicaid guidelines. 08/02/17  Yes Sanjuana Kava, MD  ipratropium-albuterol (DUONEB) 0.5-2.5 (3) MG/3ML SOLN Take 3 mLs by nebulization every 6 (six) hours as needed (for shortness of breath/wheezing).    Yes  [provider]  lisinopril-hydrochlorothiazide (PRINZIDE,ZESTORETIC) 20-12.5 MG per tablet Take 1 tablet by mouth daily.   Yes [provider]  pantoprazole (PROTONIX) 40 MG tablet Take 1 tablet (40 mg total) by mouth 2 (two) times daily before a meal. 12/26/16  Yes Fanta, Tesfaye, MD  PHENObarbital (LUMINAL) 30 MG tablet Take 30 mg by mouth 2 (two) times daily.   Yes [provider]  phenytoin (DILANTIN) 100 MG ER capsule Take 100 mg by mouth 3 (three) times daily.   Yes [provider]  polyethylene glycol powder (GLYCOLAX/MIRALAX) powder Take one capful mixed in 4 ounces of liquid daily as needed for constipation 06/22/17  Yes [name], PA-C  potassium chloride (K-DUR) 10 MEQ tablet Take 4 tablets (40 mEq total) by mouth 2 (two) times daily. 07/06/17 10/15/17 Yes Annitta Needs, NP  solifenacin (VESICARE) 5 MG tablet Take 5 mg by mouth daily.   Yes [provider]  thiamine 100 MG tablet Take 1 tablet (100 mg total) by mouth daily. 08/02/16  Yes [name], MD    Family History Family History  Problem Relation Age of Onset  . Liver disease Sister        etoh  . Colon cancer Neg Hx     Social History Social History   Tobacco Use  . Smoking status: Former Smoker    Packs/day: 0.04    Years: 9.00    Pack years: 0.36    Types: Cigarettes    Last attempt to quit: 07/28/2014    Years since quitting: 3.2  . Smokeless tobacco: Never Used  Substance Use Topics  . Alcohol use: Yes    Comment: "sometimes"  . Drug use: No     Allergies   Patient has no known allergies.   Review of Systems Review of Systems  Constitutional: Positive for appetite change. Negative for fever.  HENT: Positive for nosebleeds. Negative for congestion.   Respiratory: Negative for shortness of breath.   Gastrointestinal: Positive for abdominal pain, anal bleeding and blood in stool. Negative for nausea and vomiting.  Genitourinary: Negative for  enuresis.  Musculoskeletal: Negative for back pain.  Skin: Negative for rash.  Neurological: Positive for light-headedness.  Hematological: Bruises/bleeds easily.  Psychiatric/Behavioral: Negative for confusion.     Physical Exam Updated Vital Signs BP (!) 110/52   Pulse (!) 102   Temp 98.6 F (37 C) (Oral)   Resp 17   Wt 64 kg (141 lb)   SpO2 98%   BMI 24.20 kg/m   Physical Exam  Constitutional: She appears well-developed.  HENT:  Head: Normocephalic.  Eyes: EOM are normal.  Neck: Neck supple.  Cardiovascular: Normal rate.  Pulmonary/Chest: Effort normal. She has no wheezes. She has no rales.  Abdominal: There is tenderness.  Right lower quadrant tenderness without rebound  or guarding.  Musculoskeletal: She exhibits edema.  Mild bilateral lower extremity pitting edema.  Neurological: She is alert.  Skin: Skin is warm. Capillary refill takes less than 2 seconds.     ED Treatments / Results  Labs (all labs ordered are listed, but only abnormal results are displayed) Labs Reviewed  COMPREHENSIVE METABOLIC PANEL - Abnormal; Notable for the following components:      Result Value   Potassium 3.3 (*)    Total Protein 8.8 (*)    AST 75 (*)    All other components within normal limits  CBC - Abnormal; Notable for the following components:   RBC 3.55 (*)    Hemoglobin 11.1 (*)    HCT 34.1 (*)    Platelets 146 (*)    All other components within normal limits  PHENYTOIN LEVEL, TOTAL - Abnormal; Notable for the following components:   Phenytoin Lvl <2.5 (*)    All other components within normal limits  PHENOBARBITAL LEVEL - Abnormal; Notable for the following components:   Phenobarbital <5.0 (*)    All other components within normal limits  POC OCCULT BLOOD, ED - Abnormal; Notable for the following components:   Fecal Occult Bld POSITIVE (*)    All other components within normal limits  TYPE AND SCREEN    EKG  EKG Interpretation None       Radiology No  results found.  Procedures Procedures (including critical care time)  Medications Ordered in ED Medications - No data to display   Initial Impression / Assessment and Plan / ED Course  I have reviewed the triage vital signs and the nursing notes.  Pertinent labs & imaging results that were available during my care of the patient were reviewed by me and considered in my medical decision making (see chart for details).     Patient with rectal bleeding.  Has had previous GI bleeds.  Early cirrhosis due to alcohol.  Patient has continued to drink alcohol.  Labs overall reassuring but is having frank red blood rectally.  No history of varices.  Will admit to hospitalist for further evaluation and treatment.  Final Clinical Impressions(s) / ED Diagnoses   Final diagnoses:  Rectal bleeding    ED Discharge Orders    None       Davonna Belling, MD 10/15/17 2335

## 2017-10-15 NOTE — ED Triage Notes (Signed)
Arrives via EMS. Pt reports rectal bleeding x4 since this morning. Reports lower abdominal pain.

## 2017-10-16 ENCOUNTER — Other Ambulatory Visit: Payer: Self-pay

## 2017-10-16 DIAGNOSIS — R945 Abnormal results of liver function studies: Secondary | ICD-10-CM

## 2017-10-16 DIAGNOSIS — I10 Essential (primary) hypertension: Secondary | ICD-10-CM | POA: Diagnosis not present

## 2017-10-16 DIAGNOSIS — Z90710 Acquired absence of both cervix and uterus: Secondary | ICD-10-CM | POA: Diagnosis not present

## 2017-10-16 DIAGNOSIS — K922 Gastrointestinal hemorrhage, unspecified: Secondary | ICD-10-CM

## 2017-10-16 DIAGNOSIS — K7030 Alcoholic cirrhosis of liver without ascites: Secondary | ICD-10-CM

## 2017-10-16 DIAGNOSIS — Z8711 Personal history of peptic ulcer disease: Secondary | ICD-10-CM | POA: Diagnosis not present

## 2017-10-16 DIAGNOSIS — F1010 Alcohol abuse, uncomplicated: Secondary | ICD-10-CM

## 2017-10-16 DIAGNOSIS — Z8673 Personal history of transient ischemic attack (TIA), and cerebral infarction without residual deficits: Secondary | ICD-10-CM | POA: Diagnosis not present

## 2017-10-16 DIAGNOSIS — D62 Acute posthemorrhagic anemia: Secondary | ICD-10-CM | POA: Diagnosis not present

## 2017-10-16 DIAGNOSIS — K529 Noninfective gastroenteritis and colitis, unspecified: Secondary | ICD-10-CM | POA: Diagnosis not present

## 2017-10-16 DIAGNOSIS — F1020 Alcohol dependence, uncomplicated: Secondary | ICD-10-CM | POA: Diagnosis not present

## 2017-10-16 DIAGNOSIS — K210 Gastro-esophageal reflux disease with esophagitis: Secondary | ICD-10-CM | POA: Diagnosis not present

## 2017-10-16 DIAGNOSIS — D649 Anemia, unspecified: Secondary | ICD-10-CM | POA: Diagnosis not present

## 2017-10-16 DIAGNOSIS — K5791 Diverticulosis of intestine, part unspecified, without perforation or abscess with bleeding: Secondary | ICD-10-CM | POA: Diagnosis not present

## 2017-10-16 DIAGNOSIS — K625 Hemorrhage of anus and rectum: Secondary | ICD-10-CM | POA: Diagnosis present

## 2017-10-16 DIAGNOSIS — Z79891 Long term (current) use of opiate analgesic: Secondary | ICD-10-CM | POA: Diagnosis not present

## 2017-10-16 DIAGNOSIS — Z87891 Personal history of nicotine dependence: Secondary | ICD-10-CM | POA: Diagnosis not present

## 2017-10-16 DIAGNOSIS — Z79899 Other long term (current) drug therapy: Secondary | ICD-10-CM | POA: Diagnosis not present

## 2017-10-16 DIAGNOSIS — G40909 Epilepsy, unspecified, not intractable, without status epilepticus: Secondary | ICD-10-CM | POA: Diagnosis not present

## 2017-10-16 DIAGNOSIS — M62441 Contracture of muscle, right hand: Secondary | ICD-10-CM | POA: Diagnosis not present

## 2017-10-16 DIAGNOSIS — K5900 Constipation, unspecified: Secondary | ICD-10-CM | POA: Diagnosis not present

## 2017-10-16 LAB — COMPREHENSIVE METABOLIC PANEL
ALT: 26 U/L (ref 14–54)
AST: 59 U/L — ABNORMAL HIGH (ref 15–41)
Albumin: 3.4 g/dL — ABNORMAL LOW (ref 3.5–5.0)
Alkaline Phosphatase: 105 U/L (ref 38–126)
Anion gap: 11 (ref 5–15)
BUN: 8 mg/dL (ref 6–20)
CO2: 19 mmol/L — ABNORMAL LOW (ref 22–32)
Calcium: 8.7 mg/dL — ABNORMAL LOW (ref 8.9–10.3)
Chloride: 112 mmol/L — ABNORMAL HIGH (ref 101–111)
Creatinine, Ser: 0.48 mg/dL (ref 0.44–1.00)
GFR calc Af Amer: >60 mL/min (ref >60)
GFR calc non Af Amer: >60 mL/min (ref >60)
Glucose, Bld: 97 mg/dL (ref 65–99)
Potassium: 4.2 mmol/L (ref 3.5–5.1)
Sodium: 142 mmol/L (ref 135–145)
Total Bilirubin: 0.6 mg/dL (ref 0.3–1.2)
Total Protein: 7.6 g/dL (ref 6.5–8.1)

## 2017-10-16 LAB — CBG MONITORING, ED: Glucose-Capillary: 97 mg/dL (ref 65–99)

## 2017-10-16 LAB — PROTIME-INR
INR: 1.03
Prothrombin Time: 13.4 seconds (ref 11.4–15.2)

## 2017-10-16 LAB — HEMATOCRIT
HCT: 32 % — ABNORMAL LOW (ref 36.0–46.0)
HCT: 32.9 % — ABNORMAL LOW (ref 36.0–46.0)

## 2017-10-16 LAB — HEMOGLOBIN
Hemoglobin: 10.5 g/dL — ABNORMAL LOW (ref 12.0–15.0)
Hemoglobin: 10.8 g/dL — ABNORMAL LOW (ref 12.0–15.0)

## 2017-10-16 MED ORDER — PHENOBARBITAL 32.4 MG PO TABS
32.4000 mg | ORAL_TABLET | Freq: Two times a day (BID) | ORAL | Status: DC
  Administered 2017-10-16 – 2017-10-17 (×3): 32.4 mg via ORAL
  Filled 2017-10-16 (×4): qty 1

## 2017-10-16 MED ORDER — SODIUM CHLORIDE 0.9 % IV SOLN
INTRAVENOUS | Status: AC
  Administered 2017-10-16 (×2): via INTRAVENOUS

## 2017-10-16 NOTE — ED Notes (Signed)
Report given to [facility] 

## 2017-10-16 NOTE — ED Notes (Signed)
Nurse from [facility] called stating she did not get report from nurse.  Attempted to locate pt's primary nurse from [facility] but she is currently with critical patient.  Attempted to give nurse report and was disconnected.  Will attempt to call back.

## 2017-10-16 NOTE — Progress Notes (Deleted)
Type and cross not needed at this time  per Dr. Loletha Carrow GI

## 2017-10-16 NOTE — Consult Note (Addendum)
[facility] Consult Note   History [name] MRN # [id number]  Date of Admission: 10/15/2017 Date of Consultation: 10/16/2017 Referring physician: Dr. Caren Griffins, MD Primary Care Provider: [name], MD Primary Gastroenterologist: Barney Drain, MD at [facility]   Reason for Consultation/Chief Complaint: Hematochezia  Subjective  HPI:  This is a 61 year old woman who presented to [facility] last evening with about 24 hours of hematochezia. She was transferred here last evening because there was no GI coverage available this weekend at [facility].  She is a poor historian, but seems to describe the bleeding as maroon blood. She was found to be anemic and heme positive at the time of presentation, blood pressure was low but has since normalized. Her most recent hemoglobins of been stable and she has not required transfusion. It is not clear if she has ever had a history of GI bleeding. Upper endoscopy in 2016 showed severe esophagitis presumed to be related to alcohol abuse. Colonoscopy in December 2013 done for diarrhea showed pandiverticulosis. Normal colon and ileal mucosa.  The patient denies abdominal pain, chest pain or dyspnea.  ROS:   All other systems are negative except as noted above in the HPI  Past Medical History Past Medical History:  Diagnosis Date  . Alcohol abuse   . Chronic abdominal pain   . Chronic diarrhea   . Contracture of muscle of hand    right  . HTN (hypertension)   . PUD (peptic ulcer disease)    remote  . Seizures ([place])    since stroke, no recent seizures  . Shortness of breath    with exertion  . 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 ABDOMINAL HYSTERECTOMY  2004   complete  . BRAIN SURGERY     age 34, stroke  . COLONOSCOPY    . COLONOSCOPY WITH PROPOFOL  10/03/2012   Dr. [name]: moderate  diverticulosis, small internal hemorrhoids, TUBULAR ADENOMA. Next colonoscopy 2023 per Dr. [name].  . ESOPHAGOGASTRODUODENOSCOPY N/A 10/18/2014   Dr. Gala Romney during inpatient hospitalization: severe exudative/erosive reflux esophagitis as source of trivial upper GI bleed. No varices   . ESOPHAGOGASTRODUODENOSCOPY (EGD) WITH PROPOFOL  10/03/2012   Dr. [name]: stricture at [place] junction s/p dilation, mild gastritis negative H.pylori   . right tib-fib fracture  2008  . SAVORY DILATION  10/03/2012   Procedure: SAVORY DILATION;  Surgeon: Danie Binder, MD;  Location: [facility] ORS;  Service: Endoscopy;  Laterality: N/A;  started at 1303,  dilated 12.8-16mm    Family History Family History  Problem Relation Age of Onset  . Liver disease Sister        etoh  . Colon cancer Neg Hx     Social History Social History   Socioeconomic History  . Marital status: Single    Spouse name: None  . Number of children: 3  . Years of education: None  . Highest education level: None  Social Needs  . Financial resource strain: None  . Food insecurity - worry: None  . Food insecurity - inability: None  . Transportation needs - medical: None  . Transportation needs - non-medical: None  Occupational History  . Occupation: disability    Employer: UNEMPLOYED  Tobacco Use  . Smoking status: Former Smoker    Packs/day: 0.04    Years: 9.00    Pack years: 0.36  Types: Cigarettes    Last attempt to quit: 07/28/2014    Years since quitting: 3.2  . Smokeless tobacco: Never Used  Substance and Sexual Activity  . Alcohol use: Yes    Comment: "sometimes"  . Drug use: No  . Sexual activity: None  Other Topics Concern  . None  Social History Narrative  . None    Allergies No Known Allergies  Outpatient Meds Home medications from the H+P and/or nursing med reconciliation reviewed.  Inpatient med list reviewed  _____________________________________________________________________ Objective    Exam:  Current vital signs  Patient Vitals for the past 8 hrs:  BP Temp Temp src Pulse Resp SpO2 Height Weight  10/16/17 1359 (!) 154/68 98.4 F (36.9 C) Oral 83 16 100 % 5\' 4"  (1.626 m) 146 lb 11.2 oz (66.5 kg)  10/16/17 1230 (!) 145/73 - - 78 17 100 % 5\' 4"  (1.626 m) 146 lb 11.2 oz (66.5 kg)  10/16/17 1200 (!) 148/78 - - 80 19 100 % - -  10/16/17 1140 (!) 142/77 - - 82 17 99 % - -  10/16/17 1130 (!) 154/73 - - 88 17 100 % - -  10/16/17 1100 (!) 142/77 - - 82 15 100 % - -  10/16/17 1030 136/88 - - 83 20 100 % - -  10/16/17 1000 (!) 148/76 - - 85 17 100 % - -  10/16/17 0930 121/66 - - 80 13 100 % - -  10/16/17 0900 140/75 - - 82 20 100 % - -  10/16/17 0830 (!) 143/72 - - 85 18 100 % - -    Intake/Output Summary (Last 24 hours) at 10/16/2017 1618 Last data filed at 10/16/2017 1548 Gross per 24 hour  Intake 1085.83 ml  Output 200 ml  Net 885.83 ml    Physical Exam:  She has a chronic contracture of the right hand and arm  General: this is a chronically ill-appearing African-American female patient in no acute distress  Eyes: sclera anicteric, no redness  ENT: oral mucosa moist without lesions, no cervical or supraclavicular lymphadenopathy  CV: RRR without murmur, S1/S2, no JVD,, no peripheral edema. Feet cool  Resp: clear to auscultation bilaterally, normal RR and effort noted  GI: soft, no tenderness, with active bowel sounds. No guarding or palpable organomegaly noted  Skin; warm and dry, no rash or jaundice noted  Neuro: awake, alert and oriented x 3. Normal gross motor function and fluent speech. Rectal exam (with her nurse Aldona Bar present): Normal sphincter tone, rust colored formed stool, no palpable lesions but limited exam with retained stool.  Labs:  Recent Labs  Lab 10/15/17 2230 10/16/17 0527 10/16/17 0956  WBC 5.7  --   --   HGB 11.1* 10.5* 10.8*  HCT 34.1* 32.0* 32.9*  PLT 146*  --   --    Recent Labs  Lab 10/16/17 0528  NA 142  K 4.2   CL 112*  CO2 19*  BUN 8  ALBUMIN 3.4*  ALKPHOS 105  ALT 26  AST 59*  GLUCOSE 97   Recent Labs  Lab 10/15/17 2230  INR 1.03   Radiologic studies: Last CT urogram shows nodular hepatic contour suggestive of cirrhosis  @ASSESSMENTPLANBEGIN @ Impression and plan:  Hematochezia Anemia of acute GI blood loss, now stable on repeat check Alcohol abuse Alcohol-related cirrhosis  I suspect she had a diverticular bleed that has now stopped. This does not appear to be an upper GI bleed. I think the best plan at this  point would be watchful waiting on a since diverticular bleeding usually stops on its own. I will keep her on a clear liquid diet in case there is recurrence of bleeding and the need for colonoscopy. I will have my partner Dr. Hilarie Fredrickson check on her tomorrow. Serial hemoglobin and hematocrit and transfuse as needed per transfusion parameters of the internal medicine service.  I counseled her on alcohol cessation   Thank you for the courtesy of this consult.  Please contact me with any questions or concerns.  Nelida Meuse III Pager: [phone] Mon-Fri 8a-5p [phone] after 5p, weekends, holidays

## 2017-10-16 NOTE — Progress Notes (Signed)
Patient seen on arrival to [facility], she appears clinically stable, Hb stable. No bleeding since admission. Called GI, discussed with Dr. Loletha Carrow, appreciate input  [name] M. Cruzita Lederer, MD [facility] [phone]  If 7PM-7AM, please contact night-coverage [email or web address] Password [email or web address]

## 2017-10-16 NOTE — Progress Notes (Addendum)
PROGRESS NOTE    [name]  BJS:[id number] DOB: October 09, 1956 DOA: 10/15/2017 PCP: [name], MD    Brief Narrative:  [name] is a 61 y.o. female with medical history significant for alcohol dependence, liver cirrhosis, seizure disorder, hypertension, and history of upper GI bleeds with erosive esophagitis but no varices on EGD from 3 years ago, now presenting to the ED for evaluation of rectal bleeding.  She reports noting some red blood per rectum yesterday, and then 4 episodes today.  There is some abdominal discomfort associated with this, but no nausea or vomiting.  Abdominal discomfort is described as cramping, localized to the right lower quadrant, and without alleviating or exacerbating factors.  Denies NSAIDs but continues to drink alcohol daily.  ED Course: Upon arrival to the ED, patient is found to be afebrile, saturating well on room air, slightly tachycardic, and with initial blood pressure 94/52.  Chemistry panel is notable for a potassium of 3.3 and AST 75.  CBC features a normocytic anemia with hemoglobin of 11.1, down from 12.18 last month.  Fecal occult blood testing is positive.  Type and screen was performed.  Blood pressure improved spontaneously and the patient has not been in any apparent respiratory distress.  She will be admitted to the telemetry unit for ongoing evaluation and management of suspected lower GI bleed.     Assessment & Plan:   Principal Problem:   Rectal bleeding Active Problems:   Alcohol abuse   Seizure disorder (HCC)   Abnormal LFTs   Anemia   Erosive esophagitis   Cirrhosis of liver without ascites (HCC)   Rectal bleeding; normocytic anemia   - Presents with several episodes of red rectal bleeding  - Slightly tachycardic with SBP in 90's initially, improved prior to IVF  - Hgb is 11.1 on admission, down from 12.1 last month  - No vomiting; has cirrhosis, but no varices on EGD from January 2016 - IV PPI q12h, type &  screen, follow serial H&H   - INR of 1.03 -H&H slightly decreased - Pending transfer to [facility], may need GI evaluation if continues to have bright red blood per rectum  Cirrhosis  - Appears well-compensated  - No varices on EGD from January 2016  - Counseled regarding abstinence from alcohol  -Outpatient follow-up with GI unless patient needs GI intervention while in the hospital  GERD  - Erosive esophagitis on EGD 3 yrs ago  - Managed with BID Protonix at home, continued with IV Protonix as above    Seizure disorder  - Continue suppressive phenobarbital and phenytoin    Alcohol dependence  - No signs of withdrawal on admission  - Monitor with CIWA and prn Ativan - Supplement vitamins and minerals    Hypertension  - BP at goal  - Lisinopril and HCTZ held initially in light of GIB       DVT prophylaxis: SCDs Code Status: Full code Family Communication: Significant other is bedside Disposition Plan: Pending transfer to [facility]   Consultants:   GI to be consulted at [facility]  Procedures:   None  Antimicrobials:   None   Subjective: Patient seen and evaluated in the emergency department.  She is awake and voices that she is still having pretty significant abdominal pain.  Cannot recall any other episodes of bright red blood per rectum in the emergency department.  Significant other is bedside.  Objective: Vitals:   10/16/17 1130 10/16/17 1140 10/16/17 1200 10/16/17 1230  BP: (!) 154/73 (!) 142/77 (!) 148/78 (!) 145/73  Pulse: 88 82 80 78  Resp: 17 17 19 17   Temp:      TempSrc:      SpO2: 100% 99% 100% 100%  Weight:        Intake/Output Summary (Last 24 hours) at 10/16/2017 1316 Last data filed at 10/16/2017 1147 Gross per 24 hour  Intake 1085.83 ml  Output -  Net 1085.83 ml   Filed Weights   10/15/17 2128  Weight: 64 kg (141 lb)    Examination:  General exam: Appears calm and comfortable  Respiratory system: Clear to  auscultation. Respiratory effort normal. Cardiovascular system: S1 & S2 heard, RRR. No JVD, murmurs, rubs, gallops or clicks. No pedal edema. Gastrointestinal system: Abdomen is nondistended, soft and tender in the left lower quadrant. No organomegaly or masses felt. Normal bowel sounds heard. Central nervous system: Alert and oriented. No focal neurological deficits. Extremities: Symmetric 5 x 5 power.  Contracted right upper extremity Skin: No rashes, lesions or ulcers Psychiatry: Judgement and insight appear normal. Mood & affect appropriate.     Data Reviewed: I have personally reviewed following labs and imaging studies  CBC: Recent Labs  Lab 10/15/17 2230 10/16/17 0527 10/16/17 0956  WBC 5.7  --   --   HGB 11.1* 10.5* 10.8*  HCT 34.1* 32.0* 32.9*  MCV 96.1  --   --   PLT 146*  --   --    Basic Metabolic Panel: Recent Labs  Lab 10/15/17 2230 10/16/17 0528  NA 138 142  K 3.3* 4.2  CL 102 112*  CO2 23 19*  GLUCOSE 83 97  BUN 10 8  CREATININE 0.66 0.48  CALCIUM 9.2 8.7*   GFR: Estimated Creatinine Clearance: 64.6 mL/min (by C-G formula based on SCr of 0.48 mg/dL). Liver Function Tests: Recent Labs  Lab 10/15/17 2230 10/16/17 0528  AST 75* 59*  ALT 33 26  ALKPHOS 117 105  BILITOT 0.6 0.6  PROT 8.8* 7.6  ALBUMIN 4.0 3.4*   No results for input(s): LIPASE, AMYLASE in the last 168 hours. No results for input(s): AMMONIA in the last 168 hours. Coagulation Profile: Recent Labs  Lab 10/15/17 2230  INR 1.03   Cardiac Enzymes: No results for input(s): CKTOTAL, CKMB, CKMBINDEX, TROPONINI in the last 168 hours. BNP (last 3 results) No results for input(s): PROBNP in the last 8760 hours. HbA1C: No results for input(s): HGBA1C in the last 72 hours. CBG: Recent Labs  Lab 10/16/17 0753  GLUCAP 97   Lipid Profile: No results for input(s): CHOL, HDL, LDLCALC, TRIG, CHOLHDL, LDLDIRECT in the last 72 hours. Thyroid Function Tests: No results for input(s):  TSH, T4TOTAL, FREET4, T3FREE, THYROIDAB in the last 72 hours. Anemia Panel: No results for input(s): VITAMINB12, FOLATE, FERRITIN, TIBC, IRON, RETICCTPCT in the last 72 hours. Sepsis Labs: No results for input(s): PROCALCITON, LATICACIDVEN in the last 168 hours.  No results found for this or any previous visit (from the past 240 hour(s)).       Radiology Studies: No results found.      Scheduled Meds: . darifenacin  7.5 mg Oral Daily  . folic acid  1 mg Oral Daily  . LORazepam  0-4 mg Oral Q6H   Followed by  . [START ON 10/18/2017] LORazepam  0-4 mg Oral Q12H  . pantoprazole (PROTONIX) IV  40 mg Intravenous Q12H  . PHENObarbital  32.4 mg Oral BID  . phenytoin  100 mg Oral TID  .  sodium chloride flush  3 mL Intravenous Q12H  . thiamine  100 mg Oral Daily   Continuous Infusions: . sodium chloride 75 mL/hr at 10/16/17 1242     LOS: 1 day    Time spent: 30 minutes    Loretha Stapler, MD [facility] Pager [phone]  If 7PM-7AM, please contact night-coverage [email or web address] Password [email or web address] 10/16/2017, 1:16 PM

## 2017-10-16 NOTE — Progress Notes (Signed)
Attempted to get report, no answer, please call Sam RN @ [phone]

## 2017-10-17 DIAGNOSIS — R945 Abnormal results of liver function studies: Secondary | ICD-10-CM | POA: Diagnosis not present

## 2017-10-17 DIAGNOSIS — D62 Acute posthemorrhagic anemia: Secondary | ICD-10-CM | POA: Diagnosis not present

## 2017-10-17 DIAGNOSIS — K922 Gastrointestinal hemorrhage, unspecified: Secondary | ICD-10-CM | POA: Diagnosis not present

## 2017-10-17 DIAGNOSIS — K625 Hemorrhage of anus and rectum: Secondary | ICD-10-CM | POA: Diagnosis not present

## 2017-10-17 DIAGNOSIS — K7030 Alcoholic cirrhosis of liver without ascites: Secondary | ICD-10-CM | POA: Diagnosis not present

## 2017-10-17 DIAGNOSIS — F1010 Alcohol abuse, uncomplicated: Secondary | ICD-10-CM | POA: Diagnosis not present

## 2017-10-17 DIAGNOSIS — K5791 Diverticulosis of intestine, part unspecified, without perforation or abscess with bleeding: Secondary | ICD-10-CM | POA: Diagnosis not present

## 2017-10-17 LAB — TYPE AND SCREEN
ABO/RH(D): O POS
Antibody Screen: NEGATIVE

## 2017-10-17 LAB — ABO/RH: ABO/RH(D): O POS

## 2017-10-17 LAB — HEMOGLOBIN: Hemoglobin: 10 g/dL — ABNORMAL LOW (ref 12.0–15.0)

## 2017-10-17 LAB — GLUCOSE, CAPILLARY: Glucose-Capillary: 86 mg/dL (ref 65–99)

## 2017-10-17 LAB — HEMATOCRIT: HCT: 29.8 % — ABNORMAL LOW (ref 36.0–46.0)

## 2017-10-17 MED ORDER — PANTOPRAZOLE SODIUM 40 MG PO TBEC
40.0000 mg | DELAYED_RELEASE_TABLET | Freq: Two times a day (BID) | ORAL | Status: DC
  Administered 2017-10-17: 40 mg via ORAL
  Filled 2017-10-17: qty 1

## 2017-10-17 NOTE — Progress Notes (Signed)
NURSING PROGRESS NOTE  [name] [id number] Discharge Data: 10/17/2017 2:00 PM Attending Provider: Caren Griffins, MD [name], [name], MD   [name] to be D/C'd Home per MD order.    All IV's will be discontinued and monitored for bleeding.  All belongings will be returned to patient for patient to take home.  Last Documented Vital Signs:  Blood pressure (!) 120/57, pulse 77, temperature 98.2 F (36.8 C), temperature source Oral, resp. rate 16, height 5\' 4"  (1.626 m), weight 67.2 kg (148 lb 2.4 oz), SpO2 99 %.  [name], MSN, RN, [employer]

## 2017-10-17 NOTE — Discharge Summary (Signed)
Physician Discharge Summary  [name] JOA:[id number] DOB: 1955/12/13 DOA: 10/15/2017  PCP: [name], MD  Admit date: 10/15/2017 Discharge date: 10/17/2017  Admitted From: home Disposition:  home  Recommendations for Outpatient Follow-up:  1. Follow up with PCP in 1-2 weeks 2. Follow up with Dr. [name] in 1-2 weeks   Home Health: none Equipment/Devices: none  Discharge Condition: stable CODE STATUS: Full code Diet recommendation: regular  HPI: Per Dr. [name], [name] is a 61 y.o. female with medical history significant for alcohol dependence, liver cirrhosis, seizure disorder, hypertension, and history of upper GI bleeds with erosive esophagitis but no varices on EGD from 3 years ago, now presenting to the ED for evaluation of rectal bleeding.  She reports noting some red blood per rectum yesterday, and then 4 episodes today.  There is some abdominal discomfort associated with this, but no nausea or vomiting.  Abdominal discomfort is described as cramping, localized to the right lower quadrant, and without alleviating or exacerbating factors.  Denies NSAIDs but continues to drink alcohol daily. ED Course: Upon arrival to the ED, patient is found to be afebrile, saturating well on room air, slightly tachycardic, and with initial blood pressure 94/52.  Chemistry panel is notable for a potassium of 3.3 and AST 75.  CBC features a normocytic anemia with hemoglobin of 11.1, down from 12.18 last month.  Fecal occult blood testing is positive.  Type and screen was performed.  Blood pressure improved spontaneously and the patient has not been in any apparent respiratory distress.  She will be admitted to the telemetry unit for ongoing evaluation and management of suspected lower GI bleed.   Hospital Course: Discharge Diagnoses:  Principal Problem:   Rectal bleeding Active Problems:   Alcohol abuse   Seizure disorder (HCC)   Abnormal LFTs   Anemia   Erosive  esophagitis   Cirrhosis of liver without ascites (HCC)   Rectal bleeding; normocytic anemia-Presents with several episodes of red rectal bleeding, which have resolved upon admission to the hospital.  She was transferred from [facility] to [facility] due to lack of GI coverage.  On arrival to [facility], gastroenterology was consulted and have evaluated patient.  Her bleeding clinically has resolved, her hemoglobin has remained stable, this is most likely due to diverticular bleed which has now ceased.  She was unable to tolerate a regular diet, and was discharged home in stable condition and will need follow-up with gastroenterologist in [place]. Cirrhosis -Appears well-compensated, No varices on EGD from January 2016, or shortly she continues to drink GERD -Erosive esophagitis on EGD 3 yrs ago, continue Protonix Seizure disorder-Continue suppressive phenobarbital and phenytoin Alcohol dependence-No signs of withdrawal. Counseled Hypertension-resume home medications  Discharge Instructions   Allergies as of 10/17/2017   No Known Allergies     Medication List    TAKE these medications   albuterol 108 (90 Base) MCG/ACT inhaler Commonly known as:  PROVENTIL HFA;VENTOLIN HFA Inhale 2 puffs into the lungs every 6 (six) hours as needed for wheezing or shortness of breath.   folic acid 1 MG tablet Commonly known as:  FOLVITE Take 1 tablet (1 mg total) by mouth daily.   HYDROcodone-acetaminophen 5-325 MG tablet Commonly known as:  NORCO/VICODIN One tablet by mouth every six hours as needed for pain.  Seven day limit per Medicaid guidelines.   ipratropium-albuterol 0.5-2.5 (3) MG/3ML Soln Commonly known as:  DUONEB Take 3 mLs by nebulization every 6 (six) hours as needed (for shortness of  breath/wheezing).   lisinopril-hydrochlorothiazide 20-12.5 MG tablet Commonly known as:  PRINZIDE,ZESTORETIC Take 1 tablet by mouth daily.   pantoprazole 40 MG tablet Commonly known as:   PROTONIX Take 1 tablet (40 mg total) by mouth 2 (two) times daily before a meal.   PHENObarbital 30 MG tablet Commonly known as:  LUMINAL Take 30 mg by mouth 2 (two) times daily.   phenytoin 100 MG ER capsule Commonly known as:  DILANTIN Take 100 mg by mouth 3 (three) times daily.   polyethylene glycol powder powder Commonly known as:  GLYCOLAX/MIRALAX Take one capful mixed in 4 ounces of liquid daily as needed for constipation   potassium chloride 10 MEQ tablet Commonly known as:  K-DUR Take 4 tablets (40 mEq total) by mouth 2 (two) times daily.   solifenacin 5 MG tablet Commonly known as:  VESICARE Take 5 mg by mouth daily.   thiamine 100 MG tablet Take 1 tablet (100 mg total) by mouth daily.      Follow-up Information    [name], PA-C Follow up on 10/25/2017.   Specialty:  Gastroenterology Why:  10 AM follow up with liver/GI dr's PA.   Contact information: [address] [place] [address] [phone]           Consultations:    Procedures/Studies:   No results found.   Subjective: - no chest pain, shortness of breath, no abdominal pain, nausea or vomiting.   Discharge Exam: Vitals:   10/16/17 1954 10/17/17 0500  BP: (!) 142/66 (!) 120/57  Pulse: 85 77  Resp: 16 16  Temp: 98.9 F (37.2 C) 98.2 F (36.8 C)  SpO2: 100% 99%    General: Pt is alert, awake, not in acute distress Cardiovascular: RRR, S1/S2 +, no rubs, no gallops Respiratory: CTA bilaterally, no wheezing, no rhonchi Abdominal: Soft, NT, ND, bowel sounds + Extremities: no edema, no cyanosis    The results of significant diagnostics from this hospitalization (including imaging, microbiology, ancillary and laboratory) are listed below for reference.     Microbiology: No results found for this or any previous visit (from the past 240 hour(s)).   Labs: BNP (last 3 results) No results for input(s): BNP in the last 8760 hours. Basic  Metabolic Panel: Recent Labs  Lab 10/15/17 2230 10/16/17 0528  NA 138 142  K 3.3* 4.2  CL 102 112*  CO2 23 19*  GLUCOSE 83 97  BUN 10 8  CREATININE 0.66 0.48  CALCIUM 9.2 8.7*   Liver Function Tests: Recent Labs  Lab 10/15/17 2230 10/16/17 0528  AST 75* 59*  ALT 33 26  ALKPHOS 117 105  BILITOT 0.6 0.6  PROT 8.8* 7.6  ALBUMIN 4.0 3.4*   No results for input(s): LIPASE, AMYLASE in the last 168 hours. No results for input(s): AMMONIA in the last 168 hours. CBC: Recent Labs  Lab 10/15/17 2230 10/16/17 0527 10/16/17 0956 10/17/17 0153  WBC 5.7  --   --   --   HGB 11.1* 10.5* 10.8* 10.0*  HCT 34.1* 32.0* 32.9* 29.8*  MCV 96.1  --   --   --   PLT 146*  --   --   --    Cardiac Enzymes: No results for input(s): CKTOTAL, CKMB, CKMBINDEX, TROPONINI in the last 168 hours. BNP: Invalid input(s): POCBNP CBG: Recent Labs  Lab 10/16/17 0753 10/17/17 0739  GLUCAP 97 86   D-Dimer No results for input(s): DDIMER in the last 72  hours. Hgb A1c No results for input(s): HGBA1C in the last 72 hours. Lipid Profile No results for input(s): CHOL, HDL, LDLCALC, TRIG, CHOLHDL, LDLDIRECT in the last 72 hours. Thyroid function studies No results for input(s): TSH, T4TOTAL, T3FREE, THYROIDAB in the last 72 hours.  Invalid input(s): FREET3 Anemia work up No results for input(s): VITAMINB12, FOLATE, FERRITIN, TIBC, IRON, RETICCTPCT in the last 72 hours. Urinalysis    Component Value Date/Time   COLORURINE STRAW (A) 03/02/2017 0303   APPEARANCEUR CLEAR 03/02/2017 0303   LABSPEC 1.004 (L) 03/02/2017 0303   PHURINE 5.0 03/02/2017 0303   GLUCOSEU NEGATIVE 03/02/2017 0303   HGBUR SMALL (A) 03/02/2017 0303   BILIRUBINUR NEGATIVE 03/02/2017 0303   KETONESUR NEGATIVE 03/02/2017 0303   PROTEINUR NEGATIVE 03/02/2017 0303   UROBILINOGEN 0.2 12/26/2014 1927   NITRITE NEGATIVE 03/02/2017 0303   LEUKOCYTESUR NEGATIVE 03/02/2017 0303   Sepsis Labs Invalid input(s): PROCALCITONIN,   WBC,  LACTICIDVEN   Time coordinating discharge: 20 minutes  SIGNED:  Marzetta Board, MD  [facility] 10/17/2017, 2:10 PM Pager [phone]  If 7PM-7AM, please contact night-coverage [email or web address] Password [email or web address]

## 2017-10-17 NOTE — Progress Notes (Signed)
          Daily Rounding Note  10/17/2017, 9:46 AM  LOS: 2 days   SUBJECTIVE:   Denies any more hematochezia or stools.  Feels ok.        OBJECTIVE:         Vital signs in last 24 hours:    Temp:  [98.2 F (36.8 C)-98.9 F (37.2 C)] 98.2 F (36.8 C) (12/31 0500) Pulse Rate:  [77-88] 77 (12/31 0500) Resp:  [15-20] 16 (12/31 0500) BP: (120-154)/(57-88) 120/57 (12/31 0500) SpO2:  [99 %-100 %] 99 % (12/31 0500) Weight:  [66.5 kg (146 lb 11.2 oz)-67.2 kg (148 lb 2.4 oz)] 67.2 kg (148 lb 2.4 oz) (12/31 0500) Last BM Date: 10/15/17 Filed Weights   10/16/17 1230 10/16/17 1359 10/17/17 0500  Weight: 66.5 kg (146 lb 11.2 oz) 66.5 kg (146 lb 11.2 oz) 67.2 kg (148 lb 2.4 oz)   General: pleasant, comfortable, thin but overall looks well   Heart: RRR Chest: clear bil.  No cough or SOB Abdomen: soft, NT, ND.  Active BS  Extremities: contracture deformity in right UE.   Neuro/Psych:  Pleasant, fully oriented.    Intake/Output from previous day: 12/30 0701 - 12/31 0700 In: 1082.8 [I.V.:1082.8] Out: 1125 [Urine:1125]  Intake/Output this shift: No intake/output data recorded.  Lab Results: Recent Labs    10/15/17 2230 10/16/17 0527 10/16/17 0956 10/17/17 0153  WBC 5.7  --   --   --   HGB 11.1* 10.5* 10.8* 10.0*  HCT 34.1* 32.0* 32.9* 29.8*  PLT 146*  --   --   --    BMET Recent Labs    10/15/17 2230 10/16/17 0528  NA 138 142  K 3.3* 4.2  CL 102 112*  CO2 23 19*  GLUCOSE 83 97  BUN 10 8  CREATININE 0.66 0.48  CALCIUM 9.2 8.7*   LFT Recent Labs    10/15/17 2230 10/16/17 0528  PROT 8.8* 7.6  ALBUMIN 4.0 3.4*  AST 75* 59*  ALT 33 26  ALKPHOS 117 105  BILITOT 0.6 0.6   PT/INR Recent Labs    10/15/17 2230  LABPROT 13.4  INR 1.03   Hepatitis Panel No results for input(s): HEPBSAG, HCVAB, HEPAIGM, HEPBIGM in the last 72 hours.  Studies/Results: No results found.  ASSESMENT:   *  Painless  hematochezia in pt with colonoscopy (09/2012, Dr Barney Drain) established dx of diverticulosis.  Presume diverticular dz.   BUN not elevated.   *  Blood loss anemia.  No need of transfusion.    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  Follows regularly with Dr [name] office.    PLAN   *  Stop IV Protonix.  Resume her BID oral dosing schedule.   *  Ok to discharge home.  GI fup previously set for 1/8  with Dr [name].  Advancing to regular diet.      [name]  10/17/2017, 9:46 AM Pager: [phone]

## 2017-10-19 ENCOUNTER — Encounter: Payer: Self-pay | Admitting: Gastroenterology

## 2017-10-25 ENCOUNTER — Ambulatory Visit: Payer: Medicare Other | Admitting: Gastroenterology

## 2017-11-01 ENCOUNTER — Ambulatory Visit (INDEPENDENT_AMBULATORY_CARE_PROVIDER_SITE_OTHER): Payer: Medicare Other | Admitting: Orthopaedic Surgery

## 2017-11-01 ENCOUNTER — Ambulatory Visit (INDEPENDENT_AMBULATORY_CARE_PROVIDER_SITE_OTHER): Payer: Medicare Other

## 2017-11-01 ENCOUNTER — Encounter: Payer: Self-pay | Admitting: Orthopaedic Surgery

## 2017-11-01 VITALS — BP 177/105 | HR 101 | Ht 64.0 in | Wt 147.0 lb

## 2017-11-01 DIAGNOSIS — M25512 Pain in left shoulder: Secondary | ICD-10-CM | POA: Diagnosis not present

## 2017-11-01 DIAGNOSIS — M25511 Pain in right shoulder: Secondary | ICD-10-CM | POA: Diagnosis not present

## 2017-11-01 DIAGNOSIS — R531 Weakness: Secondary | ICD-10-CM | POA: Diagnosis not present

## 2017-11-01 DIAGNOSIS — G8929 Other chronic pain: Secondary | ICD-10-CM | POA: Diagnosis not present

## 2017-11-01 MED ORDER — HYDROCODONE-ACETAMINOPHEN 5-325 MG PO TABS: ORAL_TABLET | ORAL | 0 refills | Status: DC

## 2017-11-01 NOTE — Progress Notes (Signed)
Patient [name], female DOB:1956-02-04, 62 y.o. JYN:[id number]  Chief Complaint  Patient presents with  . Shoulder Pain    bilateral     HPI  [name] is a 62 y.o. female who has right shoulder pain chronically and right sided weakness post CVA.  Her right shoulder is more tender in the cold weather.  She has no new trauma.  She has developed pain in the left shoulder, her "good" shoulder.  She has pain with over head use, popping but no redness, no numbness.  She has no trauma.  It is not getting better with rest, heat, ice, Advil.  HPI  Body mass index is 25.23 kg/m.  ROS  Review of Systems  Constitutional:       Patient does not have Diabetes Mellitus. Patient has hypertension. Patient has COPD or shortness of breath. Patient does not have BMI > 35. Patient does not have current smoking history.  Respiratory: Negative for shortness of breath.   Cardiovascular: Negative for chest pain.  Endocrine: Positive for cold intolerance.  Musculoskeletal: Positive for arthralgias, gait problem, myalgias and neck pain.  Allergic/Immunologic: Positive for environmental allergies.  Neurological: Positive for weakness (right side) and headaches.    Past Medical History:  Diagnosis Date  . Alcohol abuse   . Chronic abdominal pain   . Chronic diarrhea   . Contracture of muscle of hand    right  . HTN (hypertension)   . PUD (peptic ulcer disease)    remote  . Seizures ([place])    since stroke, no recent seizures  . Shortness of breath    with exertion  . 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 ABDOMINAL HYSTERECTOMY  2004   complete  . BRAIN SURGERY     age 81, stroke  . COLONOSCOPY    . COLONOSCOPY WITH PROPOFOL  10/03/2012   Dr. [name]: moderate diverticulosis, small internal hemorrhoids, TUBULAR ADENOMA. Next colonoscopy 2023 per Dr. [name].  . ESOPHAGOGASTRODUODENOSCOPY N/A 10/18/2014   Dr. Gala Romney during inpatient hospitalization: severe exudative/erosive reflux esophagitis as source of trivial upper GI bleed. No varices   . ESOPHAGOGASTRODUODENOSCOPY (EGD) WITH PROPOFOL  10/03/2012   Dr. [name]: stricture at [place] junction s/p dilation, mild gastritis negative H.pylori   . right tib-fib fracture  2008  . SAVORY DILATION  10/03/2012   Procedure: SAVORY DILATION;  Surgeon: Danie Binder, MD;  Location: [facility];  Service: Endoscopy;  Laterality: N/A;  started at 1303,  dilated 12.8-16mm    Family History  Problem Relation Age of Onset  . Liver disease Sister        etoh  . Colon cancer Neg Hx     Social History Social History   Tobacco Use  . Smoking status: Former Smoker    Packs/day: 0.04    Years: 9.00    Pack years: 0.36    Types: Cigarettes    Last attempt to quit: 07/28/2014    Years since quitting: 3.2  . Smokeless tobacco: Never Used  Substance Use Topics  . Alcohol use: Yes    Comment: "sometimes"  . Drug use: No    No Known Allergies  Current Outpatient Medications  Medication Sig Dispense Refill  . albuterol (PROVENTIL HFA;VENTOLIN HFA) 108 (90 BASE) MCG/ACT inhaler Inhale 2 puffs into the lungs every 6 (six) hours as needed for wheezing or shortness of breath.     Marland Kitchen  folic acid (FOLVITE) 1 MG tablet Take 1 tablet (1 mg total) by mouth daily. 30 tablet 3  . HYDROcodone-acetaminophen (NORCO/VICODIN) 5-325 MG tablet One tablet by mouth every six hours as needed for pain.  Seven day limit per Medicaid guidelines. 28 tablet 0  . ipratropium-albuterol (DUONEB) 0.5-2.5 (3) MG/3ML SOLN Take 3 mLs by nebulization every 6 (six) hours as needed (for shortness of breath/wheezing).     Marland Kitchen lisinopril (PRINIVIL,ZESTRIL) 40 MG tablet     . lisinopril-hydrochlorothiazide (PRINZIDE,ZESTORETIC) 20-12.5 MG per tablet Take 1 tablet by mouth daily.    Marland Kitchen omeprazole (PRILOSEC) 20 MG capsule     . pantoprazole (PROTONIX) 40 MG tablet Take 1 tablet (40 mg total) by mouth 2  (two) times daily before a meal. 60 tablet 3  . PHENObarbital (LUMINAL) 30 MG tablet Take 30 mg by mouth 2 (two) times daily.    . phenytoin (DILANTIN) 100 MG ER capsule Take 100 mg by mouth 3 (three) times daily.    . polyethylene glycol powder (GLYCOLAX/MIRALAX) powder Take one capful mixed in 4 ounces of liquid daily as needed for constipation 527 g 0  . potassium chloride SA (K-DUR,KLOR-CON) 20 MEQ tablet     . solifenacin (VESICARE) 5 MG tablet Take 5 mg by mouth daily.    Marland Kitchen thiamine 100 MG tablet Take 1 tablet (100 mg total) by mouth daily. 30 tablet 3  . potassium chloride (K-DUR) 10 MEQ tablet Take 4 tablets (40 mEq total) by mouth 2 (two) times daily. 40 tablet 0   No current facility-administered medications for this visit.      Physical Exam  Blood pressure (!) 177/105, pulse (!) 101, height 5\' 4"  (1.626 m), weight 147 lb (66.7 kg).  Constitutional: overall normal hygiene, normal nutrition, well developed, normal grooming, normal body habitus. Assistive device:none, orthotics  Musculoskeletal: gait and station Limp none, muscle tone and strength are normal, no tremors or atrophy is present.  .  Neurological: coordination has weakness on the right side with deformity of the right hand secondary to old CVA.  Deep tendon reflex/nerve stretch intact.  Sensation normal.  Cranial nerves II-XII intact.   Skin:   Normal overall no scars, lesions, ulcers or rashes. No psoriasis.  Psychiatric: Alert and oriented x 3.  Recent memory intact, remote memory unclear.  Normal mood and affect. Well groomed.  Good eye contact.  Cardiovascular: overall no swelling, no varicosities, no edema bilaterally, normal temperatures of the legs and arms, no clubbing, cyanosis and good capillary refill.  Lymphatic: palpation is normal.  Examination of left Upper Extremity is done.  Inspection:   Overall:  Elbow non-tender without crepitus or defects, forearm non-tender without crepitus or defects,  wrist non-tender without crepitus or defects, hand non-tender.    Shoulder: with glenohumeral joint tenderness, without effusion.   Upper arm: without swelling and tenderness   Range of motion:   Overall:  Full range of motion of the elbow, full range of motion of wrist and full range of motion in fingers.   Shoulder:  left  150 degrees forward flexion; 125 degrees abduction; 30 degrees internal rotation, 30 degrees external rotation, 10 degrees extension, 40 degrees adduction.   Stability:   Overall:  Shoulder, elbow and wrist stable   Strength and Tone:   Overall full shoulder muscles strength, full upper arm strength and normal upper arm bulk and tone.  The right shoulder has limited motion post CVA and marked tightness of the shoulder and weakness of the  arm. She has right hand deformity.  All other systems reviewed and are negative   The patient has been educated about the nature of the problem(s) and counseled on treatment options.  The patient appeared to understand what I have discussed and is in agreement with it.  Encounter Diagnoses  Name Primary?  . Pain in joint of left shoulder Yes  . Chronic right shoulder pain   . Right sided weakness    PROCEDURE NOTE:  The patient request injection, verbal consent was obtained.  The left shoulder was prepped appropriately after time out was performed.   Sterile technique was observed and injection of 1 cc of Depo-Medrol 40 mg with several cc's of plain xylocaine. Anesthesia was provided by ethyl chloride and a 20-gauge needle was used to inject the shoulder area. A posterior approach was used.  The injection was tolerated well.  A band aid dressing was applied.  The patient was advised to apply ice later today and tomorrow to the injection sight as needed.  X-rays were done of the left shoulder, reported separately.  PLAN Call if any problems.  Precautions discussed.  Continue current medications.   Return to clinic 2  weeks   I have reviewed the [place] web site prior to prescribing narcotic medicine for this patient.  Electronically Signed Sanjuana Kava, MD 1/15/20199:10 AM

## 2017-11-03 ENCOUNTER — Ambulatory Visit (HOSPITAL_COMMUNITY)
Admission: RE | Admit: 2017-11-03 | Discharge: 2017-11-03 | Disposition: A | Discharge status: Home / Self Care | Payer: Medicare Other | Source: Ambulatory Visit | Attending: Gastroenterology | Admitting: Gastroenterology

## 2017-11-03 DIAGNOSIS — K7030 Alcoholic cirrhosis of liver without ascites: Secondary | ICD-10-CM | POA: Insufficient documentation

## 2017-11-04 NOTE — Progress Notes (Signed)
Pt is aware of results and aware of date and time of appt in Feb.

## 2017-11-04 NOTE — Progress Notes (Signed)
Cirrhosis but no hepatoma.  Repeat ruq u/s in six months.  Keep upcoming appt with slf.

## 2017-11-15 ENCOUNTER — Ambulatory Visit (INDEPENDENT_AMBULATORY_CARE_PROVIDER_SITE_OTHER): Payer: Medicare Other | Admitting: Orthopaedic Surgery

## 2017-11-15 ENCOUNTER — Encounter: Payer: Self-pay | Admitting: Orthopaedic Surgery

## 2017-11-15 VITALS — BP 114/69 | HR 98 | Temp 97.9°F | Ht 64.0 in | Wt 149.0 lb

## 2017-11-15 DIAGNOSIS — R531 Weakness: Secondary | ICD-10-CM

## 2017-11-15 DIAGNOSIS — G8929 Other chronic pain: Secondary | ICD-10-CM | POA: Diagnosis not present

## 2017-11-15 DIAGNOSIS — M25512 Pain in left shoulder: Secondary | ICD-10-CM

## 2017-11-15 DIAGNOSIS — M25511 Pain in right shoulder: Secondary | ICD-10-CM

## 2017-11-15 MED ORDER — HYDROCODONE-ACETAMINOPHEN 5-325 MG PO TABS: ORAL_TABLET | ORAL | 0 refills | Status: DC

## 2017-11-15 MED ORDER — NAPROXEN 500 MG PO TABS
500.0000 mg | ORAL_TABLET | Freq: Two times a day (BID) | ORAL | 5 refills | Status: DC
Start: 2017-11-15 — End: 2019-01-15

## 2017-11-15 NOTE — Progress Notes (Signed)
Patient XL:[id number] [name], female DOB:12/01/1955, 62 y.o. UVO:[id number]  Chief Complaint  Patient presents with  . Shoulder Pain    bilateral     HPI  [name] is a 62 y.o. female who has bilateral shoulder pain.  The left shoulder is better after the injection.  She has much better motion and less pain.  The right shoulder is still tender and bothering her a lot.  She has post CVA and right sided weakness more of the right upper extremity.  She has no new trauma. HPI  Body mass index is 25.58 kg/m.  ROS  Review of Systems  Constitutional:       Patient does not have Diabetes Mellitus. Patient has hypertension. Patient has COPD or shortness of breath. Patient does not have BMI > 35. Patient does not have current smoking history.  Respiratory: Negative for shortness of breath.   Cardiovascular: Negative for chest pain.  Endocrine: Positive for cold intolerance.  Musculoskeletal: Positive for arthralgias, gait problem, myalgias and neck pain.  Allergic/Immunologic: Positive for environmental allergies.  Neurological: Positive for weakness (right side) and headaches.  All other systems reviewed and are negative.   Past Medical History:  Diagnosis Date  . Alcohol abuse   . Chronic abdominal pain   . Chronic diarrhea   . Contracture of muscle of hand    right  . HTN (hypertension)   . PUD (peptic ulcer disease)    remote  . Seizures ([place])    since stroke, no recent seizures  . Shortness of breath    with exertion  . Stroke [facility])    age 49, required brain surgery    Past Surgical History:  Procedure Laterality Date  . ABDOMINAL HYSTERECTOMY  2004   complete  . BRAIN SURGERY     age 65, stroke  . COLONOSCOPY    . COLONOSCOPY WITH PROPOFOL  10/03/2012   Dr. [name]: moderate diverticulosis, small internal hemorrhoids, TUBULAR ADENOMA. Next colonoscopy 2023 per Dr. [name].  . ESOPHAGOGASTRODUODENOSCOPY N/A 10/18/2014   Dr. Gala Romney during inpatient  hospitalization: severe exudative/erosive reflux esophagitis as source of trivial upper GI bleed. No varices   . ESOPHAGOGASTRODUODENOSCOPY (EGD) WITH PROPOFOL  10/03/2012   Dr. [name]: stricture at [place] junction s/p dilation, mild gastritis negative H.pylori   . right tib-fib fracture  2008  . SAVORY DILATION  10/03/2012   Procedure: SAVORY DILATION;  Surgeon: Danie Binder, MD;  Location: [facility];  Service: Endoscopy;  Laterality: N/A;  started at 1303,  dilated 12.8-16mm    Family History  Problem Relation Age of Onset  . Liver disease Sister        etoh  . Colon cancer Neg Hx     Social History Social History   Tobacco Use  . Smoking status: Former Smoker    Packs/day: 0.04    Years: 9.00    Pack years: 0.36    Types: Cigarettes    Last attempt to quit: 07/28/2014    Years since quitting: 3.3  . Smokeless tobacco: Never Used  Substance Use Topics  . Alcohol use: Yes    Comment: "sometimes"  . Drug use: No    No Known Allergies  Current Outpatient Medications  Medication Sig Dispense Refill  . albuterol (PROVENTIL HFA;VENTOLIN HFA) 108 (90 BASE) MCG/ACT inhaler Inhale 2 puffs into the lungs every 6 (six) hours as needed for wheezing or shortness of breath.     . folic acid (FOLVITE) 1 MG tablet Take 1  tablet (1 mg total) by mouth daily. 30 tablet 3  . HYDROcodone-acetaminophen (NORCO/VICODIN) 5-325 MG tablet One tablet by mouth every six hours as needed for pain.  Seven day limit per Medicaid guidelines. 28 tablet 0  . ipratropium-albuterol (DUONEB) 0.5-2.5 (3) MG/3ML SOLN Take 3 mLs by nebulization every 6 (six) hours as needed (for shortness of breath/wheezing).     Marland Kitchen lisinopril-hydrochlorothiazide (PRINZIDE,ZESTORETIC) 20-12.5 MG per tablet Take 1 tablet by mouth daily.    Marland Kitchen omeprazole (PRILOSEC) 20 MG capsule     . PHENObarbital (LUMINAL) 30 MG tablet Take 30 mg by mouth 2 (two) times daily.    . phenytoin (DILANTIN) 100 MG ER capsule Take 100 mg by mouth 3 (three)  times daily.    . polyethylene glycol powder (GLYCOLAX/MIRALAX) powder Take one capful mixed in 4 ounces of liquid daily as needed for constipation 527 g 0  . potassium chloride SA (K-DUR,KLOR-CON) 20 MEQ tablet     . solifenacin (VESICARE) 5 MG tablet Take 5 mg by mouth daily.    Marland Kitchen thiamine 100 MG tablet Take 1 tablet (100 mg total) by mouth daily. 30 tablet 3  . lisinopril (PRINIVIL,ZESTRIL) 40 MG tablet     . naproxen (NAPROSYN) 500 MG tablet Take 1 tablet (500 mg total) by mouth 2 (two) times daily with a meal. 60 tablet 5  . pantoprazole (PROTONIX) 40 MG tablet Take 1 tablet (40 mg total) by mouth 2 (two) times daily before a meal. 60 tablet 3  . potassium chloride (K-DUR) 10 MEQ tablet Take 4 tablets (40 mEq total) by mouth 2 (two) times daily. 40 tablet 0   No current facility-administered medications for this visit.      Physical Exam  Blood pressure 114/69, pulse 98, temperature 97.9 F (36.6 C), height 5\' 4"  (1.626 m), weight 149 lb (67.6 kg).  Constitutional: overall normal hygiene, normal nutrition, well developed, normal grooming, normal body habitus. Assistive device:none  Musculoskeletal: gait and station Limp right, muscle tone and strength are normal on the left, the right side has weakness from prior CVA.  Neurological: coordination overall normal on the left, right side with weakness from prior CV A.  Deep tendon reflex/nerve stretch intact.  Sensation normal.  Cranial nerves II-XII intact.   Skin:   Normal overall no scars, lesions, ulcers or rashes. No psoriasis.  Psychiatric: Alert and oriented x 3.  Recent memory intact, remote memory unclear.  Normal mood and affect. Well groomed.  Good eye contact.  Cardiovascular: overall no swelling, no varicosities, no edema bilaterally, normal temperatures of the legs and arms, no clubbing, cyanosis and good capillary refill.  Lymphatic: palpation is normal.  Right side deformity of hand post CVA, weakness, limited motion  of the right shoulder.  Pain in the right shoulder. Left shoulder with full motion today.  NV intact on the left.  All other systems reviewed and are negative   The patient has been educated about the nature of the problem(s) and counseled on treatment options.  The patient appeared to understand what I have discussed and is in agreement with it.  Encounter Diagnoses  Name Primary?  . Pain in joint of left shoulder Yes  . Chronic right shoulder pain   . Right sided weakness     PLAN Call if any problems.  Precautions discussed.  Continue current medications.   Return to clinic 4 months.   Narcotic given for one week.  State site down, I was not able to access.  Electronically Signed Sanjuana Kava, MD 1/29/201910:28 AM

## 2017-11-23 ENCOUNTER — Encounter: Payer: Self-pay | Admitting: Gastroenterology

## 2017-11-23 ENCOUNTER — Ambulatory Visit (INDEPENDENT_AMBULATORY_CARE_PROVIDER_SITE_OTHER): Payer: Medicare Other | Admitting: Gastroenterology

## 2017-11-23 ENCOUNTER — Telehealth: Payer: Self-pay

## 2017-11-23 ENCOUNTER — Other Ambulatory Visit: Payer: Self-pay

## 2017-11-23 DIAGNOSIS — R1010 Upper abdominal pain, unspecified: Secondary | ICD-10-CM | POA: Diagnosis not present

## 2017-11-23 DIAGNOSIS — K2100 Gastro-esophageal reflux disease with esophagitis, without bleeding: Secondary | ICD-10-CM

## 2017-11-23 DIAGNOSIS — F1010 Alcohol abuse, uncomplicated: Secondary | ICD-10-CM | POA: Diagnosis not present

## 2017-11-23 DIAGNOSIS — K7030 Alcoholic cirrhosis of liver without ascites: Secondary | ICD-10-CM

## 2017-11-23 DIAGNOSIS — R1013 Epigastric pain: Secondary | ICD-10-CM

## 2017-11-23 NOTE — Telephone Encounter (Signed)
Tried to call pt to inform of pre-op appt 12/20/17 at 10:00am, no answer, LMOAM. Letter mailed.

## 2017-11-23 NOTE — Assessment & Plan Note (Signed)
CONTINUES TO DRINK.  COUNSELED TO CUT ETOH IN HALF.

## 2017-11-23 NOTE — Assessment & Plan Note (Signed)
MOST LIKELY DUE TO NSAID/ETOH GASTRITIS AND/OR GERD, LESS LIKELY GE JUNCTION TUMOR, GASTRIC OR PANCREATIC CA OR CHRONIC MESENTERIC ISCHEMIA. RECENT IMAGING OF ABDOMEN MAY 2018 AND JAN 2019-CIRRHOSIS, NAIAP.  REVIEWED LABS AND IMAGING FROM MAY 2018 TO PRESENT. TO CONTROL LEFT SIDE PAIN: 1. AVOID REFLUX TRIGGERS.  HANDOUT GIVEN. 2. CONTINUE OMEPRAZOLE.  INCREASE TO 30 MINUTES PRIOR TO YOUR MEALS TWICE DAILY. COMPLETE UPPER ENDOSCOPY WITHIN IN 2-3 WEEKS. DISCUSSED PROCEDURE, BENEFITS, & RISKS: < 1% chance of medication reaction, bleeding, OR perforation. TAKE ONE VICODIN WITH ONE REGULAR STRENGTH TYLENOL EVERY 6 HOURS WHEN NEEDED FOR PAIN. NO MORE THAN 8 PILLS A DAY. CUT DOWN TO 2-3 BEERS A DAY. NO MORE THAN 2 ALKA SELTZER A DAY. FOLLOW UP IN 6 MOS.

## 2017-11-23 NOTE — Assessment & Plan Note (Signed)
CONTINUES TO DRINK ETOH. IMAGING AND LABS UP TO DATE.  REVIEWED LABS AND IMAGING FROM MAY 2018 TO PRESENT. COMPLETE UPPER ENDOSCOPY WITHIN IN 2-3 WEEKS. TAKE ONE VICODIN WITH ONE REGULAR STRENGTH TYLENOL EVERY 6 HOURS WHEN NEEDED FOR PAIN. NO MORE THAN 8 PILLS A DAY. CUT DOWN TO 2-3 BEERS A DAY. NO MORE THAN 2 ALKA SELTZER A DAY. FOLLOW UP IN 6 MOS.

## 2017-11-23 NOTE — Progress Notes (Signed)
ON RECALL  °

## 2017-11-23 NOTE — Progress Notes (Signed)
CC'D TO PCP °

## 2017-11-23 NOTE — Progress Notes (Signed)
Subjective:    Patient ID: [name], female    DOB: [birth year], 62 y.o.   MRN: [id number]  [name], MD   HPI C/O LUQ PAIN FOR PAST YEAR(JUST HURTS, NO RADIATION, . ETOH: BEER(ICE HOUSE 4-6 A DAY). ALKA SELTZER 2 A DAY FOR [place]. NO BC/GOODY POWDERS, IBUPROFEN/MOTRIN, OR NAPROXEN/ALEVE.NAUSEA: BRINGS UP PHLEGM EVERY AM. VOMITING: MOST EVERY AM(NO BLOOD). HAD NOSE BLEED THIS AM. BMs:1- 2X/DAY (NL USU.). APPETITE: GOOD. CONSTIPATION: IF EATS A LOT OF STARCH. HEARTBURN: FEELS LIKE CHEST ON FIRE.   PT DENIES FEVER, CHILLS, HEMATOCHEZIA, HEMATEMESIS, melena, diarrhea, CHEST PAIN, SHORTNESS OF BREATH,  CHANGE IN BOWEL IN HABITS, problems swallowing, OR problems with sedation.  Past Medical History:  Diagnosis Date  . Alcohol abuse   . Chronic abdominal pain   . Chronic diarrhea   . Contracture of muscle of hand    right  . HTN (hypertension)   . PUD (peptic ulcer disease)    remote  . Seizures ([place])    since stroke, no recent seizures  . Shortness of breath    with exertion  . Stroke [facility])    age 68, required brain surgery    Past Surgical History:  Procedure Laterality Date  . ABDOMINAL HYSTERECTOMY  2004   complete  . BRAIN SURGERY     age 84, stroke  . COLONOSCOPY    . COLONOSCOPY WITH PROPOFOL  10/03/2012   Dr. [name]: moderate diverticulosis, small internal hemorrhoids, TUBULAR ADENOMA. Next colonoscopy 2023 per Dr. [name].  . ESOPHAGOGASTRODUODENOSCOPY N/A 10/18/2014   Dr. Gala Romney during inpatient hospitalization: severe exudative/erosive reflux esophagitis as source of trivial upper GI bleed. No varices   . ESOPHAGOGASTRODUODENOSCOPY (EGD) WITH PROPOFOL  10/03/2012   Dr. [name]: stricture at [place] junction s/p dilation, mild gastritis negative H.pylori   . right tib-fib fracture  2008  . SAVORY DILATION  10/03/2012   Procedure: SAVORY DILATION;  Surgeon: Danie Binder, MD;  Location: [facility];  Service: Endoscopy;  Laterality: N/A;  started at 1303,  dilated  12.8-16mm    No Known Allergies  Current Outpatient Medications  Medication Sig Dispense Refill  . albuterol (PROVENTIL HFA;VENTOLIN HFA) 108 (90 BASE) MCG/ACT inhaler Inhale 2 puffs into the lungs every 6 (six) hours as needed for wheezing or shortness of breath.     .      . HYDROcodone-acetaminophen (NORCO/VICODIN) 5-325 MG tablet One tablet by mouth every six hours as needed for pain.  Seven day limit per Medicaid guidelines.    Marland Kitchen ipratropium-albuterol (DUONEB) 0.5-2.5 (3) MG/3ML SOLN Take 3 mLs by nebulization every 6 (six) hours as needed (for shortness of breath/wheezing).     Marland Kitchen lisinopril (PRINIVIL,ZESTRIL) 40 MG tablet Take 40 mg by mouth daily.     .      . omeprazole (PRILOSEC) 20 MG capsule ONCE A DAY    . PHENObarbital (LUMINAL) 30 MG tablet Take 30 mg by mouth 2 (two) times daily.    . phenytoin (DILANTIN) 100 MG ER capsule Take 100 mg by mouth 3 (three) times daily.    . polyethylene glycol powder (GLYCOLAX/MIRALAX) powder Take one capful mixed in 4 ounces of liquid daily as needed for constipation    . potassium chloride (K-DUR) 10 MEQ tablet  Take 20 mEq by mouth 2 (two) times daily. )    .      . solifenacin (VESICARE) 5 MG tablet Take 5 mg by mouth daily.    Marland Kitchen thiamine 100 MG tablet  Take 1 tablet (100 mg total) by mouth daily.    Marland Kitchen lisinopril-hydrochlorothiazide (PRINZIDE,ZESTORETIC) 20-12.5 MG per tablet Take 1 tablet by mouth daily.    .       Review of Systems PER HPI OTHERWISE ALL SYSTEMS ARE NEGATIVE.    Objective:   Physical Exam  Constitutional: She is oriented to person, place, and time. She appears well-developed and well-nourished. No distress.  HENT:  Head: Normocephalic and atraumatic.  Mouth/Throat: Oropharynx is clear and moist. No oropharyngeal exudate.  Eyes: Pupils are equal, round, and reactive to light. No scleral icterus.  Neck: Normal range of motion. Neck supple.  Cardiovascular: Normal rate, regular rhythm and normal heart sounds.    Pulmonary/Chest: Effort normal and breath sounds normal. No respiratory distress.  Abdominal: Soft. Bowel sounds are normal. She exhibits no distension. There is tenderness. There is no rebound.  EXAM LIMITED-PT IN CHAIR, MILD LUQ TENDERNESS  Musculoskeletal: She exhibits no edema.  Lymphadenopathy:    She has no cervical adenopathy.  Neurological: She is alert and oriented to person, place, and time.  NO  NEW FOCAL DEFICITS, RUE hemiparesis, UNABLE TO GET ON EXAM TABLE  Psychiatric: She has a normal mood and affect.  Vitals reviewed.     Assessment & Plan:

## 2017-11-23 NOTE — Patient Instructions (Addendum)
TO CONTROL LEFT SIDE PAIN: 1. AVOID REFLUX TRIGGERS. SEE INFO BELOW. 2. CONTINUE OMEPRAZOLE.  INCREASE TO 30 MINUTES PRIOR TO YOUR MEALS TWICE DAILY.  COMPLETE UPPER ENDOSCOPY WITHIN IN 2-3 WEEKS.  TAKE ONE VICODIN WITH ONE REGULAR STRENGTH TYLENOL EVERY 6 HOURS WHEN NEEDED FOR PAIN. NO MORE THAN 8 PILLS A DAY.  CUT DOWN TO 2-3 BEERS A DAY.  NO MORE THAN 2 ALKA SELTZER A DAY.  FOLLOW UP IN 6 MOS.     Lifestyle and home remedies TO CONTROL HEART BURN You may eliminate or reduce the frequency of heartburn by making the following lifestyle changes:  . Control your weight. Being overweight is a major risk factor for heartburn and GERD. Excess pounds put pressure on your abdomen, pushing up your stomach and causing acid to back up into your esophagus.   . Eat smaller meals. 4 TO 6 MEALS A DAY. This reduces pressure on the lower esophageal sphincter, helping to prevent the valve from opening and acid from washing back into your esophagus.   Dolphus Jenny your belt. Clothes that fit tightly around your waist put pressure on your abdomen and the lower esophageal sphincter.    .  Eliminate heartburn triggers. Everyone has specific triggers. Common triggers such as fatty or fried foods, spicy food, tomato sauce, carbonated beverages, alcohol, chocolate, mint, garlic, onion, caffeine and nicotine may make heartburn worse.    Marland Kitchen Avoid stooping or bending. Tying your shoes is OK. Bending over for longer periods to weed your garden isn't, especially soon after eating.   . Don't lie down after a meal. Wait at least three to four hours after eating before going to bed, and don't lie down right after eating.   Alternative medicine . Several home remedies exist for treating GERD, but they provide only temporary relief. They include drinking baking soda (sodium bicarbonate) added to water or drinking other fluids such as baking soda mixed with cream of tartar and water. . Although these liquids create  temporary relief by neutralizing, washing away or buffering acids, eventually they aggravate the situation by adding gas and fluid to your stomach, increasing pressure and causing more acid reflux. Further, adding more sodium to your diet may increase your blood pressure and add stress to your heart, and excessive bicarbonate ingestion can alter the acid-base balance in your body.

## 2017-11-23 NOTE — Assessment & Plan Note (Signed)
EGD 2016: EROSION IN ESOPHAGUS.  REVIEWED LABS AND IMAGING FROM MAY 2018 TO PRESENT. TO CONTROL LEFT SIDE PAIN: 1. AVOID REFLUX TRIGGERS.  HANDOUT GIVEN. 2. CONTINUE OMEPRAZOLE.  INCREASE TO 30 MINUTES PRIOR TO YOUR MEALS TWICE DAILY. COMPLETE UPPER ENDOSCOPY WITHIN IN 2-3 WEEKS. TAKE ONE VICODIN WITH ONE REGULAR STRENGTH TYLENOL EVERY 6 HOURS WHEN NEEDED FOR PAIN. NO MORE THAN 8 PILLS A DAY. CUT DOWN TO 2-3 BEERS A DAY. FOLLOW UP IN 6 MOS.

## 2017-12-16 NOTE — Patient Instructions (Signed)
[name]  12/16/2017     @PREFPERIOPPHARMACY @   Your procedure is scheduled on 12/27/2017.  Report to [facility] at 8:15 A.M.  Call this number if you have problems the morning of surgery:  [phone]   Remember:  Do not eat food or drink liquids after midnight.  Take these medicines the morning of surgery with A SIP OF WATER Albuterol inhaler and bring with you, Hydrocodone if needed, Lisinopril, Prilosec,  Phenobarbital, Vesicare   Do not wear jewelry, make-up or nail polish.  Do not wear lotions, powders, or perfumes, or deodorant.  Do not shave 48 hours prior to surgery.  Men may shave face and neck.  Do not bring valuables to the hospital.  [facility] is not responsible for any belongings or valuables.  Contacts, dentures or bridgework may not be worn into surgery.  Leave your suitcase in the car.  After surgery it may be brought to your room.  For patients admitted to the hospital, discharge time will be determined by your treatment team.  Patients discharged the day of surgery will not be allowed to drive home.    Please read over the following fact sheets that you were given. Anesthesia Post-op Instructions     PATIENT INSTRUCTIONS POST-ANESTHESIA  IMMEDIATELY FOLLOWING SURGERY:  Do not drive or operate machinery for the first twenty four hours after surgery.  Do not make any important decisions for twenty four hours after surgery or while taking narcotic pain medications or sedatives.  If you develop intractable nausea and vomiting or a severe headache please notify your doctor immediately.  FOLLOW-UP:  Please make an appointment with your surgeon as instructed. You do not need to follow up with anesthesia unless specifically instructed to do so.  WOUND CARE INSTRUCTIONS (if applicable):  Keep a dry clean dressing on the anesthesia/puncture wound site if there is drainage.  Once the wound has quit draining you may leave it open to air.  Generally you  should leave the bandage intact for twenty four hours unless there is drainage.  If the epidural site drains for more than 36-48 hours please call the anesthesia department.  QUESTIONS?:  Please feel free to call your physician or the hospital operator if you have any questions, and they will be happy to assist you.      Esophageal Varices The esophagus is the passage that connects the throat to the stomach. Esophageal varices are blood vessels in the esophagus that have become enlarged. They develop when extra blood is forced to flow through them because the blood's normal pathway is blocked. Without treatment these blood vessels eventually break and bleed (hemorrhage). A hemorrhage is life-threatening. What are the causes? This condition may be caused by:  Scarring of the liver (cirrhosis) due to alcoholism. This is the most common cause.  Liver disease.  Severe heart failure.  A blood clot in the portal vein.  Sarcoidosis. This is an inflammatory disease that can affect the liver.  Schistosomiasis. This is a parasitic infection that can cause liver damage.  What are the signs or symptoms? Usually there are no symptoms unless the esophageal varices bleed. Symptoms of bleeding esophageal varices include:  Vomiting material that is bright red or that is black and looks like coffee grounds.  Coughing up blood.  Black, tarry stools.  Dizziness or lightheadedness.  Low blood pressure.  Loss of consciousness.  How is this diagnosed? This condition is diagnosed with tests, such as:  Endoscopy. During  this test a thin, lighted tube is inserted through the mouth and into the esophagus.  Blood tests. These may be done to check liver function, blood counts, and the body's ability to form blood clots.  How is this treated? This condition may be treated with:  Medicines that reduce pressure in the esophageal varices and reduce the risk of bleeding.  Procedures to reduce pressure  in the esophageal varices and reduce the risk of bleeding or stop bleeding. These include: ? Variceal ligation. In this procedure, a rubber band is placed around the esophageal varices to keep them from bleeding. ? Injection therapy. This treatment involves an injection that causes the esophageal varices to shrink and close (sclerotherapy). Medicines that tighten blood vessels or alter blood flow may also be used. ? Balloon tamponade. In this procedure, a tube is put into the esophagus and a balloon is passed through it and inflated. ? Transjugular intrahepatic portosystemic shunt (TIPS) placement. In this procedure, a small tube is placed within the liver veins. This decreases blood flow and pressure to the esophageal varices.  A liver transplant. This may be done if other treatments do not work.  Follow these instructions at home:  Take medicines only as directed by your health care provider.  Follow your health care provider's instructions about rest and physical activity. Get help right away if:  You have any symptoms of this condition after treatment.  You are unable to eat or drink.  You have chest pain. This information is not intended to replace advice given to you by your health care provider. Make sure you discuss any questions you have with your health care provider. Document Released: 12/25/2003 Document Revised: 03/11/2016 Document Reviewed: 09/30/2014 Elsevier Interactive Patient Education  2018 [employer]. Esophagogastroduodenoscopy Esophagogastroduodenoscopy (EGD) is a procedure to examine the lining of the esophagus, stomach, and first part of the small intestine (duodenum). This procedure is done to check for problems such as inflammation, bleeding, ulcers, or growths. During this procedure, a long, flexible, lighted tube with a camera attached (endoscope) is inserted down the throat. Tell a health care provider about:  Any allergies you have.  All medicines you are  taking, including vitamins, herbs, eye drops, creams, and over-the-counter medicines.  Any problems you or family members have had with anesthetic medicines.  Any blood disorders you have.  Any surgeries you have had.  Any medical conditions you have.  Whether you are pregnant or may be pregnant. What are the risks? Generally, this is a safe procedure. However, problems may occur, including:  Infection.  Bleeding.  A tear (perforation) in the esophagus, stomach, or duodenum.  Trouble breathing.  Excessive sweating.  Spasms of the larynx.  A slowed heartbeat.  Low blood pressure.  What happens before the procedure?  Follow instructions from your health care provider about eating or drinking restrictions.  Ask your health care provider about: ? Changing or stopping your regular medicines. This is especially important if you are taking diabetes medicines or blood thinners. ? Taking medicines such as aspirin and ibuprofen. These medicines can thin your blood. Do not take these medicines before your procedure if your health care provider instructs you not to.  Plan to have someone take you home after the procedure.  If you wear dentures, be ready to remove them before the procedure. What happens during the procedure?  To reduce your risk of infection, your health care team will wash or sanitize their hands.  An IV tube will be  put in a vein in your hand or arm. You will get medicines and fluids through this tube.  You will be given one or more of the following: ? A medicine to help you relax (sedative). ? A medicine to numb the area (local anesthetic). This medicine may be sprayed into your throat. It will make you feel more comfortable and keep you from gagging or coughing during the procedure. ? A medicine for pain.  A mouth guard may be placed in your mouth to protect your teeth and to keep you from biting on the endoscope.  You will be asked to lie on your left  side.  The endoscope will be lowered down your throat into your esophagus, stomach, and duodenum.  Air will be put into the endoscope. This will help your health care provider see better.  The lining of your esophagus, stomach, and duodenum will be examined.  Your health care provider may: ? Take a tissue sample so it can be looked at in a lab (biopsy). ? Remove growths. ? Remove objects (foreign bodies) that are stuck. ? Treat any bleeding with medicines or other devices that stop tissue from bleeding. ? Widen (dilate) or stretch narrowed areas of your esophagus and stomach.  The endoscope will be taken out. The procedure may vary among health care providers and hospitals. What happens after the procedure?  Your blood pressure, heart rate, breathing rate, and blood oxygen level will be monitored often until the medicines you were given have worn off.  Do not eat or drink anything until the numbing medicine has worn off and your gag reflex has returned. This information is not intended to replace advice given to you by your health care provider. Make sure you discuss any questions you have with your health care provider. Document Released: 02/04/2005 Document Revised: 03/11/2016 Document Reviewed: 08/28/2015 Elsevier Interactive Patient Education  [employer].

## 2017-12-20 ENCOUNTER — Other Ambulatory Visit: Payer: Self-pay

## 2017-12-20 ENCOUNTER — Encounter (HOSPITAL_COMMUNITY)
Admission: RE | Admit: 2017-12-20 | Discharge: 2017-12-20 | Disposition: A | Discharge status: Home / Self Care | Payer: Medicare Other | Source: Ambulatory Visit | Attending: Gastroenterology | Admitting: Gastroenterology

## 2017-12-20 ENCOUNTER — Encounter (HOSPITAL_COMMUNITY): Payer: Self-pay

## 2017-12-20 DIAGNOSIS — Z01812 Encounter for preprocedural laboratory examination: Secondary | ICD-10-CM | POA: Diagnosis not present

## 2017-12-20 DIAGNOSIS — Z01810 Encounter for preprocedural cardiovascular examination: Secondary | ICD-10-CM | POA: Insufficient documentation

## 2017-12-20 HISTORY — DX: Gastro-esophageal reflux disease without esophagitis: K21.9

## 2017-12-20 HISTORY — DX: Unspecified asthma, uncomplicated: J45.909

## 2017-12-20 LAB — CBC WITH DIFFERENTIAL/PLATELET
Basophils Absolute: 0 10*3/uL (ref 0.0–0.1)
Basophils Relative: 1 %
Eosinophils Absolute: 0.1 10*3/uL (ref 0.0–0.7)
Eosinophils Relative: 2 %
HCT: 36.2 % (ref 36.0–46.0)
Hemoglobin: 11.7 g/dL — ABNORMAL LOW (ref 12.0–15.0)
Lymphocytes Relative: 39 %
Lymphs Abs: 1.2 10*3/uL (ref 0.7–4.0)
MCH: 30.1 pg (ref 26.0–34.0)
MCHC: 32.3 g/dL (ref 30.0–36.0)
MCV: 93.1 fL (ref 78.0–100.0)
Monocytes Absolute: 0.3 10*3/uL (ref 0.1–1.0)
Monocytes Relative: 9 %
Neutro Abs: 1.5 10*3/uL — ABNORMAL LOW (ref 1.7–7.7)
Neutrophils Relative %: 49 %
Platelets: 158 10*3/uL (ref 150–400)
RBC: 3.89 MIL/uL (ref 3.87–5.11)
RDW: 13 % (ref 11.5–15.5)
WBC: 3.1 10*3/uL — ABNORMAL LOW (ref 4.0–10.5)

## 2017-12-20 LAB — BASIC METABOLIC PANEL
Anion gap: 14 (ref 5–15)
BUN: 12 mg/dL (ref 6–20)
CO2: 22 mmol/L (ref 22–32)
Calcium: 9.2 mg/dL (ref 8.9–10.3)
Chloride: 100 mmol/L — ABNORMAL LOW (ref 101–111)
Creatinine, Ser: 0.63 mg/dL (ref 0.44–1.00)
GFR calc Af Amer: >60 mL/min (ref >60)
GFR calc non Af Amer: >60 mL/min (ref >60)
Glucose, Bld: 134 mg/dL — ABNORMAL HIGH (ref 65–99)
Potassium: 3.4 mmol/L — ABNORMAL LOW (ref 3.5–5.1)
Sodium: 136 mmol/L (ref 135–145)

## 2017-12-20 MED ORDER — POTASSIUM CHLORIDE ER 20 MEQ PO TBCR: 40.0000 meq | EXTENDED_RELEASE_TABLET | Freq: Two times a day (BID) | ORAL | 0 refills | Status: DC

## 2017-12-22 NOTE — Pre-Procedure Instructions (Signed)
Called patient to instruct her of low potassium and to pick up potassium Rx from drugstore. I had to leave her a message. She did not answer but I instructed her to call us with questions concerning this.

## 2017-12-27 ENCOUNTER — Encounter (HOSPITAL_COMMUNITY): Payer: Self-pay | Admitting: Gastroenterology

## 2017-12-27 ENCOUNTER — Encounter (HOSPITAL_COMMUNITY)
Admission: RE | Disposition: A | Discharge status: Home / Self Care | Payer: Self-pay | Source: Ambulatory Visit | Attending: Gastroenterology

## 2017-12-27 ENCOUNTER — Ambulatory Visit (HOSPITAL_COMMUNITY): Payer: Medicare Other | Admitting: Anesthesiology

## 2017-12-27 ENCOUNTER — Ambulatory Visit (HOSPITAL_COMMUNITY)
Admission: RE | Admit: 2017-12-27 | Discharge: 2017-12-27 | Disposition: A | Discharge status: Home / Self Care | Payer: Medicare Other | Source: Ambulatory Visit | Attending: Gastroenterology | Admitting: Gastroenterology

## 2017-12-27 DIAGNOSIS — I10 Essential (primary) hypertension: Secondary | ICD-10-CM | POA: Insufficient documentation

## 2017-12-27 DIAGNOSIS — Z87891 Personal history of nicotine dependence: Secondary | ICD-10-CM | POA: Insufficient documentation

## 2017-12-27 DIAGNOSIS — R1013 Epigastric pain: Secondary | ICD-10-CM | POA: Diagnosis not present

## 2017-12-27 DIAGNOSIS — K7030 Alcoholic cirrhosis of liver without ascites: Secondary | ICD-10-CM

## 2017-12-27 DIAGNOSIS — Z8673 Personal history of transient ischemic attack (TIA), and cerebral infarction without residual deficits: Secondary | ICD-10-CM | POA: Diagnosis not present

## 2017-12-27 DIAGNOSIS — K648 Other hemorrhoids: Secondary | ICD-10-CM | POA: Insufficient documentation

## 2017-12-27 DIAGNOSIS — K210 Gastro-esophageal reflux disease with esophagitis: Secondary | ICD-10-CM | POA: Diagnosis present

## 2017-12-27 DIAGNOSIS — Z8711 Personal history of peptic ulcer disease: Secondary | ICD-10-CM | POA: Insufficient documentation

## 2017-12-27 DIAGNOSIS — J45909 Unspecified asthma, uncomplicated: Secondary | ICD-10-CM | POA: Diagnosis not present

## 2017-12-27 DIAGNOSIS — Z79899 Other long term (current) drug therapy: Secondary | ICD-10-CM | POA: Insufficient documentation

## 2017-12-27 DIAGNOSIS — K449 Diaphragmatic hernia without obstruction or gangrene: Secondary | ICD-10-CM | POA: Diagnosis not present

## 2017-12-27 HISTORY — PX: ESOPHAGOGASTRODUODENOSCOPY (EGD) WITH PROPOFOL: SHX5813

## 2017-12-27 LAB — HEPATIC FUNCTION PANEL
ALT: 23 U/L (ref 14–54)
AST: 53 U/L — ABNORMAL HIGH (ref 15–41)
Albumin: 3.8 g/dL (ref 3.5–5.0)
Alkaline Phosphatase: 130 U/L — ABNORMAL HIGH (ref 38–126)
Bilirubin, Direct: 0.3 mg/dL (ref 0.1–0.5)
Indirect Bilirubin: 1 mg/dL — ABNORMAL HIGH (ref 0.3–0.9)
Total Bilirubin: 1.3 mg/dL — ABNORMAL HIGH (ref 0.3–1.2)
Total Protein: 7.7 g/dL (ref 6.5–8.1)

## 2017-12-27 SURGERY — ESOPHAGOGASTRODUODENOSCOPY (EGD) WITH PROPOFOL
Anesthesia: Monitor Anesthesia Care

## 2017-12-27 MED ORDER — SODIUM CHLORIDE 0.9% FLUSH
INTRAVENOUS | Status: AC
  Filled 2017-12-27: qty 10

## 2017-12-27 MED ORDER — PROPOFOL 500 MG/50ML IV EMUL
INTRAVENOUS | Status: DC | PRN
  Administered 2017-12-27: 150 ug/kg/min via INTRAVENOUS

## 2017-12-27 MED ORDER — FENTANYL CITRATE (PF) 100 MCG/2ML IJ SOLN
INTRAMUSCULAR | Status: AC
  Filled 2017-12-27: qty 2

## 2017-12-27 MED ORDER — LACTATED RINGERS IV SOLN
INTRAVENOUS | Status: DC
  Administered 2017-12-27: 09:00:00 via INTRAVENOUS

## 2017-12-27 MED ORDER — CHLORHEXIDINE GLUCONATE CLOTH 2 % EX PADS: 6.0000 | MEDICATED_PAD | Freq: Once | CUTANEOUS | Status: DC

## 2017-12-27 MED ORDER — PROPOFOL 10 MG/ML IV BOLUS
INTRAVENOUS | Status: AC
  Filled 2017-12-27: qty 20

## 2017-12-27 MED ORDER — FENTANYL CITRATE (PF) 100 MCG/2ML IJ SOLN
25.0000 ug | Freq: Once | INTRAMUSCULAR | Status: AC
  Administered 2017-12-27: 25 ug via INTRAVENOUS

## 2017-12-27 MED ORDER — MIDAZOLAM HCL 2 MG/2ML IJ SOLN
INTRAMUSCULAR | Status: AC
  Filled 2017-12-27: qty 2

## 2017-12-27 MED ORDER — LIDOCAINE VISCOUS 2 % MT SOLN
OROMUCOSAL | Status: DC | PRN
  Administered 2017-12-27: 6 mL via OROMUCOSAL

## 2017-12-27 MED ORDER — PROPOFOL 10 MG/ML IV BOLUS
INTRAVENOUS | Status: DC | PRN
  Administered 2017-12-27: 20 mg via INTRAVENOUS
  Administered 2017-12-27: 10 mg via INTRAVENOUS

## 2017-12-27 MED ORDER — LIDOCAINE VISCOUS 2 % MT SOLN
OROMUCOSAL | Status: AC
  Filled 2017-12-27: qty 15

## 2017-12-27 MED ORDER — PROPOFOL 10 MG/ML IV BOLUS
INTRAVENOUS | Status: AC
  Filled 2017-12-27: qty 40

## 2017-12-27 MED ORDER — MIDAZOLAM HCL 2 MG/2ML IJ SOLN
1.0000 mg | INTRAMUSCULAR | Status: AC
  Administered 2017-12-27: 2 mg via INTRAVENOUS

## 2017-12-27 NOTE — Op Note (Signed)
[facility] Patient Name: [name] Procedure Date: 12/27/2017 9:08 AM MRN: [id number] Date of Birth: 01-11-1956 Attending MD: Barney Drain MD, MD CSN: [id number] Age: 62 Admit Type: Outpatient Procedure:                Upper GI endoscopy, DIAGNOSTIC Indications:              Dyspepsia. NSAIDs AND ETOH DAILY. Providers:                Barney Drain MD, MD, Jeanann Lewandowsky. Sharon Seller, RN,                            Randa Spike, Technician Referring MD:             [name] MD, MD Medicines:                Propofol per Anesthesia Complications:            No immediate complications. Estimated Blood Loss:     Estimated blood loss: none. Procedure:                Pre-Anesthesia Assessment:                           - Prior to the procedure, a History and Physical                            was performed, and patient medications and                            allergies were reviewed. The patient's tolerance of                            previous anesthesia was also reviewed. The risks                            and benefits of the procedure and the sedation                            options and risks were discussed with the patient.                            All questions were answered, and informed consent                            was obtained. Prior Anticoagulants: The patient has                            taken naproxen, last dose was day of procedure.                            After reviewing the risks and benefits, the patient                            was deemed in satisfactory condition to undergo the  procedure. After obtaining informed consent, the                            endoscope was passed under direct vision.                            Throughout the procedure, the patient's blood                            pressure, pulse, and oxygen saturations were                            monitored continuously. The [id number] ([id number])                             scope was introduced through the mouth, and                            advanced to the second part of duodenum. The upper                            GI endoscopy was accomplished without difficulty.                            The patient tolerated the procedure well. Scope In: 9:23:35 AM Scope Out: 9:27:05 AM Total Procedure Duration: 0 hours 3 minutes 30 seconds  Findings:      LA Grade D (one or more mucosal breaks involving at least 75% of       esophageal circumference) esophagitis with bleeding was found.      A medium-sized hiatal hernia was present.      The examined duodenum was normal. Impression:               - LUQ PAIN DUE TO LA Grade D reflux esophagitis.                           - Medium-sized hiatal hernia. Moderate Sedation:      Per Anesthesia Care Recommendation:           - High fiber diet and low fat diet.                           - Continue present medications. OMEPRAZOLE BID.                            AVOID ETOH.                           - Return to my office in 5 months.                           - Patient has a contact number available for                            emergencies. The signs and symptoms of potential  delayed complications were discussed with the                            patient. Return to normal activities tomorrow.                            Written discharge instructions were provided to the                            patient. Procedure Code(s):        --- Professional ---                           [id number], Esophagogastroduodenoscopy, flexible,                            transoral; diagnostic, including collection of                            specimen(s) by brushing or washing, when performed                            (separate procedure) Diagnosis Code(s):        --- Professional ---                           K21.0, Gastro-esophageal reflux disease with                             esophagitis                           K44.9, Diaphragmatic hernia without obstruction or                            gangrene                           R10.13, Epigastric pain CPT copyright 2016 American [employer]. All rights reserved. The codes documented in this report are preliminary and upon coder review may  be revised to meet current compliance requirements. Barney Drain, MD Barney Drain MD, MD 12/27/2017 9:38:36 AM This report has been signed electronically. Number of Addenda: 0

## 2017-12-27 NOTE — Discharge Instructions (Signed)
YOUR LEFT SIDE PAIN IS DUE TO EROSIVE ESOPHAGITIS DUE TO UNCONTROLLED ACID REFLUX.  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    AVOID REFLUX TRIGGERS. SEE INFO BELOW.  AVOID ALCOHOL.  STRICTLY FOLLOW A LOW FAT DIET. SEE HANDOUT.  MEATS SHOULD BE BAKED, BROILED, OR BOILED. AVOID FRIED FOODS. DO NOT EAT CHUNKS OF ANYTHING. SEE INFO BELOW.   CONTINUE OMEPRAZOLE. TAKE 30 MINUTES PRIOR TO MEALS TWICE DAILY.  YOUR BIOPSY RESULTS WILL BE AVAILABLE IN 7 DAYS.  FOLLOW UP IN AUG 2019.  UPPER ENDOSCOPY AFTER CARE Read the instructions outlined below and refer to this sheet in the next week. These discharge instructions provide you with general information on caring for yourself after you leave the hospital. While your treatment has been planned according to the most current medical practices available, unavoidable complications occasionally occur. If you have any problems or questions after discharge, call DR. FIELDS, [phone].  ACTIVITY  You may resume your regular activity, but move at a slower pace for the next 24 hours.   Take frequent rest periods for the next 24 hours.   Walking will help get rid of the air and reduce the bloated feeling in your belly (abdomen).   No driving for 24 hours (because of the medicine (anesthesia) used during the test).   You may shower.   Do not sign any important legal documents or operate any machinery for 24 hours (because of the anesthesia used during the test).    NUTRITION  Drink plenty of fluids.   You may resume your normal diet as instructed by your doctor.   Begin with a light meal and progress to your normal diet. Heavy or fried foods are harder to digest and may make you feel sick to your stomach (nauseated).   Avoid alcoholic beverages for 24 hours or as instructed.    MEDICATIONS  You may resume your normal medications.   WHAT YOU CAN EXPECT TODAY  Some feelings of bloating in the abdomen.   Passage of more gas than usual.     IF YOU HAD A BIOPSY TAKEN DURING THE UPPER ENDOSCOPY:  Eat a soft diet IF YOU HAVE NAUSEA, BLOATING, ABDOMINAL PAIN, OR VOMITING.    FINDING OUT THE RESULTS OF YOUR TEST Not all test results are available during your visit. DR. [name] WILL CALL YOU WITHIN 14 DAYS OF YOUR PROCEDUE WITH YOUR RESULTS. Do not assume everything is normal if you have not heard from DR. FIELDS, CALL HER OFFICE AT [phone].  SEEK IMMEDIATE MEDICAL ATTENTION AND CALL THE OFFICE: [phone] IF:  You have more than a spotting of blood in your stool.   Your belly is swollen (abdominal distention).   You are nauseated or vomiting.   You have a temperature over 101F.   You have abdominal pain or discomfort that is severe or gets worse throughout the day.   Lifestyle and home remedies TO CONTROL REFLUX  You may eliminate or reduce the frequency of heartburn by making the following lifestyle changes:   Control your weight. Being overweight is a major risk factor for heartburn and GERD. Excess pounds put pressure on your abdomen, pushing up your stomach and causing acid to back up into your esophagus.    Eat smaller meals. 4 TO 6 MEALS A DAY. This reduces pressure on the lower esophageal sphincter, helping to prevent the valve from opening and acid from washing back into your esophagus.    Loosen your belt. Clothes that fit tightly  around your waist put pressure on your abdomen and the lower esophageal sphincter.    Eliminate heartburn triggers. Everyone has specific triggers. Common triggers such as fatty or fried foods, spicy food, tomato sauce, carbonated beverages, alcohol, chocolate, mint, garlic, onion, caffeine and nicotine may make heartburn worse.    Avoid stooping or bending FOR AT LEAST ONE HOUR AFTER EATING. Tying your shoes is OK. Bending over for longer periods to weed your garden isn't, especially soon after eating.    Don't lie down after a meal. Wait at least three to four hours  after eating before going to bed, and don't lie down right after eating.    Alternative medicine  Several home remedies exist for treating GERD, but they provide only temporary relief. They include drinking baking soda (sodium bicarbonate) added to water or drinking other fluids such as baking soda mixed with cream of tartar and water.   Although these liquids create temporary relief by neutralizing, washing away or buffering acids, eventually they aggravate the situation by adding gas and fluid to your stomach, increasing pressure and causing more acid reflux. Further, adding more sodium to your diet may increase your blood pressure and add stress to your heart, and excessive bicarbonate ingestion can alter the acid-base balance in your body.    Low-Fat Diet  BREADS, CEREALS, PASTA, RICE, DRIED PEAS, AND BEANS These products are high in carbohydrates and most are low in fat. Therefore, they can be increased in the diet as substitutes for fatty foods. They too, however, contain calories and should not be eaten in excess. Cereals can be eaten for snacks as well as for breakfast.  Include foods that contain fiber (fruits, vegetables, whole grains, and legumes). Research shows that fiber may lower blood cholesterol levels, especially the water-soluble fiber found in fruits, vegetables, oat products, and legumes.  FRUITS AND VEGETABLES It is good to eat fruits and vegetables. Besides being sources of fiber, both are rich in vitamins and some minerals. They help you get the daily allowances of these nutrients. Fruits and vegetables can be used for snacks and desserts.  MEATS Limit lean meat, chicken, [place], and fish to no more than 6 ounces per day.  Beef, Pork, and Lamb Use lean cuts of beef, pork, and lamb. Lean cuts include:  Extra-lean ground beef.  Arm roast.  Sirloin tip.  Center-cut ham.  Round steak.  Loin chops.  Rump roast.  Tenderloin.  Trim all fat off the outside of meats  before cooking. It is not necessary to severely decrease the intake of red meat, but lean choices should be made. Lean meat is rich in protein and contains a highly absorbable form of iron. Premenopausal women, in particular, should avoid reducing lean red meat because this could increase the risk for low red blood cells (iron-deficiency anemia).  Chicken and [place] These are good sources of protein. The fat of poultry can be reduced by removing the skin and underlying fat layers before cooking. Chicken and [place] can be substituted for lean red meat in the diet. Poultry should not be fried or covered with high-fat sauces.  Fish and Shellfish Fish is a good source of protein. Shellfish contain cholesterol, but they usually are low in saturated fatty acids. The preparation of fish is important. Like chicken and [place], they should not be fried or covered with high-fat sauces.  EGGS Egg whites contain no fat or cholesterol. They can be eaten often. Try 1 to 2 egg whites instead of  whole eggs in recipes or use egg substitutes that do not contain yolk.  MILK AND DAIRY PRODUCTS Use skim or 1% milk instead of 2% or whole milk. Decrease whole milk, natural, and processed cheeses. Use nonfat or low-fat (2%) cottage cheese or low-fat cheeses made from vegetable oils. Choose nonfat or low-fat (1 to 2%) yogurt. Experiment with evaporated skim milk in recipes that call for heavy cream. Substitute low-fat yogurt or low-fat cottage cheese for sour cream in dips and salad dressings. Have at least 2 servings of low-fat dairy products, such as 2 glasses of skim (or 1%) milk each day to help get your daily calcium intake.  FATS AND OILS Reduce the total intake of fats, especially saturated fat. Butterfat, lard, and beef fats are high in saturated fat and cholesterol. These should be avoided as much as possible. Vegetable fats do not contain cholesterol, but certain vegetable fats, such as coconut oil, palm oil, and  palm kernel oil are very high in saturated fats. These should be limited. These fats are often used in bakery goods, processed foods, popcorn, oils, and nondairy creamers. Vegetable shortenings and some peanut butters contain hydrogenated oils, which are also saturated fats. Read the labels on these foods and check for saturated vegetable oils.  Unsaturated vegetable oils and fats do not raise blood cholesterol. However, they should be limited because they are fats and are high in calories. Total fat should still be limited to 30% of your daily caloric intake. Desirable liquid vegetable oils are corn oil, cottonseed oil, olive oil, canola oil, safflower oil, soybean oil, and sunflower oil. Peanut oil is not as good, but small amounts are acceptable. Buy a heart-healthy tub margarine that has no partially hydrogenated oils in the ingredients. Mayonnaise and salad dressings often are made from unsaturated fats, but they should also be limited because of their high calorie and fat content. Seeds, nuts, peanut butter, olives, and avocados are high in fat, but the fat is mainly the unsaturated type. These foods should be limited mainly to avoid excess calories and fat.  OTHER EATING TIPS Snacks  Most sweets should be limited as snacks. They tend to be rich in calories and fats, and their caloric content outweighs their nutritional value. Some good choices in snacks are graham crackers, melba toast, soda crackers, bagels (no egg), English muffins, fruits, and vegetables. These snacks are preferable to snack crackers, [place] fries, and chips. Popcorn should be air-popped or cooked in small amounts of liquid vegetable oil.  Desserts Eat fruit, low-fat yogurt, and fruit ices instead of pastries, cake, and cookies. Sherbet, angel food cake, gelatin dessert, frozen low-fat yogurt, or other frozen products that do not contain saturated fat (pure fruit juice bars, frozen ice pops) are also acceptable.   COOKING  METHODS Choose those methods that use little or no fat. They include: Poaching.  Braising.  Steaming.  Grilling.  Baking.  Stir-frying.  Broiling.  Microwaving.  Foods can be cooked in a nonstick pan without added fat, or use a nonfat cooking spray in regular cookware. Limit fried foods and avoid frying in saturated fat. Add moisture to lean meats by using water, broth, cooking wines, and other nonfat or low-fat sauces along with the cooking methods mentioned above. Soups and stews should be chilled after cooking. The fat that forms on top after a few hours in the refrigerator should be skimmed off. When preparing meals, avoid using excess salt. Salt can contribute to raising blood pressure in some people.

## 2017-12-27 NOTE — Transfer of Care (Signed)
Immediate Anesthesia Transfer of Care Note  Patient: [name]  Procedure(s) Performed: ESOPHAGOGASTRODUODENOSCOPY (EGD) WITH PROPOFOL (N/A )  Patient Location: PACU  Anesthesia Type:MAC  Level of Consciousness: awake and alert   Airway & Oxygen Therapy: Patient Spontanous Breathing and Patient connected to nasal cannula oxygen  Post-op Assessment: Report given to RN and Post -op Vital signs reviewed and stable  Post vital signs: Reviewed and stable  Last Vitals:  Vitals:   12/27/17 0900 12/27/17 0905  BP:  (!) 143/67  Pulse:    Resp: 17 (!) 22  Temp:    SpO2: 99% 99%    Last Pain:  Vitals:   12/27/17 0807  TempSrc: Oral      Patients Stated Pain Goal: 4 (35/36/14 4315)  Complications: No apparent anesthesia complications

## 2017-12-27 NOTE — Anesthesia Preprocedure Evaluation (Signed)
Anesthesia Evaluation  Patient identified by MRN, date of birth, ID band Patient awake    Reviewed: Allergy & Precautions, H&P , NPO status , Patient's Chart, lab work & pertinent test results  Airway Mallampati: II       Dental  (+) Poor Dentition   Pulmonary shortness of breath and with exertion, asthma , Current Smoker, former smoker,    breath sounds clear to auscultation       Cardiovascular hypertension, Pt. on medications  Rhythm:Regular Rate:Normal     Neuro/Psych Seizures -, Well Controlled,  CVA, Residual Symptoms    GI/Hepatic PUD, GERD  Medicated,(+) Cirrhosis     substance abuse  alcohol use,   Endo/Other    Renal/GU      Musculoskeletal   Abdominal   Peds  Hematology   Anesthesia Other Findings   Reproductive/Obstetrics                             Anesthesia Physical Anesthesia Plan  ASA: III  Anesthesia Plan: MAC   Post-op Pain Management:    Induction: Intravenous  PONV Risk Score and Plan:   Airway Management Planned: Simple Face Mask  Additional Equipment:   Intra-op Plan:   Post-operative Plan:   Informed Consent: I have reviewed the patients History and Physical, chart, labs and discussed the procedure including the risks, benefits and alternatives for the proposed anesthesia with the patient or authorized representative who has indicated his/her understanding and acceptance.     Plan Discussed with:   Anesthesia Plan Comments:         Anesthesia Quick Evaluation

## 2017-12-27 NOTE — Anesthesia Postprocedure Evaluation (Signed)
Anesthesia Post Note  Patient: [name]  Procedure(s) Performed: ESOPHAGOGASTRODUODENOSCOPY (EGD) WITH PROPOFOL (N/A )  Patient location during evaluation: PACU Anesthesia Type: MAC Level of consciousness: awake and alert and oriented Pain management: pain level controlled Vital Signs Assessment: post-procedure vital signs reviewed and stable Respiratory status: spontaneous breathing Cardiovascular status: stable Postop Assessment: no apparent nausea or vomiting Anesthetic complications: no     Last Vitals:  Vitals:   12/27/17 0945 12/27/17 0956  BP: (!) 141/62 132/67  Pulse: 80 71  Resp: 16 16  Temp:  36.7 C  SpO2: 98% 98%    Last Pain:  Vitals:   12/27/17 0956  TempSrc: Oral                 Jax Kentner

## 2017-12-27 NOTE — H&P (Signed)
Primary Care Physician:  [name], MD Primary Gastroenterologist:  Dr. [name]  Pre-Procedure History & Physical: HPI:  [name] is a 62 y.o. female here for DYSPEPSIA: DAILY ETOH/NSAID USE.  Past Medical History:  Diagnosis Date  . Alcohol abuse   . Asthma   . Chronic abdominal pain   . Chronic diarrhea   . Contracture of muscle of hand    right  . GERD (gastroesophageal reflux disease)   . HTN (hypertension)   . PUD (peptic ulcer disease)    remote  . Seizures ([place])    since stroke, no recent seizures  . Shortness of breath    with exertion  . Stroke [facility])    age 67, required brain surgery    Past Surgical History:  Procedure Laterality Date  . ABDOMINAL HYSTERECTOMY  2004   complete  . BRAIN SURGERY     age 3, stroke  . COLONOSCOPY    . COLONOSCOPY WITH PROPOFOL  10/03/2012   Dr. [name]: moderate diverticulosis, small internal hemorrhoids, TUBULAR ADENOMA. Next colonoscopy 2023 per Dr. [name].  . ESOPHAGOGASTRODUODENOSCOPY N/A 10/18/2014   Dr. Gala Romney during inpatient hospitalization: severe exudative/erosive reflux esophagitis as source of trivial upper GI bleed. No varices   . ESOPHAGOGASTRODUODENOSCOPY (EGD) WITH PROPOFOL  10/03/2012   Dr. [name]: stricture at [place] junction s/p dilation, mild gastritis negative H.pylori   . right tib-fib fracture  2008  . SAVORY DILATION  10/03/2012   Procedure: SAVORY DILATION;  Surgeon: Danie Binder, MD;  Location: [facility];  Service: Endoscopy;  Laterality: N/A;  started at 1303,  dilated 12.8-16mm    Prior to Admission medications   Medication Sig Start Date End Date Taking? Authorizing Provider  lisinopril (PRINIVIL,ZESTRIL) 40 MG tablet Take 40 mg by mouth daily.  10/06/17  Yes [provider]  naproxen (NAPROSYN) 500 MG tablet Take 1 tablet (500 mg total) by mouth 2 (two) times daily with a meal. 11/15/17  Yes Sanjuana Kava, MD  omeprazole (PRILOSEC) 20 MG capsule Take 20 mg by mouth daily.  10/06/17   Yes [provider]  pantoprazole (PROTONIX) 40 MG tablet Take 1 tablet (40 mg total) by mouth 2 (two) times daily before a meal. Patient taking differently: Take 40 mg by mouth daily.  12/26/16  Yes [name], MD  phenytoin (DILANTIN) 100 MG ER capsule Take 100 mg by mouth 3 (three) times daily.   Yes [provider]  potassium chloride SA (K-DUR,KLOR-CON) 20 MEQ tablet  10/06/17  Yes [provider]  thiamine 100 MG tablet Take 1 tablet (100 mg total) by mouth daily. 08/02/16  Yes [name], MD  albuterol (PROVENTIL HFA;VENTOLIN HFA) 108 (90 BASE) MCG/ACT inhaler Inhale 2 puffs into the lungs every 6 (six) hours as needed for wheezing or shortness of breath.     [provider]  ENSURE PLUS (ENSURE PLUS) LIQD Take 237 mLs by mouth 3 (three) times daily between meals.    [provider]  folic acid (FOLVITE) 1 MG tablet Take 1 tablet (1 mg total) by mouth daily. 08/02/16   [name], MD  HYDROcodone-acetaminophen (NORCO/VICODIN) 5-325 MG tablet One tablet by mouth every six hours as needed for pain.  Seven day limit per Medicaid guidelines. 11/15/17   Sanjuana Kava, MD  ipratropium-albuterol (DUONEB) 0.5-2.5 (3) MG/3ML SOLN Take 3 mLs by nebulization every 6 (six) hours as needed (for shortness of breath/wheezing).     [provider]  PHENObarbital (LUMINAL) 30 MG tablet Take  30 mg by mouth 2 (two) times daily.    [provider]  potassium chloride 20 MEQ TBCR Take 40 mEq by mouth 2 (two) times daily for 3 days. 12/20/17 12/23/17  Andrya Roppolo, Marga Melnick, MD  solifenacin (VESICARE) 5 MG tablet Take 5 mg by mouth daily.    [provider]    Allergies as of 11/23/2017  . (No Known Allergies)    Family History  Problem Relation Age of Onset  . Liver disease Sister        etoh  . Colon cancer Neg Hx     Social History   Socioeconomic History  . Marital status: Single    Spouse name: Not on file  . Number of  children: 3  . Years of education: Not on file  . Highest education level: Not on file  Social Needs  . Financial resource strain: Not on file  . Food insecurity - worry: Not on file  . Food insecurity - inability: Not on file  . Transportation needs - medical: Not on file  . Transportation needs - non-medical: Not on file  Occupational History  . Occupation: disability    Employer: UNEMPLOYED  Tobacco Use  . Smoking status: Former Smoker    Packs/day: 0.04    Years: 9.00    Pack years: 0.36    Types: Cigarettes    Last attempt to quit: 07/28/2014    Years since quitting: 3.4  . Smokeless tobacco: Never Used  Substance and Sexual Activity  . Alcohol use: Yes    Alcohol/week: 21.0 oz    Types: 35 Cans of beer per week  . Drug use: No  . Sexual activity: Yes    Birth control/protection: Surgical  Other Topics Concern  . Not on file  Social History Narrative  . Not on file    Review of Systems: See HPI, otherwise negative ROS   Physical Exam: BP (!) 141/63   Pulse 85   Temp 98 F (36.7 C) (Oral)   Resp 18   SpO2 95%  General:   Alert,  pleasant and cooperative in NAD Head:  Normocephalic and atraumatic. Neck:  Supple; Lungs:  Clear throughout to auscultation.    Heart:  Regular rate and rhythm. Abdomen:  Soft, nontender and nondistended. Normal bowel sounds, without guarding, and without rebound.   Neurologic:  Alert and  oriented x4;  grossly normal neurologically.  Impression/Plan:     DYSPEPSIA  PLAN:  EGD TODAY DISCUSSED PROCEDURE, BENEFITS, & RISKS: < 1% chance of medication reaction, bleeding, OR perforation.

## 2017-12-27 NOTE — Progress Notes (Signed)
Left vm to return call.    

## 2017-12-27 NOTE — Addendum Note (Signed)
Addendum  created 12/27/17 1014 by Ollen Bowl, CRNA   Charge Capture section accepted

## 2017-12-28 NOTE — Progress Notes (Signed)
LMOM to call and mailing a letter to call.

## 2018-01-02 ENCOUNTER — Encounter (HOSPITAL_COMMUNITY): Payer: Self-pay | Admitting: Gastroenterology

## 2018-01-04 ENCOUNTER — Telehealth: Payer: Self-pay | Admitting: Gastroenterology

## 2018-01-04 ENCOUNTER — Other Ambulatory Visit (HOSPITAL_COMMUNITY): Payer: Self-pay | Admitting: Internal Medicine

## 2018-01-04 DIAGNOSIS — Z1231 Encounter for screening mammogram for malignant neoplasm of breast: Secondary | ICD-10-CM

## 2018-01-04 NOTE — Telephone Encounter (Signed)
Pt is aware of liver panel results.

## 2018-01-04 NOTE — Telephone Encounter (Signed)
PATIENT CALLED BECAUSE SHE RECEIVED A LETTER, PLEASE CALL BACK

## 2018-01-12 ENCOUNTER — Ambulatory Visit (HOSPITAL_COMMUNITY)
Admission: RE | Admit: 2018-01-12 | Discharge: 2018-01-12 | Disposition: A | Discharge status: Home / Self Care | Payer: Medicare Other | Source: Ambulatory Visit | Attending: Internal Medicine | Admitting: Internal Medicine

## 2018-01-12 DIAGNOSIS — Z1231 Encounter for screening mammogram for malignant neoplasm of breast: Secondary | ICD-10-CM | POA: Insufficient documentation

## 2018-03-14 ENCOUNTER — Ambulatory Visit (INDEPENDENT_AMBULATORY_CARE_PROVIDER_SITE_OTHER): Payer: Medicare Other | Admitting: Orthopaedic Surgery

## 2018-03-14 ENCOUNTER — Encounter: Payer: Self-pay | Admitting: Orthopaedic Surgery

## 2018-03-14 VITALS — BP 113/64 | HR 90 | Ht 64.0 in | Wt 165.0 lb

## 2018-03-14 DIAGNOSIS — G8929 Other chronic pain: Secondary | ICD-10-CM | POA: Diagnosis not present

## 2018-03-14 DIAGNOSIS — M25511 Pain in right shoulder: Secondary | ICD-10-CM

## 2018-03-14 DIAGNOSIS — R531 Weakness: Secondary | ICD-10-CM | POA: Diagnosis not present

## 2018-03-14 MED ORDER — HYDROCODONE-ACETAMINOPHEN 5-325 MG PO TABS: ORAL_TABLET | ORAL | 0 refills | Status: DC

## 2018-03-14 NOTE — Progress Notes (Signed)
Patient [name], female DOB:[birth year], 62 y.o. GNF:[id number]  Chief Complaint  Patient presents with  . Follow-up    Left Shoulder Pain    HPI  [name] is a 62 y.o. 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  She has hemiparesis on the right.  She tries to do her exercises daily.  She has no swelling.  She has no new trauma.  Her pain varies day to day.  It has been worse this last month. HPI  Body mass index is 28.32 kg/m.  ROS  Review of Systems  Constitutional:       Patient does not have Diabetes Mellitus. Patient has hypertension. Patient has COPD or shortness of breath. Patient does not have BMI > 35. Patient does not have current smoking history.  Respiratory: Negative for shortness of breath.   Cardiovascular: Negative for chest pain.  Endocrine: Positive for cold intolerance.  Musculoskeletal: Positive for arthralgias, gait problem, myalgias and neck pain.  Allergic/Immunologic: Positive for environmental allergies.  Neurological: Positive for weakness (right side) and headaches.  All other systems reviewed and are negative.   Past Medical History:  Diagnosis Date  . Alcohol abuse   . Asthma   . Chronic abdominal pain   . Chronic diarrhea   . Contracture of muscle of hand    right  . GERD (gastroesophageal reflux disease)   . HTN (hypertension)   . PUD (peptic ulcer disease)    remote  . Seizures ([place])    since stroke, no recent seizures  . Shortness of breath    with exertion  . 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 ABDOMINAL HYSTERECTOMY  2004   complete  . BRAIN SURGERY     age 72, stroke  . COLONOSCOPY    . COLONOSCOPY WITH PROPOFOL  10/03/2012   Dr. [name]: moderate diverticulosis, small internal hemorrhoids, TUBULAR ADENOMA. Next colonoscopy 2023 per Dr. [name].  . ESOPHAGOGASTRODUODENOSCOPY N/A 10/18/2014   Dr. Gala Romney during inpatient  hospitalization: severe exudative/erosive reflux esophagitis as source of trivial upper GI bleed. No varices   . ESOPHAGOGASTRODUODENOSCOPY (EGD) WITH PROPOFOL  10/03/2012   Dr. [name]: stricture at [place] junction s/p dilation, mild gastritis negative H.pylori   . ESOPHAGOGASTRODUODENOSCOPY (EGD) WITH PROPOFOL N/A 12/27/2017   Procedure: ESOPHAGOGASTRODUODENOSCOPY (EGD) WITH PROPOFOL;  Surgeon: Danie Binder, MD;  [facility];  Service: Endoscopy;  Laterality: N/A;  10:15am  . right tib-fib fracture  2008  . SAVORY DILATION  10/03/2012   Procedure: SAVORY DILATION;  Surgeon: Danie Binder, MD;  Location: [facility] ORS;  Service: Endoscopy;  Laterality: N/A;  started at 1303,  dilated 12.8-16mm    Family History  Problem Relation Age of Onset  . Liver disease Sister        etoh  . Colon cancer Neg Hx     Social History Social History   Tobacco Use  . Smoking status: Former Smoker    Packs/day: 0.04    Years: 9.00    Pack years: 0.36    Types: Cigarettes    Last attempt to quit: 07/28/2014    Years since quitting: 3.6  . Smokeless tobacco: Never Used  Substance Use Topics  . Alcohol use: Yes    Alcohol/week: 21.0 oz    Types: 35 Cans of beer per week  . Drug use: No    No Known Allergies  Current Outpatient Medications  Medication Sig Dispense Refill  . albuterol (PROVENTIL HFA;VENTOLIN HFA) 108 (90 BASE) MCG/ACT inhaler Inhale 2 puffs into the lungs every 6 (six) hours as needed for wheezing or shortness of breath.     . ENSURE PLUS (ENSURE PLUS) LIQD Take 237 mLs by mouth 3 (three) times daily between meals.    . folic acid (FOLVITE) 1 MG tablet Take 1 tablet (1 mg total) by mouth daily. 30 tablet 3  . HYDROcodone-acetaminophen (NORCO/VICODIN) 5-325 MG tablet One tablet by mouth every six hours as needed for pain.  Seven day limit per Medicaid guidelines. 28 tablet 0  . ipratropium-albuterol (DUONEB) 0.5-2.5 (3) MG/3ML SOLN Take 3 mLs by nebulization every 6 (six)  hours as needed (for shortness of breath/wheezing).     Marland Kitchen lisinopril (PRINIVIL,ZESTRIL) 40 MG tablet Take 40 mg by mouth daily.     . naproxen (NAPROSYN) 500 MG tablet Take 1 tablet (500 mg total) by mouth 2 (two) times daily with a meal. 60 tablet 5  . omeprazole (PRILOSEC) 20 MG capsule Take 20 mg by mouth daily.     . pantoprazole (PROTONIX) 40 MG tablet Take 1 tablet (40 mg total) by mouth 2 (two) times daily before a meal. (Patient taking differently: Take 40 mg by mouth daily. ) 60 tablet 3  . PHENObarbital (LUMINAL) 30 MG tablet Take 30 mg by mouth 2 (two) times daily.    . phenytoin (DILANTIN) 100 MG ER capsule Take 100 mg by mouth 3 (three) times daily.    . potassium chloride 20 MEQ TBCR Take 40 mEq by mouth 2 (two) times daily for 3 days. 12 tablet 0  . potassium chloride SA (K-DUR,KLOR-CON) 20 MEQ tablet     . solifenacin (VESICARE) 5 MG tablet Take 5 mg by mouth daily.    Marland Kitchen thiamine 100 MG tablet Take 1 tablet (100 mg total) by mouth daily. 30 tablet 3   No current facility-administered medications for this visit.      Physical Exam  Blood pressure 113/64, pulse 90, height 5\' 4"  (1.626 m), weight 165 lb (74.8 kg).  Constitutional: overall normal hygiene, normal nutrition, well developed, normal grooming, normal body habitus. Assistive device:none  Musculoskeletal: gait and station Limp right, muscle tone and strength are normal on the left side, atrophy and decreased motion on the right side.,  Neurological: coordination with limp to the left.  She has right sided hemiparesis and gait affected by that.  She has atrophy on the right side with deformity of the right hand.    Skin:   Normal overall no scars, lesions, ulcers or rashes. No psoriasis.  Psychiatric: Alert and oriented x 3.  Recent memory intact, remote memory unclear.  Normal mood and affect. Well groomed.  Good eye contact.  Cardiovascular: overall no swelling, no varicosities, no edema bilaterally, normal  temperatures of the legs and arms, no clubbing, cyanosis and good capillary refill.  Lymphatic: palpation is normal.  All other systems reviewed and are negative   The patient has been educated about the nature of the problem(s) and counseled on treatment options.  The patient appeared to understand what I have discussed and is in agreement with it.  Encounter Diagnoses  Name Primary?  . Chronic right shoulder pain Yes  . Right sided weakness      PLAN Call if any problems.  Precautions discussed.  Continue current medications.   Return to clinic 3 months   I have reviewed the [place]  Controlled Substance Reporting System web site prior to prescribing narcotic medicine for this patient.  Electronically Signed Sanjuana Kava, MD 5/28/20199:02 AM

## 2018-06-08 ENCOUNTER — Encounter: Payer: Self-pay | Admitting: Gastroenterology

## 2018-06-14 ENCOUNTER — Ambulatory Visit (INDEPENDENT_AMBULATORY_CARE_PROVIDER_SITE_OTHER): Payer: Medicare Other | Admitting: Orthopaedic Surgery

## 2018-06-14 ENCOUNTER — Ambulatory Visit (INDEPENDENT_AMBULATORY_CARE_PROVIDER_SITE_OTHER): Payer: Medicare Other

## 2018-06-14 ENCOUNTER — Encounter: Payer: Self-pay | Admitting: Orthopaedic Surgery

## 2018-06-14 VITALS — BP 105/56 | HR 99 | Ht 64.0 in | Wt 164.0 lb

## 2018-06-14 DIAGNOSIS — G8929 Other chronic pain: Secondary | ICD-10-CM | POA: Diagnosis not present

## 2018-06-14 DIAGNOSIS — M25511 Pain in right shoulder: Secondary | ICD-10-CM

## 2018-06-14 DIAGNOSIS — I6789 Other cerebrovascular disease: Secondary | ICD-10-CM

## 2018-06-14 DIAGNOSIS — M25561 Pain in right knee: Secondary | ICD-10-CM | POA: Diagnosis not present

## 2018-06-14 DIAGNOSIS — I639 Cerebral infarction, unspecified: Secondary | ICD-10-CM | POA: Insufficient documentation

## 2018-06-14 MED ORDER — HYDROCODONE-ACETAMINOPHEN 5-325 MG PO TABS: ORAL_TABLET | ORAL | 0 refills | Status: DC

## 2018-06-14 NOTE — Progress Notes (Signed)
Patient CZ:YSAYTK [name], female DOB:June 18, 1956, 62 y.o. ZSW:[id number]  Chief Complaint  Patient presents with  . Shoulder Pain    Right shoulder pain getting worse.    HPI  [name] is a 62 y.o. female who has chronic right shoulder pain.  She has had a stroke in the past and has marked weakness of the right upper extremity with deformity of the right hand.  She has had more pain in the shoulder over the last several weeks with all the rain we have had.  She has no new trauma, no increase in her nerve pain.  She has developed pain in the right knee with swelling and popping.  She has no trauma.  She has feeling the knee may give way but it has not.  She has no redness.  She has no distal edema.  She has some weakness of the right lower extremity but not nearly as much as the upper.   Body mass index is 28.15 kg/m.  ROS  Review of Systems  Constitutional: Positive for activity change.       Patient does not have Diabetes Mellitus. Patient has hypertension. Patient has COPD or shortness of breath. Patient does not have BMI > 35. Patient does not have current smoking history.  Respiratory: Negative for shortness of breath.   Cardiovascular: Negative for chest pain.  Endocrine: Positive for cold intolerance.  Musculoskeletal: Positive for arthralgias, gait problem, joint swelling, myalgias and neck pain.  Allergic/Immunologic: Positive for environmental allergies.  Neurological: Positive for weakness (right side) and headaches.  All other systems reviewed and are negative.   All other systems reviewed and are negative.  The following is a summary of the past history medically, past history surgically, known current medicines, social history and family history.  This information is gathered electronically by the computer from prior information and documentation.  I review this each visit and have found including this information at this point in the chart is beneficial and  informative.    Past Medical History:  Diagnosis Date  . Alcohol abuse   . Asthma   . Chronic abdominal pain   . Chronic diarrhea   . Contracture of muscle of hand    right  . GERD (gastroesophageal reflux disease)   . HTN (hypertension)   . PUD (peptic ulcer disease)    remote  . Seizures ([place])    since stroke, no recent seizures  . Shortness of breath    with exertion  . Stroke [facility])    age 9, required brain surgery    Past Surgical History:  Procedure Laterality Date  . ABDOMINAL HYSTERECTOMY  2004   complete  . BRAIN SURGERY     age 40, stroke  . COLONOSCOPY    . COLONOSCOPY WITH PROPOFOL  10/03/2012   Dr. [name]: moderate diverticulosis, small internal hemorrhoids, TUBULAR ADENOMA. Next colonoscopy 2023 per Dr. [name].  . ESOPHAGOGASTRODUODENOSCOPY N/A 10/18/2014   Dr. Gala Romney during inpatient hospitalization: severe exudative/erosive reflux esophagitis as source of trivial upper GI bleed. No varices   . ESOPHAGOGASTRODUODENOSCOPY (EGD) WITH PROPOFOL  10/03/2012   Dr. [name]: stricture at [place] junction s/p dilation, mild gastritis negative H.pylori   . ESOPHAGOGASTRODUODENOSCOPY (EGD) WITH PROPOFOL N/A 12/27/2017   Procedure: ESOPHAGOGASTRODUODENOSCOPY (EGD) WITH PROPOFOL;  Surgeon: Danie Binder, MD;  Location[facility];  Service: Endoscopy;  Laterality: N/A;  10:15am  . right tib-fib fracture  2008  . SAVORY DILATION  10/03/2012   Procedure: SAVORY DILATION;  Surgeon: Danie Binder, MD;  Location: [facility] ORS;  Service: Endoscopy;  Laterality: N/A;  started at 1303,  dilated 12.8-16mm    Family History  Problem Relation Age of Onset  . Liver disease Sister        etoh  . Colon cancer Neg Hx     Social History Social History   Tobacco Use  . Smoking status: Former Smoker    Packs/day: 0.04    Years: 9.00    Pack years: 0.36    Types: Cigarettes    Last attempt to quit: 07/28/2014    Years since quitting: 3.8  . Smokeless tobacco: Never Used  Substance  Use Topics  . Alcohol use: Yes    Alcohol/week: 35.0 standard drinks    Types: 35 Cans of beer per week  . Drug use: No    No Known Allergies  Current Outpatient Medications  Medication Sig Dispense Refill  . albuterol (PROVENTIL HFA;VENTOLIN HFA) 108 (90 BASE) MCG/ACT inhaler Inhale 2 puffs into the lungs every 6 (six) hours as needed for wheezing or shortness of breath.     . ENSURE PLUS (ENSURE PLUS) LIQD Take 237 mLs by mouth 3 (three) times daily between meals.    . folic acid (FOLVITE) 1 MG tablet Take 1 tablet (1 mg total) by mouth daily. 30 tablet 3  . HYDROcodone-acetaminophen (NORCO/VICODIN) 5-325 MG tablet One tablet by mouth every six hours as needed for pain.  Seven day limit per Medicaid guidelines. 28 tablet 0  . ipratropium-albuterol (DUONEB) 0.5-2.5 (3) MG/3ML SOLN Take 3 mLs by nebulization every 6 (six) hours as needed (for shortness of breath/wheezing).     Marland Kitchen lisinopril (PRINIVIL,ZESTRIL) 40 MG tablet Take 40 mg by mouth daily.     . naproxen (NAPROSYN) 500 MG tablet Take 1 tablet (500 mg total) by mouth 2 (two) times daily with a meal. 60 tablet 5  . omeprazole (PRILOSEC) 20 MG capsule Take 20 mg by mouth daily.     . pantoprazole (PROTONIX) 40 MG tablet Take 1 tablet (40 mg total) by mouth 2 (two) times daily before a meal. (Patient taking differently: Take 40 mg by mouth daily. ) 60 tablet 3  . PHENObarbital (LUMINAL) 30 MG tablet Take 30 mg by mouth 2 (two) times daily.    . phenytoin (DILANTIN) 100 MG ER capsule Take 100 mg by mouth 3 (three) times daily.    . potassium chloride 20 MEQ TBCR Take 40 mEq by mouth 2 (two) times daily for 3 days. 12 tablet 0  . potassium chloride SA (K-DUR,KLOR-CON) 20 MEQ tablet     . solifenacin (VESICARE) 5 MG tablet Take 5 mg by mouth daily.    Marland Kitchen thiamine 100 MG tablet Take 1 tablet (100 mg total) by mouth daily. 30 tablet 3   No current facility-administered medications for this visit.      Physical Exam  Blood pressure (!)  105/56, pulse 99, height 5\' 4"  (1.626 m), weight 164 lb (74.4 kg).  Constitutional: overall normal hygiene, normal nutrition, well developed, normal grooming, normal body habitus. Assistive device:none  Musculoskeletal: gait and station Limp right, muscle tone and strength are normal on the left, the right upper extremity has muscle weakness and atrophy post stroke and deformity of the right hand, no tremors or atrophy is present on the left side.  .  Neurological: coordination abnormal right side.  Deep tendon reflex/nerve stretch intact.  Sensation normal.  Cranial nerves II-XII intact.   Skin:  Normal overall no scars, lesions, ulcers or rashes. No psoriasis.  Psychiatric: Alert and oriented x 3.  Recent memory intact, remote memory unclear.  Normal mood and affect. Well groomed.  Good eye contact.  Cardiovascular: overall no swelling, no varicosities, no edema bilaterally, normal temperatures of the legs and arms, no clubbing, cyanosis and good capillary refill.  Lymphatic: palpation is normal.  She has very limited motion of the right shoulder today with pain.  She has slight effusion of the shoulder, no redness.  She has weakness of the right upper extremity and deformity of hand post stroke.  The right knee has effusion 1+, crepitus, ROM 0 to 100 with pain, pain medial joint, positive medial Mc[place]ray, limp to the right with some slight weakness of the right lower extremity secondary to old stroke. All other systems reviewed and are negative   The patient has been educated about the nature of the problem(s) and counseled on treatment options.  The patient appeared to understand what I have discussed and is in agreement with it.  Encounter Diagnoses  Name Primary?  . Chronic pain of right knee Yes  . Chronic right shoulder pain   . STROKE-With also h/o Aneurysm clipping?    X-rays were done of the right knee, reported separately.  PLAN Call if any problems.  Precautions  discussed.  Continue current medications.   PROCEDURE NOTE:  The patient requests injections of the right knee , verbal consent was obtained.  The right knee was prepped appropriately after time out was performed.   Sterile technique was observed and injection of 1 cc of Depo-Medrol 40 mg with several cc's of plain xylocaine. Anesthesia was provided by ethyl chloride and a 20-gauge needle was used to inject the knee area. The injection was tolerated well.  A band aid dressing was applied.  The patient was advised to apply ice later today and tomorrow to the injection sight as needed.  Return to clinic 3 months   I have reviewed the [place] web site prior to prescribing narcotic medicine for this patient.  Electronically Signed Sanjuana Kava, MD 8/28/20198:49 AM

## 2018-06-21 ENCOUNTER — Other Ambulatory Visit: Payer: Self-pay | Admitting: Gastroenterology

## 2018-06-21 ENCOUNTER — Encounter: Payer: Self-pay | Admitting: Gastroenterology

## 2018-06-21 ENCOUNTER — Ambulatory Visit (INDEPENDENT_AMBULATORY_CARE_PROVIDER_SITE_OTHER): Payer: Medicare Other | Admitting: Gastroenterology

## 2018-06-21 DIAGNOSIS — R1010 Upper abdominal pain, unspecified: Secondary | ICD-10-CM

## 2018-06-21 DIAGNOSIS — K7030 Alcoholic cirrhosis of liver without ascites: Secondary | ICD-10-CM

## 2018-06-21 NOTE — Assessment & Plan Note (Signed)
YOUR LEFT UPPER ABDOMINAL PAIN IS DUE TO REFLUX, ETOH/NSAID GASTRITIS, AND CONSTIPATION.   TO REDUCE ABDOMINAL PAIN:    1. DRINK WATER TO KEEP YOUR URINE LIGHT YELLOW.    2. FOLLOW A HIGH FIBER DIET.     3. ADD LINZESS 30 MINS PRIOR TO BREAKFAST. YOU SHOULD HAVE A BM 1-3 HRS AFTER THE DOSE IF NOT TAKE THE LINZESS WITH BREAKFAST. IT CAN CAUSE EXPLOSIVE DIARRHEA.    4. CONTINUE PROTONIX. TAKE 30 MINUTES PRIOR TO BREAKFAST AND SUPPER.  DO NOT TAKE NAPROXEN AND IBUPROFEN IN THE SAME DAY. IT CAN CAUSE INTERNAL BLEEDING AND WILL KILL YOUR KIDNEYS. COMPLETE LABS AND ULTRASOUND WITHIN THE NEXT MONTH.   FOLLOW UP IN 6 MOS.

## 2018-06-21 NOTE — Progress Notes (Signed)
cc'ed to pcp °

## 2018-06-21 NOTE — Progress Notes (Signed)
Subjective:    Patient ID: [name], female    DOB: Apr 03, 1956, 62 y.o.   MRN: [id number]  HPI Pain IN LUQ-BETTER WITH BMs. FEELING CONSTIPATED. BM: LAST ONE, SML ONE,. FELT LIKE NOT COMPLETELY EMPTY. COMPLETE EMPTYING: HARD TO TELL. EATING FIBER. DRINKING WATER. CUT DOWN ON ETOH. TRIED: IBUPROFEN  OTC 2 A DAY WITH FOOD. ALSO TAKING NAPROXEN BID.  APPETITE: GOOD. HAD NASAL CONGESTION. USE NEBS/INHALER FOR CHEST CONGESTION. COMPANION SMOKES INSIDE. VOMITS: WITH PHLEGM AND HAS TO GET IT UP, GAGS UNTIL SHE THROWS UP.   PT DENIES FEVER, CHILLS, HEMATOCHEZIA, HEMATEMESIS, nausea, vomiting, melena, diarrhea, CHEST PAIN, CHANGE IN BOWEL IN HABITS, problems swallowing, problems with sedation, OR heartburn.  Past Medical History:  Diagnosis Date  . Alcohol abuse   . Asthma   . Contracture of muscle of hand    right  . GERD (gastroesophageal reflux disease)   . HTN (hypertension)   . PUD (peptic ulcer disease)    remote  . Seizures ([place])    since stroke, no recent seizures  . Stroke [facility])    age 73, required brain surgery    Past Surgical History:  Procedure Laterality Date  . ABDOMINAL HYSTERECTOMY  2004   complete  . BRAIN SURGERY     age 7, stroke  . COLONOSCOPY    . COLONOSCOPY WITH PROPOFOL  10/03/2012   Dr. [name]: moderate diverticulosis, small internal hemorrhoids, TUBULAR ADENOMA. Next colonoscopy 2023 per Dr. [name].  . ESOPHAGOGASTRODUODENOSCOPY N/A 10/18/2014   Dr. Gala Romney during inpatient hospitalization: severe exudative/erosive reflux esophagitis as source of trivial upper GI bleed. No varices   . ESOPHAGOGASTRODUODENOSCOPY (EGD) WITH PROPOFOL  10/03/2012   Dr. [name]: stricture at [place] junction s/p dilation, mild gastritis negative H.pylori   . ESOPHAGOGASTRODUODENOSCOPY (EGD) WITH PROPOFOL N/A 12/27/2017   Procedure: ESOPHAGOGASTRODUODENOSCOPY (EGD) WITH PROPOFOL;  Surgeon: Danie Binder, MD;  [facility];  Service: Endoscopy;  Laterality: N/A;  10:15am   . right tib-fib fracture  2008  . SAVORY DILATION  10/03/2012   Procedure: SAVORY DILATION;  Surgeon: Danie Binder, MD;  Location: [facility] ORS;  Service: Endoscopy;  Laterality: N/A;  started at 1303,  dilated 12.8-16mm   No Known Allergies  Current Outpatient Medications  Medication Sig    . albuterol 108 (90 BASE) MCG/ACT inhaler Inhale 2 puffs into the lungs Q6H PRN for wheezing or shortness of breath.     . ENSURE PLUS  Take 237 mLs by mouth 3 (three) times daily between meals.    . folic acid 1 MG tablet Take 1 tablet (1 mg total) by mouth daily.    . NORCO/VICODIN 5-325 MG tablet OUT OF THEM    . iDUONEB 0.5-2.5 NEBS SOLN Take 3 mLs Q6H PRN (for shortness of breath/wheezing).     Marland Kitchen lisinopril 40 MG tablet Take 40 mg by mouth daily.     . naproxen 500 MG tablet Take 1 tablet (500 mg total) by mouth 2 (two) times daily with a meal.    . pantoprazole 40 MG tablet Take 40 mg by mouth daily.     . phenytoin 100 MG ER capsule Take 100 mg by mouth 3 (three) times daily.    . potassium chloride SA 20 MEQ  Take 20 mEq by mouth 2 (two) times daily.     . solifenacin 5 MG tablet Take 5 mg by mouth daily.    .      . PHENObarbital 30 MG tablet Take  30 mg by mouth 2 (two) times daily.    Marland Kitchen KCL 20 MEQ TBCR Take 40 mEq by mouth 2 (two) times daily for 3 days.    .       Review of Systems PER HPI OTHERWISE ALL SYSTEMS ARE NEGATIVE.    Objective:   Physical Exam  Constitutional: She is oriented to person, place, and time. She appears well-developed and well-nourished. No distress.  HENT:  Head: Normocephalic and atraumatic.  Mouth/Throat: Oropharynx is clear and moist. No oropharyngeal exudate.  POOR DENTITION  Eyes: Pupils are equal, round, and reactive to light. No scleral icterus.  Neck: Normal range of motion. Neck supple.  Cardiovascular: Normal rate, regular rhythm and normal heart sounds.  Pulmonary/Chest: Effort normal and breath sounds normal. No respiratory distress.  Abdominal:  Soft. Bowel sounds are normal. She exhibits no distension. There is tenderness. There is no rebound and no guarding.  MILD TTP IN THE LUQS.  Musculoskeletal: She exhibits no edema.  Lymphadenopathy:    She has no cervical adenopathy.  Neurological: She is alert and oriented to person, place, and time.  NO  NEW FOCAL DEFICITS  Psychiatric: She has a normal mood and affect.  Vitals reviewed.     Assessment & Plan:

## 2018-06-21 NOTE — Assessment & Plan Note (Addendum)
WELL COMPENSATED DISEASE. CONTINUES TO DRINK ETOH BUT 2-3 A DAY.  DO NOT TAKE NAPROXEN AND IBUPROFEN IN THE SAME DAY. IT CAN CAUSE INTERNAL BLEEDING AND WILL KILL YOUR KIDNEYS. COMPLETE LABS AND ULTRASOUND WITHIN THE NEXT MONTH.  U/S Q6 MOS. FOLLOW UP IN 6 MOS.

## 2018-06-21 NOTE — Patient Instructions (Addendum)
YOUR LEFT UPPER ABDOMINAL PAIN IS DUE TO REFLUX, GASTRITIS, AND CONSTIPATION.   TO REDUCE ABDOMINAL PAIN:    1. DRINK WATER TO KEEP YOUR URINE LIGHT YELLOW.     2. FOLLOW A HIGH FIBER DIET.      3. ADD LINZESS 30 MINS PRIOR TO BREAKFAST. YOU SHOULD HAVE A BM 1-3 HRS AFTER THE DOSE IF NOT TAKE THE LINZESS WITH BREAKFAST. IT CAN CAUSE EXPLOSIVE DIARRHEA.     4. CONTINUE PROTONIX. TAKE 30 MINUTES PRIOR TO BREAKFAST AND SUPPER.   DO NOT TAKE NAPROXEN AND IBUPROFEN IN THE SAME DAY. IT CAN CAUSE INTERNAL BLEEDING AND WILL KILL YOUR KIDNEYS.    COMPLETE LABS AND ULTRASOUND WITHIN THE NEXT MONTH.   FOLLOW UP IN 6 MOS.

## 2018-06-21 NOTE — Patient Instructions (Signed)
Attempted to submit PA for [place] RUQ via [facility]. No PA needed. Per [facility], pt doesn't need PA for outpatient radiology.

## 2018-06-22 ENCOUNTER — Telehealth: Payer: Self-pay | Admitting: Gastroenterology

## 2018-06-22 ENCOUNTER — Encounter: Payer: Self-pay | Admitting: Orthopaedic Surgery

## 2018-06-22 NOTE — Progress Notes (Signed)
ON RECALL  °

## 2018-06-22 NOTE — Telephone Encounter (Signed)
PT said the Linzess and Protonix has not been sent to the pharmacy. Dr. [name], please advise!

## 2018-06-22 NOTE — Telephone Encounter (Signed)
Pt was seen yesterday and called today asking to speak to the nurse about a medication. [phone]

## 2018-06-23 MED ORDER — PANTOPRAZOLE SODIUM 40 MG PO TBEC: DELAYED_RELEASE_TABLET | ORAL | 11 refills | Status: DC

## 2018-06-23 MED ORDER — LINACLOTIDE 145 MCG PO CAPS: ORAL_CAPSULE | ORAL | 11 refills | Status: DC

## 2018-06-23 NOTE — Addendum Note (Signed)
Addended by: Danie Binder on: 06/23/2018 08:59 AM   Modules accepted: Orders

## 2018-06-23 NOTE — Telephone Encounter (Signed)
Pt is aware.  

## 2018-06-23 NOTE — Telephone Encounter (Signed)
PLEASE CALL PT.  Rx sent.  

## 2018-07-03 ENCOUNTER — Ambulatory Visit (HOSPITAL_COMMUNITY)
Admission: RE | Admit: 2018-07-03 | Discharge: 2018-07-03 | Disposition: A | Discharge status: Home / Self Care | Payer: Medicare Other | Source: Ambulatory Visit | Attending: Gastroenterology | Admitting: Gastroenterology

## 2018-07-03 ENCOUNTER — Other Ambulatory Visit (HOSPITAL_COMMUNITY)
Admission: RE | Admit: 2018-07-03 | Discharge: 2018-07-03 | Disposition: A | Discharge status: Home / Self Care | Payer: Medicare Other | Source: Ambulatory Visit | Attending: Gastroenterology | Admitting: Gastroenterology

## 2018-07-03 DIAGNOSIS — R932 Abnormal findings on diagnostic imaging of liver and biliary tract: Secondary | ICD-10-CM | POA: Diagnosis not present

## 2018-07-03 DIAGNOSIS — K7030 Alcoholic cirrhosis of liver without ascites: Secondary | ICD-10-CM | POA: Insufficient documentation

## 2018-07-03 LAB — CBC WITH DIFFERENTIAL/PLATELET
Basophils Absolute: 0 10*3/uL (ref 0.0–0.1)
Basophils Relative: 0 %
Eosinophils Absolute: 0.1 10*3/uL (ref 0.0–0.7)
Eosinophils Relative: 2 %
HCT: 31.4 % — ABNORMAL LOW (ref 36.0–46.0)
Hemoglobin: 10.3 g/dL — ABNORMAL LOW (ref 12.0–15.0)
Lymphocytes Relative: 33 %
Lymphs Abs: 1.7 10*3/uL (ref 0.7–4.0)
MCH: 30.7 pg (ref 26.0–34.0)
MCHC: 32.8 g/dL (ref 30.0–36.0)
MCV: 93.7 fL (ref 78.0–100.0)
Monocytes Absolute: 0.6 10*3/uL (ref 0.1–1.0)
Monocytes Relative: 12 %
Neutro Abs: 2.8 10*3/uL (ref 1.7–7.7)
Neutrophils Relative %: 53 %
Platelets: 127 10*3/uL — ABNORMAL LOW (ref 150–400)
RBC: 3.35 MIL/uL — ABNORMAL LOW (ref 3.87–5.11)
RDW: 14.4 % (ref 11.5–15.5)
WBC: 5.2 10*3/uL (ref 4.0–10.5)

## 2018-07-03 LAB — COMPREHENSIVE METABOLIC PANEL
ALT: 38 U/L (ref 0–44)
AST: 67 U/L — ABNORMAL HIGH (ref 15–41)
Albumin: 3.8 g/dL (ref 3.5–5.0)
Alkaline Phosphatase: 125 U/L (ref 38–126)
Anion gap: 11 (ref 5–15)
BUN: 12 mg/dL (ref 8–23)
CO2: 26 mmol/L (ref 22–32)
Calcium: 9 mg/dL (ref 8.9–10.3)
Chloride: 98 mmol/L (ref 98–111)
Creatinine, Ser: 1.12 mg/dL — ABNORMAL HIGH (ref 0.44–1.00)
GFR calc Af Amer: >60 mL/min (ref >60)
GFR calc non Af Amer: 52 mL/min — ABNORMAL LOW (ref >60)
Glucose, Bld: 100 mg/dL — ABNORMAL HIGH (ref 70–99)
Potassium: 3.4 mmol/L — ABNORMAL LOW (ref 3.5–5.1)
Sodium: 135 mmol/L (ref 135–145)
Total Bilirubin: 0.9 mg/dL (ref 0.3–1.2)
Total Protein: 7.7 g/dL (ref 6.5–8.1)

## 2018-07-05 NOTE — Progress Notes (Signed)
PT is aware of all of the info from Dr. [name]. Rx of KCL called to [place] at [facility].  Forwarding to Manuela Schwartz to send labs to PCP and nic [place] in 6 months.

## 2018-07-06 ENCOUNTER — Ambulatory Visit: Payer: Medicaid Other | Admitting: Gastroenterology

## 2018-07-06 NOTE — Progress Notes (Signed)
CC'D TO PCP AND ON RECALL  °

## 2018-09-06 ENCOUNTER — Ambulatory Visit (INDEPENDENT_AMBULATORY_CARE_PROVIDER_SITE_OTHER): Payer: Medicare Other | Admitting: Orthopaedic Surgery

## 2018-09-06 ENCOUNTER — Encounter: Payer: Self-pay | Admitting: Orthopaedic Surgery

## 2018-09-06 VITALS — BP 142/70 | HR 101 | Ht 64.0 in | Wt 170.0 lb

## 2018-09-06 DIAGNOSIS — M25561 Pain in right knee: Secondary | ICD-10-CM

## 2018-09-06 DIAGNOSIS — I6789 Other cerebrovascular disease: Secondary | ICD-10-CM

## 2018-09-06 DIAGNOSIS — G8929 Other chronic pain: Secondary | ICD-10-CM

## 2018-09-06 DIAGNOSIS — M25511 Pain in right shoulder: Secondary | ICD-10-CM

## 2018-09-06 DIAGNOSIS — R531 Weakness: Secondary | ICD-10-CM

## 2018-09-06 MED ORDER — HYDROCODONE-ACETAMINOPHEN 5-325 MG PO TABS: ORAL_TABLET | ORAL | 0 refills | Status: DC

## 2018-09-06 NOTE — Progress Notes (Signed)
CC:  I have pain of my right knee. I would like an injection.  The patient has chronic pain of the right knee.  There is no recent trauma.  There is no redness.  Injections in the past have helped.  The knee has no redness, has an effusion and crepitus present.  ROM of the right knee is 0-100.  Impression:  Chronic knee pain right  Return: 3 months  PROCEDURE NOTE:  The patient requests injections of the right knee , verbal consent was obtained.  The right knee was prepped appropriately after time out was performed.   Sterile technique was observed and injection of 1 cc of Depo-Medrol 40 mg with several cc's of plain xylocaine. Anesthesia was provided by ethyl chloride and a 20-gauge needle was used to inject the knee area. The injection was tolerated well.  A band aid dressing was applied.  The patient was advised to apply ice later today and tomorrow to the injection sight as needed.  I have reviewed the [place] web site prior to prescribing narcotic medicine for this patient.   Electronically Signed Sanjuana Kava, MD 11/20/20198:47 AM

## 2018-09-13 ENCOUNTER — Ambulatory Visit: Payer: Medicare Other | Admitting: Orthopaedic Surgery

## 2018-11-07 ENCOUNTER — Telehealth: Payer: Self-pay | Admitting: Gastroenterology

## 2018-11-07 NOTE — Telephone Encounter (Signed)
Letter sent.

## 2018-11-07 NOTE — Telephone Encounter (Signed)
Recall for ultrasound 

## 2018-11-15 ENCOUNTER — Ambulatory Visit: Payer: Medicare Other | Admitting: Gastroenterology

## 2018-12-04 ENCOUNTER — Other Ambulatory Visit (HOSPITAL_COMMUNITY): Payer: Self-pay | Admitting: Internal Medicine

## 2018-12-04 DIAGNOSIS — Z1231 Encounter for screening mammogram for malignant neoplasm of breast: Secondary | ICD-10-CM

## 2018-12-05 ENCOUNTER — Ambulatory Visit: Payer: Medicare Other | Admitting: Orthopaedic Surgery

## 2018-12-12 ENCOUNTER — Encounter: Payer: Self-pay | Admitting: Orthopaedic Surgery

## 2018-12-12 ENCOUNTER — Ambulatory Visit (INDEPENDENT_AMBULATORY_CARE_PROVIDER_SITE_OTHER): Payer: Medicare Other | Admitting: Orthopaedic Surgery

## 2018-12-12 DIAGNOSIS — M25561 Pain in right knee: Secondary | ICD-10-CM | POA: Diagnosis not present

## 2018-12-12 DIAGNOSIS — R531 Weakness: Secondary | ICD-10-CM

## 2018-12-12 DIAGNOSIS — G8929 Other chronic pain: Secondary | ICD-10-CM

## 2018-12-12 MED ORDER — HYDROCODONE-ACETAMINOPHEN 5-325 MG PO TABS: ORAL_TABLET | ORAL | 0 refills | Status: DC

## 2018-12-12 NOTE — Progress Notes (Signed)
PROCEDURE NOTE:  The patient requests injections of the right knee , verbal consent was obtained.  The right knee was prepped appropriately after time out was performed.   Sterile technique was observed and injection of 1 cc of Depo-Medrol 40 mg with several cc's of plain xylocaine. Anesthesia was provided by ethyl chloride and a 20-gauge needle was used to inject the knee area. The injection was tolerated well.  A band aid dressing was applied.  The patient was advised to apply ice later today and tomorrow to the injection sight as needed.  I have reviewed the [place] web site prior to prescribing narcotic medicine for this patient.    Electronically Signed Sanjuana Kava, MD 2/25/20208:51 AM

## 2018-12-13 ENCOUNTER — Ambulatory Visit (HOSPITAL_COMMUNITY)
Admission: RE | Admit: 2018-12-13 | Discharge: 2018-12-13 | Disposition: A | Discharge status: Home / Self Care | Payer: Medicare Other | Source: Ambulatory Visit | Attending: Internal Medicine | Admitting: Internal Medicine

## 2018-12-13 DIAGNOSIS — Z1231 Encounter for screening mammogram for malignant neoplasm of breast: Secondary | ICD-10-CM | POA: Insufficient documentation

## 2019-01-11 ENCOUNTER — Other Ambulatory Visit: Payer: Self-pay | Admitting: Orthopaedic Surgery

## 2019-01-15 ENCOUNTER — Ambulatory Visit (HOSPITAL_COMMUNITY): Payer: Medicare Other

## 2019-01-18 ENCOUNTER — Telehealth: Payer: Self-pay | Admitting: Orthopaedic Surgery

## 2019-01-18 NOTE — Telephone Encounter (Signed)
Hydrocodone-Acetaminophen 5/325mg Qty 56 Tablets  PATIENT USES [facility] 

## 2019-01-22 ENCOUNTER — Observation Stay (HOSPITAL_COMMUNITY)
Admission: EM | Admit: 2019-01-22 | Discharge: 2019-01-24 | Disposition: A | Discharge status: Home / Self Care | Payer: Medicare Other | Attending: Internal Medicine | Admitting: Internal Medicine

## 2019-01-22 ENCOUNTER — Encounter (HOSPITAL_COMMUNITY): Payer: Self-pay | Admitting: Emergency Medicine

## 2019-01-22 ENCOUNTER — Emergency Department (HOSPITAL_COMMUNITY): Payer: Medicare Other

## 2019-01-22 ENCOUNTER — Other Ambulatory Visit: Payer: Self-pay

## 2019-01-22 DIAGNOSIS — J45909 Unspecified asthma, uncomplicated: Secondary | ICD-10-CM

## 2019-01-22 DIAGNOSIS — K219 Gastro-esophageal reflux disease without esophagitis: Secondary | ICD-10-CM | POA: Diagnosis present

## 2019-01-22 DIAGNOSIS — R197 Diarrhea, unspecified: Secondary | ICD-10-CM | POA: Diagnosis not present

## 2019-01-22 DIAGNOSIS — Z79899 Other long term (current) drug therapy: Secondary | ICD-10-CM | POA: Insufficient documentation

## 2019-01-22 DIAGNOSIS — Z8673 Personal history of transient ischemic attack (TIA), and cerebral infarction without residual deficits: Secondary | ICD-10-CM | POA: Diagnosis not present

## 2019-01-22 DIAGNOSIS — F1010 Alcohol abuse, uncomplicated: Secondary | ICD-10-CM | POA: Diagnosis not present

## 2019-01-22 DIAGNOSIS — Z87891 Personal history of nicotine dependence: Secondary | ICD-10-CM | POA: Insufficient documentation

## 2019-01-22 DIAGNOSIS — R112 Nausea with vomiting, unspecified: Secondary | ICD-10-CM | POA: Diagnosis present

## 2019-01-22 DIAGNOSIS — R1031 Right lower quadrant pain: Secondary | ICD-10-CM | POA: Insufficient documentation

## 2019-01-22 DIAGNOSIS — N179 Acute kidney failure, unspecified: Secondary | ICD-10-CM | POA: Diagnosis not present

## 2019-01-22 DIAGNOSIS — I959 Hypotension, unspecified: Secondary | ICD-10-CM | POA: Diagnosis not present

## 2019-01-22 DIAGNOSIS — E876 Hypokalemia: Secondary | ICD-10-CM | POA: Diagnosis not present

## 2019-01-22 DIAGNOSIS — G40909 Epilepsy, unspecified, not intractable, without status epilepticus: Secondary | ICD-10-CM | POA: Diagnosis not present

## 2019-01-22 DIAGNOSIS — I10 Essential (primary) hypertension: Secondary | ICD-10-CM | POA: Diagnosis not present

## 2019-01-22 DIAGNOSIS — K529 Noninfective gastroenteritis and colitis, unspecified: Secondary | ICD-10-CM

## 2019-01-22 LAB — URINALYSIS, ROUTINE W REFLEX MICROSCOPIC
Bilirubin Urine: NEGATIVE
Glucose, UA: NEGATIVE mg/dL
Hgb urine dipstick: NEGATIVE
Ketones, ur: NEGATIVE mg/dL
Leukocytes,Ua: NEGATIVE
Nitrite: NEGATIVE
Protein, ur: 30 mg/dL — AB
Specific Gravity, Urine: 1.015 (ref 1.005–1.030)
pH: 7 (ref 5.0–8.0)

## 2019-01-22 LAB — PHENYTOIN LEVEL, TOTAL: Phenytoin Lvl: <2.5 ug/mL — ABNORMAL LOW (ref 10.0–20.0)

## 2019-01-22 LAB — DIFFERENTIAL
Basophils Absolute: 0 10*3/uL (ref 0.0–0.1)
Basophils Relative: 1 %
Eosinophils Absolute: 0 10*3/uL (ref 0.0–0.5)
Eosinophils Relative: 0 %
Lymphocytes Relative: 14 %
Lymphs Abs: 0.7 10*3/uL (ref 0.7–4.0)
Monocytes Absolute: 0.8 10*3/uL (ref 0.1–1.0)
Monocytes Relative: 14 %
Neutro Abs: 3.9 10*3/uL (ref 1.7–7.7)
Neutrophils Relative %: 72 %

## 2019-01-22 LAB — COMPREHENSIVE METABOLIC PANEL
ALT: 35 U/L (ref 0–44)
AST: 68 U/L — ABNORMAL HIGH (ref 15–41)
Albumin: 3.7 g/dL (ref 3.5–5.0)
Alkaline Phosphatase: 101 U/L (ref 38–126)
Anion gap: 19 — ABNORMAL HIGH (ref 5–15)
BUN: 21 mg/dL (ref 8–23)
CO2: 41 mmol/L — ABNORMAL HIGH (ref 22–32)
Calcium: 8.6 mg/dL — ABNORMAL LOW (ref 8.9–10.3)
Chloride: 76 mmol/L — ABNORMAL LOW (ref 98–111)
Creatinine, Ser: 3.33 mg/dL — ABNORMAL HIGH (ref 0.44–1.00)
GFR calc Af Amer: 16 mL/min — ABNORMAL LOW (ref >60)
GFR calc non Af Amer: 14 mL/min — ABNORMAL LOW (ref >60)
Glucose, Bld: 129 mg/dL — ABNORMAL HIGH (ref 70–99)
Potassium: 2.5 mmol/L — CL (ref 3.5–5.1)
Sodium: 136 mmol/L (ref 135–145)
Total Bilirubin: 1.2 mg/dL (ref 0.3–1.2)
Total Protein: 7.5 g/dL (ref 6.5–8.1)

## 2019-01-22 LAB — CBC
HCT: 32 % — ABNORMAL LOW (ref 36.0–46.0)
Hemoglobin: 10.5 g/dL — ABNORMAL LOW (ref 12.0–15.0)
MCH: 30.3 pg (ref 26.0–34.0)
MCHC: 32.8 g/dL (ref 30.0–36.0)
MCV: 92.2 fL (ref 80.0–100.0)
Platelets: 147 10*3/uL — ABNORMAL LOW (ref 150–400)
RBC: 3.47 MIL/uL — ABNORMAL LOW (ref 3.87–5.11)
RDW: 15.5 % (ref 11.5–15.5)
WBC: 5.4 10*3/uL (ref 4.0–10.5)
nRBC: 0 % (ref 0.0–0.2)

## 2019-01-22 LAB — RAPID URINE DRUG SCREEN, HOSP PERFORMED
Amphetamines: NOT DETECTED
Barbiturates: NOT DETECTED
Benzodiazepines: NOT DETECTED
Cocaine: NOT DETECTED
Opiates: NOT DETECTED
Tetrahydrocannabinol: NOT DETECTED

## 2019-01-22 LAB — LIPASE, BLOOD: Lipase: 33 U/L (ref 11–51)

## 2019-01-22 LAB — LACTIC ACID, PLASMA
Lactic Acid, Venous: 2.4 mmol/L (ref 0.5–1.9)
Lactic Acid, Venous: 1.6 mmol/L (ref 0.5–1.9)

## 2019-01-22 LAB — ETHANOL: Alcohol, Ethyl (B): <10 mg/dL (ref <10)

## 2019-01-22 LAB — TROPONIN I: Troponin I: <0.03 ng/mL (ref <0.03)

## 2019-01-22 LAB — MAGNESIUM: Magnesium: 1.9 mg/dL (ref 1.7–2.4)

## 2019-01-22 MED ORDER — HEPARIN SODIUM (PORCINE) 5000 UNIT/ML IJ SOLN
5000.0000 [IU] | Freq: Three times a day (TID) | INTRAMUSCULAR | Status: DC
  Administered 2019-01-22 – 2019-01-24 (×5): 5000 [IU] via SUBCUTANEOUS
  Filled 2019-01-22 (×5): qty 1

## 2019-01-22 MED ORDER — PHENYTOIN SODIUM EXTENDED 100 MG PO CAPS
100.0000 mg | ORAL_CAPSULE | Freq: Three times a day (TID) | ORAL | Status: DC
  Administered 2019-01-22 – 2019-01-24 (×5): 100 mg via ORAL
  Filled 2019-01-22 (×5): qty 1

## 2019-01-22 MED ORDER — ENSURE ENLIVE PO LIQD
237.0000 mL | Freq: Three times a day (TID) | ORAL | Status: DC
  Administered 2019-01-22 – 2019-01-24 (×5): 237 mL via ORAL
  Filled 2019-01-22 (×3): qty 237

## 2019-01-22 MED ORDER — POTASSIUM CHLORIDE 10 MEQ/100ML IV SOLN
10.0000 meq | INTRAVENOUS | Status: AC
  Administered 2019-01-22 (×2): 10 meq via INTRAVENOUS
  Filled 2019-01-22 (×2): qty 100

## 2019-01-22 MED ORDER — ALBUTEROL SULFATE (2.5 MG/3ML) 0.083% IN NEBU: 3.0000 mL | INHALATION_SOLUTION | Freq: Four times a day (QID) | RESPIRATORY_TRACT | Status: DC | PRN

## 2019-01-22 MED ORDER — SODIUM CHLORIDE 0.9 % IV BOLUS
1000.0000 mL | Freq: Once | INTRAVENOUS | Status: AC
  Administered 2019-01-22: 1000 mL via INTRAVENOUS

## 2019-01-22 MED ORDER — SODIUM CHLORIDE 0.9% FLUSH
3.0000 mL | Freq: Two times a day (BID) | INTRAVENOUS | Status: DC
  Administered 2019-01-22 – 2019-01-24 (×3): 3 mL via INTRAVENOUS

## 2019-01-22 MED ORDER — PHENOBARBITAL 32.4 MG PO TABS
32.4000 mg | ORAL_TABLET | Freq: Two times a day (BID) | ORAL | Status: DC
  Administered 2019-01-22 – 2019-01-24 (×4): 32.4 mg via ORAL
  Filled 2019-01-22 (×4): qty 1

## 2019-01-22 MED ORDER — HYDROCODONE-ACETAMINOPHEN 5-325 MG PO TABS: ORAL_TABLET | ORAL | 0 refills | Status: DC

## 2019-01-22 MED ORDER — ONDANSETRON HCL 4 MG/2ML IJ SOLN: 4.0000 mg | Freq: Four times a day (QID) | INTRAMUSCULAR | Status: DC | PRN

## 2019-01-22 MED ORDER — FOLIC ACID 1 MG PO TABS
1.0000 mg | ORAL_TABLET | Freq: Every day | ORAL | Status: DC
  Administered 2019-01-22 – 2019-01-24 (×3): 1 mg via ORAL
  Filled 2019-01-22 (×3): qty 1

## 2019-01-22 MED ORDER — SODIUM CHLORIDE 0.9 % IV SOLN
INTRAVENOUS | Status: DC
  Administered 2019-01-22: 16:00:00 via INTRAVENOUS

## 2019-01-22 MED ORDER — POTASSIUM CHLORIDE 10 MEQ/100ML IV SOLN
10.0000 meq | INTRAVENOUS | Status: AC
  Administered 2019-01-22 – 2019-01-23 (×4): 10 meq via INTRAVENOUS
  Filled 2019-01-22 (×3): qty 100

## 2019-01-22 MED ORDER — POTASSIUM CHLORIDE IN NACL 20-0.9 MEQ/L-% IV SOLN
INTRAVENOUS | Status: AC
  Administered 2019-01-22: 21:00:00 via INTRAVENOUS

## 2019-01-22 MED ORDER — PANTOPRAZOLE SODIUM 40 MG PO TBEC
40.0000 mg | DELAYED_RELEASE_TABLET | Freq: Every morning | ORAL | Status: DC
  Administered 2019-01-23 – 2019-01-24 (×2): 40 mg via ORAL
  Filled 2019-01-22 (×2): qty 1

## 2019-01-22 MED ORDER — IOHEXOL 300 MG/ML  SOLN: 100.0000 mL | Freq: Once | INTRAMUSCULAR | Status: DC | PRN

## 2019-01-22 MED ORDER — ONDANSETRON HCL 4 MG/2ML IJ SOLN
4.0000 mg | INTRAMUSCULAR | Status: DC | PRN
  Administered 2019-01-22: 4 mg via INTRAVENOUS
  Filled 2019-01-22: qty 2

## 2019-01-22 NOTE — H&P (Signed)
History and Physical    [name] GYJ:[id number] DOB: 1956-02-24 DOA: 01/22/2019  PCP: [name], MD  Patient coming from: Home  I have personally briefly reviewed patient's old medical records in [place]  Chief Complaint: Frequent nausea and vomiting  HPI: [name] is a 63 y.o. 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  She has been unable to keep foods, liquids, or her home medications down.  She reports associated right-sided abdominal pain.  She feels that she has decreased urine output without dysuria.  She reports subjective fevers and chills.  She denies any diarrhea.  She denies any associated chest pain, dyspnea.  She denies any unusual changes in her diet.  She denies any sick contacts.  ED Course:  Initial vitals showed BP 120/57, pulse 95, RR 16, temp 97.7 Fahrenheit, SPO2 97% on room air.  Labs are notable for potassium 2.5, magnesium 1.9, sodium 136, chloride 76, bicarb 41, BUN 21, creatinine 3.33 (has normal renal function at baseline), serum glucose 129, AST 68, ALT 35, alk phos 101, T bili 1.2, WBC 5.4, hemoglobin 10.5, platelets 147, lactic acid 2.4 >> 1.6, troponin I <0.03.  Phenytoin level <2.5.  Urinalysis negative for UTI or ketones.  UDS and ethanol levels negative.  CT abdomen/pelvis without contrast was without acute findings.  Appendix appeared normal.  Diverticulosis without diverticulitis was noted.  Patient was given 1 L normal saline, and IV K 10 mEq x 2 runs.  The hospitalist service was consulted to admit for further management of acute kidney injury and hypokalemia.  Review of Systems: As per HPI otherwise 10 point review of systems negative.    Past Medical History:  Diagnosis Date   . Alcohol abuse   . Asthma   . Contracture of muscle of hand    right  . GERD (gastroesophageal reflux disease)   . HTN (hypertension)   . PUD (peptic ulcer disease)    remote  . Seizures ([place])    since stroke, no recent seizures  . Stroke [facility])    age 89, required brain surgery    Past Surgical History:  Procedure Laterality Date  . ABDOMINAL HYSTERECTOMY  2004   complete  . BRAIN SURGERY     age 38, stroke  . COLONOSCOPY    . COLONOSCOPY WITH PROPOFOL  10/03/2012   Dr. [name]: moderate diverticulosis, small internal hemorrhoids, TUBULAR ADENOMA. Next colonoscopy 2023 per Dr. [name].  . ESOPHAGOGASTRODUODENOSCOPY N/A 10/18/2014   Dr. Gala Romney during inpatient hospitalization: severe exudative/erosive reflux esophagitis as source of trivial upper GI bleed. No varices   . ESOPHAGOGASTRODUODENOSCOPY (EGD) WITH PROPOFOL  10/03/2012   Dr. [name]: stricture at [place] junction s/p dilation, mild gastritis negative H.pylori   . ESOPHAGOGASTRODUODENOSCOPY (EGD) WITH PROPOFOL N/A 12/27/2017   Procedure: ESOPHAGOGASTRODUODENOSCOPY (EGD) WITH PROPOFOL;  Surgeon: Danie Binder, MD;  Location: [facility] ENDO SUITE;  Service: Endoscopy;  Laterality: N/A;  10:15am  . right tib-fib fracture  2008  . SAVORY DILATION  10/03/2012   Procedure: SAVORY DILATION;  Surgeon: Danie Binder, MD;  Location: [facility] ORS;  Service: Endoscopy;  Laterality: N/A;  started at 1303,  dilated 12.8-16mm     reports that she quit smoking about 4 years ago. Her smoking use included cigarettes. She  has a 0.36 pack-year smoking history. She has never used smokeless tobacco. She reports current alcohol use of about 35.0 standard drinks of alcohol per week. She reports that she does not use drugs.  No Known Allergies  Family History  Problem Relation Age of Onset  . Liver disease Sister        etoh  . Colon cancer Neg Hx      Prior to Admission medications   Medication Sig Start Date End Date Taking? Authorizing Provider   albuterol (PROVENTIL HFA;VENTOLIN HFA) 108 (90 BASE) MCG/ACT inhaler Inhale 2 puffs into the lungs every 6 (six) hours as needed for wheezing or shortness of breath.    Yes [provider]  HYDROcodone-acetaminophen (NORCO/VICODIN) 5-325 MG tablet One tablet by mouth every six hours as needed for pain. Patient taking differently: Take 1 tablet by mouth every 6 (six) hours as needed for moderate pain or severe pain.  12/12/18  Yes Sanjuana Kava, MD  linaclotide [facility]) 145 MCG CAPS capsule 1 PO WITH your first meal Patient taking differently: Take 145 mcg by mouth daily before breakfast. your first meal 06/23/18  Yes Fields, Sandi L, MD  lisinopril (PRINIVIL,ZESTRIL) 40 MG tablet Take 40 mg by mouth daily.  10/06/17  Yes [provider]  naproxen (NAPROSYN) 500 MG tablet TAKE (1) TABLET BY MOUTH TWICE DAILY WITH A MEAL. Patient taking differently: Take 500 mg by mouth 2 (two) times daily with a meal.  01/15/19  Yes Sanjuana Kava, MD  pantoprazole (PROTONIX) 40 MG tablet 1 PO 30 MINS PRIOR TO FIRST MEAL Patient taking differently: Take 40 mg by mouth every morning. [place] 06/23/18  Yes Fields, Marga Melnick, MD  phenytoin (DILANTIN) 100 MG ER capsule Take 100 mg by mouth 3 (three) times daily.   Yes [provider]  solifenacin (VESICARE) 5 MG tablet Take 5 mg by mouth daily.   Yes [provider]  ENSURE PLUS (ENSURE PLUS) LIQD Take 237 mLs by mouth 3 (three) times daily between meals.    [provider]  folic acid (FOLVITE) 1 MG tablet Take 1 tablet (1 mg total) by mouth daily. 08/02/16   [name], MD  ipratropium-albuterol (DUONEB) 0.5-2.5 (3) MG/3ML SOLN Take 3 mLs by nebulization every 6 (six) hours as needed (for shortness of breath/wheezing).     [provider]  PHENObarbital (LUMINAL) 30 MG tablet Take 30 mg by mouth 2 (two) times daily.    [provider]  potassium chloride 20 MEQ TBCR Take 40 mEq by mouth 2  (two) times daily for 3 days. 12/20/17 12/23/17  Fields, Marga Melnick, MD  potassium chloride SA (K-DUR,KLOR-CON) 20 MEQ tablet Take 20 mEq by mouth 2 (two) times daily.  10/06/17   [provider]    Physical Exam: Vitals:   01/22/19 1915 01/22/19 1930 01/22/19 2014 01/22/19 2027  BP: 139/75 121/84  136/67  Pulse: 86 95  86  Resp: 18 (!) 21  20  Temp:    99.6 F (37.6 C)  TempSrc:    Oral  SpO2: 97% 96%  96%  Weight:   71.8 kg   Height:   5' 4"  (1.626 m)     Constitutional: NAD, calm, resting supine in bed comfortably Eyes: PERRL, lids and conjunctivae normal ENMT: Mucous membranes are dry. Posterior pharynx clear of any exudate or lesions.Normal dentition.  Neck: normal, supple, no masses. Respiratory: clear to auscultation bilaterally, no wheezing, no crackles. Normal respiratory effort.  No accessory muscle use.  Cardiovascular: Regular rate and rhythm, no murmurs / rubs / gallops. No extremity edema.  Abdomen: Mild generalized tenderness, no masses palpated. No hepatosplenomegaly. Bowel sounds positive.  Musculoskeletal: no clubbing / cyanosis.  Contracture of right wrist/hand. Normal muscle tone.  Skin: no rashes, lesions, ulcers. No induration Neurologic: CN 2-12 grossly intact. Sensation intact, Strength 5/5 in all 4.  Psychiatric: Normal judgment and insight. Alert and oriented x 3. Normal mood.   Labs on Admission: I have personally reviewed following labs and imaging studies  CBC: Recent Labs  Lab 01/22/19 1520  WBC 5.4  NEUTROABS 3.9  HGB 10.5*  HCT 32.0*  MCV 92.2  PLT 578*   Basic Metabolic Panel: Recent Labs  Lab 01/22/19 1520  NA 136  K 2.5*  CL 76*  CO2 41*  GLUCOSE 129*  BUN 21  CREATININE 3.33*  CALCIUM 8.6*  MG 1.9   GFR: Estimated Creatinine Clearance: 17.2 mL/min (A) (by C-G formula based on SCr of 3.33 mg/dL (H)). Liver Function Tests: Recent Labs  Lab 01/22/19 1520  AST 68*  ALT 35  ALKPHOS 101  BILITOT 1.2  PROT 7.5  ALBUMIN  3.7   Recent Labs  Lab 01/22/19 1520  LIPASE 33   No results for input(s): AMMONIA in the last 168 hours. Coagulation Profile: No results for input(s): INR, PROTIME in the last 168 hours. Cardiac Enzymes: Recent Labs  Lab 01/22/19 1520  TROPONINI <0.03   BNP (last 3 results) No results for input(s): PROBNP in the last 8760 hours. HbA1C: No results for input(s): HGBA1C in the last 72 hours. CBG: No results for input(s): GLUCAP in the last 168 hours. Lipid Profile: No results for input(s): CHOL, HDL, LDLCALC, TRIG, CHOLHDL, LDLDIRECT in the last 72 hours. Thyroid Function Tests: No results for input(s): TSH, T4TOTAL, FREET4, T3FREE, THYROIDAB in the last 72 hours. Anemia Panel: No results for input(s): VITAMINB12, FOLATE, FERRITIN, TIBC, IRON, RETICCTPCT in the last 72 hours. Urine analysis:    Component Value Date/Time   COLORURINE AMBER (A) 01/22/2019 1540   APPEARANCEUR HAZY (A) 01/22/2019 1540   LABSPEC 1.015 01/22/2019 1540   PHURINE 7.0 01/22/2019 1540   GLUCOSEU NEGATIVE 01/22/2019 1540   HGBUR NEGATIVE 01/22/2019 1540   BILIRUBINUR NEGATIVE 01/22/2019 1540   KETONESUR NEGATIVE 01/22/2019 1540   PROTEINUR 30 (A) 01/22/2019 1540   UROBILINOGEN 0.2 12/26/2014 1927   NITRITE NEGATIVE 01/22/2019 1540   LEUKOCYTESUR NEGATIVE 01/22/2019 1540    Radiological Exams on Admission: Ct Abdomen Pelvis Wo Contrast  Result Date: 01/22/2019 CLINICAL DATA:  Right lower quadrant pain with nausea and vomiting over the last 2 days. EXAM: CT ABDOMEN AND PELVIS WITHOUT CONTRAST TECHNIQUE: Multidetector CT imaging of the abdomen and pelvis was performed following the standard protocol without IV contrast. COMPARISON:  03/02/2017 FINDINGS: Lower chest: Small hiatal hernia.  Lung bases are clear. Hepatobiliary: Normal without contrast. Pancreas: Normal Spleen: Normal Adrenals/Urinary Tract: Adrenal glands are normal. Kidneys are normal. No cyst, mass, stone or hydronephrosis. Bladder is  normal. Stomach/Bowel: Appendix is normal. No other bowel finding of note. No sign of ileus or obstruction. Sigmoid diverticulosis without evidence of diverticulitis. Vascular/Lymphatic: Aortic atherosclerosis. No aneurysm. IVC is normal. No retroperitoneal adenopathy. Reproductive: Previous hysterectomy.  No pelvic mass. Other: No free fluid or air. Musculoskeletal: Ordinary degenerative changes affect the spine. IMPRESSION: No acute finding to explain the clinical presentation. Specifically, the appendix as well seen as a normal structure. No other acute bowel finding. Diverticulosis without  evidence of diverticulitis. Small hiatal hernia. Electronically Signed   By: Nelson Chimes M.D.   On: 01/22/2019 17:33   Dg Chest 1 View  Result Date: 01/22/2019 CLINICAL DATA:  Nausea and vomiting.  Chills. EXAM: CHEST  1 VIEW COMPARISON:  July 30, 2016 FINDINGS: No edema or consolidation. Heart size and pulmonary vascularity are normal. No adenopathy. There is arthropathy in each shoulder. IMPRESSION: No edema or consolidation. Electronically Signed   By: Lowella Grip III M.D.   On: 01/22/2019 16:25    EKG: Independently reviewed.  Sinus rhythm, early R wave transition, QTC 509.  Assessment/Plan Principal Problem:   Acute kidney injury ([place]) Active Problems:   Alcohol abuse   GERD (gastroesophageal reflux disease)   Hypokalemia   Seizure disorder ([place])   Asthma   Gastroenteritis  [name] is a 63 y.o. female with medical history significant for history of CVA age 16 resulting in seizure disorder, alcohol use disorder, liver cirrhosis, hypertension, and PUD who is admitted with gastroenteritis resulting in acute kidney injury and hypokalemia.  Acute kidney injury: Prerenal secondary to hypovolemia from frequent nausea and vomiting likely due to gastroenteritis. -Continue IV fluids overnight -Strict I/O's, monitor UOP -Hold lisinopril, avoid nephrotoxic agents/NSAIDs  Hypokalemia:  Secondary to GI losses. -Give additional IV supplementation -Repeat labs in a.m.  Hypertension: -Hold lisinopril with AKI  Seizure disorder: Phenytoin level undetectable on admission, possibly due to inability to keep her meds down.  She denies any recent seizure activity. -Continue home phenytoin and phenobarbital  Asthma: Currently stable. -Albuterol as needed  PUD: -Continue Protonix  Alcohol use: She reports last alcohol use about 1 week prior to admission.  She denies any history of withdrawal. -CIWA checks   DVT prophylaxis: subq heparin Code Status: Full code, confirmed with patient Family Communication: None present on admission Disposition Plan: Likely discharge to home pending improvement in electrolyte and renal abnormalities Consults called: None Admission status: Observation   Zada Finders MD [facility] Pager [phone]  If 7PM-7AM, please contact night-coverage [email or web address]  01/22/2019, 8:50 PM

## 2019-01-22 NOTE — ED Notes (Signed)
Date and time results received: 01/22/19 1620   Test: Potassium Critical Value: 2.5  Name of Provider Notified: Dr. Thurnell Garbe  Orders Received? Or Actions Taken?: see orders

## 2019-01-22 NOTE — ED Triage Notes (Signed)
RLQ pain, n/v/chills. Arrived alert. Sx x 2 days. Last vomited last night per ems. Pt from home with others

## 2019-01-22 NOTE — ED Provider Notes (Signed)
[facility] EMERGENCY DEPARTMENT Provider Note   CSN: [id number] Arrival date & time: 01/22/19  1419    History   Chief Complaint Chief Complaint  Patient presents with  . Abdominal Pain    HPI [name] is a 63 y.o. female.     HPI  Pt was seen at 1430. Per pt, c/o gradual onset and persistence of multiple intermittent episodes of N/V/D that began 2 to 3 days ago. Describes the stools as "watery." Has been associated with RLQ abd "pains." Denies CP/SOB, no back pain, no fevers, no black or blood in stools or emesis.    Past Medical History:  Diagnosis Date  . Alcohol abuse   . Asthma   . Contracture of muscle of hand    right  . GERD (gastroesophageal reflux disease)   . HTN (hypertension)   . PUD (peptic ulcer disease)    remote  . Seizures ([place])    since stroke, no recent seizures  . Stroke [facility])    age 41, required brain surgery    Patient Active Problem List   Diagnosis Date Noted  . Stroke ([place]) 06/14/2018  . Dyspepsia   . Cirrhosis of liver without ascites ([place]) 04/21/2017  . AKI (acute kidney injury) ([place]) 07/30/2016  . Anemia 11/25/2015  . Reflux esophagitis   . Sinus tachycardia ([place]) 10/10/2014  . Seizure disorder ([place]) 10/10/2014  . Alcohol abuse 09/05/2012  . GERD (gastroesophageal reflux disease) 09/05/2012  . Upper abdominal pain 09/05/2012  . FX CLOSED TIBIA NOS 07/27/2007  . STROKE-With also h/o Aneurysm clipping? 07/25/2007  . SEIZURES 07/25/2007    Past Surgical History:  Procedure Laterality Date  . ABDOMINAL HYSTERECTOMY  2004   complete  . BRAIN SURGERY     age 28, stroke  . COLONOSCOPY    . COLONOSCOPY WITH PROPOFOL  10/03/2012   Dr. [name]: moderate diverticulosis, small internal hemorrhoids, TUBULAR ADENOMA. Next colonoscopy 2023 per Dr. [name].  . ESOPHAGOGASTRODUODENOSCOPY N/A 10/18/2014   Dr. Gala Romney during inpatient hospitalization: severe exudative/erosive reflux esophagitis as source of trivial upper GI bleed. No  varices   . ESOPHAGOGASTRODUODENOSCOPY (EGD) WITH PROPOFOL  10/03/2012   Dr. [name]: stricture at Mammoth junction s/p dilation, mild gastritis negative H.pylori   . ESOPHAGOGASTRODUODENOSCOPY (EGD) WITH PROPOFOL N/A 12/27/2017   Procedure: ESOPHAGOGASTRODUODENOSCOPY (EGD) WITH PROPOFOL;  Surgeon: Danie Binder, MD;  Location: [facility] ENDO SUITE;  Service: Endoscopy;  Laterality: N/A;  10:15am  . right tib-fib fracture  2008  . SAVORY DILATION  10/03/2012   Procedure: SAVORY DILATION;  Surgeon: Danie Binder, MD;  Location: [facility] ORS;  Service: Endoscopy;  Laterality: N/A;  started at 1303,  dilated 12.8-16mm     OB History    Gravida  4   Para  3   Term  3   Preterm      AB  1   Living  3     SAB  1   TAB      Ectopic      Multiple      Live Births               Home Medications    Prior to Admission medications   Medication Sig Start Date End Date Taking? Authorizing Provider  albuterol (PROVENTIL HFA;VENTOLIN HFA) 108 (90 BASE) MCG/ACT inhaler Inhale 2 puffs into the lungs every 6 (six) hours as needed for wheezing or shortness of breath.     [provider]  ENSURE PLUS (ENSURE  PLUS) LIQD Take 237 mLs by mouth 3 (three) times daily between meals.    [provider]  folic acid (FOLVITE) 1 MG tablet Take 1 tablet (1 mg total) by mouth daily. 08/02/16   [name], MD  HYDROcodone-acetaminophen (NORCO/VICODIN) 5-325 MG tablet One tablet by mouth every six hours as needed for pain. 12/12/18   Sanjuana Kava, MD  ipratropium-albuterol (DUONEB) 0.5-2.5 (3) MG/3ML SOLN Take 3 mLs by nebulization every 6 (six) hours as needed (for shortness of breath/wheezing).     [provider]  linaclotide Rolan Lipa) 145 MCG CAPS capsule 1 PO WITH your first meal 06/23/18   Fields, Sandi L, MD  lisinopril (PRINIVIL,ZESTRIL) 40 MG tablet Take 40 mg by mouth daily.  10/06/17   [provider]  naproxen (NAPROSYN) 500 MG tablet TAKE (1) TABLET BY MOUTH TWICE  DAILY WITH A MEAL. 01/15/19   Sanjuana Kava, MD  pantoprazole (PROTONIX) 40 MG tablet 1 PO 30 MINS PRIOR TO FIRST MEAL 06/23/18   Fields, Marga Melnick, MD  PHENObarbital (LUMINAL) 30 MG tablet Take 30 mg by mouth 2 (two) times daily.    [provider]  phenytoin (DILANTIN) 100 MG ER capsule Take 100 mg by mouth 3 (three) times daily.    [provider]  potassium chloride 20 MEQ TBCR Take 40 mEq by mouth 2 (two) times daily for 3 days. 12/20/17 12/23/17  Fields, Marga Melnick, MD  potassium chloride SA (K-DUR,KLOR-CON) 20 MEQ tablet Take 20 mEq by mouth 2 (two) times daily.  10/06/17   [provider]  solifenacin (VESICARE) 5 MG tablet Take 5 mg by mouth daily.    [provider]  thiamine 100 MG tablet Take 1 tablet (100 mg total) by mouth daily. Patient not taking: Reported on 06/21/2018 08/02/16   [name], MD    Family History Family History  Problem Relation Age of Onset  . Liver disease Sister        etoh  . Colon cancer Neg Hx     Social History Social History   Tobacco Use  . Smoking status: Former Smoker    Packs/day: 0.04    Years: 9.00    Pack years: 0.36    Types: Cigarettes    Last attempt to quit: 07/28/2014    Years since quitting: 4.4  . Smokeless tobacco: Never Used  Substance Use Topics  . Alcohol use: Yes    Alcohol/week: 35.0 standard drinks    Types: 35 Cans of beer per week    Comment: last use 2 weeks ago  . Drug use: No     Allergies   Patient has no known allergies.   Review of Systems Review of Systems ROS: Statement: All systems negative except as marked or noted in the HPI; Constitutional: Negative for fever and chills. ; ; Eyes: Negative for eye pain, redness and discharge. ; ; ENMT: Negative for ear pain, hoarseness, nasal congestion, sinus pressure and sore throat. ; ; Cardiovascular: Negative for chest pain, palpitations, diaphoresis, dyspnea and peripheral edema. ; ; Respiratory: Negative for cough, wheezing and  stridor. ; ; Gastrointestinal: +N/V/D, abd pain. Negative for blood in stool, hematemesis, jaundice and rectal bleeding. . ; ; Genitourinary: Negative for dysuria, flank pain and hematuria. ; ; Musculoskeletal: Negative for back pain and neck pain. Negative for swelling and trauma.; ; Skin: Negative for pruritus, rash, abrasions, blisters, bruising and skin lesion.; ; Neuro: Negative for headache, lightheadedness and neck stiffness. Negative for weakness, altered level of  consciousness, altered mental status, extremity weakness, paresthesias, involuntary movement, seizure and syncope.       Physical Exam Updated Vital Signs BP 133/66   Pulse 90   Temp 97.7 F (36.5 C) (Oral)   Resp 13   SpO2 97%    Patient Vitals for the past 24 hrs:  BP Temp Temp src Pulse Resp SpO2  01/22/19 1800 132/71 - - 91 17 97 %  01/22/19 1730 138/72 - - 90 18 95 %  01/22/19 1717 134/71 - - 96 19 96 %  01/22/19 1615 129/78 - - 92 16 96 %  01/22/19 1600 125/79 - - 93 15 96 %  01/22/19 1545 115/71 - - 92 12 96 %  01/22/19 1530 132/67 - - 92 17 95 %  01/22/19 1520 133/66 - - 90 13 97 %  01/22/19 1500 (!) 120/57 - - 95 16 97 %  01/22/19 1445 (!) 103/51 - - 90 17 90 %  01/22/19 1430 (!) 82/51 - - (!) 101 19 94 %  01/22/19 1418 (!) 87/47 97.7 F (36.5 C) Oral 90 18 96 %     Physical Exam 1435: Physical examination:  Nursing notes reviewed; Vital signs and O2 SAT reviewed;  Constitutional: Well developed, Well nourished, In no acute distress; Head:  Normocephalic, atraumatic; Eyes: EOMI, PERRL, No scleral icterus; ENMT: Mouth and pharynx normal, Mucous membranes dry; Neck: Supple, Full range of motion, No lymphadenopathy; Cardiovascular: Regular rate and rhythm, No gallop; Respiratory: Breath sounds clear & equal bilaterally, No wheezes.  Speaking full sentences with ease, Normal respiratory effort/excursion; Chest: Nontender, Movement normal; Abdomen: +vomiting during my exam. Soft, +RLQ tenderness to palp. No  rebound or guarding. Nondistended, Normal bowel sounds; Genitourinary: No CVA tenderness; Extremities: Peripheral pulses normal, No tenderness, No edema, No calf edema or asymmetry.; Neuro: AA&Ox3, Major CN grossly intact.  Speech clear. +right sided weakness with RUE contraction per hx, otherwise no new gross focal motor deficits in extremities.; Skin: Color normal, Warm, Dry.    ED Treatments / Results  Labs (all labs ordered are listed, but only abnormal results are displayed)   EKG EKG Interpretation  Date/Time:  Monday January 22 2019 14:20:36 EDT Ventricular Rate:  91 PR Interval:    QRS Duration: 100 QT Interval:  413 QTC Calculation: 509 R Axis:   31 Text Interpretation:  Sinus rhythm Abnormal R-wave progression, early transition Prolonged QT interval When compared with ECG of 12/20/2017 QT has lengthened Otherwise no significant change Confirmed by Francine Graven [phone]) on 01/22/2019 3:02:29 PM   Radiology   Procedures Procedures (including critical care time)  Medications Ordered in ED Medications  ondansetron (ZOFRAN) injection 4 mg (4 mg Intravenous Given 01/22/19 1437)  iohexol (OMNIPAQUE) 300 MG/ML solution 100 mL (has no administration in time range)  sodium chloride 0.9 % bolus 1,000 mL (1,000 mLs Intravenous New Bag/Given 01/22/19 1425)     Initial Impression / Assessment and Plan / ED Course  I have reviewed the triage vital signs and the nursing notes.  Pertinent labs & imaging results that were available during my care of the patient were reviewed by me and considered in my medical decision making (see chart for details).     MDM Reviewed: previous chart, nursing note and vitals Reviewed previous: labs and ECG Interpretation: labs, ECG, CT scan and x-ray Total time providing critical care: 30-74 minutes. This excludes time spent performing separately reportable procedures and services. Consults: admitting MD   CRITICAL CARE Performed by: Nunzio Cory  Belina Mandile Total critical care time: 45 minutes Critical care time was exclusive of separately billable procedures and treating other patients. Critical care was necessary to treat or prevent imminent or life-threatening deterioration. Critical care was time spent personally by me on the following activities: development of treatment plan with patient and/or surrogate as well as nursing, discussions with consultants, evaluation of patient's response to treatment, examination of patient, obtaining history from patient or surrogate, ordering and performing treatments and interventions, ordering and review of laboratory studies, ordering and review of radiographic studies, pulse oximetry and re-evaluation of patient's condition.   Results for orders placed or performed during the hospital encounter of 01/22/19  Lipase, blood  Result Value Ref Range   Lipase 33 11 - 51 U/L  Comprehensive metabolic panel  Result Value Ref Range   Sodium 136 135 - 145 mmol/L   Potassium 2.5 (LL) 3.5 - 5.1 mmol/L   Chloride 76 (L) 98 - 111 mmol/L   CO2 41 (H) 22 - 32 mmol/L   Glucose, Bld 129 (H) 70 - 99 mg/dL   BUN 21 8 - 23 mg/dL   Creatinine, Ser 3.33 (H) 0.44 - 1.00 mg/dL   Calcium 8.6 (L) 8.9 - 10.3 mg/dL   Total Protein 7.5 6.5 - 8.1 g/dL   Albumin 3.7 3.5 - 5.0 g/dL   AST 68 (H) 15 - 41 U/L   ALT 35 0 - 44 U/L   Alkaline Phosphatase 101 38 - 126 U/L   Total Bilirubin 1.2 0.3 - 1.2 mg/dL   GFR calc non Af Amer 14 (L) >60 mL/min   GFR calc Af Amer 16 (L) >60 mL/min   Anion gap 19 (H) 5 - 15  CBC  Result Value Ref Range   WBC 5.4 4.0 - 10.5 K/uL   RBC 3.47 (L) 3.87 - 5.11 MIL/uL   Hemoglobin 10.5 (L) 12.0 - 15.0 g/dL   HCT 32.0 (L) 36.0 - 46.0 %   MCV 92.2 80.0 - 100.0 fL   MCH 30.3 26.0 - 34.0 pg   MCHC 32.8 30.0 - 36.0 g/dL   RDW 15.5 11.5 - 15.5 %   Platelets 147 (L) 150 - 400 K/uL   nRBC 0.0 0.0 - 0.2 %  Urinalysis, Routine w reflex microscopic  Result Value Ref Range   Color, Urine AMBER  (A) YELLOW   APPearance HAZY (A) CLEAR   Specific Gravity, Urine 1.015 1.005 - 1.030   pH 7.0 5.0 - 8.0   Glucose, UA NEGATIVE NEGATIVE mg/dL   Hgb urine dipstick NEGATIVE NEGATIVE   Bilirubin Urine NEGATIVE NEGATIVE   Ketones, ur NEGATIVE NEGATIVE mg/dL   Protein, ur 30 (A) NEGATIVE mg/dL   Nitrite NEGATIVE NEGATIVE   Leukocytes,Ua NEGATIVE NEGATIVE   RBC / HPF 0-5 0 - 5 RBC/hpf   WBC, UA 0-5 0 - 5 WBC/hpf   Bacteria, UA RARE (A) NONE SEEN   Squamous Epithelial / LPF 0-5 0 - 5   Mucus PRESENT    Hyaline Casts, UA PRESENT    Amorphous Crystal PRESENT   Lactic acid, plasma  Result Value Ref Range   Lactic Acid, Venous 2.4 (HH) 0.5 - 1.9 mmol/L  Lactic acid, plasma  Result Value Ref Range   Lactic Acid, Venous 1.6 0.5 - 1.9 mmol/L  Ethanol  Result Value Ref Range   Alcohol, Ethyl (B) <10 <10 mg/dL  Phenytoin level, total  Result Value Ref Range   Phenytoin Lvl <2.5 (L) 10.0 - 20.0 ug/mL  Differential  Result Value Ref Range   Neutrophils Relative % 72 %   Neutro Abs 3.9 1.7 - 7.7 K/uL   Lymphocytes Relative 14 %   Lymphs Abs 0.7 0.7 - 4.0 K/uL   Monocytes Relative 14 %   Monocytes Absolute 0.8 0.1 - 1.0 K/uL   Eosinophils Relative 0 %   Eosinophils Absolute 0.0 0.0 - 0.5 K/uL   Basophils Relative 1 %   Basophils Absolute 0.0 0.0 - 0.1 K/uL  Troponin I -  Result Value Ref Range   Troponin I <0.03 <0.03 ng/mL  Magnesium  Result Value Ref Range   Magnesium 1.9 1.7 - 2.4 mg/dL   Ct Abdomen Pelvis Wo Contrast Result Date: 01/22/2019 CLINICAL DATA:  Right lower quadrant pain with nausea and vomiting over the last 2 days. EXAM: CT ABDOMEN AND PELVIS WITHOUT CONTRAST TECHNIQUE: Multidetector CT imaging of the abdomen and pelvis was performed following the standard protocol without IV contrast. COMPARISON:  03/02/2017 FINDINGS: Lower chest: Small hiatal hernia.  Lung bases are clear. Hepatobiliary: Normal without contrast. Pancreas: Normal Spleen: Normal Adrenals/Urinary Tract:  Adrenal glands are normal. Kidneys are normal. No cyst, mass, stone or hydronephrosis. Bladder is normal. Stomach/Bowel: Appendix is normal. No other bowel finding of note. No sign of ileus or obstruction. Sigmoid diverticulosis without evidence of diverticulitis. Vascular/Lymphatic: Aortic atherosclerosis. No aneurysm. IVC is normal. No retroperitoneal adenopathy. Reproductive: Previous hysterectomy.  No pelvic mass. Other: No free fluid or air. Musculoskeletal: Ordinary degenerative changes affect the spine. IMPRESSION: No acute finding to explain the clinical presentation. Specifically, the appendix as well seen as a normal structure. No other acute bowel finding. Diverticulosis without evidence of diverticulitis. Small hiatal hernia. Electronically Signed   By: Nelson Chimes M.D.   On: 01/22/2019 17:33   Dg Chest 1 View Result Date: 01/22/2019 CLINICAL DATA:  Nausea and vomiting.  Chills. EXAM: CHEST  1 VIEW COMPARISON:  July 30, 2016 FINDINGS: No edema or consolidation. Heart size and pulmonary vascularity are normal. No adenopathy. There is arthropathy in each shoulder. IMPRESSION: No edema or consolidation. Electronically Signed   By: Lowella Grip III M.D.   On: 01/22/2019 16:25    Results for ARDYCE, HEYER (MRN [id number]) as of 01/22/2019 18:32  Ref. Range 12/20/2017 10:02 07/03/2018 09:04 01/22/2019 15:20  BUN Latest Ref Range: 8 - 23 mg/dL 12 12 21   Creatinine Latest Ref Range: 0.44 - 1.00 mg/dL 0.63 1.12 (H) 3.33 (H)    Results for SHERLON, NIED (MRN [id number]) as of 01/22/2019 18:32  Ref. Range 10/16/2017 09:56 10/17/2017 01:53 12/20/2017 10:02 07/03/2018 09:04 01/22/2019 15:20  Hemoglobin Latest Ref Range: 12.0 - 15.0 g/dL 10.8 (L) 10.0 (L) 11.7 (L) 10.3 (L) 10.5 (L)  HCT Latest Ref Range: 36.0 - 46.0 % 32.9 (L) 29.8 (L) 36.2 31.4 (L) 32.0 (L)  Platelets Latest Ref Range: 150 - 400 K/uL   158 127 (L) 147 (L)     1830:  Pt hypotensive on arrival; IVF boluses given with slow  improvement. Initial lactic acid elevated, 2nd improved after IVF was given. New AKI and hypokalemia. IVF continues, IV potassium infusing. H/H/platelets per range in Epic. CT scan reassuring. Etoh level low; pt does not demonstrate s/s of withdrawal at this time. Dx and testing d/w pt and family.  Questions answered.  Verb understanding, agreeable to admit.  T/C returned from [facility] Dr. Posey Pronto, case discussed, including:  HPI, pertinent PM/SHx, VS/PE, dx testing, ED course and treatment:  Agreeable to admit.  Final Clinical Impressions(s) / ED Diagnoses   Final diagnoses:  None    ED Discharge Orders    None       Francine Graven, DO 01/25/19 0827

## 2019-01-22 NOTE — ED Notes (Signed)
Patient transported to CT 

## 2019-01-22 NOTE — ED Notes (Signed)
CRITICAL VALUE ALERT  Critical Value:  Lactic 2.4  Date & Time Notied:  01/22/2019 1604  Provider Notified: Dr. Thurnell Garbe   Orders Received/Actions taken: None yet

## 2019-01-23 ENCOUNTER — Other Ambulatory Visit: Payer: Self-pay

## 2019-01-23 DIAGNOSIS — K529 Noninfective gastroenteritis and colitis, unspecified: Secondary | ICD-10-CM | POA: Diagnosis not present

## 2019-01-23 DIAGNOSIS — J45909 Unspecified asthma, uncomplicated: Secondary | ICD-10-CM

## 2019-01-23 DIAGNOSIS — R112 Nausea with vomiting, unspecified: Secondary | ICD-10-CM | POA: Diagnosis not present

## 2019-01-23 DIAGNOSIS — N179 Acute kidney failure, unspecified: Secondary | ICD-10-CM | POA: Diagnosis not present

## 2019-01-23 DIAGNOSIS — E876 Hypokalemia: Secondary | ICD-10-CM

## 2019-01-23 DIAGNOSIS — G40909 Epilepsy, unspecified, not intractable, without status epilepticus: Secondary | ICD-10-CM

## 2019-01-23 LAB — CBC
HCT: 31.5 % — ABNORMAL LOW (ref 36.0–46.0)
Hemoglobin: 10.3 g/dL — ABNORMAL LOW (ref 12.0–15.0)
MCH: 30.8 pg (ref 26.0–34.0)
MCHC: 32.7 g/dL (ref 30.0–36.0)
MCV: 94.3 fL (ref 80.0–100.0)
Platelets: 115 10*3/uL — ABNORMAL LOW (ref 150–400)
RBC: 3.34 MIL/uL — ABNORMAL LOW (ref 3.87–5.11)
RDW: 15.2 % (ref 11.5–15.5)
WBC: 4.5 10*3/uL (ref 4.0–10.5)
nRBC: 0 % (ref 0.0–0.2)

## 2019-01-23 LAB — BASIC METABOLIC PANEL
Anion gap: 13 (ref 5–15)
Anion gap: 11 (ref 5–15)
BUN: 19 mg/dL (ref 8–23)
BUN: 14 mg/dL (ref 8–23)
CO2: 35 mmol/L — ABNORMAL HIGH (ref 22–32)
CO2: 23 mmol/L (ref 22–32)
Calcium: 8.1 mg/dL — ABNORMAL LOW (ref 8.9–10.3)
Calcium: 8.4 mg/dL — ABNORMAL LOW (ref 8.9–10.3)
Chloride: 87 mmol/L — ABNORMAL LOW (ref 98–111)
Chloride: 102 mmol/L (ref 98–111)
Creatinine, Ser: 1.99 mg/dL — ABNORMAL HIGH (ref 0.44–1.00)
Creatinine, Ser: 1.45 mg/dL — ABNORMAL HIGH (ref 0.44–1.00)
GFR calc Af Amer: 31 mL/min — ABNORMAL LOW (ref >60)
GFR calc Af Amer: 45 mL/min — ABNORMAL LOW (ref >60)
GFR calc non Af Amer: 26 mL/min — ABNORMAL LOW (ref >60)
GFR calc non Af Amer: 39 mL/min — ABNORMAL LOW (ref >60)
Glucose, Bld: 120 mg/dL — ABNORMAL HIGH (ref 70–99)
Glucose, Bld: 90 mg/dL (ref 70–99)
Potassium: 2.8 mmol/L — ABNORMAL LOW (ref 3.5–5.1)
Potassium: 5.2 mmol/L — ABNORMAL HIGH (ref 3.5–5.1)
Sodium: 135 mmol/L (ref 135–145)
Sodium: 136 mmol/L (ref 135–145)

## 2019-01-23 LAB — MAGNESIUM: Magnesium: 1.8 mg/dL (ref 1.7–2.4)

## 2019-01-23 MED ORDER — POTASSIUM CHLORIDE IN NACL 20-0.9 MEQ/L-% IV SOLN: INTRAVENOUS | Status: DC

## 2019-01-23 MED ORDER — SODIUM CHLORIDE 0.9 % IV SOLN
INTRAVENOUS | Status: DC
  Administered 2019-01-23: 18:00:00 via INTRAVENOUS

## 2019-01-23 MED ORDER — POTASSIUM CHLORIDE 10 MEQ/100ML IV SOLN
10.0000 meq | INTRAVENOUS | Status: AC
  Administered 2019-01-23 (×4): 10 meq via INTRAVENOUS
  Filled 2019-01-23 (×4): qty 100

## 2019-01-23 MED ORDER — POTASSIUM CHLORIDE CRYS ER 20 MEQ PO TBCR
40.0000 meq | EXTENDED_RELEASE_TABLET | ORAL | Status: AC
  Administered 2019-01-23 (×2): 40 meq via ORAL
  Filled 2019-01-23 (×2): qty 2

## 2019-01-23 NOTE — Plan of Care (Signed)

## 2019-01-23 NOTE — Progress Notes (Signed)
PROGRESS NOTE    [name]  QBH:[id number] DOB: 1955-12-24 DOA: 01/22/2019 PCP: [name], MD    Brief Narrative:  63 year old female with history of prior CVAs, seizure disorder, admitted to the hospital with frequent nausea and vomiting due to gastroenteritis resulting in severe hypokalemia and acute kidney injury.  She is being admitted for further treatment   Assessment & Plan:   Principal Problem:   Acute kidney injury ([place]) Active Problems:   Alcohol abuse   GERD (gastroesophageal reflux disease)   Hypokalemia   Seizure disorder ([place])   Asthma   Gastroenteritis   1. Gastroenteritis.  Treating supportively.  Tolerating clear liquids.  Continue antiemetics.  Advance diet as tolerated. 2. Acute kidney injury.  Related to dehydration.  Improving with hydration.  Continue current treatments. 3. Hypokalemia.  Secondary to persistent vomiting.  Improving with replacement. 4. Seizure disorder.  Continue on Dilantin and phenobarbital.  No recent seizures. 5. Hypertension.  Holding ACE inhibitor in the setting of AKI 6. Asthma.  No wheezing or shortness of breath.  Continue bronchodilators as needed. 7. Generalized weakness.  Physical therapy evaluation   DVT prophylaxis: Heparin Code Status: Full Family Communication: No family present Disposition Plan: Discharge home after PT evaluation   Consultants:     Procedures:     Antimicrobials:       Subjective: No diarrhea.  No further vomiting.  Tolerating clear liquids.  Objective: Vitals:   01/22/19 2027 01/23/19 0000 01/23/19 0550 01/23/19 1305  BP: 136/67 137/67 121/64 125/67  Pulse: 86 86 93 87  Resp: 20  19 18   Temp: 99.6 F (37.6 C)  99 F (37.2 C) 98.2 F (36.8 C)  TempSrc: Oral  Oral   SpO2: 96%  92% 93%  Weight:   76.3 kg   Height:        Intake/Output Summary (Last 24 hours) at 01/23/2019 2015 Last data filed at 01/23/2019 1811 Gross per 24 hour  Intake 1000 ml  Output 1520 ml  Net  -520 ml   Filed Weights   01/22/19 2014 01/23/19 0550  Weight: 71.8 kg 76.3 kg    Examination:  General exam: Appears calm and comfortable  Respiratory system: Clear to auscultation. Respiratory effort normal. Cardiovascular system: S1 & S2 heard, RRR. No JVD, murmurs, rubs, gallops or clicks. No pedal edema. Gastrointestinal system: Abdomen is nondistended, soft and nontender. No organomegaly or masses felt. Normal bowel sounds heard. Central nervous system: Alert and oriented.  Chronic weakness in Right upper and lower extremities Extremities: Contractured right upper extremity Skin: No rashes, lesions or ulcers Psychiatry: Judgement and insight appear normal. Mood & affect appropriate.     Data Reviewed: I have personally reviewed following labs and imaging studies  CBC: Recent Labs  Lab 01/22/19 1520 01/23/19 0436  WBC 5.4 4.5  NEUTROABS 3.9  --   HGB 10.5* 10.3*  HCT 32.0* 31.5*  MCV 92.2 94.3  PLT 147* 097*   Basic Metabolic Panel: Recent Labs  Lab 01/22/19 1520 01/23/19 0436 01/23/19 1428  NA 136 135 136  K 2.5* 2.8* 5.2*  CL 76* 87* 102  CO2 41* 35* 23  GLUCOSE 129* 120* 90  BUN 21 19 14   CREATININE 3.33* 1.99* 1.45*  CALCIUM 8.6* 8.1* 8.4*  MG 1.9 1.8  --    GFR: Estimated Creatinine Clearance: 40.7 mL/min (A) (by C-G formula based on SCr of 1.45 mg/dL (H)). Liver Function Tests: Recent Labs  Lab 01/22/19 1520  AST 68*  ALT  35  ALKPHOS 101  BILITOT 1.2  PROT 7.5  ALBUMIN 3.7   Recent Labs  Lab 01/22/19 1520  LIPASE 33   No results for input(s): AMMONIA in the last 168 hours. Coagulation Profile: No results for input(s): INR, PROTIME in the last 168 hours. Cardiac Enzymes: Recent Labs  Lab 01/22/19 1520  TROPONINI <0.03   BNP (last 3 results) No results for input(s): PROBNP in the last 8760 hours. HbA1C: No results for input(s): HGBA1C in the last 72 hours. CBG: No results for input(s): GLUCAP in the last 168 hours. Lipid  Profile: No results for input(s): CHOL, HDL, LDLCALC, TRIG, CHOLHDL, LDLDIRECT in the last 72 hours. Thyroid Function Tests: No results for input(s): TSH, T4TOTAL, FREET4, T3FREE, THYROIDAB in the last 72 hours. Anemia Panel: No results for input(s): VITAMINB12, FOLATE, FERRITIN, TIBC, IRON, RETICCTPCT in the last 72 hours. Sepsis Labs: Recent Labs  Lab 01/22/19 1520 01/22/19 1626  LATICACIDVEN 2.4* 1.6    Recent Results (from the past 240 hour(s))  Urine culture     Status: None (Preliminary result)   Collection Time: 01/22/19  3:40 PM  Result Value Ref Range Status   Specimen Description   Final    URINE, CATHETERIZED Performed at [facility], [address] [place], [place] [address]    Special Requests   Final    NONE Performed at [facility], [address], [place], [place] [address]    Culture   Final    CULTURE REINCUBATED FOR BETTER GROWTH Performed at [facility], [place] [address] [place], [place] [address]    Report Status PENDING  Incomplete         Radiology Studies: Ct Abdomen Pelvis Wo Contrast  Result Date: 01/22/2019 CLINICAL DATA:  Right lower quadrant pain with nausea and vomiting over the last 2 days. EXAM: CT ABDOMEN AND PELVIS WITHOUT CONTRAST TECHNIQUE: Multidetector CT imaging of the abdomen and pelvis was performed following the standard protocol without IV contrast. COMPARISON:  03/02/2017 FINDINGS: Lower chest: Small hiatal hernia.  Lung bases are clear. Hepatobiliary: Normal without contrast. Pancreas: Normal Spleen: Normal Adrenals/Urinary Tract: Adrenal glands are normal. Kidneys are normal. No cyst, mass, stone or hydronephrosis. Bladder is normal. Stomach/Bowel: Appendix is normal. No other bowel finding of note. No sign of ileus or obstruction. Sigmoid diverticulosis without evidence of diverticulitis. Vascular/Lymphatic: Aortic atherosclerosis. No aneurysm. IVC is normal. No retroperitoneal adenopathy. Reproductive: Previous  hysterectomy.  No pelvic mass. Other: No free fluid or air. Musculoskeletal: Ordinary degenerative changes affect the spine. IMPRESSION: No acute finding to explain the clinical presentation. Specifically, the appendix as well seen as a normal structure. No other acute bowel finding. Diverticulosis without evidence of diverticulitis. Small hiatal hernia. Electronically Signed   By: Nelson Chimes M.D.   On: 01/22/2019 17:33   Dg Chest 1 View  Result Date: 01/22/2019 CLINICAL DATA:  Nausea and vomiting.  Chills. EXAM: CHEST  1 VIEW COMPARISON:  July 30, 2016 FINDINGS: No edema or consolidation. Heart size and pulmonary vascularity are normal. No adenopathy. There is arthropathy in each shoulder. IMPRESSION: No edema or consolidation. Electronically Signed   By: Lowella Grip III M.D.   On: 01/22/2019 16:25        Scheduled Meds:  feeding supplement (ENSURE ENLIVE)  237 mL Oral TID BM   folic acid  1 mg Oral Daily   heparin  5,000 Units Subcutaneous Q8H   pantoprazole  40 mg Oral q morning - 10a   PHENobarbital  32.4 mg Oral BID   phenytoin  100 mg Oral TID   sodium chloride flush  3 mL Intravenous Q12H   Continuous Infusions:  sodium chloride 100 mL/hr at 01/23/19 1811     LOS: 0 days    Time spent: 30 minutes    Kathie Dike, MD [facility]   If 7PM-7AM, please contact night-coverage [email or web address]  01/23/2019, 8:15 PM

## 2019-01-24 DIAGNOSIS — F1010 Alcohol abuse, uncomplicated: Secondary | ICD-10-CM | POA: Diagnosis not present

## 2019-01-24 DIAGNOSIS — N179 Acute kidney failure, unspecified: Secondary | ICD-10-CM | POA: Diagnosis not present

## 2019-01-24 DIAGNOSIS — K210 Gastro-esophageal reflux disease with esophagitis: Secondary | ICD-10-CM | POA: Diagnosis not present

## 2019-01-24 DIAGNOSIS — R112 Nausea with vomiting, unspecified: Secondary | ICD-10-CM | POA: Diagnosis not present

## 2019-01-24 DIAGNOSIS — K529 Noninfective gastroenteritis and colitis, unspecified: Secondary | ICD-10-CM | POA: Diagnosis not present

## 2019-01-24 LAB — BASIC METABOLIC PANEL
Anion gap: 10 (ref 5–15)
BUN: 11 mg/dL (ref 8–23)
CO2: 23 mmol/L (ref 22–32)
Calcium: 9.1 mg/dL (ref 8.9–10.3)
Chloride: 105 mmol/L (ref 98–111)
Creatinine, Ser: 1.09 mg/dL — ABNORMAL HIGH (ref 0.44–1.00)
GFR calc Af Amer: >60 mL/min (ref >60)
GFR calc non Af Amer: 55 mL/min — ABNORMAL LOW (ref >60)
Glucose, Bld: 96 mg/dL (ref 70–99)
Potassium: 4.8 mmol/L (ref 3.5–5.1)
Sodium: 138 mmol/L (ref 135–145)

## 2019-01-24 LAB — URINE CULTURE: Culture: >=100000 — AB

## 2019-01-24 LAB — HIV ANTIBODY (ROUTINE TESTING W REFLEX): HIV Screen 4th Generation wRfx: NONREACTIVE

## 2019-01-24 MED ORDER — ONDANSETRON 4 MG PO TBDP: 4.0000 mg | ORAL_TABLET | Freq: Three times a day (TID) | ORAL | 0 refills | Status: DC | PRN

## 2019-01-24 NOTE — TOC Transition Note (Signed)
Transition of Care [facility]) - CM/SW Discharge Note   Patient Details  Name: [name] MRN: [id number] Date of Birth: 07/16/1956  Transition of Care [facility]) CM/SW Contact:  Mykalah Saari, Chrystine Oiler, RN Phone Number: 01/24/2019, 11:26 AM   Clinical Narrative:   Patient discharging today. Lives with a female friend. Has a home aide daily 10:30 am to 4 pm. Walks with a quad cane. Has a RW, BSC and shower seat. Recommended for home health PT. Patient agreeable. Offer choice from CMS list. Patient elects [facility]. Referral given to [employer]     Final next level of care: Home w Home Health Services Barriers to Discharge: No Barriers Identified   Patient Goals and CMS Choice Patient states their goals for this hospitalization and ongoing recovery are:: return home ASAP CMS Medicare.gov Compare Post Acute Care list provided to:: Patient Choice offered to / list presented to : Patient       Discharge Plan and Services   Discharge Planning Services: CM Consult Post Acute Care Choice: Home Health              HH Arranged: PT [facility] Agency: [facility]     Readmission Risk Interventions No flowsheet data found.   Expected Discharge Plan: Home w Home Health Services Barriers to Discharge: No Barriers Identified   Patient Goals and CMS Choice Patient states their goals for this hospitalization and ongoing recovery are:: return home ASAP CMS Medicare.gov Compare Post Acute Care list provided to:: Patient Choice offered to / list presented to : Patient  Expected Discharge Plan and Services Expected Discharge Plan: Home w Home Health Services   Discharge Planning Services: CM Consult Post Acute Care Choice: Home Health Living arrangements for the past 2 months: Mobile Home Expected Discharge Date: 01/25/19                   HH Arranged: PT HH Agency: [facility]  Prior Living Arrangements/Services Living  arrangements for the past 2 months: Mobile Home Lives with:: Friends Patient language and need for interpreter reviewed:: Yes Do you feel safe going back to the place where you live?: Yes      Need for Family Participation in Patient Care: No (Comment) Care giver support system in place?: Yes (comment) Current home services: DME, Homehealth aide Criminal Activity/Legal Involvement Pertinent to Current Situation/Hospitalization: No - Comment as needed  Activities of Daily Living Home Assistive Devices/Equipment: Environmental consultant (specify type), Cane (specify quad or straight) ADL Screening (condition at time of admission) Patient's cognitive ability adequate to safely complete daily activities?: Yes Is the patient deaf or have difficulty hearing?: No Does the patient have difficulty seeing, even when wearing glasses/contacts?: No Does the patient have difficulty concentrating, remembering, or making decisions?: No Patient able to express need for assistance with ADLs?: Yes Does the patient have difficulty dressing or bathing?: Yes(min assist needed) Independently performs ADLs?: Yes (appropriate for developmental age)(with min assist) Does the patient have difficulty walking or climbing stairs?: Yes(requires assist or cane/walker) Weakness of Legs: Right Weakness of Arms/Hands: Right(limited movement to RUE & RLE)  Permission Sought/Granted   Permission granted to share information with : Yes, Verbal Permission Granted  Share Information with NAME: [facility]           Emotional Assessment   Attitude/Demeanor/Rapport: Engaged Affect (typically observed): Calm Orientation: : Oriented to Self, Oriented to Place, Oriented to  Time, Oriented to Situation Alcohol /  Substance Use: Not Applicable Psych Involvement: No (comment)  Admission diagnosis:  Hypokalemia [E87.6] AKI (acute kidney injury) (HCC) [N17.9] Nausea vomiting and diarrhea [R11.2, R19.7] Hypotension, unspecified  hypotension type [I95.9] Patient Active Problem List   Diagnosis Date Noted  . Acute kidney injury (HCC) 01/22/2019  . History of stroke 01/22/2019  . Asthma 01/22/2019  . Gastroenteritis 01/22/2019  . Stroke (HCC) 06/14/2018  . Dyspepsia   . Cirrhosis of liver without ascites (HCC) 04/21/2017  . AKI (acute kidney injury) (HCC) 07/30/2016  . Anemia 11/25/2015  . Reflux esophagitis   . Sinus tachycardia (HCC) 10/10/2014  . Hypokalemia 10/10/2014  . Seizure disorder (HCC) 10/10/2014  . Alcohol abuse 09/05/2012  . GERD (gastroesophageal reflux disease) 09/05/2012  . Upper abdominal pain 09/05/2012  . FX CLOSED TIBIA NOS 07/27/2007  . STROKE-With also h/o Aneurysm clipping? 07/25/2007  . SEIZURES 07/25/2007   PCP:  Avon Gully, MD Pharmacy:   [facility] [place] [place] - [address] [place] [address] Phone: [phone] Fax: [phone]

## 2019-01-24 NOTE — Discharge Summary (Signed)
Physician Discharge Summary  [name] [id number]:[id number] DOB: 08/26/1956 DOA: 01/22/2019  PCP: [name], MD  Admit date: 01/22/2019 Discharge date: 01/24/2019  Admitted From: Home Disposition: Home  Recommendations for Outpatient Follow-up:  1. Follow up with PCP in 1-2 weeks 2. Please obtain BMP/CBC in one week 3. ACE inhibitor discontinued at the time of discharge due to recovering AKI.  Please reevaluate timing of restarting ACE when appropriate  Home Health: Home health PT Equipment/Devices:  Discharge Condition: Stable CODE STATUS: Full code Diet recommendation: Regular diet  Brief/Interim Summary: 63 year old female with a history of prior CVA, seizure disorder, was admitted to the hospital with frequent nausea and vomiting secondary to gastroenteritis.  She was found to be severely hypokalemic, dehydrated and had acute kidney injury.  Patient was hydrated with IV fluids with improvement of renal function.  Potassium was corrected.  Diet was slowly advanced from n.p.o., to clear liquids to solid food.  Patient is tolerating solid food without recurrent nausea and vomiting.  Her ACE inhibitor has been held in the setting of AKI and can be reevaluated as an outpatient.  Patient was seen by physical therapy with recommendations for home health PT.  She feels ready for discharge.  Discharge Diagnoses:  Principal Problem:   Acute kidney injury ([place]) Active Problems:   Alcohol abuse   GERD (gastroesophageal reflux disease)   Hypokalemia   Seizure disorder ([place])   Asthma   Gastroenteritis    Discharge Instructions  Discharge Instructions    Diet - low sodium heart healthy   Complete by:  As directed    Increase activity slowly   Complete by:  As directed      Allergies as of 01/24/2019   No Known Allergies     Medication List    STOP taking these medications   lisinopril 40 MG tablet Commonly known as:  PRINIVIL,ZESTRIL   naproxen 500 MG tablet Commonly known  as:  NAPROSYN   solifenacin 5 MG tablet Commonly known as:  VESICARE     TAKE these medications   albuterol 108 (90 Base) MCG/ACT inhaler Commonly known as:  PROVENTIL HFA;VENTOLIN HFA Inhale 2 puffs into the lungs every 6 (six) hours as needed for wheezing or shortness of breath.   Ensure Plus Liqd Take 237 mLs by mouth 3 (three) times daily between meals.   HYDROcodone-acetaminophen 5-325 MG tablet Commonly known as:  NORCO/VICODIN One tablet by mouth every six hours as needed for pain. What changed:    how much to take  how to take this  when to take this  reasons to take this  additional instructions   ipratropium-albuterol 0.5-2.5 (3) MG/3ML Soln Commonly known as:  DUONEB Take 3 mLs by nebulization every 6 (six) hours as needed (for shortness of breath/wheezing).   linaclotide 145 MCG Caps capsule Commonly known as:  Linzess 1 PO WITH your first meal What changed:    how much to take  how to take this  when to take this  additional instructions   ondansetron 4 MG disintegrating tablet Commonly known as:  Zofran ODT Take 1 tablet (4 mg total) by mouth every 8 (eight) hours as needed for nausea or vomiting.   pantoprazole 40 MG tablet Commonly known as:  PROTONIX 1 PO 30 MINS PRIOR TO FIRST MEAL What changed:    how much to take  how to take this  when to take this  additional instructions   phenytoin 100 MG ER capsule Commonly known as:  DILANTIN Take 100 mg by mouth 3 (three) times daily.       No Known Allergies  Consultations:     Procedures/Studies: Ct Abdomen Pelvis Wo Contrast  Result Date: 01/22/2019 CLINICAL DATA:  Right lower quadrant pain with nausea and vomiting over the last 2 days. EXAM: CT ABDOMEN AND PELVIS WITHOUT CONTRAST TECHNIQUE: Multidetector CT imaging of the abdomen and pelvis was performed following the standard protocol without IV contrast. COMPARISON:  03/02/2017 FINDINGS: Lower chest: Small hiatal hernia.   Lung bases are clear. Hepatobiliary: Normal without contrast. Pancreas: Normal Spleen: Normal Adrenals/Urinary Tract: Adrenal glands are normal. Kidneys are normal. No cyst, mass, stone or hydronephrosis. Bladder is normal. Stomach/Bowel: Appendix is normal. No other bowel finding of note. No sign of ileus or obstruction. Sigmoid diverticulosis without evidence of diverticulitis. Vascular/Lymphatic: Aortic atherosclerosis. No aneurysm. IVC is normal. No retroperitoneal adenopathy. Reproductive: Previous hysterectomy.  No pelvic mass. Other: No free fluid or air. Musculoskeletal: Ordinary degenerative changes affect the spine. IMPRESSION: No acute finding to explain the clinical presentation. Specifically, the appendix as well seen as a normal structure. No other acute bowel finding. Diverticulosis without evidence of diverticulitis. Small hiatal hernia. Electronically Signed   By: Nelson Chimes M.D.   On: 01/22/2019 17:33   Dg Chest 1 View  Result Date: 01/22/2019 CLINICAL DATA:  Nausea and vomiting.  Chills. EXAM: CHEST  1 VIEW COMPARISON:  July 30, 2016 FINDINGS: No edema or consolidation. Heart size and pulmonary vascularity are normal. No adenopathy. There is arthropathy in each shoulder. IMPRESSION: No edema or consolidation. Electronically Signed   By: Lowella Grip III M.D.   On: 01/22/2019 16:25       Subjective: Feeling better.  Tolerating solid diet.  No vomiting.  Discharge Exam: Vitals:   01/23/19 2233 01/24/19 0538 01/24/19 0619 01/24/19 1417  BP: (!) 145/53 (!) 149/71  (!) 144/77  Pulse: 86 80  88  Resp: 20 16  18   Temp: 99.2 F (37.3 C) 98.2 F (36.8 C)  97.8 F (36.6 C)  TempSrc: Oral Oral  Oral  SpO2: 100% 98%  100%  Weight:   74.3 kg   Height:        General: Pt is alert, awake, not in acute distress Cardiovascular: RRR, S1/S2 +, no rubs, no gallops Respiratory: CTA bilaterally, no wheezing, no rhonchi Abdominal: Soft, NT, ND, bowel sounds + Extremities: no  edema, no cyanosis, right upper extremity contracture    The results of significant diagnostics from this hospitalization (including imaging, microbiology, ancillary and laboratory) are listed below for reference.     Microbiology: Recent Results (from the past 240 hour(s))  Urine culture     Status: Abnormal   Collection Time: 01/22/19  3:40 PM  Result Value Ref Range Status   Specimen Description   Final    URINE, CATHETERIZED Performed at [facility], [address] [place], [place] [address]    Special Requests   Final    NONE Performed at [facility], [address], [place], [place] [address]    Culture >=100,000 COLONIES/mL STAPHYLOCOCCUS EPIDERMIDIS (A)  Final   Report Status 01/24/2019 FINAL  Final   Organism ID, Bacteria STAPHYLOCOCCUS EPIDERMIDIS (A)  Final      Susceptibility   Staphylococcus epidermidis - MIC*    CIPROFLOXACIN <=0.5 SENSITIVE Sensitive     GENTAMICIN <=0.5 SENSITIVE Sensitive     NITROFURANTOIN <=16 SENSITIVE Sensitive     OXACILLIN <=0.25 SENSITIVE Sensitive     TETRACYCLINE <=1  SENSITIVE Sensitive     VANCOMYCIN <=0.5 SENSITIVE Sensitive     TRIMETH/SULFA <=10 SENSITIVE Sensitive     CLINDAMYCIN <=0.25 SENSITIVE Sensitive     RIFAMPIN <=0.5 SENSITIVE Sensitive     Inducible Clindamycin NEGATIVE Sensitive     * >=100,000 COLONIES/mL STAPHYLOCOCCUS EPIDERMIDIS     Labs: BNP (last 3 results) No results for input(s): BNP in the last 8760 hours. Basic Metabolic Panel: Recent Labs  Lab 01/22/19 1520 01/23/19 0436 01/23/19 1428 01/24/19 0557  NA 136 135 136 138  K 2.5* 2.8* 5.2* 4.8  CL 76* 87* 102 105  CO2 41* 35* 23 23  GLUCOSE 129* 120* 90 96  BUN 21 19 14 11   CREATININE 3.33* 1.99* 1.45* 1.09*  CALCIUM 8.6* 8.1* 8.4* 9.1  MG 1.9 1.8  --   --    Liver Function Tests: Recent Labs  Lab 01/22/19 1520  AST 68*  ALT 35  ALKPHOS 101  BILITOT 1.2  PROT 7.5  ALBUMIN 3.7   Recent Labs  Lab 01/22/19 1520  LIPASE 33   No  results for input(s): AMMONIA in the last 168 hours. CBC: Recent Labs  Lab 01/22/19 1520 01/23/19 0436  WBC 5.4 4.5  NEUTROABS 3.9  --   HGB 10.5* 10.3*  HCT 32.0* 31.5*  MCV 92.2 94.3  PLT 147* 115*   Cardiac Enzymes: Recent Labs  Lab 01/22/19 1520  TROPONINI <0.03   BNP: Invalid input(s): POCBNP CBG: No results for input(s): GLUCAP in the last 168 hours. D-Dimer No results for input(s): DDIMER in the last 72 hours. Hgb A1c No results for input(s): HGBA1C in the last 72 hours. Lipid Profile No results for input(s): CHOL, HDL, LDLCALC, TRIG, CHOLHDL, LDLDIRECT in the last 72 hours. Thyroid function studies No results for input(s): TSH, T4TOTAL, T3FREE, THYROIDAB in the last 72 hours.  Invalid input(s): FREET3 Anemia work up No results for input(s): VITAMINB12, FOLATE, FERRITIN, TIBC, IRON, RETICCTPCT in the last 72 hours. Urinalysis    Component Value Date/Time   COLORURINE AMBER (A) 01/22/2019 1540   APPEARANCEUR HAZY (A) 01/22/2019 1540   LABSPEC 1.015 01/22/2019 1540   PHURINE 7.0 01/22/2019 1540   GLUCOSEU NEGATIVE 01/22/2019 1540   HGBUR NEGATIVE 01/22/2019 1540   BILIRUBINUR NEGATIVE 01/22/2019 1540   KETONESUR NEGATIVE 01/22/2019 1540   PROTEINUR 30 (A) 01/22/2019 1540   UROBILINOGEN 0.2 12/26/2014 1927   NITRITE NEGATIVE 01/22/2019 1540   LEUKOCYTESUR NEGATIVE 01/22/2019 1540   Sepsis Labs Invalid input(s): PROCALCITONIN,  WBC,  LACTICIDVEN Microbiology Recent Results (from the past 240 hour(s))  Urine culture     Status: Abnormal   Collection Time: 01/22/19  3:40 PM  Result Value Ref Range Status   Specimen Description   Final    URINE, CATHETERIZED Performed at [facility], [address] [place], [place] [address]    Special Requests   Final    NONE Performed at [facility] [place], [address], [place], [place] [address]    Culture >=100,000 COLONIES/mL STAPHYLOCOCCUS EPIDERMIDIS (A)  Final   Report Status 01/24/2019 FINAL  Final   Organism  ID, Bacteria STAPHYLOCOCCUS EPIDERMIDIS (A)  Final      Susceptibility   Staphylococcus epidermidis - MIC*    CIPROFLOXACIN <=0.5 SENSITIVE Sensitive     GENTAMICIN <=0.5 SENSITIVE Sensitive     NITROFURANTOIN <=16 SENSITIVE Sensitive     OXACILLIN <=0.25 SENSITIVE Sensitive     TETRACYCLINE <=1 SENSITIVE Sensitive     VANCOMYCIN <=0.5 SENSITIVE Sensitive  TRIMETH/SULFA <=10 SENSITIVE Sensitive     CLINDAMYCIN <=0.25 SENSITIVE Sensitive     RIFAMPIN <=0.5 SENSITIVE Sensitive     Inducible Clindamycin NEGATIVE Sensitive     * >=100,000 COLONIES/mL STAPHYLOCOCCUS EPIDERMIDIS     Time coordinating discharge: 27mins  SIGNED:   Kathie Dike, MD  [facility] 01/24/2019, 7:22 PM   If 7PM-7AM, please contact night-coverage [email or web address]

## 2019-01-24 NOTE — Plan of Care (Signed)

## 2019-01-24 NOTE — Evaluation (Signed)
Physical Therapy Evaluation Patient Details Name: [name] MRN: [id number] DOB: 1955-12-22 Today's Date: 01/24/2019   History of Present Illness  [name] is a 63 y.o. female with medical history significant for history of CVA age 23 resulting in seizure disorder, alcohol use disorder, liver cirrhosis, hypertension, and PUD who presents to the ED with 3-4 days of sudden onset frequent nausea and vomiting with inability to maintain adequate oral intake.  She has been unable to keep foods, liquids, or her home medications down.  She reports associated right-sided abdominal pain.  She feels that she has decreased urine output without dysuria.  She reports subjective fevers and chills.  She denies any diarrhea.  She denies any associated chest pain, dyspnea.  She denies any unusual changes in her diet.  She denies any sick contacts.    Clinical Impression  Patient functioning near baseline for functional mobility and gait, other than requiring assistance for sitting up at bedside due to generalized weakness, able to transfer to commode in bathroom using grab bar, labored movement for sit to stands from commode with good return for using quad-cane and able to ambulate in hallway with mostly step-through gait pattern without loss of balance.  Patient tolerated sitting up in chair after therapy - RN notified.  Patient will benefit from continued physical therapy in hospital and recommended venue below to increase strength, balance, endurance for safe ADLs and gait.     Follow Up Recommendations Home health PT;Supervision for mobility/OOB;Supervision - Intermittent    Equipment Recommendations  None recommended by PT    Recommendations for Other Services       Precautions / Restrictions Precautions Precautions: Fall Restrictions Weight Bearing Restrictions: No      Mobility  Bed Mobility Overal bed mobility: Needs Assistance Bed Mobility: Supine to Sit     Supine to sit: Min  assist     General bed mobility comments: requires assistance to help pull self up to sitting  Transfers Overall transfer level: Needs assistance Equipment used: Quad cane Transfers: Sit to/from [employer] to Stand: Min guard Stand pivot transfers: Min guard       General transfer comment: labored movement, requires repeated attempts to complete sit to stands from bedside and commode, but can do it mostly by herself  Ambulation/Gait Ambulation/Gait assistance: Supervision;Min guard Gait Distance (Feet): 55 Feet Assistive device: Quad cane Gait Pattern/deviations: Decreased step length - right;Decreased stance time - right;Decreased stride length;Step-through pattern Gait velocity: decreased   General Gait Details: slightly labored cadence without loss of balance with mostly 2 point gait pattern using quad-cane, limited secondary to c/o fatigue  Stairs            Wheelchair Mobility    Modified Rankin (Stroke Patients Only)       Balance Overall balance assessment: Needs assistance Sitting-balance support: Feet supported;No upper extremity supported Sitting balance-Leahy Scale: Good     Standing balance support: Single extremity supported;During functional activity Standing balance-Leahy Scale: Fair Standing balance comment: fair/good using quad-cane                             Pertinent Vitals/Pain Pain Assessment: No/denies pain    Home Living Family/patient expects to be discharged to:: Private residence Living Arrangements: Non-relatives/Friends Available Help at Discharge: Family;Personal care attendant;Available PRN/intermittently Type of Home: Mobile home Home Access: Ramped entrance     Home Layout: One level Home Equipment: Kasandra Knudsen -  quad;Walker - 2 wheels;Shower seat;Bedside commode      Prior Function Level of Independence: Needs assistance   Gait / Transfers Assistance Needed: household ambulator using  quad-cane  ADL's / Homemaking Assistance Needed: home aide from 10:30 am to 4 pm x 7 days/week, live in female friend can help some        Hand Dominance        Extremity/Trunk Assessment   Upper Extremity Assessment Upper Extremity Assessment: RUE deficits/detail;Generalized weakness;LUE deficits/detail RUE Deficits / Details: grossly 0/5 non- functional, constractures held against body LUE Deficits / Details: grossly 4+/5    Lower Extremity Assessment Lower Extremity Assessment: Generalized weakness    Cervical / Trunk Assessment Cervical / Trunk Assessment: Normal  Communication   Communication: No difficulties  Cognition Arousal/Alertness: Awake/alert Behavior During Therapy: WFL for tasks assessed/performed Overall Cognitive Status: Within Functional Limits for tasks assessed                                        General Comments      Exercises     Assessment/Plan    PT Assessment Patient needs continued PT services  PT Problem List Decreased strength;Decreased activity tolerance;Decreased balance;Decreased mobility       PT Treatment Interventions Therapeutic exercise;Gait training;Stair training;Functional mobility training;Therapeutic activities;Patient/family education    PT Goals (Current goals can be found in the Care Plan section)  Acute Rehab PT Goals Patient Stated Goal: return home with home aide and friend to assist PT Goal Formulation: With patient Time For Goal Achievement: 01/31/19 Potential to Achieve Goals: Good    Frequency Min 3X/week   Barriers to discharge        Co-evaluation               AM-PAC PT "6 Clicks" Mobility  Outcome Measure Help needed turning from your back to your side while in a flat bed without using bedrails?: None Help needed moving from lying on your back to sitting on the side of a flat bed without using bedrails?: A Little Help needed moving to and from a bed to a chair (including a  wheelchair)?: A Little Help needed standing up from a chair using your arms (e.g., wheelchair or bedside chair)?: A Little Help needed to walk in hospital room?: A Little Help needed climbing 3-5 steps with a railing? : A Lot 6 Click Score: 18    End of Session Equipment Utilized During Treatment: Gait belt Activity Tolerance: Patient tolerated treatment well;Patient limited by fatigue Patient left: in chair;with call bell/phone within reach;with chair alarm set Nurse Communication: Mobility status PT Visit Diagnosis: Unsteadiness on feet (R26.81);Other abnormalities of gait and mobility (R26.89);Muscle weakness (generalized) (M62.81)    Time: 4967-5916 PT Time Calculation (min) (ACUTE ONLY): 27 min   Charges:   PT Evaluation $PT Eval Moderate Complexity: 1 Mod PT Treatments $Therapeutic Activity: 23-37 mins        11:09 AM, 01/24/19 Lonell Grandchild, MPT Physical Therapist with [facility] [phone] office [phone] mobile phone

## 2019-01-24 NOTE — Plan of Care (Signed)
  Problem: Education: Goal: Knowledge of General Education information will improve Description Including pain rating scale, medication(s)/side effects and non-pharmacologic comfort measures 01/24/2019 1403 by Rance Muir, RN Outcome: Adequate for Discharge 01/24/2019 1121 by Rance Muir, RN Outcome: Progressing   Problem: Health Behavior/Discharge Planning: Goal: Ability to manage health-related needs will improve 01/24/2019 1403 by Rance Muir, RN Outcome: Adequate for Discharge 01/24/2019 1121 by Rance Muir, RN Outcome: Progressing   Problem: Clinical Measurements: Goal: Ability to maintain clinical measurements within normal limits will improve 01/24/2019 1403 by Rance Muir, RN Outcome: Adequate for Discharge 01/24/2019 1121 by Rance Muir, RN Outcome: Progressing Goal: Will remain free from infection 01/24/2019 1403 by Rance Muir, RN Outcome: Adequate for Discharge 01/24/2019 1121 by Rance Muir, RN Outcome: Progressing Goal: Diagnostic test results will improve 01/24/2019 1403 by Rance Muir, RN Outcome: Adequate for Discharge 01/24/2019 1121 by Rance Muir, RN Outcome: Progressing Goal: Respiratory complications will improve 01/24/2019 1403 by Rance Muir, RN Outcome: Adequate for Discharge 01/24/2019 1121 by Rance Muir, RN Outcome: Progressing Goal: Cardiovascular complication will be avoided 01/24/2019 1403 by Rance Muir, RN Outcome: Adequate for Discharge 01/24/2019 1121 by Rance Muir, RN Outcome: Progressing   Problem: Activity: Goal: Risk for activity intolerance will decrease 01/24/2019 1403 by Rance Muir, RN Outcome: Adequate for Discharge 01/24/2019 1121 by Rance Muir, RN Outcome: Progressing   Problem: Nutrition: Goal: Adequate nutrition will be maintained 01/24/2019 1403 by Rance Muir, RN Outcome: Adequate for Discharge 01/24/2019 1121 by Rance Muir, RN Outcome: Progressing   Problem: Coping: Goal: Level of anxiety will decrease 01/24/2019 1403 by Rance Muir, RN Outcome: Adequate for Discharge 01/24/2019  1121 by Rance Muir, RN Outcome: Progressing   Problem: Elimination: Goal: Will not experience complications related to bowel motility 01/24/2019 1403 by Rance Muir, RN Outcome: Adequate for Discharge 01/24/2019 1121 by Rance Muir, RN Outcome: Progressing Goal: Will not experience complications related to urinary retention 01/24/2019 1403 by Rance Muir, RN Outcome: Adequate for Discharge 01/24/2019 1121 by Rance Muir, RN Outcome: Progressing   Problem: Pain Managment: Goal: General experience of comfort will improve 01/24/2019 1403 by Rance Muir, RN Outcome: Adequate for Discharge 01/24/2019 1121 by Rance Muir, RN Outcome: Progressing   Problem: Safety: Goal: Ability to remain free from injury will improve 01/24/2019 1403 by Rance Muir, RN Outcome: Adequate for Discharge 01/24/2019 1121 by Rance Muir, RN Outcome: Progressing   Problem: Skin Integrity: Goal: Risk for impaired skin integrity will decrease 01/24/2019 1403 by Rance Muir, RN Outcome: Adequate for Discharge 01/24/2019 1121 by Rance Muir, RN Outcome: Progressing

## 2019-01-24 NOTE — Plan of Care (Signed)
  Problem: Acute Rehab PT Goals(only PT should resolve) Goal: Pt Will Go Supine/Side To Sit Outcome: Progressing Flowsheets (Taken 01/24/2019 1111) Pt will go Supine/Side to Sit: with supervision Goal: Patient Will Transfer Sit To/From Stand Outcome: Progressing Flowsheets (Taken 01/24/2019 1111) Patient will transfer sit to/from stand: with supervision Goal: Pt Will Transfer Bed To Chair/Chair To Bed Outcome: Progressing Flowsheets (Taken 01/24/2019 1111) Pt will Transfer Bed to Chair/Chair to Bed: with supervision Goal: Pt Will Ambulate Outcome: Progressing Flowsheets (Taken 01/24/2019 1111) Pt will Ambulate: 75 feet; with supervision; with cane Note:  Using quad-cane   11:12 AM, 01/24/19 Lonell Grandchild, MPT Physical Therapist with [facility] [phone] office [phone] mobile phone

## 2019-02-14 ENCOUNTER — Other Ambulatory Visit: Payer: Self-pay

## 2019-02-14 ENCOUNTER — Encounter: Payer: Self-pay | Admitting: Gastroenterology

## 2019-02-14 ENCOUNTER — Ambulatory Visit (INDEPENDENT_AMBULATORY_CARE_PROVIDER_SITE_OTHER): Payer: Medicare Other | Admitting: Gastroenterology

## 2019-02-14 ENCOUNTER — Telehealth: Payer: Self-pay

## 2019-02-14 ENCOUNTER — Ambulatory Visit (HOSPITAL_COMMUNITY): Payer: Medicare Other

## 2019-02-14 DIAGNOSIS — K219 Gastro-esophageal reflux disease without esophagitis: Secondary | ICD-10-CM | POA: Diagnosis not present

## 2019-02-14 DIAGNOSIS — K7030 Alcoholic cirrhosis of liver without ascites: Secondary | ICD-10-CM | POA: Diagnosis not present

## 2019-02-14 MED ORDER — LIDOCAINE VISCOUS HCL 2 % MT SOLN: OROMUCOSAL | 5 refills | Status: DC

## 2019-02-14 NOTE — Progress Notes (Signed)
Subjective:    Patient ID: [name], female    DOB: 12-06-1955, 63 y.o.   MRN: [id number]  Primary Care Physician:  [name], MD  Primary GI:  Barney Drain, MD   Patient Location: home   Provider Location: [place] office   Reason for Visit: ETOH CIRRHOSIS/GERD   Persons present on the virtual encounter, with roles: patient, myself (provider), MARTINA BOOTH CMA (update meds/allergies)   Total time (minutes) spent on medical discussion:  17 MINUTES   Due to COVID-19, visit was VIA TELEPHONE VISIT DUE TO COVID 19. VISIT IS CONDUCTED VIRTUALLY AND WAS REQUESTED BY PATIENT.   Virtual Visit via TELEPHONE   I connected with Maia Rylander/HOPE HAIRSTON  and verified that I am speaking with the correct person using two identifiers.   I discussed the limitations, risks, security and privacy concerns of performing an evaluation and management service by telephone/video and the availability of in person appointments. I also discussed with the patient that there may be a patient responsible charge related to this service. The patient expressed understanding and agreed to proceed.  HPI HAVING PAIN UNDER HER BREAST FOR PAST WEEK: FEELS LIKE HEARTBURN PAIN. NAUSEA: OFF AND ON. VOMITED: A LITTLE THIS WEEK, NO BLOOD. BMs: RUNNY & DARK(3 LAST NIGHT) & NONE TODAY. NO ETOH THIS WEEK OR LAST WEEK. IT DOESN'T AGREE WITH HER STOMACH. TAKING ALKA SELTZER: >1 A DAY. NOT BEEN TAKING PANTOPRAZOLE SINCE D/C FROM [facility] APR 8. SOB: MODERATE,PAIN IN EPIGASTRIC PAIN CAUSING HER NOT TO BE ABLE TO TAKE A DEEP BREATH.   PT DENIES FEVER, CHILLS, HEMATOCHEZIA, HEMATEMESIS, nausea, vomiting, melena, diarrhea, CHEST PAIN, SHORTNESS OF BREATH, CHANGE IN BOWEL IN HABITS, constipation, abdominal pain, problems swallowing, OR heartburn or indigestion.  Past Medical History:  Diagnosis Date  . Alcohol abuse   . Asthma   . Contracture of muscle of hand    right  . GERD (gastroesophageal reflux disease)   . HTN  (hypertension)   . PUD (peptic ulcer disease)    remote  . Seizures ([place])    since stroke, no recent seizures  . Stroke [facility])    age 56, required brain surgery   Past Surgical History:  Procedure Laterality Date  . ABDOMINAL HYSTERECTOMY  2004   complete  . BRAIN SURGERY     age 18, stroke  . COLONOSCOPY    . COLONOSCOPY WITH PROPOFOL  10/03/2012   Dr. [name]: moderate diverticulosis, small internal hemorrhoids, TUBULAR ADENOMA. Next colonoscopy 2023 per Dr. [name].  . ESOPHAGOGASTRODUODENOSCOPY N/A 10/18/2014   Dr. Gala Romney during inpatient hospitalization: severe exudative/erosive reflux esophagitis as source of trivial upper GI bleed. No varices   . ESOPHAGOGASTRODUODENOSCOPY (EGD) WITH PROPOFOL  10/03/2012   Dr. [name]: stricture at [place] s/p dilation, mild gastritis negative H.pylori   . ESOPHAGOGASTRODUODENOSCOPY (EGD) WITH PROPOFOL N/A 12/27/2017   Procedure: ESOPHAGOGASTRODUODENOSCOPY (EGD) WITH PROPOFOL;  Surgeon: Danie Binder, MD;  Location: [facility];  Service: Endoscopy;  Laterality: N/A;  10:15am  . right tib-fib fracture  2008  . SAVORY DILATION  10/03/2012   Procedure: SAVORY DILATION;  Surgeon: Danie Binder, MD;  Location: [facility] ORS;  Service: Endoscopy;  Laterality: N/A;  started at 1303,  dilated 12.8-16mm   No Known Allergies  Current Outpatient Medications  Medication Sig    . acetaminophen (TYLENOL) 325 MG tablet Take 650 mg by mouth every 6 (six) hours as needed.    Marland Kitchen albuterol (PROVENTIL HFA;VENTOLIN HFA) 108 (90 BASE) MCG/ACT inhaler  Inhale 2 puffs into the lungs every 6 (six) hours as needed for wheezing or shortness of breath.     . ENSURE PLUS (ENSURE PLUS) LIQD Take 237 mLs by mouth 3 (three) times daily between meals.    Marland Kitchen HYDROcodone-acetaminophen (NORCO/VICODIN) 5-325 MG tablet One tablet by mouth every six hours as needed for pain.    Marland Kitchen ipratropium-albuterol (DUONEB) 0.5-2.5 (3) MG/3ML SOLN Take 3 mLs by nebulization every 6 (six) hours as  needed (for shortness of breath/wheezing).     Marland Kitchen linaclotide (LINZESS) 145 MCG CAPS capsule 1 PO WITH your first meal (Patient taking differently: Take 145 mcg by mouth daily before breakfast. your first meal) PRN   . ondansetron (ZOFRAN ODT) 4 MG disintegrating tablet Take 1 tablet (4 mg total) by mouth every 8 (eight) hours as needed for nausea or vomiting.    . pantoprazole (PROTONIX) 40 MG tablet 1 PO 30 MINS PRIOR TO FIRST MEAL (Patient taking differently: Take 40 mg by mouth every morning. [place])    . phenytoin (DILANTIN) 100 MG ER capsule Take 100 mg by mouth 3 (three) times daily.     Review of Systems PER HPI OTHERWISE ALL SYSTEMS ARE NEGATIVE.    Objective:   Physical Exam  TELEPHONE VISIT DUE TO COVID 19, VISIT IS CONDUCTED VIRTUALLY AND WAS REQUESTED BY PATIENT.      Assessment & Plan:

## 2019-02-14 NOTE — Telephone Encounter (Signed)
Pt's caregiver said the pharmacy only got refill for Lidocaine today and not her other medication.

## 2019-02-14 NOTE — Assessment & Plan Note (Addendum)
WELL COMPENSATED DISEASE. LAST LABS APR 2020: HB STABLE AT 10.8. NO BRBPR OR MELENA. LAST U/S SEP 2019.  COMPLETE ULTRASOUND AFTER JUN 1. FOLLOW UP IN 6 MOS.

## 2019-02-14 NOTE — Telephone Encounter (Signed)
[place] abd RUQ scheduled for 03/22/19 at 9:30am, arrive at 9:15am. NPO after midnight prior to test.  Called to inform pt of [place] appt, she gave phone to her caregiver. Caregiver informed and letter mailed.

## 2019-02-14 NOTE — Patient Instructions (Addendum)
DRINK WATER TO KEEP YOUR URINE LIGHT YELLOW.  AVOID REFLUX TRIGGERS. SEE INFO BELOW.  COMPLETE ULTRASOUND AFTER JUN 1.  PLEASE CALL THE OFFICE: [phone] IN TWO WEEKS IF SYMPTOMS ARE NOT IMPROVED.   FOLLOW UP IN 6 MOS.    Lifestyle and home remedies TO CONTROL HEARTBURN/UPPER ABDOMINALPAIN You may eliminate or reduce the frequency of heartburn by making the following lifestyle changes:  . Control your weight. Being overweight is a major risk factor for heartburn and GERD. Excess pounds put pressure on your abdomen, pushing up your stomach and causing acid to back up into your esophagus.   . Eat smaller meals. 4 TO 6 MEALS A DAY. This reduces pressure on the lower esophageal sphincter, helping to prevent the valve from opening and acid from washing back into your esophagus.   Dolphus Jenny your belt. Clothes that fit tightly around your waist put pressure on your abdomen and the lower esophageal sphincter.   . Eliminate heartburn triggers. Everyone has specific triggers. Common triggers such as fatty or fried foods, spicy food, tomato sauce, carbonated beverages, alcohol, chocolate, mint, garlic, onion, caffeine and nicotine may make heartburn worse.   Marland Kitchen Avoid stooping or bending. Tying your shoes is OK. Bending over for longer periods to weed your garden isn't, especially soon after eating.   . Don't lie down after a meal. Wait at least three to four hours after eating before going to bed, and don't lie down right after eating.   Alternative medicine . Several home remedies exist for treating GERD, but they provide only temporary relief. They include drinking baking soda (sodium bicarbonate) added to water or drinking other fluids such as baking soda mixed with cream of tartar and water. . Although these liquids create temporary relief by neutralizing, washing away or buffering acids, eventually they aggravate the situation by adding gas and fluid to your stomach, increasing pressure and  causing more acid reflux. Further, adding more sodium to your diet may increase your blood pressure and add stress to your heart, and excessive bicarbonate ingestion can alter the acid-base balance in your body.

## 2019-02-14 NOTE — Assessment & Plan Note (Signed)
SYMPTOMS NOT IDEALLY CONTROLLED OFF PPI SINCE APR 8.  DRINK WATER TO KEEP YOUR URINE LIGHT YELLOW. AVOID REFLUX TRIGGERS.  HANDOUT GIVEN. COMPLETE ULTRASOUND AFTER JUN 1. PLEASE CALL THE OFFICE: [phone] IN TWO WEEKS IF SYMPTOMS ARE NOT IMPROVED.   FOLLOW UP IN 6 MOS.

## 2019-02-15 MED ORDER — PANTOPRAZOLE SODIUM 40 MG PO TBEC: 40.0000 mg | DELAYED_RELEASE_TABLET | Freq: Every morning | ORAL | 11 refills | Status: DC

## 2019-02-15 NOTE — Telephone Encounter (Signed)
PLEASE CALL PT.  Rx sent.  

## 2019-02-15 NOTE — Telephone Encounter (Signed)
Called and informed pt.  

## 2019-02-21 ENCOUNTER — Other Ambulatory Visit: Payer: Self-pay

## 2019-02-21 ENCOUNTER — Ambulatory Visit: Payer: Medicare Other | Admitting: Orthopaedic Surgery

## 2019-02-28 ENCOUNTER — Encounter: Payer: Self-pay | Admitting: Orthopaedic Surgery

## 2019-02-28 ENCOUNTER — Ambulatory Visit (INDEPENDENT_AMBULATORY_CARE_PROVIDER_SITE_OTHER): Payer: Medicare Other | Admitting: Orthopaedic Surgery

## 2019-02-28 ENCOUNTER — Other Ambulatory Visit: Payer: Self-pay

## 2019-02-28 DIAGNOSIS — M25561 Pain in right knee: Secondary | ICD-10-CM

## 2019-02-28 DIAGNOSIS — G8929 Other chronic pain: Secondary | ICD-10-CM

## 2019-02-28 DIAGNOSIS — I6789 Other cerebrovascular disease: Secondary | ICD-10-CM | POA: Diagnosis not present

## 2019-02-28 DIAGNOSIS — R531 Weakness: Secondary | ICD-10-CM | POA: Diagnosis not present

## 2019-02-28 MED ORDER — HYDROCODONE-ACETAMINOPHEN 5-325 MG PO TABS: ORAL_TABLET | ORAL | 0 refills | Status: DC

## 2019-02-28 NOTE — Progress Notes (Signed)
Virtual Visit via Telephone Note  I connected with [name] on 02/28/19 at  9:10 AM EDT by telephone and verified that I am speaking with the correct person using two identifiers.  Location: Patient: home Provider: home   I discussed the limitations, risks, security and privacy concerns of performing an evaluation and management service by telephone and the availability of in person appointments. I also discussed with the patient that there may be a patient responsible charge related to this service. The patient expressed understanding and agreed to proceed.   History of Present Illness: She has more pain and swelling of the right knee. She feels she will need another injection.  She has no giving way, no trauma.  I will set up appointment to be seen in the office.  She needs refill on her pain medicine and I will do this today.   Observations/Objective:   Assessment and Plan: Per above  Follow Up Instructions: Encounter Diagnoses  Name Primary?  . Chronic pain of right knee Yes  . Right sided weakness   . STROKE-With also h/o Aneurysm clipping?    I have reviewed the [place] web site prior to prescribing narcotic medicine for this patient.   Call if any problem.  Precautions discussed.       I discussed the assessment and treatment plan with the patient. The patient was provided an opportunity to ask questions and all were answered. The patient agreed with the plan and demonstrated an understanding of the instructions.   The patient was advised to call back or seek an in-person evaluation if the symptoms worsen or if the condition fails to improve as anticipated.  I provided 6 minutes of non-face-to-face time during this encounter.   Sanjuana Kava, MD

## 2019-03-13 ENCOUNTER — Ambulatory Visit: Payer: Medicare Other | Admitting: Orthopaedic Surgery

## 2019-03-13 ENCOUNTER — Encounter: Payer: Self-pay | Admitting: Orthopaedic Surgery

## 2019-03-13 ENCOUNTER — Ambulatory Visit (INDEPENDENT_AMBULATORY_CARE_PROVIDER_SITE_OTHER): Payer: Medicare Other | Admitting: Orthopaedic Surgery

## 2019-03-13 ENCOUNTER — Other Ambulatory Visit: Payer: Self-pay

## 2019-03-13 VITALS — Temp 96.8°F

## 2019-03-13 DIAGNOSIS — G8929 Other chronic pain: Secondary | ICD-10-CM

## 2019-03-13 DIAGNOSIS — M25561 Pain in right knee: Secondary | ICD-10-CM | POA: Diagnosis not present

## 2019-03-13 NOTE — Progress Notes (Signed)
PROCEDURE NOTE:  The patient requests injections of the right knee , verbal consent was obtained.  The right knee was prepped appropriately after time out was performed.   Sterile technique was observed and injection of 1 cc of Depo-Medrol 40 mg with several cc's of plain xylocaine. Anesthesia was provided by ethyl chloride and a 20-gauge needle was used to inject the knee area. The injection was tolerated well.  A band aid dressing was applied.  The patient was advised to apply ice later today and tomorrow to the injection sight as needed.  Return in six weeks.  Call if any problem.  Precautions discussed.   Electronically Signed Sanjuana Kava, MD 5/26/20209:33 AM

## 2019-03-19 ENCOUNTER — Telehealth: Payer: Self-pay | Admitting: Orthopaedic Surgery

## 2019-03-19 NOTE — Telephone Encounter (Signed)
Hydrocodone-Acetaminophen 5/325mg Qty 56 Tablets  PATIENT USES [facility] 

## 2019-03-20 MED ORDER — HYDROCODONE-ACETAMINOPHEN 5-325 MG PO TABS: ORAL_TABLET | ORAL | 0 refills | Status: DC

## 2019-03-22 ENCOUNTER — Telehealth: Payer: Self-pay | Admitting: Gastroenterology

## 2019-03-22 ENCOUNTER — Ambulatory Visit (HOSPITAL_COMMUNITY)
Admission: RE | Admit: 2019-03-22 | Discharge: 2019-03-22 | Disposition: A | Discharge status: Home / Self Care | Payer: Medicare Other | Source: Ambulatory Visit | Attending: Internal Medicine | Admitting: Internal Medicine

## 2019-03-22 ENCOUNTER — Other Ambulatory Visit: Payer: Self-pay

## 2019-03-22 ENCOUNTER — Ambulatory Visit (HOSPITAL_COMMUNITY)
Admission: RE | Admit: 2019-03-22 | Discharge: 2019-03-22 | Disposition: A | Discharge status: Home / Self Care | Payer: Medicare Other | Source: Ambulatory Visit | Attending: Gastroenterology | Admitting: Gastroenterology

## 2019-03-22 DIAGNOSIS — K824 Cholesterolosis of gallbladder: Secondary | ICD-10-CM | POA: Insufficient documentation

## 2019-03-22 DIAGNOSIS — Z1231 Encounter for screening mammogram for malignant neoplasm of breast: Secondary | ICD-10-CM | POA: Diagnosis not present

## 2019-03-22 DIAGNOSIS — K7030 Alcoholic cirrhosis of liver without ascites: Secondary | ICD-10-CM | POA: Diagnosis present

## 2019-03-22 NOTE — Telephone Encounter (Signed)
PLEASE CALL PT. HER U/S SHOWS CIRRHOSIS. REPEAT IN 6 MOS.  

## 2019-03-22 NOTE — Telephone Encounter (Signed)
LMOM to call.

## 2019-03-23 NOTE — Telephone Encounter (Signed)
LMOM to call and mailing a letter to call.

## 2019-03-23 NOTE — Telephone Encounter (Signed)
REMINDER IN EPIC °

## 2019-04-05 ENCOUNTER — Other Ambulatory Visit: Payer: Self-pay | Admitting: Orthopaedic Surgery

## 2019-04-10 ENCOUNTER — Other Ambulatory Visit: Payer: Self-pay

## 2019-04-10 ENCOUNTER — Encounter (HOSPITAL_COMMUNITY): Payer: Self-pay

## 2019-04-10 ENCOUNTER — Inpatient Hospital Stay (HOSPITAL_COMMUNITY)
Admission: EM | Admit: 2019-04-10 | Discharge: 2019-04-13 | DRG: 392 | Disposition: A | Discharge status: Home Health | Payer: Medicare Other | Attending: Family Medicine | Admitting: Family Medicine

## 2019-04-10 ENCOUNTER — Emergency Department (HOSPITAL_COMMUNITY): Payer: Medicare Other

## 2019-04-10 DIAGNOSIS — K529 Noninfective gastroenteritis and colitis, unspecified: Secondary | ICD-10-CM | POA: Diagnosis not present

## 2019-04-10 DIAGNOSIS — Z8711 Personal history of peptic ulcer disease: Secondary | ICD-10-CM

## 2019-04-10 DIAGNOSIS — E878 Other disorders of electrolyte and fluid balance, not elsewhere classified: Secondary | ICD-10-CM | POA: Diagnosis present

## 2019-04-10 DIAGNOSIS — I10 Essential (primary) hypertension: Secondary | ICD-10-CM | POA: Diagnosis present

## 2019-04-10 DIAGNOSIS — R109 Unspecified abdominal pain: Secondary | ICD-10-CM | POA: Diagnosis present

## 2019-04-10 DIAGNOSIS — K7030 Alcoholic cirrhosis of liver without ascites: Secondary | ICD-10-CM | POA: Diagnosis present

## 2019-04-10 DIAGNOSIS — R197 Diarrhea, unspecified: Secondary | ICD-10-CM | POA: Diagnosis not present

## 2019-04-10 DIAGNOSIS — E876 Hypokalemia: Secondary | ICD-10-CM | POA: Diagnosis present

## 2019-04-10 DIAGNOSIS — R1084 Generalized abdominal pain: Secondary | ICD-10-CM

## 2019-04-10 DIAGNOSIS — Z87891 Personal history of nicotine dependence: Secondary | ICD-10-CM

## 2019-04-10 DIAGNOSIS — N179 Acute kidney failure, unspecified: Secondary | ICD-10-CM | POA: Diagnosis present

## 2019-04-10 DIAGNOSIS — Z8379 Family history of other diseases of the digestive system: Secondary | ICD-10-CM

## 2019-04-10 DIAGNOSIS — E860 Dehydration: Secondary | ICD-10-CM | POA: Diagnosis present

## 2019-04-10 DIAGNOSIS — E871 Hypo-osmolality and hyponatremia: Secondary | ICD-10-CM

## 2019-04-10 DIAGNOSIS — R1013 Epigastric pain: Secondary | ICD-10-CM | POA: Diagnosis not present

## 2019-04-10 DIAGNOSIS — G40909 Epilepsy, unspecified, not intractable, without status epilepticus: Secondary | ICD-10-CM | POA: Diagnosis present

## 2019-04-10 DIAGNOSIS — K5909 Other constipation: Secondary | ICD-10-CM | POA: Diagnosis present

## 2019-04-10 DIAGNOSIS — M62441 Contracture of muscle, right hand: Secondary | ICD-10-CM | POA: Diagnosis present

## 2019-04-10 DIAGNOSIS — Z20828 Contact with and (suspected) exposure to other viral communicable diseases: Secondary | ICD-10-CM | POA: Diagnosis present

## 2019-04-10 DIAGNOSIS — R112 Nausea with vomiting, unspecified: Secondary | ICD-10-CM

## 2019-04-10 DIAGNOSIS — K219 Gastro-esophageal reflux disease without esophagitis: Secondary | ICD-10-CM | POA: Diagnosis present

## 2019-04-10 DIAGNOSIS — I69398 Other sequelae of cerebral infarction: Secondary | ICD-10-CM

## 2019-04-10 DIAGNOSIS — K5730 Diverticulosis of large intestine without perforation or abscess without bleeding: Secondary | ICD-10-CM | POA: Diagnosis present

## 2019-04-10 DIAGNOSIS — J45909 Unspecified asthma, uncomplicated: Secondary | ICD-10-CM | POA: Diagnosis present

## 2019-04-10 DIAGNOSIS — Z8673 Personal history of transient ischemic attack (TIA), and cerebral infarction without residual deficits: Secondary | ICD-10-CM

## 2019-04-10 DIAGNOSIS — F1010 Alcohol abuse, uncomplicated: Secondary | ICD-10-CM | POA: Diagnosis present

## 2019-04-10 LAB — COMPREHENSIVE METABOLIC PANEL
ALT: 31 U/L (ref 0–44)
AST: 82 U/L — ABNORMAL HIGH (ref 15–41)
Albumin: 4.4 g/dL (ref 3.5–5.0)
Alkaline Phosphatase: 126 U/L (ref 38–126)
Anion gap: 20 — ABNORMAL HIGH (ref 5–15)
BUN: 21 mg/dL (ref 8–23)
CO2: 30 mmol/L (ref 22–32)
Calcium: 9.5 mg/dL (ref 8.9–10.3)
Chloride: 78 mmol/L — ABNORMAL LOW (ref 98–111)
Creatinine, Ser: 2.01 mg/dL — ABNORMAL HIGH (ref 0.44–1.00)
GFR calc Af Amer: 30 mL/min — ABNORMAL LOW (ref >60)
GFR calc non Af Amer: 26 mL/min — ABNORMAL LOW (ref >60)
Glucose, Bld: 103 mg/dL — ABNORMAL HIGH (ref 70–99)
Potassium: 3 mmol/L — ABNORMAL LOW (ref 3.5–5.1)
Sodium: 128 mmol/L — ABNORMAL LOW (ref 135–145)
Total Bilirubin: 1.7 mg/dL — ABNORMAL HIGH (ref 0.3–1.2)
Total Protein: 9.5 g/dL — ABNORMAL HIGH (ref 6.5–8.1)

## 2019-04-10 LAB — ETHANOL: Alcohol, Ethyl (B): <10 mg/dL (ref <10)

## 2019-04-10 LAB — MAGNESIUM: Magnesium: 2 mg/dL (ref 1.7–2.4)

## 2019-04-10 LAB — CBC
HCT: 35.6 % — ABNORMAL LOW (ref 36.0–46.0)
Hemoglobin: 11.8 g/dL — ABNORMAL LOW (ref 12.0–15.0)
MCH: 31.6 pg (ref 26.0–34.0)
MCHC: 33.1 g/dL (ref 30.0–36.0)
MCV: 95.2 fL (ref 80.0–100.0)
Platelets: 182 10*3/uL (ref 150–400)
RBC: 3.74 MIL/uL — ABNORMAL LOW (ref 3.87–5.11)
RDW: 13.8 % (ref 11.5–15.5)
WBC: 6.5 10*3/uL (ref 4.0–10.5)
nRBC: 0 % (ref 0.0–0.2)

## 2019-04-10 LAB — LIPASE, BLOOD: Lipase: 36 U/L (ref 11–51)

## 2019-04-10 MED ORDER — POTASSIUM CHLORIDE 10 MEQ/100ML IV SOLN: 10.0000 meq | Freq: Once | INTRAVENOUS | Status: DC

## 2019-04-10 MED ORDER — LORAZEPAM 1 MG PO TABS: 1.0000 mg | ORAL_TABLET | Freq: Four times a day (QID) | ORAL | Status: DC | PRN

## 2019-04-10 MED ORDER — LORAZEPAM 2 MG/ML IJ SOLN: 0.0000 mg | Freq: Two times a day (BID) | INTRAMUSCULAR | Status: DC

## 2019-04-10 MED ORDER — POTASSIUM CHLORIDE IN NACL 20-0.9 MEQ/L-% IV SOLN
INTRAVENOUS | Status: DC
  Administered 2019-04-11: 02:00:00 via INTRAVENOUS
  Filled 2019-04-10: qty 1000

## 2019-04-10 MED ORDER — SODIUM CHLORIDE 0.9 % IV BOLUS
1000.0000 mL | Freq: Once | INTRAVENOUS | Status: AC
  Administered 2019-04-10: 1000 mL via INTRAVENOUS

## 2019-04-10 MED ORDER — POTASSIUM CHLORIDE 10 MEQ/100ML IV SOLN
10.0000 meq | INTRAVENOUS | Status: AC
  Administered 2019-04-10 – 2019-04-11 (×2): 10 meq via INTRAVENOUS
  Filled 2019-04-10 (×2): qty 100

## 2019-04-10 MED ORDER — VITAMIN B-1 100 MG PO TABS
100.0000 mg | ORAL_TABLET | Freq: Every day | ORAL | Status: DC
  Administered 2019-04-11 – 2019-04-13 (×3): 100 mg via ORAL
  Filled 2019-04-10 (×4): qty 1

## 2019-04-10 MED ORDER — SODIUM CHLORIDE 0.9% FLUSH: 3.0000 mL | Freq: Once | INTRAVENOUS | Status: DC

## 2019-04-10 MED ORDER — PANTOPRAZOLE SODIUM 40 MG IV SOLR
40.0000 mg | Freq: Two times a day (BID) | INTRAVENOUS | Status: DC
  Administered 2019-04-11 – 2019-04-12 (×4): 40 mg via INTRAVENOUS
  Filled 2019-04-10 (×4): qty 40

## 2019-04-10 MED ORDER — LORAZEPAM 2 MG/ML IJ SOLN
0.0000 mg | Freq: Four times a day (QID) | INTRAMUSCULAR | Status: AC
  Administered 2019-04-11: 1 mg via INTRAVENOUS
  Filled 2019-04-10: qty 1

## 2019-04-10 MED ORDER — SODIUM CHLORIDE 0.9 % IV SOLN: Freq: Once | INTRAVENOUS | Status: DC

## 2019-04-10 MED ORDER — THIAMINE HCL 100 MG/ML IJ SOLN
100.0000 mg | Freq: Every day | INTRAMUSCULAR | Status: DC
  Filled 2019-04-10: qty 2

## 2019-04-10 MED ORDER — ADULT MULTIVITAMIN W/MINERALS CH
1.0000 | ORAL_TABLET | Freq: Every day | ORAL | Status: DC
  Administered 2019-04-11 – 2019-04-13 (×3): 1 via ORAL
  Filled 2019-04-10 (×3): qty 1

## 2019-04-10 MED ORDER — FOLIC ACID 1 MG PO TABS
1.0000 mg | ORAL_TABLET | Freq: Every day | ORAL | Status: DC
  Administered 2019-04-11 – 2019-04-13 (×3): 1 mg via ORAL
  Filled 2019-04-10 (×3): qty 1

## 2019-04-10 MED ORDER — LORAZEPAM 2 MG/ML IJ SOLN: 1.0000 mg | Freq: Four times a day (QID) | INTRAMUSCULAR | Status: DC | PRN

## 2019-04-10 NOTE — ED Notes (Signed)
Okay to do CT without contrast per Dr Roderic PalauFritz Pickerel in [place] made aware

## 2019-04-10 NOTE — ED Provider Notes (Signed)
[facility] EMERGENCY DEPARTMENT Provider Note   CSN: [id number] Arrival date & time: 04/10/19  1608    History   Chief Complaint No chief complaint on file.   HPI [name] is a 63 y.o. female.     HPI   [name] is a 63 y.o. female, with a history of alcohol abuse, asthma, GERD, HTN, presenting to the ED with abdominal pain for the last 3 days.  Pain is mainly in the upper abdomen, cramping, waxing and waning, 7/10, radiating throughout the abdomen.  Accompanied by nausea, multiple episodes of nonbloody nonbilious vomiting, and multiple episodes of diarrhea.  Accompanied by subjective fever.  States she has not had this issue before. Patient states she drinks about 2 beers daily with last alcohol yesterday.  Denies illicit drug use, NSAID use, tobacco use.  Denies chest pain, shortness of breath, cough, urinary symptoms, flank/back pain hematemesis, hematochezia/melena, or any other complaints.    Past Medical History:  Diagnosis Date  . Alcohol abuse   . Asthma   . Contracture of muscle of hand    right  . GERD (gastroesophageal reflux disease)   . HTN (hypertension)   . PUD (peptic ulcer disease)    remote  . Seizures ([place])    since stroke, no recent seizures  . Stroke [facility])    age 28, required brain surgery    Patient Active Problem List   Diagnosis Date Noted  . Acute kidney injury ([place]) 01/22/2019  . History of stroke 01/22/2019  . Asthma 01/22/2019  . Gastroenteritis 01/22/2019  . Stroke ([place]) 06/14/2018  . Cirrhosis of liver without ascites ([place]) 04/21/2017  . AKI (acute kidney injury) ([place]) 07/30/2016  . Anemia 11/25/2015  . Reflux esophagitis   . Sinus tachycardia ([place]) 10/10/2014  . Hypokalemia 10/10/2014  . Seizure disorder ([place]) 10/10/2014  . Alcohol abuse 09/05/2012  . GERD (gastroesophageal reflux disease) 09/05/2012  . FX CLOSED TIBIA NOS 07/27/2007  . STROKE-With also h/o Aneurysm clipping? 07/25/2007  . SEIZURES 07/25/2007     Past Surgical History:  Procedure Laterality Date  . ABDOMINAL HYSTERECTOMY  2004   complete  . BRAIN SURGERY     age 44, stroke  . COLONOSCOPY    . COLONOSCOPY WITH PROPOFOL  10/03/2012   Dr. [name]: moderate diverticulosis, small internal hemorrhoids, TUBULAR ADENOMA. Next colonoscopy 2023 per Dr. [name].  . ESOPHAGOGASTRODUODENOSCOPY N/A 10/18/2014   Dr. Gala Romney during inpatient hospitalization: severe exudative/erosive reflux esophagitis as source of trivial upper GI bleed. No varices   . ESOPHAGOGASTRODUODENOSCOPY (EGD) WITH PROPOFOL  10/03/2012   Dr. [name]: stricture at [place] s/p dilation, mild gastritis negative H.pylori   . ESOPHAGOGASTRODUODENOSCOPY (EGD) WITH PROPOFOL N/A 12/27/2017   Procedure: ESOPHAGOGASTRODUODENOSCOPY (EGD) WITH PROPOFOL;  Surgeon: Danie Binder, MD;  Location: [facility] ENDO SUITE;  Service: Endoscopy;  Laterality: N/A;  10:15am  . right tib-fib fracture  2008  . SAVORY DILATION  10/03/2012   Procedure: SAVORY DILATION;  Surgeon: Danie Binder, MD;  Location: [facility] ORS;  Service: Endoscopy;  Laterality: N/A;  started at 1303,  dilated 12.8-16mm     OB History    Gravida  4   Para  3   Term  3   Preterm      AB  1   Living  3     SAB  1   TAB      Ectopic      Multiple      Live Births  Home Medications    Prior to Admission medications   Medication Sig Start Date End Date Taking? Authorizing Provider  acetaminophen (TYLENOL) 325 MG tablet Take 650 mg by mouth every 6 (six) hours as needed.    [provider]  albuterol (PROVENTIL HFA;VENTOLIN HFA) 108 (90 BASE) MCG/ACT inhaler Inhale 2 puffs into the lungs every 6 (six) hours as needed for wheezing or shortness of breath.     [provider]  ENSURE PLUS (ENSURE PLUS) LIQD Take 237 mLs by mouth 3 (three) times daily between meals.    [provider]  HYDROcodone-acetaminophen (NORCO/VICODIN) 5-325 MG tablet One tablet by mouth every six  hours as needed for pain. 03/20/19   Sanjuana Kava, MD  ipratropium-albuterol (DUONEB) 0.5-2.5 (3) MG/3ML SOLN Take 3 mLs by nebulization every 6 (six) hours as needed (for shortness of breath/wheezing).     [provider]  lidocaine (XYLOCAINE) 2 % solution Using a syringe, 10 mL PO q4h or 30 MINS PRIOR TO MEALS AND PRN FOR ABDOMINAL/CHEST PAIN. NO MORE> 8 DOSES/DAY. 02/14/19   Fields, Marga Melnick, MD  linaclotide [facility]) 145 MCG CAPS capsule 1 PO WITH your first meal Patient taking differently: Take 145 mcg by mouth daily before breakfast. your first meal 06/23/18   Danie Binder, MD  naproxen (NAPROSYN) 500 MG tablet  03/02/19   [provider]  ondansetron (ZOFRAN ODT) 4 MG disintegrating tablet Take 1 tablet (4 mg total) by mouth every 8 (eight) hours as needed for nausea or vomiting. 01/24/19   Kathie Dike, MD  pantoprazole (PROTONIX) 40 MG tablet Take 1 tablet (40 mg total) by mouth every morning. [place] 02/15/19   Fields, Marga Melnick, MD  phenytoin (DILANTIN) 100 MG ER capsule Take 100 mg by mouth 3 (three) times daily.    [provider]    Family History Family History  Problem Relation Age of Onset  . Liver disease Sister        etoh  . Colon cancer Neg Hx     Social History Social History   Tobacco Use  . Smoking status: Former Smoker    Packs/day: 0.04    Years: 9.00    Pack years: 0.36    Types: Cigarettes    Quit date: 07/28/2014    Years since quitting: 4.7  . Smokeless tobacco: Never Used  Substance Use Topics  . Alcohol use: Yes    Alcohol/week: 35.0 standard drinks    Types: 35 Cans of beer per week    Comment: last use 2 weeks ago  . Drug use: No     Allergies   Patient has no known allergies.   Review of Systems Review of Systems  Constitutional: Positive for fever (Subjective).  Respiratory: Negative for cough and shortness of breath.   Cardiovascular: Negative for chest pain and leg swelling.   Gastrointestinal: Positive for abdominal pain, diarrhea, nausea and vomiting. Negative for blood in stool.  Genitourinary: Negative for decreased urine volume, difficulty urinating, dysuria, flank pain, frequency and hematuria.  Musculoskeletal: Negative for back pain.  Neurological: Negative for syncope and weakness.  All other systems reviewed and are negative.    Physical Exam Updated Vital Signs BP (!) 153/77   Pulse 100   Temp 98.3 F (36.8 C) (Oral)   Resp 18   Ht 5\' 4"  (1.626 m)   Wt 64.9 kg   SpO2 98%   BMI 24.55 kg/m   Physical Exam Vitals signs  and nursing note reviewed.  Constitutional:      General: She is not in acute distress.    Appearance: She is well-developed. She is not diaphoretic.  HENT:     Head: Normocephalic and atraumatic.     Mouth/Throat:     Mouth: Mucous membranes are moist.     Pharynx: Oropharynx is clear.  Eyes:     Conjunctiva/sclera: Conjunctivae normal.  Neck:     Musculoskeletal: Neck supple.  Cardiovascular:     Rate and Rhythm: Normal rate and regular rhythm.     Pulses: Normal pulses.          Radial pulses are 2+ on the right side and 2+ on the left side.       Posterior tibial pulses are 2+ on the right side and 2+ on the left side.     Heart sounds: Normal heart sounds.     Comments: Tactile temperature in the extremities appropriate and equal bilaterally. Pulmonary:     Effort: Pulmonary effort is normal. No respiratory distress.     Breath sounds: Normal breath sounds.  Abdominal:     Palpations: Abdomen is soft.     Tenderness: There is generalized abdominal tenderness. There is no guarding.     Comments: Tenderness is generalized, but worse in the epigastric region.  Musculoskeletal:     Right lower leg: No edema.     Left lower leg: No edema.  Lymphadenopathy:     Cervical: No cervical adenopathy.  Skin:    General: Skin is warm and dry.  Neurological:     Mental Status: She is alert.  Psychiatric:        Mood  and Affect: Mood and affect normal.        Speech: Speech normal.        Behavior: Behavior normal.      ED Treatments / Results  Labs (all labs ordered are listed, but only abnormal results are displayed) Labs Reviewed  COMPREHENSIVE METABOLIC PANEL - Abnormal; Notable for the following components:      Result Value   Sodium 128 (*)    Potassium 3.0 (*)    Chloride 78 (*)    Glucose, Bld 103 (*)    Creatinine, Ser 2.01 (*)    Total Protein 9.5 (*)    AST 82 (*)    Total Bilirubin 1.7 (*)    GFR calc non Af Amer 26 (*)    GFR calc Af Amer 30 (*)    Anion gap 20 (*)    All other components within normal limits  CBC - Abnormal; Notable for the following components:   RBC 3.74 (*)    Hemoglobin 11.8 (*)    HCT 35.6 (*)    All other components within normal limits  LIPASE, BLOOD  ETHANOL  MAGNESIUM  URINALYSIS, ROUTINE W REFLEX MICROSCOPIC   Hemoglobin  Date Value Ref Range Status  04/10/2019 11.8 (L) 12.0 - 15.0 g/dL Final  01/23/2019 10.3 (L) 12.0 - 15.0 g/dL Final  01/22/2019 10.5 (L) 12.0 - 15.0 g/dL Final  07/03/2018 10.3 (L) 12.0 - 15.0 g/dL Final    EKG None  Radiology No results found.  Procedures Procedures (including critical care time)  Medications Ordered in ED Medications  sodium chloride flush (NS) 0.9 % injection 3 mL (3 mLs Intravenous Not Given 04/10/19 1957)  sodium chloride 0.9 % bolus 1,000 mL ( Intravenous Rate/Dose Change 04/10/19 2140)    Followed by  0.9 %  sodium chloride infusion ( Intravenous Hold 04/10/19 2144)  potassium chloride 10 mEq in 100 mL IVPB (has no administration in time range)     Initial Impression / Assessment and Plan / ED Course  I have reviewed the triage vital signs and the nursing notes.  Pertinent labs & imaging results that were available during my care of the patient were reviewed by me and considered in my medical decision making (see chart for details).        Patient presents with abdominal pain,  nausea, vomiting, and diarrhea. Patient is nontoxic appearing, afebrile, not tachypneic, not hypotensive, maintains excellent SPO2 on room air, and is in no apparent distress.  Mildly tachycardic at initial presentation, but improved during ED course.  No leukocytosis. Hyponatremia, hypokalemia, hypochloremia noted on CMP, suspected to be due to fluid loss and subsequent dehydration.  AKI and elevated anion gap may simply be due to dehydration.  It is possible patient's increase in total bilirubin could be due to dehydration, however, due to the patient's abdominal pain we will obtain CT scan.  Findings and plan of care discussed with Milton Ferguson, MD. Dr. Roderic Palau personally evaluated and examined this patient. Dr. Roderic Palau also took over care for this patient upon the end of my shift. Plan: CT abdomen/pelvis pending.  Expect patient will likely be admitted.  Vitals:   04/10/19 1612 04/10/19 1958 04/10/19 2000 04/10/19 2142  BP: 117/69 (!) 146/94 (!) 153/77 (!) 157/62  Pulse: (!) 105 (!) 113 100 100  Resp: 18   18  Temp: 98.3 F (36.8 C)     TempSrc: Oral     SpO2: 96% 98% 98% 98%  Weight: 64.9 kg     Height: 5\' 4"  (1.626 m)        Final Clinical Impressions(s) / ED Diagnoses   Final diagnoses:  Generalized abdominal pain  Nausea, vomiting, and diarrhea  Hyponatremia  Hypokalemia    ED Discharge Orders    None       Layla Maw 04/10/19 2207    Milton Ferguson, MD 04/10/19 2242

## 2019-04-10 NOTE — ED Triage Notes (Signed)
Pt is having abdominal pain. Last BM yesterday. Hx of pancreatitis. States nausea and vomiting. Last emesis was 1 hour prior to arrival to ED.

## 2019-04-10 NOTE — ED Notes (Signed)
Patient transported to CT 

## 2019-04-11 ENCOUNTER — Encounter (HOSPITAL_COMMUNITY): Payer: Self-pay | Admitting: Internal Medicine

## 2019-04-11 DIAGNOSIS — E878 Other disorders of electrolyte and fluid balance, not elsewhere classified: Secondary | ICD-10-CM | POA: Diagnosis present

## 2019-04-11 DIAGNOSIS — R112 Nausea with vomiting, unspecified: Secondary | ICD-10-CM

## 2019-04-11 DIAGNOSIS — J45909 Unspecified asthma, uncomplicated: Secondary | ICD-10-CM | POA: Diagnosis present

## 2019-04-11 DIAGNOSIS — R1084 Generalized abdominal pain: Secondary | ICD-10-CM | POA: Diagnosis not present

## 2019-04-11 DIAGNOSIS — E871 Hypo-osmolality and hyponatremia: Secondary | ICD-10-CM | POA: Diagnosis present

## 2019-04-11 DIAGNOSIS — R197 Diarrhea, unspecified: Secondary | ICD-10-CM | POA: Diagnosis present

## 2019-04-11 DIAGNOSIS — K529 Noninfective gastroenteritis and colitis, unspecified: Secondary | ICD-10-CM | POA: Diagnosis present

## 2019-04-11 DIAGNOSIS — N179 Acute kidney failure, unspecified: Secondary | ICD-10-CM | POA: Diagnosis present

## 2019-04-11 DIAGNOSIS — E876 Hypokalemia: Secondary | ICD-10-CM | POA: Diagnosis present

## 2019-04-11 DIAGNOSIS — M62441 Contracture of muscle, right hand: Secondary | ICD-10-CM | POA: Diagnosis present

## 2019-04-11 DIAGNOSIS — Z20828 Contact with and (suspected) exposure to other viral communicable diseases: Secondary | ICD-10-CM | POA: Diagnosis present

## 2019-04-11 DIAGNOSIS — K7030 Alcoholic cirrhosis of liver without ascites: Secondary | ICD-10-CM | POA: Diagnosis present

## 2019-04-11 DIAGNOSIS — R1013 Epigastric pain: Secondary | ICD-10-CM

## 2019-04-11 DIAGNOSIS — Z8711 Personal history of peptic ulcer disease: Secondary | ICD-10-CM | POA: Diagnosis not present

## 2019-04-11 DIAGNOSIS — Z8379 Family history of other diseases of the digestive system: Secondary | ICD-10-CM | POA: Diagnosis not present

## 2019-04-11 DIAGNOSIS — Z87891 Personal history of nicotine dependence: Secondary | ICD-10-CM | POA: Diagnosis not present

## 2019-04-11 DIAGNOSIS — I69398 Other sequelae of cerebral infarction: Secondary | ICD-10-CM | POA: Diagnosis not present

## 2019-04-11 DIAGNOSIS — K219 Gastro-esophageal reflux disease without esophagitis: Secondary | ICD-10-CM | POA: Diagnosis present

## 2019-04-11 DIAGNOSIS — F1010 Alcohol abuse, uncomplicated: Secondary | ICD-10-CM | POA: Diagnosis present

## 2019-04-11 DIAGNOSIS — K5909 Other constipation: Secondary | ICD-10-CM | POA: Diagnosis present

## 2019-04-11 DIAGNOSIS — E860 Dehydration: Secondary | ICD-10-CM | POA: Diagnosis present

## 2019-04-11 DIAGNOSIS — G40909 Epilepsy, unspecified, not intractable, without status epilepticus: Secondary | ICD-10-CM | POA: Diagnosis present

## 2019-04-11 DIAGNOSIS — Z8673 Personal history of transient ischemic attack (TIA), and cerebral infarction without residual deficits: Secondary | ICD-10-CM | POA: Diagnosis not present

## 2019-04-11 DIAGNOSIS — K5730 Diverticulosis of large intestine without perforation or abscess without bleeding: Secondary | ICD-10-CM | POA: Diagnosis present

## 2019-04-11 LAB — COMPREHENSIVE METABOLIC PANEL
ALT: 24 U/L (ref 0–44)
AST: 54 U/L — ABNORMAL HIGH (ref 15–41)
Albumin: 3.3 g/dL — ABNORMAL LOW (ref 3.5–5.0)
Alkaline Phosphatase: 107 U/L (ref 38–126)
Anion gap: 14 (ref 5–15)
BUN: 17 mg/dL (ref 8–23)
CO2: 29 mmol/L (ref 22–32)
Calcium: 8.9 mg/dL (ref 8.9–10.3)
Chloride: 89 mmol/L — ABNORMAL LOW (ref 98–111)
Creatinine, Ser: 1.1 mg/dL — ABNORMAL HIGH (ref 0.44–1.00)
GFR calc Af Amer: >60 mL/min (ref >60)
GFR calc non Af Amer: 54 mL/min — ABNORMAL LOW (ref >60)
Glucose, Bld: 101 mg/dL — ABNORMAL HIGH (ref 70–99)
Potassium: 4.1 mmol/L (ref 3.5–5.1)
Sodium: 132 mmol/L — ABNORMAL LOW (ref 135–145)
Total Bilirubin: 1.6 mg/dL — ABNORMAL HIGH (ref 0.3–1.2)
Total Protein: 7.3 g/dL (ref 6.5–8.1)

## 2019-04-11 LAB — CBC
HCT: 31.7 % — ABNORMAL LOW (ref 36.0–46.0)
Hemoglobin: 10.5 g/dL — ABNORMAL LOW (ref 12.0–15.0)
MCH: 31.7 pg (ref 26.0–34.0)
MCHC: 33.1 g/dL (ref 30.0–36.0)
MCV: 95.8 fL (ref 80.0–100.0)
Platelets: 122 10*3/uL — ABNORMAL LOW (ref 150–400)
RBC: 3.31 MIL/uL — ABNORMAL LOW (ref 3.87–5.11)
RDW: 13.5 % (ref 11.5–15.5)
WBC: 3.7 10*3/uL — ABNORMAL LOW (ref 4.0–10.5)
nRBC: 0 % (ref 0.0–0.2)

## 2019-04-11 LAB — URINALYSIS, ROUTINE W REFLEX MICROSCOPIC
Bilirubin Urine: NEGATIVE
Glucose, UA: NEGATIVE mg/dL
Hgb urine dipstick: NEGATIVE
Ketones, ur: NEGATIVE mg/dL
Leukocytes,Ua: NEGATIVE
Nitrite: NEGATIVE
Protein, ur: NEGATIVE mg/dL
Specific Gravity, Urine: 1.004 — ABNORMAL LOW (ref 1.005–1.030)
pH: 7 (ref 5.0–8.0)

## 2019-04-11 LAB — C DIFFICILE QUICK SCREEN W PCR REFLEX
C Diff antigen: NEGATIVE
C Diff interpretation: NOT DETECTED
C Diff toxin: NEGATIVE

## 2019-04-11 LAB — PROTIME-INR
INR: 1.1 (ref 0.8–1.2)
Prothrombin Time: 14.3 seconds (ref 11.4–15.2)

## 2019-04-11 LAB — SARS CORONAVIRUS 2 BY RT PCR (HOSPITAL ORDER, PERFORMED IN ~~LOC~~ HOSPITAL LAB): SARS Coronavirus 2: NEGATIVE

## 2019-04-11 MED ORDER — PHENYTOIN SODIUM EXTENDED 100 MG PO CAPS
100.0000 mg | ORAL_CAPSULE | Freq: Three times a day (TID) | ORAL | Status: DC
  Administered 2019-04-11 – 2019-04-13 (×7): 100 mg via ORAL
  Filled 2019-04-11 (×7): qty 1

## 2019-04-11 MED ORDER — BOOST / RESOURCE BREEZE PO LIQD CUSTOM
1.0000 | Freq: Three times a day (TID) | ORAL | Status: AC
  Administered 2019-04-11 (×3): 1 via ORAL

## 2019-04-11 MED ORDER — ENSURE ENLIVE PO LIQD
237.0000 mL | Freq: Two times a day (BID) | ORAL | Status: DC
  Administered 2019-04-12: 237 mL via ORAL

## 2019-04-11 MED ORDER — ALBUTEROL SULFATE HFA 108 (90 BASE) MCG/ACT IN AERS
2.0000 | INHALATION_SPRAY | Freq: Four times a day (QID) | RESPIRATORY_TRACT | Status: DC | PRN
  Filled 2019-04-11: qty 6.7

## 2019-04-11 MED ORDER — ENSURE ENLIVE PO LIQD
237.0000 mL | Freq: Three times a day (TID) | ORAL | Status: DC
  Administered 2019-04-11 (×2): 237 mL via ORAL

## 2019-04-11 MED ORDER — HYDRALAZINE HCL 20 MG/ML IJ SOLN: 10.0000 mg | Freq: Four times a day (QID) | INTRAMUSCULAR | Status: DC | PRN

## 2019-04-11 MED ORDER — ALBUTEROL SULFATE (2.5 MG/3ML) 0.083% IN NEBU: 2.5000 mg | INHALATION_SOLUTION | Freq: Four times a day (QID) | RESPIRATORY_TRACT | Status: DC | PRN

## 2019-04-11 MED ORDER — POTASSIUM CHLORIDE IN NACL 20-0.9 MEQ/L-% IV SOLN
INTRAVENOUS | Status: DC
  Administered 2019-04-11 (×2): via INTRAVENOUS

## 2019-04-11 NOTE — Progress Notes (Signed)
Initial Nutrition Assessment  DOCUMENTATION CODES:  Not applicable  INTERVENTION:  Continue Boost Breeze po TID for another 24 hrs while on CL diet, each supplement provides 250 kcal and 9 grams of protein  Once diet advanced to Full liquid, transition to Ensure Enlive po BID, each supplement provides 350 kcal and 20 grams of protein  NUTRITION DIAGNOSIS:  Inadequate oral intake related to nausea, vomiting, diarrhea(acute illness) as evidenced by per patient/family report.  GOAL:  Patient will meet greater than or equal to 90% of their needs  MONITOR:  Labs, I & O's, Diet advancement, Supplement acceptance, PO intake, Weight trends  REASON FOR ASSESSMENT:  Malnutrition Screening Tool    ASSESSMENT:  63 y/o female PMHx etoh abuse, alcoholic cirrhosis, GERD, HTN, PUD, CVA w/ chronically contracted RUE. Presents with c/p epigastric discomfort x3 days w/ associated n/v/d. She has chronic diarrhea (3-4 liquid stools/day), but has been slightly worse recently. In ED, found to be clinically dehydrated and admitted for rehydration and further evaluation.   RD operating remotely d/t covid precautions. Pt screened positive on MST w/ score of 3. Reported decreased intake r/t poor appetite and being unsure as to whether or not she had lost weight   RD attempted to reach pt by phone, but was unsuccessful. Per chart, patient had reported n/v and epigastric pain x3 days. She did not specifically mention if this prohibited oral intake or was accompanied oral intake, though likely reasonable to believe intake has been below baseline x4 days (today being fourth day).    Per chart, pt was weighed on bed scale today at 159 lbs. Per chart, pt was also admitted in early April for dehydration 2/2 n/v. She was d/cd that admission at a weight of ~164 lbs. As such, pt appears to have suffered modest wt loss.  Long term, pt appears to exhibit wt gain. She was 125-135 lbs 4-5 years ago and had been ~140-150  2017-2018. In 2019, she was 160-170 lbs.  She was maintaining her weight at 145-150.   It is unclear as to whether or not pts etoh abuse is in remission or not. EDP note states pt still drinks 2 beers daily, with last drink being 6/22, however admitting hospitalist states the pt has not drank recently and GI note says the patient is unable to drink alcohol due to the n/v it causes. Regardless, pt is ordered appropriate vitamins/minerals for possible etoh abuse.   At this time, pt is restricted to CL diet. Will continue Boost Breeze as she seems to be accepting this. Will transition to Ensure enlive  tomorrow, as her diet will hopefull be advanced by then.   Labs: Na: 128-> 132, K: 3.0-> 4.1, BUN/Creat:21/2.01-> 17/1.10, Albumin: 4.4->3.3 Meds: Ensure Enlive TID (accepting?), resource breeze tid (accepting), folate, mvi with min, folate, thiamin, dilantin, ppi, IVF w/ Kcl  Recent Labs  Lab 04/10/19 1845 04/11/19 0841  NA 128* 132*  K 3.0* 4.1  CL 78* 89*  CO2 30 29  BUN 21 17  CREATININE 2.01* 1.10*  CALCIUM 9.5 8.9  MG 2.0  --   GLUCOSE 103* 101*   NUTRITION - FOCUSED PHYSICAL EXAM: Unable to conduct  Diet Order:   Diet Order            Diet clear liquid Room service appropriate? Yes; Fluid consistency: Thin  Diet effective now             EDUCATION NEEDS:  No education needs have been identified at this time  Skin:  Skin Assessment: Reviewed RN Assessment  Last BM:  Unknown/ PTA  Height:  Ht Readings from Last 1 Encounters:  04/11/19 5\' 4"  (1.626 m)   Weight:  Wt Readings from Last 1 Encounters:  04/11/19 72.1 kg   Wt Readings from Last 10 Encounters:  04/11/19 72.1 kg  01/24/19 74.3 kg  09/06/18 77.1 kg  06/21/18 73.8 kg  06/14/18 74.4 kg  03/14/18 74.8 kg  12/20/17 67.1 kg  11/23/17 67.6 kg  11/15/17 67.6 kg  11/01/17 66.7 kg   Ideal Body Weight:  54.54 kg  BMI:  Body mass index is 27.28 kg/m.  Estimated Nutritional Needs:  Kcal:  1800-1950  (25-27 kcal/kg bw) Protein:  87-101g Pro (1.2-1.4 /kg bw) Fluid:  1.8-2L (13ml/kcal)  Burtis Junes RD, LDN, CNSC Clinical Nutrition Available Tues-Sat via Pager: [phone] 04/11/2019 5:13 PM

## 2019-04-11 NOTE — Progress Notes (Signed)
PROGRESS NOTE    [name]  IWO:[id number]  DOB: 08-13-1956  DOA: 04/10/2019 PCP: [name], MD   Brief Admission Hx: 63 year old female with history of alcohol abuse, chronic liver disease, GERD, PUD, history of CVA with persistent right arm contracture, asthma presented with epigastric abdominal pain for 3 days prior to admission associated with 3-4 liquid stools per day which is more than her chronic diarrhea symptoms.  MDM/Assessment & Plan:   1. Dehydration secondary to worsening diarrhea symptoms- she is responding well to IV fluid hydration which we will continue.  Her renal function is improving with hydration. 2. Hyponatremia-secondary to dehydration-improving with IV fluids which we will continue.  Follow CMP. 3. Epigastric abdominal pain- CT of the abdomen did not reveal any acute findings.  Clinically she seems to be slowly improving with supportive therapy.  Continue IV Protonix.  GI consult appreciated. 4. Acute on chronic diarrhea- fortunately she has had no bowel movement since admission.  Stool studies are ordered if she does have a bout of diarrhea.   Hypokalemia- this is been repleted we will continue to follow closely with magnesium. 5. Acute renal failure-secondary to dehydration-creatinine improving with IV fluid hydration. 6. Epilepsy/cerebrovascular disease with history of stroke-continue Dilantin 100 mg 3 times daily.  Check Dilantin level. 7. History of asthma- stable and controlled with current HFA treatment. 8. Alcohol abuse-CIWA protocol in place. 9. Chronic constipation-hold Linzess as she is reporting diarrhea at this time.  DVT prophylaxis: Lovenox/SCD Code Status: Full Family Communication:  Disposition Plan: Continue inpatient treatments   Consultants:  GI  Procedures:    Antimicrobials:     Subjective: Patient says that she still has abdominal pain but has not had diarrhea since admission  Objective: Vitals:   04/11/19  0200 04/11/19 0300 04/11/19 0330 04/11/19 0535  BP: (!) 144/58 (!) 143/65 138/64 (!) 144/71  Pulse: 97 92 90 93  Resp: 17 17 17 18   Temp:    98.2 F (36.8 C)  TempSrc:    Oral  SpO2: 100% 99% 97% 99%  Weight:    72.1 kg  Height:    5\' 4"  (1.626 m)    Intake/Output Summary (Last 24 hours) at 04/11/2019 1237 Last data filed at 04/11/2019 0833 Gross per 24 hour  Intake 1832.95 ml  Output -  Net 1832.95 ml   Filed Weights   04/10/19 1612 04/11/19 0535  Weight: 64.9 kg 72.1 kg     REVIEW OF SYSTEMS  As per history otherwise all reviewed and reported negative  Exam:  General exam: Chronically ill-appearing female lying in the bed she is awake and alert with mild distress. Respiratory system: Clear. No increased work of breathing. Cardiovascular system: S1 & S2 heard. No JVD, murmurs, gallops, clicks or pedal edema. Gastrointestinal system: Abdomen is nondistended, soft and with diffuse nonspecific tenderness no guarding and no rebound tenderness. Normal bowel sounds heard. Central nervous system: Alert and oriented. No focal neurological deficits. Extremities: no CCE.  Data Reviewed: Basic Metabolic Panel: Recent Labs  Lab 04/10/19 1845 04/11/19 0841  NA 128* 132*  K 3.0* 4.1  CL 78* 89*  CO2 30 29  GLUCOSE 103* 101*  BUN 21 17  CREATININE 2.01* 1.10*  CALCIUM 9.5 8.9  MG 2.0  --    Liver Function Tests: Recent Labs  Lab 04/10/19 1845 04/11/19 0841  AST 82* 54*  ALT 31 24  ALKPHOS 126 107  BILITOT 1.7* 1.6*  PROT 9.5* 7.3  ALBUMIN 4.4 3.3*  Recent Labs  Lab 04/10/19 1845  LIPASE 36   No results for input(s): AMMONIA in the last 168 hours. CBC: Recent Labs  Lab 04/10/19 1845 04/11/19 0841  WBC 6.5 3.7*  HGB 11.8* 10.5*  HCT 35.6* 31.7*  MCV 95.2 95.8  PLT 182 122*   Cardiac Enzymes: No results for input(s): CKTOTAL, CKMB, CKMBINDEX, TROPONINI in the last 168 hours. CBG (last 3)  No results for input(s): GLUCAP in the last 72 hours. Recent  Results (from the past 240 hour(s))  SARS Coronavirus 2 (CEPHEID - Performed in [place] hospital lab), Hosp Order     Status: None   Collection Time: 04/11/19  1:44 AM   Specimen: Nasopharyngeal Swab  Result Value Ref Range Status   SARS Coronavirus 2 NEGATIVE NEGATIVE Final    Comment: (NOTE) If result is NEGATIVE SARS-CoV-2 target nucleic acids are NOT DETECTED. The SARS-CoV-2 RNA is generally detectable in upper and lower  respiratory specimens during the acute phase of infection. The lowest  concentration of SARS-CoV-2 viral copies this assay can detect is 250  copies / mL. A negative result does not preclude SARS-CoV-2 infection  and should not be used as the sole basis for treatment or other  patient management decisions.  A negative result may occur with  improper specimen collection / handling, submission of specimen other  than nasopharyngeal swab, presence of viral mutation(s) within the  areas targeted by this assay, and inadequate number of viral copies  (<250 copies / mL). A negative result must be combined with clinical  observations, patient history, and epidemiological information. If result is POSITIVE SARS-CoV-2 target nucleic acids are DETECTED. The SARS-CoV-2 RNA is generally detectable in upper and lower  respiratory specimens dur ing the acute phase of infection.  Positive  results are indicative of active infection with SARS-CoV-2.  Clinical  correlation with patient history and other diagnostic information is  necessary to determine patient infection status.  Positive results do  not rule out bacterial infection or co-infection with other viruses. If result is PRESUMPTIVE POSTIVE SARS-CoV-2 nucleic acids MAY BE PRESENT.   A presumptive positive result was obtained on the submitted specimen  and confirmed on repeat testing.  While 2019 novel coronavirus  (SARS-CoV-2) nucleic acids may be present in the submitted sample  additional confirmatory testing may  be necessary for epidemiological  and / or clinical management purposes  to differentiate between  SARS-CoV-2 and other Sarbecovirus currently known to infect humans.  If clinically indicated additional testing with an alternate test  methodology [phone]) is advised. The SARS-CoV-2 RNA is generally  detectable in upper and lower respiratory sp ecimens during the acute  phase of infection. The expected result is Negative. Fact Sheet for Patients:  StrictlyIdeas.no Fact Sheet for Healthcare Providers: BankingDealers.co.za This test is not yet approved or cleared by the [place] FDA and has been authorized for detection and/or diagnosis of SARS-CoV-2 by FDA under an Emergency Use Authorization (EUA).  This EUA will remain in effect (meaning this test can be used) for the duration of the COVID-19 declaration under Section 564(b)(1) of the Act, 21 U.S.C. section 360bbb-3(b)(1), unless the authorization is terminated or revoked sooner. Performed at [facility], [address] [place], [place] [address]      Studies: Ct Abdomen Pelvis Wo Contrast  Result Date: 04/10/2019 CLINICAL DATA:  Abdominal pain.  History of pancreatitis. EXAM: CT ABDOMEN AND PELVIS WITHOUT CONTRAST TECHNIQUE: Multidetector CT imaging of the abdomen and pelvis was performed  following the standard protocol without IV contrast. COMPARISON:  CT dated January 24, 2019. FINDINGS: Lower chest: No acute abnormality. Hepatobiliary: There is evidence of cirrhosis without a discrete hepatic mass, however evaluation is severely limited by lack of IV contrast. The gallbladder is unremarkable. Pancreas: Unremarkable. No pancreatic ductal dilatation or surrounding inflammatory changes. Spleen: Normal in size without focal abnormality. Adrenals/Urinary Tract: Adrenal glands are unremarkable. Kidneys are normal, without renal calculi, focal lesion, or hydronephrosis. Bladder is unremarkable.  Stomach/Bowel: There is sigmoid diverticulosis without CT evidence of diverticulitis. The stomach is unremarkable aside from a small hiatal hernia. There is no evidence of a small-bowel obstruction. There is a normal appearing appendix in the right lower quadrant. Vascular/Lymphatic: Aortic atherosclerosis. No enlarged abdominal or pelvic lymph nodes. Reproductive: Status post hysterectomy. No adnexal masses. Other: No abdominal wall hernia or abnormality. No abdominopelvic ascites. Musculoskeletal: No fracture is seen. IMPRESSION: 1. No acute intra-abdominal abnormality. 2. Cirrhosis. 3. Sigmoid diverticulosis without CT evidence of diverticulitis. 4. Normal appendix in the right lower quadrant.  No hydronephrosis. Electronically Signed   By: Constance Holster M.D.   On: 04/10/2019 22:56     Scheduled Meds: . feeding supplement  1 Container Oral TID BM  . feeding supplement (ENSURE ENLIVE)  237 mL Oral TID BM  . folic acid  1 mg Oral Daily  . LORazepam  0-4 mg Intravenous Q6H   Followed by  . [START ON 04/12/2019] LORazepam  0-4 mg Intravenous Q12H  . multivitamin with minerals  1 tablet Oral Daily  . pantoprazole (PROTONIX) IV  40 mg Intravenous Q12H  . phenytoin  100 mg Oral TID  . sodium chloride flush  3 mL Intravenous Once  . thiamine  100 mg Oral Daily   Or  . thiamine  100 mg Intravenous Daily   Continuous Infusions: . sodium chloride Stopped (04/10/19 2144)  . 0.9 % NaCl with KCl 20 mEq / L 75 mL/hr at 04/11/19 0200  . potassium chloride      Principal Problem:   ARF (acute renal failure) (HCC) Active Problems:   GERD (gastroesophageal reflux disease)   Abdominal pain   Hypokalemia   History of stroke   Diarrhea   Nausea, vomiting, and diarrhea   Time spent:   Irwin Brakeman, MD [facility] 04/11/2019, 12:37 PM    LOS: 0 days  How to contact the [facility] Attending or Consulting provider [place] or covering provider during after hours [place], for this patient?   1. Check the care team in [facility] and look for a) attending/consulting [facility] provider listed and b) the [facility] team listed 2. Log into [email or web address] and use [place]'s universal password to access. If you do not have the password, please contact the hospital operator. 3. Locate the [facility] provider you are looking for under [facility] and page to a number that you can be directly reached. 4. If you still have difficulty reaching the provider, please page the [facility] (Director on Call) for the Hospitalists listed on amion for assistance.

## 2019-04-11 NOTE — ED Notes (Signed)
ED TO INPATIENT HANDOFF REPORT  ED Nurse Name and Phone #: Rosalie Gums, RN  S Name/Age/Gender [name] 63 y.o. female Room/Bed: APA04/APA04  Code Status   Code Status: Full Code  Home/SNF/Other Home Patient oriented to: self, place, time and situation Is this baseline? Yes   Triage Complete: Triage complete  Chief Complaint Abd pain   Triage Note Pt is having abdominal pain. Last BM yesterday. Hx of pancreatitis. States nausea and vomiting. Last emesis was 1 hour prior to arrival to ED.    Allergies No Known Allergies  Level of Care/Admitting Diagnosis ED Disposition    ED Disposition Condition Comment   Admit  Hospital Area: [facility] [[id number]]  Level of Care: Med-Surg [16]  Covid Evaluation: Screening Protocol (No Symptoms)  Diagnosis: ARF (acute renal failure) [facility]) [086578]  Admitting Physician: Jani Gravel [[id number]]  Attending Physician: Jani Gravel [[id number]]  PT Class (Do Not Modify): Observation [104]  PT Acc Code (Do Not Modify): Observation [10022]       B Medical/Surgery History Past Medical History:  Diagnosis Date  . Alcohol abuse   . Asthma   . Contracture of muscle of hand    right  . GERD (gastroesophageal reflux disease)   . HTN (hypertension)   . PUD (peptic ulcer disease)    remote  . Seizures ([place])    since stroke, no recent seizures  . Stroke [facility])    age 29, required brain surgery   Past Surgical History:  Procedure Laterality Date  . ABDOMINAL HYSTERECTOMY  2004   complete  . BRAIN SURGERY     age 3, stroke  . COLONOSCOPY    . COLONOSCOPY WITH PROPOFOL  10/03/2012   Dr. [name]: moderate diverticulosis, small internal hemorrhoids, TUBULAR ADENOMA. Next colonoscopy 2023 per Dr. [name].  . ESOPHAGOGASTRODUODENOSCOPY N/A 10/18/2014   Dr. Gala Romney during inpatient hospitalization: severe exudative/erosive reflux esophagitis as source of trivial upper GI bleed. No varices   . ESOPHAGOGASTRODUODENOSCOPY (EGD) WITH PROPOFOL   10/03/2012   Dr. [name]: stricture at [place] junction s/p dilation, mild gastritis negative H.pylori   . ESOPHAGOGASTRODUODENOSCOPY (EGD) WITH PROPOFOL N/A 12/27/2017   Procedure: ESOPHAGOGASTRODUODENOSCOPY (EGD) WITH PROPOFOL;  Surgeon: Danie Binder, MD;  Location: [facility];  Service: Endoscopy;  Laterality: N/A;  10:15am  . right tib-fib fracture  2008  . SAVORY DILATION  10/03/2012   Procedure: SAVORY DILATION;  Surgeon: Danie Binder, MD;  Location: [facility] ORS;  Service: Endoscopy;  Laterality: N/A;  started at 1303,  dilated 12.8-16mm     A IV Location/Drains/Wounds Patient Lines/Drains/Airways Status   Active Line/Drains/Airways    Name:   Placement date:   Placement time:   Site:   Days:   Peripheral IV 04/10/19 Left Hand   04/10/19    2123    Hand   1   External Urinary Catheter   04/10/19    2345    -   1          Intake/Output Last 24 hours  Intake/Output Summary (Last 24 hours) at 04/11/2019 0327 Last data filed at 04/11/2019 0144 Gross per 24 hour  Intake 1189.72 ml  Output -  Net 1189.72 ml    Labs/Imaging Results for orders placed or performed during the hospital encounter of 04/10/19 (from the past 48 hour(s))  Lipase, blood     Status: None   Collection Time: 04/10/19  6:45 PM  Result Value Ref Range   Lipase 36 11 - 51  U/L    Comment: Performed at [facility], [address] [place], [place] [address]  Comprehensive metabolic panel     Status: Abnormal   Collection Time: 04/10/19  6:45 PM  Result Value Ref Range   Sodium 128 (L) 135 - 145 mmol/L   Potassium 3.0 (L) 3.5 - 5.1 mmol/L   Chloride 78 (L) 98 - 111 mmol/L   CO2 30 22 - 32 mmol/L   Glucose, Bld 103 (H) 70 - 99 mg/dL   BUN 21 8 - 23 mg/dL   Creatinine, Ser 2.01 (H) 0.44 - 1.00 mg/dL   Calcium 9.5 8.9 - 10.3 mg/dL   Total Protein 9.5 (H) 6.5 - 8.1 g/dL   Albumin 4.4 3.5 - 5.0 g/dL   AST 82 (H) 15 - 41 U/L   ALT 31 0 - 44 U/L   Alkaline Phosphatase 126 38 - 126 U/L   Total Bilirubin 1.7 (H)  0.3 - 1.2 mg/dL   GFR calc non Af Amer 26 (L) >60 mL/min   GFR calc Af Amer 30 (L) >60 mL/min   Anion gap 20 (H) 5 - 15    Comment: Performed at [facility], [address] [place], [place] [address]  CBC     Status: Abnormal   Collection Time: 04/10/19  6:45 PM  Result Value Ref Range   WBC 6.5 4.0 - 10.5 K/uL   RBC 3.74 (L) 3.87 - 5.11 MIL/uL   Hemoglobin 11.8 (L) 12.0 - 15.0 g/dL   HCT 35.6 (L) 36.0 - 46.0 %   MCV 95.2 80.0 - 100.0 fL   MCH 31.6 26.0 - 34.0 pg   MCHC 33.1 30.0 - 36.0 g/dL   RDW 13.8 11.5 - 15.5 %   Platelets 182 150 - 400 K/uL   nRBC 0.0 0.0 - 0.2 %    Comment: Performed at [facility], [address] [place], [place] [address]  Ethanol     Status: None   Collection Time: 04/10/19  6:45 PM  Result Value Ref Range   Alcohol, Ethyl (B) <10 <10 mg/dL    Comment: (NOTE) Lowest detectable limit for serum alcohol is 10 mg/dL. For medical purposes only. Performed at [facility], [address], [place], [place] [address]   Magnesium     Status: None   Collection Time: 04/10/19  6:45 PM  Result Value Ref Range   Magnesium 2.0 1.7 - 2.4 mg/dL    Comment: Performed at [facility], [address] [place], [place] [address]  SARS Coronavirus 2 (CEPHEID - Performed in [place] hospital lab), Hosp Order     Status: None   Collection Time: 04/11/19  1:44 AM   Specimen: Nasopharyngeal Swab  Result Value Ref Range   SARS Coronavirus 2 NEGATIVE NEGATIVE    Comment: (NOTE) If result is NEGATIVE SARS-CoV-2 target nucleic acids are NOT DETECTED. The SARS-CoV-2 RNA is generally detectable in upper and lower  respiratory specimens during the acute phase of infection. The lowest  concentration of SARS-CoV-2 viral copies this assay can detect is 250  copies / mL. A negative result does not preclude SARS-CoV-2 infection  and should not be used as the sole basis for treatment or other  patient management decisions.  A negative result may occur with  improper specimen  collection / handling, submission of specimen other  than nasopharyngeal swab, presence of viral mutation(s) within the  areas targeted by this assay, and inadequate number of viral copies  (<250 copies /  mL). A negative result must be combined with clinical  observations, patient history, and epidemiological information. If result is POSITIVE SARS-CoV-2 target nucleic acids are DETECTED. The SARS-CoV-2 RNA is generally detectable in upper and lower  respiratory specimens dur ing the acute phase of infection.  Positive  results are indicative of active infection with SARS-CoV-2.  Clinical  correlation with patient history and other diagnostic information is  necessary to determine patient infection status.  Positive results do  not rule out bacterial infection or co-infection with other viruses. If result is PRESUMPTIVE POSTIVE SARS-CoV-2 nucleic acids MAY BE PRESENT.   A presumptive positive result was obtained on the submitted specimen  and confirmed on repeat testing.  While 2019 novel coronavirus  (SARS-CoV-2) nucleic acids may be present in the submitted sample  additional confirmatory testing may be necessary for epidemiological  and / or clinical management purposes  to differentiate between  SARS-CoV-2 and other Sarbecovirus currently known to infect humans.  If clinically indicated additional testing with an alternate test  methodology [phone]) is advised. The SARS-CoV-2 RNA is generally  detectable in upper and lower respiratory sp ecimens during the acute  phase of infection. The expected result is Negative. Fact Sheet for Patients:  StrictlyIdeas.no Fact Sheet for Healthcare Providers: BankingDealers.co.za This test is not yet approved or cleared by the [place] FDA and has been authorized for detection and/or diagnosis of SARS-CoV-2 by FDA under an Emergency Use Authorization (EUA).  This EUA will remain in effect  (meaning this test can be used) for the duration of the COVID-19 declaration under Section 564(b)(1) of the Act, 21 U.S.C. section 360bbb-3(b)(1), unless the authorization is terminated or revoked sooner. Performed at [facility], [address], [place], [place] [address]    Ct Abdomen Pelvis Wo Contrast  Result Date: 04/10/2019 CLINICAL DATA:  Abdominal pain.  History of pancreatitis. EXAM: CT ABDOMEN AND PELVIS WITHOUT CONTRAST TECHNIQUE: Multidetector CT imaging of the abdomen and pelvis was performed following the standard protocol without IV contrast. COMPARISON:  CT dated January 24, 2019. FINDINGS: Lower chest: No acute abnormality. Hepatobiliary: There is evidence of cirrhosis without a discrete hepatic mass, however evaluation is severely limited by lack of IV contrast. The gallbladder is unremarkable. Pancreas: Unremarkable. No pancreatic ductal dilatation or surrounding inflammatory changes. Spleen: Normal in size without focal abnormality. Adrenals/Urinary Tract: Adrenal glands are unremarkable. Kidneys are normal, without renal calculi, focal lesion, or hydronephrosis. Bladder is unremarkable. Stomach/Bowel: There is sigmoid diverticulosis without CT evidence of diverticulitis. The stomach is unremarkable aside from a small hiatal hernia. There is no evidence of a small-bowel obstruction. There is a normal appearing appendix in the right lower quadrant. Vascular/Lymphatic: Aortic atherosclerosis. No enlarged abdominal or pelvic lymph nodes. Reproductive: Status post hysterectomy. No adnexal masses. Other: No abdominal wall hernia or abnormality. No abdominopelvic ascites. Musculoskeletal: No fracture is seen. IMPRESSION: 1. No acute intra-abdominal abnormality. 2. Cirrhosis. 3. Sigmoid diverticulosis without CT evidence of diverticulitis. 4. Normal appendix in the right lower quadrant.  No hydronephrosis. Electronically Signed   By: Constance Holster M.D.   On: 04/10/2019 22:56    Pending  Labs Unresulted Labs (From admission, onward)    Start     Ordered   04/11/19 0500  Comprehensive metabolic panel  Tomorrow morning,   R     04/10/19 2300   04/11/19 0500  CBC  Tomorrow morning,   R     04/10/19 2300   04/11/19 0500  Hepatitis panel, acute  Tomorrow  morning,   R     04/10/19 2300   04/11/19 0104  Gastrointestinal Panel by PCR , Stool  (Gastrointestinal Panel by PCR, Stool)  Once,   STAT     04/11/19 0103   04/11/19 0104  C difficile quick scan w PCR reflex  (C Difficile quick screen w PCR reflex panel)  Once, for 24 hours,   STAT     04/11/19 0103   04/10/19 1614  Urinalysis, Routine w reflex microscopic  ONCE - STAT,   STAT     04/10/19 1613          Vitals/Pain Today's Vitals   04/11/19 0130 04/11/19 0200 04/11/19 0201 04/11/19 0300  BP: (!) 146/81 (!) 144/58  (!) 143/65  Pulse: 99 97  92  Resp: 17 17  17   Temp:      TempSrc:      SpO2: 99% 100%  99%  Weight:      Height:      PainSc:   0-No pain     Isolation Precautions Enteric precautions (UV disinfection)  Medications Medications  sodium chloride flush (NS) 0.9 % injection 3 mL (3 mLs Intravenous Not Given 04/10/19 1957)  sodium chloride 0.9 % bolus 1,000 mL (0 mLs Intravenous Stopped 04/11/19 0144)    Followed by  0.9 %  sodium chloride infusion ( Intravenous Hold 04/10/19 2144)  potassium chloride 10 mEq in 100 mL IVPB (10 mEq Intravenous Not Given 04/11/19 0007)  0.9 % NaCl with KCl 20 mEq/ L  infusion ( Intravenous New Bag/Given 04/11/19 0200)  LORazepam (ATIVAN) tablet 1 mg (has no administration in time range)    Or  LORazepam (ATIVAN) injection 1 mg (has no administration in time range)  thiamine (VITAMIN B-1) tablet 100 mg (has no administration in time range)    Or  thiamine (B-1) injection 100 mg (has no administration in time range)  folic acid (FOLVITE) tablet 1 mg (has no administration in time range)  multivitamin with minerals tablet 1 tablet (has no administration in time range)   LORazepam (ATIVAN) injection 0-4 mg (0 mg Intravenous Not Given 04/11/19 0009)    Followed by  LORazepam (ATIVAN) injection 0-4 mg (has no administration in time range)  phenytoin (DILANTIN) ER capsule 100 mg (has no administration in time range)  Ensure Plus liquid 237 mL (has no administration in time range)  albuterol (VENTOLIN HFA) 108 (90 Base) MCG/ACT inhaler 2 puff (has no administration in time range)  pantoprazole (PROTONIX) injection 40 mg (40 mg Intravenous Given 04/11/19 0155)  hydrALAZINE (APRESOLINE) injection 10 mg (has no administration in time range)  potassium chloride 10 mEq in 100 mL IVPB (0 mEq Intravenous Stopped 04/11/19 0144)    Mobility walks Low fall risk   Focused Assessments    R Recommendations: See Admitting Provider Note  Report given to:   Additional Notes:

## 2019-04-11 NOTE — H&P (Addendum)
TRH H&P    Patient Demographics:    [name], is a 63 y.o. female  MRN: [id number]  DOB - 11-14-1955  Admit Date - 04/10/2019  Referring MD/NP/PA: Denton Meek  Outpatient Primary MD for the patient is [name], MD [facility] Drain -  GI  Patient coming from:  home  Chief complaint-  Abdominal pain   HPI:    [name]  is a 63 y.o. female, w hypertension, h/o stroke with R arm contracture, Asthma, ETOH abuse, Gerd, PUD,  apparently presents w/ c/o epigastric discomfort for the past 3 days.  Pt states sharp pain, associated with n/v,  Pt denies hematemesis.  Pt notes diarrhea, (chronic) 3-4 liquid stool per day slightly worse recently.  Pt denies brbpr.  Pt denies Nsaid use.  Pt has not drank recently.  Pt notes prior EGD 12/27/2017=> grade D reflux esophagitis, colonoscopy 10/03/2012=> diverticulosis  In ED,  T 98.3, P 104, R 18, Bp 156/73  Pox 98% Wt 64.9 kg  Wbc 6.5, hgb 11.8, Plt 182   Na 128, K 3.0, Bun 21, Creatinine 2.01  Glucose 103  CT abd/ pelvis IMPRESSION: 1. No acute intra-abdominal abnormality. 2. Cirrhosis. 3. Sigmoid diverticulosis without CT evidence of diverticulitis. 4. Normal appendix in the right lower quadrant.  No hydronephrosis.  Pt will be admitted for epigastric pain, and diarrhea, and ARF.     Review of systems:    In addition to the HPI above,  No Fever-chills, No Headache, No changes with Vision or hearing, No problems swallowing food or Liquids, No Chest pain, Cough or Shortness of Breath,  No Blood in stool or Urine, No dysuria, No new skin rashes or bruises, No new joints pains-aches,  No new weakness, tingling, numbness in any extremity, No recent weight gain or loss, No polyuria, polydypsia or polyphagia, No significant Mental Stressors.  All other systems reviewed and are negative.    Past History of the following :    Past  Medical History:  Diagnosis Date  . Alcohol abuse   . Asthma   . Contracture of muscle of hand    right  . GERD (gastroesophageal reflux disease)   . HTN (hypertension)   . PUD (peptic ulcer disease)    remote  . Seizures ([place])    since stroke, no recent seizures  . Stroke [facility])    age 67, required brain surgery      Past Surgical History:  Procedure Laterality Date  . ABDOMINAL HYSTERECTOMY  2004   complete  . BRAIN SURGERY     age 46, stroke  . COLONOSCOPY    . COLONOSCOPY WITH PROPOFOL  10/03/2012   Dr. [name]: moderate diverticulosis, small internal hemorrhoids, TUBULAR ADENOMA. Next colonoscopy 2023 per Dr. [name].  . ESOPHAGOGASTRODUODENOSCOPY N/A 10/18/2014   Dr. Gala Romney during inpatient hospitalization: severe exudative/erosive reflux esophagitis as source of trivial upper GI bleed. No varices   . ESOPHAGOGASTRODUODENOSCOPY (EGD) WITH PROPOFOL  10/03/2012   Dr. [name]: stricture at [place] s/p dilation, mild gastritis negative H.pylori   .  ESOPHAGOGASTRODUODENOSCOPY (EGD) WITH PROPOFOL N/A 12/27/2017   Procedure: ESOPHAGOGASTRODUODENOSCOPY (EGD) WITH PROPOFOL;  Surgeon: Danie Binder, MD;  Location: [facility];  Service: Endoscopy;  Laterality: N/A;  10:15am  . right tib-fib fracture  2008  . SAVORY DILATION  10/03/2012   Procedure: SAVORY DILATION;  Surgeon: Danie Binder, MD;  Location: [facility] ORS;  Service: Endoscopy;  Laterality: N/A;  started at 1303,  dilated 12.8-16mm      Social History:      Social History   Tobacco Use  . Smoking status: Former Smoker    Packs/day: 0.04    Years: 9.00    Pack years: 0.36    Types: Cigarettes    Quit date: 07/28/2014    Years since quitting: 4.7  . Smokeless tobacco: Never Used  Substance Use Topics  . Alcohol use: Yes    Alcohol/week: 35.0 standard drinks    Types: 35 Cans of beer per week    Comment: last use 2 weeks ago       Family History :     Family History  Problem Relation Age of Onset  .  Liver disease Sister        etoh  . Colon cancer Neg Hx        Home Medications:   Prior to Admission medications   Medication Sig Start Date End Date Taking? Authorizing Provider  acetaminophen (TYLENOL) 325 MG tablet Take 650 mg by mouth every 6 (six) hours as needed.    [provider]  albuterol (PROVENTIL HFA;VENTOLIN HFA) 108 (90 BASE) MCG/ACT inhaler Inhale 2 puffs into the lungs every 6 (six) hours as needed for wheezing or shortness of breath.     [provider]  ENSURE PLUS (ENSURE PLUS) LIQD Take 237 mLs by mouth 3 (three) times daily between meals.    [provider]  HYDROcodone-acetaminophen (NORCO/VICODIN) 5-325 MG tablet One tablet by mouth every six hours as needed for pain. 03/20/19   Sanjuana Kava, MD  ipratropium-albuterol (DUONEB) 0.5-2.5 (3) MG/3ML SOLN Take 3 mLs by nebulization every 6 (six) hours as needed (for shortness of breath/wheezing).     [provider]  lidocaine (XYLOCAINE) 2 % solution Using a syringe, 10 mL PO q4h or 30 MINS PRIOR TO MEALS AND PRN FOR ABDOMINAL/CHEST PAIN. NO MORE> 8 DOSES/DAY. 02/14/19   Fields, Marga Melnick, MD  linaclotide [facility]) 145 MCG CAPS capsule 1 PO WITH your first meal Patient taking differently: Take 145 mcg by mouth daily before breakfast. your first meal 06/23/18   Danie Binder, MD  naproxen (NAPROSYN) 500 MG tablet  03/02/19   [provider]  ondansetron (ZOFRAN ODT) 4 MG disintegrating tablet Take 1 tablet (4 mg total) by mouth every 8 (eight) hours as needed for nausea or vomiting. 01/24/19   Kathie Dike, MD  pantoprazole (PROTONIX) 40 MG tablet Take 1 tablet (40 mg total) by mouth every morning. [place] 02/15/19   Fields, Marga Melnick, MD  phenytoin (DILANTIN) 100 MG ER capsule Take 100 mg by mouth 3 (three) times daily.    [provider]     Allergies:    No Known Allergies   Physical Exam:   Vitals  Blood pressure (!) 156/73, pulse (!) 104,  temperature 98.3 F (36.8 C), temperature source Oral, resp. rate 18, height 5\' 4"  (1.626 m), weight 64.9 kg, SpO2 98 %.  1.  General: axoxo3  2. Psychiatric: euthymic  3. Neurologic: cn2-12 intact, +  contracture RUE  4. HEENMT:  Anicteric, pupils 1.67mm symmetric, direct, consensual, near intact Neck: no jvd  5. Respiratory : CTAB  6. Cardiovascular : rrr s1, s2,   7. Gastrointestinal:  Abd: soft, nt, nd, +bs  8. Skin:  Ext: no c/c/e., no rash, negative cullens sign   9.Musculoskeletal:  Good ROM  No adenoapthy    Data Review:    CBC Recent Labs  Lab 04/10/19 1845  WBC 6.5  HGB 11.8*  HCT 35.6*  PLT 182  MCV 95.2  MCH 31.6  MCHC 33.1  RDW 13.8   ------------------------------------------------------------------------------------------------------------------  Results for orders placed or performed during the hospital encounter of 04/10/19 (from the past 48 hour(s))  Lipase, blood     Status: None   Collection Time: 04/10/19  6:45 PM  Result Value Ref Range   Lipase 36 11 - 51 U/L    Comment: Performed at [facility], [address] [place], [place] [address]  Comprehensive metabolic panel     Status: Abnormal   Collection Time: 04/10/19  6:45 PM  Result Value Ref Range   Sodium 128 (L) 135 - 145 mmol/L   Potassium 3.0 (L) 3.5 - 5.1 mmol/L   Chloride 78 (L) 98 - 111 mmol/L   CO2 30 22 - 32 mmol/L   Glucose, Bld 103 (H) 70 - 99 mg/dL   BUN 21 8 - 23 mg/dL   Creatinine, Ser 2.01 (H) 0.44 - 1.00 mg/dL   Calcium 9.5 8.9 - 10.3 mg/dL   Total Protein 9.5 (H) 6.5 - 8.1 g/dL   Albumin 4.4 3.5 - 5.0 g/dL   AST 82 (H) 15 - 41 U/L   ALT 31 0 - 44 U/L   Alkaline Phosphatase 126 38 - 126 U/L   Total Bilirubin 1.7 (H) 0.3 - 1.2 mg/dL   GFR calc non Af Amer 26 (L) >60 mL/min   GFR calc Af Amer 30 (L) >60 mL/min   Anion gap 20 (H) 5 - 15    Comment: Performed at [facility], [address] [place], [place] [address]  CBC     Status: Abnormal    Collection Time: 04/10/19  6:45 PM  Result Value Ref Range   WBC 6.5 4.0 - 10.5 K/uL   RBC 3.74 (L) 3.87 - 5.11 MIL/uL   Hemoglobin 11.8 (L) 12.0 - 15.0 g/dL   HCT 35.6 (L) 36.0 - 46.0 %   MCV 95.2 80.0 - 100.0 fL   MCH 31.6 26.0 - 34.0 pg   MCHC 33.1 30.0 - 36.0 g/dL   RDW 13.8 11.5 - 15.5 %   Platelets 182 150 - 400 K/uL   nRBC 0.0 0.0 - 0.2 %    Comment: Performed at [facility], [address], [place], [place] [address]  Ethanol     Status: None   Collection Time: 04/10/19  6:45 PM  Result Value Ref Range   Alcohol, Ethyl (B) <10 <10 mg/dL    Comment: (NOTE) Lowest detectable limit for serum alcohol is 10 mg/dL. For medical purposes only. Performed at [facility], [address], [place], [place] [address]   Magnesium     Status: None   Collection Time: 04/10/19  6:45 PM  Result Value Ref Range   Magnesium 2.0 1.7 - 2.4 mg/dL    Comment: Performed at [facility], [address], [place], [place] [address]    Chemistries  Recent Labs  Lab 04/10/19 1845  NA 128*  K 3.0*  CL  78*  CO2 30  GLUCOSE 103*  BUN 21  CREATININE 2.01*  CALCIUM 9.5  MG 2.0  AST 82*  ALT 31  ALKPHOS 126  BILITOT 1.7*   ------------------------------------------------------------------------------------------------------------------  ------------------------------------------------------------------------------------------------------------------ GFR: Estimated Creatinine Clearance: 25.1 mL/min (A) (by C-G formula based on SCr of 2.01 mg/dL (H)). Liver Function Tests: Recent Labs  Lab 04/10/19 1845  AST 82*  ALT 31  ALKPHOS 126  BILITOT 1.7*  PROT 9.5*  ALBUMIN 4.4   Recent Labs  Lab 04/10/19 1845  LIPASE 36   No results for input(s): AMMONIA in the last 168 hours. Coagulation Profile: No results for input(s): INR, PROTIME in the last 168 hours. Cardiac Enzymes: No results for input(s): CKTOTAL, CKMB, CKMBINDEX, TROPONINI in the last 168 hours. BNP (last 3 results) No  results for input(s): PROBNP in the last 8760 hours. HbA1C: No results for input(s): HGBA1C in the last 72 hours. CBG: No results for input(s): GLUCAP in the last 168 hours. Lipid Profile: No results for input(s): CHOL, HDL, LDLCALC, TRIG, CHOLHDL, LDLDIRECT in the last 72 hours. Thyroid Function Tests: No results for input(s): TSH, T4TOTAL, FREET4, T3FREE, THYROIDAB in the last 72 hours. Anemia Panel: No results for input(s): VITAMINB12, FOLATE, FERRITIN, TIBC, IRON, RETICCTPCT in the last 72 hours.  --------------------------------------------------------------------------------------------------------------- Urine analysis:    Component Value Date/Time   COLORURINE AMBER (A) 01/22/2019 1540   APPEARANCEUR HAZY (A) 01/22/2019 1540   LABSPEC 1.015 01/22/2019 1540   PHURINE 7.0 01/22/2019 1540   GLUCOSEU NEGATIVE 01/22/2019 1540   HGBUR NEGATIVE 01/22/2019 1540   BILIRUBINUR NEGATIVE 01/22/2019 1540   KETONESUR NEGATIVE 01/22/2019 1540   PROTEINUR 30 (A) 01/22/2019 1540   UROBILINOGEN 0.2 12/26/2014 1927   NITRITE NEGATIVE 01/22/2019 1540   LEUKOCYTESUR NEGATIVE 01/22/2019 1540      Imaging Results:    Ct Abdomen Pelvis Wo Contrast  Result Date: 04/10/2019 CLINICAL DATA:  Abdominal pain.  History of pancreatitis. EXAM: CT ABDOMEN AND PELVIS WITHOUT CONTRAST TECHNIQUE: Multidetector CT imaging of the abdomen and pelvis was performed following the standard protocol without IV contrast. COMPARISON:  CT dated January 24, 2019. FINDINGS: Lower chest: No acute abnormality. Hepatobiliary: There is evidence of cirrhosis without a discrete hepatic mass, however evaluation is severely limited by lack of IV contrast. The gallbladder is unremarkable. Pancreas: Unremarkable. No pancreatic ductal dilatation or surrounding inflammatory changes. Spleen: Normal in size without focal abnormality. Adrenals/Urinary Tract: Adrenal glands are unremarkable. Kidneys are normal, without renal calculi, focal  lesion, or hydronephrosis. Bladder is unremarkable. Stomach/Bowel: There is sigmoid diverticulosis without CT evidence of diverticulitis. The stomach is unremarkable aside from a small hiatal hernia. There is no evidence of a small-bowel obstruction. There is a normal appearing appendix in the right lower quadrant. Vascular/Lymphatic: Aortic atherosclerosis. No enlarged abdominal or pelvic lymph nodes. Reproductive: Status post hysterectomy. No adnexal masses. Other: No abdominal wall hernia or abnormality. No abdominopelvic ascites. Musculoskeletal: No fracture is seen. IMPRESSION: 1. No acute intra-abdominal abnormality. 2. Cirrhosis. 3. Sigmoid diverticulosis without CT evidence of diverticulitis. 4. Normal appendix in the right lower quadrant.  No hydronephrosis. Electronically Signed   By: Constance Holster M.D.   On: 04/10/2019 22:56       Assessment & Plan:    Principal Problem:   ARF (acute renal failure) (HCC) Active Problems:   GERD (gastroesophageal reflux disease)   Abdominal pain   Hypokalemia   History of stroke   Diarrhea  ARF secondary to dehydration Hydrate with ns iv Check cmp in  am  Hypokalemia Replete Check cmp in am If not improving consider checking magnesium  Abdominal pain ? Alcoholic gastritis protonix 40mg  iv bid Gastroenterology consult requested in computer, appreciate input  Diarrhea likely due to pancreatic insufficiency ddx infectious Check stool C. Diff, stool for GI pathogen panel If negative then consider CREON  H/o stroke/ seizure do Cont Dilantin 100mg  po tid  Constipation Cont Linzess  H/o Asthma Cont Albuterol HFA prn  ETOH abuse CIWA  Hypertension  ??? Hydralazine 10mg  iv q6h prn sbp >160  DVT Prophylaxis-   Lovenox - SCDs   AM Labs Ordered, also please review Full Orders  Family Communication: Admission, patients condition and plan of care including tests being ordered have been discussed with the patient  who indicate  understanding and agree with the plan and Code Status.  Code Status:  FULL CODE,  Pt states that she doesn't need me to notify her family.    Admission status: Observation: Based on patients clinical presentation and evaluation of above clinical data, I have made determination that patient meets observation criteria at this time.  Time spent in minutes : 70 min   Jani Gravel M.D on 04/11/2019 at 12:40 AM

## 2019-04-11 NOTE — Consult Note (Addendum)
Referring Provider: No ref. provider found Primary Care Physician:  [name], MD Primary Gastroenterologist:  Dr. [name]  Date of Admission:  Date of Consultation:   Reason for Consultation:  Alcoholic cirhrosis with increased bilirubin  HPI:  [name] is a 63 y.o. female with a past medical history of alcoholic cirrhosis, alcohol abuse, asthma, GERD, peptic ulcer disease in the remote past, seizures, stroke at age 56 requiring brain surgery.  The patient was recently seen by our practice 10/18/7508 alcoholic cirrhosis.  She discussed what she felt was heartburn pain with on and off nausea and minimal vomiting.  Bowel movements were runny and dark.  No alcohol that week or the week prior as it "does not agree with my stomach".  Taking Alka-Seltzer more than 1 a day and had been off PPI.  Noted well compensated alcoholic cirrhosis with recent labs in April 2020 and hemoglobin stable at 10.8.  Recommended ultrasound after June 1 and follow-up in 6 months.  Recommended avoid reflux triggers.  Emergency room physician and hospitalist notes reviewed.  The patient presented with complaints of epigastric discomfort over the previous 3 days that is sharp and associated with nausea and vomiting.  Also noted diarrhea with 3-4 liquid stools a day that is slightly worse recently.  Denies hematochezia or melena.  The patient states she has not drank recently.  Most recent EGD 12/27/2017 with grade D reflux esophagitis.  Colonoscopy last completed 10/03/2012 with noted diverticulosis.  CT of the abdomen and pelvis was completed which found no acute process, none cirrhosis, sigmoid diverticulosis without diverticulitis.  The patient was admitted for epigastric pain, diarrhea, acute renal failure.  Labs in the emergency department found normal lipase, hypokalemia, hyponatremia, normal BUN, elevated creatinine at 2.01 with baseline typically 1-2, normal albumin at 4.4, mildly elevated AST at 82 ALT normal  at 31.  Alk phos normal 126.  Total bilirubin with a slight bump to 1.7 (recent baseline 0.9-1.3).  Query an element of dehydration.  MELD 01/22/19 (approximated with most recent available INR): 15; C/P: A   Today she states she is feeling better.  Has not had a bowel movement since admission.  Has had some intermittent nausea but no vomiting.  Denies yellowing of skin or eyes, darkened urine, acute episodic confusion, tremors/shakes.  Denies any recent alcohol consumption.  She admits she tried to drink last Saturday but it caused severe gastritis type symptoms and vomiting.  No other GI complaints at this time.  Past Medical History:  Diagnosis Date  . Alcohol abuse   . Asthma   . Contracture of muscle of hand    right  . GERD (gastroesophageal reflux disease)   . HTN (hypertension)   . PUD (peptic ulcer disease)    remote  . Seizures ([place])    since stroke, no recent seizures  . Stroke [facility])    age 72, required brain surgery    Past Surgical History:  Procedure Laterality Date  . ABDOMINAL HYSTERECTOMY  2004   complete  . BRAIN SURGERY     age 46, stroke  . COLONOSCOPY    . COLONOSCOPY WITH PROPOFOL  10/03/2012   Dr. [name]: moderate diverticulosis, small internal hemorrhoids, TUBULAR ADENOMA. Next colonoscopy 2023 per Dr. [name].  . ESOPHAGOGASTRODUODENOSCOPY N/A 10/18/2014   Dr. Gala Romney during inpatient hospitalization: severe exudative/erosive reflux esophagitis as source of trivial upper GI bleed. No varices   . ESOPHAGOGASTRODUODENOSCOPY (EGD) WITH PROPOFOL  10/03/2012   Dr. [name]: stricture at [place] junction  s/p dilation, mild gastritis negative H.pylori   . ESOPHAGOGASTRODUODENOSCOPY (EGD) WITH PROPOFOL N/A 12/27/2017   Procedure: ESOPHAGOGASTRODUODENOSCOPY (EGD) WITH PROPOFOL;  Surgeon: Danie Binder, MD;  Location: [facility] END[facility] SUITE;  Service: Endoscopy;  Laterality: N/A;  10:15am  . right tib-fib fracture  2008  . SAVORY DILATION  10/03/2012   Procedure: SAVORY DILATION;   Surgeon: Danie Binder, MD;  Location: [facility] ORS;  Service: Endoscopy;  Laterality: N/A;  started at 1303,  dilated 12.8-16mm    Prior to Admission medications   Medication Sig Start Date End Date Taking? Authorizing Provider  acetaminophen (TYLENOL) 325 MG tablet Take 650 mg by mouth every 6 (six) hours as needed.    [provider]  albuterol (PROVENTIL HFA;VENTOLIN HFA) 108 (90 BASE) MCG/ACT inhaler Inhale 2 puffs into the lungs every 6 (six) hours as needed for wheezing or shortness of breath.     [provider]  ENSURE PLUS (ENSURE PLUS) LIQD Take 237 mLs by mouth 3 (three) times daily between meals.    [provider]  HYDROcodone-acetaminophen (NORCO/VICODIN) 5-325 MG tablet One tablet by mouth every six hours as needed for pain. 03/20/19   Sanjuana Kava, MD  ipratropium-albuterol (DUONEB) 0.5-2.5 (3) MG/3ML SOLN Take 3 mLs by nebulization every 6 (six) hours as needed (for shortness of breath/wheezing).     [provider]  lidocaine (XYLOCAINE) 2 % solution Using a syringe, 10 mL PO q4h or 30 MINS PRIOR TO MEALS AND PRN FOR ABDOMINAL/CHEST PAIN. NO MORE> 8 DOSES/DAY. 02/14/19   Fields, Marga Melnick, MD  linaclotide [facility]) 145 MCG CAPS capsule 1 PO WITH your first meal Patient taking differently: Take 145 mcg by mouth daily before breakfast. your first meal 06/23/18   Danie Binder, MD  naproxen (NAPROSYN) 500 MG tablet  03/02/19   [provider]  ondansetron (ZOFRAN ODT) 4 MG disintegrating tablet Take 1 tablet (4 mg total) by mouth every 8 (eight) hours as needed for nausea or vomiting. 01/24/19   Kathie Dike, MD  pantoprazole (PROTONIX) 40 MG tablet Take 1 tablet (40 mg total) by mouth every morning. [place] 02/15/19   Fields, Marga Melnick, MD  phenytoin (DILANTIN) 100 MG ER capsule Take 100 mg by mouth 3 (three) times daily.    [provider]    Current Facility-Administered Medications  Medication Dose Route Frequency  Provider Last Rate Last Dose  . 0.9 %  sodium chloride infusion   Intravenous Once Lorayne Bender, PA-C   Stopped at 04/10/19 2144  . 0.9 % NaCl with KCl 20 mEq/ L  infusion   Intravenous Continuous Jani Gravel, MD 75 mL/hr at 04/11/19 0200    . albuterol (PROVENTIL) (2.5 MG/3ML) 0.083% nebulizer solution 2.5 mg  2.5 mg Nebulization Q6H PRN Johnson, Clanford L, MD      . feeding supplement (ENSURE ENLIVE) (ENSURE ENLIVE) liquid 237 mL  237 mL Oral TID BM Jani Gravel, MD      . folic acid (FOLVITE) tablet 1 mg  1 mg Oral Daily Jani Gravel, MD      . hydrALAZINE (APRESOLINE) injection 10 mg  10 mg Intravenous Q6H PRN Jani Gravel, MD      . LORazepam (ATIVAN) injection 0-4 mg  0-4 mg Intravenous Q6H Jani Gravel, MD       Followed by  . [START ON 04/12/2019] LORazepam (ATIVAN) injection 0-4 mg  0-4 mg Intravenous Q12H Jani Gravel, MD      .  LORazepam (ATIVAN) tablet 1 mg  1 mg Oral Q6H PRN Jani Gravel, MD       Or  . LORazepam (ATIVAN) injection 1 mg  1 mg Intravenous Q6H PRN Jani Gravel, MD      . multivitamin with minerals tablet 1 tablet  1 tablet Oral Daily Jani Gravel, MD      . pantoprazole (PROTONIX) injection 40 mg  40 mg Intravenous Marjean Donna, MD   40 mg at 04/11/19 0155  . phenytoin (DILANTIN) ER capsule 100 mg  100 mg Oral TID Jani Gravel, MD      . potassium chloride 10 mEq in 100 mL IVPB  10 mEq Intravenous Once Joy, Shawn C, PA-C      . sodium chloride flush (NS) 0.9 % injection 3 mL  3 mL Intravenous Once Milton Ferguson, MD      . thiamine (VITAMIN B-1) tablet 100 mg  100 mg Oral Daily Jani Gravel, MD       Or  . thiamine (B-1) injection 100 mg  100 mg Intravenous Daily Jani Gravel, MD        Allergies as of 04/10/2019  . (No Known Allergies)    Family History  Problem Relation Age of Onset  . Liver disease Sister        etoh  . Colon cancer Neg Hx     Social History   Socioeconomic History  . Marital status: Single    Spouse name: Not on file  . Number of children: 3  .  Years of education: Not on file  . Highest education level: Not on file  Occupational History  . Occupation: disability    Employer: UNEMPLOYED  Social Needs  . Financial resource strain: Not on file  . Food insecurity    Worry: Not on file    Inability: Not on file  . Transportation needs    Medical: Not on file    Non-medical: Not on file  Tobacco Use  . Smoking status: Former Smoker    Packs/day: 0.04    Years: 9.00    Pack years: 0.36    Types: Cigarettes    Quit date: 07/28/2014    Years since quitting: 4.7  . Smokeless tobacco: Never Used  Substance and Sexual Activity  . Alcohol use: Yes    Alcohol/week: 35.0 standard drinks    Types: 35 Cans of beer per week    Comment: last use 2 weeks ago  . Drug use: No  . Sexual activity: Yes    Birth control/protection: Surgical  Lifestyle  . Physical activity    Days per week: Not on file    Minutes per session: Not on file  . Stress: Not on file  Relationships  . Social Herbalist on phone: Not on file    Gets together: Not on file    Attends religious service: Not on file    Active member of club or organization: Not on file    Attends meetings of clubs or organizations: Not on file    Relationship status: Not on file  . Intimate partner violence    Fear of current or ex partner: Not on file    Emotionally abused: Not on file    Physically abused: Not on file    Forced sexual activity: Not on file  Other Topics Concern  . 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 HAS 3 KIDS:  ONE IN [place], ONE IN [place], AND ONE OOT.    Review of Systems: General: Negative for anorexia, weight loss, fever, chills, fatigue, weakness. ENT: Negative for hoarseness, difficulty swallowing. CV: Negative for chest pain, angina, palpitations, peripheral edema.  Respiratory: Negative for dyspnea at rest, cough, sputum, wheezing.  GI: See history of present illness. Derm: Negative for rash or  itching.  Neuro: Negative for memory loss, confusion.  Endo: Negative for unusual weight change.  Heme: Negative for bruising or bleeding. Allergy: Negative for rash or hives.  Physical Exam: Vital signs in last 24 hours: Temp:  [98.2 F (36.8 C)-98.3 F (36.8 C)] 98.2 F (36.8 C) (06/24 0535) Pulse Rate:  [90-113] 93 (06/24 0535) Resp:  [17-18] 18 (06/24 0535) BP: (117-157)/(58-94) 144/71 (06/24 0535) SpO2:  [96 %-100 %] 99 % (06/24 0535) Weight:  [64.9 kg-72.1 kg] 72.1 kg (06/24 0535) Last BM Date: 04/10/19 General:   Alert,  Well-developed, well-nourished, pleasant and cooperative in NAD Head:  Normocephalic and atraumatic. Eyes:  Sclera clear, no icterus. Conjunctiva pink. Ears:  Normal auditory acuity. Nose:  No deformity, discharge, or lesions. Lungs:  Clear throughout to auscultation.   No wheezes, crackles, or rhonchi. No acute distress. Heart:  Regular rate and rhythm; no murmurs, clicks, rubs,  or gallops. Abdomen:  Soft, and nondistended. Mild mid-abdominal "soreness" with palpation. No masses, hepatosplenomegaly or hernias noted. Normal bowel sounds, without guarding, and without rebound.   Rectal:  Deferred.   Msk:  Symmetrical without gross deformities. Pulses:  Normal bilateral DP pulses noted. Extremities:  Without clubbing or edema. Neurologic:  Alert and  oriented x4;  grossly normal neurologically. Psych:  Alert and cooperative. Normal mood and affect.  Intake/Output from previous day: 06/23 0701 - 06/24 0700 In: 5009 [I.V.:283.2; IV Piggyback:1189.7] Out: -  Intake/Output this shift: No intake/output data recorded.  Lab Results: Recent Labs    04/10/19 1845  WBC 6.5  HGB 11.8*  HCT 35.6*  PLT 182   BMET Recent Labs    04/10/19 1845  NA 128*  K 3.0*  CL 78*  CO2 30  GLUCOSE 103*  BUN 21  CREATININE 2.01*  CALCIUM 9.5   LFT Recent Labs    04/10/19 1845  PROT 9.5*  ALBUMIN 4.4  AST 82*  ALT 31  ALKPHOS 126  BILITOT 1.7*    PT/INR No results for input(s): LABPROT, INR in the last 72 hours. Hepatitis Panel No results for input(s): HEPBSAG, HCVAB, HEPAIGM, HEPBIGM in the last 72 hours. C-Diff No results for input(s): CDIFFTOX in the last 72 hours.  Studies/Results: Ct Abdomen Pelvis Wo Contrast  Result Date: 04/10/2019 CLINICAL DATA:  Abdominal pain.  History of pancreatitis. EXAM: CT ABDOMEN AND PELVIS WITHOUT CONTRAST TECHNIQUE: Multidetector CT imaging of the abdomen and pelvis was performed following the standard protocol without IV contrast. COMPARISON:  CT dated January 24, 2019. FINDINGS: Lower chest: No acute abnormality. Hepatobiliary: There is evidence of cirrhosis without a discrete hepatic mass, however evaluation is severely limited by lack of IV contrast. The gallbladder is unremarkable. Pancreas: Unremarkable. No pancreatic ductal dilatation or surrounding inflammatory changes. Spleen: Normal in size without focal abnormality. Adrenals/Urinary Tract: Adrenal glands are unremarkable. Kidneys are normal, without renal calculi, focal lesion, or hydronephrosis. Bladder is unremarkable. Stomach/Bowel: There is sigmoid diverticulosis without CT evidence of diverticulitis. The stomach is unremarkable aside from a small hiatal hernia. There is no evidence of a small-bowel obstruction. There is a normal appearing appendix in the right lower quadrant. Vascular/Lymphatic:  Aortic atherosclerosis. No enlarged abdominal or pelvic lymph nodes. Reproductive: Status post hysterectomy. No adnexal masses. Other: No abdominal wall hernia or abnormality. No abdominopelvic ascites. Musculoskeletal: No fracture is seen. IMPRESSION: 1. No acute intra-abdominal abnormality. 2. Cirrhosis. 3. Sigmoid diverticulosis without CT evidence of diverticulitis. 4. Normal appendix in the right lower quadrant.  No hydronephrosis. Electronically Signed   By: Constance Holster M.D.   On: 04/10/2019 22:56    Impression: Very pleasant 63 year old  female well-known to our practice for chronic alcoholic cirrhosis.  No recent alcohol intake.  When she does try to drink it causes significant gastritis and nausea/vomiting.  She was admitted for abdominal pain, nausea, vomiting.  Today her abdominal pain is very mild on palpation.  Stool studies are pending.  She has had a mild bump in her bilirubin but she is also likely significantly dehydrated which could be resulting in false elevation.  No overt hepatic symptoms.  Previously generally well compensated liver disease.  There is a question by the hospitalist of alcoholic gastritis and have ordered a PT/INR to calculate her discriminant function.  Current meld score is not able to be calculated due to no INR on file.  Noted chronic GERD not currently on a PPI at home.  Overall I feel her constellation symptoms is likely viral gastroenteritis.  Possible other occult infection pending stool pathogen panels.  No findings concerning for acute decompensation of her liver disease.  Abdominal pain likely reminiscent of GERD/gastritis.  Plan: 1. Update meld calculation with new labs this morning 2. Continue PPI.  Would likely benefit from outpatient therapy upon discharge as well. 3. Continue supportive measures 4. We will follow her stool pathogen panels when she does have a bowel movement. 5. Further recommendations to follow results   Thank you for allowing [place] to participate in the care of [place], DNP, AGNP-C Adult & Gerontological Nurse Practitioner [facility]    LOS: 0 days     04/11/2019, 8:14 AM

## 2019-04-12 ENCOUNTER — Encounter: Payer: Self-pay | Admitting: Gastroenterology

## 2019-04-12 DIAGNOSIS — R1084 Generalized abdominal pain: Secondary | ICD-10-CM

## 2019-04-12 DIAGNOSIS — E871 Hypo-osmolality and hyponatremia: Secondary | ICD-10-CM

## 2019-04-12 DIAGNOSIS — K7030 Alcoholic cirrhosis of liver without ascites: Secondary | ICD-10-CM

## 2019-04-12 LAB — GASTROINTESTINAL PANEL BY PCR, STOOL (REPLACES STOOL CULTURE)

## 2019-04-12 LAB — COMPREHENSIVE METABOLIC PANEL
ALT: 19 U/L (ref 0–44)
AST: 41 U/L (ref 15–41)
Albumin: 2.8 g/dL — ABNORMAL LOW (ref 3.5–5.0)
Alkaline Phosphatase: 82 U/L (ref 38–126)
Anion gap: 11 (ref 5–15)
BUN: 12 mg/dL (ref 8–23)
CO2: 25 mmol/L (ref 22–32)
Calcium: 8.3 mg/dL — ABNORMAL LOW (ref 8.9–10.3)
Chloride: 101 mmol/L (ref 98–111)
Creatinine, Ser: 0.65 mg/dL (ref 0.44–1.00)
GFR calc Af Amer: >60 mL/min (ref >60)
GFR calc non Af Amer: >60 mL/min (ref >60)
Glucose, Bld: 105 mg/dL — ABNORMAL HIGH (ref 70–99)
Potassium: 5.1 mmol/L (ref 3.5–5.1)
Sodium: 137 mmol/L (ref 135–145)
Total Bilirubin: 1.1 mg/dL (ref 0.3–1.2)
Total Protein: 6.1 g/dL — ABNORMAL LOW (ref 6.5–8.1)

## 2019-04-12 LAB — HEPATITIS PANEL, ACUTE
HCV Ab: 0.1 s/co ratio (ref 0.0–0.9)
Hep A IgM: NEGATIVE
Hep B C IgM: NEGATIVE
Hepatitis B Surface Ag: NEGATIVE

## 2019-04-12 LAB — CBC
HCT: 28 % — ABNORMAL LOW (ref 36.0–46.0)
Hemoglobin: 9.3 g/dL — ABNORMAL LOW (ref 12.0–15.0)
MCH: 32.3 pg (ref 26.0–34.0)
MCHC: 33.2 g/dL (ref 30.0–36.0)
MCV: 97.2 fL (ref 80.0–100.0)
Platelets: 111 10*3/uL — ABNORMAL LOW (ref 150–400)
RBC: 2.88 MIL/uL — ABNORMAL LOW (ref 3.87–5.11)
RDW: 13.7 % (ref 11.5–15.5)
WBC: 3.3 10*3/uL — ABNORMAL LOW (ref 4.0–10.5)
nRBC: 0 % (ref 0.0–0.2)

## 2019-04-12 LAB — MAGNESIUM: Magnesium: 1.7 mg/dL (ref 1.7–2.4)

## 2019-04-12 MED ORDER — SODIUM CHLORIDE 0.9 % IV SOLN
INTRAVENOUS | Status: DC
  Administered 2019-04-12: 10:00:00 via INTRAVENOUS

## 2019-04-12 MED ORDER — PANTOPRAZOLE SODIUM 40 MG IV SOLR
40.0000 mg | INTRAVENOUS | Status: DC
  Administered 2019-04-13: 40 mg via INTRAVENOUS
  Filled 2019-04-12: qty 40

## 2019-04-12 NOTE — Progress Notes (Signed)
PROGRESS NOTE    [name]  VOH:[id number]  DOB: May 06, 1956  DOA: 04/10/2019 PCP: [name], MD   Brief Admission Hx: 63 year old female with history of alcohol abuse, chronic liver disease, GERD, PUD, history of CVA with persistent right arm contracture, asthma presented with epigastric abdominal pain for 3 days prior to admission associated with 3-4 liquid stools per day which is more than her chronic diarrhea symptoms.  MDM/Assessment & Plan:   1. Dehydration secondary to worsening diarrhea symptoms- she is responding well to IV fluid hydration which we will continue.  Her renal function is improved with hydration.  Eating and drinking better, advancing diet today.  2. Hyponatremia-secondary to dehydration-resolved now with IV fluids which we will de-escalate. 3. Epigastric abdominal pain- CT of the abdomen did not reveal any acute findings.  Clinically she seems to be slowly improving with supportive therapy.  Continue Protonix.  GI consult appreciated. 4. Acute on chronic diarrhea- C diff negative, stools slowing down.  Feeling better. Advance diet.  5. Hypokalemia- this is been repleted we will continue to follow closely with magnesium. 6. Acute renal failure-secondary to dehydration-creatinine improving with IV fluid hydration. 7. Epilepsy/cerebrovascular disease with history of stroke-continue Dilantin 100 mg 3 times daily.  8. History of asthma- stable and controlled with current HFA treatment. 9. Alcohol abuse-CIWA protocol in place, no withdrawal symptoms at this time.  10. Chronic constipation-hold Linzess temporarily.  Patient requires frequent re-positioning of the body in ways that cannot be achieved with an ordinary bed or wedge pillow, to eliminate pain, reduce pressure, and the head of the bed to be elevated more than 30 degrees most of the time due to persistent right arm contracture secondary to CVA, and Asthma.  DVT prophylaxis: Lovenox/SCD Code Status:  Full Family Communication:  Disposition Plan: Continue inpatient treatments   Consultants:  GI  Procedures:    Antimicrobials:     Subjective: Patient feeling better today with maximal inpatient therapies, wants to advance diet today, stools slowing down  Objective: Vitals:   04/11/19 1736 04/11/19 2330 04/12/19 0554 04/12/19 1426  BP: 138/69 (!) 147/63 117/63 (!) 178/87  Pulse: 97 (!) 105 92 99  Resp: 18 17 17 16   Temp: 98.5 F (36.9 C) 98.7 F (37.1 C) 98.3 F (36.8 C) 98 F (36.7 C)  TempSrc: Oral Oral  Oral  SpO2: 99% 100% 99% 100%  Weight:   74.3 kg   Height:        Intake/Output Summary (Last 24 hours) at 04/12/2019 1429 Last data filed at 04/12/2019 0935 Gross per 24 hour  Intake 1394.9 ml  Output 1700 ml  Net -305.1 ml   Filed Weights   04/10/19 1612 04/11/19 0535 04/12/19 0554  Weight: 64.9 kg 72.1 kg 74.3 kg     REVIEW OF SYSTEMS  As per history otherwise all reviewed and reported negative  Exam:  General exam: Chronically ill-appearing female lying in the bed she is awake and alert with mild distress. Respiratory system: Clear. No increased work of breathing. Cardiovascular system: S1 & S2 heard. No JVD, murmurs, gallops, clicks or pedal edema. Gastrointestinal system: Abdomen is nondistended, soft and with diffuse nonspecific tenderness no guarding and no rebound tenderness. Normal bowel sounds heard. Central nervous system: Alert and oriented. No focal neurological deficits. Extremities: no CCE.  Data Reviewed: Basic Metabolic Panel: Recent Labs  Lab 04/10/19 1845 04/11/19 0841 04/12/19 0552  NA 128* 132* 137  K 3.0* 4.1 5.1  CL 78* 89* 101  CO2 30 29 25   GLUCOSE 103* 101* 105*  BUN 21 17 12   CREATININE 2.01* 1.10* 0.65  CALCIUM 9.5 8.9 8.3*  MG 2.0  --  1.7   Liver Function Tests: Recent Labs  Lab 04/10/19 1845 04/11/19 0841 04/12/19 0552  AST 82* 54* 41  ALT 31 24 19   ALKPHOS 126 107 82  BILITOT 1.7* 1.6* 1.1  PROT  9.5* 7.3 6.1*  ALBUMIN 4.4 3.3* 2.8*   Recent Labs  Lab 04/10/19 1845  LIPASE 36   No results for input(s): AMMONIA in the last 168 hours. CBC: Recent Labs  Lab 04/10/19 1845 04/11/19 0841 04/12/19 0552  WBC 6.5 3.7* 3.3*  HGB 11.8* 10.5* 9.3*  HCT 35.6* 31.7* 28.0*  MCV 95.2 95.8 97.2  PLT 182 122* 111*   Cardiac Enzymes: No results for input(s): CKTOTAL, CKMB, CKMBINDEX, TROPONINI in the last 168 hours. CBG (last 3)  No results for input(s): GLUCAP in the last 72 hours. Recent Results (from the past 240 hour(s))  SARS Coronavirus 2 (CEPHEID - Performed in [facility] hospital lab), Hosp Order     Status: None   Collection Time: 04/11/19  1:44 AM   Specimen: Nasopharyngeal Swab  Result Value Ref Range Status   SARS Coronavirus 2 NEGATIVE NEGATIVE Final    Comment: (NOTE) If result is NEGATIVE SARS-CoV-2 target nucleic acids are NOT DETECTED. The SARS-CoV-2 RNA is generally detectable in upper and lower  respiratory specimens during the acute phase of infection. The lowest  concentration of SARS-CoV-2 viral copies this assay can detect is 250  copies / mL. A negative result does not preclude SARS-CoV-2 infection  and should not be used as the sole basis for treatment or other  patient management decisions.  A negative result may occur with  improper specimen collection / handling, submission of specimen other  than nasopharyngeal swab, presence of viral mutation(s) within the  areas targeted by this assay, and inadequate number of viral copies  (<250 copies / mL). A negative result must be combined with clinical  observations, patient history, and epidemiological information. If result is POSITIVE SARS-CoV-2 target nucleic acids are DETECTED. The SARS-CoV-2 RNA is generally detectable in upper and lower  respiratory specimens dur ing the acute phase of infection.  Positive  results are indicative of active infection with SARS-CoV-2.  Clinical  correlation with  patient history and other diagnostic information is  necessary to determine patient infection status.  Positive results do  not rule out bacterial infection or co-infection with other viruses. If result is PRESUMPTIVE POSTIVE SARS-CoV-2 nucleic acids MAY BE PRESENT.   A presumptive positive result was obtained on the submitted specimen  and confirmed on repeat testing.  While 2019 novel coronavirus  (SARS-CoV-2) nucleic acids may be present in the submitted sample  additional confirmatory testing may be necessary for epidemiological  and / or clinical management purposes  to differentiate between  SARS-CoV-2 and other Sarbecovirus currently known to infect humans.  If clinically indicated additional testing with an alternate test  methodology [phone]) is advised. The SARS-CoV-2 RNA is generally  detectable in upper and lower respiratory sp ecimens during the acute  phase of infection. The expected result is Negative. Fact Sheet for Patients:  StrictlyIdeas.no Fact Sheet for Healthcare Providers: BankingDealers.co.za This test is not yet approved or cleared by the [place] FDA and has been authorized for detection and/or diagnosis of SARS-CoV-2 by FDA under an Emergency Use Authorization (EUA).  This EUA will remain in  effect (meaning this test can be used) for the duration of the COVID-19 declaration under Section 564(b)(1) of the Act, 21 U.S.C. section 360bbb-3(b)(1), unless the authorization is terminated or revoked sooner. Performed at [facility], [address], [place], [place] [address]   C difficile quick scan w PCR reflex     Status: None   Collection Time: 04/11/19  8:15 PM   Specimen: Stool  Result Value Ref Range Status   C Diff antigen NEGATIVE NEGATIVE Final   C Diff toxin NEGATIVE NEGATIVE Final   C Diff interpretation No C. difficile detected.  Final    Comment: Performed at [facility], [address],  [place], [place] [address]    Studies: Ct Abdomen Pelvis Wo Contrast  Result Date: 04/10/2019 CLINICAL DATA:  Abdominal pain.  History of pancreatitis. EXAM: CT ABDOMEN AND PELVIS WITHOUT CONTRAST TECHNIQUE: Multidetector CT imaging of the abdomen and pelvis was performed following the standard protocol without IV contrast. COMPARISON:  CT dated January 24, 2019. FINDINGS: Lower chest: No acute abnormality. Hepatobiliary: There is evidence of cirrhosis without a discrete hepatic mass, however evaluation is severely limited by lack of IV contrast. The gallbladder is unremarkable. Pancreas: Unremarkable. No pancreatic ductal dilatation or surrounding inflammatory changes. Spleen: Normal in size without focal abnormality. Adrenals/Urinary Tract: Adrenal glands are unremarkable. Kidneys are normal, without renal calculi, focal lesion, or hydronephrosis. Bladder is unremarkable. Stomach/Bowel: There is sigmoid diverticulosis without CT evidence of diverticulitis. The stomach is unremarkable aside from a small hiatal hernia. There is no evidence of a small-bowel obstruction. There is a normal appearing appendix in the right lower quadrant. Vascular/Lymphatic: Aortic atherosclerosis. No enlarged abdominal or pelvic lymph nodes. Reproductive: Status post hysterectomy. No adnexal masses. Other: No abdominal wall hernia or abnormality. No abdominopelvic ascites. Musculoskeletal: No fracture is seen. IMPRESSION: 1. No acute intra-abdominal abnormality. 2. Cirrhosis. 3. Sigmoid diverticulosis without CT evidence of diverticulitis. 4. Normal appendix in the right lower quadrant.  No hydronephrosis. Electronically Signed   By: Constance Holster M.D.   On: 04/10/2019 22:56   Scheduled Meds: . feeding supplement (ENSURE ENLIVE)  237 mL Oral BID BM  . folic acid  1 mg Oral Daily  . LORazepam  0-4 mg Intravenous Q6H   Followed by  . LORazepam  0-4 mg Intravenous Q12H  . multivitamin with minerals  1 tablet Oral Daily  .  pantoprazole (PROTONIX) IV  40 mg Intravenous Q12H  . phenytoin  100 mg Oral TID  . sodium chloride flush  3 mL Intravenous Once  . thiamine  100 mg Oral Daily   Or  . thiamine  100 mg Intravenous Daily   Continuous Infusions: . sodium chloride Stopped (04/10/19 2144)  . sodium chloride 35 mL/hr at 04/12/19 6314    Principal Problem:   ARF (acute renal failure) (HCC) Active Problems:   GERD (gastroesophageal reflux disease)   Abdominal pain   Hypokalemia   History of stroke   Diarrhea   Nausea, vomiting, and diarrhea   Time spent:   Irwin Brakeman, MD [facility] 04/12/2019, 2:29 PM    LOS: 1 day  How to contact the [facility] Attending or Consulting provider [place] or covering provider during after hours [place], for this patient?  1. Check the care team in [facility] and look for a) attending/consulting [facility] provider listed and b) the [facility] team listed 2. Log into [email or web address] and use [place]'s universal password to access. If you do not have the password, please  contact the hospital operator. 3. Locate the [facility] provider you are looking for under [facility] and page to a number that you can be directly reached. 4. If you still have difficulty reaching the provider, please page the [facility] (Director on Call) for the Hospitalists listed on amion for assistance.

## 2019-04-12 NOTE — Progress Notes (Signed)
Subjective: Feels good today. No abdominal pain, N, vomiting. Did have 3 loose stools yesterday. Feels better overall. Tolerated her diet without issue. Wants to have "more solid food."  Objective: Vital signs in last 24 hours: Temp:  [98.1 F (36.7 C)-98.7 F (37.1 C)] 98.3 F (36.8 C) (06/25 0554) Pulse Rate:  [85-105] 92 (06/25 0554) Resp:  [17-18] 17 (06/25 0554) BP: (117-147)/(63-69) 117/63 (06/25 0554) SpO2:  [98 %-100 %] 99 % (06/25 0554) Weight:  [74.3 kg] 74.3 kg (06/25 0554) Last BM Date: 04/11/19 General:   Alert and oriented, pleasant. Not obviously febrile today. Head:  Normocephalic and atraumatic. Eyes:  No icterus, sclera clear. Conjuctiva pink.  Heart:  S1, S2 present, no murmurs noted.  Lungs: Clear to auscultation bilaterally, without wheezing, rales, or rhonchi.  Abdomen:  Bowel sounds present, soft, non-distended. Mild generalized TTP. No HSM or hernias noted. No rebound or guarding. No masses appreciated  Msk:  Symmetrical without gross deformities. Normal posture. Pulses:  Normal bilateral DP pulses noted. Extremities:  Without clubbing or edema. Neurologic:  Alert and  oriented x4;  grossly normal neurologically. Psych:  Alert and cooperative. Normal mood and affect.  Intake/Output from previous day: 06/24 0701 - 06/25 0700 In: 1394.9 [P.O.:840; I.V.:554.9] Out: 2300 [Urine:2300] Intake/Output this shift: No intake/output data recorded.  Lab Results: Recent Labs    04/10/19 1845 04/11/19 0841 04/12/19 0552  WBC 6.5 3.7* 3.3*  HGB 11.8* 10.5* 9.3*  HCT 35.6* 31.7* 28.0*  PLT 182 122* 111*   BMET Recent Labs    04/10/19 1845 04/11/19 0841 04/12/19 0552  NA 128* 132* 137  K 3.0* 4.1 5.1  CL 78* 89* 101  CO2 30 29 25   GLUCOSE 103* 101* 105*  BUN 21 17 12   CREATININE 2.01* 1.10* 0.65  CALCIUM 9.5 8.9 8.3*   LFT Recent Labs    04/10/19 1845 04/11/19 0841 04/12/19 0552  PROT 9.5* 7.3 6.1*  ALBUMIN 4.4 3.3* 2.8*  AST 82* 54* 41   ALT 31 24 19   ALKPHOS 126 107 82  BILITOT 1.7* 1.6* 1.1   PT/INR Recent Labs    04/11/19 1027  LABPROT 14.3  INR 1.1   Hepatitis Panel Recent Labs    04/11/19 0841  HEPBSAG Negative  HCVAB 0.1  HEPAIGM Negative  HEPBIGM Negative     Studies/Results: Ct Abdomen Pelvis Wo Contrast  Result Date: 04/10/2019 CLINICAL DATA:  Abdominal pain.  History of pancreatitis. EXAM: CT ABDOMEN AND PELVIS WITHOUT CONTRAST TECHNIQUE: Multidetector CT imaging of the abdomen and pelvis was performed following the standard protocol without IV contrast. COMPARISON:  CT dated January 24, 2019. FINDINGS: Lower chest: No acute abnormality. Hepatobiliary: There is evidence of cirrhosis without a discrete hepatic mass, however evaluation is severely limited by lack of IV contrast. The gallbladder is unremarkable. Pancreas: Unremarkable. No pancreatic ductal dilatation or surrounding inflammatory changes. Spleen: Normal in size without focal abnormality. Adrenals/Urinary Tract: Adrenal glands are unremarkable. Kidneys are normal, without renal calculi, focal lesion, or hydronephrosis. Bladder is unremarkable. Stomach/Bowel: There is sigmoid diverticulosis without CT evidence of diverticulitis. The stomach is unremarkable aside from a small hiatal hernia. There is no evidence of a small-bowel obstruction. There is a normal appearing appendix in the right lower quadrant. Vascular/Lymphatic: Aortic atherosclerosis. No enlarged abdominal or pelvic lymph nodes. Reproductive: Status post hysterectomy. No adnexal masses. Other: No abdominal wall hernia or abnormality. No abdominopelvic ascites. Musculoskeletal: No fracture is seen. IMPRESSION: 1. No acute intra-abdominal abnormality. 2. Cirrhosis. 3.  Sigmoid diverticulosis without CT evidence of diverticulitis. 4. Normal appendix in the right lower quadrant.  No hydronephrosis. Electronically Signed   By: Constance Holster M.D.   On: 04/10/2019 22:56    Assessment: Very  pleasant 63 year old female well-known to our practice for chronic alcoholic cirrhosis.  No recent alcohol intake.  When she does try to drink it causes significant gastritis and nausea/vomiting.  She was admitted for abdominal pain, nausea, vomiting.  Today her abdominal pain is very mild on palpation.  Stool studies are pending.  She has had a mild bump in her bilirubin but she is also likely significantly dehydrated which could be resulting in false elevation.  No overt hepatic symptoms.  Previously generally well compensated liver disease.  There was a question by the hospitalist of alcoholic gastritis but DF does not support this; overall liver number stable.  Current meld score is 10 (better than baseline).  Overall I feel her constellation symptoms is likely viral gastroenteritis.  CDiff quick scan negative; GI Path panel pending. No findings concerning for acute decompensation of her liver disease.  Abdominal pain likely reminiscent of GERD/gastritis and component of viral gastroenteritis.  Plan: 1. Monitor for acute change in symptoms 2. Continue PPI as IP and upon discharge 3. Follow Path panel 4. Advance diet to soft 5. Anticipate discharge in the next 24-48 hours 6. Outpatient follow-up with GI in 8 weeks (will make arrangements) 7. Supportive measures   Thank you for allowing [place] to participate in the care of [place], DNP, AGNP-C Adult & Gerontological Nurse Practitioner [facility]     LOS: 1 day    04/12/2019, 8:03 AM

## 2019-04-12 NOTE — Plan of Care (Signed)
  Problem: Acute Rehab PT Goals(only PT should resolve) Goal: Pt Will Go Supine/Side To Sit Outcome: Progressing Flowsheets (Taken 04/12/2019 1232) Pt will go Supine/Side to Sit: with supervision Goal: Patient Will Transfer Sit To/From Stand Outcome: Progressing Flowsheets (Taken 04/12/2019 1232) Patient will transfer sit to/from stand: with min guard assist Goal: Pt Will Transfer Bed To Chair/Chair To Bed Outcome: Progressing Flowsheets (Taken 04/12/2019 1232) Pt will Transfer Bed to Chair/Chair to Bed: with supervision Goal: Pt Will Ambulate Outcome: Progressing Flowsheets (Taken 04/12/2019 1232) Pt will Ambulate:  50 feet  with supervision  with cane Note: Quad-cane   12:33 PM, 04/12/19 Lonell Grandchild, MPT Physical Therapist with [facility] [phone] office [phone] mobile phone

## 2019-04-12 NOTE — Clinical Social Work Note (Signed)
Patient requires frequent re-positioning of the body in ways that cannot be achieved with an ordinary bed or wedge pillow, to eliminate pain, reduce pressure, and the head of the bed to be elevated more than 30 degrees most of the time due to persistent right arm contracture secondary to CVA, and Asthma.

## 2019-04-12 NOTE — Plan of Care (Signed)
?  Problem: Education: ?Goal: Knowledge of General Education information will improve ?Description: Including pain rating scale, medication(s)/side effects and non-pharmacologic comfort measures ?Outcome: Progressing ?  ?Problem: Health Behavior/Discharge Planning: ?Goal: Ability to manage health-related needs will improve ?Outcome: Progressing ?  ?Problem: Coping: ?Goal: Level of anxiety will decrease ?Outcome: Progressing ?  ?

## 2019-04-12 NOTE — Evaluation (Signed)
Physical Therapy Evaluation Patient Details Name: [name] MRN: [id number] DOB: 07/02/1956 Today's Date: 04/12/2019   History of Present Illness  [name]  is a 63 y.o. female, w hypertension, h/o stroke with R arm contracture, Asthma, ETOH abuse, Gerd, PUD,  apparently presents w/ c/o epigastric discomfort for the past 3 days.  Pt states sharp pain, associated with n/v,  Pt denies hematemesis.  Pt notes diarrhea, (chronic) 3-4 liquid stool per day slightly worse recently.  Pt denies brbpr.  Pt denies Nsaid use.  Pt has not drank recently.  Pt notes prior EGD 12/27/2017=> grade D reflux esophagitis, colonoscopy 10/03/2012=> diverticulosis    Clinical Impression  Patient functioning near baseline for functional mobility and gait, has most difficulty with sit to stands requiring assistance to stand from bedside and commode in bathroom, once on feet able to walk safely using Quad-cane, unsafe for using RW due to limited use of RUE due to contractures/weakness.  Patient tolerated sitting up in chair after therapy - RN/NT notified.  Patient will benefit from continued physical therapy in hospital and recommended venue below to increase strength, balance, endurance for safe ADLs and gait.     Follow Up Recommendations Home health PT;Supervision for mobility/OOB;Supervision/Assistance - 24 hour    Equipment Recommendations  None recommended by PT    Recommendations for Other Services       Precautions / Restrictions Precautions Precautions: Fall Restrictions Weight Bearing Restrictions: No      Mobility  Bed Mobility Overal bed mobility: Needs Assistance Bed Mobility: Supine to Sit     Supine to sit: Min assist     General bed mobility comments: labored movement, increased time and assistance for supine to sitting  Transfers Overall transfer level: Needs assistance Equipment used: Rolling walker (2 wheeled);Quad cane Transfers: Sit to/from [employer] to Stand: Min assist Stand pivot transfers: Min guard       General transfer comment: has difficulty sit to standing from commode in bathroom due to low, able to grip RLE with right hand, but has to lean over RW to reach  Ambulation/Gait Ambulation/Gait assistance: Min assist;Min guard Gait Distance (Feet): 20 Feet Assistive device: Rolling walker (2 wheeled);Quad cane Gait Pattern/deviations: Step-to pattern;Decreased step length - right;Decreased stance time - right;Decreased stride length Gait velocity: decreased   General Gait Details: slow labored cadence with mostly step-to pattern using Quad-cane without loss of balance, able to use RW to walk to doorway and back but has to lean over RW in order to grip with right hand due to shouder contracture and elow flexion contracture  Stairs            Wheelchair Mobility    Modified Rankin (Stroke Patients Only)       Balance Overall balance assessment: Needs assistance Sitting-balance support: Feet supported;No upper extremity supported Sitting balance-Leahy Scale: Good     Standing balance support: During functional activity;Single extremity supported Standing balance-Leahy Scale: Fair Standing balance comment: fair using Quad cane, fair/poor using RW due to limited weightbearing on RUE                             Pertinent Vitals/Pain Pain Assessment: No/denies pain    Home Living Family/patient expects to be discharged to:: Private residence Living Arrangements: Non-relatives/Friends Available Help at Discharge: Family;Personal care attendant;Available PRN/intermittently Type of Home: Mobile home Home Access: Ramped entrance     Home Layout: One level Home  Equipment: Latina Craver - 2 wheels;Shower seat;Bedside commode      Prior Function Level of Independence: Needs assistance   Gait / Transfers Assistance Needed: household ambulator using quad-cane  ADL's / Homemaking  Assistance Needed: home aide from 9 am to 5 pm x 5 days/week, live in female friend can help some        Hand Dominance        Extremity/Trunk Assessment   Upper Extremity Assessment Upper Extremity Assessment: Generalized weakness;RUE deficits/detail;LUE deficits/detail RUE Deficits / Details: grossly 2+/5 hand grip, 2/5 elbow flexion, contracture at shoulder LUE Deficits / Details: grossly 4/5    Lower Extremity Assessment Lower Extremity Assessment: Generalized weakness;RLE deficits/detail;LLE deficits/detail RLE Deficits / Details: grossly -4/5 LLE Deficits / Details: grossly 4+/5    Cervical / Trunk Assessment Cervical / Trunk Assessment: Kyphotic  Communication   Communication: No difficulties  Cognition Arousal/Alertness: Awake/alert Behavior During Therapy: WFL for tasks assessed/performed Overall Cognitive Status: Within Functional Limits for tasks assessed                                        General Comments      Exercises     Assessment/Plan    PT Assessment Patient needs continued PT services  PT Problem List Decreased strength;Decreased activity tolerance;Decreased balance;Decreased mobility       PT Treatment Interventions Gait training;Stair training;Functional mobility training;Therapeutic activities;Therapeutic exercise;Patient/family education    PT Goals (Current goals can be found in the Care Plan section)  Acute Rehab PT Goals Patient Stated Goal: return home with home aides and roommate to assist PT Goal Formulation: With patient Time For Goal Achievement: 04/19/19 Potential to Achieve Goals: Good    Frequency Min 3X/week   Barriers to discharge        Co-evaluation               AM-PAC PT "6 Clicks" Mobility  Outcome Measure Help needed turning from your back to your side while in a flat bed without using bedrails?: A Little Help needed moving from lying on your back to sitting on the side of a flat bed  without using bedrails?: A Little Help needed moving to and from a bed to a chair (including a wheelchair)?: A Little Help needed standing up from a chair using your arms (e.g., wheelchair or bedside chair)?: A Lot Help needed to walk in hospital room?: A Little Help needed climbing 3-5 steps with a railing? : A Lot 6 Click Score: 16    End of Session   Activity Tolerance: Patient tolerated treatment well;Patient limited by fatigue Patient left: in chair;with call bell/phone within reach;with chair alarm set Nurse Communication: Mobility status PT Visit Diagnosis: Unsteadiness on feet (R26.81);Other abnormalities of gait and mobility (R26.89);Muscle weakness (generalized) (M62.81)    Time: 9417-4081 PT Time Calculation (min) (ACUTE ONLY): 37 min   Charges:   PT Evaluation $PT Eval Moderate Complexity: 1 Mod PT Treatments $Therapeutic Activity: 23-37 mins        12:31 PM, 04/12/19 Lonell Grandchild, MPT Physical Therapist with [facility] 336 [phone] office [phone] mobile phone

## 2019-04-13 DIAGNOSIS — Z8673 Personal history of transient ischemic attack (TIA), and cerebral infarction without residual deficits: Secondary | ICD-10-CM

## 2019-04-13 DIAGNOSIS — E871 Hypo-osmolality and hyponatremia: Secondary | ICD-10-CM

## 2019-04-13 LAB — PHENYTOIN LEVEL, FREE AND TOTAL
Phenytoin, Free: NOT DETECTED ug/mL (ref 1.0–2.0)
Phenytoin, Total: <0.8 ug/mL — ABNORMAL LOW (ref 10.0–20.0)

## 2019-04-13 MED ORDER — PANTOPRAZOLE SODIUM 40 MG PO TBEC: 40.0000 mg | DELAYED_RELEASE_TABLET | Freq: Every day | ORAL | 1 refills | Status: DC

## 2019-04-13 MED ORDER — FOLIC ACID 1 MG PO TABS: 1.0000 mg | ORAL_TABLET | Freq: Every day | ORAL | 1 refills | Status: DC

## 2019-04-13 MED ORDER — THIAMINE HCL 100 MG PO TABS: 100.0000 mg | ORAL_TABLET | Freq: Every day | ORAL | 1 refills | Status: DC

## 2019-04-13 MED ORDER — ADULT MULTIVITAMIN W/MINERALS CH: 1.0000 | ORAL_TABLET | Freq: Every day | ORAL | 1 refills | Status: DC

## 2019-04-13 NOTE — Discharge Summary (Signed)
Physician Discharge Summary  VASILIA DISE KKX:[id number] DOB: Apr 25, 1956 DOA: 04/10/2019  PCP: [name], MD GI: [facility]  Admit date: 04/10/2019 Discharge date: 04/13/2019  Admitted From:  Home  Disposition: Home   Recommendations for Outpatient Follow-up:  1. Follow up with PCP in 1 weeks 2. Follow up with GI as scheduled  Discharge Condition: STABLE   CODE STATUS: FULL    Brief Hospitalization Summary: Please see all hospital notes, images, labs for full details of the hospitalization. Dr. Julianne Rice HPI:  [name]  is a 63 y.o. female, w hypertension, h/o stroke with R arm contracture, Asthma, ETOH abuse, Gerd, PUD,  apparently presents w/ c/o epigastric discomfort for the past 3 days.  Pt states sharp pain, associated with n/v,  Pt denies hematemesis.  Pt notes diarrhea, (chronic) 3-4 liquid stool per day slightly worse recently.  Pt denies brbpr.  Pt denies Nsaid use.  Pt has not drank recently.  Pt notes prior EGD 12/27/2017=> grade D reflux esophagitis, colonoscopy 10/03/2012=> diverticulosis  In ED,  T 98.3, P 104, R 18, Bp 156/73  Pox 98% Wt 64.9 kg  Wbc 6.5, hgb 11.8, Plt 182   Na 128, K 3.0, Bun 21, Creatinine 2.01  Glucose 103  CT abd/ pelvis IMPRESSION: 1. No acute intra-abdominal abnormality. 2. Cirrhosis. 3. Sigmoid diverticulosis without CT evidence of diverticulitis. 4. Normal appendix in the right lower quadrant. No hydronephrosis.  Pt will be admitted for epigastric pain, and diarrhea, and ARF.    Brief Admission Hx: 63 year old female with history of alcohol abuse, chronic liver disease, GERD, PUD, history of CVA with persistent right arm contracture, asthma presented with epigastric abdominal pain for 3 days prior to admission associated with 3-4 liquid stools per day which is more than her chronic diarrhea symptoms.  MDM/Assessment & Plan:   1. Dehydration secondary to worsening diarrhea symptoms- she responded well to IV fluid  hydration.  Her renal function is improved with hydration.  Eating and drinking better, continue soft diet for next week, then resume usual diet.  2. Hyponatremia-secondary to dehydration-resolved with IV fluids. 3. Epigastric abdominal pain- CT of the abdomen did not reveal any acute findings.  Clinically she seems to be slowly improving with supportive therapy.  Continue Protonix.  GI consult appreciated. 4. Acute on chronic diarrhea- C diff negative. Diarrhea resolved, holding linzess for now until she follow up with GI team.  Feeling better.   5. Hypokalemia- this is been repleted. 6. Acute renal failure-secondary to dehydration-creatinine improved with IV fluid hydration. 7. Epilepsy/cerebrovascular disease with history of stroke-continue Dilantin 100 mg 3 times daily.  8. History of asthma- stable and controlled with current HFA treatment. 9. Alcohol abuse-CIWA protocol in place, no withdrawal symptoms at this time.  10. Chronic constipation-hold Linzess temporarily due to loose stools and follow up with GI outpatient.   Patient requires frequent re-positioning of the body in ways that cannot be achieved with an ordinary bed or wedge pillow, to eliminate pain, reduce pressure, and the head of the bed to be elevated more than 30 degrees most of the time due topersistent right arm contracture secondary to CVA, and Asthma.  DVT prophylaxis: Lovenox/SCD Code Status: Full Family Communication:  Disposition Plan: Home with outpatient follow up    Consultants:  GI  Discharge Diagnoses:  Principal Problem:   ARF (acute renal failure) ([place]) Active Problems:   GERD (gastroesophageal reflux disease)   Abdominal pain   Hypokalemia   History of stroke  Diarrhea   Nausea, vomiting, and diarrhea   Discharge Instructions: Discharge Instructions    Call MD for:  difficulty breathing, headache or visual disturbances   Complete by: As directed    Call MD for:  persistant dizziness or  light-headedness   Complete by: As directed    Call MD for:  persistant nausea and vomiting   Complete by: As directed    Call MD for:  severe uncontrolled pain   Complete by: As directed    Increase activity slowly   Complete by: As directed      Allergies as of 04/13/2019   No Known Allergies     Medication List    STOP taking these medications   acetaminophen 325 MG tablet Commonly known as: TYLENOL   linaclotide 145 MCG Caps capsule Commonly known as: Linzess   ondansetron 4 MG disintegrating tablet Commonly known as: Zofran ODT     TAKE these medications   albuterol 108 (90 Base) MCG/ACT inhaler Commonly known as: VENTOLIN HFA Inhale 2 puffs into the lungs every 6 (six) hours as needed for wheezing or shortness of breath.   Ensure Plus Liqd Take 237 mLs by mouth 3 (three) times daily between meals.   folic acid 1 MG tablet Commonly known as: FOLVITE Take 1 tablet (1 mg total) by mouth daily. Start taking on: April 14, 2019   HYDROcodone-acetaminophen 5-325 MG tablet Commonly known as: NORCO/VICODIN One tablet by mouth every six hours as needed for pain.   ipratropium-albuterol 0.5-2.5 (3) MG/3ML Soln Commonly known as: DUONEB Take 3 mLs by nebulization every 6 (six) hours as needed (for shortness of breath/wheezing).   lidocaine 2 % solution Commonly known as: XYLOCAINE Using a syringe, 10 mL PO q4h or 30 MINS PRIOR TO MEALS AND PRN FOR ABDOMINAL/CHEST PAIN. NO MORE> 8 DOSES/DAY.   lisinopril 40 MG tablet Commonly known as: ZESTRIL Take 1 tablet by mouth daily.   multivitamin with minerals Tabs tablet Take 1 tablet by mouth daily. Start taking on: April 14, 2019   pantoprazole 40 MG tablet Commonly known as: PROTONIX Take 1 tablet (40 mg total) by mouth daily. [place] What changed: when to take this   phenytoin 100 MG ER capsule Commonly known as: DILANTIN Take 100 mg by mouth 3 (three) times daily.   thiamine 100 MG  tablet Take 1 tablet (100 mg total) by mouth daily. Start taking on: April 14, 2019            Durable Medical Equipment  (From admission, onward)         Start     Ordered   04/12/19 1358  For home use only DME Hospital bed  Once    Question Answer Comment  Length of Need Lifetime   The above medical condition requires: Patient requires the ability to reposition frequently   Head must be elevated greater than: 30 degrees   Bed type Semi-electric      04/12/19 1357         Follow-up Information    [name], MD Follow up on 04/25/2019.   Specialty: Internal Medicine Why: Wed at 10:00 for an in-person appointment with the Dr Minette Brine information: [place] [address] [phone]        Carlis Stable, NP. Schedule an appointment as soon as possible for a visit in 1 month(s).   Specialty: Gastroenterology Contact information: [address] [place] [address] [phone]  No Known Allergies Allergies as of 04/13/2019   No Known Allergies     Medication List    STOP taking these medications   acetaminophen 325 MG tablet Commonly known as: TYLENOL   linaclotide 145 MCG Caps capsule Commonly known as: Linzess   ondansetron 4 MG disintegrating tablet Commonly known as: Zofran ODT     TAKE these medications   albuterol 108 (90 Base) MCG/ACT inhaler Commonly known as: VENTOLIN HFA Inhale 2 puffs into the lungs every 6 (six) hours as needed for wheezing or shortness of breath.   Ensure Plus Liqd Take 237 mLs by mouth 3 (three) times daily between meals.   folic acid 1 MG tablet Commonly known as: FOLVITE Take 1 tablet (1 mg total) by mouth daily. Start taking on: April 14, 2019   HYDROcodone-acetaminophen 5-325 MG tablet Commonly known as: NORCO/VICODIN One tablet by mouth every six hours as needed for pain.   ipratropium-albuterol 0.5-2.5 (3) MG/3ML Soln Commonly known as: DUONEB Take 3 mLs by nebulization  every 6 (six) hours as needed (for shortness of breath/wheezing).   lidocaine 2 % solution Commonly known as: XYLOCAINE Using a syringe, 10 mL PO q4h or 30 MINS PRIOR TO MEALS AND PRN FOR ABDOMINAL/CHEST PAIN. NO MORE> 8 DOSES/DAY.   lisinopril 40 MG tablet Commonly known as: ZESTRIL Take 1 tablet by mouth daily.   multivitamin with minerals Tabs tablet Take 1 tablet by mouth daily. Start taking on: April 14, 2019   pantoprazole 40 MG tablet Commonly known as: PROTONIX Take 1 tablet (40 mg total) by mouth daily. [place] What changed: when to take this   phenytoin 100 MG ER capsule Commonly known as: DILANTIN Take 100 mg by mouth 3 (three) times daily.   thiamine 100 MG tablet Take 1 tablet (100 mg total) by mouth daily. Start taking on: April 14, 2019            Durable Medical Equipment  (From admission, onward)         Start     Ordered   04/12/19 1358  For home use only DME Hospital bed  Once    Question Answer Comment  Length of Need Lifetime   The above medical condition requires: Patient requires the ability to reposition frequently   Head must be elevated greater than: 30 degrees   Bed type Semi-electric      04/12/19 1357          Procedures/Studies: Ct Abdomen Pelvis Wo Contrast  Result Date: 04/10/2019 CLINICAL DATA:  Abdominal pain.  History of pancreatitis. EXAM: CT ABDOMEN AND PELVIS WITHOUT CONTRAST TECHNIQUE: Multidetector CT imaging of the abdomen and pelvis was performed following the standard protocol without IV contrast. COMPARISON:  CT dated January 24, 2019. FINDINGS: Lower chest: No acute abnormality. Hepatobiliary: There is evidence of cirrhosis without a discrete hepatic mass, however evaluation is severely limited by lack of IV contrast. The gallbladder is unremarkable. Pancreas: Unremarkable. No pancreatic ductal dilatation or surrounding inflammatory changes. Spleen: Normal in size without focal abnormality.  Adrenals/Urinary Tract: Adrenal glands are unremarkable. Kidneys are normal, without renal calculi, focal lesion, or hydronephrosis. Bladder is unremarkable. Stomach/Bowel: There is sigmoid diverticulosis without CT evidence of diverticulitis. The stomach is unremarkable aside from a small hiatal hernia. There is no evidence of a small-bowel obstruction. There is a normal appearing appendix in the right lower quadrant. Vascular/Lymphatic: Aortic atherosclerosis. No enlarged abdominal or pelvic lymph nodes. Reproductive: Status post hysterectomy. No  adnexal masses. Other: No abdominal wall hernia or abnormality. No abdominopelvic ascites. Musculoskeletal: No fracture is seen. IMPRESSION: 1. No acute intra-abdominal abnormality. 2. Cirrhosis. 3. Sigmoid diverticulosis without CT evidence of diverticulitis. 4. Normal appendix in the right lower quadrant.  No hydronephrosis. Electronically Signed   By: Constance Holster M.D.   On: 04/10/2019 22:56   Mm 3d Screen Breast Bilateral  Result Date: 03/22/2019 CLINICAL DATA:  Screening. EXAM: DIGITAL SCREENING BILATERAL MAMMOGRAM WITH TOMO AND CAD COMPARISON:  Previous exam(s). ACR Breast Density Category b: There are scattered areas of fibroglandular density. FINDINGS: There are no findings suspicious for malignancy. Images were processed with CAD. IMPRESSION: No mammographic evidence of malignancy. A result letter of this screening mammogram will be mailed directly to the patient. RECOMMENDATION: Screening mammogram in one year. (Code:SM-B-01Y) BI-RADS CATEGORY  1: Negative. Electronically Signed   By: Lovey Newcomer M.D.   On: 03/22/2019 14:05   US Abdomen Limited Ruq  Result Date: 03/22/2019 CLINICAL DATA:  Follow-up cirrhosis EXAM: ULTRASOUND ABDOMEN LIMITED RIGHT UPPER QUADRANT COMPARISON:  01/22/2019 FINDINGS: Gallbladder: Gallbladder polyps are noted. No definitive cholelithiasis is seen. No pericholecystic fluid is noted. Common bile duct: Diameter: 2.1 mm. Liver:  Increase in echogenicity with mild nodularity consistent with underlying cirrhosis. No focal mass is seen. Portal vein is patent on color Doppler imaging with normal direction of blood flow towards the liver. IMPRESSION: Stable changes of cirrhosis. Gallbladder polyps without evidence of cholelithiasis. Electronically Signed   By: Inez Catalina M.D.   On: 03/22/2019 09:54      Subjective: Pt says that she is feeling much better and tolerating soft diet , no diarrhea.   Discharge Exam: Vitals:   04/12/19 2035 04/13/19 0208  BP: (!) 124/55 (!) 124/51  Pulse: 99 99  Resp: 18 17  Temp: 98.2 F (36.8 C) 97.9 F (36.6 C)  SpO2: 100% 100%   Vitals:   04/12/19 1426 04/12/19 2035 04/13/19 0208 04/13/19 0500  BP: (!) 178/87 (!) 124/55 (!) 124/51   Pulse: 99 99 99   Resp: 16 18 17    Temp: 98 F (36.7 C) 98.2 F (36.8 C) 97.9 F (36.6 C)   TempSrc: Oral Oral Oral   SpO2: 100% 100% 100%   Weight:    75 kg  Height:        General: Pt is alert, awake, not in acute distress Cardiovascular: RRR, S1/S2 +, no rubs, no gallops Respiratory: CTA bilaterally, no wheezing, no rhonchi Abdominal: Soft, NT, ND, bowel sounds + Extremities: no edema, no cyanosis   The results of significant diagnostics from this hospitalization (including imaging, microbiology, ancillary and laboratory) are listed below for reference.     Microbiology: Recent Results (from the past 240 hour(s))  SARS Coronavirus 2 (CEPHEID - Performed in [place] hospital lab), Hosp Order     Status: None   Collection Time: 04/11/19  1:44 AM   Specimen: Nasopharyngeal Swab  Result Value Ref Range Status   SARS Coronavirus 2 NEGATIVE NEGATIVE Final    Comment: (NOTE) If result is NEGATIVE SARS-CoV-2 target nucleic acids are NOT DETECTED. The SARS-CoV-2 RNA is generally detectable in upper and lower  respiratory specimens during the acute phase of infection. The lowest  concentration of SARS-CoV-2 viral copies this assay can  detect is 250  copies / mL. A negative result does not preclude SARS-CoV-2 infection  and should not be used as the sole basis for treatment or other  patient management decisions.  A negative result  may occur with  improper specimen collection / handling, submission of specimen other  than nasopharyngeal swab, presence of viral mutation(s) within the  areas targeted by this assay, and inadequate number of viral copies  (<250 copies / mL). A negative result must be combined with clinical  observations, patient history, and epidemiological information. If result is POSITIVE SARS-CoV-2 target nucleic acids are DETECTED. The SARS-CoV-2 RNA is generally detectable in upper and lower  respiratory specimens dur ing the acute phase of infection.  Positive  results are indicative of active infection with SARS-CoV-2.  Clinical  correlation with patient history and other diagnostic information is  necessary to determine patient infection status.  Positive results do  not rule out bacterial infection or co-infection with other viruses. If result is PRESUMPTIVE POSTIVE SARS-CoV-2 nucleic acids MAY BE PRESENT.   A presumptive positive result was obtained on the submitted specimen  and confirmed on repeat testing.  While 2019 novel coronavirus  (SARS-CoV-2) nucleic acids may be present in the submitted sample  additional confirmatory testing may be necessary for epidemiological  and / or clinical management purposes  to differentiate between  SARS-CoV-2 and other Sarbecovirus currently known to infect humans.  If clinically indicated additional testing with an alternate test  methodology [phone]) is advised. The SARS-CoV-2 RNA is generally  detectable in upper and lower respiratory sp ecimens during the acute  phase of infection. The expected result is Negative. Fact Sheet for Patients:  StrictlyIdeas.no Fact Sheet for Healthcare  Providers: BankingDealers.co.za This test is not yet approved or cleared by the [place] FDA and has been authorized for detection and/or diagnosis of SARS-CoV-2 by FDA under an Emergency Use Authorization (EUA).  This EUA will remain in effect (meaning this test can be used) for the duration of the COVID-19 declaration under Section 564(b)(1) of the Act, 21 U.S.C. section 360bbb-3(b)(1), unless the authorization is terminated or revoked sooner. Performed at [facility], [address], [place], [place] [address]   Gastrointestinal Panel by PCR , Stool     Status: None   Collection Time: 04/11/19  8:15 PM   Specimen: Stool  Result Value Ref Range Status   Campylobacter species NOT DETECTED NOT DETECTED Final   Plesimonas shigelloides NOT DETECTED NOT DETECTED Final   Salmonella species NOT DETECTED NOT DETECTED Final   Yersinia enterocolitica NOT DETECTED NOT DETECTED Final   Vibrio species NOT DETECTED NOT DETECTED Final   Vibrio cholerae NOT DETECTED NOT DETECTED Final   Enteroaggregative E coli (EAEC) NOT DETECTED NOT DETECTED Final   Enteropathogenic E coli (EPEC) NOT DETECTED NOT DETECTED Final   Enterotoxigenic E coli (ETEC) NOT DETECTED NOT DETECTED Final   Shiga like toxin producing E coli (STEC) NOT DETECTED NOT DETECTED Final   Shigella/Enteroinvasive E coli (EIEC) NOT DETECTED NOT DETECTED Final   Cryptosporidium NOT DETECTED NOT DETECTED Final   Cyclospora cayetanensis NOT DETECTED NOT DETECTED Final   Entamoeba histolytica NOT DETECTED NOT DETECTED Final   Giardia lamblia NOT DETECTED NOT DETECTED Final   Adenovirus F40/41 NOT DETECTED NOT DETECTED Final   Astrovirus NOT DETECTED NOT DETECTED Final   Norovirus GI/GII NOT DETECTED NOT DETECTED Final   Rotavirus A NOT DETECTED NOT DETECTED Final   Sapovirus (I, II, IV, and V) NOT DETECTED NOT DETECTED Final    Comment: Performed at [facility], [address] [place], [place]  [address]  C difficile quick scan w PCR reflex     Status: None   Collection  Time: 04/11/19  8:15 PM   Specimen: Stool  Result Value Ref Range Status   C Diff antigen NEGATIVE NEGATIVE Final   C Diff toxin NEGATIVE NEGATIVE Final   C Diff interpretation No C. difficile detected.  Final    Comment: Performed at [facility], [address], [place], [place] [address]     Labs: BNP (last 3 results) No results for input(s): BNP in the last 8760 hours. Basic Metabolic Panel: Recent Labs  Lab 04/10/19 1845 04/11/19 0841 04/12/19 0552  NA 128* 132* 137  K 3.0* 4.1 5.1  CL 78* 89* 101  CO2 30 29 25   GLUCOSE 103* 101* 105*  BUN 21 17 12   CREATININE 2.01* 1.10* 0.65  CALCIUM 9.5 8.9 8.3*  MG 2.0  --  1.7   Liver Function Tests: Recent Labs  Lab 04/10/19 1845 04/11/19 0841 04/12/19 0552  AST 82* 54* 41  ALT 31 24 19   ALKPHOS 126 107 82  BILITOT 1.7* 1.6* 1.1  PROT 9.5* 7.3 6.1*  ALBUMIN 4.4 3.3* 2.8*   Recent Labs  Lab 04/10/19 1845  LIPASE 36   No results for input(s): AMMONIA in the last 168 hours. CBC: Recent Labs  Lab 04/10/19 1845 04/11/19 0841 04/12/19 0552  WBC 6.5 3.7* 3.3*  HGB 11.8* 10.5* 9.3*  HCT 35.6* 31.7* 28.0*  MCV 95.2 95.8 97.2  PLT 182 122* 111*   Cardiac Enzymes: No results for input(s): CKTOTAL, CKMB, CKMBINDEX, TROPONINI in the last 168 hours. BNP: Invalid input(s): POCBNP CBG: No results for input(s): GLUCAP in the last 168 hours. D-Dimer No results for input(s): DDIMER in the last 72 hours. Hgb A1c No results for input(s): HGBA1C in the last 72 hours. Lipid Profile No results for input(s): CHOL, HDL, LDLCALC, TRIG, CHOLHDL, LDLDIRECT in the last 72 hours. Thyroid function studies No results for input(s): TSH, T4TOTAL, T3FREE, THYROIDAB in the last 72 hours.  Invalid input(s): FREET3 Anemia work up No results for input(s): VITAMINB12, FOLATE, FERRITIN, TIBC, IRON, RETICCTPCT in the last 72 hours. Urinalysis    Component Value  Date/Time   COLORURINE YELLOW 04/11/2019 1614   APPEARANCEUR CLEAR 04/11/2019 1614   LABSPEC 1.004 (L) 04/11/2019 1614   PHURINE 7.0 04/11/2019 1614   GLUCOSEU NEGATIVE 04/11/2019 1614   HGBUR NEGATIVE 04/11/2019 1614   [place] 04/11/2019 1614   KETONESUR NEGATIVE 04/11/2019 1614   PROTEINUR NEGATIVE 04/11/2019 1614   UROBILINOGEN 0.2 12/26/2014 1927   NITRITE NEGATIVE 04/11/2019 1614   LEUKOCYTESUR NEGATIVE 04/11/2019 1614   Sepsis Labs Invalid input(s): PROCALCITONIN,  WBC,  LACTICIDVEN Microbiology Recent Results (from the past 240 hour(s))  SARS Coronavirus 2 (CEPHEID - Performed in [place] hospital lab), Hosp Order     Status: None   Collection Time: 04/11/19  1:44 AM   Specimen: Nasopharyngeal Swab  Result Value Ref Range Status   SARS Coronavirus 2 NEGATIVE NEGATIVE Final    Comment: (NOTE) If result is NEGATIVE SARS-CoV-2 target nucleic acids are NOT DETECTED. The SARS-CoV-2 RNA is generally detectable in upper and lower  respiratory specimens during the acute phase of infection. The lowest  concentration of SARS-CoV-2 viral copies this assay can detect is 250  copies / mL. A negative result does not preclude SARS-CoV-2 infection  and should not be used as the sole basis for treatment or other  patient management decisions.  A negative result may occur with  improper specimen collection / handling, submission of specimen other  than nasopharyngeal swab, presence of viral  mutation(s) within the  areas targeted by this assay, and inadequate number of viral copies  (<250 copies / mL). A negative result must be combined with clinical  observations, patient history, and epidemiological information. If result is POSITIVE SARS-CoV-2 target nucleic acids are DETECTED. The SARS-CoV-2 RNA is generally detectable in upper and lower  respiratory specimens dur ing the acute phase of infection.  Positive  results are indicative of active infection with SARS-CoV-2.   Clinical  correlation with patient history and other diagnostic information is  necessary to determine patient infection status.  Positive results do  not rule out bacterial infection or co-infection with other viruses. If result is PRESUMPTIVE POSTIVE SARS-CoV-2 nucleic acids MAY BE PRESENT.   A presumptive positive result was obtained on the submitted specimen  and confirmed on repeat testing.  While 2019 novel coronavirus  (SARS-CoV-2) nucleic acids may be present in the submitted sample  additional confirmatory testing may be necessary for epidemiological  and / or clinical management purposes  to differentiate between  SARS-CoV-2 and other Sarbecovirus currently known to infect humans.  If clinically indicated additional testing with an alternate test  methodology [phone]) is advised. The SARS-CoV-2 RNA is generally  detectable in upper and lower respiratory sp ecimens during the acute  phase of infection. The expected result is Negative. Fact Sheet for Patients:  StrictlyIdeas.no Fact Sheet for Healthcare Providers: BankingDealers.co.za This test is not yet approved or cleared by the [place] FDA and has been authorized for detection and/or diagnosis of SARS-CoV-2 by FDA under an Emergency Use Authorization (EUA).  This EUA will remain in effect (meaning this test can be used) for the duration of the COVID-19 declaration under Section 564(b)(1) of the Act, 21 U.S.C. section 360bbb-3(b)(1), unless the authorization is terminated or revoked sooner. Performed at [facility], [address], [place], [place] [address]   Gastrointestinal Panel by PCR , Stool     Status: None   Collection Time: 04/11/19  8:15 PM   Specimen: Stool  Result Value Ref Range Status   Campylobacter species NOT DETECTED NOT DETECTED Final   Plesimonas shigelloides NOT DETECTED NOT DETECTED Final   Salmonella species NOT DETECTED NOT DETECTED Final    Yersinia enterocolitica NOT DETECTED NOT DETECTED Final   Vibrio species NOT DETECTED NOT DETECTED Final   Vibrio cholerae NOT DETECTED NOT DETECTED Final   Enteroaggregative E coli (EAEC) NOT DETECTED NOT DETECTED Final   Enteropathogenic E coli (EPEC) NOT DETECTED NOT DETECTED Final   Enterotoxigenic E coli (ETEC) NOT DETECTED NOT DETECTED Final   Shiga like toxin producing E coli (STEC) NOT DETECTED NOT DETECTED Final   Shigella/Enteroinvasive E coli (EIEC) NOT DETECTED NOT DETECTED Final   Cryptosporidium NOT DETECTED NOT DETECTED Final   Cyclospora cayetanensis NOT DETECTED NOT DETECTED Final   Entamoeba histolytica NOT DETECTED NOT DETECTED Final   Giardia lamblia NOT DETECTED NOT DETECTED Final   Adenovirus F40/41 NOT DETECTED NOT DETECTED Final   Astrovirus NOT DETECTED NOT DETECTED Final   Norovirus GI/GII NOT DETECTED NOT DETECTED Final   Rotavirus A NOT DETECTED NOT DETECTED Final   Sapovirus (I, II, IV, and V) NOT DETECTED NOT DETECTED Final    Comment: Performed at [facility], [address] [place], [place] [address]  C difficile quick scan w PCR reflex     Status: None   Collection Time: 04/11/19  8:15 PM   Specimen: Stool  Result Value Ref Range Status   C Diff antigen  NEGATIVE NEGATIVE Final   C Diff toxin NEGATIVE NEGATIVE Final   C Diff interpretation No C. difficile detected.  Final    Comment: Performed at [facility], [address], [place], [place] [address]    Time coordinating discharge: 34 minutes   SIGNED:  Irwin Brakeman, MD  [facility] 04/13/2019, 10:59 AM How to contact the [facility] Attending or Consulting provider [place] or covering provider during after hours [place], for this patient?  1. Check the care team in [facility] and look for a) attending/consulting [facility] provider listed and b) the [facility] team listed 2. Log into [email or web address] and use [place]'s universal password to access. If you do not have the password, please contact the  hospital operator. 3. Locate the [facility] provider you are looking for under [facility] and page to a number that you can be directly reached. 4. If you still have difficulty reaching the provider, please page the [facility] (Director on Call) for the Hospitalists listed on amion for assistance.

## 2019-04-13 NOTE — Discharge Instructions (Signed)
Diarrhea, Adult Diarrhea is when you pass loose and watery poop (stool) often. Diarrhea can make you feel weak and cause you to lose water in your body (get dehydrated). Losing water in your body can cause you to:  Feel tired and thirsty.  Have a dry mouth.  Go pee (urinate) less often. Diarrhea often lasts 2-3 days. However, it can last longer if it is a sign of something more serious. It is important to treat your diarrhea as told by your doctor. Follow these instructions at home: Eating and drinking     Follow these instructions as told by your doctor:  Take an ORS (oral rehydration solution). This is a drink that helps you replace fluids and minerals your body lost. It is sold at pharmacies and stores.  Drink plenty of fluids, such as: ? Water. ? Ice chips. ? Diluted fruit juice. ? Low-calorie sports drinks. ? Milk, if you want.  Avoid drinking fluids that have a lot of sugar or caffeine in them.  Eat bland, easy-to-digest foods in small amounts as you are able. These foods include: ? Bananas. ? Applesauce. ? Rice. ? Low-fat (lean) meats. ? Toast. ? Crackers.  Avoid alcohol.  Avoid spicy or fatty foods.  Medicines  Take over-the-counter and prescription medicines only as told by your doctor.  If you were prescribed an antibiotic medicine, take it as told by your doctor. Do not stop using the antibiotic even if you start to feel better. General instructions   Wash your hands often using soap and water. If soap and water are not available, use a hand sanitizer. Others in your home should wash their hands as well. Hands should be washed: ? After using the toilet or changing a diaper. ? Before preparing, cooking, or serving food. ? While caring for a sick person. ? While visiting someone in a hospital.  Drink enough fluid to keep your pee (urine) pale yellow.  Rest at home while you get better.  Watch your condition for any changes.  Take a warm bath to  help with any burning or pain from having diarrhea.  Keep all follow-up visits as told by your doctor. This is important. Contact a doctor if:  You have a fever.  Your diarrhea gets worse.  You have new symptoms.  You cannot keep fluids down.  You feel light-headed or dizzy.  You have a headache.  You have muscle cramps. Get help right away if:  You have chest pain.  You feel very weak or you pass out (faint).  You have bloody or black poop or poop that looks like tar.  You have very bad pain, cramping, or bloating in your belly (abdomen).  You have trouble breathing or you are breathing very quickly.  Your heart is beating very quickly.  Your skin feels cold and clammy.  You feel confused.  You have signs of losing too much water in your body, such as: ? Dark pee, very little pee, or no pee. ? Cracked lips. ? Dry mouth. ? Sunken eyes. ? Sleepiness. ? Weakness. Summary  Diarrhea is when you pass loose and watery poop (stool) often.  Diarrhea can make you feel weak and cause you to lose water in your body (get dehydrated).  Take an ORS (oral rehydration solution). This is a drink that is sold at pharmacies and stores.  Eat bland, easy-to-digest foods in small amounts as you are able.  Contact a doctor if your condition gets worse. Get help  right away if you have signs that you have lost too much water in your body. This information is not intended to replace advice given to you by your health care provider. Make sure you discuss any questions you have with your health care provider. Document Released: 03/22/2008 Document Revised: 03/10/2018 Document Reviewed: 03/10/2018 Elsevier Patient Education  2020 [place] to Help Relieve Diarrhea, Adult When you have diarrhea, the foods you eat and your eating habits are very important. Choosing the right foods and drinks can help:  Relieve diarrhea.  Replace lost fluids and  nutrients.  Prevent dehydration. What general guidelines should I follow?  Relieving diarrhea  Choose foods with less than 2 g or .07 oz. of fiber per serving.  Limit fats to less than 8 tsp (38 g or 1.34 oz.) a day.  Avoid the following: ? Foods and beverages sweetened with high-fructose corn syrup, honey, or sugar alcohols such as xylitol, sorbitol, and mannitol. ? Foods that contain a lot of fat or sugar. ? Fried, greasy, or spicy foods. ? High-fiber grains, breads, and cereals. ? Raw fruits and vegetables.  Eat foods that are rich in probiotics. These foods include dairy products such as yogurt and fermented milk products. They help increase healthy bacteria in the stomach and intestines (gastrointestinal tract, or GI tract).  If you have lactose intolerance, avoid dairy products. These may make your diarrhea worse.  Take medicine to help stop diarrhea (antidiarrheal medicine) only as told by your health care provider. Replacing nutrients  Eat small meals or snacks every 3-4 hours.  Eat bland foods, such as white rice, toast, or baked potato, until your diarrhea starts to get better. Gradually reintroduce nutrient-rich foods as tolerated or as told by your health care provider. This includes: ? Well-cooked protein foods. ? Peeled, seeded, and soft-cooked fruits and vegetables. ? Low-fat dairy products.  Take vitamin and mineral supplements as told by your health care provider. Preventing dehydration  Start by sipping water or a special solution to prevent dehydration (oral rehydration solution, ORS). Urine that is clear or pale yellow means that you are getting enough fluid.  Try to drink at least 8-10 cups of fluid each day to help replace lost fluids.  You may add other liquids in addition to water, such as clear juice or decaffeinated sports drinks, as tolerated or as told by your health care provider.  Avoid drinks with caffeine, such as coffee, tea, or soft  drinks.  Avoid alcohol. What foods are recommended?     The items listed may not be a complete list. Talk with your health care provider about what dietary choices are best for you. Grains White rice. White, [place], or pita breads (fresh or toasted), including plain rolls, buns, or bagels. White pasta. Saltine, soda, or graham crackers. Pretzels. Low-fiber cereal. Cooked cereals made with water (such as cornmeal, farina, or cream cereals). Plain muffins. Matzo. Melba toast. Zwieback. Vegetables Potatoes (without the skin). Most well-cooked and canned vegetables without skins or seeds. Tender lettuce. Fruits Apple sauce. Fruits canned in juice. Cooked apricots, cherries, grapefruit, peaches, pears, or plums. Fresh bananas and cantaloupe. Meats and other protein foods Baked or boiled chicken. Eggs. Tofu. Fish. Seafood. Smooth nut butters. Ground or well-cooked tender beef, ham, veal, lamb, pork, or poultry. Dairy Plain yogurt, kefir, and unsweetened liquid yogurt. Lactose-free milk, buttermilk, skim milk, or soy milk. Low-fat or nonfat hard cheese. Beverages Water. Low-calorie sports drinks. Fruit juices without pulp. Strained tomato  and vegetable juices. Decaffeinated teas. Sugar-free beverages not sweetened with sugar alcohols. Oral rehydration solutions, if approved by your health care provider. Seasoning and other foods Bouillon, broth, or soups made from recommended foods. What foods are not recommended? The items listed may not be a complete list. Talk with your health care provider about what dietary choices are best for you. Grains Whole grain, whole wheat, bran, or rye breads, rolls, pastas, and crackers. Wild or brown rice. Whole grain or bran cereals. Barley. Oats and oatmeal. Corn tortillas or taco shells. Granola. Popcorn. Vegetables Raw vegetables. Fried vegetables. Cabbage, broccoli, Brussels sprouts, artichokes, baked beans, beet greens, corn, kale, legumes, peas, sweet  potatoes, and yams. Potato skins. Cooked spinach and cabbage. Fruits Dried fruit, including raisins and dates. Raw fruits. Stewed or dried prunes. Canned fruits with syrup. Meat and other protein foods Fried or fatty meats. Deli meats. Chunky nut butters. Nuts and seeds. Beans and lentils. Berniece Salines. Hot dogs. Sausage. Dairy High-fat cheeses. Whole milk, chocolate milk, and beverages made with milk, such as milk shakes. Half-and-half. Cream. sour cream. Ice cream. Beverages Caffeinated beverages (such as coffee, tea, soda, or energy drinks). Alcoholic beverages. Fruit juices with pulp. Prune juice. Soft drinks sweetened with high-fructose corn syrup or sugar alcohols. High-calorie sports drinks. Fats and oils Butter. Cream sauces. Margarine. Salad oils. Plain salad dressings. Olives. Avocados. Mayonnaise. Sweets and desserts Sweet rolls, doughnuts, and sweet breads. Sugar-free desserts sweetened with sugar alcohols such as xylitol and sorbitol. Seasoning and other foods Honey. Hot sauce. Chili powder. Gravy. Cream-based or milk-based soups. Pancakes and waffles. Summary  When you have diarrhea, the foods you eat and your eating habits are very important.  Make sure you get at least 8-10 cups of fluid each day, or enough to keep your urine clear or pale yellow.  Eat bland foods and gradually reintroduce healthy, nutrient-rich foods as tolerated, or as told by your health care provider.  Avoid high-fiber, fried, greasy, or spicy foods. This information is not intended to replace advice given to you by your health care provider. Make sure you discuss any questions you have with your health care provider. Document Released: 12/25/2003 Document Revised: 01/25/2019 Document Reviewed: 10/01/2016 Elsevier Patient Education  [place].  Viral Gastroenteritis, Adult  Viral gastroenteritis is also known as the stomach flu. This condition may affect your stomach, your small intestine, and your  large intestine. It can cause sudden watery poop (diarrhea), fever, and throwing up (vomiting). This condition is caused by certain germs (viruses). These germs can be passed from person to person very easily (are contagious). Having watery poop and throwing up can make you feel weak and cause you to not have enough water in your body (get dehydrated). This can make you tired and thirsty, make you have a dry mouth, and make it so you pee (urinate) less often. It is important to replace the fluids that you lose from having watery poop and throwing up. What are the causes?  You can get sick by catching viruses from other people.  You can also get sick by: ? Eating food, drinking water, or touching a surface that has the viruses on it (is contaminated). ? Sharing utensils or other personal items with a person who is sick. What increases the risk?  Having a weak body defense system (immune system).  Living with one or more children who are younger than 30 years old.  Living in a nursing home.  Going on cruise ships. What are  the signs or symptoms? Symptoms of this condition start suddenly. Symptoms may last for a few days or for as long as a week.  Common symptoms include: ? Watery poop. ? Throwing up.  Other symptoms include: ? Fever. ? Headache. ? Feeling tired (fatigue). ? Pain in the belly (abdomen). ? Chills. ? Feeling weak. ? Feeling sick to your stomach (nauseous). ? Muscle aches. ? Not feeling hungry. How is this treated?  This condition typically goes away on its own.  The focus of treatment is to replace the fluids that you lose. This condition may be treated with: ? An ORS (oral rehydration solution). This is a drink that is sold at pharmacies and stores. ? Medicines to help with your symptoms. ? Probiotic supplements to reduce symptoms of diarrhea. ? Fluids given through an IV tube, if needed.  Older adults and people with other diseases or a weak body defense  system are at higher risk for not having enough water in the body. Follow these instructions at home: Eating and drinking   Take an ORS as told by your doctor.  Drink clear fluids in small amounts as you are able. Clear fluids include: ? Water. ? Ice chips. ? Fruit juice with water added to it (diluted). ? Low-calorie sports drinks.  Drink enough fluid to keep your pee (urine) pale yellow.  Eat small amounts of healthy foods every 3-4 hours as you are able. This may include whole grains, fruits, vegetables, lean meats, and yogurt.  Avoid fluids that have a lot of sugar or caffeine in them, such as energy drinks, sports drinks, and soda.  Avoid spicy or fatty foods.  Avoid alcohol. General instructions   Wash your hands often. This is very important after you have watery poop or you throw up. If you cannot use soap and water, use hand sanitizer.  Make sure that all people in your home wash their hands well and often.  Take over-the-counter and prescription medicines only as told by your doctor.  Rest at home while you get better.  Watch your condition for any changes.  Take a warm bath to help with any burning or pain from having watery poop.  Keep all follow-up visits as told by your doctor. This is important. Contact a doctor if:  You cannot keep fluids down.  Your symptoms get worse.  You have new symptoms.  You feel light-headed.  You feel dizzy.  You have muscle cramps. Get help right away if:  You have chest pain.  You feel very weak.  You pass out (faint).  You see blood in your throw-up.  Your throw-up looks like coffee grounds.  You have bloody or black poop (stools) or poop that looks like tar.  You have a very bad headache, or a stiff neck, or both.  You have a rash.  You have very bad pain, cramping, or bloating in your belly.  You have trouble breathing.  You are breathing very quickly.  You have a fast heartbeat.  Your skin  feels cold and clammy.  You feel mixed up (confused).  You have pain when you pee.  You have signs of not having enough water in the body, such as: ? Dark pee, hardly any pee, or no pee. ? Cracked lips. ? Dry mouth. ? Sunken eyes. ? Feeling very sleepy. ? Feeling weak. Summary  Viral gastroenteritis is also known as the stomach flu.  This condition can cause sudden watery poop (diarrhea), fever, and throwing  up (vomiting).  These germs can be passed from person to person very easily.  Take an ORS as told by your doctor. This is a drink that is sold at pharmacies and stores.  Drink fluids in small amounts many times each day as you are able. This information is not intended to replace advice given to you by your health care provider. Make sure you discuss any questions you have with your health care provider. Document Released: 03/22/2008 Document Revised: 08/09/2018 Document Reviewed: 08/09/2018 Elsevier Patient Education  2020 [place] A soft-food eating plan includes foods that are safe and easy to chew and swallow. Your health care provider or dietitian can help you find foods and flavors that fit into this plan. Follow this plan until your health care provider or dietitian says it is safe to start eating other foods and food textures. What are tips for following this plan? General guidelines   Take small bites of food, or cut food into pieces about  inch or smaller. Bite-sized pieces of food are easier to chew and swallow.  Eat moist foods. Avoid overly dry foods.  Avoid foods that: ? Are difficult to swallow, such as dry, chunky, crispy, or sticky foods. ? Are difficult to chew, such as hard, tough, or stringy foods. ? Contain nuts, seeds, or fruits.  Follow instructions from your dietitian about the types of liquids that are safe for you to swallow. You may be allowed to have: ? Thick liquids only. This includes only liquids that are  thicker than honey. ? Thin and thick liquids. This includes all beverages and foods that become liquid at room temperature.  To make thick liquids: ? Purchase a commercial liquid thickening powder. These are available at grocery stores and pharmacies. ? Mix the thickener into liquids according to instructions on the label. ? Purchase ready-made thickened liquids. ? Thicken soup by pureeing, straining to remove chunks, and adding flour, potato flakes, or corn starch. ? Add commercial thickener to foods that become liquid at room temperature, such as milk shakes, yogurt, ice cream, gelatin, and sherbet.  Ask your health care provider whether you need to take a fiber supplement. Cooking  Cook meats so they stay tender and moist. Use methods like braising, stewing, or baking in liquid.  Cook vegetables and fruit until they are soft enough to be mashed with a fork.  Peel soft, fresh fruits such as peaches, nectarines, and melons.  When making soup, make sure chunks of meat and vegetables are smaller than  inch.  Reheat leftover foods slowly so that a tough crust does not form. What foods are allowed? The items listed below may not be a complete list. Talk with your dietitian about what dietary choices are best for you. Grains Breads, muffins, pancakes, or waffles moistened with syrup, jelly, or butter. Dry cereals well-moistened with milk. Moist, cooked cereals. Well-cooked pasta and rice. Vegetables All soft-cooked vegetables. Shredded lettuce. Fruits All canned and cooked fruits. Soft, peeled fresh fruits. Strawberries. Dairy Milk. Cream. Yogurt. Cottage cheese. Soft cheese without the rind. Meats and other protein foods Tender, moist ground meat, poultry, or fish. Meat cooked in gravy or sauces. Eggs. Sweets and desserts Ice cream. Milk shakes. Sherbet. Pudding. Fats and oils Butter. Margarine. Olive, canola, sunflower, and grapeseed oil. Smooth salad dressing. Smooth cream  cheese. Mayonnaise. Gravy. What foods are not allowed? The items listed bemay not be a complete list. Talk with your dietitian about what dietary choices are best  for you. Grains Coarse or dry cereals, such as bran, granola, and shredded wheat. Tough or chewy crusty breads, such as [place] bread or baguettes. Breads with nuts, seeds, or fruit. Vegetables All raw vegetables. Cooked corn. Cooked vegetables that are tough or stringy. Tough, crisp, fried potatoes and potato skins. Fruits Fresh fruits with skins or seeds, or both, such as apples, pears, and grapes. Stringy, high-pulp fruits, such as papaya, pineapple, coconut, and mango. Fruit leather and all dried fruit. Dairy Yogurt with nuts or coconut. Meats and other protein foods Hard, dry sausages. Dry meat, poultry, or fish. Meats with gristle. Fish with bones. Fried meat or fish. Lunch meat and hotdogs. Nuts and seeds. Chunky peanut butter or other nut butters. Sweets and desserts Cakes or cookies that are very dry or chewy. Desserts with dried fruit, nuts, or coconut. Fried pastries. Very rich pastries. Fats and oils Cream cheese with fruit or nuts. Salad dressings with seeds or chunks. Summary  A soft-food eating plan includes foods that are safe and easy to swallow. Generally, the foods should be soft enough to be mashed with a fork.  Avoid foods that are dry, hard to chew, crunchy, sticky, stringy, or crispy.  Ask your health care provider whether you need to thicken your liquids and if you need to take a fiber supplement. This information is not intended to replace advice given to you by your health care provider. Make sure you discuss any questions you have with your health care provider. Document Released: 01/11/2008 Document Revised: 01/25/2019 Document Reviewed: 12/07/2016 Elsevier Patient Education  2020 [place].  IMPORTANT INFORMATION: PAY CLOSE ATTENTION   PHYSICIAN DISCHARGE INSTRUCTIONS  Follow with Primary care  provider  [name], MD  and other consultants as instructed by your Hospitalist Physician  [place], WORSEN OR NEW PROBLEM DEVELOPS   Please note: You were cared for by a hospitalist during your hospital stay. Every effort will be made to forward records to your primary care provider.  You can request that your primary care provider send for your hospital records if they have not received them.  Once you are discharged, your primary care physician will handle any further medical issues. Please note that NO REFILLS for any discharge medications will be authorized once you are discharged, as it is imperative that you return to your primary care physician (or establish a relationship with a primary care physician if you do not have one) for your post hospital discharge needs so that they can reassess your need for medications and monitor your lab values.  Please get a complete blood count and chemistry panel checked by your Primary MD at your next visit, and again as instructed by your Primary MD.  Get Medicines reviewed and adjusted: Please take all your medications with you for your next visit with your Primary MD  Laboratory/radiological data: Please request your Primary MD to go over all hospital tests and procedure/radiological results at the follow up, please ask your primary care provider to get all Hospital records sent to his/her office.  In some cases, they will be blood work, cultures and biopsy results pending at the time of your discharge. Please request that your primary care provider follow up on these results.  If you are diabetic, please bring your blood sugar readings with you to your follow up appointment with primary care.    Please call and make your follow up appointments as soon as  possible.    Also Note the following: If you experience worsening of your admission symptoms, develop shortness of breath, life  threatening emergency, suicidal or homicidal thoughts you must seek medical attention immediately by calling 911 or calling your MD immediately  if symptoms less severe.  You must read complete instructions/literature along with all the possible adverse reactions/side effects for all the Medicines you take and that have been prescribed to you. Take any new Medicines after you have completely understood and accpet all the possible adverse reactions/side effects.   Do not drive when taking Pain medications or sleeping medications (Benzodiazepines)  Do not take more than prescribed Pain, Sleep and Anxiety Medications. It is not advisable to combine anxiety,sleep and pain medications without talking with your primary care practitioner  Special Instructions: If you have smoked or chewed Tobacco  in the last 2 yrs please stop smoking, stop any regular Alcohol  and or any Recreational drug use.  Wear Seat belts while driving.  Do not drive if taking any narcotic, mind altering or controlled substances or recreational drugs or alcohol.

## 2019-04-13 NOTE — TOC Initial Note (Signed)
Transition of Care [facility]) - Initial/Assessment Note    Patient Details  Name: [name] MRN: [id number] Date of Birth: 1955/11/03  Transition of Care [facility]) CM/SW Contact:    Trish Mage, LCSW Phone Number: 04/13/2019, 9:06 AM  Clinical Narrative:   Acute renal failure.  States she lives with a friend and has an Engineer, production in the home from [address]4P.  DMe includes cane, walker, BSC, shower chair.  States she was getting PT from [facility] earlier this spring, and is open to working with them again.  Pt requests hospital bed, rollator.  Have contacted [facility] about both and submitted required notes.                Expected Discharge Plan: [place] Barriers to Discharge: No Barriers Identified   Patient Goals and CMS Choice Patient states their goals for this hospitalization and ongoing recovery are:: "Get to feeling better."      Expected Discharge Plan and Services Expected Discharge Plan: [place] In-house Referral: NA Discharge Planning Services: CM Consult Post Acute Care Choice: Durable Medical Equipment Living arrangements for the past 2 months: Single Family Home                 DME Arranged: Hospital bed DME Agency: [employer] Date DME Agency Contacted: 04/12/19 Time DME Agency Contacted: 1303 Representative spoke with at DME Agency: [place]: [place]: [place] ([place]) Date [place]: 04/12/19 Time [place]: 1200 Representative spoke with at [place]: Vaughan Basta  Prior Living Arrangements/Services Living arrangements for the past 2 months: [place] with:: Roommate Patient language and need for interpreter reviewed:: Yes Do you feel safe going back to the place where you live?: Yes      Need for Family Participation in Patient Care: No (Comment) Care giver support system in place?: Yes (comment) Current home services: DME Criminal Activity/Legal Involvement Pertinent to  Current Situation/Hospitalization: No - Comment as needed  Activities of Daily Living Home Assistive Devices/Equipment: Nebulizer, Environmental consultant (specify type), Eyeglasses ADL Screening (condition at time of admission) Patient's cognitive ability adequate to safely complete daily activities?: Yes Is the patient deaf or have difficulty hearing?: No Does the patient have difficulty seeing, even when wearing glasses/contacts?: No Does the patient have difficulty concentrating, remembering, or making decisions?: No Patient able to express need for assistance with ADLs?: Yes Does the patient have difficulty dressing or bathing?: Yes Independently performs ADLs?: Yes (appropriate for developmental age) Does the patient have difficulty walking or climbing stairs?: Yes Weakness of Legs: Right Weakness of Arms/Hands: Right(contracture)  Permission Sought/Granted                  Emotional Assessment Appearance:: Appears stated age Attitude/Demeanor/Rapport: Engaged Affect (typically observed): Appropriate Orientation: : Oriented to Self, Oriented to Place, Oriented to  Time, Oriented to Situation Alcohol / Substance Use: Alcohol Use Psych Involvement: No (comment)  Admission diagnosis:  Hypokalemia [E87.6] Hyponatremia [E87.1] Generalized abdominal pain [R10.84] Nausea, vomiting, and diarrhea [R11.2, R19.7] Patient Active Problem List   Diagnosis Date Noted  . Diarrhea 04/11/2019  . Nausea, vomiting, and diarrhea   . ARF (acute renal failure) ([place]) 04/10/2019  . Acute kidney injury ([place]) 01/22/2019  . History of stroke 01/22/2019  . Asthma 01/22/2019  . Gastroenteritis 01/22/2019  . Stroke ([place]) 06/14/2018  . Cirrhosis of liver without ascites ([place]) 04/21/2017  . AKI (acute kidney injury) ([place]) 07/30/2016  .  Anemia 11/25/2015  . Reflux esophagitis   . Sinus tachycardia ([place]) 10/10/2014  . Hypokalemia 10/10/2014  . Seizure disorder ([place]) 10/10/2014  . Alcohol abuse 09/05/2012  .  GERD (gastroesophageal reflux disease) 09/05/2012  . Abdominal pain 09/05/2012  . FX CLOSED TIBIA NOS 07/27/2007  . STROKE-With also h/o Aneurysm clipping? 07/25/2007  . SEIZURES 07/25/2007   PCP:  [name], MD Pharmacy:   [facility], [address] [place] [address] Phone: [phone] Fax: [phone]     Social Determinants of Health (SDOH) Interventions    Readmission Risk Interventions No flowsheet data found.

## 2019-04-24 ENCOUNTER — Other Ambulatory Visit: Payer: Self-pay

## 2019-04-24 ENCOUNTER — Ambulatory Visit (INDEPENDENT_AMBULATORY_CARE_PROVIDER_SITE_OTHER): Payer: Medicare Other | Admitting: Orthopaedic Surgery

## 2019-04-24 ENCOUNTER — Telehealth: Payer: Self-pay | Admitting: Orthopaedic Surgery

## 2019-04-24 ENCOUNTER — Encounter: Payer: Self-pay | Admitting: Orthopaedic Surgery

## 2019-04-24 VITALS — BP 128/72 | HR 87 | Temp 97.0°F | Ht 64.0 in | Wt 165.2 lb

## 2019-04-24 DIAGNOSIS — G8929 Other chronic pain: Secondary | ICD-10-CM

## 2019-04-24 DIAGNOSIS — M25561 Pain in right knee: Secondary | ICD-10-CM

## 2019-04-24 DIAGNOSIS — R531 Weakness: Secondary | ICD-10-CM

## 2019-04-24 MED ORDER — HYDROCODONE-ACETAMINOPHEN 5-325 MG PO TABS: ORAL_TABLET | ORAL | 0 refills | Status: DC

## 2019-04-24 NOTE — Progress Notes (Signed)
CC:  I have pain of my right knee. I would like an injection.  The patient has chronic pain of the right knee.  There is no recent trauma.  There is no redness.  Injections in the past have helped.  The knee has no redness, has an effusion and crepitus present.  ROM of the right knee is 0-100.  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  Return: 2 months  PROCEDURE NOTE:  The patient requests injections of the right knee , verbal consent was obtained.  The right knee was prepped appropriately after time out was performed.   Sterile technique was observed and injection of 1 cc of Depo-Medrol 40 mg with several cc's of plain xylocaine. Anesthesia was provided by ethyl chloride and a 20-gauge needle was used to inject the knee area. The injection was tolerated well.  A band aid dressing was applied.  The patient was advised to apply ice later today and tomorrow to the injection sight as needed.  Electronically Signed Sanjuana Kava, MD 7/7/20209:06 AM

## 2019-04-24 NOTE — Telephone Encounter (Signed)
Patient states she forgot to ask for refill on Hydrocodone/Acetaminophen 5-325 mgs.  Qty  52  Sig: One tablet by mouth every six hours as needed for pain.        Patient states she uses [facility]

## 2019-06-15 ENCOUNTER — Other Ambulatory Visit: Payer: Self-pay

## 2019-06-15 ENCOUNTER — Encounter: Payer: Self-pay | Admitting: Nurse Practitioner

## 2019-06-15 ENCOUNTER — Ambulatory Visit (INDEPENDENT_AMBULATORY_CARE_PROVIDER_SITE_OTHER): Payer: Medicare Other | Admitting: Nurse Practitioner

## 2019-06-15 VITALS — BP 151/82 | HR 90 | Temp 96.6°F | Ht 64.0 in | Wt 169.0 lb

## 2019-06-15 DIAGNOSIS — K529 Noninfective gastroenteritis and colitis, unspecified: Secondary | ICD-10-CM

## 2019-06-15 DIAGNOSIS — D649 Anemia, unspecified: Secondary | ICD-10-CM | POA: Diagnosis not present

## 2019-06-15 DIAGNOSIS — K219 Gastro-esophageal reflux disease without esophagitis: Secondary | ICD-10-CM | POA: Diagnosis not present

## 2019-06-15 MED ORDER — PANTOPRAZOLE SODIUM 40 MG PO TBEC: 40.0000 mg | DELAYED_RELEASE_TABLET | Freq: Every day | ORAL | 3 refills | Status: DC

## 2019-06-15 NOTE — Patient Instructions (Addendum)
Your health issues we discussed today were:   GERD (reflux/heartburn) with nausea: 1. Continue taking Protonix (pantoprazole) which is your acid blocker to help your reflux symptoms.  I have sent a refill to your pharmacy 2. Take Zofran (ondansetron) for nausea as needed 3. Call us for any worsening or severe symptoms  Diarrhea: 1. I am glad your diarrhea has gone away 2. Continue taking Linzess for your chronic constipation. 3. Let us know if your diarrhea becomes worse or severe  Overall I recommend:  1. Continue your other current medications 2. Have your blood test when you're able to 3. Follow-up in 6 months 4. Call us if you have any questions or concerns.   Because of recent events of COVID-19 ("Coronavirus"), follow CDC recommendations:  1. Wash your hand frequently 2. Avoid touching your face 3. Stay away from people who are sick 4. If you have symptoms such as fever, cough, shortness of breath then call your healthcare provider for further guidance 5. If you are sick, STAY AT HOME unless otherwise directed by your healthcare provider. 6. Follow directions from state and national officials regarding staying safe   At [facility] we value your feedback. You may receive a survey about your visit today. Please share your experience as we strive to create trusting relationships with our patients to provide genuine, compassionate, quality care.  We appreciate your understanding and patience as we review any laboratory studies, imaging, and other diagnostic tests that are ordered as we care for you. Our office policy is 5 business days for review of these results, and any emergent or urgent results are addressed in a timely manner for your best interest. If you do not hear from our office in 1 week, please contact us.   We also encourage the use of MyChart, which contains your medical information for your review as well. If you are not enrolled in this feature, an  access code is on this after visit summary for your convenience. Thank you for allowing [place] to be involved in your care.  It was great to see you today!  I hope you have a great summer!!

## 2019-06-15 NOTE — Progress Notes (Addendum)
REVIEWED-NO ADDITIONAL RECOMMENDATIONS.  Referring Provider: [name], MD Primary Care Physician:  [name], MD Primary GI:  Dr. [name]  Chief Complaint  Patient presents with  . Hospitalization Follow-up    doing ok    HPI:   [name] is a 63 y.o. female who presents for hospital follow-up. The patient was admitted to the hospital from 04/10/2019 through 04/13/2019 when she presented for epigastric discomfort for the past 3 days associated with nausea and vomiting, diarrhea, chronic 3-4 stools a day that is slightly worse.  No recent EtOH.  Recent EGD in 2019 with grade D reflux esophagitis.  She was treated for dehydration secondary to worsening diarrhea which improved her renal function.  CT of the abdomen without any acute findings and clinically seem to be slowly improving with supportive therapy on continued Protonix.  C. difficile was negative, diarrhea resolved and holding Linzess for now until outpatient follow-up.  Recommended follow-up with GI as an outpatient.  She was last seen by GI in the hospital 04/12/2019 at which point she stated she felt good with no abdominal pain or vomiting.  Three loose stools a day prior and overall doing better and wanting solid food.  Her constellation of symptoms is likely viral gastroenteritis.  No findings to suggest acute decompensation of her liver disease.  Abdominal pain likely GERD/gastritis and gastroenteritis.  Recommend continue PPI upon discharge, follow-up for path panel, advance diet and outpatient follow-up in 8 weeks.  GI pathogen panel ended up resulting to negative.  Her last CBC 04/12/2019 with chronic anemia slightly decompensated to hemoglobin of 9.3.  Most recent CMP the same day found normal LFTs.  Creatinine improved to 0.65.  Today she states she's doing ok overall. Occasional loose stools which is back to baseline for her. No further GERD symptoms on PPI (Protonix). Occasional nausea well managed on "a pill"  (Zofran). Denies abdominal pain, hemeatochezia, melena, fever, chills, unintentional weight loss. Denies URI or flu-like symptoms. Denies loss of sense of taste or smell. Has poor energy, weakness. Denies syncope, near syncope, dizziness. Denies any other upper or lower GI symptoms.  Past Medical History:  Diagnosis Date  . Alcohol abuse   . Asthma   . Contracture of muscle of hand    right  . GERD (gastroesophageal reflux disease)   . HTN (hypertension)   . PUD (peptic ulcer disease)    remote  . Seizures ([place])    since stroke, no recent seizures  . Stroke [facility])    age 88, required brain surgery    Past Surgical History:  Procedure Laterality Date  . ABDOMINAL HYSTERECTOMY  2004   complete  . BRAIN SURGERY     age 38, stroke  . COLONOSCOPY    . COLONOSCOPY WITH PROPOFOL  10/03/2012   Dr. [name]: moderate diverticulosis, small internal hemorrhoids, TUBULAR ADENOMA. Next colonoscopy 2023 per Dr. [name].  . ESOPHAGOGASTRODUODENOSCOPY N/A 10/18/2014   Dr. Gala Romney during inpatient hospitalization: severe exudative/erosive reflux esophagitis as source of trivial upper GI bleed. No varices   . ESOPHAGOGASTRODUODENOSCOPY (EGD) WITH PROPOFOL  10/03/2012   Dr. [name]: stricture at [place] junction s/p dilation, mild gastritis negative H.pylori   . ESOPHAGOGASTRODUODENOSCOPY (EGD) WITH PROPOFOL N/A 12/27/2017   Procedure: ESOPHAGOGASTRODUODENOSCOPY (EGD) WITH PROPOFOL;  Surgeon: Danie Binder, MD;  Location: [facility] ENDO SUITE;  Service: Endoscopy;  Laterality: N/A;  10:15am  . right tib-fib fracture  2008  . SAVORY DILATION  10/03/2012   Procedure: SAVORY DILATION;  Surgeon: Carlyon Prows  Rexene Edison, MD;  Location: [facility] ORS;  Service: Endoscopy;  Laterality: N/A;  started at 1303,  dilated 12.8-16mm    Current Outpatient Medications  Medication Sig Dispense Refill  . acetaminophen (TYLENOL) 325 MG tablet Take 325-650 mg by mouth every 6 (six) hours as needed.    Marland Kitchen albuterol (PROVENTIL HFA;VENTOLIN HFA) 108  (90 BASE) MCG/ACT inhaler Inhale 2 puffs into the lungs every 6 (six) hours as needed for wheezing or shortness of breath.     Marland Kitchen albuterol (PROVENTIL) (2.5 MG/3ML) 0.083% nebulizer solution Take 2.5 mg by nebulization every 6 (six) hours as needed for wheezing or shortness of breath.    . ENSURE PLUS (ENSURE PLUS) LIQD Take 237 mLs by mouth 3 (three) times daily between meals.    . folic acid (FOLVITE) 1 MG tablet Take 1 tablet (1 mg total) by mouth daily. 30 tablet 1  . HYDROcodone-acetaminophen (NORCO/VICODIN) 5-325 MG tablet One tablet by mouth every six hours as needed for pain. 56 tablet 0  . ipratropium-albuterol (DUONEB) 0.5-2.5 (3) MG/3ML SOLN Take 3 mLs by nebulization every 6 (six) hours as needed (for shortness of breath/wheezing).     Marland Kitchen linaclotide (LINZESS) 145 MCG CAPS capsule Take 145 mcg by mouth daily before breakfast.    . lisinopril (ZESTRIL) 40 MG tablet Take 1 tablet by mouth daily.    . Multiple Vitamin (MULTIVITAMIN WITH MINERALS) TABS tablet Take 1 tablet by mouth daily. 30 tablet 1  . ondansetron (ZOFRAN) 4 MG/5ML solution Take 4 mg by mouth as needed for nausea or vomiting.    . pantoprazole (PROTONIX) 40 MG tablet Take 1 tablet (40 mg total) by mouth daily. 30 MINS PRIOR TO FIRST MEAL 30 tablet 1  . phenytoin (DILANTIN) 100 MG ER capsule Take 100 mg by mouth 3 (three) times daily.    Marland Kitchen thiamine 100 MG tablet Take 1 tablet (100 mg total) by mouth daily. 30 tablet 1   No current facility-administered medications for this visit.     Allergies as of 06/15/2019  . (No Known Allergies)    Family History  Problem Relation Age of Onset  . Liver disease Sister        etoh  . Colon cancer Neg Hx     Social History   Socioeconomic History  . Marital status: Single    Spouse name: Not on file  . Number of children: 3  . Years of education: Not on file  . Highest education level: Not on file  Occupational History  . Occupation: disability    Employer: UNEMPLOYED   Social Needs  . Financial resource strain: Not on file  . Food insecurity    Worry: Not on file    Inability: Not on file  . Transportation needs    Medical: Not on file    Non-medical: Not on file  Tobacco Use  . Smoking status: Former Smoker    Packs/day: 0.04    Years: 9.00    Pack years: 0.36    Types: Cigarettes    Quit date: 07/28/2014    Years since quitting: 4.8  . Smokeless tobacco: Never Used  Substance and Sexual Activity  . Alcohol use: Yes    Alcohol/week: 35.0 standard drinks    Types: 35 Cans of beer per week    Comment: "sometimes" 06/15/19  . Drug use: No  . Sexual activity: Yes    Birth control/protection: Surgical  Lifestyle  . Physical activity    Days per week: Not  on file    Minutes per session: Not on file  . Stress: Not on file  Relationships  . Social Herbalist on phone: Not on file    Gets together: Not on file    Attends religious service: Not on file    Active member of club or organization: Not on file    Attends meetings of clubs or organizations: Not on file    Relationship status: Not on file  Other Topics Concern  . 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 HAS 3 KIDS: ONE IN [place], ONE IN [place], AND ONE OOT.    Review of Systems: Complete ROS negative except as per HPI.   Physical Exam: BP (!) 151/82   Pulse 90   Temp (!) 96.6 F (35.9 C) (Temporal)   Ht 5\' 4"  (1.626 m)   Wt 169 lb (76.7 kg)   BMI 29.01 kg/m  General:   Alert and oriented. Pleasant and cooperative. Well-nourished and well-developed.  Eyes:  Without icterus, sclera clear and conjunctiva pink.  Ears:  Normal auditory acuity. Cardiovascular:  S1, S2 present without murmurs appreciated. Extremities without clubbing or edema. Respiratory:  Clear to auscultation bilaterally. No wheezes, rales, or rhonchi. No distress.  Gastrointestinal:  +BS, soft, non-tender and non-distended. No HSM noted. No guarding or rebound. No  masses appreciated.  Rectal:  Deferred  Musculoskalatal:  Symmetrical without gross deformities. Neurologic:  Alert and oriented x4;  grossly normal neurologically. Psych:  Alert and cooperative. Normal mood and affect. Heme/Lymph/Immune: No excessive bruising noted.    06/15/2019 10:58 AM   Disclaimer: This note was dictated with voice recognition software. Similar sounding words can inadvertently be transcribed and may not be corrected upon review.

## 2019-06-26 ENCOUNTER — Ambulatory Visit (INDEPENDENT_AMBULATORY_CARE_PROVIDER_SITE_OTHER): Payer: Medicare Other | Admitting: Orthopaedic Surgery

## 2019-06-26 ENCOUNTER — Encounter: Payer: Self-pay | Admitting: Orthopaedic Surgery

## 2019-06-26 ENCOUNTER — Telehealth: Payer: Self-pay

## 2019-06-26 ENCOUNTER — Other Ambulatory Visit: Payer: Self-pay

## 2019-06-26 DIAGNOSIS — G8929 Other chronic pain: Secondary | ICD-10-CM | POA: Diagnosis not present

## 2019-06-26 DIAGNOSIS — R531 Weakness: Secondary | ICD-10-CM

## 2019-06-26 DIAGNOSIS — I6789 Other cerebrovascular disease: Secondary | ICD-10-CM

## 2019-06-26 DIAGNOSIS — M25561 Pain in right knee: Secondary | ICD-10-CM

## 2019-06-26 MED ORDER — HYDROCODONE-ACETAMINOPHEN 5-325 MG PO TABS: ORAL_TABLET | ORAL | 0 refills | Status: DC

## 2019-06-26 NOTE — Telephone Encounter (Signed)
When I saw the patient her med list said once a day. If she's on bid dosing let's keep this for now given her symptoms were much improved on her current regimen. Can see her in follow-up and determine if there's an indication to reduce her dose.

## 2019-06-26 NOTE — Progress Notes (Signed)
PROCEDURE NOTE:  The patient requests injections of the right knee , verbal consent was obtained.  The right knee was prepped appropriately after time out was performed.   Sterile technique was observed and injection of 1 cc of Depo-Medrol 40 mg with several cc's of plain xylocaine. Anesthesia was provided by ethyl chloride and a 20-gauge needle was used to inject the knee area. The injection was tolerated well.  A band aid dressing was applied.  The patient was advised to apply ice later today and tomorrow to the injection sight as needed.  Return in two months.  I have reviewed the [place] web site prior to prescribing narcotic medicine for this patient.   Electronically Signed Sanjuana Kava, MD 9/8/20209:06 AM

## 2019-06-26 NOTE — Telephone Encounter (Signed)
Received fax from [facility], refill was sent for Protonix 40mg  po daily. Pharmacy states pt is already on Protonix twice daily, please advise.

## 2019-06-26 NOTE — Telephone Encounter (Signed)
Called pt, she is taking Protonix bid. EG advised to refill bid. EG wrote on fax and sent back to pharmacy.

## 2019-07-03 ENCOUNTER — Telehealth: Payer: Self-pay | Admitting: Gastroenterology

## 2019-07-03 MED ORDER — FOLIC ACID 1 MG PO TABS: 1.0000 mg | ORAL_TABLET | Freq: Every day | ORAL | 0 refills | Status: DC

## 2019-07-03 NOTE — Telephone Encounter (Signed)
Pt said that [facility] had sent a refill request on her Folic Acid yesterday and they haven't received anything from [place].

## 2019-07-03 NOTE — Telephone Encounter (Signed)
I do not see in refill box, forwarding to refill.

## 2019-07-03 NOTE — Addendum Note (Signed)
Addended by: Gordy Levan, Bethel Sirois A on: 07/03/2019 01:29 PM   Modules accepted: Orders

## 2019-07-03 NOTE — Telephone Encounter (Signed)
I will refill it, but we haven't really been prescribing this for her that I can see. She should follo-wup with PCP for further testing to see if it's still needed.

## 2019-07-03 NOTE — Telephone Encounter (Signed)
Pt's home health aid, [facility], was informed that pt needs to see PCP about this in the future.

## 2019-07-05 LAB — CBC WITH DIFFERENTIAL/PLATELET
Absolute Monocytes: 527 cells/uL (ref 200–950)
Basophils Absolute: 32 cells/uL (ref 0–200)
Basophils Relative: 0.7 %
Eosinophils Absolute: 41 cells/uL (ref 15–500)
Eosinophils Relative: 0.9 %
HCT: 32.8 % — ABNORMAL LOW (ref 35.0–45.0)
Hemoglobin: 11.1 g/dL — ABNORMAL LOW (ref 11.7–15.5)
Lymphs Abs: 1143 cells/uL (ref 850–3900)
MCH: 30.2 pg (ref 27.0–33.0)
MCHC: 33.8 g/dL (ref 32.0–36.0)
MCV: 89.1 fL (ref 80.0–100.0)
MPV: 12.3 fL (ref 7.5–12.5)
Monocytes Relative: 11.7 %
Neutro Abs: 2759 cells/uL (ref 1500–7800)
Neutrophils Relative %: 61.3 %
Platelets: 113 10*3/uL — ABNORMAL LOW (ref 140–400)
RBC: 3.68 10*6/uL — ABNORMAL LOW (ref 3.80–5.10)
RDW: 12.7 % (ref 11.0–15.0)
Total Lymphocyte: 25.4 %
WBC: 4.5 10*3/uL (ref 3.8–10.8)

## 2019-07-09 ENCOUNTER — Telehealth: Payer: Self-pay | Admitting: Orthopaedic Surgery

## 2019-07-09 NOTE — Telephone Encounter (Signed)
Patient called for refill: HYDROcodone-acetaminophen (NORCO/VICODIN) 5-325 MG tablet [address]   - [address]

## 2019-07-10 MED ORDER — HYDROCODONE-ACETAMINOPHEN 5-325 MG PO TABS: ORAL_TABLET | ORAL | 0 refills | Status: DC

## 2019-08-08 ENCOUNTER — Other Ambulatory Visit: Payer: Self-pay | Admitting: Orthopaedic Surgery

## 2019-08-27 ENCOUNTER — Telehealth: Payer: Self-pay | Admitting: Gastroenterology

## 2019-08-27 NOTE — Telephone Encounter (Signed)
Recall for ultrasound 

## 2019-08-27 NOTE — Telephone Encounter (Signed)
Recall sent 

## 2019-08-28 ENCOUNTER — Ambulatory Visit: Payer: Medicare Other | Admitting: Orthopaedic Surgery

## 2019-09-03 ENCOUNTER — Telehealth: Payer: Self-pay | Admitting: Gastroenterology

## 2019-09-03 DIAGNOSIS — K7030 Alcoholic cirrhosis of liver without ascites: Secondary | ICD-10-CM

## 2019-09-03 NOTE — Telephone Encounter (Signed)
RUQ scheduled for 10/03/2019 at 9:30am, arrival time 9:15am, npo after midnight.  Patient aware of details. Letter also mailed

## 2019-09-03 NOTE — Telephone Encounter (Signed)
Pt's caregiver called to scheduled RUQ U/S. Pt had received a letter. RE:[id number]

## 2019-09-04 ENCOUNTER — Other Ambulatory Visit: Payer: Self-pay

## 2019-09-04 ENCOUNTER — Encounter: Payer: Self-pay | Admitting: Orthopaedic Surgery

## 2019-09-04 ENCOUNTER — Ambulatory Visit (INDEPENDENT_AMBULATORY_CARE_PROVIDER_SITE_OTHER): Payer: Medicare Other | Admitting: Orthopaedic Surgery

## 2019-09-04 VITALS — Temp 97.3°F

## 2019-09-04 DIAGNOSIS — M25561 Pain in right knee: Secondary | ICD-10-CM

## 2019-09-04 DIAGNOSIS — G8929 Other chronic pain: Secondary | ICD-10-CM | POA: Diagnosis not present

## 2019-09-04 DIAGNOSIS — R531 Weakness: Secondary | ICD-10-CM

## 2019-09-04 MED ORDER — HYDROCODONE-ACETAMINOPHEN 5-325 MG PO TABS: ORAL_TABLET | ORAL | 0 refills | Status: DC

## 2019-09-04 NOTE — Progress Notes (Signed)
PROCEDURE NOTE:  The patient requests injections of the right knee , verbal consent was obtained.  The right knee was prepped appropriately after time out was performed.   Sterile technique was observed and injection of 1 cc of Depo-Medrol 40 mg with several cc's of plain xylocaine. Anesthesia was provided by ethyl chloride and a 20-gauge needle was used to inject the knee area. The injection was tolerated well.  A band aid dressing was applied.  The patient was advised to apply ice later today and tomorrow to the injection sight as needed.  I have reviewed the [place] web site prior to prescribing narcotic medicine for this patient.   Electronically Signed Sanjuana Kava, MD 11/17/202010:37 AM

## 2019-09-05 ENCOUNTER — Telehealth: Payer: Self-pay

## 2019-09-05 NOTE — Telephone Encounter (Signed)
Pt's CNA, Hope is aware and will have the paperwork faxed here. Forwarding to Manuela Schwartz to be on look out for the paperwork.

## 2019-09-05 NOTE — Telephone Encounter (Signed)
WILL SIGN PAPERWORK FOR [facility].

## 2019-09-05 NOTE — Telephone Encounter (Signed)
Vm form Tom with [facility] requesting an appointment time for pt to have mobility evaluation. His return phone number is [phone].  Dr. [name], please advise!

## 2019-09-05 NOTE — Telephone Encounter (Signed)
Called SUSAN TO DISCUSS concerns. SHE WILL FAX PACKET. PT CAN COME TO [facility] TO BE SEEN NOV 25 @1130 . PLEASE CALL PT AND LET HER KNOW.

## 2019-09-05 NOTE — Telephone Encounter (Addendum)
Pt's CNA, [facility], called and asked if Dr. [name] would sign papers for pt to have a [facility]. I told her PCP usually takes care of that and she said Dr. Legrand Rams refused to do it. They spoke to someone at [facility] and was told to ask Dr. [name] and if she will do it they will fax [place] the paperwork. Call back number is [phone].  Forwarding to Dr. [name] to advise.

## 2019-09-05 NOTE — Telephone Encounter (Signed)
OV made, forms are in [facility] office

## 2019-09-05 NOTE — Telephone Encounter (Signed)
[facility] called on 3 way with patient's aide on other line wanting to schedule OV for wheelchair evaluation. Please advise.

## 2019-09-09 ENCOUNTER — Emergency Department (HOSPITAL_COMMUNITY)
Admission: EM | Admit: 2019-09-09 | Discharge: 2019-09-09 | Disposition: A | Discharge status: Home / Self Care | Payer: Medicare Other | Attending: Emergency Medicine | Admitting: Emergency Medicine

## 2019-09-09 ENCOUNTER — Other Ambulatory Visit: Payer: Self-pay

## 2019-09-09 ENCOUNTER — Encounter (HOSPITAL_COMMUNITY): Payer: Self-pay | Admitting: Emergency Medicine

## 2019-09-09 DIAGNOSIS — Y9389 Activity, other specified: Secondary | ICD-10-CM | POA: Insufficient documentation

## 2019-09-09 DIAGNOSIS — X58XXXA Exposure to other specified factors, initial encounter: Secondary | ICD-10-CM | POA: Diagnosis not present

## 2019-09-09 DIAGNOSIS — I10 Essential (primary) hypertension: Secondary | ICD-10-CM | POA: Diagnosis not present

## 2019-09-09 DIAGNOSIS — Y929 Unspecified place or not applicable: Secondary | ICD-10-CM | POA: Insufficient documentation

## 2019-09-09 DIAGNOSIS — Z79899 Other long term (current) drug therapy: Secondary | ICD-10-CM | POA: Insufficient documentation

## 2019-09-09 DIAGNOSIS — J45909 Unspecified asthma, uncomplicated: Secondary | ICD-10-CM | POA: Diagnosis not present

## 2019-09-09 DIAGNOSIS — Z87891 Personal history of nicotine dependence: Secondary | ICD-10-CM | POA: Diagnosis not present

## 2019-09-09 DIAGNOSIS — Z8673 Personal history of transient ischemic attack (TIA), and cerebral infarction without residual deficits: Secondary | ICD-10-CM | POA: Insufficient documentation

## 2019-09-09 DIAGNOSIS — Y999 Unspecified external cause status: Secondary | ICD-10-CM | POA: Diagnosis not present

## 2019-09-09 DIAGNOSIS — T180XXA Foreign body in mouth, initial encounter: Secondary | ICD-10-CM | POA: Insufficient documentation

## 2019-09-09 DIAGNOSIS — M795 Residual foreign body in soft tissue: Secondary | ICD-10-CM

## 2019-09-09 NOTE — ED Triage Notes (Signed)
[facility] - pt was eating walnuts when she developed two knots under her tongue.

## 2019-09-09 NOTE — Discharge Instructions (Signed)
Please follow up with your primary care doctor for any new or worsening symptoms

## 2019-09-09 NOTE — ED Provider Notes (Signed)
[facility] EMERGENCY DEPARTMENT Provider Note   CSN: [id number] Arrival date & time: 09/09/19  2009     History   Chief Complaint Chief Complaint  Patient presents with  . Knots under tongue    HPI [name] is a 63 y.o. female with a past medical history of alcohol abuse, asthma, status post CVA with right hand contracture.  Patient is a stupendously poor historian. She states that she was eating walnut when she "got some knots under my tongue."  She denies any wheezing.  She denies difficulty swallowing.  She states that this is happened once before when she was eating walnuts.      HPI  Past Medical History:  Diagnosis Date  . Alcohol abuse   . Asthma   . Contracture of muscle of hand    right  . GERD (gastroesophageal reflux disease)   . HTN (hypertension)   . PUD (peptic ulcer disease)    remote  . Seizures ([place])    since stroke, no recent seizures  . Stroke [facility])    age 51, required brain surgery    Patient Active Problem List   Diagnosis Date Noted  . Hyponatremia   . Diarrhea 04/11/2019  . Nausea, vomiting, and diarrhea   . ARF (acute renal failure) ([place]) 04/10/2019  . Acute kidney injury ([place]) 01/22/2019  . History of stroke 01/22/2019  . Asthma 01/22/2019  . Gastroenteritis 01/22/2019  . Stroke ([place]) 06/14/2018  . Cirrhosis of liver without ascites ([place]) 04/21/2017  . AKI (acute kidney injury) ([place]) 07/30/2016  . Anemia 11/25/2015  . Reflux esophagitis   . Sinus tachycardia ([place]) 10/10/2014  . Hypokalemia 10/10/2014  . Seizure disorder ([place]) 10/10/2014  . Alcohol abuse 09/05/2012  . GERD (gastroesophageal reflux disease) 09/05/2012  . Abdominal pain 09/05/2012  . FX CLOSED TIBIA NOS 07/27/2007  . STROKE-With also h/o Aneurysm clipping? 07/25/2007  . SEIZURES 07/25/2007    Past Surgical History:  Procedure Laterality Date  . ABDOMINAL HYSTERECTOMY  2004   complete  . BRAIN SURGERY     age 23, stroke  . COLONOSCOPY    .  COLONOSCOPY WITH PROPOFOL  10/03/2012   Dr. [name]: moderate diverticulosis, small internal hemorrhoids, TUBULAR ADENOMA. Next colonoscopy 2023 per Dr. [name].  . ESOPHAGOGASTRODUODENOSCOPY N/A 10/18/2014   Dr. Gala Romney during inpatient hospitalization: severe exudative/erosive reflux esophagitis as source of trivial upper GI bleed. No varices   . ESOPHAGOGASTRODUODENOSCOPY (EGD) WITH PROPOFOL  10/03/2012   Dr. [name]: stricture at [place] junction s/p dilation, mild gastritis negative H.pylori   . ESOPHAGOGASTRODUODENOSCOPY (EGD) WITH PROPOFOL N/A 12/27/2017   Procedure: ESOPHAGOGASTRODUODENOSCOPY (EGD) WITH PROPOFOL;  Surgeon: Danie Binder, MD;  Location: [facility] ENDO SUITE;  Service: Endoscopy;  Laterality: N/A;  10:15am  . right tib-fib fracture  2008  . SAVORY DILATION  10/03/2012   Procedure: SAVORY DILATION;  Surgeon: Danie Binder, MD;  Location: [facility] ORS;  Service: Endoscopy;  Laterality: N/A;  started at 1303,  dilated 12.8-16mm     OB History    Gravida  4   Para  3   Term  3   Preterm      AB  1   Living  3     SAB  1   TAB      Ectopic      Multiple      Live Births               Home Medications    Prior  to Admission medications   Medication Sig Start Date End Date Taking? Authorizing Provider  acetaminophen (TYLENOL) 325 MG tablet Take 325-650 mg by mouth every 6 (six) hours as needed.    [provider]  albuterol (PROVENTIL HFA;VENTOLIN HFA) 108 (90 BASE) MCG/ACT inhaler Inhale 2 puffs into the lungs every 6 (six) hours as needed for wheezing or shortness of breath.     [provider]  albuterol (PROVENTIL) (2.5 MG/3ML) 0.083% nebulizer solution Take 2.5 mg by nebulization every 6 (six) hours as needed for wheezing or shortness of breath.    [provider]  ENSURE PLUS (ENSURE PLUS) LIQD Take 237 mLs by mouth 3 (three) times daily between meals.    [provider]  folic acid (FOLVITE) 1 MG tablet Take 1 tablet (1 mg total)  by mouth daily. 07/03/19   Carlis Stable, NP  HYDROcodone-acetaminophen (NORCO/VICODIN) 5-325 MG tablet TAKE (1) TABLET BY MOUTH EVERY (6) HOURS AS NEEDED FOR MODERATE PAIN. 09/04/19   Sanjuana Kava, MD  ipratropium-albuterol (DUONEB) 0.5-2.5 (3) MG/3ML SOLN Take 3 mLs by nebulization every 6 (six) hours as needed (for shortness of breath/wheezing).     [provider]  linaclotide (LINZESS) 145 MCG CAPS capsule Take 145 mcg by mouth daily before breakfast.    [provider]  lisinopril (ZESTRIL) 40 MG tablet Take 1 tablet by mouth daily. 04/05/19   [provider]  Multiple Vitamin (MULTIVITAMIN WITH MINERALS) TABS tablet Take 1 tablet by mouth daily. 04/14/19   Johnson, Clanford L, MD  ondansetron (ZOFRAN) 4 MG/5ML solution Take 4 mg by mouth as needed for nausea or vomiting.    [provider]  pantoprazole (PROTONIX) 40 MG tablet Take 1 tablet (40 mg total) by mouth daily. [place] 06/15/19   Carlis Stable, NP  phenytoin (DILANTIN) 100 MG ER capsule Take 100 mg by mouth 3 (three) times daily.    [provider]  thiamine 100 MG tablet Take 1 tablet (100 mg total) by mouth daily. 04/14/19   Murlean Iba, MD    Family History Family History  Problem Relation Age of Onset  . Liver disease Sister        etoh  . Colon cancer Neg Hx     Social History Social History   Tobacco Use  . Smoking status: Former Smoker    Packs/day: 0.04    Years: 9.00    Pack years: 0.36    Types: Cigarettes    Quit date: 07/28/2014    Years since quitting: 5.1  . Smokeless tobacco: Never Used  Substance Use Topics  . Alcohol use: Yes    Alcohol/week: 35.0 standard drinks    Types: 35 Cans of beer per week    Comment: "sometimes" 06/15/19  . Drug use: No     Allergies   Patient has no known allergies.   Review of Systems Review of Systems  Ten systems reviewed and are negative for acute change, except as noted in the HPI.    Physical Exam Updated Vital Signs BP 129/65   Pulse 86   Temp 97.8 F (36.6 C)   Resp 18   Ht 5\' 4"  (1.626 m)   Wt 76.7 kg   SpO2 99%   BMI 29.01 kg/m   Physical Exam Vitals signs and nursing note reviewed.  Constitutional:      General: She is not in acute distress.    Appearance: She is well-developed. She is  not diaphoretic.  HENT:     Head: Normocephalic and atraumatic.     Mouth/Throat:   Eyes:     General: No scleral icterus.    Conjunctiva/sclera: Conjunctivae normal.  Neck:     Musculoskeletal: Normal range of motion.  Cardiovascular:     Rate and Rhythm: Normal rate and regular rhythm.     Heart sounds: Normal heart sounds. No murmur. No friction rub. No gallop.   Pulmonary:     Effort: Pulmonary effort is normal. No respiratory distress.     Breath sounds: Normal breath sounds.  Abdominal:     General: Bowel sounds are normal. There is no distension.     Palpations: Abdomen is soft. There is no mass.     Tenderness: There is no abdominal tenderness. There is no guarding.  Skin:    General: Skin is warm and dry.  Neurological:     Mental Status: She is alert and oriented to person, place, and time.  Psychiatric:        Behavior: Behavior normal.      ED Treatments / Results  Labs (all labs ordered are listed, but only abnormal results are displayed) Labs Reviewed - No data to display  EKG None  Radiology No results found.  Procedures .Foreign Body Removal  Date/Time: 09/09/2019 8:33 PM Performed by: Margarita Mail, PA-C Authorized by: Margarita Mail, PA-C  Consent: Verbal consent obtained. Consent given by: patient Patient identity confirmed: verbally with patient Intake: mouth.  Sedation: Patient sedated: no  Patient restrained: no Complexity: simple 1 objects recovered. Objects recovered: walnut Post-procedure assessment: foreign body removed Patient tolerance: patient tolerated the procedure well with no immediate  complications   (including critical care time)  Medications Ordered in ED Medications - No data to display   Initial Impression / Assessment and Plan / ED Course  I have reviewed the triage vital signs and the nursing notes.  Pertinent labs & imaging results that were available during my care of the patient were reviewed by me and considered in my medical decision making (see chart for details).        Patient with a walnut wedged in between her torus palatinus.  I was easily able to remove the object using the firm and of a long Q-tip.  Patient has no evidence of anaphylaxis or angioedema.  Patient appears appropriate for discharge at this time.  Discussed return precautions and outpatient follow-up.  Final Clinical Impressions(s) / ED Diagnoses   Final diagnoses:  Foreign body (FB) in soft tissue    ED Discharge Orders    None       Margarita Mail, PA-C 09/09/19 2035    Maudie Flakes, MD 09/10/19 [phone]

## 2019-09-12 ENCOUNTER — Ambulatory Visit: Payer: Medicare Other | Admitting: Gastroenterology

## 2019-09-17 ENCOUNTER — Telehealth: Payer: Self-pay

## 2019-09-17 NOTE — Telephone Encounter (Signed)
[place] paperwork was received to be reviewed prior to pt's appointment. Pt had to reschedule to 11/01/2019. Paperwork placed on desk/chair for Dr. [name] to review.

## 2019-09-17 NOTE — Telephone Encounter (Signed)
REVIEWED-NO ADDITIONAL RECOMMENDATIONS. 

## 2019-10-01 ENCOUNTER — Telehealth: Payer: Self-pay | Admitting: Orthopaedic Surgery

## 2019-10-01 NOTE — Telephone Encounter (Signed)
Leasia's caretaker, Dulcy Fanny called in today for refill on Hydrocodone/Acetaminophen 5-325 mgs for this patient.  She said she didn't know exactly when previous refill was done and that she couldn't find the bottle.  She said she knew it was after the 17th of last month that it was filled.  I told her that Dr. Luna Glasgow was out of the office until Monday, 10/08/19 but that I could send the request to Dr. Aline Brochure.  She said just wait and send it to Dr. Luna Glasgow when he  Returns .  Refill on Hydrocodone/Acetaminophen 5-325  Mgs.  Qty  56  Sig: TAKE (1) TABLET BY MOUTH EVERY (6) HOURS AS NEEDED FOR MODERATE PAIN.  Patient uses [employer]

## 2019-10-03 ENCOUNTER — Ambulatory Visit (HOSPITAL_COMMUNITY): Payer: Medicare Other

## 2019-10-04 ENCOUNTER — Telehealth: Payer: Self-pay | Admitting: Orthopedic Surgery

## 2019-10-04 MED ORDER — HYDROCODONE-ACETAMINOPHEN 5-325 MG PO TABS: ORAL_TABLET | ORAL | 0 refills | Status: DC

## 2019-10-04 NOTE — Telephone Encounter (Signed)
Patient of Dr. Brooke Bonito requests  Refill on Hydrocodone/Acetaminophen 5-325  Mgs.  Qty  56  Sig: TAKE (1) TABLET BY MOUTH EVERY (6) HOURS AS NEEDED FOR MODERATE PAIN.  Patient uses [employer]

## 2019-10-04 NOTE — Telephone Encounter (Deleted)
We received a call back  this morning asking to go ahead and have refill sent to Dr. Aline Brochure.  Caregiver states that Sh[place]rel has been in a lot of pain with this cold weather that we have been having.   Refill on Hydrocodone/Acetaminophen 5-325  Mgs.  Qty  56  Sig: TAKE (1) TABLET BY MOUTH EVERY (6) HOURS AS NEEDED FOR MODERATE PAIN.  Patient uses [employer]

## 2019-10-08 ENCOUNTER — Encounter (HOSPITAL_COMMUNITY): Payer: Self-pay | Admitting: Emergency Medicine

## 2019-10-08 ENCOUNTER — Observation Stay (HOSPITAL_COMMUNITY)
Admission: EM | Admit: 2019-10-08 | Discharge: 2019-10-09 | Disposition: A | Discharge status: Home / Self Care | Payer: Medicare Other | Attending: Family Medicine | Admitting: Family Medicine

## 2019-10-08 ENCOUNTER — Other Ambulatory Visit: Payer: Self-pay

## 2019-10-08 DIAGNOSIS — Z20828 Contact with and (suspected) exposure to other viral communicable diseases: Secondary | ICD-10-CM | POA: Diagnosis not present

## 2019-10-08 DIAGNOSIS — J45909 Unspecified asthma, uncomplicated: Secondary | ICD-10-CM | POA: Diagnosis not present

## 2019-10-08 DIAGNOSIS — R945 Abnormal results of liver function studies: Secondary | ICD-10-CM | POA: Diagnosis not present

## 2019-10-08 DIAGNOSIS — F1010 Alcohol abuse, uncomplicated: Secondary | ICD-10-CM | POA: Diagnosis not present

## 2019-10-08 DIAGNOSIS — E871 Hypo-osmolality and hyponatremia: Secondary | ICD-10-CM | POA: Diagnosis not present

## 2019-10-08 DIAGNOSIS — E876 Hypokalemia: Secondary | ICD-10-CM | POA: Diagnosis present

## 2019-10-08 DIAGNOSIS — Z87891 Personal history of nicotine dependence: Secondary | ICD-10-CM | POA: Insufficient documentation

## 2019-10-08 DIAGNOSIS — K7030 Alcoholic cirrhosis of liver without ascites: Secondary | ICD-10-CM | POA: Insufficient documentation

## 2019-10-08 DIAGNOSIS — K529 Noninfective gastroenteritis and colitis, unspecified: Secondary | ICD-10-CM

## 2019-10-08 DIAGNOSIS — K625 Hemorrhage of anus and rectum: Principal | ICD-10-CM | POA: Diagnosis present

## 2019-10-08 DIAGNOSIS — K2100 Gastro-esophageal reflux disease with esophagitis, without bleeding: Secondary | ICD-10-CM | POA: Diagnosis present

## 2019-10-08 DIAGNOSIS — Z79899 Other long term (current) drug therapy: Secondary | ICD-10-CM | POA: Diagnosis not present

## 2019-10-08 DIAGNOSIS — I69339 Monoplegia of upper limb following cerebral infarction affecting unspecified side: Secondary | ICD-10-CM | POA: Insufficient documentation

## 2019-10-08 DIAGNOSIS — G40909 Epilepsy, unspecified, not intractable, without status epilepticus: Secondary | ICD-10-CM | POA: Diagnosis not present

## 2019-10-08 DIAGNOSIS — I10 Essential (primary) hypertension: Secondary | ICD-10-CM | POA: Insufficient documentation

## 2019-10-08 DIAGNOSIS — K7460 Unspecified cirrhosis of liver: Secondary | ICD-10-CM | POA: Diagnosis present

## 2019-10-08 DIAGNOSIS — D649 Anemia, unspecified: Secondary | ICD-10-CM

## 2019-10-08 DIAGNOSIS — K219 Gastro-esophageal reflux disease without esophagitis: Secondary | ICD-10-CM

## 2019-10-08 LAB — CBC
HCT: 29.6 % — ABNORMAL LOW (ref 36.0–46.0)
Hemoglobin: 9.7 g/dL — ABNORMAL LOW (ref 12.0–15.0)
MCH: 30.4 pg (ref 26.0–34.0)
MCHC: 32.8 g/dL (ref 30.0–36.0)
MCV: 92.8 fL (ref 80.0–100.0)
Platelets: 106 10*3/uL — ABNORMAL LOW (ref 150–400)
RBC: 3.19 MIL/uL — ABNORMAL LOW (ref 3.87–5.11)
RDW: 13.3 % (ref 11.5–15.5)
WBC: 4.1 10*3/uL (ref 4.0–10.5)
nRBC: 0 % (ref 0.0–0.2)

## 2019-10-08 LAB — COMPREHENSIVE METABOLIC PANEL
ALT: 37 U/L (ref 0–44)
ALT: 31 U/L (ref 0–44)
AST: 60 U/L — ABNORMAL HIGH (ref 15–41)
AST: 49 U/L — ABNORMAL HIGH (ref 15–41)
Albumin: 3.7 g/dL (ref 3.5–5.0)
Albumin: 3.3 g/dL — ABNORMAL LOW (ref 3.5–5.0)
Alkaline Phosphatase: 131 U/L — ABNORMAL HIGH (ref 38–126)
Alkaline Phosphatase: 115 U/L (ref 38–126)
Anion gap: 13 (ref 5–15)
Anion gap: 12 (ref 5–15)
BUN: 10 mg/dL (ref 8–23)
BUN: 9 mg/dL (ref 8–23)
CO2: 22 mmol/L (ref 22–32)
CO2: 24 mmol/L (ref 22–32)
Calcium: 8.8 mg/dL — ABNORMAL LOW (ref 8.9–10.3)
Calcium: 8.8 mg/dL — ABNORMAL LOW (ref 8.9–10.3)
Chloride: 94 mmol/L — ABNORMAL LOW (ref 98–111)
Chloride: 98 mmol/L (ref 98–111)
Creatinine, Ser: 0.66 mg/dL (ref 0.44–1.00)
Creatinine, Ser: 0.65 mg/dL (ref 0.44–1.00)
GFR calc Af Amer: >60 mL/min (ref >60)
GFR calc Af Amer: >60 mL/min (ref >60)
GFR calc non Af Amer: >60 mL/min (ref >60)
GFR calc non Af Amer: >60 mL/min (ref >60)
Glucose, Bld: 104 mg/dL — ABNORMAL HIGH (ref 70–99)
Glucose, Bld: 121 mg/dL — ABNORMAL HIGH (ref 70–99)
Potassium: 3.3 mmol/L — ABNORMAL LOW (ref 3.5–5.1)
Potassium: 4 mmol/L (ref 3.5–5.1)
Sodium: 129 mmol/L — ABNORMAL LOW (ref 135–145)
Sodium: 134 mmol/L — ABNORMAL LOW (ref 135–145)
Total Bilirubin: 0.9 mg/dL (ref 0.3–1.2)
Total Bilirubin: 0.8 mg/dL (ref 0.3–1.2)
Total Protein: 8.1 g/dL (ref 6.5–8.1)
Total Protein: 7.1 g/dL (ref 6.5–8.1)

## 2019-10-08 LAB — CBC WITH DIFFERENTIAL/PLATELET
Abs Immature Granulocytes: 0.01 10*3/uL (ref 0.00–0.07)
Basophils Absolute: 0 10*3/uL (ref 0.0–0.1)
Basophils Relative: 1 %
Eosinophils Absolute: 0.1 10*3/uL (ref 0.0–0.5)
Eosinophils Relative: 2 %
HCT: 31.2 % — ABNORMAL LOW (ref 36.0–46.0)
Hemoglobin: 10.3 g/dL — ABNORMAL LOW (ref 12.0–15.0)
Immature Granulocytes: 0 %
Lymphocytes Relative: 29 %
Lymphs Abs: 1.3 10*3/uL (ref 0.7–4.0)
MCH: 30.5 pg (ref 26.0–34.0)
MCHC: 33 g/dL (ref 30.0–36.0)
MCV: 92.3 fL (ref 80.0–100.0)
Monocytes Absolute: 0.5 10*3/uL (ref 0.1–1.0)
Monocytes Relative: 11 %
Neutro Abs: 2.6 10*3/uL (ref 1.7–7.7)
Neutrophils Relative %: 57 %
Platelets: 126 10*3/uL — ABNORMAL LOW (ref 150–400)
RBC: 3.38 MIL/uL — ABNORMAL LOW (ref 3.87–5.11)
RDW: 13.2 % (ref 11.5–15.5)
WBC: 4.5 10*3/uL (ref 4.0–10.5)
nRBC: 0 % (ref 0.0–0.2)

## 2019-10-08 LAB — RETICULOCYTES
Immature Retic Fract: 18.4 % — ABNORMAL HIGH (ref 2.3–15.9)
RBC.: 3.38 MIL/uL — ABNORMAL LOW (ref 3.87–5.11)
Retic Count, Absolute: 90.6 10*3/uL (ref 19.0–186.0)
Retic Ct Pct: 2.7 % (ref 0.4–3.1)

## 2019-10-08 LAB — IRON AND TIBC
Iron: 46 ug/dL (ref 28–170)
Saturation Ratios: 11 % (ref 10.4–31.8)
TIBC: 404 ug/dL (ref 250–450)
UIBC: 358 ug/dL

## 2019-10-08 LAB — FERRITIN: Ferritin: 36 ng/mL (ref 11–307)

## 2019-10-08 LAB — FOLATE: Folate: 18.3 ng/mL (ref >5.9)

## 2019-10-08 LAB — POC OCCULT BLOOD, ED: Fecal Occult Bld: POSITIVE — AB

## 2019-10-08 LAB — PROTIME-INR
INR: 1.1 (ref 0.8–1.2)
Prothrombin Time: 14.3 seconds (ref 11.4–15.2)

## 2019-10-08 LAB — SARS CORONAVIRUS 2 (TAT 6-24 HRS): SARS Coronavirus 2: NEGATIVE

## 2019-10-08 LAB — APTT: aPTT: 32 seconds (ref 24–36)

## 2019-10-08 LAB — TYPE AND SCREEN
ABO/RH(D): O POS
Antibody Screen: NEGATIVE

## 2019-10-08 LAB — HEPATITIS PANEL, ACUTE
HCV Ab: NONREACTIVE
Hep A IgM: NONREACTIVE
Hep B C IgM: NONREACTIVE
Hepatitis B Surface Ag: NONREACTIVE

## 2019-10-08 LAB — VITAMIN B12: Vitamin B-12: 492 pg/mL (ref 180–914)

## 2019-10-08 MED ORDER — HYDRALAZINE HCL 20 MG/ML IJ SOLN: 5.0000 mg | Freq: Four times a day (QID) | INTRAMUSCULAR | Status: DC | PRN

## 2019-10-08 MED ORDER — LORAZEPAM 2 MG/ML IJ SOLN: 0.0000 mg | Freq: Four times a day (QID) | INTRAMUSCULAR | Status: DC

## 2019-10-08 MED ORDER — LORAZEPAM 2 MG/ML IJ SOLN: 1.0000 mg | INTRAMUSCULAR | Status: DC | PRN

## 2019-10-08 MED ORDER — THIAMINE HCL 100 MG/ML IJ SOLN
100.0000 mg | Freq: Every day | INTRAMUSCULAR | Status: DC
  Filled 2019-10-08: qty 2

## 2019-10-08 MED ORDER — FAMOTIDINE IN NACL 20-0.9 MG/50ML-% IV SOLN
20.0000 mg | Freq: Once | INTRAVENOUS | Status: AC
  Administered 2019-10-08: 20 mg via INTRAVENOUS
  Filled 2019-10-08: qty 50

## 2019-10-08 MED ORDER — FOLIC ACID 1 MG PO TABS
1.0000 mg | ORAL_TABLET | Freq: Every day | ORAL | Status: DC
  Administered 2019-10-08 – 2019-10-09 (×2): 1 mg via ORAL
  Filled 2019-10-08 (×2): qty 1

## 2019-10-08 MED ORDER — THIAMINE HCL 100 MG PO TABS
100.0000 mg | ORAL_TABLET | Freq: Every day | ORAL | Status: DC
  Administered 2019-10-09: 100 mg via ORAL
  Filled 2019-10-08 (×2): qty 1

## 2019-10-08 MED ORDER — HYDROCODONE-ACETAMINOPHEN 5-325 MG PO TABS: 1.0000 | ORAL_TABLET | Freq: Four times a day (QID) | ORAL | Status: DC | PRN

## 2019-10-08 MED ORDER — PANTOPRAZOLE SODIUM 40 MG PO TBEC
40.0000 mg | DELAYED_RELEASE_TABLET | Freq: Every day | ORAL | Status: DC
  Administered 2019-10-09: 40 mg via ORAL
  Filled 2019-10-08: qty 1

## 2019-10-08 MED ORDER — SODIUM CHLORIDE 0.9 % IV SOLN
1.0000 g | INTRAVENOUS | Status: DC
  Administered 2019-10-08 – 2019-10-09 (×2): 1 g via INTRAVENOUS
  Filled 2019-10-08 (×2): qty 10

## 2019-10-08 MED ORDER — ACETAMINOPHEN 650 MG RE SUPP: 650.0000 mg | Freq: Four times a day (QID) | RECTAL | Status: DC | PRN

## 2019-10-08 MED ORDER — PHENYTOIN SODIUM EXTENDED 100 MG PO CAPS
100.0000 mg | ORAL_CAPSULE | Freq: Three times a day (TID) | ORAL | Status: DC
  Administered 2019-10-08 – 2019-10-09 (×4): 100 mg via ORAL
  Filled 2019-10-08 (×4): qty 1

## 2019-10-08 MED ORDER — ADULT MULTIVITAMIN W/MINERALS CH
1.0000 | ORAL_TABLET | Freq: Every day | ORAL | Status: DC
  Administered 2019-10-08 – 2019-10-09 (×2): 1 via ORAL
  Filled 2019-10-08 (×2): qty 1

## 2019-10-08 MED ORDER — PANTOPRAZOLE SODIUM 40 MG IV SOLR
40.0000 mg | Freq: Once | INTRAVENOUS | Status: AC
  Administered 2019-10-08: 40 mg via INTRAVENOUS
  Filled 2019-10-08: qty 40

## 2019-10-08 MED ORDER — ACETAMINOPHEN 325 MG PO TABS: 650.0000 mg | ORAL_TABLET | Freq: Four times a day (QID) | ORAL | Status: DC | PRN

## 2019-10-08 MED ORDER — LORAZEPAM 2 MG/ML IJ SOLN: 0.0000 mg | Freq: Two times a day (BID) | INTRAMUSCULAR | Status: DC

## 2019-10-08 MED ORDER — LISINOPRIL 10 MG PO TABS
40.0000 mg | ORAL_TABLET | Freq: Every day | ORAL | Status: DC
  Administered 2019-10-08 – 2019-10-09 (×2): 40 mg via ORAL
  Filled 2019-10-08 (×2): qty 4

## 2019-10-08 MED ORDER — LORAZEPAM 1 MG PO TABS: 1.0000 mg | ORAL_TABLET | ORAL | Status: DC | PRN

## 2019-10-08 MED ORDER — POTASSIUM CHLORIDE IN NACL 20-0.9 MEQ/L-% IV SOLN
INTRAVENOUS | Status: AC
  Filled 2019-10-08: qty 1000

## 2019-10-08 MED ORDER — ALBUTEROL SULFATE (2.5 MG/3ML) 0.083% IN NEBU: 2.5000 mg | INHALATION_SOLUTION | Freq: Four times a day (QID) | RESPIRATORY_TRACT | Status: DC | PRN

## 2019-10-08 MED ORDER — ALBUTEROL SULFATE HFA 108 (90 BASE) MCG/ACT IN AERS
2.0000 | INHALATION_SPRAY | Freq: Four times a day (QID) | RESPIRATORY_TRACT | Status: DC | PRN
  Filled 2019-10-08: qty 6.7

## 2019-10-08 NOTE — ED Triage Notes (Signed)
[facility] - pt c/o bright red bloody stools x 3 since dinner without clots.

## 2019-10-08 NOTE — H&P (Signed)
[facility] H&P    Patient Demographics:    [name], is a 63 y.o. female  MRN: [id number]  DOB - Aug 19, 1956  Admit Date - 10/08/2019  Referring MD/NP/PA: Noemi Chapel  Outpatient Primary MD for the patient is [name], MD  Patient coming from:  home  Chief complaint- rectal bleeding   HPI:    [name]  is a 63 y.o. female, 63 y.o. female, w hypertension, h/o stroke with R arm contracture, Asthma, ETOH abuse, Gerd, PUD,  apparently presents w/ c/o brbpr x3.  Pt denies abd pain, n/v, diarrhea, black stool.   Pt denies Nsaid use.  Pt has not drank recently.  Pt notes prior EGD 12/27/2017=> grade D reflux esophagitis, colonoscopy 10/03/2012=> diverticulosis  In ED,   Wbc 4.5, Hgb 10.3, Plt 126 Na 129, K 3.3,  Bun 10, Creatinine 0.66 Ast 60, Alt 37, Alk phos 131, T. Bili 0.9 INR 1.1  Sars covid-19 pending  FOBT positive,   ED states used anoscope-> no hemorrhroids.    ED requesting admission for rectal bleeding.       Review of systems:    In addition to the HPI above,  No Fever-chills, No Headache, No changes with Vision or hearing, No problems swallowing food or Liquids, No Chest pain, Cough or Shortness of Breath, No Abdominal pain, No Nausea or Vomiting,  No Blood in Urine, No dysuria, No new skin rashes or bruises, No new joints pains-aches,  No new weakness, tingling, numbness in any extremity, No recent weight gain or loss, No polyuria, polydypsia or polyphagia, No significant Mental Stressors.  All other systems reviewed and are negative.    Past History of the following :    Past Medical History:  Diagnosis Date  . Alcohol abuse   . Asthma   . Contracture of muscle of hand    right  . GERD (gastroesophageal reflux disease)   . HTN (hypertension)   . PUD (peptic ulcer disease)    remote  . Seizures ([place])    since stroke, no recent seizures  . Stroke  [facility])    age 13, required brain surgery      Past Surgical History:  Procedure Laterality Date  . ABDOMINAL HYSTERECTOMY  2004   complete  . BRAIN SURGERY     age 65, stroke  . COLONOSCOPY    . COLONOSCOPY WITH PROPOFOL  10/03/2012   Dr. [name]: moderate diverticulosis, small internal hemorrhoids, TUBULAR ADENOMA. Next colonoscopy 2023 per Dr. [name].  . ESOPHAGOGASTRODUODENOSCOPY N/A 10/18/2014   Dr. Gala Romney during inpatient hospitalization: severe exudative/erosive reflux esophagitis as source of trivial upper GI bleed. No varices   . ESOPHAGOGASTRODUODENOSCOPY (EGD) WITH PROPOFOL  10/03/2012   Dr. [name]: stricture at [place] junction s/p dilation, mild gastritis negative H.pylori   . ESOPHAGOGASTRODUODENOSCOPY (EGD) WITH PROPOFOL N/A 12/27/2017   Procedure: ESOPHAGOGASTRODUODENOSCOPY (EGD) WITH PROPOFOL;  Surgeon: Danie Binder, MD;  Location[facility]O [facility];  Service: Endoscopy;  Laterality: N/A;  10:15am  . right tib-fib fracture  2008  . SAVORY DILATION  10/03/2012   Procedure: SAVORY DILATION;  Surgeon: Danie Binder, MD;  Location: [facility] ORS;  Service: Endoscopy;  Laterality: N/A;  started at 1303,  dilated 12.8-16mm      Social History:      Social History   Tobacco Use  . Smoking status: Former Smoker    Packs/day: 0.04    Years: 9.00    Pack years: 0.36    Types: Cigarettes    Quit date: 07/28/2014    Years since quitting: 5.2  . Smokeless tobacco: Never Used  Substance Use Topics  . Alcohol use: Yes    Alcohol/week: 35.0 standard drinks    Types: 35 Cans of beer per week    Comment: "sometimes" 06/15/19       Family History :     Family History  Problem Relation Age of Onset  . Liver disease Sister        etoh  . Colon cancer Neg Hx        Home Medications:   Prior to Admission medications   Medication Sig Start Date End Date Taking? Authorizing Provider  acetaminophen (TYLENOL) 325 MG tablet Take 325-650 mg by mouth every 6 (six) hours as needed.     [provider]  albuterol (PROVENTIL HFA;VENTOLIN HFA) 108 (90 BASE) MCG/ACT inhaler Inhale 2 puffs into the lungs every 6 (six) hours as needed for wheezing or shortness of breath.     [provider]  albuterol (PROVENTIL) (2.5 MG/3ML) 0.083% nebulizer solution Take 2.5 mg by nebulization every 6 (six) hours as needed for wheezing or shortness of breath.    [provider]  ENSURE PLUS (ENSURE PLUS) LIQD Take 237 mLs by mouth 3 (three) times daily between meals.    [provider]  folic acid (FOLVITE) 1 MG tablet Take 1 tablet (1 mg total) by mouth daily. 07/03/19   Carlis Stable, NP  HYDROcodone-acetaminophen (NORCO/VICODIN) 5-325 MG tablet TAKE (1) TABLET BY MOUTH EVERY (6) HOURS AS NEEDED FOR MODERATE PAIN. 10/04/19   Carole Civil, MD  ipratropium-albuterol (DUONEB) 0.5-2.5 (3) MG/3ML SOLN Take 3 mLs by nebulization every 6 (six) hours as needed (for shortness of breath/wheezing).     [provider]  linaclotide (LINZESS) 145 MCG CAPS capsule Take 145 mcg by mouth daily before breakfast.    [provider]  lisinopril (ZESTRIL) 40 MG tablet Take 1 tablet by mouth daily. 04/05/19   [provider]  Multiple Vitamin (MULTIVITAMIN WITH MINERALS) TABS tablet Take 1 tablet by mouth daily. 04/14/19   Johnson, Clanford L, MD  ondansetron (ZOFRAN) 4 MG/5ML solution Take 4 mg by mouth as needed for nausea or vomiting.    [provider]  pantoprazole (PROTONIX) 40 MG tablet Take 1 tablet (40 mg total) by mouth daily. [place] 06/15/19   Carlis Stable, NP  phenytoin (DILANTIN) 100 MG ER capsule Take 100 mg by mouth 3 (three) times daily.    [provider]  thiamine 100 MG tablet Take 1 tablet (100 mg total) by mouth daily. 04/14/19   Johnson, Clanford L, MD     Allergies:    No Known Allergies   Physical Exam:   Vitals  Height 5' 4"  (1.626 m), weight 77.1 kg.  1.  General: axoxo3  2.  Psychiatric: euthymic  3. Neurologic: cn2-12 intact, reflexes 2+ symmetric, diffuse with no clonus, motor 5/5, contracture in RUE  4. HEENMT:  Anicteric, pupils 1.68m symmetric, direct, consensual,  near intact,  Neck: no jvd  5. Respiratory : CTAB  6. Cardiovascular : rrr s1, s2, no m/g/r  7. Gastrointestinal:  Abd: soft, nt, nd, +bs  8. Skin:  Ext: no c/c/e, no rash  9.Musculoskeletal:  Good ROM    Data Review:    CBC Recent Labs  Lab 10/08/19 0141  WBC 4.5  HGB 10.3*  HCT 31.2*  PLT 126*  MCV 92.3  MCH 30.5  MCHC 33.0  RDW 13.2  LYMPHSABS 1.3  MONOABS 0.5  EOSABS 0.1  BASOSABS 0.0   ------------------------------------------------------------------------------------------------------------------  Results for orders placed or performed during the hospital encounter of 10/08/19 (from the past 48 hour(s))  CBC with Differential     Status: Abnormal   Collection Time: 10/08/19  1:41 AM  Result Value Ref Range   WBC 4.5 4.0 - 10.5 K/uL   RBC 3.38 (L) 3.87 - 5.11 MIL/uL   Hemoglobin 10.3 (L) 12.0 - 15.0 g/dL   HCT 31.2 (L) 36.0 - 46.0 %   MCV 92.3 80.0 - 100.0 fL   MCH 30.5 26.0 - 34.0 pg   MCHC 33.0 30.0 - 36.0 g/dL   RDW 13.2 11.5 - 15.5 %   Platelets 126 (L) 150 - 400 K/uL   nRBC 0.0 0.0 - 0.2 %   Neutrophils Relative % 57 %   Neutro Abs 2.6 1.7 - 7.7 K/uL   Lymphocytes Relative 29 %   Lymphs Abs 1.3 0.7 - 4.0 K/uL   Monocytes Relative 11 %   Monocytes Absolute 0.5 0.1 - 1.0 K/uL   Eosinophils Relative 2 %   Eosinophils Absolute 0.1 0.0 - 0.5 K/uL   Basophils Relative 1 %   Basophils Absolute 0.0 0.0 - 0.1 K/uL   Immature Granulocytes 0 %   Abs Immature Granulocytes 0.01 0.00 - 0.07 K/uL    Comment: Performed at [facility], [address] [place], [place] [address]  CMP     Status: Abnormal   Collection Time: 10/08/19  1:41 AM  Result Value Ref Range   Sodium 129 (L) 135 - 145 mmol/L   Potassium 3.3 (L) 3.5 - 5.1 mmol/L   Chloride 94 (L)  98 - 111 mmol/L   CO2 22 22 - 32 mmol/L   Glucose, Bld 104 (H) 70 - 99 mg/dL   BUN 10 8 - 23 mg/dL   Creatinine, Ser 0.66 0.44 - 1.00 mg/dL   Calcium 8.8 (L) 8.9 - 10.3 mg/dL   Total Protein 8.1 6.5 - 8.1 g/dL   Albumin 3.7 3.5 - 5.0 g/dL   AST 60 (H) 15 - 41 U/L   ALT 37 0 - 44 U/L   Alkaline Phosphatase 131 (H) 38 - 126 U/L   Total Bilirubin 0.9 0.3 - 1.2 mg/dL   GFR calc non Af Amer >60 >60 mL/min   GFR calc Af Amer >60 >60 mL/min   Anion gap 13 5 - 15    Comment: Performed at [facility], [address] [place], [place] [address]  Reticulocytes     Status: Abnormal   Collection Time: 10/08/19  1:41 AM  Result Value Ref Range   Retic Ct Pct 2.7 0.4 - 3.1 %   RBC. 3.38 (L) 3.87 - 5.11 MIL/uL   Retic Count, Absolute 90.6 19.0 - 186.0 K/uL   Immature Retic Fract 18.4 (H) 2.3 - 15.9 %    Comment: Performed at [facility], [address], [place], [place] [address]  Protime-INR     Status:  None   Collection Time: 10/08/19  1:41 AM  Result Value Ref Range   Prothrombin Time 14.3 11.4 - 15.2 seconds   INR 1.1 0.8 - 1.2    Comment: (NOTE) INR goal varies based on device and disease states. Performed at [facility], [address] [place], [place] [address]   APTT     Status: None   Collection Time: 10/08/19  1:41 AM  Result Value Ref Range   aPTT 32 24 - 36 seconds    Comment: Performed at [facility], [address], [place], [place] [address]  POC occult blood, ED Provider will collect     Status: Abnormal   Collection Time: 10/08/19  2:13 AM  Result Value Ref Range   Fecal Occult Bld POSITIVE (A) NEGATIVE    Chemistries  Recent Labs  Lab 10/08/19 0141  NA 129*  K 3.3*  CL 94*  CO2 22  GLUCOSE 104*  BUN 10  CREATININE 0.66  CALCIUM 8.8*  AST 60*  ALT 37  ALKPHOS 131*  BILITOT 0.9    ------------------------------------------------------------------------------------------------------------------  ------------------------------------------------------------------------------------------------------------------ GFR: Estimated Creatinine Clearance: 73.3 mL/min (by C-G formula based on SCr of 0.66 mg/dL). Liver Function Tests: Recent Labs  Lab 10/08/19 0141  AST 60*  ALT 37  ALKPHOS 131*  BILITOT 0.9  PROT 8.1  ALBUMIN 3.7   No results for input(s): LIPASE, AMYLASE in the last 168 hours. No results for input(s): AMMONIA in the last 168 hours. Coagulation Profile: Recent Labs  Lab 10/08/19 0141  INR 1.1   Cardiac Enzymes: No results for input(s): CKTOTAL, CKMB, CKMBINDEX, TROPONINI in the last 168 hours. BNP (last 3 results) No results for input(s): PROBNP in the last 8760 hours. HbA1C: No results for input(s): HGBA1C in the last 72 hours. CBG: No results for input(s): GLUCAP in the last 168 hours. Lipid Profile: No results for input(s): CHOL, HDL, LDLCALC, TRIG, CHOLHDL, LDLDIRECT in the last 72 hours. Thyroid Function Tests: No results for input(s): TSH, T4TOTAL, FREET4, T3FREE, THYROIDAB in the last 72 hours. Anemia Panel: Recent Labs    10/08/19 0141  RETICCTPCT 2.7    --------------------------------------------------------------------------------------------------------------- Urine analysis:    Component Value Date/Time   COLORURINE YELLOW 04/11/2019 1614   APPEARANCEUR CLEAR 04/11/2019 1614   LABSPEC 1.004 (L) 04/11/2019 1614   PHURINE 7.0 04/11/2019 1614   GLUCOSEU NEGATIVE 04/11/2019 1614   HGBUR NEGATIVE 04/11/2019 1614   BILIRUBINUR NEGATIVE 04/11/2019 1614   KETONESUR NEGATIVE 04/11/2019 1614   PROTEINUR NEGATIVE 04/11/2019 1614   UROBILINOGEN 0.2 12/26/2014 1927   NITRITE NEGATIVE 04/11/2019 1614   LEUKOCYTESUR NEGATIVE 04/11/2019 1614      Imaging Results:    No results found.     Assessment & Plan:    Active  Problems:   Alcohol abuse   Hypokalemia   Reflux esophagitis   Abnormal liver function   Cirrhosis of liver without ascites (HCC)   Hyponatremia   Rectal bleeding  Rectal bleeding ? diverticular NPO Type and screen Check cbc in am, if Hgb decreasing, consider transfusion GI consult placed in computer  Abnormal liver function Check acute hepatitis panel Check cmp in am  Abdominal pain, epigastric Check lipase Protonix 51m iv bid  Hypokalemia Replete Check cmp in am If not improving consider checking magnesium  Hyponatremia Hydrate with ns iv Check cmp in am  H/o stroke/ seizure do Cont Dilantin 1032mpo tid  Constipation Cont Linzess  H/o Asthma Cont Albuterol HFA prn  ETOH abuse CIWA  Hypertension  Cont Lisinopril 69m po qday Hydralazine 175miv q6h prn sbp >160  H/o Constipation Hold Linzess for now   DVT Prophylaxis-    SCDs   AM Labs Ordered, also please review Full Orders  Family Communication: Admission, patients condition and plan of care including tests being ordered have been discussed with the patient  who indicate understanding and agree with the plan and Code Status.  Code Status:  FULL CODE per patient  Admission status: Observation: Based on patients clinical presentation and evaluation of above clinical data, I have made determination that patient meets Observation criteria at this time.   Time spent in minutes :  55 minutes   JaJani Gravel.D on 10/08/2019 at 2:35 AM

## 2019-10-08 NOTE — ED Notes (Signed)
Attempted to call report

## 2019-10-08 NOTE — Consult Note (Signed)
Referring Provider: Dr. Jani Gravel Primary Care Physician:  [name], MD Primary Gastroenterologist:  Dr. [name]   Date of Admission: 10/08/2019 Date of Consultation: 10/08/2019  Reason for Consultation:  Rectal bleeding  HPI:  [name] is a 63 y.o. 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 Presenting Hgb, which is close to outpatient baseline. Most recent Hgb this morning 9.7.   Notes abdominal cramping overnight and urge to have BM, then had large amount of bright red blood. Several episodes, turning burgundy color by final episode. Associated clots. No NSAIDs. No anticoagulation. No abdominal pain currently. No N/V. States she saw black stool last week while taking Linzess. Takes Linzess as needed, about once a week. Only sees dark stool if takes Linzess the one time a week. Drinks alcohol about twice a week: 12 pack of beer twice a week. +chills last night and felt like she had a fever. Did not take temperature. Denies cough. No shortness of breath. No mental status changes or confusion. No lower extremity edema. Heart rate fluctuating up to 1teens and 130s while in the room. Protonix daily. Controls symptoms most of the time. No dysphagia.   Anoscopy in ED: occult blood in stool, internal hemorrhoids, no mass, dark maroon blood in rectal vault.   Last imaging in June 2020 CT abd/pelvis WITHOUT contrast: cirrhosis   Covid test remains pending.    Past Medical History:  Diagnosis Date  . Alcohol abuse   . Asthma   . Contracture of muscle of hand    right  . GERD (gastroesophageal reflux disease)   . HTN (hypertension)   . PUD (peptic ulcer disease)    remote  . Seizures ([place])    since stroke, no recent seizures  . Stroke [facility])    age 22, required brain surgery    Past Surgical  History:  Procedure Laterality Date  . ABDOMINAL HYSTERECTOMY  2004   complete  . BRAIN SURGERY     age 69, stroke  . COLONOSCOPY    . COLONOSCOPY WITH PROPOFOL  10/03/2012   Dr. [name]: moderate diverticulosis, small internal hemorrhoids, TUBULAR ADENOMA. Next colonoscopy 2023 per Dr. [name].  . ESOPHAGOGASTRODUODENOSCOPY N/A 10/18/2014   Dr. Gala Romney during inpatient hospitalization: severe exudative/erosive reflux esophagitis as source of trivial upper GI bleed. No varices   . ESOPHAGOGASTRODUODENOSCOPY (EGD) WITH PROPOFOL  10/03/2012   Dr. [name]: stricture at [place] junction s/p dilation, mild gastritis negative H.pylori   . ESOPHAGOGASTRODUODENOSCOPY (EGD) WITH PROPOFOL N/A 12/27/2017   Grade D esophagitis. Medium-sized hiatal hernia, normal duodenum  . right tib-fib fracture  2008  . SAVORY DILATION  10/03/2012   Procedure: SAVORY DILATION;  Surgeon: Danie Binder, MD;  Location: [facility];  Service: Endoscopy;  Laterality: N/A;  started at 1303,  dilated 12.8-16mm    Prior to Admission medications   Medication Sig Start Date End Date Taking? Authorizing Provider  acetaminophen (TYLENOL) 325 MG tablet Take 325-650 mg by mouth every 6 (six) hours as needed.    [provider]  albuterol (PROVENTIL HFA;VENTOLIN HFA) 108 (90 BASE) MCG/ACT inhaler Inhale 2 puffs into the lungs every 6 (six) hours as needed for wheezing or shortness of breath.     [provider]  albuterol (PROVENTIL) (2.5 MG/3ML) 0.083% nebulizer solution Take 2.5 mg by nebulization every 6 (six)  hours as needed for wheezing or shortness of breath.    [provider]  ENSURE PLUS (ENSURE PLUS) LIQD Take 237 mLs by mouth 3 (three) times daily between meals.    [provider]  folic acid (FOLVITE) 1 MG tablet Take 1 tablet (1 mg total) by mouth daily. 07/03/19   Carlis Stable, NP  HYDROcodone-acetaminophen (NORCO/VICODIN) 5-325 MG tablet TAKE (1) TABLET BY MOUTH EVERY (6) HOURS AS NEEDED FOR  MODERATE PAIN. 10/04/19   Carole Civil, MD  ipratropium-albuterol (DUONEB) 0.5-2.5 (3) MG/3ML SOLN Take 3 mLs by nebulization every 6 (six) hours as needed (for shortness of breath/wheezing).     [provider]  linaclotide (LINZESS) 145 MCG CAPS capsule Take 145 mcg by mouth daily before breakfast.    [provider]  lisinopril (ZESTRIL) 40 MG tablet Take 1 tablet by mouth daily. 04/05/19   [provider]  Multiple Vitamin (MULTIVITAMIN WITH MINERALS) TABS tablet Take 1 tablet by mouth daily. 04/14/19   Johnson, Clanford L, MD  ondansetron (ZOFRAN) 4 MG/5ML solution Take 4 mg by mouth as needed for nausea or vomiting.    [provider]  pantoprazole (PROTONIX) 40 MG tablet Take 1 tablet (40 mg total) by mouth daily. [place] 06/15/19   Carlis Stable, NP  phenytoin (DILANTIN) 100 MG ER capsule Take 100 mg by mouth 3 (three) times daily.    [provider]  thiamine 100 MG tablet Take 1 tablet (100 mg total) by mouth daily. 04/14/19   Murlean Iba, MD    Current Facility-Administered Medications  Medication Dose Route Frequency Provider Last Rate Last Admin  . 0.9 % NaCl with KCl 20 mEq/ L  infusion   Intravenous Continuous Jani Gravel, MD 50 mL/hr at 10/08/19 0331 New Bag at 10/08/19 0331  . acetaminophen (TYLENOL) tablet 650 mg  650 mg Oral Q6H PRN Jani Gravel, MD       Or  . acetaminophen (TYLENOL) suppository 650 mg  650 mg Rectal Q6H PRN Jani Gravel, MD      . albuterol (VENTOLIN HFA) 108 (90 Base) MCG/ACT inhaler 2 puff  2 puff Inhalation Q6H PRN Jani Gravel, MD      . folic acid (FOLVITE) tablet 1 mg  1 mg Oral Daily Jani Gravel, MD      . hydrALAZINE (APRESOLINE) injection 5 mg  5 mg Intravenous Q6H PRN Jani Gravel, MD      . HYDROcodone-acetaminophen (NORCO/VICODIN) 5-325 MG per tablet 1 tablet  1 tablet Oral Q6H PRN Jani Gravel, MD      . lisinopril (ZESTRIL) tablet 40 mg  40 mg Oral Daily Jani Gravel, MD      .  LORazepam (ATIVAN) injection 0-4 mg  0-4 mg Intravenous Q6H Jani Gravel, MD       Followed by  . [START ON 10/10/2019] LORazepam (ATIVAN) injection 0-4 mg  0-4 mg Intravenous Q12H Jani Gravel, MD      . LORazepam (ATIVAN) tablet 1-4 mg  1-4 mg Oral Q1H PRN Jani Gravel, MD       Or  . LORazepam (ATIVAN) injection 1-4 mg  1-4 mg Intravenous Q1H PRN Jani Gravel, MD      . multivitamin with minerals tablet 1 tablet  1 tablet Oral Daily Jani Gravel, MD      . pantoprazole (PROTONIX) EC tablet 40 mg  40 mg Oral Daily Jani Gravel, MD      . phenytoin (DILANTIN) ER  capsule 100 mg  100 mg Oral TID Jani Gravel, MD      . thiamine tablet 100 mg  100 mg Oral Daily Jani Gravel, MD       Or  . thiamine (B-1) injection 100 mg  100 mg Intravenous Daily Jani Gravel, MD       Current Outpatient Medications  Medication Sig Dispense Refill  . acetaminophen (TYLENOL) 325 MG tablet Take 325-650 mg by mouth every 6 (six) hours as needed.    Marland Kitchen albuterol (PROVENTIL HFA;VENTOLIN HFA) 108 (90 BASE) MCG/ACT inhaler Inhale 2 puffs into the lungs every 6 (six) hours as needed for wheezing or shortness of breath.     Marland Kitchen albuterol (PROVENTIL) (2.5 MG/3ML) 0.083% nebulizer solution Take 2.5 mg by nebulization every 6 (six) hours as needed for wheezing or shortness of breath.    . ENSURE PLUS (ENSURE PLUS) LIQD Take 237 mLs by mouth 3 (three) times daily between meals.    . folic acid (FOLVITE) 1 MG tablet Take 1 tablet (1 mg total) by mouth daily. 30 tablet 0  . HYDROcodone-acetaminophen (NORCO/VICODIN) 5-325 MG tablet TAKE (1) TABLET BY MOUTH EVERY (6) HOURS AS NEEDED FOR MODERATE PAIN. 56 tablet 0  . ipratropium-albuterol (DUONEB) 0.5-2.5 (3) MG/3ML SOLN Take 3 mLs by nebulization every 6 (six) hours as needed (for shortness of breath/wheezing).     Marland Kitchen linaclotide (LINZESS) 145 MCG CAPS capsule Take 145 mcg by mouth daily before breakfast.    . lisinopril (ZESTRIL) 40 MG tablet Take 1 tablet by mouth daily.    . Multiple Vitamin  (MULTIVITAMIN WITH MINERALS) TABS tablet Take 1 tablet by mouth daily. 30 tablet 1  . naproxen (NAPROSYN) 500 MG tablet Take 500 mg by mouth 2 (two) times daily.    . ondansetron (ZOFRAN) 4 MG/5ML solution Take 4 mg by mouth as needed for nausea or vomiting.    . pantoprazole (PROTONIX) 40 MG tablet Take 1 tablet (40 mg total) by mouth daily. 30 MINS PRIOR TO FIRST MEAL 90 tablet 3  . PHENobarbital (LUMINAL) 32.4 MG tablet Take 32.4 mg by mouth 2 (two) times daily.    . phenytoin (DILANTIN) 100 MG ER capsule Take 100 mg by mouth 3 (three) times daily.    Marland Kitchen thiamine 100 MG tablet Take 1 tablet (100 mg total) by mouth daily. 30 tablet 1    Allergies as of 10/08/2019  . (No Known Allergies)    Family History  Problem Relation Age of Onset  . Liver disease Sister        etoh  . Colon cancer Neg Hx     Social History   Socioeconomic History  . Marital status: Single    Spouse name: Not on file  . Number of children: 3  . Years of education: Not on file  . Highest education level: Not on file  Occupational History  . Occupation: disability    Employer: UNEMPLOYED  Tobacco Use  . Smoking status: Former Smoker    Packs/day: 0.04    Years: 9.00    Pack years: 0.36    Types: Cigarettes    Quit date: 07/28/2014    Years since quitting: 5.2  . Smokeless tobacco: Never Used  Substance and Sexual Activity  . Alcohol use: Yes    Alcohol/week: 35.0 standard drinks    Types: 35 Cans of beer per week    Comment: 12 pack of beer twice per week   . Drug use: No  . Sexual activity:  Yes    Birth control/protection: Surgical  Other Topics Concern  . 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 HAS 3 KIDS: ONE IN [place], ONE IN [place], AND ONE OOT.   Social Determinants of Health   Financial Resource Strain:   . Difficulty of Paying Living Expenses: Not on file  Food Insecurity:   . Worried About Charity fundraiser in the Last Year: Not on file  . Ran Out  of Food in the Last Year: Not on file  Transportation Needs:   . Lack of Transportation (Medical): Not on file  . Lack of Transportation (Non-Medical): Not on file  Physical Activity:   . Days of Exercise per Week: Not on file  . Minutes of Exercise per Session: Not on file  Stress:   . Feeling of Stress : Not on file  Social Connections:   . Frequency of Communication with Friends and Family: Not on file  . Frequency of Social Gatherings with Friends and Family: Not on file  . Attends Religious Services: Not on file  . Active Member of Clubs or Organizations: Not on file  . Attends Archivist Meetings: Not on file  . Marital Status: Not on file  Intimate Partner Violence:   . Fear of Current or Ex-Partner: Not on file  . Emotionally Abused: Not on file  . Physically Abused: Not on file  . Sexually Abused: Not on file    Review of Systems: Gen: see HPI CV: Denies chest pain, heart palpitations, syncope, edema  Resp: Denies shortness of breath with rest, cough, wheezing GI: see HPI  GU : Denies urinary burning, urinary frequency, urinary incontinence.  MS: Denies joint pain,swelling, cramping Derm: Denies rash, itching, dry skin Psych: Denies depression, anxiety,confusion, or memory loss Heme: see HPI  Physical Exam: Vital signs in last 24 hours: Pulse Rate:  [81-98] 98 (12/21 0845) Resp:  [16-18] 18 (12/21 0845) BP: (120-143)/(52-70) 143/55 (12/21 0830) SpO2:  [98 %-100 %] 100 % (12/21 0845) Weight:  [77.1 kg] 77.1 kg (12/21 0122)   General:   Alert,  Well-developed, well-nourished, pleasant and cooperative in NAD Head:  Normocephalic and atraumatic. Eyes:  Sclera clear, no icterus.   Conjunctiva pink. Ears:  Normal auditory acuity. Lungs:  Clear throughout to auscultation.    Heart:  S1 S2 present without murmurs Abdomen:  Soft, nontender and nondistended. Hepatomegaly with liver margin palpable, no TTP, no rebound or guarding Rectal:  Deferred  Msk:   Right upper extremity contracture Pulses:  Normal pulses noted. Extremities:  Without edema. Neurologic:  Alert and  oriented x4 Psych:  Alert and cooperative. Normal mood and affect.  Intake/Output from previous day: No intake/output data recorded. Intake/Output this shift: No intake/output data recorded.  Lab Results: Recent Labs    10/08/19 0141 10/08/19 0837  WBC 4.5 4.1  HGB 10.3* 9.7*  HCT 31.2* 29.6*  PLT 126* 106*   BMET Recent Labs    10/08/19 0141 10/08/19 0837  NA 129* 134*  K 3.3* 4.0  CL 94* 98  CO2 22 24  GLUCOSE 104* 121*  BUN 10 9  CREATININE 0.66 0.65  CALCIUM 8.8* 8.8*   LFT Recent Labs    10/08/19 0141 10/08/19 0837  PROT 8.1 7.1  ALBUMIN 3.7 3.3*  AST 60* 49*  ALT 37 31  ALKPHOS 131* 115  BILITOT 0.9 0.8   PT/INR Recent Labs    10/08/19 0141  LABPROT  14.3  INR 1.1    Studies/Results: No results found.  Impression: 63 year old female with history of cirrhosis (MELD Na 7) likely secondary to ETOH, presenting with acute onset of several large volume episodes of rectal bleeding, associated cramping, now with resolution of abdominal discomfort and no further overt GI bleeding since presentation to ED. Hgb on admission near baseline at 10.3, now 9.7 this morning. INR normal. Denying any NSAIDs or anticoagulation. No known varices, with last EGD in 2019. Known diverticula, with last colonoscopy in 2013.  Doubt dealing with rapid transit upper GI bleed/variceal bleed at this point. Clinically, symptoms seem most consistent with possible bout of ischemic colitis, possible diverticular bleed but abdominal discomfort would not typically be present with diverticular origin. Encouragingly, no further bleeding since presentation and abdominal pain resolved. Will need to follow clinically and consider colonoscopy if persistent rectal bleeding and/or EGD if concern for upper GI source.   Heart rate has been variable during consultation today, jumping  to the 130s and back to 80s with patient at rest. Denying any chest pain, shortness of breath.   For now, hold off on octreotide but will start empiric antibiotics due to GI bleeding in a known cirrhosis patient.   Plan: PPI daily Rocephin empirically Follow H/H Continue PPI Monitor for further overt bleeding Further recommendations to follow   Annitta Needs, PhD, ANP-BC [facility]    LOS: 0 days    10/08/2019, 11:00 AM

## 2019-10-08 NOTE — ED Provider Notes (Signed)
[facility] EMERGENCY DEPARTMENT Provider Note   CSN: [id number] Arrival date & time: 10/08/19  0116     History Chief Complaint  Patient presents with  . Rectal Bleeding    [name] is a 63 y.o. female.  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  She has a remote history of peptic ulcer disease history of acid reflux and states that she does not take any anticoagulants.  That being said she has a known history of alcohol abuse, cirrhosis of the liver and has seen Dr. [name] for gastro follow-up with EGD and colonoscopies in the past.  She reports that she was in her usual state of health until tonight when she had 3 episodes of rectal bleeding the first which was bright red and the following which were more darker red.  She has mild epigastric and periumbilical discomfort but no nausea or vomiting, not vomiting up any blood.  Upper endoscopy was performed on December 27, 2017 and showed acid reflux disease with esophagitis, diaphragmatic hernia without obstruction.  There is no signs of esophageal varices or bleeding.  Colonoscopy last performed in 2013 showing some internal hemorrhoids which was thought to be the cause of the bleeding at that time.  The patient denies any ongoing use of anti-inflammatories and states that she still drinks occasional alcohol  Paramedics report that the patient had normal vital signs in route to the hospital.  Past Medical History:  Diagnosis Date  . Alcohol abuse   . Asthma   . Contracture of muscle of hand    right  . GERD (gastroesophageal reflux disease)   . HTN (hypertension)   . PUD (peptic ulcer disease)    remote  . Seizures ([place])    since stroke, no recent seizures  . Stroke [facility])    age 67, required brain surgery    Patient Active Problem List   Diagnosis Date Noted  . Hyponatremia   .  Diarrhea 04/11/2019  . Nausea, vomiting, and diarrhea   . ARF (acute renal failure) ([place]) 04/10/2019  . Acute kidney injury ([place]) 01/22/2019  . History of stroke 01/22/2019  . Asthma 01/22/2019  . Gastroenteritis 01/22/2019  . Stroke ([place]) 06/14/2018  . Cirrhosis of liver without ascites ([place]) 04/21/2017  . AKI (acute kidney injury) ([place]) 07/30/2016  . Anemia 11/25/2015  . Reflux esophagitis   . Sinus tachycardia ([place]) 10/10/2014  . Hypokalemia 10/10/2014  . Seizure disorder ([place]) 10/10/2014  . Alcohol abuse 09/05/2012  . GERD (gastroesophageal reflux disease) 09/05/2012  . Abdominal pain 09/05/2012  . FX CLOSED TIBIA NOS 07/27/2007  . STROKE-With also h/o Aneurysm clipping? 07/25/2007  . SEIZURES 07/25/2007    Past Surgical History:  Procedure Laterality Date  . ABDOMINAL HYSTERECTOMY  2004   complete  . BRAIN SURGERY     age 74, stroke  . COLONOSCOPY    . COLONOSCOPY WITH PROPOFOL  10/03/2012   Dr. [name]: moderate diverticulosis, small internal hemorrhoids, TUBULAR ADENOMA. Next colonoscopy 2023 per Dr. [name].  . ESOPHAGOGASTRODUODENOSCOPY N/A 10/18/2014   Dr. Gala Romney during inpatient hospitalization: severe exudative/erosive reflux esophagitis as source of trivial upper GI bleed. No varices   . ESOPHAGOGASTRODUODENOSCOPY (EGD) WITH PROPOFOL  10/03/2012   Dr. [name]: stricture at [place] junction s/p dilation, mild gastritis negative H.pylori   . ESOPHAGOGASTRODUODENOSCOPY (EGD) WITH PROPOFOL N/A 12/27/2017  Procedure: ESOPHAGOGASTRODUODENOSCOPY (EGD) WITH PROPOFOL;  Surgeon: Danie Binder, MD;  Location: [facility] ENDO SUITE;  Service: Endoscopy;  Laterality: N/A;  10:15am  . right tib-fib fracture  2008  . SAVORY DILATION  10/03/2012   Procedure: SAVORY DILATION;  Surgeon: Danie Binder, MD;  Location: [facility] ORS;  Service: Endoscopy;  Laterality: N/A;  started at 1303,  dilated 12.8-16mm     OB History    Gravida  4   Para  3   Term  3   Preterm      AB  1   Living  3       SAB  1   TAB      Ectopic      Multiple      Live Births              Family History  Problem Relation Age of Onset  . Liver disease Sister        etoh  . Colon cancer Neg Hx     Social History   Tobacco Use  . Smoking status: Former Smoker    Packs/day: 0.04    Years: 9.00    Pack years: 0.36    Types: Cigarettes    Quit date: 07/28/2014    Years since quitting: 5.2  . Smokeless tobacco: Never Used  Substance Use Topics  . Alcohol use: Yes    Alcohol/week: 35.0 standard drinks    Types: 35 Cans of beer per week    Comment: "sometimes" 06/15/19  . Drug use: No    Home Medications Prior to Admission medications   Medication Sig Start Date End Date Taking? Authorizing Provider  acetaminophen (TYLENOL) 325 MG tablet Take 325-650 mg by mouth every 6 (six) hours as needed.    [provider]  albuterol (PROVENTIL HFA;VENTOLIN HFA) 108 (90 BASE) MCG/ACT inhaler Inhale 2 puffs into the lungs every 6 (six) hours as needed for wheezing or shortness of breath.     [provider]  albuterol (PROVENTIL) (2.5 MG/3ML) 0.083% nebulizer solution Take 2.5 mg by nebulization every 6 (six) hours as needed for wheezing or shortness of breath.    [provider]  ENSURE PLUS (ENSURE PLUS) LIQD Take 237 mLs by mouth 3 (three) times daily between meals.    [provider]  folic acid (FOLVITE) 1 MG tablet Take 1 tablet (1 mg total) by mouth daily. 07/03/19   Carlis Stable, NP  HYDROcodone-acetaminophen (NORCO/VICODIN) 5-325 MG tablet TAKE (1) TABLET BY MOUTH EVERY (6) HOURS AS NEEDED FOR MODERATE PAIN. 10/04/19   Carole Civil, MD  ipratropium-albuterol (DUONEB) 0.5-2.5 (3) MG/3ML SOLN Take 3 mLs by nebulization every 6 (six) hours as needed (for shortness of breath/wheezing).     [provider]  linaclotide (LINZESS) 145 MCG CAPS capsule Take 145 mcg by mouth daily before breakfast.    [provider]  lisinopril (ZESTRIL) 40  MG tablet Take 1 tablet by mouth daily. 04/05/19   [provider]  Multiple Vitamin (MULTIVITAMIN WITH MINERALS) TABS tablet Take 1 tablet by mouth daily. 04/14/19   Johnson, Clanford L, MD  ondansetron (ZOFRAN) 4 MG/5ML solution Take 4 mg by mouth as needed for nausea or vomiting.    [provider]  pantoprazole (PROTONIX) 40 MG tablet Take 1 tablet (40 mg total) by mouth daily. [place] 06/15/19   Carlis Stable, NP  phenytoin (DILANTIN) 100 MG ER capsule Take 100 mg by  mouth 3 (three) times daily.    [provider]  thiamine 100 MG tablet Take 1 tablet (100 mg total) by mouth daily. 04/14/19   Murlean Iba, MD    Allergies    Patient has no known allergies.  Review of Systems   Review of Systems  All other systems reviewed and are negative.   Physical Exam Updated Vital Signs Ht 1.626 m (5\' 4" )   Wt 77.1 kg   BMI 29.18 kg/m   Physical Exam Vitals and nursing note reviewed. Exam conducted with a chaperone present.  Constitutional:      General: She is not in acute distress.    Appearance: She is well-developed.  HENT:     Head: Normocephalic and atraumatic.     Mouth/Throat:     Pharynx: No oropharyngeal exudate.  Eyes:     General: No scleral icterus.       Right eye: No discharge.        Left eye: No discharge.     Conjunctiva/sclera: Conjunctivae normal.     Pupils: Pupils are equal, round, and reactive to light.     Comments: Normal conjunctivae - not pale  Neck:     Thyroid: No thyromegaly.     Vascular: No JVD.  Cardiovascular:     Rate and Rhythm: Normal rate and regular rhythm.     Heart sounds: Normal heart sounds. No murmur. No friction rub. No gallop.   Pulmonary:     Effort: Pulmonary effort is normal. No respiratory distress.     Breath sounds: Normal breath sounds. No wheezing or rales.  Abdominal:     General: Bowel sounds are normal. There is no distension.     Palpations: Abdomen is soft. There is  no mass.     Tenderness: There is abdominal tenderness ( mild periumbilical pain).  Genitourinary:    Comments: Chaperone - present - see anoscopy result below. Musculoskeletal:        General: No tenderness. Normal range of motion.     Cervical back: Normal range of motion and neck supple.  Lymphadenopathy:     Cervical: No cervical adenopathy.  Skin:    General: Skin is warm and dry.     Findings: No erythema or rash.  Neurological:     Mental Status: She is alert.     Coordination: Coordination normal.     Comments: RUE contracture - (baseline) Normal speech and coordination otherwise.  Psychiatric:        Behavior: Behavior normal.     ED Results / Procedures / Treatments   Labs (all labs ordered are listed, but only abnormal results are displayed) Labs Reviewed  CBC WITH DIFFERENTIAL/PLATELET - Abnormal; Notable for the following components:      Result Value   RBC 3.38 (*)    Hemoglobin 10.3 (*)    HCT 31.2 (*)    Platelets 126 (*)    All other components within normal limits  COMPREHENSIVE METABOLIC PANEL - Abnormal; Notable for the following components:   Sodium 129 (*)    Potassium 3.3 (*)    Chloride 94 (*)    Glucose, Bld 104 (*)    Calcium 8.8 (*)    AST 60 (*)    Alkaline Phosphatase 131 (*)    All other components within normal limits  RETICULOCYTES - Abnormal; Notable for the following components:   RBC. 3.38 (*)    Immature Retic Fract 18.4 (*)    All other  components within normal limits  POC OCCULT BLOOD, ED - Abnormal; Notable for the following components:   Fecal Occult Bld POSITIVE (*)    All other components within normal limits  SARS CORONAVIRUS 2 (TAT 6-24 HRS)  PROTIME-INR  APTT  VITAMIN B12  FOLATE  IRON AND TIBC  FERRITIN  POC SARS CORONAVIRUS 2 AG -  ED  TYPE AND SCREEN    EKG None  Radiology No results found.  Procedures LOWER ENDOSCOPY  Date/Time: 10/08/2019 1:28 AM Performed by: Noemi Chapel, MD Authorized by:  Noemi Chapel, MD  Consent: Verbal consent obtained. Risks and benefits: risks, benefits and alternatives were discussed Consent given by: patient Patient understanding: patient states understanding of the procedure being performed Patient consent: the patient's understanding of the procedure matches consent given Procedure consent: procedure consent matches procedure scheduled Relevant documents: relevant documents present and verified Test results: test results available and properly labeled Site marked: the operative site was marked Imaging studies: imaging studies available Required items: required blood products, implants, devices, and special equipment available Patient identity confirmed: verbally with patient Time out: Immediately prior to procedure a "time out" was called to verify the correct patient, procedure, equipment, support staff and site/side marked as required. Indications: rectal bleeding  Sedation: Patient sedated: no  Preparation: Patient was prepped and draped in the usual sterile fashion. Scope type: anoscope External exam performed: yes Negative external exam findings: no pilonidal cyst, no perianal skin tags, no perirectal warts, no perianal erythema and no external hemorrhoids Digital exam performed: yes Positive digital exam findings: occult blood in stool Negative digital exam findings: no laxity of anal sphincter Positive internal exam findings: internal hemorrhoid Negative internal exam findings: no intraluminal mass, no inflammation, no anal fissures, no anal fistulae, no anal stricture and no abscess Internal hemorrhoid prolapsed: no Comments: The patient had dark maroon blood in the rectal vault, there were some scattered internal hemorrhoids but no active bright red bleeding.  Digital exam without masses or tenderness      (including critical care time)  Medications Ordered in ED Medications  famotidine (PEPCID) IVPB 20 mg premix (20 mg  Intravenous New Bag/Given 10/08/19 0214)  pantoprazole (PROTONIX) injection 40 mg (40 mg Intravenous Given 10/08/19 0214)    ED Course  I have reviewed the triage vital signs and the nursing notes.  Pertinent labs & imaging results that were available during my care of the patient were reviewed by me and considered in my medical decision making (see chart for details).  Clinical Course as of Oct 08 227  Mon Oct 08, 2019  0219 The patient's blood counts reveal a hemoglobin of 10.3 which is lower than previous but within the range of where she has trended in the past.  Metabolic panel is unremarkable except for mild hyponatremia and hypokalemia.  Will call the hospitalist for admission.  Both Protonix and Pepcid have been given   [BM]    Clinical Course User Index [BM] Noemi Chapel, MD   MDM Rules/Calculators/A&P                      This patient will need to have further evaluation likely in the inpatient setting, will start with checking hemoglobin and labs, she does not appear to need an acute transfusion based on her vital signs however given her history she likely has some type of internal bleeding.  Anticipate admission no symptoms of Covid at this time.  I discussed the case with Dr.  Kim of the hospitalist service will admit the patient to the hospital for trending of hemoglobin and possible transfusion if the patient worsens.  The patient was given a rapid Covid test as she may need an endoscopy.  Interventions include Pepcid and Protonix IV   [name] was evaluated in Emergency Department on 10/08/2019 for the symptoms described in the history of present illness. She was evaluated in the context of the global COVID-19 pandemic, which necessitated consideration that the patient might be at risk for infection with the SARS-CoV-2 virus that causes COVID-19. Institutional protocols and algorithms that pertain to the evaluation of patients at risk for COVID-19 are in a state of  rapid change based on information released by regulatory bodies including the CDC and federal and state organizations. These policies and algorithms were followed during the patient's care in the ED.   Final Clinical Impression(s) / ED Diagnoses Final diagnoses:  Rectal bleeding    Rx / DC Orders ED Discharge Orders    None       Noemi Chapel, MD 10/08/19 [phone]

## 2019-10-08 NOTE — Progress Notes (Signed)
Per HPI: [name]  is a 63 y.o. female,63 y.o.female,w hypertension, h/o stroke with R arm contracture, Asthma, ETOH abuse, Gerd, PUD, apparently presents w/ c/o brbpr x3.  Pt denies abd pain, n/v, diarrhea, black stool.  Pt denies Nsaid use. Pt has not drank recently. Pt notes prior EGD 12/27/2017=>grade D reflux esophagitis, colonoscopy 10/03/2012=>diverticulosis  12/21: Patient admitted for evaluation of rectal bleeding in the setting of cirrhosis secondary to alcohol use.  She has had stable hemoglobin levels with last hemoglobin at 9.7 from 10.3 earlier.  She has been seen by GI with plans for PPI daily as well as Rocephin empirically with ascites.  She has not had any further overt bleeding or bowel movements noted.  Will start on clear liquid diet for now.  Appreciate further GI evaluation possible endoscopy if bleeding becomes persistent and anemia worsens.  Repeat labs in a.m.  Covid testing has returned negative.  Total care time: 20 minutes.

## 2019-10-08 NOTE — Care Management Obs Status (Signed)
[place] NOTIFICATION   Patient Details  Name: [name] MRN: [id number] Date of Birth: 06-10-1956   Medicare Observation Status Notification Given:  Tennis Ship, RN will deliver to patient)    Tommy Medal 10/08/2019, 2:47 PM

## 2019-10-09 DIAGNOSIS — K625 Hemorrhage of anus and rectum: Secondary | ICD-10-CM

## 2019-10-09 LAB — CBC
HCT: 29.5 % — ABNORMAL LOW (ref 36.0–46.0)
Hemoglobin: 9.5 g/dL — ABNORMAL LOW (ref 12.0–15.0)
MCH: 30.3 pg (ref 26.0–34.0)
MCHC: 32.2 g/dL (ref 30.0–36.0)
MCV: 93.9 fL (ref 80.0–100.0)
Platelets: 137 10*3/uL — ABNORMAL LOW (ref 150–400)
RBC: 3.14 MIL/uL — ABNORMAL LOW (ref 3.87–5.11)
RDW: 14 % (ref 11.5–15.5)
WBC: 5 10*3/uL (ref 4.0–10.5)
nRBC: 0 % (ref 0.0–0.2)

## 2019-10-09 LAB — COMPREHENSIVE METABOLIC PANEL
ALT: 27 U/L (ref 0–44)
AST: 41 U/L (ref 15–41)
Albumin: 3.2 g/dL — ABNORMAL LOW (ref 3.5–5.0)
Alkaline Phosphatase: 112 U/L (ref 38–126)
Anion gap: 11 (ref 5–15)
BUN: 9 mg/dL (ref 8–23)
CO2: 24 mmol/L (ref 22–32)
Calcium: 8.8 mg/dL — ABNORMAL LOW (ref 8.9–10.3)
Chloride: 104 mmol/L (ref 98–111)
Creatinine, Ser: 0.75 mg/dL (ref 0.44–1.00)
GFR calc Af Amer: >60 mL/min (ref >60)
GFR calc non Af Amer: >60 mL/min (ref >60)
Glucose, Bld: 109 mg/dL — ABNORMAL HIGH (ref 70–99)
Potassium: 3.9 mmol/L (ref 3.5–5.1)
Sodium: 139 mmol/L (ref 135–145)
Total Bilirubin: 0.7 mg/dL (ref 0.3–1.2)
Total Protein: 6.9 g/dL (ref 6.5–8.1)

## 2019-10-09 MED ORDER — CEFDINIR 300 MG PO CAPS: 300.0000 mg | ORAL_CAPSULE | Freq: Two times a day (BID) | ORAL | 0 refills | Status: AC

## 2019-10-09 MED ORDER — PANTOPRAZOLE SODIUM 40 MG PO TBEC: 40.0000 mg | DELAYED_RELEASE_TABLET | Freq: Two times a day (BID) | ORAL | 3 refills | Status: DC

## 2019-10-09 MED ORDER — LISINOPRIL 40 MG PO TABS: 40.0000 mg | ORAL_TABLET | Freq: Every day | ORAL | 2 refills | Status: DC

## 2019-10-09 NOTE — TOC Transition Note (Addendum)
Transition of Care [facility]) - CM/SW Discharge Note   Patient Details  Name: [name] MRN: [id number] Date of Birth: 1956/08/03  [facility] CM/SW Contact:  Boneta Lucks, RN Phone Number: 10/09/2019, 3:07 PM   Clinical Narrative:   Patient admitted for rectal bleeding. TOC consulted for cab voucher home.  Provided, no other needs.  Patient is active with Home Health. Unsure of the company. Orders placed.   Final next level of care: Home/Self Care Barriers to Discharge: Barriers Resolved   Patient Goals and CMS Choice     Discharge Placement              Patient to be transferred to facility by: cab   Patient and family notified of of transfer: 10/09/19  Discharge Plan and Services     Continue with home health - orders placed to resume.  Readmission Risk Interventions Readmission Risk Prevention Plan 04/13/2019  Transportation Screening Complete  PCP or Specialist Appt within 5-7 Days Complete  Home Care Screening Complete  Medication Review (RN CM) Complete  Some recent data might be hidden

## 2019-10-09 NOTE — Discharge Instructions (Signed)
1)Generalized weakness, GI bleed and liver cirrhosis--??  Medication noncompliance--- a registered nurse will be coming to your house to help you improve your medication compliance 2) please stop taking naproxen/Aleve as this will make you more likely to bleed 3) avoid alcohol completely 4) your Protonix/pantoprazole is being changed to twice a day 5) repeat CBC and CMP blood test with the primary care physician within a week 6)Avoid ibuprofen/Advil/Aleve/Motrin/Goody Powders/Naproxen/BC powders/Meloxicam/Diclofenac/Indomethacin and other Nonsteroidal anti-inflammatory medications as these will make you more likely to bleed and can cause stomach ulcers, can also cause Kidney problems.  7) follow-up with gastroenterologist Dr. Gala Romney for outpatient colonoscopy in 2021 8) please call or return if any concerns about bleeding especially if very dark stools or blood in your stool

## 2019-10-09 NOTE — Discharge Summary (Signed)
[name], is a 63 y.o. female  DOB 27-May-1956  MRN [id number].  Admission date:  10/08/2019  Admitting Physician  Jani Gravel, MD  Discharge Date:  10/09/2019   Primary MD  [name], MD  Recommendations for primary care physician for things to follow:   1)Generalized weakness, GI bleed and liver cirrhosis--??  Medication noncompliance--- a registered nurse will be coming to your house to help you improve your medication compliance 2) please stop taking naproxen/Aleve as this will make you more likely to bleed 3) avoid alcohol completely 4) your Protonix/pantoprazole is being changed to twice a day 5) repeat CBC and CMP blood test with the primary care physician within a week 6)Avoid ibuprofen/Advil/Aleve/Motrin/Goody Powders/Naproxen/BC powders/Meloxicam/Diclofenac/Indomethacin and other Nonsteroidal anti-inflammatory medications as these will make you more likely to bleed and can cause stomach ulcers, can also cause Kidney problems.  7) follow-up with gastroenterologist Dr. Gala Romney for outpatient colonoscopy in 2021 8) please call or return if any concerns about bleeding especially if very dark stools or blood in your stool  Admission Diagnosis  Rectal bleeding [K62.5]   Discharge Diagnosis  Rectal bleeding [K62.5]    Active Problems:   Alcohol abuse   Hypokalemia   Reflux esophagitis   Abnormal liver function   Cirrhosis of liver without ascites (HCC)   Hyponatremia   Rectal bleeding      Past Medical History:  Diagnosis Date  . Alcohol abuse   . Asthma   . Contracture of muscle of hand    right  . GERD (gastroesophageal reflux disease)   . HTN (hypertension)   . PUD (peptic ulcer disease)    remote  . Seizures ([place])    since stroke, no recent seizures  . Stroke [facility])    age 66, required brain surgery    Past Surgical History:  Procedure Laterality Date  . ABDOMINAL  HYSTERECTOMY  2004   complete  . BRAIN SURGERY     age 9, stroke  . COLONOSCOPY    . COLONOSCOPY WITH PROPOFOL  10/03/2012   Dr. [name]: moderate diverticulosis, small internal hemorrhoids, TUBULAR ADENOMA. Next colonoscopy 2023 per Dr. [name].  . ESOPHAGOGASTRODUODENOSCOPY N/A 10/18/2014   Dr. Gala Romney during inpatient hospitalization: severe exudative/erosive reflux esophagitis as source of trivial upper GI bleed. No varices   . ESOPHAGOGASTRODUODENOSCOPY (EGD) WITH PROPOFOL  10/03/2012   Dr. [name]: stricture at South Heart junction s/p dilation, mild gastritis negative H.pylori   . ESOPHAGOGASTRODUODENOSCOPY (EGD) WITH PROPOFOL N/A 12/27/2017   Grade D esophagitis. Medium-sized hiatal hernia, normal duodenum  . right tib-fib fracture  2008  . SAVORY DILATION  10/03/2012   Procedure: SAVORY DILATION;  Surgeon: Danie Binder, MD;  Location: [facility];  Service: Endoscopy;  Laterality: N/A;  started at 1303,  dilated 12.8-16mm       HPI  from the history and physical done on the day of admission:   - [name]  is a 63 y.o. female,63 y.o.female,w hypertension, h/o stroke with R arm contracture, Asthma, ETOH abuse, [name],  PUD, apparently presents w/ c/o brbpr x3.  Pt denies abd pain, n/v, diarrhea, black stool.  Pt denies Nsaid use. Pt has not drank recently. Pt notes prior EGD 12/27/2017=>grade D reflux esophagitis, colonoscopy 10/03/2012=>diverticulosis  In ED,   Wbc 4.5, Hgb 10.3, Plt 126 Na 129, K 3.3,  Bun 10, Creatinine 0.66 Ast 60, Alt 37, Alk phos 131, T. Bili 0.9 INR 1.1  Sars covid-19 pending  FOBT positive,   ED states used anoscope-> no hemorrhroids.    ED requesting admission for rectal bleeding    Hospital Course:        Brief Summary:- MattieDawkinsis a63 y.o.female,63 y.o.female,w hypertension, h/o stroke with R arm contracture, Asthma, ETOH abuse, Gerd, PUD, apparently presents w/ c/obrbpr x3. Pt denies abd pain, n/v, diarrhea, black  stool. Pt denies Nsaid use. Pt has not drank recently. 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----patient was treated empirically with Rocephin for SBP prophylaxis in the setting of rectal bleeding, discharged on [place] -Outpatient management of alcoholic liver cirrhosis advised as outpatient  3)HTN--stable, continue lisinopril  4) history of seizures--- continue Dilantin and phenobarbital  Discharge Condition: stable  Follow UP--- outpatient follow-up with GI for colonoscopy and liver cirrhosis management   Consults obtained - Gi  Diet and Activity recommendation:  As advised  Discharge Instructions    Discharge Instructions    Call MD for:  difficulty breathing, headache or visual disturbances   Complete by: As directed    Call MD for:  persistant dizziness or light-headedness   Complete by: As directed    Call MD for:  persistant nausea and vomiting   Complete by: As directed    Call MD for:  severe uncontrolled pain   Complete by: As directed    Call MD for:  temperature >100.4   Complete by: As directed    Diet - low sodium heart healthy   Complete by: As directed    Discharge instructions   Complete by: As directed    1)Generalized weakness, GI bleed and liver cirrhosis--??  Medication noncompliance--- a registered nurse will be coming to your house to help you improve your medication compliance 2) please stop taking naproxen/Aleve as this will make you more likely to bleed 3) avoid alcohol  completely 4) your Protonix/pantoprazole is being changed to twice a day 5) repeat CBC and CMP blood test with the primary care physician within a week 6)Avoid ibuprofen/Advil/Aleve/Motrin/Goody Powders/Naproxen/BC powders/Meloxicam/Diclofenac/Indomethacin and other Nonsteroidal anti-inflammatory medications as these will make you more likely to bleed and can cause stomach ulcers, can also cause Kidney problems.  7) follow-up with gastroenterologist Dr. Gala Romney for outpatient colonoscopy in 2021 8) please call or return if any concerns about bleeding especially if very dark stools or blood in your stool   Increase activity slowly   Complete by: As directed         Discharge Medications     Allergies as of 10/09/2019   No Known Allergies     Medication List    STOP taking these medications   naproxen 500 MG tablet Commonly known as:  NAPROSYN     TAKE these medications   acetaminophen 325 MG tablet Commonly known as: TYLENOL Take 325-650 mg by mouth every 6 (six) hours as needed.   albuterol (2.5 MG/3ML) 0.083% nebulizer solution Commonly known as: PROVENTIL Take 2.5 mg by nebulization every 6 (six) hours as needed for wheezing or shortness of breath.   albuterol 108 (90 Base) MCG/ACT inhaler Commonly known as: VENTOLIN HFA Inhale 2 puffs into the lungs every 6 (six) hours as needed for wheezing or shortness of breath.   cefdinir 300 MG capsule Commonly known as: OMNICEF Take 1 capsule (300 mg total) by mouth 2 (two) times daily for 7 days.   Ensure Plus Liqd Take 237 mLs by mouth 3 (three) times daily between meals.   folic acid 1 MG tablet Commonly known as: FOLVITE Take 1 tablet (1 mg total) by mouth daily.   HYDROcodone-acetaminophen 5-325 MG tablet Commonly known as: NORCO/VICODIN TAKE (1) TABLET BY MOUTH EVERY (6) HOURS AS NEEDED FOR MODERATE PAIN.   ipratropium-albuterol 0.5-2.5 (3) MG/3ML Soln Commonly known as: DUONEB Take 3 mLs by nebulization every 6  (six) hours as needed (for shortness of breath/wheezing).   Linzess 145 MCG Caps capsule Generic drug: linaclotide Take 145 mcg by mouth daily before breakfast.   lisinopril 40 MG tablet Commonly known as: ZESTRIL Take 1 tablet (40 mg total) by mouth daily.   multivitamin with minerals Tabs tablet Take 1 tablet by mouth daily.   pantoprazole 40 MG tablet Commonly known as: PROTONIX Take 1 tablet (40 mg total) by mouth 2 (two) times daily before a meal. What changed:   when to take this  additional instructions   PHENobarbital 32.4 MG tablet Commonly known as: LUMINAL Take 32.4 mg by mouth 2 (two) times daily.   phenytoin 100 MG ER capsule Commonly known as: DILANTIN Take 100 mg by mouth 3 (three) times daily.   thiamine 100 MG tablet Take 1 tablet (100 mg total) by mouth daily.       Major procedures and Radiology Reports - PLEASE review detailed and final reports for all details, in brief -    No results found.  Micro Results    Recent Results (from the past 240 hour(s))  SARS CORONAVIRUS 2 (TAT 6-24 HRS) Nasopharyngeal Nasopharyngeal Swab     Status: None   Collection Time: 10/08/19  3:35 AM   Specimen: Nasopharyngeal Swab  Result Value Ref Range Status   SARS Coronavirus 2 NEGATIVE NEGATIVE Final    Comment: (NOTE) SARS-CoV-2 target nucleic acids are NOT DETECTED. The SARS-CoV-2 RNA is generally detectable in upper and lower respiratory specimens during the acute phase of infection. Negative results do not preclude SARS-CoV-2 infection, do not rule out co-infections with other pathogens, and should not be used as the sole basis for treatment or other patient management decisions. Negative results must be combined with clinical observations, patient history, and epidemiological information. The expected result is Negative. Fact Sheet for Patients: SugarRoll.be Fact Sheet for Healthcare  Providers: https://www.woods-mathews.com/ This test is not yet approved or cleared by the [place] FDA and  has been authorized for detection and/or diagnosis of SARS-CoV-2 by FDA under an Emergency Use Authorization (EUA). This EUA will remain  in effect (meaning this test can be used) for the duration of the COVID-19 declaration under Section 56 4(b)(1) of the Act, 21 U.S.C. section 360bbb-3(b)(1), unless the authorization is terminated or revoked sooner. Performed at [facility], [address], [place], [place]  [address]        Today   Subjective    Romelia Sieg today has no new concerns,  No Nausea, Vomiting or Diarrhea  No fever  Or chills          Patient has been seen and examined prior to discharge   Objective   Blood pressure 127/61, pulse 82, temperature 98.1 F (36.7 C), resp. rate 18, height _0  (1.626 m), weight 77.1 kg, SpO2 100 %.   Intake/Output Summary (Last 24 hours) at 10/09/2019 1428 Last data filed at 10/09/2019 0900 Gross per 24 hour  Intake 1060 ml  Output 4 ml  Net 1056 ml    Exam Gen:- Awake Alert, no acute distress  HEENT:- [place].AT, No sclera icterus Neck-Supple Neck,No JVD,.  Lungs-  CTAB , good air movement bilaterally  CV- S1, S2 normal, regular Abd-  +ve B.Sounds, Abd Soft, No tenderness,    Extremity/Skin:- No  edema,   good pulses Psych-affect is appropriate, oriented x3 Neuro-no new focal deficits, no tremors    Data Review   CBC w Diff:  Lab Results  Component Value Date   WBC 5.0 10/09/2019   HGB 9.5 (L) 10/09/2019   HCT 29.5 (L) 10/09/2019   PLT 137 (L) 10/09/2019   LYMPHOPCT 29 10/08/2019   MONOPCT 11 10/08/2019   EOSPCT 2 10/08/2019   BASOPCT 1 10/08/2019    CMP:  Lab Results  Component Value Date   NA 139 10/09/2019   K 3.9 10/09/2019   CL 104 10/09/2019   CO2 24 10/09/2019   BUN 9 10/09/2019   CREATININE 0.75 10/09/2019   CREATININE 0.51 08/29/2017   PROT 6.9 10/09/2019    ALBUMIN 3.2 (L) 10/09/2019   BILITOT 0.7 10/09/2019   ALKPHOS 112 10/09/2019   AST 41 10/09/2019   ALT 27 10/09/2019  .   Total Discharge time is about 33 minutes  Roxan Hockey M.D on 10/09/2019 at 2:28 PM  Go to [email or web address] -  for contact info  [facility] - Office  [phone]

## 2019-10-09 NOTE — Progress Notes (Addendum)
Subjective: Reports 2 BMs yesterday afternoon with a small amount of dark red blood. Also reports she thought she had a small little spot of blood on the pad underneath her when it was changed this morning. Spoke with nursing staff and nurse tech who has had patient since 7 pm last night and is working a double. They state patient has not had any bleeding at least since 7 pm last night. No smears of blood on the pad under patient this morning. Patient denies abdominal pain, nausea, vomiting, or melena. Admits to feeling lightheaded/dizzy prior to initial onset of rectal bleeding but none since.   Objective: Vital signs in last 24 hours: Temp:  [98 F (36.7 C)-98.6 F (37 C)] 98.1 F (36.7 C) (12/22 0534) Pulse Rate:  [80-98] 85 (12/22 0534) Resp:  [14-18] 18 (12/22 0534) BP: (92-153)/(46-75) 92/46 (12/22 0534) SpO2:  [98 %-100 %] 100 % (12/22 0534) Last BM Date: 10/08/19 General:   Alert and oriented, pleasant Head:  Normocephalic and atraumatic. Eyes:  No icterus, sclera clear. Conjuctiva pink.  Abdomen:  Bowel sounds present, soft, non-tender, non-distended. No HSM or hernias noted. No rebound or guarding. No masses appreciated  Msk:  Symmetrical without gross deformities.  Extremities:  Without edema. Neurologic:  Alert and  oriented x4;  grossly normal neurologically. Psych:  Normal mood and affect.  Intake/Output from previous day: 12/21 0701 - 12/22 0700 In: 580 [P.O.:480; IV Piggyback:100] Out: 4 [Urine:4] Intake/Output this shift: No intake/output data recorded.  Lab Results: Recent Labs    10/08/19 0141 10/08/19 0837 10/09/19 0449  WBC 4.5 4.1 5.0  HGB 10.3* 9.7* 9.5*  HCT 31.2* 29.6* 29.5*  PLT 126* 106* 137*   BMET Recent Labs    10/08/19 0141 10/08/19 0837 10/09/19 0449  NA 129* 134* 139  K 3.3* 4.0 3.9  CL 94* 98 104  CO2 22 24 24   GLUCOSE 104* 121* 109*  BUN 10 9 9   CREATININE 0.66 0.65 0.75  CALCIUM 8.8* 8.8* 8.8*   LFT Recent Labs   10/08/19 0141 10/08/19 0837 10/09/19 0449  PROT 8.1 7.1 6.9  ALBUMIN 3.7 3.3* 3.2*  AST 60* 49* 41  ALT 37 31 27  ALKPHOS 131* 115 112  BILITOT 0.9 0.8 0.7   PT/INR Recent Labs    10/08/19 0141  LABPROT 14.3  INR 1.1   Hepatitis Panel Recent Labs    10/08/19 0837  HEPBSAG NON REACTIVE  HCVAB NON REACTIVE  HEPAIGM NON REACTIVE  HEPBIGM NON REACTIVE   Assessment: 63 year old female with history of well compensated cirrhosis (MELD Na 7) likely secondary to ETOH, presenting on 10/08/19 with acute onset of several large volume episodes of rectal bleeding and associated cramping. Admits to feeling lightheaded/dizzy prior to onset of rectal bleeding. Resolution of abdominal cramping yesterday. Per patient, she did have 2 BMs yesterday afternoon around 5-6 pm with a very small amount of dark red blood. No overt GI bleeding since. Hgb on admission near baseline at 10.3, now 9.5, which is essentially stable from hemoglobin yesterday at 9.7. INR normal. Denying any NSAIDs or anticoagulation. No syncope. No known varices, with last EGD in 2019. Known diverticula, with last colonoscopy in 2013.  Clinically, symptoms seem most consistent with possible bout of ischemic colitis. Possible diverticular bleed but abdominal discomfort would not typically be present with diverticular origin. Doubt rapid transit upper GI bleed/variceal bleed as no varices were present in 2019. Encouragingly, hemoglobin is stable and patient only had a small  amount of rectal bleeding yesterday evening which I suspect was residual blood from initial onset. Continue to monitor for overt GI bleeding. Will plan to advance diet and hope to discharge in the next 24 hours. She will need outpatient colonoscopy after the first of the year.    She was started on antibiotics empirically for SBP prophylaxis in the setting of GI bleed in patient with cirrhosis.   Plan: Continue to monitor for overt GI bleeding.  Advance to soft  diet.  Rocephin empirically for SBP prophylaxis. Will need to complete total of 7 days of antibiotics. May transition to oral ciprofloxacin 500 mg BID on discharge.  Continue Protonix 40 mg daily.  Hopefully discharge in the next 24 hours.  She will need outpatient colonosocpy after the first of the year.  Ongoing cirrhosis care as outpatient.    LOS: 0 days    10/09/2019, 8:20 AM   Aliene Altes, [facility]  Attending note: I suspect ischemic colitis which is almost always a self-limiting process.  Agree with plans for discharge in the near future.  Colonoscopy the first of the year.

## 2019-10-17 ENCOUNTER — Ambulatory Visit (HOSPITAL_COMMUNITY)
Admission: RE | Admit: 2019-10-17 | Discharge: 2019-10-17 | Disposition: A | Discharge status: Home / Self Care | Payer: Medicare Other | Source: Ambulatory Visit | Attending: Gastroenterology | Admitting: Gastroenterology

## 2019-10-17 ENCOUNTER — Other Ambulatory Visit: Payer: Self-pay

## 2019-10-17 DIAGNOSIS — K7030 Alcoholic cirrhosis of liver without ascites: Secondary | ICD-10-CM | POA: Insufficient documentation

## 2019-10-18 ENCOUNTER — Telehealth: Payer: Self-pay | Admitting: Gastroenterology

## 2019-10-18 NOTE — Telephone Encounter (Signed)
PLEASE CALL PT. HER U/S SHOWS CIRRHOSIS AND GB POLYPS. REPEAT IN 6 MOS.

## 2019-10-18 NOTE — Telephone Encounter (Signed)
Called and line was busy.

## 2019-10-18 NOTE — Telephone Encounter (Signed)
Letter mailed for pt to call for results.

## 2019-10-22 NOTE — Telephone Encounter (Signed)
REMINDER IN EPIC °

## 2019-10-23 ENCOUNTER — Emergency Department (HOSPITAL_COMMUNITY)
Admission: EM | Admit: 2019-10-23 | Discharge: 2019-10-23 | Disposition: A | Discharge status: Home / Self Care | Payer: Medicare Other | Attending: Emergency Medicine | Admitting: Emergency Medicine

## 2019-10-23 ENCOUNTER — Encounter (HOSPITAL_COMMUNITY): Payer: Self-pay

## 2019-10-23 ENCOUNTER — Other Ambulatory Visit: Payer: Self-pay

## 2019-10-23 ENCOUNTER — Emergency Department (HOSPITAL_COMMUNITY): Payer: Medicare Other

## 2019-10-23 DIAGNOSIS — Z8673 Personal history of transient ischemic attack (TIA), and cerebral infarction without residual deficits: Secondary | ICD-10-CM | POA: Insufficient documentation

## 2019-10-23 DIAGNOSIS — Z87891 Personal history of nicotine dependence: Secondary | ICD-10-CM | POA: Insufficient documentation

## 2019-10-23 DIAGNOSIS — I10 Essential (primary) hypertension: Secondary | ICD-10-CM | POA: Diagnosis not present

## 2019-10-23 DIAGNOSIS — R112 Nausea with vomiting, unspecified: Secondary | ICD-10-CM | POA: Diagnosis present

## 2019-10-23 DIAGNOSIS — R1033 Periumbilical pain: Secondary | ICD-10-CM | POA: Diagnosis not present

## 2019-10-23 DIAGNOSIS — Z79899 Other long term (current) drug therapy: Secondary | ICD-10-CM | POA: Insufficient documentation

## 2019-10-23 DIAGNOSIS — R197 Diarrhea, unspecified: Secondary | ICD-10-CM | POA: Insufficient documentation

## 2019-10-23 LAB — COMPREHENSIVE METABOLIC PANEL
ALT: 38 U/L (ref 0–44)
AST: 60 U/L — ABNORMAL HIGH (ref 15–41)
Albumin: 3.8 g/dL (ref 3.5–5.0)
Alkaline Phosphatase: 115 U/L (ref 38–126)
Anion gap: 11 (ref 5–15)
BUN: 9 mg/dL (ref 8–23)
CO2: 23 mmol/L (ref 22–32)
Calcium: 9.2 mg/dL (ref 8.9–10.3)
Chloride: 103 mmol/L (ref 98–111)
Creatinine, Ser: 0.62 mg/dL (ref 0.44–1.00)
GFR calc Af Amer: >60 mL/min (ref >60)
GFR calc non Af Amer: >60 mL/min (ref >60)
Glucose, Bld: 135 mg/dL — ABNORMAL HIGH (ref 70–99)
Potassium: 3.5 mmol/L (ref 3.5–5.1)
Sodium: 137 mmol/L (ref 135–145)
Total Bilirubin: 0.9 mg/dL (ref 0.3–1.2)
Total Protein: 8.5 g/dL — ABNORMAL HIGH (ref 6.5–8.1)

## 2019-10-23 LAB — CBC WITH DIFFERENTIAL/PLATELET
Abs Immature Granulocytes: 0.01 10*3/uL (ref 0.00–0.07)
Basophils Absolute: 0 10*3/uL (ref 0.0–0.1)
Basophils Relative: 1 %
Eosinophils Absolute: 0 10*3/uL (ref 0.0–0.5)
Eosinophils Relative: 0 %
HCT: 32.3 % — ABNORMAL LOW (ref 36.0–46.0)
Hemoglobin: 10.2 g/dL — ABNORMAL LOW (ref 12.0–15.0)
Immature Granulocytes: 0 %
Lymphocytes Relative: 13 %
Lymphs Abs: 0.6 10*3/uL — ABNORMAL LOW (ref 0.7–4.0)
MCH: 29.7 pg (ref 26.0–34.0)
MCHC: 31.6 g/dL (ref 30.0–36.0)
MCV: 94.2 fL (ref 80.0–100.0)
Monocytes Absolute: 0.5 10*3/uL (ref 0.1–1.0)
Monocytes Relative: 10 %
Neutro Abs: 3.6 10*3/uL (ref 1.7–7.7)
Neutrophils Relative %: 76 %
Platelets: 137 10*3/uL — ABNORMAL LOW (ref 150–400)
RBC: 3.43 MIL/uL — ABNORMAL LOW (ref 3.87–5.11)
RDW: 13.4 % (ref 11.5–15.5)
WBC: 4.7 10*3/uL (ref 4.0–10.5)
nRBC: 0 % (ref 0.0–0.2)

## 2019-10-23 LAB — LIPASE, BLOOD: Lipase: 66 U/L — ABNORMAL HIGH (ref 11–51)

## 2019-10-23 MED ORDER — IOHEXOL 300 MG/ML  SOLN
100.0000 mL | Freq: Once | INTRAMUSCULAR | Status: AC | PRN
  Administered 2019-10-23: 100 mL via INTRAVENOUS

## 2019-10-23 MED ORDER — ONDANSETRON HCL 4 MG/2ML IJ SOLN
4.0000 mg | Freq: Once | INTRAMUSCULAR | Status: AC
  Administered 2019-10-23: 4 mg via INTRAVENOUS
  Filled 2019-10-23: qty 2

## 2019-10-23 MED ORDER — MORPHINE SULFATE (PF) 4 MG/ML IV SOLN
4.0000 mg | Freq: Once | INTRAVENOUS | Status: AC
  Administered 2019-10-23: 4 mg via INTRAVENOUS
  Filled 2019-10-23: qty 1

## 2019-10-23 MED ORDER — LACTATED RINGERS IV BOLUS
1000.0000 mL | Freq: Once | INTRAVENOUS | Status: AC
  Administered 2019-10-23: 1000 mL via INTRAVENOUS

## 2019-10-23 MED ORDER — ONDANSETRON HCL 4 MG PO TABS: 4.0000 mg | ORAL_TABLET | Freq: Four times a day (QID) | ORAL | 0 refills | Status: DC

## 2019-10-23 MED ORDER — FAMOTIDINE IN NACL 20-0.9 MG/50ML-% IV SOLN
20.0000 mg | Freq: Once | INTRAVENOUS | Status: AC
  Administered 2019-10-23: 20 mg via INTRAVENOUS
  Filled 2019-10-23: qty 50

## 2019-10-23 MED ORDER — DICYCLOMINE HCL 20 MG PO TABS: 20.0000 mg | ORAL_TABLET | Freq: Four times a day (QID) | ORAL | 0 refills | Status: DC | PRN

## 2019-10-23 NOTE — ED Provider Notes (Signed)
Patient with vomiting and a history of alcohol abuse.  CT scan unremarkable.  Patient improved with fluids and will follow up with her doctor.  She will be given Zofran and told to increase her Gypsy Lore, MD 10/23/19 [phone]

## 2019-10-23 NOTE — Discharge Instructions (Addendum)
Increase your Protonix so you are taking it twice a day and follow-up with your family doctor later this week.  Return if any problem

## 2019-10-23 NOTE — ED Provider Notes (Signed)
[facility] EMERGENCY DEPARTMENT Provider Note   CSN: [id number] Arrival date & time: 10/23/19  1340     History Chief Complaint  Patient presents with  . Emesis    [name] is a 64 y.o. female.  HPI   64 year old female with abdominal pain and nausea/vomiting.  Onset yesterday.  Persistent throughout the night and into today.  Pain is around the bellybutton.  Fairly constant.  Stools.  No blood.  No fevers.  No sick contacts that she is aware of. Hx of ETOH abuse.   Past Medical History:  Diagnosis Date  . Alcohol abuse   . Asthma   . Contracture of muscle of hand    right  . GERD (gastroesophageal reflux disease)   . HTN (hypertension)   . PUD (peptic ulcer disease)    remote  . Seizures ([place])    since stroke, no recent seizures  . Stroke [facility])    age 23, required brain surgery    Patient Active Problem List   Diagnosis Date Noted  . Rectal bleeding 10/08/2019  . Hyponatremia   . Diarrhea 04/11/2019  . Nausea, vomiting, and diarrhea   . ARF (acute renal failure) ([place]) 04/10/2019  . Acute kidney injury ([place]) 01/22/2019  . History of stroke 01/22/2019  . Asthma 01/22/2019  . Gastroenteritis 01/22/2019  . Stroke ([place]) 06/14/2018  . Cirrhosis of liver without ascites ([place]) 04/21/2017  . AKI (acute kidney injury) ([place]) 07/30/2016  . Anemia 11/25/2015  . Abnormal liver function   . Reflux esophagitis   . Sinus tachycardia ([place]) 10/10/2014  . Hypokalemia 10/10/2014  . Seizure disorder ([place]) 10/10/2014  . Alcohol abuse 09/05/2012  . GERD (gastroesophageal reflux disease) 09/05/2012  . Abdominal pain 09/05/2012  . FX CLOSED TIBIA NOS 07/27/2007  . STROKE-With also h/o Aneurysm clipping? 07/25/2007  . SEIZURES 07/25/2007    Past Surgical History:  Procedure Laterality Date  . ABDOMINAL HYSTERECTOMY  2004   complete  . BRAIN SURGERY     age 30, stroke  . COLONOSCOPY    . COLONOSCOPY WITH PROPOFOL  10/03/2012   Dr. [name]: moderate diverticulosis,  small internal hemorrhoids, TUBULAR ADENOMA. Next colonoscopy 2023 per Dr. [name].  . ESOPHAGOGASTRODUODENOSCOPY N/A 10/18/2014   Dr. Gala Romney during inpatient hospitalization: severe exudative/erosive reflux esophagitis as source of trivial upper GI bleed. No varices   . ESOPHAGOGASTRODUODENOSCOPY (EGD) WITH PROPOFOL  10/03/2012   Dr. [name]: stricture at [place] junction s/p dilation, mild gastritis negative H.pylori   . ESOPHAGOGASTRODUODENOSCOPY (EGD) WITH PROPOFOL N/A 12/27/2017   Grade D esophagitis. Medium-sized hiatal hernia, normal duodenum  . right tib-fib fracture  2008  . SAVORY DILATION  10/03/2012   Procedure: SAVORY DILATION;  Surgeon: Danie Binder, MD;  Location: [facility];  Service: Endoscopy;  Laterality: N/A;  started at 1303,  dilated 12.8-16mm     OB History    Gravida  4   Para  3   Term  3   Preterm      AB  1   Living  3     SAB  1   TAB      Ectopic      Multiple      Live Births              Family History  Problem Relation Age of Onset  . Liver disease Sister        etoh  . Colon cancer Neg Hx     Social History  Tobacco Use  . Smoking status: Former Smoker    Packs/day: 0.04    Years: 9.00    Pack years: 0.36    Types: Cigarettes    Quit date: 07/28/2014    Years since quitting: 5.2  . Smokeless tobacco: Never Used  Substance Use Topics  . Alcohol use: Not Currently    Alcohol/week: 35.0 standard drinks    Types: 35 Cans of beer per week    Comment: 12 pack of beer twice per week   . Drug use: No    Home Medications Prior to Admission medications   Medication Sig Start Date End Date Taking? Authorizing Provider  acetaminophen (TYLENOL) 325 MG tablet Take 325-650 mg by mouth every 6 (six) hours as needed.    [provider]  albuterol (PROVENTIL HFA;VENTOLIN HFA) 108 (90 BASE) MCG/ACT inhaler Inhale 2 puffs into the lungs every 6 (six) hours as needed for wheezing or shortness of breath.     [provider]    albuterol (PROVENTIL) (2.5 MG/3ML) 0.083% nebulizer solution Take 2.5 mg by nebulization every 6 (six) hours as needed for wheezing or shortness of breath.    [provider]  ENSURE PLUS (ENSURE PLUS) LIQD Take 237 mLs by mouth 3 (three) times daily between meals.    [provider]  folic acid (FOLVITE) 1 MG tablet Take 1 tablet (1 mg total) by mouth daily. 07/03/19   Carlis Stable, NP  HYDROcodone-acetaminophen (NORCO/VICODIN) 5-325 MG tablet TAKE (1) TABLET BY MOUTH EVERY (6) HOURS AS NEEDED FOR MODERATE PAIN. 10/04/19   Carole Civil, MD  ipratropium-albuterol (DUONEB) 0.5-2.5 (3) MG/3ML SOLN Take 3 mLs by nebulization every 6 (six) hours as needed (for shortness of breath/wheezing).     [provider]  linaclotide (LINZESS) 145 MCG CAPS capsule Take 145 mcg by mouth daily before breakfast.    [provider]  lisinopril (ZESTRIL) 40 MG tablet Take 1 tablet (40 mg total) by mouth daily. 10/09/19   Roxan Hockey, MD  Multiple Vitamin (MULTIVITAMIN WITH MINERALS) TABS tablet Take 1 tablet by mouth daily. 04/14/19   Johnson, Clanford L, MD  pantoprazole (PROTONIX) 40 MG tablet Take 1 tablet (40 mg total) by mouth 2 (two) times daily before a meal. 10/09/19   Emokpae, Courage, MD  PHENobarbital (LUMINAL) 32.4 MG tablet Take 32.4 mg by mouth 2 (two) times daily. 07/13/19   [provider]  phenytoin (DILANTIN) 100 MG ER capsule Take 100 mg by mouth 3 (three) times daily.    [provider]  thiamine 100 MG tablet Take 1 tablet (100 mg total) by mouth daily. 04/14/19   Murlean Iba, MD    Allergies    Patient has no known allergies.  Review of Systems   Review of Systems All systems reviewed and negative, other than as noted in HPI.  Physical Exam Updated Vital Signs BP (!) 158/66 (BP Location: Left Arm)   Pulse (!) 122   Temp 98.9 F (37.2 C) (Oral)   Resp 20   Ht 5\' 4"  (1.626 m)   Wt 76.7 kg   SpO2 97%   BMI 29.01  kg/m   Physical Exam Vitals and nursing note reviewed.  Constitutional:      General: She is not in acute distress.    Appearance: She is well-developed.  HENT:     Head: Normocephalic and atraumatic.  Eyes:     General:        Right eye: No discharge.  Left eye: No discharge.     Conjunctiva/sclera: Conjunctivae normal.  Cardiovascular:     Rate and Rhythm: Regular rhythm. Tachycardia present.     Heart sounds: Normal heart sounds. No murmur. No friction rub. No gallop.   Pulmonary:     Effort: Pulmonary effort is normal. No respiratory distress.     Breath sounds: Normal breath sounds.  Abdominal:     General: There is no distension.     Palpations: Abdomen is soft.     Tenderness: There is abdominal tenderness.     Comments: Periumbilical tenderness without rebound or guarding.  No distention.  Musculoskeletal:        General: No tenderness.     Cervical back: Neck supple.  Skin:    General: Skin is warm and dry.  Neurological:     Mental Status: She is alert.  Psychiatric:        Behavior: Behavior normal.        Thought Content: Thought content normal.     ED Results / Procedures / Treatments   Labs (all labs ordered are listed, but only abnormal results are displayed) Labs Reviewed  COMPREHENSIVE METABOLIC PANEL - Abnormal; Notable for the following components:      Result Value   Glucose, Bld 135 (*)    Total Protein 8.5 (*)    AST 60 (*)    All other components within normal limits  CBC WITH DIFFERENTIAL/PLATELET - Abnormal; Notable for the following components:   RBC 3.43 (*)    Hemoglobin 10.2 (*)    HCT 32.3 (*)    Platelets 137 (*)    Lymphs Abs 0.6 (*)    All other components within normal limits  LIPASE, BLOOD - Abnormal; Notable for the following components:   Lipase 66 (*)    All other components within normal limits    EKG EKG Interpretation  Date/Time:  Tuesday October 23 2019 13:50:59 EST Ventricular Rate:  125 PR Interval:      QRS Duration: 97 QT Interval:  325 QTC Calculation: 469 R Axis:   18 Text Interpretation: Sinus tachycardia Low voltage, precordial leads Abnormal R-wave progression, early transition Baseline wander in lead(s) V3 V5 Confirmed by Virgel Manifold [id number]) on 10/23/2019 2:24:51 PM   Radiology No results found.   CT ABDOMEN PELVIS W CONTRAST  Result Date: 10/23/2019 CLINICAL DATA:  Abdominal pain and distension EXAM: CT ABDOMEN AND PELVIS WITH CONTRAST TECHNIQUE: Multidetector CT imaging of the abdomen and pelvis was performed using the standard protocol following bolus administration of intravenous contrast. CONTRAST:  155mL OMNIPAQUE IOHEXOL 300 MG/ML  SOLN COMPARISON:  04/10/2019, 10/17/2019 FINDINGS: Lower chest: Lung bases are free of acute infiltrate or sizable effusion. Small sliding-type hiatal hernia is noted. Hepatobiliary: Gallbladder is within normal limits. Mild nodularity to the liver is seen similar to that noted on prior ultrasound examination. Pancreas: Unremarkable. No pancreatic ductal dilatation or surrounding inflammatory changes. Spleen: Normal in size without focal abnormality. Adrenals/Urinary Tract: Adrenal glands are within normal limits. Kidneys are well visualized bilaterally. No renal calculi or obstructive changes are seen. Normal excretion of contrast is seen. The bladder is partially distended. Stomach/Bowel: Diverticular change of the colon is noted. No diverticulitis is seen. The appendix is within normal limits. Stomach is within normal limits aside from the small sliding-type hiatal hernia. No small bowel abnormality is seen. Vascular/Lymphatic: Aortic atherosclerosis. No enlarged abdominal or pelvic lymph nodes. Reproductive: Status post hysterectomy. No adnexal masses. Other: No abdominal wall hernia or  abnormality. No abdominopelvic ascites. Musculoskeletal: Degenerative changes of lumbar spine are noted. IMPRESSION: Diverticulosis without evidence of diverticulitis. Mild  cirrhotic changes of the liver. Normal-appearing appendix. No other focal abnormality is noted. Electronically Signed   By: Inez Catalina M.D.   On: 10/23/2019 16:59   US ABDOMEN LIMITED RUQ  Result Date: 10/17/2019 CLINICAL DATA:  Hepatic cirrhosis EXAM: ULTRASOUND ABDOMEN LIMITED RIGHT UPPER QUADRANT COMPARISON:  March 22, 2019 FINDINGS: Gallbladder: Within the gallbladder, there is a 4 mm echogenic focus which neither moves nor shadows, a presumed polyp. There may be 1-2 mm polyps elsewhere. There are no echogenic foci in the gallbladder which move and shadow as is expected with gallstones. No gallbladder wall thickening or pericholecystic fluid. No sonographic Murphy sign noted by sonographer. Common bile duct: Diameter: 4 mm. No intrahepatic or extrahepatic biliary duct dilatation. Liver: No focal lesion identified. Liver contour is subtly nodular with overall increase in liver echogenicity. Portal vein is patent on color Doppler imaging with normal direction of blood flow towards the liver. Other: None. IMPRESSION: 1. Liver again has a somewhat nodular appearance with increased liver echogenicity, findings felt to be indicative of a degree of hepatic cirrhosis. While no focal liver lesions are evident on this study, it must be cautioned that the sensitivity of ultrasound for detection of focal liver lesions is diminished in this circumstance. 2. Apparent gallbladder polyps with largest polyp measuring 4 mm. Per consensus guidelines, polyps of this size do not warrant additional imaging surveillance. No gallstones, gallbladder wall thickening, or pericholecystic fluid. Electronically Signed   By: Lowella Grip III M.D.   On: 10/17/2019 14:28    Procedures Procedures (including critical care time)  Medications Ordered in ED Medications  lactated ringers bolus 1,000 mL (has no administration in time range)  ondansetron (ZOFRAN) injection 4 mg (has no administration in time range)  famotidine  (PEPCID) IVPB 20 mg premix (has no administration in time range)  morphine 4 MG/ML injection 4 mg (has no administration in time range)    ED Course  I have reviewed the triage vital signs and the nursing notes.  Pertinent labs & imaging results that were available during my care of the patient were reviewed by me and considered in my medical decision making (see chart for details).    MDM Rules/Calculators/A&P                     63yF with abdominal pain and n/v. Periumbilical to epigastric TTP w/o peritonitis. Labs, CT, symptomatic tx and reassessment. Care signed out to Dr Roderic Palau at change of shift.   Final Clinical Impression(s) / ED Diagnoses Final diagnoses:  Nausea vomiting and diarrhea  Periumbilical abdominal pain    Rx / DC Orders ED Discharge Orders    None       Virgel Manifold, MD 10/26/19 1409

## 2019-10-23 NOTE — ED Triage Notes (Signed)
Pt brought to ED via [facility]. Pt from home, c/o emesis since last night and abdominal pain around naval. Pt states also had diarrhea. CBG 152 per EMS

## 2019-10-23 NOTE — ED Notes (Signed)
Patient transported to CT 

## 2019-10-30 ENCOUNTER — Telehealth: Payer: Self-pay

## 2019-10-30 NOTE — Telephone Encounter (Signed)
Pt has appointment with Dr. [name] on 11/01/2019.  Received paperwork from [facility] to be reviewed prior to the appointment.  The paperwork is left on Dr. Nona Dell chair in her office.

## 2019-10-30 NOTE — Telephone Encounter (Signed)
REVIEWED-NO ADDITIONAL RECOMMENDATIONS. 

## 2019-11-01 ENCOUNTER — Inpatient Hospital Stay (HOSPITAL_COMMUNITY)
Admission: EM | Admit: 2019-11-01 | Discharge: 2019-11-05 | DRG: 682 | Disposition: A | Discharge status: Home Health | Payer: Medicare Other | Attending: Internal Medicine | Admitting: Internal Medicine

## 2019-11-01 ENCOUNTER — Ambulatory Visit (INDEPENDENT_AMBULATORY_CARE_PROVIDER_SITE_OTHER): Payer: Medicare Other | Admitting: Gastroenterology

## 2019-11-01 ENCOUNTER — Other Ambulatory Visit: Payer: Self-pay

## 2019-11-01 ENCOUNTER — Emergency Department (HOSPITAL_COMMUNITY): Payer: Medicare Other

## 2019-11-01 ENCOUNTER — Encounter (HOSPITAL_COMMUNITY): Payer: Self-pay | Admitting: *Deleted

## 2019-11-01 ENCOUNTER — Encounter: Payer: Self-pay | Admitting: Gastroenterology

## 2019-11-01 DIAGNOSIS — E876 Hypokalemia: Secondary | ICD-10-CM | POA: Diagnosis not present

## 2019-11-01 DIAGNOSIS — K922 Gastrointestinal hemorrhage, unspecified: Secondary | ICD-10-CM

## 2019-11-01 DIAGNOSIS — K2101 Gastro-esophageal reflux disease with esophagitis, with bleeding: Secondary | ICD-10-CM | POA: Diagnosis present

## 2019-11-01 DIAGNOSIS — I639 Cerebral infarction, unspecified: Secondary | ICD-10-CM

## 2019-11-01 DIAGNOSIS — N179 Acute kidney failure, unspecified: Principal | ICD-10-CM | POA: Diagnosis present

## 2019-11-01 DIAGNOSIS — D62 Acute posthemorrhagic anemia: Secondary | ICD-10-CM | POA: Diagnosis present

## 2019-11-01 DIAGNOSIS — Z8379 Family history of other diseases of the digestive system: Secondary | ICD-10-CM

## 2019-11-01 DIAGNOSIS — K7030 Alcoholic cirrhosis of liver without ascites: Secondary | ICD-10-CM | POA: Diagnosis not present

## 2019-11-01 DIAGNOSIS — R1310 Dysphagia, unspecified: Secondary | ICD-10-CM | POA: Diagnosis not present

## 2019-11-01 DIAGNOSIS — G40909 Epilepsy, unspecified, not intractable, without status epilepticus: Secondary | ICD-10-CM

## 2019-11-01 DIAGNOSIS — Z8711 Personal history of peptic ulcer disease: Secondary | ICD-10-CM

## 2019-11-01 DIAGNOSIS — R569 Unspecified convulsions: Secondary | ICD-10-CM

## 2019-11-01 DIAGNOSIS — R269 Unspecified abnormalities of gait and mobility: Secondary | ICD-10-CM

## 2019-11-01 DIAGNOSIS — Z560 Unemployment, unspecified: Secondary | ICD-10-CM

## 2019-11-01 DIAGNOSIS — I10 Essential (primary) hypertension: Secondary | ICD-10-CM | POA: Diagnosis present

## 2019-11-01 DIAGNOSIS — K920 Hematemesis: Secondary | ICD-10-CM | POA: Diagnosis not present

## 2019-11-01 DIAGNOSIS — Z79899 Other long term (current) drug therapy: Secondary | ICD-10-CM

## 2019-11-01 DIAGNOSIS — F1010 Alcohol abuse, uncomplicated: Secondary | ICD-10-CM | POA: Diagnosis present

## 2019-11-01 DIAGNOSIS — Z20822 Contact with and (suspected) exposure to covid-19: Secondary | ICD-10-CM | POA: Diagnosis present

## 2019-11-01 DIAGNOSIS — K7460 Unspecified cirrhosis of liver: Secondary | ICD-10-CM | POA: Diagnosis present

## 2019-11-01 DIAGNOSIS — Z6826 Body mass index (BMI) 26.0-26.9, adult: Secondary | ICD-10-CM

## 2019-11-01 DIAGNOSIS — K219 Gastro-esophageal reflux disease without esophagitis: Secondary | ICD-10-CM

## 2019-11-01 DIAGNOSIS — E663 Overweight: Secondary | ICD-10-CM | POA: Diagnosis present

## 2019-11-01 DIAGNOSIS — J45909 Unspecified asthma, uncomplicated: Secondary | ICD-10-CM | POA: Diagnosis present

## 2019-11-01 DIAGNOSIS — K7040 Alcoholic hepatic failure without coma: Secondary | ICD-10-CM | POA: Diagnosis present

## 2019-11-01 DIAGNOSIS — Z87891 Personal history of nicotine dependence: Secondary | ICD-10-CM

## 2019-11-01 DIAGNOSIS — Z8673 Personal history of transient ischemic attack (TIA), and cerebral infarction without residual deficits: Secondary | ICD-10-CM

## 2019-11-01 LAB — RETICULOCYTES
Immature Retic Fract: 10.6 % (ref 2.3–15.9)
RBC.: 3.96 MIL/uL (ref 3.87–5.11)
Retic Count, Absolute: 59.8 10*3/uL (ref 19.0–186.0)
Retic Ct Pct: 1.5 % (ref 0.4–3.1)

## 2019-11-01 LAB — COMPREHENSIVE METABOLIC PANEL
ALT: 25 U/L (ref 0–44)
AST: 40 U/L (ref 15–41)
Albumin: 3.9 g/dL (ref 3.5–5.0)
Alkaline Phosphatase: 89 U/L (ref 38–126)
Anion gap: 14 (ref 5–15)
BUN: 20 mg/dL (ref 8–23)
CO2: 29 mmol/L (ref 22–32)
Calcium: 9.5 mg/dL (ref 8.9–10.3)
Chloride: 95 mmol/L — ABNORMAL LOW (ref 98–111)
Creatinine, Ser: 2.43 mg/dL — ABNORMAL HIGH (ref 0.44–1.00)
GFR calc Af Amer: 24 mL/min — ABNORMAL LOW (ref >60)
GFR calc non Af Amer: 21 mL/min — ABNORMAL LOW (ref >60)
Glucose, Bld: 146 mg/dL — ABNORMAL HIGH (ref 70–99)
Potassium: 2.9 mmol/L — ABNORMAL LOW (ref 3.5–5.1)
Sodium: 138 mmol/L (ref 135–145)
Total Bilirubin: 1 mg/dL (ref 0.3–1.2)
Total Protein: 8.5 g/dL — ABNORMAL HIGH (ref 6.5–8.1)

## 2019-11-01 LAB — CBC
HCT: 36.6 % (ref 36.0–46.0)
Hemoglobin: 11.5 g/dL — ABNORMAL LOW (ref 12.0–15.0)
MCH: 29 pg (ref 26.0–34.0)
MCHC: 31.4 g/dL (ref 30.0–36.0)
MCV: 92.2 fL (ref 80.0–100.0)
Platelets: 214 10*3/uL (ref 150–400)
RBC: 3.97 MIL/uL (ref 3.87–5.11)
RDW: 13 % (ref 11.5–15.5)
WBC: 6 10*3/uL (ref 4.0–10.5)
nRBC: 0 % (ref 0.0–0.2)

## 2019-11-01 LAB — IRON AND TIBC
Iron: 64 ug/dL (ref 28–170)
Saturation Ratios: 15 % (ref 10.4–31.8)
TIBC: 431 ug/dL (ref 250–450)
UIBC: 367 ug/dL

## 2019-11-01 LAB — FERRITIN: Ferritin: 19 ng/mL (ref 11–307)

## 2019-11-01 LAB — LIPASE, BLOOD: Lipase: 26 U/L (ref 11–51)

## 2019-11-01 LAB — TYPE AND SCREEN
ABO/RH(D): O POS
Antibody Screen: NEGATIVE

## 2019-11-01 LAB — OCCULT BLOOD GASTRIC / DUODENUM (SPECIMEN CUP)
Occult Blood, Gastric: POSITIVE — AB
pH, Gastric: 7

## 2019-11-01 LAB — POC OCCULT BLOOD, ED: Fecal Occult Bld: NEGATIVE

## 2019-11-01 LAB — VITAMIN B12: Vitamin B-12: 868 pg/mL (ref 180–914)

## 2019-11-01 MED ORDER — POTASSIUM CHLORIDE CRYS ER 20 MEQ PO TBCR
20.0000 meq | EXTENDED_RELEASE_TABLET | Freq: Four times a day (QID) | ORAL | Status: AC
  Administered 2019-11-02 (×2): 20 meq via ORAL
  Filled 2019-11-01 (×2): qty 1

## 2019-11-01 MED ORDER — ALBUTEROL SULFATE HFA 108 (90 BASE) MCG/ACT IN AERS
2.0000 | INHALATION_SPRAY | RESPIRATORY_TRACT | Status: DC | PRN
  Administered 2019-11-02: 2 via RESPIRATORY_TRACT
  Filled 2019-11-01: qty 6.7

## 2019-11-01 MED ORDER — SODIUM CHLORIDE 0.9 % IV SOLN: INTRAVENOUS | Status: DC

## 2019-11-01 MED ORDER — ADULT MULTIVITAMIN W/MINERALS CH
1.0000 | ORAL_TABLET | Freq: Every day | ORAL | Status: DC
  Administered 2019-11-02 – 2019-11-05 (×4): 1 via ORAL
  Filled 2019-11-01 (×4): qty 1

## 2019-11-01 MED ORDER — PHENYTOIN 50 MG PO CHEW
100.0000 mg | CHEWABLE_TABLET | Freq: Three times a day (TID) | ORAL | Status: DC
  Administered 2019-11-02: 100 mg via ORAL
  Filled 2019-11-01 (×12): qty 2

## 2019-11-01 MED ORDER — SODIUM CHLORIDE 0.9 % IV BOLUS: 1000.0000 mL | Freq: Once | INTRAVENOUS | Status: DC

## 2019-11-01 MED ORDER — SODIUM CHLORIDE 0.9% FLUSH: 3.0000 mL | Freq: Once | INTRAVENOUS | Status: DC

## 2019-11-01 MED ORDER — FOLIC ACID 1 MG PO TABS
1.0000 mg | ORAL_TABLET | Freq: Every day | ORAL | Status: DC
  Administered 2019-11-02 – 2019-11-05 (×4): 1 mg via ORAL
  Filled 2019-11-01 (×4): qty 1

## 2019-11-01 MED ORDER — ONDANSETRON HCL 4 MG/2ML IJ SOLN
4.0000 mg | Freq: Once | INTRAMUSCULAR | Status: AC
  Administered 2019-11-01: 23:00:00 4 mg via INTRAVENOUS
  Filled 2019-11-01: qty 2

## 2019-11-01 MED ORDER — SODIUM CHLORIDE 0.9% FLUSH: 3.0000 mL | Freq: Two times a day (BID) | INTRAVENOUS | Status: DC

## 2019-11-01 MED ORDER — PANTOPRAZOLE SODIUM 40 MG PO TBEC: DELAYED_RELEASE_TABLET | ORAL | 11 refills | Status: DC

## 2019-11-01 MED ORDER — PANTOPRAZOLE SODIUM 40 MG IV SOLR
40.0000 mg | Freq: Two times a day (BID) | INTRAVENOUS | Status: DC
  Administered 2019-11-02 – 2019-11-05 (×7): 40 mg via INTRAVENOUS
  Filled 2019-11-01 (×7): qty 40

## 2019-11-01 MED ORDER — ONDANSETRON HCL 4 MG/2ML IJ SOLN: 4.0000 mg | Freq: Four times a day (QID) | INTRAMUSCULAR | Status: DC | PRN

## 2019-11-01 MED ORDER — HYDROCODONE-ACETAMINOPHEN 7.5-325 MG/15ML PO SOLN: 10.0000 mL | Freq: Four times a day (QID) | ORAL | 0 refills | Status: DC | PRN

## 2019-11-01 MED ORDER — FAMOTIDINE IN NACL 20-0.9 MG/50ML-% IV SOLN
20.0000 mg | INTRAVENOUS | Status: AC
  Administered 2019-11-01: 23:00:00 20 mg via INTRAVENOUS
  Filled 2019-11-01: qty 50

## 2019-11-01 MED ORDER — MAGNESIUM SULFATE 2 GM/50ML IV SOLN
2.0000 g | INTRAVENOUS | Status: AC
  Administered 2019-11-02: 01:00:00 2 g via INTRAVENOUS
  Filled 2019-11-01: qty 50

## 2019-11-01 MED ORDER — LORAZEPAM 1 MG PO TABS: 1.0000 mg | ORAL_TABLET | ORAL | Status: AC | PRN

## 2019-11-01 MED ORDER — POTASSIUM CHLORIDE IN NACL 20-0.9 MEQ/L-% IV SOLN
INTRAVENOUS | Status: DC
  Filled 2019-11-01 (×2): qty 1000

## 2019-11-01 MED ORDER — PANTOPRAZOLE SODIUM 40 MG IV SOLR
40.0000 mg | INTRAVENOUS | Status: AC
  Administered 2019-11-01: 23:00:00 40 mg via INTRAVENOUS
  Filled 2019-11-01: qty 40

## 2019-11-01 MED ORDER — ONDANSETRON HCL 4 MG PO TABS: ORAL_TABLET | ORAL | 1 refills | Status: DC

## 2019-11-01 MED ORDER — POTASSIUM CHLORIDE 10 MEQ/100ML IV SOLN: 10.0000 meq | Freq: Once | INTRAVENOUS | Status: DC

## 2019-11-01 MED ORDER — LIDOCAINE VISCOUS HCL 2 % MT SOLN: OROMUCOSAL | 1 refills | Status: DC

## 2019-11-01 MED ORDER — LORAZEPAM 2 MG/ML IJ SOLN: 1.0000 mg | INTRAMUSCULAR | Status: AC | PRN

## 2019-11-01 MED ORDER — ONDANSETRON HCL 4 MG PO TABS: 4.0000 mg | ORAL_TABLET | Freq: Four times a day (QID) | ORAL | Status: DC | PRN

## 2019-11-01 MED ORDER — THIAMINE HCL 100 MG PO TABS
100.0000 mg | ORAL_TABLET | Freq: Every day | ORAL | Status: DC
  Administered 2019-11-02 – 2019-11-05 (×4): 100 mg via ORAL
  Filled 2019-11-01 (×4): qty 1

## 2019-11-01 MED ORDER — THIAMINE HCL 100 MG/ML IJ SOLN: 100.0000 mg | Freq: Every day | INTRAMUSCULAR | Status: DC

## 2019-11-01 MED ORDER — PHENOBARBITAL 32.4 MG PO TABS
32.4000 mg | ORAL_TABLET | Freq: Two times a day (BID) | ORAL | Status: DC
  Administered 2019-11-02 – 2019-11-05 (×8): 32.4 mg via ORAL
  Filled 2019-11-01 (×7): qty 1

## 2019-11-01 NOTE — ED Notes (Signed)
ED Provider at bedside. 

## 2019-11-01 NOTE — Patient Instructions (Addendum)
SEE DR. FANTA ASAP TO BE TESTED FOR STREP THROAT.  DRINK WATER TO KEEP YOUR URINE LIGHT YELLOW.  EAT SOFT FOODS OR LIQUIDS UNTIL THE THROAT PAIN RESOLVES. SEE INFO BELOW.   TO RELIEVE THROAT PAIN:    1. SALT WATER GARGLES THREE TIMES A DAY FOR 3 MINS.    2.  USE VISCOUS LIDOCAINE 2 TSP 10 MINS PRIOR TO MEALS AND YOU CAN REPEAT EVERY 4 HOURS WHEN NEEDED FOR FLARES OF HEARTBURN, CHEST PAIN, OR THROAT PAIN. USE A SYRINGE TO INJECT INTO THE BACK OF YOUR THROAT. USE NO MORE THAN 8 DOSES A DAY. IT WILL MAKE YOUR MOUTH, ESOPHAGUS, AND STOMACH NUMB    3. USE HYCET EVERY 4 HOURS WHEN NEEDED FOR ADDITIONAL PAIN RELIEF.   CONTINUE PROTONIX. TAKE 30 MINUTES PRIOR TO MEALS TWICE DAILY.  USE ZOFRAN AS NEEDED TO CONTROL NAUSEA OR VOMITING.   SEE ENT FOR THROAT PAIN IF IT DOES NOT RESOLVE IN 2 WEEKS. THE REFERRAL HAS BEEN MADE.  FOLLOW UP IN 6 MOS.   Full Liquid Diet A high-calorie, high-protein supplement should be used to meet your nutritional requirements when the full liquid diet is continued for more than 2 or 3 days. If this diet is to be used for an extended period of time (more than 7 days), a multivitamin should be considered.  Breads and Starches  Allowed: None are allowed   Avoid: Any others.    Potatoes/Pasta/Rice  Allowed: ANY ITEM AS A SOUP OR SMALL PLATE OF MASHED POTATOES OR SCRAMBLED EGGS. (DO NOT EAT MORE THAN ONE SERVING ON THE DAY BEFORE COLONOSCOPY).      Vegetables  Allowed: Strained tomato or vegetable juice. Vegetables pureed in soup.   Avoid: Any others.    Fruit  Allowed: Any strained fruit juices and fruit drinks. Include 1 serving of citrus or vitamin C-enriched fruit juice daily.   Avoid: Any others.  Meat and Meat Substitutes  Allowed: Egg  Avoid: Any meat, fish, or fowl. All cheese.  Milk  Allowed: SOY Milk beverages, including milk shakes and instant breakfast mixes. Smooth yogurt.   Avoid: Any others. Avoid dairy products if not  tolerated.    Soups and Combination Foods  Allowed: Broth, strained cream soups. Strained, broth-based soups.   Avoid: Any others.    Desserts and Sweets  Allowed: flavored gelatin, tapioca, ice cream, sherbet, smooth pudding, junket, fruit ices, frozen ice pops, pudding pops, frozen fudge pops, chocolate syrup. Sugar, honey, jelly, syrup.   Avoid: Any others.  Fats and Oils  Allowed: Margarine, butter, cream, sour cream, oils.   Avoid: Any others.  Beverages  Allowed: All.   Avoid: None.  Condiments  Allowed: Iodized salt, pepper, spices, flavorings. Cocoa powder.   Avoid: Any others.    SAMPLE MEAL PLAN Breakfast   cup orange juice.   1 OR 2 EGGS  1 cup milk.   1 cup beverage (coffee or tea).   Cream or sugar, if desired.    Midmorning Snack  2 SCRAMBLED OR HARD BOILED EGG   Lunch  1 cup cream soup.    cup fruit juice.   1 cup milk.    cup custard.   1 cup beverage (coffee or tea).   Cream or sugar, if desired.    Midafternoon Snack  1 cup milk shake.  Dinner  1 cup cream soup.    cup fruit juice.   1 cup MILK    cup pudding.   1 cup beverage (coffee  or tea).   Cream or sugar, if desired.  Evening Snack  1 cup supplement.  To increase calories, add sugar, cream, butter, or margarine if possible. Nutritional supplements will also increase the total calories.

## 2019-11-01 NOTE — ED Triage Notes (Signed)
Pt with constipation for past 4-5 days as well.

## 2019-11-01 NOTE — ED Triage Notes (Signed)
Pt with emesis starting today and about 5-6 times per pt.  Pt states it's dark like coffee grounds when asked.  Also c/o abd pain and sore throat.

## 2019-11-01 NOTE — H&P (Signed)
History and Physical    [name] [id number] DOB: [birth year] DOA: 11/01/2019  PCP: [name], MD   Patient coming from: Home   Chief Complaint: Upper abdominal pain, sore throat, coffee-ground emesis   HPI: [name] is a 64 y.o. female with medical history significant for alcohol abuse, liver cirrhosis, history of CVA, seizure disorder, and asthma, now presenting to the emergency department for evaluation of abdominal pain, sore throat, and recurrent episodes coffee-ground emesis.  Patient reports that she has had a sore throat, worse with swallowing, has now been eating much due to this, and has also developed upper abdominal pain, indigestion, and then dark emesis beginning this evening which the patient describes as coffee-ground.  She denies any melena or hematochezia.  She denies chest pain or shortness of breath.  She recently ran out of some of her home medications.  ED Course: Upon arrival to the ED, patient is found to be afebrile, saturating well on room air, tachycardic in the [id number], and with systolic blood pressure of 92.  Chemistry panel is notable for potassium of 2.9 and creatinine 2.43, up from 0.62 earlier this month.  CBC with hemoglobin 11.5.  Gastric occult blood is positive, fecal occult blood is negative.  Type and screen was performed in the emergency department, IV Pepcid and Protonix were administered, and the patient was also treated with Zofran, IV magnesium, and IV fluids.  Gastroenterology is being consulted by the ED physician and hospitalists asked to admit.  Review of Systems:  All other systems reviewed and apart from HPI, are negative.  Past Medical History:  Diagnosis Date  . Alcohol abuse   . Asthma   . Contracture of muscle of hand    right  . GERD (gastroesophageal reflux disease)   . HTN (hypertension)   . PUD (peptic ulcer disease)    remote  . Seizures ([place])    since stroke, no recent seizures  . Stroke [facility])    age 81,  required brain surgery    Past Surgical History:  Procedure Laterality Date  . ABDOMINAL HYSTERECTOMY  2004   complete  . BRAIN SURGERY     age 16, stroke  . COLONOSCOPY    . COLONOSCOPY WITH PROPOFOL  10/03/2012   Dr. [name]: moderate diverticulosis, small internal hemorrhoids, TUBULAR ADENOMA. Next colonoscopy 2023 per Dr. [name].  . ESOPHAGOGASTRODUODENOSCOPY N/A 10/18/2014   Dr. Gala Romney during inpatient hospitalization: severe exudative/erosive reflux esophagitis as source of trivial upper GI bleed. No varices   . ESOPHAGOGASTRODUODENOSCOPY (EGD) WITH PROPOFOL  10/03/2012   Dr. [name]: stricture at [place] junction s/p dilation, mild gastritis negative H.pylori   . ESOPHAGOGASTRODUODENOSCOPY (EGD) WITH PROPOFOL N/A 12/27/2017   Grade D esophagitis. Medium-sized hiatal hernia, normal duodenum  . right tib-fib fracture  2008  . SAVORY DILATION  10/03/2012   Procedure: SAVORY DILATION;  Surgeon: Danie Binder, MD;  Location: [facility];  Service: Endoscopy;  Laterality: N/A;  started at 1303,  dilated 12.8-16mm     reports that she quit smoking about 5 years ago. Her smoking use included cigarettes. She has a 0.36 pack-year smoking history. She has never used smokeless tobacco. She reports previous alcohol use of about 35.0 standard drinks of alcohol per week. She reports that she does not use drugs.  No Known Allergies  Family History  Problem Relation Age of Onset  . Liver disease Sister        etoh  . Colon cancer Neg Hx  Prior to Admission medications   Medication Sig Start Date End Date Taking? Authorizing Provider  albuterol (PROVENTIL HFA;VENTOLIN HFA) 108 (90 BASE) MCG/ACT inhaler Inhale 2 puffs into the lungs every 6 (six) hours as needed for wheezing or shortness of breath.    Yes [provider]  dicyclomine (BENTYL) 20 MG tablet Take 1 tablet (20 mg total) by mouth every 6 (six) hours as needed for spasms. 10/23/19  Yes Virgel Manifold, MD  HYDROcodone-acetaminophen  (HYCET) 7.5-325 mg/15 ml solution Take 10 mLs by mouth 4 (four) times daily as needed for moderate pain. 11/01/19  Yes Fields, Sandi L, MD  lidocaine (XYLOCAINE) 2 % solution 2 TSP  PO QAC AND HS TO PREVENT FOR CHEST PAIN WHILE EATING Patient taking differently: Use as directed 10 mLs in the mouth or throat See admin instructions. BEFORE MEALS AND AT BEDTIME TO PREVENT FOR CHEST PAIN WHILE EATING 11/01/19  Yes Fields, Sandi L, MD  lisinopril (ZESTRIL) 40 MG tablet Take 1 tablet (40 mg total) by mouth daily. 10/09/19  Yes Emokpae, Courage, MD  ondansetron (ZOFRAN) 4 MG tablet 1 PO 30 MINUTES PRIOR TO MEALS TID AND AT BEDTIME OR EVERY 6 HRS TO CONTROL NAUSEA/VOMITING Patient taking differently: Take 4 mg by mouth every 8 (eight) hours as needed for nausea or vomiting.  11/01/19  Yes Fields, Sandi L, MD  pantoprazole (PROTONIX) 40 MG tablet 1 PO 30 MINUTES PRIOR TO MEALS BID FOR 3 MOS THEN QD Patient taking differently: Take 40 mg by mouth 2 (two) times daily before a meal.  11/01/19  Yes Fields, Sandi L, MD  PHENobarbital (LUMINAL) 32.4 MG tablet Take 32.4 mg by mouth 2 (two) times daily. 07/13/19  Yes [provider]  phenytoin (DILANTIN) 100 MG ER capsule Take 100 mg by mouth 3 (three) times daily.   Yes [provider]  acetaminophen (TYLENOL) 325 MG tablet Take 325-650 mg by mouth every 6 (six) hours as needed.    [provider]  albuterol (PROVENTIL) (2.5 MG/3ML) 0.083% nebulizer solution Take 2.5 mg by nebulization every 6 (six) hours as needed for wheezing or shortness of breath.    [provider]  ENSURE PLUS (ENSURE PLUS) LIQD Take 237 mLs by mouth 3 (three) times daily between meals.    [provider]  folic acid (FOLVITE) 1 MG tablet Take 1 tablet (1 mg total) by mouth daily. 07/03/19   Carlis Stable, NP  ipratropium-albuterol (DUONEB) 0.5-2.5 (3) MG/3ML SOLN Take 3 mLs by nebulization every 6 (six) hours as needed (for shortness of breath/wheezing).      [provider]  linaclotide (LINZESS) 145 MCG CAPS capsule Take 145 mcg by mouth daily before breakfast.    [provider]  Multiple Vitamin (MULTIVITAMIN WITH MINERALS) TABS tablet Take 1 tablet by mouth daily. 04/14/19   Johnson, Clanford L, MD  thiamine 100 MG tablet Take 1 tablet (100 mg total) by mouth daily. 04/14/19   Murlean Iba, MD    Physical Exam: Vitals:   11/01/19 2052 11/01/19 2101  BP:  125/65  Pulse:  (!) 122  Resp:  14  Temp:  98.6 F (37 C)  TempSrc:  Oral  SpO2:  100%  Weight: 70.8 kg   Height: 5\' 4"  (1.626 m)     Constitutional: NAD, calm  Eyes: PERTLA, lids and conjunctivae normal ENMT: Mucous membranes are moist. Posterior pharynx clear of any exudate or lesions.   Neck: normal, supple, no masses, no thyromegaly Respiratory:  no wheezing, no crackles. Normal respiratory effort. No accessory muscle use.  Cardiovascular: Rate ~120 and regular. No extremity edema.  Abdomen: tender in epigastrium, soft, no rebound pain or guarding. Bowel sounds active.  Musculoskeletal: no clubbing / cyanosis. No joint deformity upper and lower extremities.   Skin: no significant rashes, lesions, ulcers. Warm, dry, well-perfused. Neurologic: Mild dysarthria. Contracted right arm. Sensation intact.  Psychiatric: Alert, answers basic questions appropriately. Calm, cooperative.    Labs on Admission: I have personally reviewed following labs and imaging studies  CBC: Recent Labs  Lab 11/01/19 2113  WBC 6.0  HGB 11.5*  HCT 36.6  MCV 92.2  PLT [id number]   Basic Metabolic Panel: Recent Labs  Lab 11/01/19 2113  NA 138  K 2.9*  CL 95*  CO2 29  GLUCOSE 146*  BUN 20  CREATININE 2.43*  CALCIUM 9.5   GFR: Estimated Creatinine Clearance: 23.2 mL/min (A) (by C-G formula based on SCr of 2.43 mg/dL (H)). Liver Function Tests: Recent Labs  Lab 11/01/19 2113  AST 40  ALT 25  ALKPHOS 89  BILITOT 1.0  PROT 8.5*  ALBUMIN 3.9   Recent Labs  Lab  11/01/19 2113  LIPASE 26   No results for input(s): AMMONIA in the last 168 hours. Coagulation Profile: No results for input(s): INR, PROTIME in the last 168 hours. Cardiac Enzymes: No results for input(s): CKTOTAL, CKMB, CKMBINDEX, TROPONINI in the last 168 hours. BNP (last 3 results) No results for input(s): PROBNP in the last 8760 hours. HbA1C: No results for input(s): HGBA1C in the last 72 hours. CBG: No results for input(s): GLUCAP in the last 168 hours. Lipid Profile: No results for input(s): CHOL, HDL, LDLCALC, TRIG, CHOLHDL, LDLDIRECT in the last 72 hours. Thyroid Function Tests: No results for input(s): TSH, T4TOTAL, FREET4, T3FREE, THYROIDAB in the last 72 hours. Anemia Panel: Recent Labs    11/01/19 2113  VITAMINB12 868  FERRITIN 19  TIBC 431  IRON 64  RETICCTPCT 1.5   Urine analysis:    Component Value Date/Time   COLORURINE YELLOW 04/11/2019 1614   APPEARANCEUR CLEAR 04/11/2019 1614   LABSPEC 1.004 (L) 04/11/2019 1614   PHURINE 7.0 04/11/2019 1614   GLUCOSEU NEGATIVE 04/11/2019 1614   HGBUR NEGATIVE 04/11/2019 1614   BILIRUBINUR NEGATIVE 04/11/2019 1614   KETONESUR NEGATIVE 04/11/2019 1614   PROTEINUR NEGATIVE 04/11/2019 1614   UROBILINOGEN 0.2 12/26/2014 1927   NITRITE NEGATIVE 04/11/2019 1614   LEUKOCYTESUR NEGATIVE 04/11/2019 1614   Sepsis Labs: @LABRCNTIP (procalcitonin:4,lacticidven:4) )No results found for this or any previous visit (from the past 240 hour(s)).   Radiological Exams on Admission: DG Chest Port 1 View  Result Date: 11/01/2019 CLINICAL DATA:  Vomiting blood EXAM: PORTABLE CHEST 1 VIEW COMPARISON:  01/22/2019 FINDINGS: Heart and mediastinal contours are within normal limits. No focal opacities or effusions. No acute bony abnormality. Degenerative changes in the shoulders. IMPRESSION: No active disease. Electronically Signed   By: Rolm Baptise M.D.   On: 11/01/2019 23:04    Assessment/Plan   1. Acute kidney injury  - SCr is 2.43  in ED, up from 0.62 earlier this month  - Likely acute prerenal azotemia in setting of decreased intake and N/V, compounded by ACE-inhibition  - Renally-dose medication, avoid nephrotoxins, continue IVF hydration, check urine chemistries, repeat chem panel in am   2. Coffee-ground emesis  - Patient reports recurrent episodes of coffee-ground emesis starting evening of presentation, continues in ED  - FOBT is negative, gastric occult blood positive  -  SBP low 90s and HR 120s in ED  - ED physician is consulting with GI  - Continue bowel-rest, IV PPI, IV fluids, repeat CBC in am    3. Cirrhosis  - Presents with upper abdominal pain and coffee-ground emesis  - Attributed to EtOH   - She had esophagitis on EGD from 12/2017 but no comment on varices  - Continue PPI, alcohol avoidance, replace electrolytes   4. EtOH abuse  - Monitor with CIWA, supplement vitamins, use Ativan as needed    5. Hypokalemia  - Serum potassium is 2.9, secondary to GI-losses  - She was given empiric mag in ED  - Replace potassium, continue cardiac monitoring, repeat chem panel in am    6. Seizure disorder  - Pharmacy medication-reconciliation pending, patient denies recent change in seizure medications, will continue Dilantin and phenobarbital    7. Asthma  - No cough or wheezing in ED  - Continue albuterol as needed    8. Hypertension  - BP low-normal in ED and antihypertensives held on admission     DVT prophylaxis: SCD's  Code Status: Full  Family Communication: Discussed with patient  Consults called: GI consulted by ED physician  Admission status: Observation     Vianne Bulls, MD [facility] Pager [phone]  If 7PM-7AM, please contact night-coverage [email or web address] Password [facility]  11/01/2019, 11:42 PM

## 2019-11-01 NOTE — ED Notes (Signed)
Hospitalist at bedside 

## 2019-11-01 NOTE — Assessment & Plan Note (Signed)
SYMPTOMS FAIRLY WELL CONTROLLED.  CONTINUE PROTONIX. TAKE 30 MINUTES PRIOR TO MEALS TWICE DAILY. USE ZOFRAN AS NEEDED TO CONTROL NAUSEA OR VOMITING. FOLLOW UP IN 6 MOS.

## 2019-11-01 NOTE — ED Provider Notes (Signed)
[facility] EMERGENCY DEPARTMENT Provider Note   CSN: [id number] Arrival date & time: 11/01/19  2045     History Chief Complaint  Patient presents with  . Emesis    [name] is a 64 y.o. female.  HPI   This patient is a 64 year old female, she has a known history of cirrhosis and liver failure secondary to alcohol abuse and review of the medical record shows that she was last admitted approximately 3 weeks ago with rectal bleeding.  Upper endoscopy performed on March 12 of 2019 due to dyspepsia because of use of NSAIDs and alcohol showed esophagitis with bleeding hiatal hernia and a normal duodenum.  She was admitted to the hospital October 09, 2019 because of lower GI bleeding.  Was seen by gastroenterology, this was thought to be diverticular and the bleeding stopped spontaneously.  She was discharged home at that time.  Today she has had multiple episodes of emesis, she states she is bringing up coffee grounds, persistently having epigastric pain with black vomit, denies any stools in 4 or 5 days.  She denies to me that she is drinking alcohol anymore though at her most recent admission she did endorse heavy alcohol use.  The symptoms are severe, persistent, nothing seems to make this better or worse  Past Medical History:  Diagnosis Date  . Alcohol abuse   . Asthma   . Contracture of muscle of hand    right  . GERD (gastroesophageal reflux disease)   . HTN (hypertension)   . PUD (peptic ulcer disease)    remote  . Seizures ([place])    since stroke, no recent seizures  . Stroke [facility])    age 63, required brain surgery    Patient Active Problem List   Diagnosis Date Noted  . Rectal bleeding 10/08/2019  . Hyponatremia   . Diarrhea 04/11/2019  . Nausea, vomiting, and diarrhea   . ARF (acute renal failure) ([place]) 04/10/2019  . Acute kidney injury ([place]) 01/22/2019  . History of stroke 01/22/2019  . Asthma 01/22/2019  . Gastroenteritis 01/22/2019  . Stroke ([place])  06/14/2018  . Cirrhosis of liver without ascites ([place]) 04/21/2017  . AKI (acute kidney injury) ([place]) 07/30/2016  . Anemia 11/25/2015  . Abnormal liver function   . Reflux esophagitis   . Sinus tachycardia ([place]) 10/10/2014  . Hypokalemia 10/10/2014  . Seizure disorder ([place]) 10/10/2014  . Alcohol abuse 09/05/2012  . Odynophagia 09/05/2012  . GERD (gastroesophageal reflux disease) 09/05/2012  . Abdominal pain 09/05/2012  . FX CLOSED TIBIA NOS 07/27/2007  . STROKE-With also h/o Aneurysm clipping? 07/25/2007  . SEIZURES 07/25/2007    Past Surgical History:  Procedure Laterality Date  . ABDOMINAL HYSTERECTOMY  2004   complete  . BRAIN SURGERY     age 62, stroke  . COLONOSCOPY    . COLONOSCOPY WITH PROPOFOL  10/03/2012   Dr. [name]: moderate diverticulosis, small internal hemorrhoids, TUBULAR ADENOMA. Next colonoscopy 2023 per Dr. [name].  . ESOPHAGOGASTRODUODENOSCOPY N/A 10/18/2014   Dr. Gala Romney during inpatient hospitalization: severe exudative/erosive reflux esophagitis as source of trivial upper GI bleed. No varices   . ESOPHAGOGASTRODUODENOSCOPY (EGD) WITH PROPOFOL  10/03/2012   Dr. [name]: stricture at [place] junction s/p dilation, mild gastritis negative H.pylori   . ESOPHAGOGASTRODUODENOSCOPY (EGD) WITH PROPOFOL N/A 12/27/2017   Grade D esophagitis. Medium-sized hiatal hernia, normal duodenum  . right tib-fib fracture  2008  . SAVORY DILATION  10/03/2012   Procedure: SAVORY DILATION;  Surgeon: Danie Binder, MD;  Location: [facility];  Service: Endoscopy;  Laterality: N/A;  started at 1303,  dilated 12.8-16mm     OB History    Gravida  4   Para  3   Term  3   Preterm      AB  1   Living  3     SAB  1   TAB      Ectopic      Multiple      Live Births              Family History  Problem Relation Age of Onset  . Liver disease Sister        etoh  . Colon cancer Neg Hx     Social History   Tobacco Use  . Smoking status: Former Smoker    Packs/day: 0.04     Years: 9.00    Pack years: 0.36    Types: Cigarettes    Quit date: 07/28/2014    Years since quitting: 5.2  . Smokeless tobacco: Never Used  Substance Use Topics  . Alcohol use: Not Currently    Alcohol/week: 35.0 standard drinks    Types: 35 Cans of beer per week    Comment: 12 pack of beer twice per week; denied 11/01/19  . Drug use: No    Home Medications Prior to Admission medications   Medication Sig Start Date End Date Taking? Authorizing Provider  albuterol (PROVENTIL HFA;VENTOLIN HFA) 108 (90 BASE) MCG/ACT inhaler Inhale 2 puffs into the lungs every 6 (six) hours as needed for wheezing or shortness of breath.    Yes [provider]  dicyclomine (BENTYL) 20 MG tablet Take 1 tablet (20 mg total) by mouth every 6 (six) hours as needed for spasms. 10/23/19  Yes Virgel Manifold, MD  HYDROcodone-acetaminophen (HYCET) 7.5-325 mg/15 ml solution Take 10 mLs by mouth 4 (four) times daily as needed for moderate pain. 11/01/19  Yes Fields, Sandi L, MD  lidocaine (XYLOCAINE) 2 % solution 2 TSP  PO QAC AND HS TO PREVENT FOR CHEST PAIN WHILE EATING Patient taking differently: Use as directed 10 mLs in the mouth or throat See admin instructions. BEFORE MEALS AND AT BEDTIME TO PREVENT FOR CHEST PAIN WHILE EATING 11/01/19  Yes Fields, Sandi L, MD  lisinopril (ZESTRIL) 40 MG tablet Take 1 tablet (40 mg total) by mouth daily. 10/09/19  Yes Emokpae, Courage, MD  ondansetron (ZOFRAN) 4 MG tablet 1 PO 30 MINUTES PRIOR TO MEALS TID AND AT BEDTIME OR EVERY 6 HRS TO CONTROL NAUSEA/VOMITING Patient taking differently: Take 4 mg by mouth every 8 (eight) hours as needed for nausea or vomiting.  11/01/19  Yes Fields, Sandi L, MD  pantoprazole (PROTONIX) 40 MG tablet 1 PO 30 MINUTES PRIOR TO MEALS BID FOR 3 MOS THEN QD Patient taking differently: Take 40 mg by mouth 2 (two) times daily before a meal.  11/01/19  Yes Fields, Sandi L, MD  PHENobarbital (LUMINAL) 32.4 MG tablet Take 32.4 mg by mouth 2 (two)  times daily. 07/13/19  Yes [provider]  phenytoin (DILANTIN) 100 MG ER capsule Take 100 mg by mouth 3 (three) times daily.   Yes [provider]  acetaminophen (TYLENOL) 325 MG tablet Take 325-650 mg by mouth every 6 (six) hours as needed.    [provider]  albuterol (PROVENTIL) (2.5 MG/3ML) 0.083% nebulizer solution Take 2.5 mg by nebulization every 6 (six) hours as needed for wheezing or shortness of breath.    [provider]  ENSURE PLUS (ENSURE PLUS) LIQD Take 237 mLs by mouth 3 (three) times daily between meals.    [provider]  folic acid (FOLVITE) 1 MG tablet Take 1 tablet (1 mg total) by mouth daily. 07/03/19   Carlis Stable, NP  ipratropium-albuterol (DUONEB) 0.5-2.5 (3) MG/3ML SOLN Take 3 mLs by nebulization every 6 (six) hours as needed (for shortness of breath/wheezing).     [provider]  linaclotide (LINZESS) 145 MCG CAPS capsule Take 145 mcg by mouth daily before breakfast.    [provider]  Multiple Vitamin (MULTIVITAMIN WITH MINERALS) TABS tablet Take 1 tablet by mouth daily. 04/14/19   Johnson, Clanford L, MD  thiamine 100 MG tablet Take 1 tablet (100 mg total) by mouth daily. 04/14/19   Murlean Iba, MD    Allergies    Patient has no known allergies.  Review of Systems   Review of Systems  All other systems reviewed and are negative.   Physical Exam Updated Vital Signs BP 125/65 (BP Location: Left Arm)   Pulse (!) 122   Temp 98.6 F (37 C) (Oral)   Resp 14   Ht 1.626 m (5\' 4" )   Wt 70.8 kg   SpO2 100%   BMI 26.78 kg/m   Physical Exam Vitals and nursing note reviewed.  Constitutional:      Appearance: She is well-developed. She is ill-appearing.  HENT:     Head: Normocephalic and atraumatic.     Mouth/Throat:     Pharynx: No oropharyngeal exudate.  Eyes:     General: No scleral icterus.       Right eye: No discharge.        Left eye: No discharge.     Conjunctiva/sclera:  Conjunctivae normal.     Pupils: Pupils are equal, round, and reactive to light.  Neck:     Thyroid: No thyromegaly.     Vascular: No JVD.  Cardiovascular:     Rate and Rhythm: Regular rhythm. Tachycardia present.     Heart sounds: Normal heart sounds. No murmur. No friction rub. No gallop.      Comments: Tachycardic to 120 bpm Pulmonary:     Effort: Pulmonary effort is normal. No respiratory distress.     Breath sounds: Normal breath sounds. No wheezing or rales.  Abdominal:     General: Bowel sounds are normal. There is no distension.     Palpations: Abdomen is soft. There is no mass.     Tenderness: There is abdominal tenderness.     Comments: Tenderness in the epigastrium  Musculoskeletal:        General: No tenderness. Normal range of motion.     Cervical back: Normal range of motion and neck supple.  Lymphadenopathy:     Cervical: No cervical adenopathy.  Skin:    General: Skin is warm and dry.     Findings: No erythema or rash.  Neurological:     Mental Status: She is alert.     Coordination: Coordination normal.     Comments: The patient is able to move all 4 extremities but has some difficulty moving the right upper extremity secondary to history of stroke at the age of 66, this is baseline  Psychiatric:        Behavior: Behavior normal.     ED Results / Procedures / Treatments   Labs (all labs ordered are listed, but only abnormal results are displayed) Labs Reviewed  COMPREHENSIVE METABOLIC PANEL -  Abnormal; Notable for the following components:      Result Value   Potassium 2.9 (*)    Chloride 95 (*)    Glucose, Bld 146 (*)    Creatinine, Ser 2.43 (*)    Total Protein 8.5 (*)    GFR calc non Af Amer 21 (*)    GFR calc Af Amer 24 (*)    All other components within normal limits  CBC - Abnormal; Notable for the following components:   Hemoglobin 11.5 (*)    All other components within normal limits  RESPIRATORY PANEL BY RT PCR (FLU A&B, COVID)  LIPASE,  BLOOD  RETICULOCYTES  URINALYSIS, ROUTINE W REFLEX MICROSCOPIC  VITAMIN B12  FOLATE  IRON AND TIBC  FERRITIN  OCCULT BLOOD X 1 CARD TO LAB, STOOL  OCCULT BLOOD GASTRIC / DUODENUM (SPECIMEN CUP)  SODIUM, URINE, RANDOM  CREATININE, URINE, RANDOM  TYPE AND SCREEN    EKG None  Radiology DG Chest Port 1 View  Result Date: 11/01/2019 CLINICAL DATA:  Vomiting blood EXAM: PORTABLE CHEST 1 VIEW COMPARISON:  01/22/2019 FINDINGS: Heart and mediastinal contours are within normal limits. No focal opacities or effusions. No acute bony abnormality. Degenerative changes in the shoulders. IMPRESSION: No active disease. Electronically Signed   By: Rolm Baptise M.D.   On: 11/01/2019 23:04    Procedures Procedures (including critical care time)  Medications Ordered in ED Medications  sodium chloride flush (NS) 0.9 % injection 3 mL (has no administration in time range)  famotidine (PEPCID) IVPB 20 mg premix (has no administration in time range)  0.9 %  sodium chloride infusion (has no administration in time range)  magnesium sulfate IVPB 2 g 50 mL (has no administration in time range)  potassium chloride 10 mEq in 100 mL IVPB (has no administration in time range)  pantoprazole (PROTONIX) injection 40 mg (40 mg Intravenous Given 11/01/19 2306)  ondansetron (ZOFRAN) injection 4 mg (4 mg Intravenous Given 11/01/19 2306)    ED Course  I have reviewed the triage vital signs and the nursing notes.  Pertinent labs & imaging results that were available during my care of the patient were reviewed by me and considered in my medical decision making (see chart for details).    MDM Rules/Calculators/A&P                      Labs reveal that the patient has an acute kidney injury with a creatinine of 2.4, potassium is 2.9, hemoglobin is 11   Good since she was recently below 10 at her most recent admission.    We will start Protonix, Pepcid, IV fluids, she may need consultation with gastroenterology,  would consider this to be upper GI bleed until proven otherwise.  Gastroccult sent, labs appreciated, discussed with Dr. [name] of the hospitalist service who will admit the patient to the hospital.  Awaiting return of gastroenterology call  CHERRICE WERKMEISTER was evaluated in Emergency Department on 11/01/2019 for the symptoms described in the history of present illness. She was evaluated in the context of the global COVID-19 pandemic, which necessitated consideration that the patient might be at risk for infection with the SARS-CoV-2 virus that causes COVID-19. Institutional protocols and algorithms that pertain to the evaluation of patients at risk for COVID-19 are in a state of rapid change based on information released by regulatory bodies including the CDC and federal and state organizations. These policies and algorithms were followed during the patient's care in the  ED.   Final Clinical Impression(s) / ED Diagnoses Final diagnoses:  Upper GI bleed  Acute renal failure, unspecified acute renal failure type ([place])  Hypokalemia    Rx / DC Orders ED Discharge Orders    None       Noemi Chapel, MD 11/01/19 2309

## 2019-11-01 NOTE — Progress Notes (Addendum)
Subjective:    Patient ID: [name], female    DOB: April 16, 1956, 64 y.o.   MRN: [id number]  Lauren Fire, MD   HPI  PT HAS A PAST MEDICAL HISTORY OF R HAND CONTRACTURE, AND STROKE. CURRENTLY HAVING LEFT WRIST PAIN REQUIRING SPLINITNG. PT CURRENTLY HAVING DIFFICULTY WITH [facility] ADMISSION DEC 2020 FOR RECTAL BLEEDING. FEELS WEAK IN LEGS EVERY DAY. SHE HAS AN BEEN UNSTEADY ON HER FEET AND REQUIRES AIDE TO ASSIST WITH AMBULATION SINCE BEING HOSPITALIZED IN DEC 2020 INSPITE OF USING A CANE HELD IN HER LEFT HAND. CAN'T WALK MORE THAN 3 FEET WITHOUT ASSISTANCE.SHE HAS FALLEN OCCASIONALLY BUT NO HEAD INJURY. DIFFICULT TO USE CANE DUE TO CONTRACTURE OF RIGHT HAND & AIDE WITNESSES/CONFIRMS PT HAVING DIFFICULTY WITH AMBULATION.   HAVING TROUBLE WITH THROAT PAIN AND LEFT ARM GIVING HER TROUBLE. SHE'S OUT OF HER STOMACH PILLS. HAVING TROUBLE WITH CONSTIPATION. HURTS WHEN SHE SWALLOWS. [name] 2020. NO VOMITING NOW. HEARTBURN: EVERY DAY. BEEN OUT OF PROTONIX THIS AM. ZOFRAN EVERY 6 HRS. NO ETOH. STOOL HAD BLOOD IN IT ONE TIME. NO BMs. USES INHALER RIGHT MUCH. FEELS CONSTIPATION  BUT NOT REALLY EATING. EATS LIGHT STUFF AND HAD SORE THROAT SINCE 2 WEEKS. FEELS LIKE HER GLANDS ARE SWOLLEN. HEARTBURN: 3-4 TIMES A WEEK. TROUBLE WITH DROOLING FOR PAST COUPLE OF WEEKS. CT ABD/[facility] JAN 2021: NO ACUTE INTRAABDOMINAL PROCESS.  PT DENIES FEVER, CHILLS, HEMATOCHEZIA, HEMATEMESIS, melena, diarrhea, CHEST PAIN, SHORTNESS OF BREATH, CHANGE IN BOWEL IN HABITS, constipation, OR abdominal pain.  Past Medical History:  Diagnosis Date  . Alcohol abuse   . Asthma   . Contracture of muscle of hand    right  . GERD (gastroesophageal reflux disease)   . HTN (hypertension)   . PUD (peptic ulcer disease)    remote  . Seizures ([name])    since stroke, no recent seizures  . Stroke [facility])    age 35, required brain surgery    Past Surgical History:  Procedure Laterality Date  . ABDOMINAL  HYSTERECTOMY  2004   complete  . BRAIN SURGERY     age 83, stroke  . COLONOSCOPY    . COLONOSCOPY WITH PROPOFOL  10/03/2012   Dr. [name]: moderate diverticulosis, small internal hemorrhoids, TUBULAR ADENOMA. Next colonoscopy 2023 per Dr. [name].  . ESOPHAGOGASTRODUODENOSCOPY N/A 10/18/2014   Dr. Gala Romney during inpatient hospitalization: severe exudative/erosive reflux esophagitis as source of trivial upper GI bleed. No varices   . ESOPHAGOGASTRODUODENOSCOPY (EGD) WITH PROPOFOL  10/03/2012   Dr. [name]: stricture at [place] junction s/p dilation, mild gastritis negative H.pylori   . ESOPHAGOGASTRODUODENOSCOPY (EGD) WITH PROPOFOL N/A 12/27/2017   Grade D esophagitis. Medium-sized hiatal hernia, normal duodenum  . right tib-fib fracture  2008  . SAVORY DILATION  10/03/2012   Procedure: SAVORY DILATION;  Surgeon: Danie Binder, MD;  Location: [facility];  Service: Endoscopy;  Laterality: N/A;  started at 1303,  dilated 12.8-16mm   No Known Allergies  Current Outpatient Medications  Medication Sig    . acetaminophen (TYLENOL) 325 MG tablet Take 325-650 mg by mouth every 6 (six) hours as needed.    Marland Kitchen albuterol (PROVENTIL HFA;VENTOLIN HFA) 108 (90 BASE) MCG/ACT inhaler Inhale 2 puffs into the lungs every 6 (six) hours as needed for wheezing or shortness of breath.     Marland Kitchen albuterol (PROVENTIL) (2.5 MG/3ML) 0.083% nebulizer solution Take 2.5 mg by nebulization every 6 (six) hours as needed for wheezing or shortness of breath.    . dicyclomine (  BENTYL) 20 MG tablet Take 1 tablet (20 mg total) by mouth every 6 (six) hours as needed for spasms.    . ENSURE PLUS (ENSURE PLUS) LIQD Take 237 mLs by mouth 3 (three) times daily between meals.    . folic acid (FOLVITE) 1 MG tablet Take 1 tablet (1 mg total) by mouth daily.    Marland Kitchen HYDROcodone-acetaminophen (NORCO/VICODIN) 5-325 MG tablet TAKE (1) TABLET BY MOUTH EVERY (6) HOURS AS NEEDED FOR MODERATE PAIN.    Marland Kitchen ipratropium-albuterol (DUONEB) 0.5-2.5 (3) MG/3ML SOLN Take 3  mLs by nebulization every 6 (six) hours as needed (for shortness of breath/wheezing).     Marland Kitchen linaclotide (LINZESS) 145 MCG CAPS capsule Take 145 mcg by mouth daily before breakfast.    . lisinopril (ZESTRIL) 40 MG tablet Take 1 tablet (40 mg total) by mouth daily.    . Multiple Vitamin (MULTIVITAMIN WITH MINERALS) TABS tablet Take 1 tablet by mouth daily.    .      . ondansetron (ZOFRAN) 4 MG tablet Take 1 tablet (4 mg total) by mouth every 6 (six) hours.    . pantoprazole (PROTONIX) 40 MG tablet Take 1 tablet (40 mg total) by mouth 2 (two) times daily before a meal.    . PHENobarbital (LUMINAL) 32.4 MG tablet Take 32.4 mg by mouth 2 (two) times daily.    . phenytoin (DILANTIN) 100 MG ER capsule Take 100 mg by mouth 3 (three) times daily.    Marland Kitchen thiamine 100 MG tablet Take 1 tablet (100 mg total) by mouth daily.     Review of Systems PER HPI OTHERWISE ALL SYSTEMS ARE NEGATIVE.    Objective:   Physical Exam Constitutional:      Appearance: Normal appearance.     Comments: MILD DISTRESS  HENT:     Mouth/Throat:     Pharynx: Posterior oropharyngeal erythema (POSTERIOR THROAT) present.     Comments: MASK IN PLACE BUT THROAT EXAMINED. Eyes:     General: No scleral icterus.    Pupils: Pupils are equal, round, and reactive to light.  Neck:     Comments: NO MASSES APPRECIATED Cardiovascular:     Rate and Rhythm: Normal rate and regular rhythm.     Pulses: Normal pulses.     Heart sounds: Normal heart sounds.  Pulmonary:     Effort: Pulmonary effort is normal.     Breath sounds: Normal breath sounds.  Abdominal:     General: Bowel sounds are normal.     Palpations: Abdomen is soft.     Tenderness: There is no abdominal tenderness.  Musculoskeletal:        General: Deformity (R HAND) present.     Cervical back: Normal range of motion. Tenderness (BILATERAL SUBMANDIBULAR REGION) present.     Right lower leg: Edema (1+) present.     Left lower leg: Left lower leg edema: 1+     Comments:  WALKS ASSISTED WITH A CANE, GAIT PATTERN SHUFFLING. RUE: LIMITED RANGE OF MOTION, UNABLE TO ASSESS STRENGTH, LUE: BRACE IN PLACE DUE TO WRIST PAIN, STRENGTH: 2/5, RANGE OF MOTION: ELBOW:NONE, WRIST: 30 DEGREES  FLEXION/EXTENSION; LLE: STRENGTH 2/5, LIMITED MOTION AT KNEE: FLEXION/EXTENSION 60 DEGREES, RLE STRENGTH: 2/5,  LIMITED MOTION AT KNEE: FLEXION/EXTENSION 60 DEGREES, RIGHT WRIST: NO PAIN WITH MOVEMENT, L WRIST: PAIN WITH FLEXION AND EXTENSION.  WITNESSED INABILITY OF PT TO AMBULATE USING JUST A CANE AND REQUIRING NEED FOR ONE TO TWO PERSON ASSIST WITH AMBULATION.  Lymphadenopathy:     Cervical: No cervical  adenopathy.  Skin:    General: Skin is warm and dry.  Neurological:     Mental Status: She is alert and oriented to person, place, and time.     Motor: Weakness present.     Coordination: Coordination abnormal.     Gait: Gait abnormal.     Comments: REQUIRES ASSISTANCE WITH AMBULATION, WIDE BASED GAIT, UNSTEADY ON HER FEET, RUE HEMIPLEGIA. ABLE TO TRANSFER WITH ASSISTANCE TO THE EXAM TABLE.  Psychiatric:     Comments: FLAT AFFECT, DEPRESSED MOOD       Assessment & Plan:

## 2019-11-01 NOTE — Assessment & Plan Note (Signed)
ACUTE ONSET SYMPTOMS NOT CONTROLLED AND MAY BE DUE TO STREP THROAT.   SEE DR. FANTA ASAP TO BE TESTED FOR STREP THROAT. DRINK WATER TO KEEP YOUR URINE LIGHT YELLOW. EAT SOFT FOODS OR LIQUIDS UNTIL THE THROAT PAIN RESOLVES. SEE INFO BELOW.  TO RELIEVE THROAT PAIN:    1. SALT WATER GARGLES THREE TIMES A DAY FOR 3 MINS.   2.  USE VISCOUS LIDOCAINE 2 TSP 10 MINS PRIOR TO MEALS AND YOU CAN REPEAT EVERY 4 HOURS WHEN NEEDED FOR FLARES OF HEARTBURN, CHEST PAIN, OR THROAT PAIN. USE A SYRINGE TO INJECT INTO THE BACK OF YOUR THROAT. USE NO MORE THAN 8 DOSES A DAY. IT WILL MAKE YOUR MOUTH, ESOPHAGUS, AND STOMACH NUMB   3. USE HYCET EVERY 4 HOURS WHEN NEEDED FOR ADDITIONAL PAIN RELIEF.  SEE ENT FOR THROAT PAIN IF IT DOES NOT RESOLVE IN 2 WEEKS. THE REFERRAL HAS BEEN MADE. FOLLOW UP IN 6 MOS.

## 2019-11-02 DIAGNOSIS — I10 Essential (primary) hypertension: Secondary | ICD-10-CM | POA: Diagnosis present

## 2019-11-02 DIAGNOSIS — F1010 Alcohol abuse, uncomplicated: Secondary | ICD-10-CM | POA: Diagnosis present

## 2019-11-02 DIAGNOSIS — Z20822 Contact with and (suspected) exposure to covid-19: Secondary | ICD-10-CM | POA: Diagnosis present

## 2019-11-02 DIAGNOSIS — Z79899 Other long term (current) drug therapy: Secondary | ICD-10-CM | POA: Diagnosis not present

## 2019-11-02 DIAGNOSIS — G40909 Epilepsy, unspecified, not intractable, without status epilepticus: Secondary | ICD-10-CM | POA: Diagnosis present

## 2019-11-02 DIAGNOSIS — Z560 Unemployment, unspecified: Secondary | ICD-10-CM | POA: Diagnosis not present

## 2019-11-02 DIAGNOSIS — R1310 Dysphagia, unspecified: Secondary | ICD-10-CM | POA: Diagnosis present

## 2019-11-02 DIAGNOSIS — E663 Overweight: Secondary | ICD-10-CM

## 2019-11-02 DIAGNOSIS — Z87891 Personal history of nicotine dependence: Secondary | ICD-10-CM | POA: Diagnosis not present

## 2019-11-02 DIAGNOSIS — Z8711 Personal history of peptic ulcer disease: Secondary | ICD-10-CM | POA: Diagnosis not present

## 2019-11-02 DIAGNOSIS — K7030 Alcoholic cirrhosis of liver without ascites: Secondary | ICD-10-CM | POA: Diagnosis present

## 2019-11-02 DIAGNOSIS — K7040 Alcoholic hepatic failure without coma: Secondary | ICD-10-CM | POA: Diagnosis present

## 2019-11-02 DIAGNOSIS — Z8379 Family history of other diseases of the digestive system: Secondary | ICD-10-CM | POA: Diagnosis not present

## 2019-11-02 DIAGNOSIS — K920 Hematemesis: Secondary | ICD-10-CM | POA: Diagnosis present

## 2019-11-02 DIAGNOSIS — K2101 Gastro-esophageal reflux disease with esophagitis, with bleeding: Secondary | ICD-10-CM | POA: Diagnosis present

## 2019-11-02 DIAGNOSIS — D62 Acute posthemorrhagic anemia: Secondary | ICD-10-CM | POA: Diagnosis present

## 2019-11-02 DIAGNOSIS — J45909 Unspecified asthma, uncomplicated: Secondary | ICD-10-CM | POA: Diagnosis present

## 2019-11-02 DIAGNOSIS — R269 Unspecified abnormalities of gait and mobility: Secondary | ICD-10-CM | POA: Insufficient documentation

## 2019-11-02 DIAGNOSIS — Z6826 Body mass index (BMI) 26.0-26.9, adult: Secondary | ICD-10-CM | POA: Diagnosis not present

## 2019-11-02 DIAGNOSIS — N179 Acute kidney failure, unspecified: Secondary | ICD-10-CM | POA: Diagnosis present

## 2019-11-02 DIAGNOSIS — E876 Hypokalemia: Secondary | ICD-10-CM | POA: Diagnosis present

## 2019-11-02 DIAGNOSIS — K7460 Unspecified cirrhosis of liver: Secondary | ICD-10-CM | POA: Diagnosis not present

## 2019-11-02 DIAGNOSIS — K2100 Gastro-esophageal reflux disease with esophagitis, without bleeding: Secondary | ICD-10-CM | POA: Diagnosis present

## 2019-11-02 DIAGNOSIS — Z8673 Personal history of transient ischemic attack (TIA), and cerebral infarction without residual deficits: Secondary | ICD-10-CM | POA: Diagnosis not present

## 2019-11-02 LAB — URINALYSIS, ROUTINE W REFLEX MICROSCOPIC
Bilirubin Urine: NEGATIVE
Glucose, UA: NEGATIVE mg/dL
Hgb urine dipstick: NEGATIVE
Ketones, ur: NEGATIVE mg/dL
Leukocytes,Ua: NEGATIVE
Nitrite: NEGATIVE
Protein, ur: NEGATIVE mg/dL
Specific Gravity, Urine: 1.014 (ref 1.005–1.030)
pH: 5 (ref 5.0–8.0)

## 2019-11-02 LAB — HEMOGLOBIN AND HEMATOCRIT, BLOOD
HCT: 33.8 % — ABNORMAL LOW (ref 36.0–46.0)
HCT: 32.6 % — ABNORMAL LOW (ref 36.0–46.0)
Hemoglobin: 10.3 g/dL — ABNORMAL LOW (ref 12.0–15.0)
Hemoglobin: 10 g/dL — ABNORMAL LOW (ref 12.0–15.0)

## 2019-11-02 LAB — RESPIRATORY PANEL BY RT PCR (FLU A&B, COVID)
Influenza A by PCR: NEGATIVE
Influenza B by PCR: NEGATIVE
SARS Coronavirus 2 by RT PCR: NEGATIVE

## 2019-11-02 LAB — CREATININE, URINE, RANDOM: Creatinine, Urine: 165.53 mg/dL

## 2019-11-02 LAB — COMPREHENSIVE METABOLIC PANEL
ALT: 20 U/L (ref 0–44)
AST: 26 U/L (ref 15–41)
Albumin: 2.9 g/dL — ABNORMAL LOW (ref 3.5–5.0)
Alkaline Phosphatase: 71 U/L (ref 38–126)
Anion gap: 7 (ref 5–15)
BUN: 23 mg/dL (ref 8–23)
CO2: 25 mmol/L (ref 22–32)
Calcium: 8.5 mg/dL — ABNORMAL LOW (ref 8.9–10.3)
Chloride: 108 mmol/L (ref 98–111)
Creatinine, Ser: 1.55 mg/dL — ABNORMAL HIGH (ref 0.44–1.00)
GFR calc Af Amer: 41 mL/min — ABNORMAL LOW (ref >60)
GFR calc non Af Amer: 36 mL/min — ABNORMAL LOW (ref >60)
Glucose, Bld: 120 mg/dL — ABNORMAL HIGH (ref 70–99)
Potassium: 3.9 mmol/L (ref 3.5–5.1)
Sodium: 140 mmol/L (ref 135–145)
Total Bilirubin: 0.7 mg/dL (ref 0.3–1.2)
Total Protein: 6.6 g/dL (ref 6.5–8.1)

## 2019-11-02 LAB — GROUP A STREP BY PCR: Group A Strep by PCR: NOT DETECTED

## 2019-11-02 LAB — CBC
HCT: 30.5 % — ABNORMAL LOW (ref 36.0–46.0)
Hemoglobin: 9.4 g/dL — ABNORMAL LOW (ref 12.0–15.0)
MCH: 29.2 pg (ref 26.0–34.0)
MCHC: 30.8 g/dL (ref 30.0–36.0)
MCV: 94.7 fL (ref 80.0–100.0)
Platelets: 141 10*3/uL — ABNORMAL LOW (ref 150–400)
RBC: 3.22 MIL/uL — ABNORMAL LOW (ref 3.87–5.11)
RDW: 13.2 % (ref 11.5–15.5)
WBC: 7.2 10*3/uL (ref 4.0–10.5)
nRBC: 0 % (ref 0.0–0.2)

## 2019-11-02 LAB — SODIUM, URINE, RANDOM: Sodium, Ur: 73 mmol/L

## 2019-11-02 LAB — PROTIME-INR
INR: 1.1 (ref 0.8–1.2)
Prothrombin Time: 14.3 seconds (ref 11.4–15.2)

## 2019-11-02 LAB — FOLATE: Folate: 38.8 ng/mL (ref >5.9)

## 2019-11-02 MED ORDER — SODIUM CHLORIDE 0.9 % IV SOLN
2.0000 g | INTRAVENOUS | Status: DC
  Administered 2019-11-02 – 2019-11-05 (×4): 2 g via INTRAVENOUS
  Filled 2019-11-02 (×4): qty 20

## 2019-11-02 MED ORDER — PHENYTOIN SODIUM EXTENDED 100 MG PO CAPS
100.0000 mg | ORAL_CAPSULE | Freq: Three times a day (TID) | ORAL | Status: DC
  Administered 2019-11-02 – 2019-11-05 (×10): 100 mg via ORAL
  Filled 2019-11-02 (×10): qty 1

## 2019-11-02 MED ORDER — SODIUM CHLORIDE 0.9 % IV SOLN: INTRAVENOUS | Status: DC

## 2019-11-02 MED ORDER — CHLORHEXIDINE GLUCONATE CLOTH 2 % EX PADS
6.0000 | MEDICATED_PAD | Freq: Every day | CUTANEOUS | Status: DC
  Administered 2019-11-02 – 2019-11-04 (×3): 6 via TOPICAL

## 2019-11-02 NOTE — ED Notes (Signed)
Pts Caregiver contacted to update on Pts status. Pts caregiver can be contacted at [phone].

## 2019-11-02 NOTE — Consult Note (Signed)
Referring Provider: No ref. provider found Primary Care Physician:  [name], MD Primary Gastroenterologist:  Dr. [name]  Date of Admission: 10/22/19 Date of Consultation: 11/02/19  Reason for Consultation:  UGI Bleed  HPI:  [name] is a 64 y.o. female with a past medical history of alcohol abuse, GERD, peptic ulcer disease, stroke at age 81, seizures, liver cirrhosis.  She was just seen in our office yesterday for odynophagia and GERD.  Recommended testing for strep throat with primary care, viscous lidocaine.  Continue Protonix twice daily and Zofran as needed for nausea/vomiting.  Follow-up in 6 months.  The patient presented to the ED yesterday evening with coffee-ground emesis.  Denied melena and hematochezia.  She was tachycardic on arrival at [id number] with systolic blood pressure of 92, otherwise vitals normal.  CMP with hypokalemia at 2.9 and creatinine bumped at 2.43.  CBC with hemoglobin of 11.5.  Gastric occult blood positive, fecal occult blood negative.  She was started on IV Pepcid and Protonix as well as Zofran, magnesium, fluids.  GI consulted and hospitalist asked to admit.  Last EGD completed 01/03/2018 which found LA grade D esophagitis with bleeding, medium hiatal hernia, otherwise normal.  Recommended to avoid alcohol and continue omeprazole twice daily.  Of note a previous EGD in 2016 also for hematemesis found severe exudative/erosive reflux esophagitis with inflamed esophageal mucosa as likely source of hematemesis.  This morning her hemoglobin declined from 11.5 to 9.4.  Her baseline over the past year is typically low 10 to low 11.  Today she states she has not had any further emesis since the ED arrival. Her BPs remain soft at this point. She has also noted some dark stools in the past couple days (last BM before coming to the ED.) Having some worsening GERD symptoms, had run out of Zofran at home. Denies hematochezia. Denies NSAIDs and ASA powders. No other GI  complaints.  Past Medical History:  Diagnosis Date  . Alcohol abuse   . Asthma   . Contracture of muscle of hand    right  . GERD (gastroesophageal reflux disease)   . HTN (hypertension)   . PUD (peptic ulcer disease)    remote  . Seizures ([place])    since stroke, no recent seizures  . Stroke [facility])    age 37, required brain surgery    Past Surgical History:  Procedure Laterality Date  . ABDOMINAL HYSTERECTOMY  2004   complete  . BRAIN SURGERY     age 39, stroke  . COLONOSCOPY    . COLONOSCOPY WITH PROPOFOL  10/03/2012   Dr. [name]: moderate diverticulosis, small internal hemorrhoids, TUBULAR ADENOMA. Next colonoscopy 2023 per Dr. [name].  . ESOPHAGOGASTRODUODENOSCOPY N/A 10/18/2014   Dr. Gala Romney during inpatient hospitalization: severe exudative/erosive reflux esophagitis as source of trivial upper GI bleed. No varices   . ESOPHAGOGASTRODUODENOSCOPY (EGD) WITH PROPOFOL  10/03/2012   Dr. [name]: stricture at Cotton junction s/p dilation, mild gastritis negative H.pylori   . ESOPHAGOGASTRODUODENOSCOPY (EGD) WITH PROPOFOL N/A 12/27/2017   Grade D esophagitis. Medium-sized hiatal hernia, normal duodenum  . right tib-fib fracture  2008  . SAVORY DILATION  10/03/2012   Procedure: SAVORY DILATION;  Surgeon: Danie Binder, MD;  Location: [facility];  Service: Endoscopy;  Laterality: N/A;  started at 1303,  dilated 12.8-16mm    Prior to Admission medications   Medication Sig Start Date End Date Taking? Authorizing Provider  albuterol (PROVENTIL HFA;VENTOLIN HFA) 108 (90 BASE) MCG/ACT inhaler Inhale 2 puffs  into the lungs every 6 (six) hours as needed for wheezing or shortness of breath.    Yes [provider]  dicyclomine (BENTYL) 20 MG tablet Take 1 tablet (20 mg total) by mouth every 6 (six) hours as needed for spasms. 10/23/19  Yes Virgel Manifold, MD  HYDROcodone-acetaminophen (HYCET) 7.5-325 mg/15 ml solution Take 10 mLs by mouth 4 (four) times daily as needed for moderate pain. 11/01/19   Yes Fields, Sandi L, MD  lidocaine (XYLOCAINE) 2 % solution 2 TSP  PO QAC AND HS TO PREVENT FOR CHEST PAIN WHILE EATING Patient taking differently: Use as directed 10 mLs in the mouth or throat See admin instructions. BEFORE MEALS AND AT BEDTIME TO PREVENT FOR CHEST PAIN WHILE EATING 11/01/19  Yes Fields, Sandi L, MD  lisinopril (ZESTRIL) 40 MG tablet Take 1 tablet (40 mg total) by mouth daily. 10/09/19  Yes Emokpae, Courage, MD  ondansetron (ZOFRAN) 4 MG tablet 1 PO 30 MINUTES PRIOR TO MEALS TID AND AT BEDTIME OR EVERY 6 HRS TO CONTROL NAUSEA/VOMITING Patient taking differently: Take 4 mg by mouth every 8 (eight) hours as needed for nausea or vomiting.  11/01/19  Yes Fields, Sandi L, MD  pantoprazole (PROTONIX) 40 MG tablet 1 PO 30 MINUTES PRIOR TO MEALS BID FOR 3 MOS THEN QD Patient taking differently: Take 40 mg by mouth 2 (two) times daily before a meal.  11/01/19  Yes Fields, Sandi L, MD  PHENobarbital (LUMINAL) 32.4 MG tablet Take 32.4 mg by mouth 2 (two) times daily. 07/13/19  Yes [provider]  phenytoin (DILANTIN) 100 MG ER capsule Take 100 mg by mouth 3 (three) times daily.   Yes [provider]  acetaminophen (TYLENOL) 325 MG tablet Take 325-650 mg by mouth every 6 (six) hours as needed.    [provider]  albuterol (PROVENTIL) (2.5 MG/3ML) 0.083% nebulizer solution Take 2.5 mg by nebulization every 6 (six) hours as needed for wheezing or shortness of breath.    [provider]  ENSURE PLUS (ENSURE PLUS) LIQD Take 237 mLs by mouth 3 (three) times daily between meals.    [provider]  folic acid (FOLVITE) 1 MG tablet Take 1 tablet (1 mg total) by mouth daily. 07/03/19   Carlis Stable, NP  ipratropium-albuterol (DUONEB) 0.5-2.5 (3) MG/3ML SOLN Take 3 mLs by nebulization every 6 (six) hours as needed (for shortness of breath/wheezing).     [provider]  linaclotide (LINZESS) 145 MCG CAPS capsule Take 145 mcg by mouth daily before  breakfast.    [provider]  Multiple Vitamin (MULTIVITAMIN WITH MINERALS) TABS tablet Take 1 tablet by mouth daily. 04/14/19   Johnson, Clanford L, MD  thiamine 100 MG tablet Take 1 tablet (100 mg total) by mouth daily. 04/14/19   Murlean Iba, MD    Current Facility-Administered Medications  Medication Dose Route Frequency Provider Last Rate Last Admin  . 0.9 % NaCl with KCl 20 mEq/ L  infusion   Intravenous Continuous Opyd, Ilene Qua, MD 100 mL/hr at 11/02/19 0049 New Bag at 11/02/19 0049  . albuterol (VENTOLIN HFA) 108 (90 Base) MCG/ACT inhaler 2 puff  2 puff Inhalation Q4H PRN Opyd, Ilene Qua, MD   2 puff at 11/02/19 0008  . folic acid (FOLVITE) tablet 1 mg  1 mg Oral Daily Opyd, Timothy S, MD      . LORazepam (ATIVAN) tablet 1-4 mg  1-4 mg Oral Q1H PRN Opyd, Ilene Qua, MD  Or  . LORazepam (ATIVAN) injection 1-4 mg  1-4 mg Intravenous Q1H PRN Opyd, Ilene Qua, MD      . multivitamin with minerals tablet 1 tablet  1 tablet Oral Daily Opyd, Ilene Qua, MD      . ondansetron (ZOFRAN) tablet 4 mg  4 mg Oral Q6H PRN Opyd, Ilene Qua, MD       Or  . ondansetron (ZOFRAN) injection 4 mg  4 mg Intravenous Q6H PRN Opyd, Ilene Qua, MD      . pantoprazole (PROTONIX) injection 40 mg  40 mg Intravenous Q12H Opyd, Ilene Qua, MD      . PHENobarbital (LUMINAL) tablet 32.4 mg  32.4 mg Oral BID Opyd, Ilene Qua, MD   32.4 mg at 11/02/19 0007  . phenytoin (DILANTIN) chewable tablet 100 mg  100 mg Oral TID Vianne Bulls, MD   100 mg at 11/02/19 0007  . sodium chloride 0.9 % bolus 1,000 mL  1,000 mL Intravenous Once Opyd, Ilene Qua, MD   Stopped at 11/02/19 0050  . thiamine tablet 100 mg  100 mg Oral Daily Opyd, Ilene Qua, MD       Or  . thiamine (B-1) injection 100 mg  100 mg Intravenous Daily Opyd, Ilene Qua, MD       Current Outpatient Medications  Medication Sig Dispense Refill  . albuterol (PROVENTIL HFA;VENTOLIN HFA) 108 (90 BASE) MCG/ACT inhaler Inhale 2 puffs into the lungs every  6 (six) hours as needed for wheezing or shortness of breath.     . dicyclomine (BENTYL) 20 MG tablet Take 1 tablet (20 mg total) by mouth every 6 (six) hours as needed for spasms. 12 tablet 0  . HYDROcodone-acetaminophen (HYCET) 7.5-325 mg/15 ml solution Take 10 mLs by mouth 4 (four) times daily as needed for moderate pain. 120 mL 0  . lidocaine (XYLOCAINE) 2 % solution 2 TSP  PO QAC AND HS TO PREVENT FOR CHEST PAIN WHILE EATING (Patient taking differently: Use as directed 10 mLs in the mouth or throat See admin instructions. BEFORE MEALS AND AT BEDTIME TO PREVENT FOR CHEST PAIN WHILE EATING) 500 mL 1  . lisinopril (ZESTRIL) 40 MG tablet Take 1 tablet (40 mg total) by mouth daily. 30 tablet 2  . ondansetron (ZOFRAN) 4 MG tablet 1 PO 30 MINUTES PRIOR TO MEALS TID AND AT BEDTIME OR EVERY 6 HRS TO CONTROL NAUSEA/VOMITING (Patient taking differently: Take 4 mg by mouth every 8 (eight) hours as needed for nausea or vomiting. ) 56 tablet 1  . pantoprazole (PROTONIX) 40 MG tablet 1 PO 30 MINUTES PRIOR TO MEALS BID FOR 3 MOS THEN QD (Patient taking differently: Take 40 mg by mouth 2 (two) times daily before a meal. ) 60 tablet 11  . PHENobarbital (LUMINAL) 32.4 MG tablet Take 32.4 mg by mouth 2 (two) times daily.    . phenytoin (DILANTIN) 100 MG ER capsule Take 100 mg by mouth 3 (three) times daily.    Marland Kitchen acetaminophen (TYLENOL) 325 MG tablet Take 325-650 mg by mouth every 6 (six) hours as needed.    Marland Kitchen albuterol (PROVENTIL) (2.5 MG/3ML) 0.083% nebulizer solution Take 2.5 mg by nebulization every 6 (six) hours as needed for wheezing or shortness of breath.    . ENSURE PLUS (ENSURE PLUS) LIQD Take 237 mLs by mouth 3 (three) times daily between meals.    . folic acid (FOLVITE) 1 MG tablet Take 1 tablet (1 mg total) by mouth daily. 30 tablet 0  . ipratropium-albuterol (  DUONEB) 0.5-2.5 (3) MG/3ML SOLN Take 3 mLs by nebulization every 6 (six) hours as needed (for shortness of breath/wheezing).     Marland Kitchen linaclotide  (LINZESS) 145 MCG CAPS capsule Take 145 mcg by mouth daily before breakfast.    . Multiple Vitamin (MULTIVITAMIN WITH MINERALS) TABS tablet Take 1 tablet by mouth daily. 30 tablet 1  . thiamine 100 MG tablet Take 1 tablet (100 mg total) by mouth daily. 30 tablet 1    Allergies as of 11/01/2019  . (No Known Allergies)    Family History  Problem Relation Age of Onset  . Liver disease Sister        etoh  . Colon cancer Neg Hx     Social History   Socioeconomic History  . Marital status: Single    Spouse name: Not on file  . Number of children: 3  . Years of education: Not on file  . Highest education level: Not on file  Occupational History  . Occupation: disability    Employer: UNEMPLOYED  Tobacco Use  . Smoking status: Former Smoker    Packs/day: 0.04    Years: 9.00    Pack years: 0.36    Types: Cigarettes    Quit date: 07/28/2014    Years since quitting: 5.2  . Smokeless tobacco: Never Used  Substance and Sexual Activity  . Alcohol use: Not Currently    Alcohol/week: 35.0 standard drinks    Types: 35 Cans of beer per week    Comment: 12 pack of beer twice per week; denied 11/01/19  . Drug use: No  . Sexual activity: Yes    Birth control/protection: Surgical  Other Topics Concern  . 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 HAS 3 KIDS: ONE IN [place], ONE IN [place], AND ONE OOT.   Social Determinants of Health   Financial Resource Strain:   . Difficulty of Paying Living Expenses: Not on file  Food Insecurity:   . Worried About Charity fundraiser in the Last Year: Not on file  . Ran Out of Food in the Last Year: Not on file  Transportation Needs:   . Lack of Transportation (Medical): Not on file  . Lack of Transportation (Non-Medical): Not on file  Physical Activity:   . Days of Exercise per Week: Not on file  . Minutes of Exercise per Session: Not on file  Stress:   . Feeling of Stress : Not on file  Social Connections:   .  Frequency of Communication with Friends and Family: Not on file  . Frequency of Social Gatherings with Friends and Family: Not on file  . Attends Religious Services: Not on file  . Active Member of Clubs or Organizations: Not on file  . Attends Archivist Meetings: Not on file  . Marital Status: Not on file  Intimate Partner Violence:   . Fear of Current or Ex-Partner: Not on file  . Emotionally Abused: Not on file  . Physically Abused: Not on file  . Sexually Abused: Not on file    Review of Systems: General: Negative for anorexia, weight loss, fever, chills, fatigue, weakness. ENT: Negative for hoarseness, difficulty swallowing CV: Negative for chest pain, angina, palpitations, peripheral edema.  Respiratory: Negative for dyspnea at rest, cough, sputum, wheezing.  GI: See history of present illness. Endo: Negative for unusual weight change.  Heme: Negative for bruising or bleeding. Allergy: Negative for rash or hives.  Physical  Exam: Vital signs in last 24 hours: Temp:  [96.6 F (35.9 C)-98.6 F (37 C)] 98.6 F (37 C) (01/14 2101) Pulse Rate:  [101-122] 104 (01/15 0606) Resp:  [14-22] 17 (01/15 0606) BP: (92-170)/(56-68) 144/58 (01/15 0606) SpO2:  [96 %-100 %] 98 % (01/15 0606) Weight:  [70.8 kg-75.1 kg] 70.8 kg (01/14 2052)   General:   Alert,  Well-developed, well-nourished, pleasant and cooperative in NAD Head:  Normocephalic and atraumatic. Eyes:  Sclera clear, no icterus. Conjunctiva pink. Ears:  Normal auditory acuity. Neck:  Supple; no masses or thyromegaly. Lungs:  Clear throughout to auscultation. No wheezes, crackles, or rhonchi. No acute distress. Heart:  Regular rate and rhythm; no murmurs, clicks, rubs,  or gallops. Abdomen:  Soft, and nondistended. Moderate generalized TTP, worse in the epigastrum/upper abdomen. No masses, hepatosplenomegaly or hernias noted. Normal bowel sounds, without guarding, and without rebound.   Rectal:  Deferred.   Msk:   Symmetrical without gross deformities. Extremities:  Without clubbing or edema. Neurologic:  Alert and  oriented x4;  grossly normal neurologically. Psych:  Alert and cooperative. Normal mood and affect.  Intake/Output from previous day: 01/14 0701 - 01/15 0700 In: 1100 [IV Piggyback:1100] Out: -  Intake/Output this shift: No intake/output data recorded.  Lab Results: Recent Labs    11/01/19 2113 11/02/19 0443  WBC 6.0 7.2  HGB 11.5* 9.4*  HCT 36.6 30.5*  PLT 214 141*   BMET Recent Labs    11/01/19 2113 11/02/19 0443  NA 138 140  K 2.9* 3.9  CL 95* 108  CO2 29 25  GLUCOSE 146* 120*  BUN 20 23  CREATININE 2.43* 1.55*  CALCIUM 9.5 8.5*   LFT Recent Labs    11/01/19 2113 11/02/19 0443  PROT 8.5* 6.6  ALBUMIN 3.9 2.9*  AST 40 26  ALT 25 20  ALKPHOS 89 71  BILITOT 1.0 0.7   PT/INR Recent Labs    11/01/19 2113  LABPROT 14.3  INR 1.1   Hepatitis Panel No results for input(s): HEPBSAG, HCVAB, HEPAIGM, HEPBIGM in the last 72 hours. C-Diff No results for input(s): CDIFFTOX in the last 72 hours.  Studies/Results: DG Chest Port 1 View  Result Date: 11/01/2019 CLINICAL DATA:  Vomiting blood EXAM: PORTABLE CHEST 1 VIEW COMPARISON:  01/22/2019 FINDINGS: Heart and mediastinal contours are within normal limits. No focal opacities or effusions. No acute bony abnormality. Degenerative changes in the shoulders. IMPRESSION: No active disease. Electronically Signed   By: Rolm Baptise M.D.   On: 11/01/2019 23:04    Impression: Very pleasant 64 year old female who was recently seen in our office for worsening GERD symptoms and worsening nausea after running out of her Zofran at home.  History of mild cirrhosis per CT abdomen 10/23/2019.  Also concerns for odynophagia and significantly sore throat.  In our office was recommended she have a strep swab and, if negative, CT of the neck to evaluate for any abnormalities.  Recent EGD in October 2020 with persistent LA grade D  esophagitis, no noted varices.  She presents with coffee-ground emesis and black stools.  Her vitals are essentially stable but her BP is little soft at this time.  She is being admitted for further evaluation and treatment.  Her labs today show somewhat of a drop in her hemoglobin from 11.5 yesterday to 9.4 today, although her baseline in the past month has been 9.5-10.5.  Normocytic and normochromic indices.  Platelet count stable/acceptable.  At this point I do not see an urgent  need for endoscopy.  We can monitor the patient and make plans as her clinical situation changes.  Plan: 1. Monitor for acute GI bleed 2. Follow hemoglobin closely 3. Strep throat swab 4. Consider CT of the neck if strep throat swab negative 5. Increase Protonix to twice daily 6. Zofran for nausea 7. Rocephin 1g daily x 7 days for likely cirrhosis and GI bleed 8. Can start clear liquid diet, NPO after midnight in case EGD needed 9. Supportive measures   Thank you for allowing [place] to participate in the care of [place], DNP, AGNP-C Adult & Gerontological Nurse Practitioner [facility]    LOS: 0 days     11/02/2019, 8:40 AM

## 2019-11-02 NOTE — ED Notes (Signed)
Patient is resting comfortably. 

## 2019-11-02 NOTE — Assessment & Plan Note (Addendum)
SYMPTOMS NOT IDEALLY CONTROLLED. WORSENING ABILITY TO AMBULATE DUE TO LOWER EXTREMITY WEAKNESS, AND PRIOR HISTORY OF STROKE.  ORDER POWER MOBILITY DEVICE, WHICH IS NECESSARY TO GET TO THE TOILET, KITCHEN, AND TO THE TO THE BEDROOM. PATIENT CANNOT USE A CANE/WALKER DUE TO HISTORY OF FALLS AND RLE OF 2/5, AND LLE 2/5. PT CANNOT USE A MANUAL WHEELCHAIR DUE TO CONTRACTURES OF R HAND AND PAIN LEVEL OF 9/10 IN LEFT HAND AND WRIST. PT WOULD NOT BE ABLE TO OPERATE THE TILLER OF A POV. THE PRESCRIBED EQUIPMENT WILL BE USED AT HOME AND TO GO TO HER DOCTOR'S APPT. PT WILL BE ABLE TO SAFELY OPERATE THE POWER MOBILITY DEVICE BOTH MENTALLY AND PHYSICALLY. THE PATIENT IS WILLING & MOTIVATED TO USE THE POWER MOBILITY DEVICE IN THE HOME.

## 2019-11-02 NOTE — Assessment & Plan Note (Addendum)
WORSENING ABILITY TO AMBULATE.  ORDER POWER MOBILITY DEVICE, WHICH IS NECESSARY TO GET TO THE TOILET, KITCHEN, AND TO THE TO THE BEDROOM. PATIENT CANNOT USE A CANE/WALKER DUE TO HISTORY OF FALLS AND RLE OF 2/5, AND LLE 2/5. PT CANNOT USE A MANUAL WHEELCHAIR DUE TO CONTRACTURES OF R HAND AND PAIN LEVEL OF 9/10 IN LEFT HAND AND WRIST. PT WOULD NOT BE ABLE TO OPERATE THE TILLER OF A POV. THE PRESCRIBED EQUIPMENT WILL BE USED AT HOME AND TO GO TO HER DOCTOR'S APPT. PT WILL BE ABLE TO SAFELY OPERATE THE POWER MOBILITY DEVICE BOTH MENTALLY AND PHYSICALLY. THE PATIENT IS WILLING & MOTIVATED TO USE THE POWER MOBILITY DEVICE IN THE HOME.

## 2019-11-02 NOTE — ED Notes (Signed)
Pt assisted to bathroom and assisted back to bed. Bed adjusted for her comfort.

## 2019-11-02 NOTE — Progress Notes (Signed)
PROGRESS NOTE  DENESHIA HICE [id number] DOB: Dec 20, 1955 DOA: 11/01/2019 PCP: [name], MD  HPI/Recap of past 58 hours: 64 year old female with past medical history of CVA, seizure disorder, ongoing alcohol abuse with secondary liver cirrhosis presented to the emergency room on 1/14 with complaints of abdominal pain and hematemesis and found to be in acute kidney injury and tachycardic.  Initial hemoglobin at 11.5.  Admit to the hospitalist service and GI consulted.  By following morning, hemoglobin had dropped down to 9.4 and creatinine down to 1.55.  Patient has had no further episodes of hematemesis.  This morning, she feels a little better, complaining still of some sore throat, but no abdominal pain.  Assessment/Plan: Principal Problem:   AKI (acute kidney injury) ([place]): Likely secondary to blood loss.  Continue fluids.  Monitor closely. Active Problems:    Hypokalemia: Secondary to GI losses.  Replace as needed    Seizure disorder [facility]): Continue Dilantin and phenobarbital.    Coffee ground emesis with acute blood loss anemia: Monitor closely.  Suspect some of this was from recent blood loss and the rest was from hemoconcentration.  Hemoglobin currently 9.4 and suspect she is slightly lower than that given elevated creatinine.  GI to see.  Transfuse for hemoglobin less than 7    Cirrhosis of liver without ascites (HCC)/ongoing alcohol abuse: Monitor with CIWA.  Continue PPI.  Supplemental vitamins.    History of stroke    Overweight (BMI 25.0-29.9): Meets criteria for BMI greater than 25.  Code Status: Full code  Family Communication: Declined for me to call anyone  Disposition Plan: Home once confirmed bleeding is stabilized, cleared by GI   Consultants:  Gastroenterology  Procedures:  None  Antimicrobials:  None  DVT prophylaxis: SCDs   Objective: Vitals:   11/02/19 1030 11/02/19 1101  BP: 110/65 110/65  Pulse: 93 93  Resp: 17   Temp:     SpO2: 99%     Intake/Output Summary (Last 24 hours) at 11/02/2019 1220 Last data filed at 11/02/2019 0256 Gross per 24 hour  Intake 1100 ml  Output --  Net 1100 ml   Filed Weights   11/01/19 2052  Weight: 70.8 kg   Body mass index is 26.78 kg/m.  Exam:   General: Alert and oriented x3, no acute distress, fatigued  HEENT: Normocephalic and atraumatic, mucous membranes slightly dry:  Neck: Supple, no JVD  Cardiovascular: Regular rate and rhythm, S1-S2  Respiratory: Clear to auscultation bilaterally  Abdomen: Soft, nontender, nondistended, normal active bowel sounds  Musculoskeletal: No clubbing or cyanosis or edema  Skin: No skin breaks, tears or lesions  Psychiatry: Appropriate, no evidence of psychoses   Data Reviewed: CBC: Recent Labs  Lab 11/01/19 2113 11/02/19 0443  WBC 6.0 7.2  HGB 11.5* 9.4*  HCT 36.6 30.5*  MCV 92.2 94.7  PLT 214 [id number]*   Basic Metabolic Panel: Recent Labs  Lab 11/01/19 2113 11/02/19 0443  NA 138 140  K 2.9* 3.9  CL 95* 108  CO2 29 25  GLUCOSE 146* 120*  BUN 20 23  CREATININE 2.43* 1.55*  CALCIUM 9.5 8.5*   GFR: Estimated Creatinine Clearance: 36.3 mL/min (A) (by C-G formula based on SCr of 1.55 mg/dL (H)). Liver Function Tests: Recent Labs  Lab 11/01/19 2113 11/02/19 0443  AST 40 26  ALT 25 20  ALKPHOS 89 71  BILITOT 1.0 0.7  PROT 8.5* 6.6  ALBUMIN 3.9 2.9*   Recent Labs  Lab 11/01/19 2113  LIPASE 26  No results for input(s): AMMONIA in the last 168 hours. Coagulation Profile: Recent Labs  Lab 11/01/19 2113  INR 1.1   Cardiac Enzymes: No results for input(s): CKTOTAL, CKMB, CKMBINDEX, TROPONINI in the last 168 hours. BNP (last 3 results) No results for input(s): PROBNP in the last 8760 hours. HbA1C: No results for input(s): HGBA1C in the last 72 hours. CBG: No results for input(s): GLUCAP in the last 168 hours. Lipid Profile: No results for input(s): CHOL, HDL, LDLCALC, TRIG, CHOLHDL, LDLDIRECT  in the last 72 hours. Thyroid Function Tests: No results for input(s): TSH, T4TOTAL, FREET4, T3FREE, THYROIDAB in the last 72 hours. Anemia Panel: Recent Labs    11/01/19 2113  VITAMINB12 868  FOLATE 38.8  FERRITIN 19  TIBC 431  IRON 64  RETICCTPCT 1.5   Urine analysis:    Component Value Date/Time   COLORURINE YELLOW 11/02/2019 0550   APPEARANCEUR HAZY (A) 11/02/2019 0550   LABSPEC 1.014 11/02/2019 0550   PHURINE 5.0 11/02/2019 0550   GLUCOSEU NEGATIVE 11/02/2019 0550   HGBUR NEGATIVE 11/02/2019 0550   BILIRUBINUR NEGATIVE 11/02/2019 0550   KETONESUR NEGATIVE 11/02/2019 0550   PROTEINUR NEGATIVE 11/02/2019 0550   UROBILINOGEN 0.2 12/26/2014 1927   NITRITE NEGATIVE 11/02/2019 0550   LEUKOCYTESUR NEGATIVE 11/02/2019 0550   Sepsis Labs: @LABRCNTIP (procalcitonin:4,lacticidven:4)  ) Recent Results (from the past 240 hour(s))  Respiratory Panel by RT PCR (Flu A&B, Covid) - Nasopharyngeal Swab     Status: None   Collection Time: 11/01/19 11:12 PM   Specimen: Nasopharyngeal Swab  Result Value Ref Range Status   SARS Coronavirus 2 by RT PCR NEGATIVE NEGATIVE Final    Comment: (NOTE) SARS-CoV-2 target nucleic acids are NOT DETECTED. The SARS-CoV-2 RNA is generally detectable in upper respiratoy specimens during the acute phase of infection. The lowest concentration of SARS-CoV-2 viral copies this assay can detect is 131 copies/mL. A negative result does not preclude SARS-Cov-2 infection and should not be used as the sole basis for treatment or other patient management decisions. A negative result may occur with  improper specimen collection/handling, submission of specimen other than nasopharyngeal swab, presence of viral mutation(s) within the areas targeted by this assay, and inadequate number of viral copies (<131 copies/mL). A negative result must be combined with clinical observations, patient history, and epidemiological information. The expected result is  Negative. Fact Sheet for Patients:  PinkCheek.be Fact Sheet for Healthcare Providers:  GravelBags.it This test is not yet ap proved or cleared by the [place] FDA and  has been authorized for detection and/or diagnosis of SARS-CoV-2 by FDA under an Emergency Use Authorization (EUA). This EUA will remain  in effect (meaning this test can be used) for the duration of the COVID-19 declaration under Section 564(b)(1) of the Act, 21 U.S.C. section 360bbb-3(b)(1), unless the authorization is terminated or revoked sooner.    Influenza A by PCR NEGATIVE NEGATIVE Final   Influenza B by PCR NEGATIVE NEGATIVE Final    Comment: (NOTE) The Xpert Xpress SARS-CoV-2/FLU/RSV assay is intended as an aid in  the diagnosis of influenza from Nasopharyngeal swab specimens and  should not be used as a sole basis for treatment. Nasal washings and  aspirates are unacceptable for Xpert Xpress SARS-CoV-2/FLU/RSV  testing. Fact Sheet for Patients: PinkCheek.be Fact Sheet for Healthcare Providers: GravelBags.it This test is not yet approved or cleared by the [place] FDA and  has been authorized for detection and/or diagnosis of SARS-CoV-2 by  FDA under an Emergency Use Authorization (EUA). This  EUA will remain  in effect (meaning this test can be used) for the duration of the  Covid-19 declaration under Section 564(b)(1) of the Act, 21  U.S.C. section 360bbb-3(b)(1), unless the authorization is  terminated or revoked. Performed at [facility], [address], [place], [place] [address]   Group A Strep by PCR     Status: None   Collection Time: 11/02/19 12:10 AM   Specimen: Throat; Sterile Swab  Result Value Ref Range Status   Group A Strep by PCR NOT DETECTED NOT DETECTED Final    Comment: Performed at [facility], [address], [place], [place] [address]      Studies: DG  Chest [facility] 1 View  Result Date: 11/01/2019 CLINICAL DATA:  Vomiting blood EXAM: PORTABLE CHEST 1 VIEW COMPARISON:  01/22/2019 FINDINGS: Heart and mediastinal contours are within normal limits. No focal opacities or effusions. No acute bony abnormality. Degenerative changes in the shoulders. IMPRESSION: No active disease. Electronically Signed   By: Rolm Baptise M.D.   On: 11/01/2019 23:04    Scheduled Meds: . folic acid  1 mg Oral Daily  . multivitamin with minerals  1 tablet Oral Daily  . pantoprazole (PROTONIX) IV  40 mg Intravenous Q12H  . phenobarbital  32.4 mg Oral BID  . phenytoin  100 mg Oral TID  . thiamine  100 mg Oral Daily   Or  . thiamine  100 mg Intravenous Daily    Continuous Infusions: . sodium chloride Stopped (11/02/19 0050)     LOS: 0 days     Annita Brod, MD [facility]  To reach me or the doctor on call, go to: [email or web address] Password [facility]  11/02/2019, 12:20 PM

## 2019-11-02 NOTE — Progress Notes (Signed)
Cc'ed to pcp °

## 2019-11-02 NOTE — Assessment & Plan Note (Signed)
WORSENING ABILITY TO AMBULATE.  ORDER POWER MOBILITY DEVICE.

## 2019-11-03 LAB — HEMOGLOBIN AND HEMATOCRIT, BLOOD
HCT: 29.4 % — ABNORMAL LOW (ref 36.0–46.0)
Hemoglobin: 9.2 g/dL — ABNORMAL LOW (ref 12.0–15.0)

## 2019-11-03 LAB — MRSA PCR SCREENING: MRSA by PCR: NEGATIVE

## 2019-11-03 MED ORDER — SUCRALFATE 1 GM/10ML PO SUSP
1.0000 g | Freq: Three times a day (TID) | ORAL | Status: DC
  Administered 2019-11-03 – 2019-11-05 (×9): 1 g via ORAL
  Filled 2019-11-03 (×9): qty 10

## 2019-11-03 MED ORDER — SUCRALFATE 1 GM/10ML PO SUSP: 1.0000 g | Freq: Three times a day (TID) | ORAL | Status: DC

## 2019-11-03 NOTE — Progress Notes (Signed)
Patient states throat discomfort improved.  She is hungry and wants to eat.  Diet advanced to clear liquids earlier today.  No further obvious GI bleeding.  Strep PCR negative.    Vital signs in last 24 hours: Temp:  [98.2 F (36.8 C)-99.4 F (37.4 C)] 98.2 F (36.8 C) (01/16 0832) Pulse Rate:  [39-100] 86 (01/16 1000) Resp:  [12-21] 17 (01/16 1000) BP: (101-156)/(50-79) 141/67 (01/16 1000) SpO2:  [85 %-100 %] 100 % (01/16 1000) Weight:  [75.2 kg-76.4 kg] 76.4 kg (01/16 0500) Last BM Date: (patient is unsure. states sometime in the last week) General:   Alert,   cooperative in NAD Throat: Negative for erythema/exudate.  No cervical adenopathy; neck is supple Abdomen:  Soft, nontender and nondistended.  Normal bowel sounds, without guarding, and without rebound.  No mass or organomegaly. Extremities:  Without clubbing or edema.    Intake/Output from previous day: 01/15 0701 - 01/16 0700 In: -  Out: 850 [Urine:850] Intake/Output this shift: Total I/O In: -  Out: 400 [Urine:400]  Lab Results: Recent Labs    11/01/19 2113 11/01/19 2113 11/02/19 0443 11/02/19 0443 11/02/19 1450 11/02/19 2246 11/03/19 0601  WBC 6.0  --  7.2  --   --   --   --   HGB 11.5*   < > 9.4*   < > 10.3* 10.0* 9.2*  HCT 36.6   < > 30.5*   < > 33.8* 32.6* 29.4*  PLT 214  --  141*  --   --   --   --    < > = values in this interval not displayed.   BMET Recent Labs    11/01/19 2113 11/02/19 0443  NA 138 140  K 2.9* 3.9  CL 95* 108  CO2 29 25  GLUCOSE 146* 120*  BUN 20 23  CREATININE 2.43* 1.55*  CALCIUM 9.5 8.5*   LFT Recent Labs    11/02/19 0443  PROT 6.6  ALBUMIN 2.9*  AST 26  ALT 20  ALKPHOS 71  BILITOT 0.7   PT/INR Recent Labs    11/01/19 2113  LABPROT 14.3  INR 1.1   Hepatitis Panel No results for input(s): HEPBSAG, HCVAB, HEPAIGM, HEPBIGM in the last 72 hours. C-Diff No results for input(s): CDIFFTOX in the last 72 hours.  Studies/Results: DG Chest Port 1  View  Result Date: 11/01/2019 CLINICAL DATA:  Vomiting blood EXAM: PORTABLE CHEST 1 VIEW COMPARISON:  01/22/2019 FINDINGS: Heart and mediastinal contours are within normal limits. No focal opacities or effusions. No acute bony abnormality. Degenerative changes in the shoulders. IMPRESSION: No active disease. Electronically Signed   By: Rolm Baptise M.D.   On: 11/01/2019 23:04     Impression:   64 year old lady with a history of a severe, complicated GERD (exudative/ ulcerative reflux esophagitis admitted with coffee ground emesis, odynophagia dysphagia.  No further emesis.  Hemoglobin down but stable.  Odynophagia dysphagia improved.  She has had severe reflux esophagitis on multiple serial EGDs in the past. Strep screen negative.  Recommendations: We will advance to full liquid diet.  Continue twice daily PPI.  Add Carafate suspension 1 g 4 times daily.  I think we can hold off on EGD/neck CT so long as she progresses.  Complete 7-day course of antibiotics due to GI bleeding in a cirrhotic. We will continue to follow with you. She needs to have Zofran available on a as needed basis for nausea.

## 2019-11-03 NOTE — Progress Notes (Signed)
PROGRESS NOTE  [name] [id number] DOB: [birth year] DOA: 11/01/2019 PCP: [name], MD  HPI/Recap of past 85 hours: 64 year old female with past medical history of CVA, seizure disorder, ongoing alcohol abuse with secondary liver cirrhosis presented to the emergency room on 1/14 with complaints of abdominal pain and hematemesis and found to be in acute kidney injury and tachycardic.  Initial hemoglobin at 11.5.  Admit to the hospitalist service and GI consulted.  By following morning, hemoglobin had dropped down to 9.4 and creatinine down to 1.55.  Patient has had no further episodes of hematemesis.  This morning, she feels a little better, complaining still of some sore throat, but no abdominal pain.  11/03/2019: Patient seen.  Discussed with the patient's nurse extensively.  GI and PT is appreciated.  Patient's diet has been advanced to full liquid diet.  No further GI bleeding noted.  H/H has not shown any significant drop.  Some drop noted in the H/H may be secondary to volume repletion.  Continue to monitor closely.  Hopefully, if patient continues to remain stable, patient may be transferred out of ICU tomorrow.  Assessment/Plan: Principal Problem:    AKI (acute kidney injury) ([place]):  Likely secondary to blood loss.  Continue fluids.  Monitor closely. 11/03/2019: AKI is improving.  Repeat renal function and electrolytes tomorrow.  Hypokalemia:  Secondary to GI losses.   Replace as needed 11/03/2019: Hypokalemia has resolved.  Repeat levels in the morning.  Seizure disorder [facility]):  Continue Dilantin and phenobarbital.  Coffee ground emesis with acute blood loss anemia:  Monitor closely.  Suspect some of this was from recent blood loss and the rest was from hemoconcentration.  Hemoglobin currently 9.4 and suspect she is slightly lower than that given elevated creatinine.  GI to see.  Transfuse for hemoglobin less than 7 11/03/2019: No further bleeding noted.  Continue to  monitor his diet.  Cirrhosis of liver without ascites (HCC)/ongoing alcohol abuse:  Monitor with CIWA.  Continue PPI.  Supplemental vitamins. 11/03/2019: No alcohol withdrawal was noted.  History of stroke    Overweight (BMI 25.0-29.9): Meets criteria for BMI greater than 25.  Code Status: Full code  Family Communication:   Disposition Plan: Home once confirmed bleeding is stabilized, cleared by GI   Consultants:  Gastroenterology  Procedures:  None  Antimicrobials:  None  DVT prophylaxis: SCDs   Objective: Vitals:   11/03/19 1300 11/03/19 1400  BP: (!) 149/69 130/66  Pulse: 92 93  Resp: 17 (!) 21  Temp:    SpO2: 99% 100%    Intake/Output Summary (Last 24 hours) at 11/03/2019 1444 Last data filed at 11/03/2019 1300 Gross per 24 hour  Intake 600 ml  Output 1250 ml  Net -650 ml   Filed Weights   11/01/19 2052 11/02/19 2138 11/03/19 0500  Weight: 70.8 kg 75.2 kg 76.4 kg   Body mass index is 28.91 kg/m.  Exam:   General: Alert and oriented x3, no acute distress, fatigued  HEENT: Normocephalic and atraumatic, Pallor.  Neck: Supple, no JVD  Cardiovascular: Regular rate and rhythm, S1-S2  Respiratory: Clear to auscultation bilaterally  Abdomen: Soft, nontender, nondistended, normal active bowel sounds  Musculoskeletal: No clubbing or cyanosis or edema  Data Reviewed: CBC: Recent Labs  Lab 11/01/19 2113 11/02/19 0443 11/02/19 1450 11/02/19 2246 11/03/19 0601  WBC 6.0 7.2  --   --   --   HGB 11.5* 9.4* 10.3* 10.0* 9.2*  HCT 36.6 30.5* 33.8* 32.6* 29.4*  MCV  92.2 94.7  --   --   --   PLT 214 141*  --   --   --    Basic Metabolic Panel: Recent Labs  Lab 11/01/19 2113 11/02/19 0443  NA 138 140  K 2.9* 3.9  CL 95* 108  CO2 29 25  GLUCOSE 146* 120*  BUN 20 23  CREATININE 2.43* 1.55*  CALCIUM 9.5 8.5*   GFR: Estimated Creatinine Clearance: 37.7 mL/min (A) (by C-G formula based on SCr of 1.55 mg/dL (H)). Liver Function  Tests: Recent Labs  Lab 11/01/19 2113 11/02/19 0443  AST 40 26  ALT 25 20  ALKPHOS 89 71  BILITOT 1.0 0.7  PROT 8.5* 6.6  ALBUMIN 3.9 2.9*   Recent Labs  Lab 11/01/19 2113  LIPASE 26   No results for input(s): AMMONIA in the last 168 hours. Coagulation Profile: Recent Labs  Lab 11/01/19 2113  INR 1.1   Cardiac Enzymes: No results for input(s): CKTOTAL, CKMB, CKMBINDEX, TROPONINI in the last 168 hours. BNP (last 3 results) No results for input(s): PROBNP in the last 8760 hours. HbA1C: No results for input(s): HGBA1C in the last 72 hours. CBG: No results for input(s): GLUCAP in the last 168 hours. Lipid Profile: No results for input(s): CHOL, HDL, LDLCALC, TRIG, CHOLHDL, LDLDIRECT in the last 72 hours. Thyroid Function Tests: No results for input(s): TSH, T4TOTAL, FREET4, T3FREE, THYROIDAB in the last 72 hours. Anemia Panel: Recent Labs    11/01/19 2113  VITAMINB12 868  FOLATE 38.8  FERRITIN 19  TIBC 431  IRON 64  RETICCTPCT 1.5   Urine analysis:    Component Value Date/Time   COLORURINE YELLOW 11/02/2019 0550   APPEARANCEUR HAZY (A) 11/02/2019 0550   LABSPEC 1.014 11/02/2019 0550   PHURINE 5.0 11/02/2019 0550   GLUCOSEU NEGATIVE 11/02/2019 0550   HGBUR NEGATIVE 11/02/2019 0550   BILIRUBINUR NEGATIVE 11/02/2019 0550   KETONESUR NEGATIVE 11/02/2019 0550   PROTEINUR NEGATIVE 11/02/2019 0550   UROBILINOGEN 0.2 12/26/2014 1927   NITRITE NEGATIVE 11/02/2019 0550   LEUKOCYTESUR NEGATIVE 11/02/2019 0550   Sepsis Labs: @LABRCNTIP (procalcitonin:4,lacticidven:4)  ) Recent Results (from the past 240 hour(s))  Respiratory Panel by RT PCR (Flu A&B, Covid) - Nasopharyngeal Swab     Status: None   Collection Time: 11/01/19 11:12 PM   Specimen: Nasopharyngeal Swab  Result Value Ref Range Status   SARS Coronavirus 2 by RT PCR NEGATIVE NEGATIVE Final    Comment: (NOTE) SARS-CoV-2 target nucleic acids are NOT DETECTED. The SARS-CoV-2 RNA is generally detectable  in upper respiratoy specimens during the acute phase of infection. The lowest concentration of SARS-CoV-2 viral copies this assay can detect is 131 copies/mL. A negative result does not preclude SARS-Cov-2 infection and should not be used as the sole basis for treatment or other patient management decisions. A negative result may occur with  improper specimen collection/handling, submission of specimen other than nasopharyngeal swab, presence of viral mutation(s) within the areas targeted by this assay, and inadequate number of viral copies (<131 copies/mL). A negative result must be combined with clinical observations, patient history, and epidemiological information. The expected result is Negative. Fact Sheet for Patients:  PinkCheek.be Fact Sheet for Healthcare Providers:  GravelBags.it This test is not yet ap proved or cleared by the [place] FDA and  has been authorized for detection and/or diagnosis of SARS-CoV-2 by FDA under an Emergency Use Authorization (EUA). This EUA will remain  in effect (meaning this test can be used) for the duration  of the COVID-19 declaration under Section 564(b)(1) of the Act, 21 U.S.C. section 360bbb-3(b)(1), unless the authorization is terminated or revoked sooner.    Influenza A by PCR NEGATIVE NEGATIVE Final   Influenza B by PCR NEGATIVE NEGATIVE Final    Comment: (NOTE) The Xpert Xpress SARS-CoV-2/FLU/RSV assay is intended as an aid in  the diagnosis of influenza from Nasopharyngeal swab specimens and  should not be used as a sole basis for treatment. Nasal washings and  aspirates are unacceptable for Xpert Xpress SARS-CoV-2/FLU/RSV  testing. Fact Sheet for Patients: PinkCheek.be Fact Sheet for Healthcare Providers: GravelBags.it This test is not yet approved or cleared by the [place] FDA and  has been authorized for  detection and/or diagnosis of SARS-CoV-2 by  FDA under an Emergency Use Authorization (EUA). This EUA will remain  in effect (meaning this test can be used) for the duration of the  Covid-19 declaration under Section 564(b)(1) of the Act, 21  U.S.C. section 360bbb-3(b)(1), unless the authorization is  terminated or revoked. Performed at [facility], [address] [place], [place] [address]   Group A Strep by PCR     Status: None   Collection Time: 11/02/19 12:10 AM   Specimen: Throat; Sterile Swab  Result Value Ref Range Status   Group A Strep by PCR NOT DETECTED NOT DETECTED Final    Comment: Performed at [facility], [address] [place], [place] [address]      Studies: No results found.  Scheduled Meds: . Chlorhexidine Gluconate Cloth  6 each Topical Daily  . folic acid  1 mg Oral Daily  . multivitamin with minerals  1 tablet Oral Daily  . pantoprazole (PROTONIX) IV  40 mg Intravenous Q12H  . phenobarbital  32.4 mg Oral BID  . phenytoin  100 mg Oral TID  . sucralfate  1 g Oral TID WC & HS  . thiamine  100 mg Oral Daily   Or  . thiamine  100 mg Intravenous Daily    Continuous Infusions: . sodium chloride 100 mL/hr at 11/03/19 1200  . cefTRIAXone (ROCEPHIN)  IV 2 g (11/03/19 1403)  . sodium chloride Stopped (11/02/19 0050)     LOS: 1 day     Bonnell Public, MD [facility]  To reach me or the doctor on call, go to: [email or web address] Password [facility]  11/03/2019, 2:44 PM

## 2019-11-04 LAB — RENAL FUNCTION PANEL
Albumin: 2.7 g/dL — ABNORMAL LOW (ref 3.5–5.0)
Anion gap: 7 (ref 5–15)
BUN: <5 mg/dL — ABNORMAL LOW (ref 8–23)
CO2: 24 mmol/L (ref 22–32)
Calcium: 8.3 mg/dL — ABNORMAL LOW (ref 8.9–10.3)
Chloride: 111 mmol/L (ref 98–111)
Creatinine, Ser: 0.64 mg/dL (ref 0.44–1.00)
GFR calc Af Amer: >60 mL/min (ref >60)
GFR calc non Af Amer: >60 mL/min (ref >60)
Glucose, Bld: 104 mg/dL — ABNORMAL HIGH (ref 70–99)
Phosphorus: 2.9 mg/dL (ref 2.5–4.6)
Potassium: 3.6 mmol/L (ref 3.5–5.1)
Sodium: 142 mmol/L (ref 135–145)

## 2019-11-04 LAB — CBC
HCT: 29 % — ABNORMAL LOW (ref 36.0–46.0)
Hemoglobin: 9 g/dL — ABNORMAL LOW (ref 12.0–15.0)
MCH: 28.8 pg (ref 26.0–34.0)
MCHC: 31 g/dL (ref 30.0–36.0)
MCV: 92.7 fL (ref 80.0–100.0)
Platelets: 119 10*3/uL — ABNORMAL LOW (ref 150–400)
RBC: 3.13 MIL/uL — ABNORMAL LOW (ref 3.87–5.11)
RDW: 13.1 % (ref 11.5–15.5)
WBC: 3.7 10*3/uL — ABNORMAL LOW (ref 4.0–10.5)
nRBC: 0 % (ref 0.0–0.2)

## 2019-11-04 LAB — MAGNESIUM: Magnesium: 1.4 mg/dL — ABNORMAL LOW (ref 1.7–2.4)

## 2019-11-04 MED ORDER — POTASSIUM CHLORIDE CRYS ER 20 MEQ PO TBCR
40.0000 meq | EXTENDED_RELEASE_TABLET | Freq: Once | ORAL | Status: AC
  Administered 2019-11-04: 18:00:00 40 meq via ORAL
  Filled 2019-11-04: qty 2

## 2019-11-04 MED ORDER — MAGNESIUM SULFATE 50 % IJ SOLN: 3.0000 g | Freq: Once | INTRAMUSCULAR | Status: DC

## 2019-11-04 MED ORDER — MAGNESIUM SULFATE 2 GM/50ML IV SOLN
2.0000 g | Freq: Once | INTRAVENOUS | Status: AC
  Administered 2019-11-04: 18:00:00 2 g via INTRAVENOUS
  Filled 2019-11-04: qty 50

## 2019-11-04 NOTE — Progress Notes (Signed)
PROGRESS NOTE  [name] [id number] DOB: 1956-08-03 DOA: 11/01/2019 PCP: [name], MD  HPI/Recap of past 91 hours: 64 year old female with past medical history of CVA, seizure disorder, ongoing alcohol abuse with secondary liver cirrhosis presented to the emergency room on 1/14 with complaints of abdominal pain and hematemesis and found to be in acute kidney injury and tachycardic.  Initial hemoglobin at 11.5.  Admit to the hospitalist service and GI consulted.  By following morning, hemoglobin had dropped down to 9.4 and creatinine down to 1.55.  Patient has had no further episodes of hematemesis.  This morning, she feels a little better, complaining still of some sore throat, but no abdominal pain.  11/03/2019: Patient seen.  Discussed with the patient's nurse extensively.  GI and PT is appreciated.  Patient's diet has been advanced to full liquid diet.  No further GI bleeding noted.  H/H has not shown any significant drop.  Some drop noted in the H/H may be secondary to volume repletion.  Continue to monitor closely.  Hopefully, if patient continues to remain stable, patient may be transferred out of ICU tomorrow.  11/04/2019: Patient seen.  No further bleeding reported.  Patient was seen alongside GI consultant.  Will advance diet to heart healthy diet, as directed by the GI team.  Acute kidney injury has resolved.  Magnesium is 1.4, will replete.  We will transfer patient to telemetry floor.  Further management depend on hospital course.  Assessment/Plan: AKI (acute kidney injury):  Likely secondary to blood loss.  Continue fluids.  Monitor closely. 11/03/2019: AKI is improving.  Repeat renal function and electrolytes tomorrow. 11/04/2019: Resolved.  Hypokalemia:  Secondary to GI losses.   Replace as needed 11/03/2019: Hypokalemia has resolved.  Repeat levels in the morning. 11/04/2019: Potassium is 3.6 today.  Will order KCl 40 M EQ x1 dose.  Hypomagnesemia: Magnesium is  1.4 today. We will give 3 g of magnesium IV.  Seizure disorder [facility]):  Continue Dilantin and phenobarbital.  Coffee ground emesis with acute blood loss anemia:  -No further bleeding reported. -H&H is stable. -No further work-up for now as per GI team.  Cirrhosis of liver without ascites/ongoing alcohol abuse:  Monitor.  Patient is on CIWA protocol.  Continue PPI.  No alcohol withdrawal was noted.  History of stroke  Overweight (BMI 25.0-29.9):  Meets criteria for BMI greater than 25.  Code Status: Full code  Family Communication:   Disposition Plan: Likely home eventually   Consultants:  Gastroenterology  Procedures:  None  Antimicrobials:  None  DVT prophylaxis: SCDs   Objective: Vitals:   11/04/19 1000 11/04/19 1054  BP: 124/64   Pulse: 88 86  Resp: 18 18  Temp:  98.7 F (37.1 C)  SpO2: 99% 99%    Intake/Output Summary (Last 24 hours) at 11/04/2019 1155 Last data filed at 11/04/2019 1150 Gross per 24 hour  Intake 1100 ml  Output 3300 ml  Net -2200 ml   Filed Weights   11/02/19 2138 11/03/19 0500 11/04/19 0600  Weight: 75.2 kg 76.4 kg 76.6 kg   Body mass index is 28.99 kg/m.  Exam:   General: Alert and oriented x3, no acute distress, fatigued  HEENT: Normocephalic and atraumatic, Pallor.  Neck: Supple, no JVD  Cardiovascular: Regular rate and rhythm, S1-S2  Respiratory: Clear to auscultation bilaterally  Abdomen: Soft, nontender, nondistended, normal active bowel sounds  Musculoskeletal: No clubbing or cyanosis or edema  Data Reviewed: CBC: Recent Labs  Lab 11/01/19 2113 11/01/19  2113 11/02/19 0443 11/02/19 1450 11/02/19 2246 11/03/19 0601 11/04/19 0632  WBC 6.0  --  7.2  --   --   --  3.7*  HGB 11.5*   < > 9.4* 10.3* 10.0* 9.2* 9.0*  HCT 36.6   < > 30.5* 33.8* 32.6* 29.4* 29.0*  MCV 92.2  --  94.7  --   --   --  92.7  PLT 214  --  141*  --   --   --  119*   < > = values in this interval not displayed.   Basic  Metabolic Panel: Recent Labs  Lab 11/01/19 2113 11/02/19 0443 11/04/19 0632  NA 138 140 142  K 2.9* 3.9 3.6  CL 95* 108 111  CO2 29 25 24   GLUCOSE 146* 120* 104*  BUN 20 23 <5*  CREATININE 2.43* 1.55* 0.64  CALCIUM 9.5 8.5* 8.3*  MG  --   --  1.4*  PHOS  --   --  2.9   GFR: Estimated Creatinine Clearance: 73.1 mL/min (by C-G formula based on SCr of 0.64 mg/dL). Liver Function Tests: Recent Labs  Lab 11/01/19 2113 11/02/19 0443 11/04/19 0632  AST 40 26  --   ALT 25 20  --   ALKPHOS 89 71  --   BILITOT 1.0 0.7  --   PROT 8.5* 6.6  --   ALBUMIN 3.9 2.9* 2.7*   Recent Labs  Lab 11/01/19 2113  LIPASE 26   No results for input(s): AMMONIA in the last 168 hours. Coagulation Profile: Recent Labs  Lab 11/01/19 2113  INR 1.1   Cardiac Enzymes: No results for input(s): CKTOTAL, CKMB, CKMBINDEX, TROPONINI in the last 168 hours. BNP (last 3 results) No results for input(s): PROBNP in the last 8760 hours. HbA1C: No results for input(s): HGBA1C in the last 72 hours. CBG: No results for input(s): GLUCAP in the last 168 hours. Lipid Profile: No results for input(s): CHOL, HDL, LDLCALC, TRIG, CHOLHDL, LDLDIRECT in the last 72 hours. Thyroid Function Tests: No results for input(s): TSH, T4TOTAL, FREET4, T3FREE, THYROIDAB in the last 72 hours. Anemia Panel: Recent Labs    11/01/19 2113  VITAMINB12 868  FOLATE 38.8  FERRITIN 19  TIBC 431  IRON 64  RETICCTPCT 1.5   Urine analysis:    Component Value Date/Time   COLORURINE YELLOW 11/02/2019 0550   APPEARANCEUR HAZY (A) 11/02/2019 0550   LABSPEC 1.014 11/02/2019 0550   PHURINE 5.0 11/02/2019 0550   GLUCOSEU NEGATIVE 11/02/2019 0550   HGBUR NEGATIVE 11/02/2019 0550   BILIRUBINUR NEGATIVE 11/02/2019 0550   KETONESUR NEGATIVE 11/02/2019 0550   PROTEINUR NEGATIVE 11/02/2019 0550   UROBILINOGEN 0.2 12/26/2014 1927   NITRITE NEGATIVE 11/02/2019 0550   LEUKOCYTESUR NEGATIVE 11/02/2019 0550   Sepsis  Labs: @LABRCNTIP (procalcitonin:4,lacticidven:4)  ) Recent Results (from the past 240 hour(s))  Respiratory Panel by RT PCR (Flu A&B, Covid) - Nasopharyngeal Swab     Status: None   Collection Time: 11/01/19 11:12 PM   Specimen: Nasopharyngeal Swab  Result Value Ref Range Status   SARS Coronavirus 2 by RT PCR NEGATIVE NEGATIVE Final    Comment: (NOTE) SARS-CoV-2 target nucleic acids are NOT DETECTED. The SARS-CoV-2 RNA is generally detectable in upper respiratoy specimens during the acute phase of infection. The lowest concentration of SARS-CoV-2 viral copies this assay can detect is 131 copies/mL. A negative result does not preclude SARS-Cov-2 infection and should not be used as the sole basis for treatment or other patient management decisions.  A negative result may occur with  improper specimen collection/handling, submission of specimen other than nasopharyngeal swab, presence of viral mutation(s) within the areas targeted by this assay, and inadequate number of viral copies (<131 copies/mL). A negative result must be combined with clinical observations, patient history, and epidemiological information. The expected result is Negative. Fact Sheet for Patients:  PinkCheek.be Fact Sheet for Healthcare Providers:  GravelBags.it This test is not yet ap proved or cleared by the [place] FDA and  has been authorized for detection and/or diagnosis of SARS-CoV-2 by FDA under an Emergency Use Authorization (EUA). This EUA will remain  in effect (meaning this test can be used) for the duration of the COVID-19 declaration under Section 564(b)(1) of the Act, 21 U.S.C. section 360bbb-3(b)(1), unless the authorization is terminated or revoked sooner.    Influenza A by PCR NEGATIVE NEGATIVE Final   Influenza B by PCR NEGATIVE NEGATIVE Final    Comment: (NOTE) The Xpert Xpress SARS-CoV-2/FLU/RSV assay is intended as an aid in   the diagnosis of influenza from Nasopharyngeal swab specimens and  should not be used as a sole basis for treatment. Nasal washings and  aspirates are unacceptable for Xpert Xpress SARS-CoV-2/FLU/RSV  testing. Fact Sheet for Patients: PinkCheek.be Fact Sheet for Healthcare Providers: GravelBags.it This test is not yet approved or cleared by the [place] FDA and  has been authorized for detection and/or diagnosis of SARS-CoV-2 by  FDA under an Emergency Use Authorization (EUA). This EUA will remain  in effect (meaning this test can be used) for the duration of the  Covid-19 declaration under Section 564(b)(1) of the Act, 21  U.S.C. section 360bbb-3(b)(1), unless the authorization is  terminated or revoked. Performed at [facility], [address] [place], [place] [address]   Group A Strep by PCR     Status: None   Collection Time: 11/02/19 12:10 AM   Specimen: Throat; Sterile Swab  Result Value Ref Range Status   Group A Strep by PCR NOT DETECTED NOT DETECTED Final    Comment: Performed at [facility], [address] [place], [place] [address]  MRSA PCR Screening     Status: None   Collection Time: 11/02/19  9:20 AM   Specimen: Nasal Mucosa; Nasopharyngeal  Result Value Ref Range Status   MRSA by PCR NEGATIVE NEGATIVE Final    Comment:        The GeneXpert MRSA Assay (FDA approved for NASAL specimens only), is one component of a comprehensive MRSA colonization surveillance program. It is not intended to diagnose MRSA infection nor to guide or monitor treatment for MRSA infections. Performed at [facility], [address], [place], [place] [address]       Studies: No results found.  Scheduled Meds: . Chlorhexidine Gluconate Cloth  6 each Topical Daily  . folic acid  1 mg Oral Daily  . multivitamin with minerals  1 tablet Oral Daily  . pantoprazole (PROTONIX) IV  40 mg Intravenous Q12H  . phenobarbital   32.4 mg Oral BID  . phenytoin  100 mg Oral TID  . sucralfate  1 g Oral TID WC & HS  . thiamine  100 mg Oral Daily   Or  . thiamine  100 mg Intravenous Daily    Continuous Infusions: . sodium chloride 100 mL/hr at 11/04/19 0907  . cefTRIAXone (ROCEPHIN)  IV 2 g (11/03/19 1403)  . sodium chloride Stopped (11/02/19 0050)     LOS: 2 days     Yehuda Savannah  Matilde Sprang, MD [facility]  To reach me or the doctor on call, go to: [email or web address] Password [facility]  11/04/2019, 11:55 AM

## 2019-11-04 NOTE — Progress Notes (Signed)
Patient denies any neck pain or difficulty/painful swallowing.  Tolerating a full liquid diet. Wants regular food Discussed with Dr. Marthenia Rolling  Vital signs in last 24 hours: Temp:  [98.2 F (36.8 C)-99 F (37.2 C)] 98.7 F (37.1 C) (01/17 1054) Pulse Rate:  [81-98] 86 (01/17 1054) Resp:  [16-24] 18 (01/17 1054) BP: (121-154)/(56-70) 124/64 (01/17 1000) SpO2:  [96 %-100 %] 99 % (01/17 1054) Weight:  [76.6 kg] 76.6 kg (01/17 0600) Last BM Date: 11/03/19  Seen and examined with Dr. Marthenia Rolling  General:   Alert,  pleasant and cooperative in NAD Abdomen:  Soft, nontender and nondistended.  Normal bowel sounds, without guarding, and without rebound.  No mass or organomegaly. Extremities:  Without clubbing or edema.    Intake/Output from previous day: 01/16 0701 - 01/17 0700 In: 1100 [I.V.:1000; IV Piggyback:100] Out: 3050 [Urine:3050] Intake/Output this shift: Total I/O In: -  Out: 450 [Urine:450]  Lab Results: Recent Labs    11/01/19 2113 11/01/19 2113 11/02/19 0443 11/02/19 1450 11/02/19 2246 11/03/19 0601 11/04/19 0632  WBC 6.0  --  7.2  --   --   --  3.7*  HGB 11.5*   < > 9.4*   < > 10.0* 9.2* 9.0*  HCT 36.6   < > 30.5*   < > 32.6* 29.4* 29.0*  PLT 214  --  141*  --   --   --  119*   < > = values in this interval not displayed.   BMET Recent Labs    11/01/19 2113 11/02/19 0443 11/04/19 0632  NA 138 140 142  K 2.9* 3.9 3.6  CL 95* 108 111  CO2 29 25 24   GLUCOSE 146* 120* 104*  BUN 20 23 <5*  CREATININE 2.43* 1.55* 0.64  CALCIUM 9.5 8.5* 8.3*   LFT Recent Labs    11/02/19 0443 11/02/19 0443 11/04/19 0632  PROT 6.6  --   --   ALBUMIN 2.9*   < > 2.7*  AST 26  --   --   ALT 20  --   --   ALKPHOS 71  --   --   BILITOT 0.7  --   --    < > = values in this interval not displayed.   PT/INR Recent Labs    11/01/19 2113  LABPROT 14.3  INR 1.1    Impression: 64 year old lady with chronic alcoholic liver disease ongoing alcohol abuse admitted to the  hospital with odynophagia/ dysphagia some coffee ground emesis. History of severe chronic ulcerative/erosive reflux esophagitis. Doing well with diet.  Clinically, no further bleeding.  Odynophagia / dysphagia essentially resolved.   Recommendations: Heart healthy diet.  Continue twice daily PPI.  Carafate slurry's 1 g 4 times daily x6 more days EtOH abstinence.  No need for EGD or CT of the neck at this time Complete 7-day course of oral antibiotics. From a GI standpoint, okay to transition out of the ICU.  Anticipate discharge in the next 24 to 48 hours. Follow-up in short interval with Dr. [name]  Discussed with Dr. Marthenia Rolling at the bedside.

## 2019-11-05 DIAGNOSIS — N179 Acute kidney failure, unspecified: Principal | ICD-10-CM

## 2019-11-05 DIAGNOSIS — K7460 Unspecified cirrhosis of liver: Secondary | ICD-10-CM

## 2019-11-05 MED ORDER — ONDANSETRON HCL 4 MG PO TABS: 4.0000 mg | ORAL_TABLET | Freq: Three times a day (TID) | ORAL | 0 refills | Status: DC | PRN

## 2019-11-05 MED ORDER — PANTOPRAZOLE SODIUM 40 MG PO TBEC: 40.0000 mg | DELAYED_RELEASE_TABLET | Freq: Two times a day (BID) | ORAL | 0 refills | Status: DC

## 2019-11-05 MED ORDER — SUCRALFATE 1 GM/10ML PO SUSP: 1.0000 g | Freq: Three times a day (TID) | ORAL | 0 refills | Status: DC

## 2019-11-05 NOTE — Progress Notes (Signed)
    Subjective: No abdominal pain. Felt nauseated after drinking coffee "quickly" but now resolved. No dysphagia/odynophagia. Tolerating diet.   Objective: Vital signs in last 24 hours: Temp:  [98.3 F (36.8 C)-98.7 F (37.1 C)] 98.6 F (37 C) (01/18 0500) Pulse Rate:  [76-98] 81 (01/18 0600) Resp:  [14-26] 14 (01/18 0600) BP: (115-163)/(60-80) 126/60 (01/18 0600) SpO2:  [99 %-100 %] 100 % (01/18 0600) Weight:  [75.8 kg] 75.8 kg (01/18 0500) Last BM Date: 11/03/19 General:   Alert and oriented, pleasant Head:  Normocephalic and atraumatic. Eyes:  No icterus, sclera clear. Conjuctiva pink.  Abdomen:  Bowel sounds present, soft, non-tender, non-distended. No HSM or hernias noted. No rebound or guarding. No masses appreciated  Extremities:  Without  edema. Neurologic:  Alert and  oriented x4  Intake/Output from previous day: 01/17 0701 - 01/18 0700 In: 1600 [I.V.:1000; IV Piggyback:600] Out: 3250 [Urine:3250] Intake/Output this shift: No intake/output data recorded.  Lab Results: Recent Labs    11/02/19 2246 11/03/19 0601 11/04/19 0632  WBC  --   --  3.7*  HGB 10.0* 9.2* 9.0*  HCT 32.6* 29.4* 29.0*  PLT  --   --  119*   BMET Recent Labs    11/04/19 0632  NA 142  K 3.6  CL 111  CO2 24  GLUCOSE 104*  BUN <5*  CREATININE 0.64  CALCIUM 8.3*   LFT Recent Labs    11/04/19 [id number]  ALBUMIN 2.7*    Assessment: 64 year old female with history of alcohol abuse, cirrhosis, presenting with coffee-ground emesis, black stool, odynophagia/dysphagia, with known history of severe chronic ulcerative/erosive reflux esohpagitis. Clinically improved since admission, with odynophagia/dysphagia resolved and no further overt GI bleeding. Plans for discharge today.      Plan: Complete total of 7 days empiric antibiotics due to GI bleed in known history of cirrhosis PPI BID Continue Carafate QID for total of 7 days Will arrange outpatient follow-up Discharge today   Annitta Needs, PhD, ANP-BC [facility]      LOS: 3 days    11/05/2019, 8:45 AM

## 2019-11-05 NOTE — Discharge Summary (Signed)
Physician Discharge Summary  [name] [id number] DOB: 1956-08-17 DOA: 11/01/2019  PCP: [name], MD  Admit date: 11/01/2019 Discharge date: 11/05/2019  Admitted From: Home Disposition: Home  Recommendations for Outpatient Follow-up:  1. Follow up with PCP in 1-2 weeks 2. Please follow up on the following pending results:  Home Health: Continue home health services as before. Equipment/Devices: None.  Discharge Condition: Stable. CODE STATUS: Full code. Diet recommendation: Regular  Brief/Interim Summary: This is a 64 year old lady who was admitted with complaints of abdominal pain and hematemesis.  Her admitting hemoglobin was 11.5.  The following day, her hemoglobin had dropped to 9.4.  Gastroenterology was consulted.  No procedure was done as this was self-limited hematemesis.  She did not require blood transfusion.  She was treated appropriately with PPI, Carafate.  Today, she tells me that she does not drink any alcohol.  She does have home health services which will be continued. Please see initial history and physical examination for initial evaluation.  Discharge Diagnoses:  Principal Problem:   AKI (acute kidney injury) ([place]) Active Problems:   Alcohol abuse   Hypokalemia   Seizure disorder ([place])   Coffee ground emesis   Cirrhosis of liver without ascites ([place])   History of stroke   Acute blood loss anemia   Overweight (BMI 25.0-29.9)    Discharge Instructions  Discharge Instructions    Diet - low sodium heart healthy   Complete by: As directed    Increase activity slowly   Complete by: As directed          Consultations:  Gastroenterology   Procedures/Studies: CT ABDOMEN PELVIS W CONTRAST  Result Date: 10/23/2019 CLINICAL DATA:  Abdominal pain and distension EXAM: CT ABDOMEN AND PELVIS WITH CONTRAST TECHNIQUE: Multidetector CT imaging of the abdomen and pelvis was performed using the standard protocol following bolus administration of  intravenous contrast. CONTRAST:  155mL OMNIPAQUE IOHEXOL 300 MG/ML  SOLN COMPARISON:  04/10/2019, 10/17/2019 FINDINGS: Lower chest: Lung bases are free of acute infiltrate or sizable effusion. Small sliding-type hiatal hernia is noted. Hepatobiliary: Gallbladder is within normal limits. Mild nodularity to the liver is seen similar to that noted on prior ultrasound examination. Pancreas: Unremarkable. No pancreatic ductal dilatation or surrounding inflammatory changes. Spleen: Normal in size without focal abnormality. Adrenals/Urinary Tract: Adrenal glands are within normal limits. Kidneys are well visualized bilaterally. No renal calculi or obstructive changes are seen. Normal excretion of contrast is seen. The bladder is partially distended. Stomach/Bowel: Diverticular change of the colon is noted. No diverticulitis is seen. The appendix is within normal limits. Stomach is within normal limits aside from the small sliding-type hiatal hernia. No small bowel abnormality is seen. Vascular/Lymphatic: Aortic atherosclerosis. No enlarged abdominal or pelvic lymph nodes. Reproductive: Status post hysterectomy. No adnexal masses. Other: No abdominal wall hernia or abnormality. No abdominopelvic ascites. Musculoskeletal: Degenerative changes of lumbar spine are noted. IMPRESSION: Diverticulosis without evidence of diverticulitis. Mild cirrhotic changes of the liver. Normal-appearing appendix. No other focal abnormality is noted. Electronically Signed   By: Inez Catalina M.D.   On: 10/23/2019 16:59   DG Chest Port 1 View  Result Date: 11/01/2019 CLINICAL DATA:  Vomiting blood EXAM: PORTABLE CHEST 1 VIEW COMPARISON:  01/22/2019 FINDINGS: Heart and mediastinal contours are within normal limits. No focal opacities or effusions. No acute bony abnormality. Degenerative changes in the shoulders. IMPRESSION: No active disease. Electronically Signed   By: Rolm Baptise M.D.   On: 11/01/2019 23:04   US ABDOMEN LIMITED  RUQ  Result Date: 10/17/2019 CLINICAL DATA:  Hepatic cirrhosis EXAM: ULTRASOUND ABDOMEN LIMITED RIGHT UPPER QUADRANT COMPARISON:  March 22, 2019 FINDINGS: Gallbladder: Within the gallbladder, there is a 4 mm echogenic focus which neither moves nor shadows, a presumed polyp. There may be 1-2 mm polyps elsewhere. There are no echogenic foci in the gallbladder which move and shadow as is expected with gallstones. No gallbladder wall thickening or pericholecystic fluid. No sonographic Murphy sign noted by sonographer. Common bile duct: Diameter: 4 mm. No intrahepatic or extrahepatic biliary duct dilatation. Liver: No focal lesion identified. Liver contour is subtly nodular with overall increase in liver echogenicity. Portal vein is patent on color Doppler imaging with normal direction of blood flow towards the liver. Other: None. IMPRESSION: 1. Liver again has a somewhat nodular appearance with increased liver echogenicity, findings felt to be indicative of a degree of hepatic cirrhosis. While no focal liver lesions are evident on this study, it must be cautioned that the sensitivity of ultrasound for detection of focal liver lesions is diminished in this circumstance. 2. Apparent gallbladder polyps with largest polyp measuring 4 mm. Per consensus guidelines, polyps of this size do not warrant additional imaging surveillance. No gallstones, gallbladder wall thickening, or pericholecystic fluid. Electronically Signed   By: Lowella Grip III M.D.   On: 10/17/2019 14:28       Subjective: She feels well today and there is no further hematemesis.  She has tolerated breakfast but she did have an episode of vomiting which was not hematemesis.  She thinks she had her coffee too quickly or it might have been orange juice that she drank this morning.   Discharge Exam: Vitals:   11/05/19 0700 11/05/19 0800 11/05/19 0850 11/05/19 0900  BP: 112/60 137/68  (!) 160/82  Pulse: 72 73  94  Resp: 17 18  15   Temp:       TempSrc:      SpO2: 100% 100% 100% 100%  Weight:      Height:        She looks systemically well.  She is hemodynamically stable.  Abdomen is soft and nontender.  She is alert and orientated.    The results of significant diagnostics from this hospitalization (including imaging, microbiology, ancillary and laboratory) are listed below for reference.     Microbiology: Recent Results (from the past 240 hour(s))  Respiratory Panel by RT PCR (Flu A&B, Covid) - Nasopharyngeal Swab     Status: None   Collection Time: 11/01/19 11:12 PM   Specimen: Nasopharyngeal Swab  Result Value Ref Range Status   SARS Coronavirus 2 by RT PCR NEGATIVE NEGATIVE Final    Comment: (NOTE) SARS-CoV-2 target nucleic acids are NOT DETECTED. The SARS-CoV-2 RNA is generally detectable in upper respiratoy specimens during the acute phase of infection. The lowest concentration of SARS-CoV-2 viral copies this assay can detect is 131 copies/mL. A negative result does not preclude SARS-Cov-2 infection and should not be used as the sole basis for treatment or other patient management decisions. A negative result may occur with  improper specimen collection/handling, submission of specimen other than nasopharyngeal swab, presence of viral mutation(s) within the areas targeted by this assay, and inadequate number of viral copies (<131 copies/mL). A negative result must be combined with clinical observations, patient history, and epidemiological information. The expected result is Negative. Fact Sheet for Patients:  PinkCheek.be Fact Sheet for Healthcare Providers:  GravelBags.it This test is not yet ap proved or cleared by the [place]  FDA and  has been authorized for detection and/or diagnosis of SARS-CoV-2 by FDA under an Emergency Use Authorization (EUA). This EUA will remain  in effect (meaning this test can be used) for the duration of  the COVID-19 declaration under Section 564(b)(1) of the Act, 21 U.S.C. section 360bbb-3(b)(1), unless the authorization is terminated or revoked sooner.    Influenza A by PCR NEGATIVE NEGATIVE Final   Influenza B by PCR NEGATIVE NEGATIVE Final    Comment: (NOTE) The Xpert Xpress SARS-CoV-2/FLU/RSV assay is intended as an aid in  the diagnosis of influenza from Nasopharyngeal swab specimens and  should not be used as a sole basis for treatment. Nasal washings and  aspirates are unacceptable for Xpert Xpress SARS-CoV-2/FLU/RSV  testing. Fact Sheet for Patients: PinkCheek.be Fact Sheet for Healthcare Providers: GravelBags.it This test is not yet approved or cleared by the [place] FDA and  has been authorized for detection and/or diagnosis of SARS-CoV-2 by  FDA under an Emergency Use Authorization (EUA). This EUA will remain  in effect (meaning this test can be used) for the duration of the  Covid-19 declaration under Section 564(b)(1) of the Act, 21  U.S.C. section 360bbb-3(b)(1), unless the authorization is  terminated or revoked. Performed at [facility], [address] [place], [place] [address]   Group A Strep by PCR     Status: None   Collection Time: 11/02/19 12:10 AM   Specimen: Throat; Sterile Swab  Result Value Ref Range Status   Group A Strep by PCR NOT DETECTED NOT DETECTED Final    Comment: Performed at [facility], [address], [place], [place] [address]  MRSA PCR Screening     Status: None   Collection Time: 11/02/19  9:20 AM   Specimen: Nasal Mucosa; Nasopharyngeal  Result Value Ref Range Status   MRSA by PCR NEGATIVE NEGATIVE Final    Comment:        The GeneXpert MRSA Assay (FDA approved for NASAL specimens only), is one component of a comprehensive MRSA colonization surveillance program. It is not intended to diagnose MRSA infection nor to guide or monitor treatment for MRSA  infections. Performed at [facility], [address] [place], [place] [address]      Labs: BNP (last 3 results) No results for input(s): BNP in the last 8760 hours. Basic Metabolic Panel: Recent Labs  Lab 11/01/19 2113 11/02/19 0443 11/04/19 0632  NA 138 140 142  K 2.9* 3.9 3.6  CL 95* 108 111  CO2 29 25 24   GLUCOSE 146* 120* 104*  BUN 20 23 <5*  CREATININE 2.43* 1.55* 0.64  CALCIUM 9.5 8.5* 8.3*  MG  --   --  1.4*  PHOS  --   --  2.9   Liver Function Tests: Recent Labs  Lab 11/01/19 2113 11/02/19 0443 11/04/19 0632  AST 40 26  --   ALT 25 20  --   ALKPHOS 89 71  --   BILITOT 1.0 0.7  --   PROT 8.5* 6.6  --   ALBUMIN 3.9 2.9* 2.7*   Recent Labs  Lab 11/01/19 2113  LIPASE 26   No results for input(s): AMMONIA in the last 168 hours. CBC: Recent Labs  Lab 11/01/19 2113 11/01/19 2113 11/02/19 0443 11/02/19 1450 11/02/19 2246 11/03/19 0601 11/04/19 0632  WBC 6.0  --  7.2  --   --   --  3.7*  HGB 11.5*   < > 9.4* 10.3* 10.0* 9.2* 9.0*  HCT 36.6   < >  30.5* 33.8* 32.6* 29.4* 29.0*  MCV 92.2  --  94.7  --   --   --  92.7  PLT 214  --  141*  --   --   --  119*   < > = values in this interval not displayed.   Cardiac Enzymes: No results for input(s): CKTOTAL, CKMB, CKMBINDEX, TROPONINI in the last 168 hours. BNP: Invalid input(s): POCBNP CBG: No results for input(s): GLUCAP in the last 168 hours. D-Dimer No results for input(s): DDIMER in the last 72 hours. Hgb A1c No results for input(s): HGBA1C in the last 72 hours. Lipid Profile No results for input(s): CHOL, HDL, LDLCALC, TRIG, CHOLHDL, LDLDIRECT in the last 72 hours. Thyroid function studies No results for input(s): TSH, T4TOTAL, T3FREE, THYROIDAB in the last 72 hours.  Invalid input(s): FREET3 Anemia work up No results for input(s): VITAMINB12, FOLATE, FERRITIN, TIBC, IRON, RETICCTPCT in the last 72 hours. Urinalysis    Component Value Date/Time   COLORURINE YELLOW 11/02/2019 0550    APPEARANCEUR HAZY (A) 11/02/2019 0550   LABSPEC 1.014 11/02/2019 0550   PHURINE 5.0 11/02/2019 0550   GLUCOSEU NEGATIVE 11/02/2019 0550   HGBUR NEGATIVE 11/02/2019 0550   BILIRUBINUR NEGATIVE 11/02/2019 0550   KETONESUR NEGATIVE 11/02/2019 0550   PROTEINUR NEGATIVE 11/02/2019 0550   UROBILINOGEN 0.2 12/26/2014 1927   NITRITE NEGATIVE 11/02/2019 0550   LEUKOCYTESUR NEGATIVE 11/02/2019 0550   Sepsis Labs Invalid input(s): PROCALCITONIN,  WBC,  LACTICIDVEN Microbiology Recent Results (from the past 240 hour(s))  Respiratory Panel by RT PCR (Flu A&B, Covid) - Nasopharyngeal Swab     Status: None   Collection Time: 11/01/19 11:12 PM   Specimen: Nasopharyngeal Swab  Result Value Ref Range Status   SARS Coronavirus 2 by RT PCR NEGATIVE NEGATIVE Final    Comment: (NOTE) SARS-CoV-2 target nucleic acids are NOT DETECTED. The SARS-CoV-2 RNA is generally detectable in upper respiratoy specimens during the acute phase of infection. The lowest concentration of SARS-CoV-2 viral copies this assay can detect is 131 copies/mL. A negative result does not preclude SARS-Cov-2 infection and should not be used as the sole basis for treatment or other patient management decisions. A negative result may occur with  improper specimen collection/handling, submission of specimen other than nasopharyngeal swab, presence of viral mutation(s) within the areas targeted by this assay, and inadequate number of viral copies (<131 copies/mL). A negative result must be combined with clinical observations, patient history, and epidemiological information. The expected result is Negative. Fact Sheet for Patients:  PinkCheek.be Fact Sheet for Healthcare Providers:  GravelBags.it This test is not yet ap proved or cleared by the [place] FDA and  has been authorized for detection and/or diagnosis of SARS-CoV-2 by FDA under an Emergency Use Authorization  (EUA). This EUA will remain  in effect (meaning this test can be used) for the duration of the COVID-19 declaration under Section 564(b)(1) of the Act, 21 U.S.C. section 360bbb-3(b)(1), unless the authorization is terminated or revoked sooner.    Influenza A by PCR NEGATIVE NEGATIVE Final   Influenza B by PCR NEGATIVE NEGATIVE Final    Comment: (NOTE) The Xpert Xpress SARS-CoV-2/FLU/RSV assay is intended as an aid in  the diagnosis of influenza from Nasopharyngeal swab specimens and  should not be used as a sole basis for treatment. Nasal washings and  aspirates are unacceptable for Xpert Xpress SARS-CoV-2/FLU/RSV  testing. Fact Sheet for Patients: PinkCheek.be Fact Sheet for Healthcare Providers: GravelBags.it This test is not yet approved or  cleared by the [place] and  has been authorized for detection and/or diagnosis of SARS-CoV-2 by  FDA under an Emergency Use Authorization (EUA). This EUA will remain  in effect (meaning this test can be used) for the duration of the  Covid-19 declaration under Section 564(b)(1) of the Act, 21  U.S.C. section 360bbb-3(b)(1), unless the authorization is  terminated or revoked. Performed at [facility], [address], [place], [place] [address]   Group A Strep by PCR     Status: None   Collection Time: 11/02/19 12:10 AM   Specimen: Throat; Sterile Swab  Result Value Ref Range Status   Group A Strep by PCR NOT DETECTED NOT DETECTED Final    Comment: Performed at [facility], [address] [place], [place] [address]  MRSA PCR Screening     Status: None   Collection Time: 11/02/19  9:20 AM   Specimen: Nasal Mucosa; Nasopharyngeal  Result Value Ref Range Status   MRSA by PCR NEGATIVE NEGATIVE Final    Comment:        The GeneXpert MRSA Assay (FDA approved for NASAL specimens only), is one component of a comprehensive MRSA colonization surveillance program. It is  not intended to diagnose MRSA infection nor to guide or monitor treatment for MRSA infections. Performed at [facility], [address], [place], [place] [address]      Time coordinating discharge: Less than 30 minutes  SIGNED:   Doree Albee, MD  [facility] 11/05/2019, 9:31 AM   If 7PM-7AM, please contact night-coverage [email or web address]

## 2019-11-05 NOTE — Progress Notes (Addendum)
Pt given discharge instructions with understanding. Pt has no questions at this time. IV and monitor d/c. Pt informed to follow up with her PCP after discharge Pt has someone to take her home.

## 2019-11-05 NOTE — Progress Notes (Signed)
Pt had vomiting episode after eating this am, yellow in color. MD at bedside and made aware. Will continue to monitor.

## 2019-11-05 NOTE — Care Management Important Message (Signed)
Important Message  Patient Details  Name: [name] MRN: [id number] Date of Birth: [birth year]   Medicare Important Message Given:  Yes     Tommy Medal 11/05/2019, 12:24 PM

## 2019-11-06 ENCOUNTER — Ambulatory Visit (INDEPENDENT_AMBULATORY_CARE_PROVIDER_SITE_OTHER): Payer: Medicare Other | Admitting: Orthopaedic Surgery

## 2019-11-06 ENCOUNTER — Encounter: Payer: Self-pay | Admitting: Orthopaedic Surgery

## 2019-11-06 ENCOUNTER — Other Ambulatory Visit: Payer: Self-pay

## 2019-11-06 VITALS — Temp 97.2°F | Ht 64.0 in | Wt 165.0 lb

## 2019-11-06 DIAGNOSIS — G8929 Other chronic pain: Secondary | ICD-10-CM | POA: Diagnosis not present

## 2019-11-06 DIAGNOSIS — M25562 Pain in left knee: Secondary | ICD-10-CM | POA: Diagnosis not present

## 2019-11-06 NOTE — Progress Notes (Signed)
PROCEDURE NOTE:  The patient requests injections of the left knee , verbal consent was obtained.  The left knee was prepped appropriately after time out was performed.   Sterile technique was observed and injection of 1 cc of Depo-Medrol 40 mg with several cc's of plain xylocaine. Anesthesia was provided by ethyl chloride and a 20-gauge needle was used to inject the knee area. The injection was tolerated well.  A band aid dressing was applied.  The patient was advised to apply ice later today and tomorrow to the injection sight as needed.  I will see as needed.  Electronically Signed Sanjuana Kava, MD 1/19/202110:04 AM

## 2019-11-15 ENCOUNTER — Other Ambulatory Visit: Payer: Self-pay | Admitting: Gastroenterology

## 2019-11-15 NOTE — Telephone Encounter (Signed)
Reviewed PMP database. Rx interval appropriate. OD risk score 200.

## 2019-11-20 ENCOUNTER — Other Ambulatory Visit (INDEPENDENT_AMBULATORY_CARE_PROVIDER_SITE_OTHER): Payer: Self-pay | Admitting: Internal Medicine

## 2019-11-28 ENCOUNTER — Other Ambulatory Visit (INDEPENDENT_AMBULATORY_CARE_PROVIDER_SITE_OTHER): Payer: Self-pay | Admitting: Internal Medicine

## 2019-12-18 ENCOUNTER — Encounter: Payer: Self-pay | Admitting: Gastroenterology

## 2019-12-18 ENCOUNTER — Telehealth: Payer: Self-pay | Admitting: Gastroenterology

## 2019-12-18 ENCOUNTER — Ambulatory Visit: Payer: Medicare Other | Admitting: Nurse Practitioner

## 2019-12-18 NOTE — Progress Notes (Deleted)
Referring Provider: [name], MD Primary Care Physician:  [name], MD Primary GI:  Dr. [name]  No chief complaint on file.   HPI:   [name] is a 64 y.o. female who presents for follow-up on GERD and diarrhea.  The patient was last seen in our office 11/01/2019 for GERD, odynophagia, noted gait disturbance, CVA, seizures.  Noted past medical history of right hand contracture and stroke, difficulty with ambulation since hospital admission in December 2020 for rectal bleeding and noted generalized weakness, unsteadiness.  Requires aid to assist with ambulation.  Occasional falls but no head injury.  Having throat pain, noted to be out of her "stomach pills."  Difficulty with constipation.  Heartburn daily, has been out of Protonix.  Zofran every 6 hours, no vomiting.  Feels constipated.  Recent CT abdomen and pelvis in January 2021 with no acute intra-abdominal process.  No other overt GI complaints.  Recommended continue Protonix twice daily, follow-up with primary care for possible strep throat, soft foods or liquids until the throat pain resolves, viscous lidocaine as needed, Hycet every 4 hours as needed, follow-up with ENT for throat pain if no resolution in 2 weeks (referral has been made).  Follow-up in 6 months.  It appears the patient was admitted from 11/01/2019 through 11/05/2019 for upper GI bleed.  Presented with complaints of abdominal pain and hematemesis with admitting hemoglobin 11.5 which declined to 9.4 the next day.  No procedure done due to self-limiting hematemesis.  No blood transfusion.  Treated with PPI and Carafate.  Today she states   Past Medical History:  Diagnosis Date  . Alcohol abuse   . Asthma   . Contracture of muscle of hand    right  . GERD (gastroesophageal reflux disease)   . HTN (hypertension)   . PUD (peptic ulcer disease)    remote  . Seizures ([place])    since stroke, no recent seizures  . Stroke [facility])    age 74, required brain  surgery    Past Surgical History:  Procedure Laterality Date  . ABDOMINAL HYSTERECTOMY  2004   complete  . BRAIN SURGERY     age 61, stroke  . COLONOSCOPY    . COLONOSCOPY WITH PROPOFOL  10/03/2012   Dr. [name]: moderate diverticulosis, small internal hemorrhoids, TUBULAR ADENOMA. Next colonoscopy 2023 per Dr. [name].  . ESOPHAGOGASTRODUODENOSCOPY N/A 10/18/2014   Dr. Gala Romney during inpatient hospitalization: severe exudative/erosive reflux esophagitis as source of trivial upper GI bleed. No varices   . ESOPHAGOGASTRODUODENOSCOPY (EGD) WITH PROPOFOL  10/03/2012   Dr. [name]: stricture at Stedman junction s/p dilation, mild gastritis negative H.pylori   . ESOPHAGOGASTRODUODENOSCOPY (EGD) WITH PROPOFOL N/A 12/27/2017   Grade D esophagitis. Medium-sized hiatal hernia, normal duodenum  . right tib-fib fracture  2008  . SAVORY DILATION  10/03/2012   Procedure: SAVORY DILATION;  Surgeon: Danie Binder, MD;  Location: [facility];  Service: Endoscopy;  Laterality: N/A;  started at 1303,  dilated 12.8-16mm    Current Outpatient Medications  Medication Sig Dispense Refill  . acetaminophen (TYLENOL) 325 MG tablet Take 325-650 mg by mouth every 6 (six) hours as needed.    Marland Kitchen albuterol (PROVENTIL HFA;VENTOLIN HFA) 108 (90 BASE) MCG/ACT inhaler Inhale 2 puffs into the lungs every 6 (six) hours as needed for wheezing or shortness of breath.     Marland Kitchen albuterol (PROVENTIL) (2.5 MG/3ML) 0.083% nebulizer solution Take 2.5 mg by nebulization every 6 (six) hours as needed for wheezing or shortness  of breath.    . dicyclomine (BENTYL) 20 MG tablet Take 1 tablet (20 mg total) by mouth every 6 (six) hours as needed for spasms. 12 tablet 0  . ENSURE PLUS (ENSURE PLUS) LIQD Take 237 mLs by mouth 3 (three) times daily between meals.    . folic acid (FOLVITE) 1 MG tablet Take 1 tablet (1 mg total) by mouth daily. 30 tablet 0  . HYDROcodone-acetaminophen (HYCET) 7.5-325 mg/15 ml solution TAKE 10MLS BY MOUTH 4 TIMES DAILY AS  NEEDED. 120 mL 0  . ipratropium-albuterol (DUONEB) 0.5-2.5 (3) MG/3ML SOLN Take 3 mLs by nebulization every 6 (six) hours as needed (for shortness of breath/wheezing).     Marland Kitchen linaclotide (LINZESS) 145 MCG CAPS capsule Take 145 mcg by mouth daily before breakfast.    . lisinopril (ZESTRIL) 40 MG tablet Take 1 tablet (40 mg total) by mouth daily. 30 tablet 2  . Multiple Vitamin (MULTIVITAMIN WITH MINERALS) TABS tablet Take 1 tablet by mouth daily. 30 tablet 1  . ondansetron (ZOFRAN) 4 MG tablet Take 1 tablet (4 mg total) by mouth every 8 (eight) hours as needed for nausea or vomiting. 30 tablet 0  . pantoprazole (PROTONIX) 40 MG tablet Take 1 tablet (40 mg total) by mouth 2 (two) times daily before a meal. 60 tablet 0  . PHENobarbital (LUMINAL) 32.4 MG tablet Take 32.4 mg by mouth 2 (two) times daily.    . phenytoin (DILANTIN) 100 MG ER capsule Take 100 mg by mouth 3 (three) times daily.    . sucralfate (CARAFATE) 1 GM/10ML suspension Take 10 mLs (1 g total) by mouth 4 (four) times daily -  with meals and at bedtime. 420 mL 0  . thiamine 100 MG tablet Take 1 tablet (100 mg total) by mouth daily. 30 tablet 1   No current facility-administered medications for this visit.    Allergies as of 12/18/2019  . (No Known Allergies)    Family History  Problem Relation Age of Onset  . Liver disease Sister        etoh  . Colon cancer Neg Hx     Social History   Socioeconomic History  . Marital status: Single    Spouse name: Not on file  . Number of children: 3  . Years of education: Not on file  . Highest education level: Not on file  Occupational History  . Occupation: disability    Employer: UNEMPLOYED  Tobacco Use  . Smoking status: Former Smoker    Packs/day: 0.04    Years: 9.00    Pack years: 0.36    Types: Cigarettes    Quit date: 07/28/2014    Years since quitting: 5.3  . Smokeless tobacco: Never Used  Substance and Sexual Activity  . Alcohol use: Not Currently    Alcohol/week:  35.0 standard drinks    Types: 35 Cans of beer per week    Comment: 12 pack of beer twice per week; denied 11/01/19  . Drug use: No  . Sexual activity: Yes    Birth control/protection: Surgical  Other Topics Concern  . 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 HAS 3 KIDS: ONE IN [place], ONE IN [place], AND ONE OOT.   Social Determinants of Health   Financial Resource Strain:   . Difficulty of Paying Living Expenses: Not on file  Food Insecurity:   . Worried About Charity fundraiser in the Last Year: Not on file  .  Ran Out of Food in the Last Year: Not on file  Transportation Needs:   . Lack of Transportation (Medical): Not on file  . Lack of Transportation (Non-Medical): Not on file  Physical Activity:   . Days of Exercise per Week: Not on file  . Minutes of Exercise per Session: Not on file  Stress:   . Feeling of Stress : Not on file  Social Connections:   . Frequency of Communication with Friends and Family: Not on file  . Frequency of Social Gatherings with Friends and Family: Not on file  . Attends Religious Services: Not on file  . Active Member of Clubs or Organizations: Not on file  . Attends Archivist Meetings: Not on file  . Marital Status: Not on file    Review of Systems: General: Negative for anorexia, weight loss, fever, chills, fatigue, weakness. Eyes: Negative for vision changes.  ENT: Negative for hoarseness, difficulty swallowing , nasal congestion. CV: Negative for chest pain, angina, palpitations, dyspnea on exertion, peripheral edema.  Respiratory: Negative for dyspnea at rest, dyspnea on exertion, cough, sputum, wheezing.  GI: See history of present illness. GU:  Negative for dysuria, hematuria, urinary incontinence, urinary frequency, nocturnal urination.  MS: Negative for joint pain, low back pain.  Derm: Negative for rash or itching.  Neuro: Negative for weakness, abnormal sensation, seizure, frequent  headaches, memory loss, confusion.  Psych: Negative for anxiety, depression, suicidal ideation, hallucinations.  Endo: Negative for unusual weight change.  Heme: Negative for bruising or bleeding. Allergy: Negative for rash or hives.   Physical Exam: There were no vitals taken for this visit. General:   Alert and oriented. Pleasant and cooperative. Well-nourished and well-developed.  Head:  Normocephalic and atraumatic. Eyes:  Without icterus, sclera clear and conjunctiva pink.  Ears:  Normal auditory acuity. Mouth:  No deformity or lesions, oral mucosa pink.  Throat/Neck:  Supple, without mass or thyromegaly. Cardiovascular:  S1, S2 present without murmurs appreciated. Normal pulses noted. Extremities without clubbing or edema. Respiratory:  Clear to auscultation bilaterally. No wheezes, rales, or rhonchi. No distress.  Gastrointestinal:  +BS, soft, non-tender and non-distended. No HSM noted. No guarding or rebound. No masses appreciated.  Rectal:  Deferred  Musculoskalatal:  Symmetrical without gross deformities. Normal posture. Skin:  Intact without significant lesions or rashes. Neurologic:  Alert and oriented x4;  grossly normal neurologically. Psych:  Alert and cooperative. Normal mood and affect. Heme/Lymph/Immune: No significant cervical adenopathy. No excessive bruising noted.    12/18/2019 8:06 AM   Disclaimer: This note was dictated with voice recognition software. Similar sounding words can inadvertently be transcribed and may not be corrected upon review.

## 2019-12-18 NOTE — Telephone Encounter (Signed)
PATIENT WAS A NO SHOW AND LETTER SENT  °

## 2019-12-27 ENCOUNTER — Telehealth: Payer: Self-pay | Admitting: *Deleted

## 2019-12-27 NOTE — Telephone Encounter (Signed)
Per Dr. Deeann Saint office  "Patient cancelled appointment again. We will make NO MORE appointments for this patient"  FYI to [facility]

## 2019-12-27 NOTE — Telephone Encounter (Signed)
REVIEWED-NO ADDITIONAL RECOMMENDATIONS. 

## 2020-01-03 ENCOUNTER — Other Ambulatory Visit (INDEPENDENT_AMBULATORY_CARE_PROVIDER_SITE_OTHER): Payer: Self-pay | Admitting: Internal Medicine

## 2020-01-15 ENCOUNTER — Encounter: Payer: Self-pay | Admitting: Orthopaedic Surgery

## 2020-01-15 ENCOUNTER — Ambulatory Visit (INDEPENDENT_AMBULATORY_CARE_PROVIDER_SITE_OTHER): Payer: Medicare Other | Admitting: Orthopaedic Surgery

## 2020-01-15 ENCOUNTER — Other Ambulatory Visit: Payer: Self-pay

## 2020-01-15 VITALS — Ht 64.0 in | Wt 165.0 lb

## 2020-01-15 DIAGNOSIS — G8929 Other chronic pain: Secondary | ICD-10-CM

## 2020-01-15 DIAGNOSIS — M25561 Pain in right knee: Secondary | ICD-10-CM

## 2020-01-15 NOTE — Progress Notes (Signed)
PROCEDURE NOTE:  The patient requests injections of the right knee , verbal consent was obtained.  The right knee was prepped appropriately after time out was performed.   Sterile technique was observed and injection of 1 cc of Depo-Medrol 40 mg with several cc's of plain xylocaine. Anesthesia was provided by ethyl chloride and a 20-gauge needle was used to inject the knee area. The injection was tolerated well.  A band aid dressing was applied.  The patient was advised to apply ice later today and tomorrow to the injection sight as needed.  See as needed.  Electronically Signed Sanjuana Kava, MD 3/30/20219:50 AM

## 2020-01-24 ENCOUNTER — Other Ambulatory Visit (INDEPENDENT_AMBULATORY_CARE_PROVIDER_SITE_OTHER): Payer: Self-pay | Admitting: Internal Medicine

## 2020-01-30 ENCOUNTER — Other Ambulatory Visit: Payer: Self-pay | Admitting: Orthopaedic Surgery

## 2020-02-19 ENCOUNTER — Other Ambulatory Visit (HOSPITAL_COMMUNITY): Payer: Self-pay | Admitting: Internal Medicine

## 2020-02-19 DIAGNOSIS — Z1231 Encounter for screening mammogram for malignant neoplasm of breast: Secondary | ICD-10-CM

## 2020-03-11 ENCOUNTER — Encounter: Payer: Self-pay | Admitting: Gastroenterology

## 2020-03-11 ENCOUNTER — Telehealth: Payer: Self-pay | Admitting: Gastroenterology

## 2020-03-11 NOTE — Telephone Encounter (Signed)
RECALL FOR ULTRASOUND 

## 2020-03-11 NOTE — Telephone Encounter (Signed)
Recall sent 

## 2020-03-18 ENCOUNTER — Telehealth: Payer: Self-pay | Admitting: Gastroenterology

## 2020-03-18 DIAGNOSIS — K7030 Alcoholic cirrhosis of liver without ascites: Secondary | ICD-10-CM

## 2020-03-18 NOTE — Telephone Encounter (Signed)
Pt received letter that it was time to schedule her [place]. Please call her aide, Maceo, [phone]

## 2020-03-18 NOTE — Telephone Encounter (Signed)
Letter mailed

## 2020-03-28 ENCOUNTER — Other Ambulatory Visit: Payer: Self-pay

## 2020-03-28 ENCOUNTER — Ambulatory Visit (HOSPITAL_COMMUNITY)
Admission: RE | Admit: 2020-03-28 | Discharge: 2020-03-28 | Disposition: A | Discharge status: Home / Self Care | Payer: Medicare Other | Source: Ambulatory Visit | Attending: Internal Medicine | Admitting: Internal Medicine

## 2020-03-28 DIAGNOSIS — Z1231 Encounter for screening mammogram for malignant neoplasm of breast: Secondary | ICD-10-CM | POA: Diagnosis present

## 2020-04-02 ENCOUNTER — Encounter (HOSPITAL_COMMUNITY): Payer: Self-pay

## 2020-04-02 ENCOUNTER — Other Ambulatory Visit: Payer: Self-pay

## 2020-04-02 ENCOUNTER — Emergency Department (HOSPITAL_COMMUNITY): Payer: Medicare Other

## 2020-04-02 ENCOUNTER — Inpatient Hospital Stay (HOSPITAL_COMMUNITY)
Admission: AD | Admit: 2020-04-02 | Discharge: 2020-04-04 | DRG: 640 | Disposition: A | Discharge status: Home Health | Payer: Medicare Other | Attending: Internal Medicine | Admitting: Internal Medicine

## 2020-04-02 DIAGNOSIS — R030 Elevated blood-pressure reading, without diagnosis of hypertension: Secondary | ICD-10-CM | POA: Diagnosis present

## 2020-04-02 DIAGNOSIS — J45909 Unspecified asthma, uncomplicated: Secondary | ICD-10-CM | POA: Diagnosis present

## 2020-04-02 DIAGNOSIS — Z79899 Other long term (current) drug therapy: Secondary | ICD-10-CM | POA: Diagnosis not present

## 2020-04-02 DIAGNOSIS — I6930 Unspecified sequelae of cerebral infarction: Secondary | ICD-10-CM | POA: Diagnosis not present

## 2020-04-02 DIAGNOSIS — I10 Essential (primary) hypertension: Secondary | ICD-10-CM | POA: Diagnosis present

## 2020-04-02 DIAGNOSIS — R4781 Slurred speech: Secondary | ICD-10-CM | POA: Diagnosis present

## 2020-04-02 DIAGNOSIS — R410 Disorientation, unspecified: Secondary | ICD-10-CM | POA: Diagnosis not present

## 2020-04-02 DIAGNOSIS — D61818 Other pancytopenia: Secondary | ICD-10-CM | POA: Diagnosis present

## 2020-04-02 DIAGNOSIS — R531 Weakness: Secondary | ICD-10-CM

## 2020-04-02 DIAGNOSIS — Z87891 Personal history of nicotine dependence: Secondary | ICD-10-CM

## 2020-04-02 DIAGNOSIS — Z20822 Contact with and (suspected) exposure to covid-19: Secondary | ICD-10-CM | POA: Diagnosis present

## 2020-04-02 DIAGNOSIS — E871 Hypo-osmolality and hyponatremia: Principal | ICD-10-CM | POA: Diagnosis present

## 2020-04-02 DIAGNOSIS — G9341 Metabolic encephalopathy: Secondary | ICD-10-CM | POA: Diagnosis present

## 2020-04-02 DIAGNOSIS — F1010 Alcohol abuse, uncomplicated: Secondary | ICD-10-CM | POA: Diagnosis present

## 2020-04-02 DIAGNOSIS — Z8711 Personal history of peptic ulcer disease: Secondary | ICD-10-CM | POA: Diagnosis not present

## 2020-04-02 DIAGNOSIS — R569 Unspecified convulsions: Secondary | ICD-10-CM | POA: Diagnosis present

## 2020-04-02 LAB — BASIC METABOLIC PANEL
Anion gap: 10 (ref 5–15)
Anion gap: 10 (ref 5–15)
Anion gap: 12 (ref 5–15)
BUN: 9 mg/dL (ref 8–23)
BUN: 9 mg/dL (ref 8–23)
BUN: 11 mg/dL (ref 8–23)
CO2: 21 mmol/L — ABNORMAL LOW (ref 22–32)
CO2: 20 mmol/L — ABNORMAL LOW (ref 22–32)
CO2: 20 mmol/L — ABNORMAL LOW (ref 22–32)
Calcium: 8.9 mg/dL (ref 8.9–10.3)
Calcium: 9.2 mg/dL (ref 8.9–10.3)
Calcium: 9 mg/dL (ref 8.9–10.3)
Chloride: 92 mmol/L — ABNORMAL LOW (ref 98–111)
Chloride: 95 mmol/L — ABNORMAL LOW (ref 98–111)
Chloride: 95 mmol/L — ABNORMAL LOW (ref 98–111)
Creatinine, Ser: 0.78 mg/dL (ref 0.44–1.00)
Creatinine, Ser: 0.8 mg/dL (ref 0.44–1.00)
Creatinine, Ser: 0.84 mg/dL (ref 0.44–1.00)
GFR calc Af Amer: >60 mL/min (ref >60)
GFR calc Af Amer: >60 mL/min (ref >60)
GFR calc Af Amer: >60 mL/min (ref >60)
GFR calc non Af Amer: >60 mL/min (ref >60)
GFR calc non Af Amer: >60 mL/min (ref >60)
GFR calc non Af Amer: >60 mL/min (ref >60)
Glucose, Bld: 104 mg/dL — ABNORMAL HIGH (ref 70–99)
Glucose, Bld: 113 mg/dL — ABNORMAL HIGH (ref 70–99)
Glucose, Bld: 108 mg/dL — ABNORMAL HIGH (ref 70–99)
Potassium: 4.4 mmol/L (ref 3.5–5.1)
Potassium: 4.4 mmol/L (ref 3.5–5.1)
Potassium: 4.3 mmol/L (ref 3.5–5.1)
Sodium: 123 mmol/L — ABNORMAL LOW (ref 135–145)
Sodium: 125 mmol/L — ABNORMAL LOW (ref 135–145)
Sodium: 127 mmol/L — ABNORMAL LOW (ref 135–145)

## 2020-04-02 LAB — CBC WITH DIFFERENTIAL/PLATELET
Abs Immature Granulocytes: 0.01 10*3/uL (ref 0.00–0.07)
Basophils Absolute: 0 10*3/uL (ref 0.0–0.1)
Basophils Relative: 1 %
Eosinophils Absolute: 0 10*3/uL (ref 0.0–0.5)
Eosinophils Relative: 1 %
HCT: 31.8 % — ABNORMAL LOW (ref 36.0–46.0)
Hemoglobin: 10.5 g/dL — ABNORMAL LOW (ref 12.0–15.0)
Immature Granulocytes: 0 %
Lymphocytes Relative: 32 %
Lymphs Abs: 1 10*3/uL (ref 0.7–4.0)
MCH: 27.6 pg (ref 26.0–34.0)
MCHC: 33 g/dL (ref 30.0–36.0)
MCV: 83.5 fL (ref 80.0–100.0)
Monocytes Absolute: 0.4 10*3/uL (ref 0.1–1.0)
Monocytes Relative: 14 %
Neutro Abs: 1.6 10*3/uL — ABNORMAL LOW (ref 1.7–7.7)
Neutrophils Relative %: 52 %
Platelets: 101 10*3/uL — ABNORMAL LOW (ref 150–400)
RBC: 3.81 MIL/uL — ABNORMAL LOW (ref 3.87–5.11)
RDW: 16.3 % — ABNORMAL HIGH (ref 11.5–15.5)
WBC: 3 10*3/uL — ABNORMAL LOW (ref 4.0–10.5)
nRBC: 0 % (ref 0.0–0.2)

## 2020-04-02 LAB — URINALYSIS, ROUTINE W REFLEX MICROSCOPIC
Bilirubin Urine: NEGATIVE
Glucose, UA: NEGATIVE mg/dL
Hgb urine dipstick: NEGATIVE
Ketones, ur: NEGATIVE mg/dL
Leukocytes,Ua: NEGATIVE
Nitrite: NEGATIVE
Protein, ur: NEGATIVE mg/dL
Specific Gravity, Urine: 1.003 — ABNORMAL LOW (ref 1.005–1.030)
pH: 5 (ref 5.0–8.0)

## 2020-04-02 LAB — COMPREHENSIVE METABOLIC PANEL
ALT: 37 U/L (ref 0–44)
AST: 67 U/L — ABNORMAL HIGH (ref 15–41)
Albumin: 4.4 g/dL (ref 3.5–5.0)
Alkaline Phosphatase: 93 U/L (ref 38–126)
Anion gap: 13 (ref 5–15)
BUN: 10 mg/dL (ref 8–23)
CO2: 19 mmol/L — ABNORMAL LOW (ref 22–32)
Calcium: 9.7 mg/dL (ref 8.9–10.3)
Chloride: 89 mmol/L — ABNORMAL LOW (ref 98–111)
Creatinine, Ser: 0.86 mg/dL (ref 0.44–1.00)
GFR calc Af Amer: >60 mL/min (ref >60)
GFR calc non Af Amer: >60 mL/min (ref >60)
Glucose, Bld: 97 mg/dL (ref 70–99)
Potassium: 4.4 mmol/L (ref 3.5–5.1)
Sodium: 121 mmol/L — ABNORMAL LOW (ref 135–145)
Total Bilirubin: 0.8 mg/dL (ref 0.3–1.2)
Total Protein: 9 g/dL — ABNORMAL HIGH (ref 6.5–8.1)

## 2020-04-02 LAB — ETHANOL: Alcohol, Ethyl (B): <10 mg/dL (ref <10)

## 2020-04-02 LAB — OSMOLALITY: Osmolality: 256 mOsm/kg — ABNORMAL LOW (ref 275–295)

## 2020-04-02 LAB — AMMONIA: Ammonia: 19 umol/L (ref 9–35)

## 2020-04-02 LAB — HIV ANTIBODY (ROUTINE TESTING W REFLEX): HIV Screen 4th Generation wRfx: NONREACTIVE

## 2020-04-02 LAB — TSH: TSH: 1.203 u[IU]/mL (ref 0.350–4.500)

## 2020-04-02 MED ORDER — THIAMINE HCL 100 MG PO TABS
100.0000 mg | ORAL_TABLET | Freq: Every day | ORAL | Status: DC
  Administered 2020-04-02 – 2020-04-04 (×3): 100 mg via ORAL
  Filled 2020-04-02 (×3): qty 1

## 2020-04-02 MED ORDER — ADULT MULTIVITAMIN W/MINERALS CH
1.0000 | ORAL_TABLET | Freq: Every day | ORAL | Status: DC
  Administered 2020-04-02 – 2020-04-04 (×3): 1 via ORAL
  Filled 2020-04-02 (×3): qty 1

## 2020-04-02 MED ORDER — NAPROXEN 250 MG PO TABS
500.0000 mg | ORAL_TABLET | Freq: Two times a day (BID) | ORAL | Status: DC
  Administered 2020-04-02 – 2020-04-04 (×4): 500 mg via ORAL
  Filled 2020-04-02 (×5): qty 2

## 2020-04-02 MED ORDER — ONDANSETRON HCL 4 MG PO TABS: 4.0000 mg | ORAL_TABLET | Freq: Four times a day (QID) | ORAL | Status: DC | PRN

## 2020-04-02 MED ORDER — ONDANSETRON HCL 4 MG/2ML IJ SOLN: 4.0000 mg | Freq: Four times a day (QID) | INTRAMUSCULAR | Status: DC | PRN

## 2020-04-02 MED ORDER — SUCRALFATE 1 GM/10ML PO SUSP
1.0000 g | Freq: Three times a day (TID) | ORAL | Status: DC
  Administered 2020-04-02 – 2020-04-04 (×8): 1 g via ORAL
  Filled 2020-04-02 (×8): qty 10

## 2020-04-02 MED ORDER — DARIFENACIN HYDROBROMIDE ER 7.5 MG PO TB24
7.5000 mg | ORAL_TABLET | Freq: Every day | ORAL | Status: DC
  Administered 2020-04-02 – 2020-04-04 (×3): 7.5 mg via ORAL
  Filled 2020-04-02 (×6): qty 1

## 2020-04-02 MED ORDER — PANTOPRAZOLE SODIUM 40 MG PO TBEC
40.0000 mg | DELAYED_RELEASE_TABLET | Freq: Two times a day (BID) | ORAL | Status: DC
  Administered 2020-04-02 – 2020-04-04 (×4): 40 mg via ORAL
  Filled 2020-04-02 (×5): qty 1

## 2020-04-02 MED ORDER — ALBUTEROL SULFATE HFA 108 (90 BASE) MCG/ACT IN AERS: 2.0000 | INHALATION_SPRAY | Freq: Four times a day (QID) | RESPIRATORY_TRACT | Status: DC | PRN

## 2020-04-02 MED ORDER — LISINOPRIL 10 MG PO TABS
40.0000 mg | ORAL_TABLET | Freq: Every day | ORAL | Status: DC
  Administered 2020-04-02 – 2020-04-03 (×2): 40 mg via ORAL
  Filled 2020-04-02 (×3): qty 4

## 2020-04-02 MED ORDER — FOLIC ACID 1 MG PO TABS
1.0000 mg | ORAL_TABLET | Freq: Every day | ORAL | Status: DC
  Administered 2020-04-02 – 2020-04-04 (×3): 1 mg via ORAL
  Filled 2020-04-02 (×3): qty 1

## 2020-04-02 MED ORDER — PHENOBARBITAL 32.4 MG PO TABS
32.4000 mg | ORAL_TABLET | Freq: Two times a day (BID) | ORAL | Status: DC
  Administered 2020-04-02 – 2020-04-04 (×5): 32.4 mg via ORAL
  Filled 2020-04-02 (×5): qty 1

## 2020-04-02 MED ORDER — DICYCLOMINE HCL 10 MG PO CAPS: 20.0000 mg | ORAL_CAPSULE | Freq: Four times a day (QID) | ORAL | Status: DC | PRN

## 2020-04-02 MED ORDER — LORAZEPAM 2 MG/ML IJ SOLN: 2.0000 mg | INTRAMUSCULAR | Status: DC | PRN

## 2020-04-02 MED ORDER — LORAZEPAM 2 MG/ML IJ SOLN: 1.0000 mg | INTRAMUSCULAR | Status: DC | PRN

## 2020-04-02 MED ORDER — IOHEXOL 350 MG/ML SOLN
75.0000 mL | Freq: Once | INTRAVENOUS | Status: AC | PRN
  Administered 2020-04-02: 75 mL via INTRAVENOUS

## 2020-04-02 MED ORDER — PHENYTOIN SODIUM EXTENDED 100 MG PO CAPS
100.0000 mg | ORAL_CAPSULE | Freq: Three times a day (TID) | ORAL | Status: DC
  Administered 2020-04-02 – 2020-04-04 (×7): 100 mg via ORAL
  Filled 2020-04-02 (×7): qty 1

## 2020-04-02 MED ORDER — LORAZEPAM 1 MG PO TABS: 1.0000 mg | ORAL_TABLET | ORAL | Status: DC | PRN

## 2020-04-02 MED ORDER — HYDROCODONE-ACETAMINOPHEN 7.5-325 MG/15ML PO SOLN: 10.0000 mL | Freq: Four times a day (QID) | ORAL | Status: DC | PRN

## 2020-04-02 MED ORDER — LINACLOTIDE 145 MCG PO CAPS
145.0000 ug | ORAL_CAPSULE | Freq: Every day | ORAL | Status: DC
  Administered 2020-04-03: 145 ug via ORAL
  Filled 2020-04-02 (×4): qty 1

## 2020-04-02 MED ORDER — DICYCLOMINE HCL 20 MG PO TABS
20.0000 mg | ORAL_TABLET | Freq: Four times a day (QID) | ORAL | Status: DC | PRN
  Filled 2020-04-02: qty 1

## 2020-04-02 MED ORDER — ENSURE ENLIVE PO LIQD
237.0000 mL | Freq: Three times a day (TID) | ORAL | Status: DC
  Administered 2020-04-02 – 2020-04-04 (×4): 237 mL via ORAL

## 2020-04-02 MED ORDER — SODIUM CHLORIDE 0.9 % IV SOLN: INTRAVENOUS | Status: AC

## 2020-04-02 MED ORDER — ACETAMINOPHEN 650 MG RE SUPP: 650.0000 mg | Freq: Four times a day (QID) | RECTAL | Status: DC | PRN

## 2020-04-02 MED ORDER — PNEUMOCOCCAL VAC POLYVALENT 25 MCG/0.5ML IJ INJ: 0.5000 mL | INJECTION | INTRAMUSCULAR | Status: DC

## 2020-04-02 MED ORDER — SODIUM CHLORIDE 0.9 % IV BOLUS
500.0000 mL | Freq: Once | INTRAVENOUS | Status: AC
  Administered 2020-04-02: 500 mL via INTRAVENOUS

## 2020-04-02 MED ORDER — ACETAMINOPHEN 325 MG PO TABS: 650.0000 mg | ORAL_TABLET | Freq: Four times a day (QID) | ORAL | Status: DC | PRN

## 2020-04-02 NOTE — ED Triage Notes (Signed)
Pt brought to ED by nursing aid for slurred speech and right sided numbness started per patient yesterday. Aid last seen pt normal Monday around lunch.

## 2020-04-02 NOTE — H&P (Addendum)
History and Physical    [name]:[id number] DOB: December 08, 1955 DOA: 04/02/2020  PCP: Avon Gully, MD   Patient coming from: Home  Chief Complaint: "Not acting right"  HPI: [name] is a 64 y.o. 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  This was noted by her home health aide as well as her friend whom she lives with.  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  There were no signs of any seizure activity noted.  Additionally, there is no new noted facial droop, dysarthria, headache, visual disturbances, or any other findings that were noted.   ED Course: Stable vital signs noted with some pancytopenia noted on lab work.  Serum sodium level is 121.  Serum bicarbonate is 19.  Urine analysis negative for any acute findings.  CT of the head as well as CT angiogram with no acute findings.  Aneurysmal clips noted.  Patient responds well to questioning and appears alert and oriented x3.  She is able to respond with meaningful answers to questioning.  Review of Systems: All others reviewed and otherwise negative except as noted above.  Past Medical History:  Diagnosis Date  . Alcohol abuse   . Asthma   . Contracture of muscle of hand    right  . GERD (gastroesophageal reflux disease)   . HTN (hypertension)   . PUD (peptic ulcer disease)    remote  . Seizures (HCC)    since stroke, no recent seizures  . Stroke [facility])    age 89, required brain surgery    Past Surgical History:  Procedure Laterality Date  . ABDOMINAL HYSTERECTOMY  2004   complete  . BRAIN SURGERY     age 23, stroke  . COLONOSCOPY    . COLONOSCOPY WITH PROPOFOL  10/03/2012   Dr. Darrick Penna:  moderate diverticulosis, small internal hemorrhoids, TUBULAR ADENOMA. Next colonoscopy 2023 per Dr. Darrick Penna.  . ESOPHAGOGASTRODUODENOSCOPY N/A 10/18/2014   Dr. Jena Gauss during inpatient hospitalization: severe exudative/erosive reflux esophagitis as source of trivial upper GI bleed. No varices   . ESOPHAGOGASTRODUODENOSCOPY (EGD) WITH PROPOFOL  10/03/2012   Dr. Darrick Penna: stricture at GE junction s/p dilation, mild gastritis negative H.pylori   . ESOPHAGOGASTRODUODENOSCOPY (EGD) WITH PROPOFOL N/A 12/27/2017   Grade D esophagitis. Medium-sized hiatal hernia, normal duodenum  . right tib-fib fracture  2008  . SAVORY DILATION  10/03/2012   Procedure: SAVORY DILATION;  Surgeon: West Bali, MD;  Location: [facility];  Service: Endoscopy;  Laterality: N/A;  started at 1303,  dilated 12.8-16mm     reports that she quit smoking about 5 years ago. Her smoking use included cigarettes. She has a 0.36 pack-year smoking history. She has never used smokeless tobacco. She reports previous alcohol use of about 35.0 standard drinks of alcohol per week. She reports that she does not use drugs.  No Known Allergies  Family History  Problem Relation Age of Onset  . Liver disease Sister        etoh  . Colon cancer Neg Hx     Prior to Admission medications   Medication Sig Start Date End Date Taking? Authorizing Provider  acetaminophen (TYLENOL) 325 MG tablet Take 325-650 mg by  mouth every 6 (six) hours as needed.   Yes [provider]  dicyclomine (BENTYL) 20 MG tablet Take 1 tablet (20 mg total) by mouth every 6 (six) hours as needed for spasms. 10/23/19  Yes Raeford Razor, MD  ENSURE PLUS (ENSURE PLUS) LIQD Take 237 mLs by mouth 3 (three) times daily between meals.   Yes [provider]  folic acid (FOLVITE) 1 MG tablet Take 1 tablet (1 mg total) by mouth daily. 07/03/19  Yes Anice Paganini, NP  HYDROcodone-acetaminophen (HYCET) 7.5-325 mg/15 ml solution TAKE BY MOUTH 4 TIMES DAILY AS NEEDED.  11/15/19  Yes Wynne Dust A, NP  ipratropium-albuterol (DUONEB) 0.5-2.5 (3) MG/3ML SOLN Take 3 mLs by nebulization every 6 (six) hours as needed (for shortness of breath/wheezing).    Yes [provider]  linaclotide (LINZESS) 145 MCG CAPS capsule Take 145 mcg by mouth daily before breakfast.   Yes [provider]  lisinopril (ZESTRIL) 40 MG tablet Take 1 tablet (40 mg total) by mouth daily. 10/09/19  Yes Shon Hale, MD  Multiple Vitamin (MULTIVITAMIN WITH MINERALS) TABS tablet Take 1 tablet by mouth daily. 04/14/19  Yes Johnson, Clanford L, MD  naproxen (NAPROSYN) 500 MG tablet TAKE (1) TABLET BY MOUTH TWICE DAILY WITH A MEAL. 01/31/20  Yes Darreld Mclean, MD  ondansetron (ZOFRAN) 4 MG tablet Take 1 tablet (4 mg total) by mouth every 8 (eight) hours as needed for nausea or vomiting. 11/05/19  Yes Gosrani, Nimish C, MD  pantoprazole (PROTONIX) 40 MG tablet Take 1 tablet (40 mg total) by mouth 2 (two) times daily before a meal. 11/05/19  Yes Gosrani, Nimish C, MD  PHENobarbital (LUMINAL) 32.4 MG tablet Take 32.4 mg by mouth 2 (two) times daily. 07/13/19  Yes [provider]  phenytoin (DILANTIN) 100 MG ER capsule Take 100 mg by mouth 3 (three) times daily.   Yes [provider]  solifenacin (VESICARE) 5 MG tablet Take 5 mg by mouth daily. 03/11/20  Yes [provider]  sucralfate (CARAFATE) 1 GM/10ML suspension Take 10 mLs (1 g total) by mouth 4 (four) times daily -  with meals and at bedtime. 11/05/19  Yes Gosrani, Nimish C, MD  thiamine 100 MG tablet Take 1 tablet (100 mg total) by mouth daily. 04/14/19  Yes Johnson, Clanford L, MD  albuterol (PROVENTIL HFA;VENTOLIN HFA) 108 (90 BASE) MCG/ACT inhaler Inhale 2 puffs into the lungs every 6 (six) hours as needed for wheezing or shortness of breath.     [provider]  albuterol (PROVENTIL) (2.5 MG/3ML) 0.083% nebulizer solution Take 2.5 mg by nebulization every 6 (six) hours as needed for wheezing or  shortness of breath. Patient not taking: Reported on 04/02/2020    [provider]    Physical Exam: Vitals:   04/02/20 0906 04/02/20 1038 04/02/20 1100 04/02/20 1320  BP: 138/66 133/67 (!) 133/57 131/68  Pulse: 81 86 82 81  Resp: 12 17 15 13   Temp: 98.2 F (36.8 C)     SpO2: 99% 100% 100% 100%  Weight:      Height:        Constitutional: NAD, calm, comfortable Vitals:   04/02/20 0906 04/02/20 1038 04/02/20 1100 04/02/20 1320  BP: 138/66 133/67 (!) 133/57 131/68  Pulse: 81 86 82 81  Resp: 12 17 15 13   Temp: 98.2 F (36.8 C)     SpO2: 99% 100% 100% 100%  Weight:      Height:       Eyes:  lids and conjunctivae normal ENMT: Mucous membranes are moist.  Neck: normal, supple Respiratory: clear to auscultation bilaterally. Normal respiratory effort. No accessory muscle use.  Cardiovascular: Regular rate and rhythm, no murmurs. No extremity edema. Abdomen: no tenderness, no distention. Bowel sounds positive.  Musculoskeletal: Right-sided upper extremity contracture. Skin: no rashes, lesions, ulcers.  Psychiatric: Normal judgment and insight. Alert and oriented x 3. Normal mood.  Neurologic: Cranial nerves II through XII grossly intact.  No focal deficits identified on lower extremity testing.  Left upper extremity with strength intact.  Contracture to the right upper extremity noted.  Labs on Admission: I have personally reviewed following labs and imaging studies  CBC: Recent Labs  Lab 04/02/20 1031  WBC 3.0*  NEUTROABS 1.6*  HGB 10.5*  HCT 31.8*  MCV 83.5  PLT 101*   Basic Metabolic Panel: Recent Labs  Lab 04/02/20 1031  NA 121*  K 4.4  CL 89*  CO2 19*  GLUCOSE 97  BUN 10  CREATININE 0.86  CALCIUM 9.7   GFR: Estimated Creatinine Clearance: 66.3 mL/min (by C-G formula based on SCr of 0.86 mg/dL). Liver Function Tests: Recent Labs  Lab 04/02/20 1031  AST 67*  ALT 37  ALKPHOS 93  BILITOT 0.8  PROT 9.0*  ALBUMIN 4.4   No results for  input(s): LIPASE, AMYLASE in the last 168 hours. No results for input(s): AMMONIA in the last 168 hours. Coagulation Profile: No results for input(s): INR, PROTIME in the last 168 hours. Cardiac Enzymes: No results for input(s): CKTOTAL, CKMB, CKMBINDEX, TROPONINI in the last 168 hours. BNP (last 3 results) No results for input(s): PROBNP in the last 8760 hours. HbA1C: No results for input(s): HGBA1C in the last 72 hours. CBG: No results for input(s): GLUCAP in the last 168 hours. Lipid Profile: No results for input(s): CHOL, HDL, LDLCALC, TRIG, CHOLHDL, LDLDIRECT in the last 72 hours. Thyroid Function Tests: No results for input(s): TSH, T4TOTAL, FREET4, T3FREE, THYROIDAB in the last 72 hours. Anemia Panel: No results for input(s): VITAMINB12, FOLATE, FERRITIN, TIBC, IRON, RETICCTPCT in the last 72 hours. Urine analysis:    Component Value Date/Time   COLORURINE YELLOW 04/02/2020 1049   APPEARANCEUR CLEAR 04/02/2020 1049   LABSPEC 1.003 (L) 04/02/2020 1049   PHURINE 5.0 04/02/2020 1049   GLUCOSEU NEGATIVE 04/02/2020 1049   HGBUR NEGATIVE 04/02/2020 1049   BILIRUBINUR NEGATIVE 04/02/2020 1049   KETONESUR NEGATIVE 04/02/2020 1049   PROTEINUR NEGATIVE 04/02/2020 1049   UROBILINOGEN 0.2 12/26/2014 1927   NITRITE NEGATIVE 04/02/2020 1049   LEUKOCYTESUR NEGATIVE 04/02/2020 1049    Radiological Exams on Admission: CT Angio Head W or Wo Contrast  Result Date: 04/02/2020 CLINICAL DATA:  Right-sided weakness and slurred speech. EXAM: CT ANGIOGRAPHY HEAD AND NECK TECHNIQUE: Multidetector CT imaging of the head and neck was performed using the standard protocol during bolus administration of intravenous contrast. Multiplanar CT image reconstructions and MIPs were obtained to evaluate the vascular anatomy. Carotid stenosis measurements (when applicable) are obtained utilizing NASCET criteria, using the distal internal carotid diameter as the denominator. CONTRAST:  75mL OMNIPAQUE IOHEXOL  350 MG/ML SOLN COMPARISON:  CT head 04/02/2020 FINDINGS: CTA NECK FINDINGS Aortic arch: Standard branching. Imaged portion shows no evidence of aneurysm or dissection. No significant stenosis of the major arch vessel origins. Mild atherosclerotic calcification in the aortic arch. Right carotid system: Right carotid widely patent without stenosis or atherosclerotic disease Left carotid system: Left carotid widely patent. Minimal atherosclerotic calcification left carotid bifurcation Vertebral arteries: Left vertebral  artery dominant. Both vertebral arteries patent to the basilar without stenosis. Skeleton: Cervical spondylosis.  No acute skeletal abnormality. Other neck: No soft tissue mass or adenopathy in the neck. Upper chest: Lung apices clear bilaterally. Review of the MIP images confirms the above findings CTA HEAD FINDINGS Anterior circulation: Internal carotid artery widely patent bilaterally without significant atherosclerotic disease or stenosis. 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 This does not fill with contrast. There is a surgical clip lateral to the aneurysm. Anterior and middle cerebral arteries patent without stenosis or occlusion. No other aneurysm identified. Posterior circulation: Both vertebral arteries patent to the basilar. Right PICA patent. Left PICA not visualized. Left AICA is a prominent vessel and patent. Superior cerebellar and posterior cerebral arteries patent bilaterally without significant stenosis. Venous sinuses: Normal venous enhancement Anatomic variants: None Review of the MIP images confirms the above findings IMPRESSION: 1. No  intracranial occlusion or significant stenosis 2. 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 This is not filled with contrast. There is an aneurysm clip adjacent and lateral to the aneurysm. Otherwise no aneurysm is identified. 3. No significant carotid or vertebral artery stenosis in the  neck. Electronically Signed   By: Marlan Palau M.D.   On: 04/02/2020 12:46   CT Head Wo Contrast  Result Date: 04/02/2020 CLINICAL DATA:  Slurred speech and right-sided numbness EXAM: CT HEAD WITHOUT CONTRAST TECHNIQUE: Contiguous axial images were obtained from the base of the skull through the vertex without intravenous contrast. COMPARISON:  07/30/2016 FINDINGS: Brain: Bilateral inferior frontal and left superior frontal encephalomalacia with ex vacuo enlargement. There has been aneurysm clipping at the right circle-of-Willis with a calcified 10 mm mass likely reflecting calcified aneurysm. No evidence of acute infarct, acute hemorrhage, hydrocephalus, or shift. Vascular: Negative Skull: Remote right frontal craniotomy Sinuses/Orbits: Negative IMPRESSION: 1. No acute finding. 2. 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 3. Prior aneurysm clipping. Electronically Signed   By: Marnee Spring M.D.   On: 04/02/2020 09:59   CT Angio Neck W and/or Wo Contrast  Result Date: 04/02/2020 CLINICAL DATA:  Right-sided weakness and slurred speech. EXAM: CT ANGIOGRAPHY HEAD AND NECK TECHNIQUE: Multidetector CT imaging of the head and neck was performed using the standard protocol during bolus administration of intravenous contrast. Multiplanar CT image reconstructions and MIPs were obtained to evaluate the vascular anatomy. Carotid stenosis measurements (when applicable) are obtained utilizing NASCET criteria, using the distal internal carotid diameter as the denominator. CONTRAST:  75mL OMNIPAQUE IOHEXOL 350 MG/ML SOLN COMPARISON:  CT head 04/02/2020 FINDINGS: CTA NECK FINDINGS Aortic arch: Standard branching. Imaged portion shows no evidence of aneurysm or dissection. No significant stenosis of the major arch vessel origins. Mild atherosclerotic calcification in the aortic arch. Right carotid system: Right carotid widely patent without stenosis or atherosclerotic disease Left carotid system: Left carotid  widely patent. Minimal atherosclerotic calcification left carotid bifurcation Vertebral arteries: Left vertebral artery dominant. Both vertebral arteries patent to the basilar without stenosis. Skeleton: Cervical spondylosis.  No acute skeletal abnormality. Other neck: No soft tissue mass or adenopathy in the neck. Upper chest: Lung apices clear bilaterally. Review of the MIP images confirms the above findings CTA HEAD FINDINGS Anterior circulation: Internal carotid artery widely patent bilaterally without significant atherosclerotic disease or stenosis. 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 This does not fill with contrast. There is a surgical clip lateral to the aneurysm. Anterior and middle cerebral arteries patent without  stenosis or occlusion. No other aneurysm identified. Posterior circulation: Both vertebral arteries patent to the basilar. Right PICA patent. Left PICA not visualized. Left AICA is a prominent vessel and patent. Superior cerebellar and posterior cerebral arteries patent bilaterally without significant stenosis. Venous sinuses: Normal venous enhancement Anatomic variants: None Review of the MIP images confirms the above findings IMPRESSION: 1. No  intracranial occlusion or significant stenosis 2. 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 This is not filled with contrast. There is an aneurysm clip adjacent and lateral to the aneurysm. Otherwise no aneurysm is identified. 3. No significant carotid or vertebral artery stenosis in the neck. Electronically Signed   By: Marlan Palau M.D.   On: 04/02/2020 12:46    EKG: Independently reviewed. 84bpm. SR.  Assessment/Plan Active Problems:   Acute metabolic encephalopathy    Acute metabolic encephalopathy secondary to moderate hyponatremia -This is likely secondary to beer potomania as it is described that she drinks approximately 2-3 beers per day -Appears euvolemic -Check urine  and serum osmolarity as well as urine sodium.  Check TSH. -Normal saline bolus given in ED and will maintain on gentle normal saline x8 hours for now -Follow BMP every 4 hours with goal rate of correction around 6-8 mEq/24 hours -Maintain on fluid restriction  Chronic alcohol use -Maintain on CIWA precautions -Blood alcohol level negative -Reportedly drinks 2-3 cans of beer daily  Pancytopenia -Likely secondary to chronic alcohol abuse -Continue to monitor repeat CBC -Avoid heparin agents  History of prior CVA with aneurysmal clipping and associated seizures -CT imaging with no acute findings -MRI cannot be performed due to aneurysmal clip -Continue home antiepileptics of phenobarbital and phenytoin  History of GERD -Continue PPI  History of hypertension-controlled -Continue lisinopril   DVT prophylaxis: SCDs Code Status: Full code Family Communication: Discussed with Friend on phone, Gillis Ends Disposition Plan:Admit for evaluation and treatment of hyponatremia Consults called:None Admission status: Inpatient, tele Status is: Inpatient  Remains inpatient appropriate because:Unsafe d/c plan, IV treatments appropriate due to intensity of illness or inability to take PO and Inpatient level of care appropriate due to severity of illness   Dispo: The patient is from: Home              Anticipated d/c is to: Home              Anticipated d/c date is: 2 days              Patient currently is not medically stable to d/c.  Emiliano Welshans D Sherryll Burger DO [facility]  If 7PM-7AM, please contact night-coverage [email or web address]  04/02/2020, 2:15 PM

## 2020-04-02 NOTE — Progress Notes (Signed)
[name] is a 64 y.o. female patient admitted from ED awake, alert - oriented  X 4 - no acute distress noted.  VSS - Blood pressure (!) 120/57, pulse 93, temperature 98.2 F (36.8 C), resp. rate 19, height 5\' 4"  (1.626 m), weight 74.8 kg, SpO2 100 %.    IV in place, occlusive dsg intact without redness.  Orientation to room, and floor completed with information packet given to patient/family.  Patient declined safety video at this time.  Admission INP armband ID verified with patient/family, and in place.   SR up x 2, fall assessment complete, with patient and family able to verbalize understanding of risk associated with falls, and verbalized understanding to call nsg before up out of bed.  Call light within reach, patient able to voice, and demonstrate understanding.   Will cont to eval and treat per MD orders.  Sheral Flow, RN 04/02/2020 6:59 PM

## 2020-04-02 NOTE — ED Provider Notes (Signed)
[facility] EMERGENCY DEPARTMENT Provider Note   CSN: [id number] Arrival date & time: 04/02/20  0856     History Chief Complaint  Patient presents with  . Numbness    [name] is a 64 y.o. female.  Patient with history of stroke and right-sided deficits, alcohol abuse, high blood pressure presents with worsening right-sided numbness and weakness along with slurred speech for the past 2 days.  Nurses aide brought the patient in for assessment last seen normal on Monday.  Patient feels worse than her baseline but same areas.  Patient denies blood thinner use.  No fevers or infectious symptoms.        Past Medical History:  Diagnosis Date  . Alcohol abuse   . Asthma   . Contracture of muscle of hand    right  . GERD (gastroesophageal reflux disease)   . HTN (hypertension)   . PUD (peptic ulcer disease)    remote  . Seizures ([place])    since stroke, no recent seizures  . Stroke [facility])    age 60, required brain surgery    Patient Active Problem List   Diagnosis Date Noted  . Acute blood loss anemia 11/02/2019  . Overweight (BMI 25.0-29.9) 11/02/2019  . Gait disturbance   . Rectal bleeding 10/08/2019  . Hyponatremia   . Diarrhea 04/11/2019  . Nausea, vomiting, and diarrhea   . ARF (acute renal failure) ([place]) 04/10/2019  . Acute kidney injury ([place]) 01/22/2019  . History of stroke 01/22/2019  . Asthma 01/22/2019  . Gastroenteritis 01/22/2019  . Stroke ([place]) 06/14/2018  . Cirrhosis of liver without ascites ([place]) 04/21/2017  . AKI (acute kidney injury) ([place]) 07/30/2016  . Anemia 11/25/2015  . Abnormal liver function   . Coffee ground emesis   . Reflux esophagitis   . Sinus tachycardia ([place]) 10/10/2014  . Hypokalemia 10/10/2014  . Seizure disorder ([place]) 10/10/2014  . Alcohol abuse 09/05/2012  . Odynophagia 09/05/2012  . GERD (gastroesophageal reflux disease) 09/05/2012  . Abdominal pain 09/05/2012  . FX CLOSED TIBIA NOS 07/27/2007  . STROKE-With also h/o  Aneurysm clipping? 07/25/2007  . Seizures ([place]) 07/25/2007    Past Surgical History:  Procedure Laterality Date  . ABDOMINAL HYSTERECTOMY  2004   complete  . BRAIN SURGERY     age 77, stroke  . COLONOSCOPY    . COLONOSCOPY WITH PROPOFOL  10/03/2012   Dr. [name]: moderate diverticulosis, small internal hemorrhoids, TUBULAR ADENOMA. Next colonoscopy 2023 per Dr. [name].  . ESOPHAGOGASTRODUODENOSCOPY N/A 10/18/2014   Dr. Gala Romney during inpatient hospitalization: severe exudative/erosive reflux esophagitis as source of trivial upper GI bleed. No varices   . ESOPHAGOGASTRODUODENOSCOPY (EGD) WITH PROPOFOL  10/03/2012   Dr. [name]: stricture at [place] junction s/p dilation, mild gastritis negative H.pylori   . ESOPHAGOGASTRODUODENOSCOPY (EGD) WITH PROPOFOL N/A 12/27/2017   Grade D esophagitis. Medium-sized hiatal hernia, normal duodenum  . right tib-fib fracture  2008  . SAVORY DILATION  10/03/2012   Procedure: SAVORY DILATION;  Surgeon: Danie Binder, MD;  Location: [facility];  Service: Endoscopy;  Laterality: N/A;  started at 1303,  dilated 12.8-16mm     OB History    Gravida  4   Para  3   Term  3   Preterm      AB  1   Living  3     SAB  1   TAB      Ectopic      Multiple      Live  Births              Family History  Problem Relation Age of Onset  . Liver disease Sister        etoh  . Colon cancer Neg Hx     Social History   Tobacco Use  . Smoking status: Former Smoker    Packs/day: 0.04    Years: 9.00    Pack years: 0.36    Types: Cigarettes    Quit date: 07/28/2014    Years since quitting: 5.6  . Smokeless tobacco: Never Used  Vaping Use  . Vaping Use: Never used  Substance Use Topics  . Alcohol use: Not Currently    Alcohol/week: 35.0 standard drinks    Types: 35 Cans of beer per week    Comment: 12 pack of beer twice per week; denied 11/01/19  . Drug use: No    Home Medications Prior to Admission medications   Medication Sig Start Date End Date  Taking? Authorizing Provider  acetaminophen (TYLENOL) 325 MG tablet Take 325-650 mg by mouth every 6 (six) hours as needed.   Yes [provider]  dicyclomine (BENTYL) 20 MG tablet Take 1 tablet (20 mg total) by mouth every 6 (six) hours as needed for spasms. 10/23/19  Yes Virgel Manifold, MD  ENSURE PLUS (ENSURE PLUS) LIQD Take 237 mLs by mouth 3 (three) times daily between meals.   Yes [provider]  folic acid (FOLVITE) 1 MG tablet Take 1 tablet (1 mg total) by mouth daily. 07/03/19  Yes Carlis Stable, NP  HYDROcodone-acetaminophen (HYCET) 7.5-325 mg/15 ml solution TAKE 10MLS BY MOUTH 4 TIMES DAILY AS NEEDED. 11/15/19  Yes Walden Field A, NP  ipratropium-albuterol (DUONEB) 0.5-2.5 (3) MG/3ML SOLN Take 3 mLs by nebulization every 6 (six) hours as needed (for shortness of breath/wheezing).    Yes [provider]  linaclotide (LINZESS) 145 MCG CAPS capsule Take 145 mcg by mouth daily before breakfast.   Yes [provider]  lisinopril (ZESTRIL) 40 MG tablet Take 1 tablet (40 mg total) by mouth daily. 10/09/19  Yes Roxan Hockey, MD  Multiple Vitamin (MULTIVITAMIN WITH MINERALS) TABS tablet Take 1 tablet by mouth daily. 04/14/19  Yes Johnson, Clanford L, MD  naproxen (NAPROSYN) 500 MG tablet TAKE (1) TABLET BY MOUTH TWICE DAILY WITH A MEAL. 01/31/20  Yes Sanjuana Kava, MD  ondansetron (ZOFRAN) 4 MG tablet Take 1 tablet (4 mg total) by mouth every 8 (eight) hours as needed for nausea or vomiting. 11/05/19  Yes Gosrani, Nimish C, MD  pantoprazole (PROTONIX) 40 MG tablet Take 1 tablet (40 mg total) by mouth 2 (two) times daily before a meal. 11/05/19  Yes Gosrani, Nimish C, MD  PHENobarbital (LUMINAL) 32.4 MG tablet Take 32.4 mg by mouth 2 (two) times daily. 07/13/19  Yes [provider]  phenytoin (DILANTIN) 100 MG ER capsule Take 100 mg by mouth 3 (three) times daily.   Yes [provider]  solifenacin (VESICARE) 5 MG tablet Take 5 mg by mouth daily.  03/11/20  Yes [provider]  sucralfate (CARAFATE) 1 GM/10ML suspension Take 10 mLs (1 g total) by mouth 4 (four) times daily -  with meals and at bedtime. 11/05/19  Yes Gosrani, Nimish C, MD  thiamine 100 MG tablet Take 1 tablet (100 mg total) by mouth daily. 04/14/19  Yes Johnson, Clanford L, MD  albuterol (PROVENTIL HFA;VENTOLIN HFA) 108 (90 BASE) MCG/ACT inhaler Inhale 2 puffs into the lungs every 6 (six) hours  as needed for wheezing or shortness of breath.     [provider]  albuterol (PROVENTIL) (2.5 MG/3ML) 0.083% nebulizer solution Take 2.5 mg by nebulization every 6 (six) hours as needed for wheezing or shortness of breath. Patient not taking: Reported on 04/02/2020    [provider]    Allergies    Patient has no known allergies.  Review of Systems   Review of Systems  Constitutional: Negative for chills and fever.  HENT: Negative for congestion.   Eyes: Negative for visual disturbance.  Respiratory: Negative for shortness of breath.   Cardiovascular: Negative for chest pain.  Gastrointestinal: Negative for abdominal pain and vomiting.  Genitourinary: Negative for dysuria and flank pain.  Musculoskeletal: Negative for back pain, neck pain and neck stiffness.  Skin: Negative for rash.  Neurological: Positive for weakness and numbness. Negative for light-headedness and headaches.    Physical Exam Updated Vital Signs BP 131/68   Pulse 81   Temp 98.2 F (36.8 C)   Resp 13   Ht 5\' 4"  (1.626 m)   Wt 74.8 kg   SpO2 100%   BMI 28.32 kg/m   Physical Exam Vitals and nursing note reviewed.  Constitutional:      Appearance: She is well-developed.  HENT:     Head: Normocephalic and atraumatic.  Eyes:     General:        Right eye: No discharge.        Left eye: No discharge.     Conjunctiva/sclera: Conjunctivae normal.  Neck:     Trachea: No tracheal deviation.  Cardiovascular:     Rate and Rhythm: Normal rate.  Pulmonary:     Effort:  Pulmonary effort is normal.  Abdominal:     General: There is no distension.     Palpations: Abdomen is soft.     Tenderness: There is no abdominal tenderness. There is no guarding.  Musculoskeletal:     Cervical back: Normal range of motion and neck supple.  Skin:    General: Skin is warm.     Findings: No rash.  Neurological:     Mental Status: She is alert. She is disoriented.     Comments: Patient has contracture right upper extremity with weakness, weakness right lower extremity as well, normal strength sensation left upper and lower extremities.  Numbness right extremities.  Mild dysarthria.  Extraocular muscle function intact with pupils equal to light.  No facial droop. Mild confusion  Psychiatric:        Mood and Affect: Affect is flat.     ED Results / Procedures / Treatments   Labs (all labs ordered are listed, but only abnormal results are displayed) Labs Reviewed  COMPREHENSIVE METABOLIC PANEL - Abnormal; Notable for the following components:      Result Value   Sodium 121 (*)    Chloride 89 (*)    CO2 19 (*)    Total Protein 9.0 (*)    AST 67 (*)    All other components within normal limits  CBC WITH DIFFERENTIAL/PLATELET - Abnormal; Notable for the following components:   WBC 3.0 (*)    RBC 3.81 (*)    Hemoglobin 10.5 (*)    HCT 31.8 (*)    RDW 16.3 (*)    Platelets 101 (*)    Neutro Abs 1.6 (*)    All other components within normal limits  URINALYSIS, ROUTINE W REFLEX MICROSCOPIC - Abnormal; Notable for the following components:   Specific Gravity,  Urine 1.003 (*)    All other components within normal limits  ETHANOL    EKG None  Radiology CT Angio Head W or Wo Contrast  Result Date: 04/02/2020 CLINICAL DATA:  Right-sided weakness and slurred speech. EXAM: CT ANGIOGRAPHY HEAD AND NECK TECHNIQUE: Multidetector CT imaging of the head and neck was performed using the standard protocol during bolus administration of intravenous contrast. Multiplanar CT  image reconstructions and MIPs were obtained to evaluate the vascular anatomy. Carotid stenosis measurements (when applicable) are obtained utilizing NASCET criteria, using the distal internal carotid diameter as the denominator. CONTRAST:  65mL OMNIPAQUE IOHEXOL 350 MG/ML SOLN COMPARISON:  CT head 04/02/2020 FINDINGS: CTA NECK FINDINGS Aortic arch: Standard branching. Imaged portion shows no evidence of aneurysm or dissection. No significant stenosis of the major arch vessel origins. Mild atherosclerotic calcification in the aortic arch. Right carotid system: Right carotid widely patent without stenosis or atherosclerotic disease Left carotid system: Left carotid widely patent. Minimal atherosclerotic calcification left carotid bifurcation Vertebral arteries: Left vertebral artery dominant. Both vertebral arteries patent to the basilar without stenosis. Skeleton: Cervical spondylosis.  No acute skeletal abnormality. Other neck: No soft tissue mass or adenopathy in the neck. Upper chest: Lung apices clear bilaterally. Review of the MIP images confirms the above findings CTA HEAD FINDINGS Anterior circulation: Internal carotid artery widely patent bilaterally without significant atherosclerotic disease or stenosis. 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 This does not fill with contrast. There is a surgical clip lateral to the aneurysm. Anterior and middle cerebral arteries patent without stenosis or occlusion. No other aneurysm identified. Posterior circulation: Both vertebral arteries patent to the basilar. Right PICA patent. Left PICA not visualized. Left AICA is a prominent vessel and patent. Superior cerebellar and posterior cerebral arteries patent bilaterally without significant stenosis. Venous sinuses: Normal venous enhancement Anatomic variants: None Review of the MIP images confirms the above findings IMPRESSION: 1. No  intracranial occlusion or significant  stenosis 2. 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 This is not filled with contrast. There is an aneurysm clip adjacent and lateral to the aneurysm. Otherwise no aneurysm is identified. 3. No significant carotid or vertebral artery stenosis in the neck. Electronically Signed   By: Franchot Gallo M.D.   On: 04/02/2020 12:46   CT Head Wo Contrast  Result Date: 04/02/2020 CLINICAL DATA:  Slurred speech and right-sided numbness EXAM: CT HEAD WITHOUT CONTRAST TECHNIQUE: Contiguous axial images were obtained from the base of the skull through the vertex without intravenous contrast. COMPARISON:  07/30/2016 FINDINGS: Brain: Bilateral inferior frontal and left superior frontal encephalomalacia with ex vacuo enlargement. There has been aneurysm clipping at the right circle-of-Willis with a calcified 10 mm mass likely reflecting calcified aneurysm. No evidence of acute infarct, acute hemorrhage, hydrocephalus, or shift. Vascular: Negative Skull: Remote right frontal craniotomy Sinuses/Orbits: Negative IMPRESSION: 1. No acute finding. 2. 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 3. Prior aneurysm clipping. Electronically Signed   By: Monte Fantasia M.D.   On: 04/02/2020 09:59   CT Angio Neck W and/or Wo Contrast  Result Date: 04/02/2020 CLINICAL DATA:  Right-sided weakness and slurred speech. EXAM: CT ANGIOGRAPHY HEAD AND NECK TECHNIQUE: Multidetector CT imaging of the head and neck was performed using the standard protocol during bolus administration of intravenous contrast. Multiplanar CT image reconstructions and MIPs were obtained to evaluate the vascular anatomy. Carotid stenosis measurements (when applicable) are obtained utilizing NASCET criteria, using the distal internal carotid diameter  as the denominator. CONTRAST:  45mL OMNIPAQUE IOHEXOL 350 MG/ML SOLN COMPARISON:  CT head 04/02/2020 FINDINGS: CTA NECK FINDINGS Aortic arch: Standard branching. Imaged portion shows no evidence of  aneurysm or dissection. No significant stenosis of the major arch vessel origins. Mild atherosclerotic calcification in the aortic arch. Right carotid system: Right carotid widely patent without stenosis or atherosclerotic disease Left carotid system: Left carotid widely patent. Minimal atherosclerotic calcification left carotid bifurcation Vertebral arteries: Left vertebral artery dominant. Both vertebral arteries patent to the basilar without stenosis. Skeleton: Cervical spondylosis.  No acute skeletal abnormality. Other neck: No soft tissue mass or adenopathy in the neck. Upper chest: Lung apices clear bilaterally. Review of the MIP images confirms the above findings CTA HEAD FINDINGS Anterior circulation: Internal carotid artery widely patent bilaterally without significant atherosclerotic disease or stenosis. 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 This does not fill with contrast. There is a surgical clip lateral to the aneurysm. Anterior and middle cerebral arteries patent without stenosis or occlusion. No other aneurysm identified. Posterior circulation: Both vertebral arteries patent to the basilar. Right PICA patent. Left PICA not visualized. Left AICA is a prominent vessel and patent. Superior cerebellar and posterior cerebral arteries patent bilaterally without significant stenosis. Venous sinuses: Normal venous enhancement Anatomic variants: None Review of the MIP images confirms the above findings IMPRESSION: 1. No  intracranial occlusion or significant stenosis 2. 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 This is not filled with contrast. There is an aneurysm clip adjacent and lateral to the aneurysm. Otherwise no aneurysm is identified. 3. No significant carotid or vertebral artery stenosis in the neck. Electronically Signed   By: Franchot Gallo M.D.   On: 04/02/2020 12:46    Procedures Procedures (including critical care  time)  Medications Ordered in ED Medications  iohexol (OMNIPAQUE) 350 MG/ML injection 75 mL (75 mLs Intravenous Contrast Given 04/02/20 1150)  sodium chloride 0.9 % bolus 500 mL (500 mLs Intravenous New Bag/Given 04/02/20 1321)    ED Course  I have reviewed the triage vital signs and the nursing notes.  Pertinent labs & imaging results that were available during my care of the patient were reviewed by me and considered in my medical decision making (see chart for details).    MDM Rules/Calculators/A&P                          Patient with history of stroke with right-sided deficits years ago presents with worsening signs the past day and a half.  Plan for blood work to check for anemia, electrolytes, alcohol level and CT scan of the head to look for any bleeding.  MRI ordered to look for any new stroke.  Patient out of any time window for TPA consideration.  CT scan no acute bleeding.  Unable to get MRI due to clips.  Difficult IV ultrasound-guided by myself.  CT angiogram showed small aneurysm with clips but no bleeding or no new stroke.  Patient mild confusion reassessment.  His sodium returned 121 which is a new change last one was 140. No seizure activity, 500 cc normal saline ordered. Urinalysis no sign of infection.  White blood cell count 3, hemoglobin 10.  Ethanol normal.  Ammonia will be added.  Discussed with hospitalist for admission.   Final Clinical Impression(s) / ED Diagnoses Final diagnoses:  Right sided weakness  Hyponatremia  Confusion    Rx / DC Orders ED  Discharge Orders    None       Elnora Morrison, MD 04/02/20 1325

## 2020-04-02 NOTE — ED Notes (Signed)
Not able to obtain IV site.

## 2020-04-02 NOTE — TOC Initial Note (Signed)
Transition of Care [facility]) - Initial/Assessment Note   Patient Details  Name: [name] MRN: [id number] Date of Birth: [birth year]  [facility] CM/SW Contact:    Sherie Don, LCSW Phone Number: 04/02/2020, 8:34 PM  Clinical Narrative: Patient is a 64 year old female who was admitted for acute metabolic encephalopathy. TOC received consults for SA and HH/DME. CSW met with patient to discuss SA resources. CSW provided OP SA provider list. CSW completed initial assessment. Patient reported she lives at home with a friend and currently has a wheelchair, walker, shower chair, and BSC at home. CSW explained TOC will follow up if any additional services or referrals are needed.    Expected Discharge Plan: [place] Barriers to Discharge: Continued Medical Work up  Patient Goals and CMS Choice Patient states their goals for this hospitalization and ongoing recovery are:: Return home CMS Medicare.gov Compare Post Acute Care list provided to:: Patient Choice offered to / list presented to : Patient  Expected Discharge Plan and Services Expected Discharge Plan: [place] In-house Referral: Clinical Social Work Post Acute Care Choice: [place] arrangements for the past 2 months: Mobile Home  Prior Living Arrangements/Services Living arrangements for the past 2 months: Mobile Home Lives with:: Friends Patient language and need for interpreter reviewed:: Yes Do you feel safe going back to the place where you live?: Yes      Need for Family Participation in Patient Care: No (Comment) Care giver support system in place?: Yes (comment) Rosario Adie (friend) [place]: [phone]) Current home services: DME (Wheelchair, walker, shower chair, BSC) Criminal Activity/Legal Involvement Pertinent to Current Situation/Hospitalization: No - Comment as needed  Activities of Daily Living Home Assistive Devices/Equipment: Shower chair with  back ADL Screening (condition at time of admission) Patient's cognitive ability adequate to safely complete daily activities?: Yes Is the patient deaf or have difficulty hearing?: No Does the patient have difficulty seeing, even when wearing glasses/contacts?: No Does the patient have difficulty concentrating, remembering, or making decisions?: No Patient able to express need for assistance with ADLs?: Yes Does the patient have difficulty dressing or bathing?: Yes Independently performs ADLs?: No Communication: Independent Dressing (OT): Needs assistance Is this a change from baseline?: Pre-admission baseline Grooming: Needs assistance Is this a change from baseline?: Pre-admission baseline Feeding: Independent Bathing: Needs assistance Is this a change from baseline?: Pre-admission baseline Toileting: Independent In/Out Bed: Independent Walks in Home: Independent, Independent with device (comment) Does the patient have difficulty walking or climbing stairs?: Yes Weakness of Legs: Right Weakness of Arms/Hands: Right  Emotional Assessment Appearance:: Appears stated age Attitude/Demeanor/Rapport: Engaged Affect (typically observed): Appropriate, Accepting Orientation: : Oriented to Self, Oriented to Place, Oriented to  Time, Oriented to Situation Alcohol / Substance Use: Alcohol Use Psych Involvement: No (comment)  Admission diagnosis:  Confusion [R41.0] Hyponatremia [E87.1] Right sided weakness [V37.1] Acute metabolic encephalopathy [G62.69] Patient Active Problem List   Diagnosis Date Noted  . Acute metabolic encephalopathy 48/54/6270  . Acute blood loss anemia 11/02/2019  . Overweight (BMI 25.0-29.9) 11/02/2019  . Gait disturbance   . Rectal bleeding 10/08/2019  . Hyponatremia   . Diarrhea 04/11/2019  . Nausea, vomiting, and diarrhea   . ARF (acute renal failure) ([place]) 04/10/2019  . Acute kidney injury ([place]) 01/22/2019  . History of stroke 01/22/2019  . Asthma  01/22/2019  . Gastroenteritis 01/22/2019  . Stroke ([place]) 06/14/2018  . Cirrhosis of liver without ascites ([place]) 04/21/2017  .  AKI (acute kidney injury) ([place]) 07/30/2016  . Anemia 11/25/2015  . Abnormal liver function   . Coffee ground emesis   . Reflux esophagitis   . Sinus tachycardia ([place]) 10/10/2014  . Hypokalemia 10/10/2014  . Seizure disorder ([place]) 10/10/2014  . Alcohol abuse 09/05/2012  . Odynophagia 09/05/2012  . GERD (gastroesophageal reflux disease) 09/05/2012  . Abdominal pain 09/05/2012  . FX CLOSED TIBIA NOS 07/27/2007  . STROKE-With also h/o Aneurysm clipping? 07/25/2007  . Seizures ([place]) 07/25/2007   PCP:  [name], MD Pharmacy:   [facility] [address] [place] [address] Phone: [phone] Fax: [phone]  Readmission Risk Interventions Readmission Risk Prevention Plan 11/05/2019 04/13/2019  Transportation Screening Complete Complete  PCP or Specialist Appt within 5-7 Days - Complete  PCP or Specialist Appt within 3-5 Days Complete -  Home Care Screening - Complete  Medication Review (RN CM) - Complete  HRI or Home Care Consult Complete -  Social Work Consult for Recovery Care Planning/Counseling Complete -  Palliative Care Screening Not Applicable -  Medication Review Press photographer) Complete -  Some recent data might be hidden

## 2020-04-03 ENCOUNTER — Ambulatory Visit (HOSPITAL_COMMUNITY): Admission: RE | Admit: 2020-04-03 | Payer: Medicare Other | Source: Ambulatory Visit

## 2020-04-03 LAB — CBC
HCT: 27.9 % — ABNORMAL LOW (ref 36.0–46.0)
Hemoglobin: 9.2 g/dL — ABNORMAL LOW (ref 12.0–15.0)
MCH: 28.1 pg (ref 26.0–34.0)
MCHC: 33 g/dL (ref 30.0–36.0)
MCV: 85.3 fL (ref 80.0–100.0)
Platelets: 109 10*3/uL — ABNORMAL LOW (ref 150–400)
RBC: 3.27 MIL/uL — ABNORMAL LOW (ref 3.87–5.11)
RDW: 16.2 % — ABNORMAL HIGH (ref 11.5–15.5)
WBC: 3.2 10*3/uL — ABNORMAL LOW (ref 4.0–10.5)
nRBC: 0 % (ref 0.0–0.2)

## 2020-04-03 LAB — BASIC METABOLIC PANEL
Anion gap: 8 (ref 5–15)
Anion gap: 10 (ref 5–15)
Anion gap: 9 (ref 5–15)
BUN: 13 mg/dL (ref 8–23)
BUN: 11 mg/dL (ref 8–23)
BUN: 10 mg/dL (ref 8–23)
CO2: 21 mmol/L — ABNORMAL LOW (ref 22–32)
CO2: 20 mmol/L — ABNORMAL LOW (ref 22–32)
CO2: 20 mmol/L — ABNORMAL LOW (ref 22–32)
Calcium: 8.6 mg/dL — ABNORMAL LOW (ref 8.9–10.3)
Calcium: 8.7 mg/dL — ABNORMAL LOW (ref 8.9–10.3)
Calcium: 8.8 mg/dL — ABNORMAL LOW (ref 8.9–10.3)
Chloride: 98 mmol/L (ref 98–111)
Chloride: 100 mmol/L (ref 98–111)
Chloride: 103 mmol/L (ref 98–111)
Creatinine, Ser: 0.83 mg/dL (ref 0.44–1.00)
Creatinine, Ser: 0.83 mg/dL (ref 0.44–1.00)
Creatinine, Ser: 0.79 mg/dL (ref 0.44–1.00)
GFR calc Af Amer: >60 mL/min (ref >60)
GFR calc Af Amer: >60 mL/min (ref >60)
GFR calc Af Amer: >60 mL/min (ref >60)
GFR calc non Af Amer: >60 mL/min (ref >60)
GFR calc non Af Amer: >60 mL/min (ref >60)
GFR calc non Af Amer: >60 mL/min (ref >60)
Glucose, Bld: 108 mg/dL — ABNORMAL HIGH (ref 70–99)
Glucose, Bld: 107 mg/dL — ABNORMAL HIGH (ref 70–99)
Glucose, Bld: 122 mg/dL — ABNORMAL HIGH (ref 70–99)
Potassium: 4.6 mmol/L (ref 3.5–5.1)
Potassium: 4.5 mmol/L (ref 3.5–5.1)
Potassium: 4.4 mmol/L (ref 3.5–5.1)
Sodium: 127 mmol/L — ABNORMAL LOW (ref 135–145)
Sodium: 130 mmol/L — ABNORMAL LOW (ref 135–145)
Sodium: 132 mmol/L — ABNORMAL LOW (ref 135–145)

## 2020-04-03 LAB — SODIUM, URINE, RANDOM: Sodium, Ur: <10 mmol/L

## 2020-04-03 LAB — MAGNESIUM: Magnesium: 1.9 mg/dL (ref 1.7–2.4)

## 2020-04-03 LAB — OSMOLALITY, URINE: Osmolality, Ur: 111 mOsm/kg — ABNORMAL LOW (ref 300–900)

## 2020-04-03 MED ORDER — SODIUM CHLORIDE 0.9 % IV SOLN: INTRAVENOUS | Status: AC

## 2020-04-03 MED ORDER — SODIUM CHLORIDE 0.9 % IV SOLN: Freq: Once | INTRAVENOUS | Status: AC

## 2020-04-03 NOTE — Progress Notes (Signed)
PROGRESS NOTE    [name]  [facility]:[id number] DOB: [birth year] DOA: 04/02/2020 PCP: [name], MD   Brief Narrative:  Per HPI: [name] is a 64 y.o. female with medical history significant for prior CVA with noted aneurysmal clipping at the age of 61, seizure disorder, hypertension, ongoing alcohol abuse, and GERD, who presented to the ED with some complaints of not acting per her usual baseline.  This was noted by her home health aide as well as her friend whom she lives with.  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  There were no signs of any seizure activity noted.  Additionally, there is no new noted facial droop, dysarthria, headache, visual disturbances, or any other findings that were noted.   ED Course: Stable vital signs noted with some pancytopenia noted on lab work.  Serum sodium level is 121.  Serum bicarbonate is 19.  Urine analysis negative for any acute findings.  CT of the head as well as CT angiogram with no acute findings.  Aneurysmal clips noted.  Patient responds well to questioning and appears alert and oriented x3.  She is able to respond with meaningful answers to questioning.  -Hyponatremia in mentation improving.  Continue IV fluid and fluid restriction.  No sign of SIADH noted and TSH 1.203.  Assessment & Plan:   Active Problems:   Acute metabolic encephalopathy   Acute metabolic encephalopathy secondary to moderate hyponatremia-improving -This is likely secondary to beer potomania as it is described that she drinks approximately 2-3 beers per day -Appears euvolemic -TSH within normal limits -No sign of SIADH noted -Normal saline bolus given in ED and will maintain on gentle normal saline x8 hours for now -Sodium level up to 130 -Maintain on fluid restriction  Chronic alcohol use -Maintain on CIWA precautions -Blood alcohol level  negative -Reportedly drinks 2-3 cans of beer daily -Discussed cessation  Pancytopenia-stable -Likely secondary to chronic alcohol abuse -Continue to monitor repeat CBC -Avoid heparin agents  History of prior CVA with aneurysmal clipping and associated seizures -CT imaging with no acute findings -MRI cannot be performed due to aneurysmal clip -Continue home antiepileptics of phenobarbital and phenytoin  History of GERD -Continue PPI  History of hypertension-controlled -Continue lisinopril   DVT prophylaxis: SCDs Code Status: Full code Family Communication: Discussed with friend on 6/16 Disposition Plan: Continue normal saline IV fluid and monitor sodium levels.  Anticipate discharge once improved. Status is: Inpatient  Remains inpatient appropriate because:IV treatments appropriate due to intensity of illness or inability to take PO   Dispo: The patient is from: Home              Anticipated d/c is to: Home              Anticipated d/c date is: 1 day              Patient currently is not medically stable to d/c.   Consultants:   None  Procedures:   See below  Antimicrobials:   None   Subjective: Patient seen and evaluated today with no new acute complaints or concerns. No acute concerns or events noted overnight.  She appears to be more conversational this morning.  Objective: Vitals:   04/03/20 0052 04/03/20 0415 04/03/20 0601 04/03/20 0800  BP: (!) 125/55 (!) 86/41 121/61 118/73  Pulse: 90 91 86 82  Resp:  16  18  Temp: 98.2 F (36.8 C) 97.6 F (36.4 C)    TempSrc: Oral Oral    SpO2: 100% 99%  100%  Weight:      Height:        Intake/Output Summary (Last 24 hours) at 04/03/2020 1138 Last data filed at 04/03/2020 0500 Gross per 24 hour  Intake 1445.78 ml  Output 1650 ml  Net -204.22 ml   Filed Weights   04/02/20 0903 04/02/20 2029  Weight: 74.8 kg 73.3 kg    Examination:  General exam: Appears calm and comfortable  Respiratory  system: Clear to auscultation. Respiratory effort normal. Cardiovascular system: S1 & S2 heard, RRR. No JVD, murmurs, rubs, gallops or clicks. No pedal edema. Gastrointestinal system: Abdomen is nondistended, soft and nontender. No organomegaly or masses felt. Normal bowel sounds heard. Central nervous system: Alert and oriented. No focal neurological deficits. Extremities: Symmetric 5 x 5 power.  Right upper extremity contracture noted. Skin: No rashes, lesions or ulcers Psychiatry: Judgement and insight appear normal. Mood & affect appropriate.     Data Reviewed: I have personally reviewed following labs and imaging studies  CBC: Recent Labs  Lab 04/02/20 1031 04/03/20 0313  WBC 3.0* 3.2*  NEUTROABS 1.6*  --   HGB 10.5* 9.2*  HCT 31.8* 27.9*  MCV 83.5 85.3  PLT 101* 269*   Basic Metabolic Panel: Recent Labs  Lab 04/02/20 1527 04/02/20 1900 04/02/20 2250 04/03/20 0313 04/03/20 0745  NA 123* 125* 127* 127* 130*  K 4.4 4.4 4.3 4.6 4.5  CL 92* 95* 95* 98 100  CO2 21* 20* 20* 21* 20*  GLUCOSE 104* 113* 108* 108* 107*  BUN 9 9 11 13 11   CREATININE 0.78 0.80 0.84 0.83 0.83  CALCIUM 8.9 9.2 9.0 8.6* 8.7*  MG  --   --   --  1.9  --    GFR: Estimated Creatinine Clearance: 68 mL/min (by C-G formula based on SCr of 0.83 mg/dL). Liver Function Tests: Recent Labs  Lab 04/02/20 1031  AST 67*  ALT 37  ALKPHOS 93  BILITOT 0.8  PROT 9.0*  ALBUMIN 4.4   No results for input(s): LIPASE, AMYLASE in the last 168 hours. Recent Labs  Lab 04/02/20 1349  AMMONIA 19   Coagulation Profile: No results for input(s): INR, PROTIME in the last 168 hours. Cardiac Enzymes: No results for input(s): CKTOTAL, CKMB, CKMBINDEX, TROPONINI in the last 168 hours. BNP (last 3 results) No results for input(s): PROBNP in the last 8760 hours. HbA1C: No results for input(s): HGBA1C in the last 72 hours. CBG: No results for input(s): GLUCAP in the last 168 hours. Lipid Profile: No results  for input(s): CHOL, HDL, LDLCALC, TRIG, CHOLHDL, LDLDIRECT in the last 72 hours. Thyroid Function Tests: Recent Labs    04/02/20 1031  TSH 1.203   Anemia Panel: No results for input(s): VITAMINB12, FOLATE, FERRITIN, TIBC, IRON, RETICCTPCT in the last 72 hours. Sepsis Labs: No results for input(s): PROCALCITON, LATICACIDVEN in the last 168 hours.  No results found for this or any previous visit (from the past 240 hour(s)).       Radiology Studies: CT Angio Head W or Wo Contrast  Result Date: 04/02/2020 CLINICAL DATA:  Right-sided weakness and slurred speech. EXAM: CT ANGIOGRAPHY HEAD AND NECK TECHNIQUE: Multidetector CT imaging of the head and neck was performed using the standard protocol during bolus administration of intravenous contrast. Multiplanar CT image reconstructions and MIPs were obtained to evaluate the vascular anatomy. Carotid  stenosis measurements (when applicable) are obtained utilizing NASCET criteria, using the distal internal carotid diameter as the denominator. CONTRAST:  58mL OMNIPAQUE IOHEXOL 350 MG/ML SOLN COMPARISON:  CT head 04/02/2020 FINDINGS: CTA NECK FINDINGS Aortic arch: Standard branching. Imaged portion shows no evidence of aneurysm or dissection. No significant stenosis of the major arch vessel origins. Mild atherosclerotic calcification in the aortic arch. Right carotid system: Right carotid widely patent without stenosis or atherosclerotic disease Left carotid system: Left carotid widely patent. Minimal atherosclerotic calcification left carotid bifurcation Vertebral arteries: Left vertebral artery dominant. Both vertebral arteries patent to the basilar without stenosis. Skeleton: Cervical spondylosis.  No acute skeletal abnormality. Other neck: No soft tissue mass or adenopathy in the neck. Upper chest: Lung apices clear bilaterally. Review of the MIP images confirms the above findings CTA HEAD FINDINGS Anterior circulation: Internal carotid artery widely  patent bilaterally without significant atherosclerotic disease or stenosis. 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 This does not fill with contrast. There is a surgical clip lateral to the aneurysm. Anterior and middle cerebral arteries patent without stenosis or occlusion. No other aneurysm identified. Posterior circulation: Both vertebral arteries patent to the basilar. Right PICA patent. Left PICA not visualized. Left AICA is a prominent vessel and patent. Superior cerebellar and posterior cerebral arteries patent bilaterally without significant stenosis. Venous sinuses: Normal venous enhancement Anatomic variants: None Review of the MIP images confirms the above findings IMPRESSION: 1. No  intracranial occlusion or significant stenosis 2. 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 This is not filled with contrast. There is an aneurysm clip adjacent and lateral to the aneurysm. Otherwise no aneurysm is identified. 3. No significant carotid or vertebral artery stenosis in the neck. Electronically Signed   By: Franchot Gallo M.D.   On: 04/02/2020 12:46   CT Head Wo Contrast  Result Date: 04/02/2020 CLINICAL DATA:  Slurred speech and right-sided numbness EXAM: CT HEAD WITHOUT CONTRAST TECHNIQUE: Contiguous axial images were obtained from the base of the skull through the vertex without intravenous contrast. COMPARISON:  07/30/2016 FINDINGS: Brain: Bilateral inferior frontal and left superior frontal encephalomalacia with ex vacuo enlargement. There has been aneurysm clipping at the right circle-of-Willis with a calcified 10 mm mass likely reflecting calcified aneurysm. No evidence of acute infarct, acute hemorrhage, hydrocephalus, or shift. Vascular: Negative Skull: Remote right frontal craniotomy Sinuses/Orbits: Negative IMPRESSION: 1. No acute finding. 2. 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 3. Prior aneurysm clipping. Electronically Signed    By: Monte Fantasia M.D.   On: 04/02/2020 09:59   CT Angio Neck W and/or Wo Contrast  Result Date: 04/02/2020 CLINICAL DATA:  Right-sided weakness and slurred speech. EXAM: CT ANGIOGRAPHY HEAD AND NECK TECHNIQUE: Multidetector CT imaging of the head and neck was performed using the standard protocol during bolus administration of intravenous contrast. Multiplanar CT image reconstructions and MIPs were obtained to evaluate the vascular anatomy. Carotid stenosis measurements (when applicable) are obtained utilizing NASCET criteria, using the distal internal carotid diameter as the denominator. CONTRAST:  3mL OMNIPAQUE IOHEXOL 350 MG/ML SOLN COMPARISON:  CT head 04/02/2020 FINDINGS: CTA NECK FINDINGS Aortic arch: Standard branching. Imaged portion shows no evidence of aneurysm or dissection. No significant stenosis of the major arch vessel origins. Mild atherosclerotic calcification in the aortic arch. Right carotid system: Right carotid widely patent without stenosis or atherosclerotic disease Left carotid system: Left carotid widely patent. Minimal atherosclerotic calcification left carotid bifurcation Vertebral arteries: Left vertebral artery dominant. Both  vertebral arteries patent to the basilar without stenosis. Skeleton: Cervical spondylosis.  No acute skeletal abnormality. Other neck: No soft tissue mass or adenopathy in the neck. Upper chest: Lung apices clear bilaterally. Review of the MIP images confirms the above findings CTA HEAD FINDINGS Anterior circulation: Internal carotid artery widely patent bilaterally without significant atherosclerotic disease or stenosis. 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 This does not fill with contrast. There is a surgical clip lateral to the aneurysm. Anterior and middle cerebral arteries patent without stenosis or occlusion. No other aneurysm identified. Posterior circulation: Both vertebral arteries patent to the  basilar. Right PICA patent. Left PICA not visualized. Left AICA is a prominent vessel and patent. Superior cerebellar and posterior cerebral arteries patent bilaterally without significant stenosis. Venous sinuses: Normal venous enhancement Anatomic variants: None Review of the MIP images confirms the above findings IMPRESSION: 1. No  intracranial occlusion or significant stenosis 2. 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 This is not filled with contrast. There is an aneurysm clip adjacent and lateral to the aneurysm. Otherwise no aneurysm is identified. 3. No significant carotid or vertebral artery stenosis in the neck. Electronically Signed   By: Franchot Gallo M.D.   On: 04/02/2020 12:46        Scheduled Meds: . darifenacin  7.5 mg Oral Daily  . feeding supplement (ENSURE ENLIVE)  237 mL Oral TID BM  . folic acid  1 mg Oral Daily  . linaclotide  145 mcg Oral QAC breakfast  . lisinopril  40 mg Oral Daily  . multivitamin with minerals  1 tablet Oral Daily  . naproxen  500 mg Oral BID WC  . pantoprazole  40 mg Oral BID AC  . PHENobarbital  32.4 mg Oral BID  . phenytoin  100 mg Oral TID  . pneumococcal 23 valent vaccine  0.5 mL Intramuscular Tomorrow-1000  . sucralfate  1 g Oral TID WC & HS  . thiamine  100 mg Oral Daily   Continuous Infusions: . sodium chloride 75 mL/hr at 04/03/20 0807     LOS: 1 day    Time spent: 35 minutes    Pratik Darleen Crocker, DO [facility]  If 7PM-7AM, please contact night-coverage [email or web address] 04/03/2020, 11:38 AM

## 2020-04-04 LAB — BASIC METABOLIC PANEL
Anion gap: 10 (ref 5–15)
BUN: 11 mg/dL (ref 8–23)
CO2: 22 mmol/L (ref 22–32)
Calcium: 8.7 mg/dL — ABNORMAL LOW (ref 8.9–10.3)
Chloride: 102 mmol/L (ref 98–111)
Creatinine, Ser: 0.73 mg/dL (ref 0.44–1.00)
GFR calc Af Amer: >60 mL/min (ref >60)
GFR calc non Af Amer: >60 mL/min (ref >60)
Glucose, Bld: 106 mg/dL — ABNORMAL HIGH (ref 70–99)
Potassium: 4.1 mmol/L (ref 3.5–5.1)
Sodium: 134 mmol/L — ABNORMAL LOW (ref 135–145)

## 2020-04-04 MED ORDER — CHLORDIAZEPOXIDE HCL 10 MG PO CAPS: 10.0000 mg | ORAL_CAPSULE | Freq: Three times a day (TID) | ORAL | 0 refills | Status: DC | PRN

## 2020-04-04 NOTE — Plan of Care (Signed)
  Problem: Acute Rehab PT Goals(only PT should resolve) Goal: Pt Will Go Supine/Side To Sit Outcome: Progressing Flowsheets (Taken 04/04/2020 1428) Pt will go Supine/Side to Sit: with min guard assist Goal: Pt Will Go Sit To Supine/Side Outcome: Progressing Flowsheets (Taken 04/04/2020 1428) Pt will go Sit to Supine/Side: with min guard assist Goal: Patient Will Transfer Sit To/From Stand Outcome: Progressing Flowsheets (Taken 04/04/2020 1428) Patient will transfer sit to/from stand: with minimal assist Goal: Pt Will Transfer Bed To Chair/Chair To Bed Outcome: Progressing Flowsheets (Taken 04/04/2020 1428) Pt will Transfer Bed to Chair/Chair to Bed: with min assist Goal: Pt Will Ambulate Outcome: Progressing Flowsheets (Taken 04/04/2020 1428) Pt will Ambulate:  with moderate assist  with minimal assist  25 feet  with rolling walker  2:29 PM, 04/04/20 Mearl Latin PT, DPT Physical Therapist at [facility]

## 2020-04-04 NOTE — Discharge Summary (Signed)
Physician Discharge Summary  [name] YPP:[id number] DOB: [birth year] DOA: 04/02/2020  PCP: [name], MD  Admit date: 04/02/2020  Discharge date: 04/04/2020  Admitted From:Home  Disposition:  Home  Recommendations for Outpatient Follow-up:  1. Follow up with PCP in 1-2 weeks, follow-up BMP in 1 week 2. Patient given Librium as needed for anxiety/withdrawal 3. Continue home medications as prescribed otherwise 4. Counseled on alcohol cessation  Home Health: Yes with PT/OT as well as [employer] [employer].  Patient was evaluated by PT with recommendation for SNF, but she declined.  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 This was noted by her home health aide as well as her friend whom she lives with. 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 There were no signs of any seizure activity noted. Additionally, there is no new noted facial droop, dysarthria, headache, visual disturbances, or any other findings that were noted.  ED Course:Stable vital signs noted with some pancytopenia noted on lab work. Serum sodium level is 121. Serum bicarbonate is 19. Urine analysis negative for any acute findings. CT of the head as well as CT angiogram with no acute findings.Aneurysmal clips noted. Patient responds well to questioning and appears alert and oriented x3. She is able to respond with meaningful answers to questioning.  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  Stable for discharge from medical standpoint today. -PT evaluation with recommendation for SNF, but patient refuses and therefore will be set up with home health PT.  Chronic alcohol use -Maintain on CIWA precautions -Blood alcohol level negative -Reportedly drinks 2-3 cans of beer daily -Discussed cessation  Pancytopenia-stable -Likely secondary to chronic alcohol abuse -Continue to monitor repeat CBC -Avoid heparin agents  History of prior CVA with aneurysmal clipping and associated seizures -CT imaging with no acute findings -MRI cannot be performed due to aneurysmal clip -Continue home antiepileptics of phenobarbital and phenytoin  History of GERD -Continue PPI  History of hypertension-controlled -Continue lisinopril  Discharge Diagnoses:  Active Problems:   Acute metabolic encephalopathy    Discharge Instructions  Discharge Instructions    Diet - low sodium heart healthy   Complete by: As directed    Increase activity slowly   Complete by: As directed      Allergies as of 04/04/2020   No Known Allergies     Medication List    TAKE these medications   acetaminophen 325 MG tablet Commonly known as: TYLENOL Take 325-650 mg by mouth every 6 (six) hours as needed.   albuterol (2.5 MG/3ML) 0.083% nebulizer solution Commonly known as: PROVENTIL Take 2.5 mg by nebulization every 6 (six) hours as needed for wheezing or shortness of breath.   albuterol 108 (90 Base) MCG/ACT inhaler Commonly known as: VENTOLIN HFA Inhale  2 puffs into the lungs every 6 (six) hours as needed for wheezing or shortness of breath.    chlordiazePOXIDE 10 MG capsule Commonly known as: LIBRIUM Take 1 capsule (10 mg total) by mouth 3 (three) times daily as needed for anxiety or withdrawal.   dicyclomine 20 MG tablet Commonly known as: BENTYL Take 1 tablet (20 mg total) by mouth every 6 (six) hours as needed for spasms.   Ensure Plus Liqd Take 237 mLs by mouth 3 (three) times daily between meals.   folic acid 1 MG tablet Commonly known as: FOLVITE Take 1 tablet (1 mg total) by mouth daily.   HYDROcodone-acetaminophen 7.5-325 mg/15 ml solution Commonly known as: HYCET TAKE 10MLS BY MOUTH 4 TIMES DAILY AS NEEDED.   ipratropium-albuterol 0.5-2.5 (3) MG/3ML Soln Commonly known as: DUONEB Take 3 mLs by nebulization every 6 (six) hours as needed (for shortness of breath/wheezing).   Linzess 145 MCG Caps capsule Generic drug: linaclotide Take 145 mcg by mouth daily before breakfast.   lisinopril 40 MG tablet Commonly known as: ZESTRIL Take 1 tablet (40 mg total) by mouth daily.   multivitamin with minerals Tabs tablet Take 1 tablet by mouth daily.   naproxen 500 MG tablet Commonly known as: NAPROSYN TAKE (1) TABLET BY MOUTH TWICE DAILY WITH A MEAL.   ondansetron 4 MG tablet Commonly known as: Zofran Take 1 tablet (4 mg total) by mouth every 8 (eight) hours as needed for nausea or vomiting.   pantoprazole 40 MG tablet Commonly known as: Protonix Take 1 tablet (40 mg total) by mouth 2 (two) times daily before a meal.   PHENobarbital 32.4 MG tablet Commonly known as: LUMINAL Take 32.4 mg by mouth 2 (two) times daily.   phenytoin 100 MG ER capsule Commonly known as: DILANTIN Take 100 mg by mouth 3 (three) times daily.   solifenacin 5 MG tablet Commonly known as: VESICARE Take 5 mg by mouth daily.   sucralfate 1 GM/10ML suspension Commonly known as: CARAFATE Take 10 mLs (1 g total) by mouth 4 (four) times daily -  with meals and at bedtime.   thiamine 100 MG tablet Take 1 tablet (100 mg total) by  mouth daily.       Follow-up Information    [name], MD Follow up in 1 week(s).   Specialty: Internal Medicine Contact information: [place] [address] [phone]              No Known Allergies  Consultations:  None   Procedures/Studies: CT Angio Head W or Wo Contrast  Result Date: 04/02/2020 CLINICAL DATA:  Right-sided weakness and slurred speech. EXAM: CT ANGIOGRAPHY HEAD AND NECK TECHNIQUE: Multidetector CT imaging of the head and neck was performed using the standard protocol during bolus administration of intravenous contrast. Multiplanar CT image reconstructions and MIPs were obtained to evaluate the vascular anatomy. Carotid stenosis measurements (when applicable) are obtained utilizing NASCET criteria, using the distal internal carotid diameter as the denominator. CONTRAST:  46mL OMNIPAQUE IOHEXOL 350 MG/ML SOLN COMPARISON:  CT head 04/02/2020 FINDINGS: CTA NECK FINDINGS Aortic arch: Standard branching. Imaged portion shows no evidence of aneurysm or dissection. No significant stenosis of the major arch vessel origins. Mild atherosclerotic calcification in the aortic arch. Right carotid system: Right carotid widely patent without stenosis or atherosclerotic disease Left carotid system: Left carotid widely patent. Minimal atherosclerotic calcification left carotid bifurcation Vertebral arteries: Left vertebral artery dominant. Both vertebral arteries patent to the basilar without stenosis. Skeleton: Cervical  spondylosis.  No acute skeletal abnormality. Other neck: No soft tissue mass or adenopathy in the neck. Upper chest: Lung apices clear bilaterally. Review of the MIP images confirms the above findings CTA HEAD FINDINGS Anterior circulation: Internal carotid artery widely patent bilaterally without significant atherosclerotic disease or stenosis. 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 This does not  fill with contrast. There is a surgical clip lateral to the aneurysm. Anterior and middle cerebral arteries patent without stenosis or occlusion. No other aneurysm identified. Posterior circulation: Both vertebral arteries patent to the basilar. Right PICA patent. Left PICA not visualized. Left AICA is a prominent vessel and patent. Superior cerebellar and posterior cerebral arteries patent bilaterally without significant stenosis. Venous sinuses: Normal venous enhancement Anatomic variants: None Review of the MIP images confirms the above findings IMPRESSION: 1. No  intracranial occlusion or significant stenosis 2. 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 This is not filled with contrast. There is an aneurysm clip adjacent and lateral to the aneurysm. Otherwise no aneurysm is identified. 3. No significant carotid or vertebral artery stenosis in the neck. Electronically Signed   By: Franchot Gallo M.D.   On: 04/02/2020 12:46   CT Head Wo Contrast  Result Date: 04/02/2020 CLINICAL DATA:  Slurred speech and right-sided numbness EXAM: CT HEAD WITHOUT CONTRAST TECHNIQUE: Contiguous axial images were obtained from the base of the skull through the vertex without intravenous contrast. COMPARISON:  07/30/2016 FINDINGS: Brain: Bilateral inferior frontal and left superior frontal encephalomalacia with ex vacuo enlargement. There has been aneurysm clipping at the right circle-of-Willis with a calcified 10 mm mass likely reflecting calcified aneurysm. No evidence of acute infarct, acute hemorrhage, hydrocephalus, or shift. Vascular: Negative Skull: Remote right frontal craniotomy Sinuses/Orbits: Negative IMPRESSION: 1. No acute finding. 2. 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 3. Prior aneurysm clipping. Electronically Signed   By: Monte Fantasia M.D.   On: 04/02/2020 09:59   CT Angio Neck W and/or Wo Contrast  Result Date: 04/02/2020 CLINICAL DATA:  Right-sided weakness and slurred  speech. EXAM: CT ANGIOGRAPHY HEAD AND NECK TECHNIQUE: Multidetector CT imaging of the head and neck was performed using the standard protocol during bolus administration of intravenous contrast. Multiplanar CT image reconstructions and MIPs were obtained to evaluate the vascular anatomy. Carotid stenosis measurements (when applicable) are obtained utilizing NASCET criteria, using the distal internal carotid diameter as the denominator. CONTRAST:  79mL OMNIPAQUE IOHEXOL 350 MG/ML SOLN COMPARISON:  CT head 04/02/2020 FINDINGS: CTA NECK FINDINGS Aortic arch: Standard branching. Imaged portion shows no evidence of aneurysm or dissection. No significant stenosis of the major arch vessel origins. Mild atherosclerotic calcification in the aortic arch. Right carotid system: Right carotid widely patent without stenosis or atherosclerotic disease Left carotid system: Left carotid widely patent. Minimal atherosclerotic calcification left carotid bifurcation Vertebral arteries: Left vertebral artery dominant. Both vertebral arteries patent to the basilar without stenosis. Skeleton: Cervical spondylosis.  No acute skeletal abnormality. Other neck: No soft tissue mass or adenopathy in the neck. Upper chest: Lung apices clear bilaterally. Review of the MIP images confirms the above findings CTA HEAD FINDINGS Anterior circulation: Internal carotid artery widely patent bilaterally without significant atherosclerotic disease or stenosis. 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 This does not fill with contrast. There is a surgical clip lateral to the aneurysm. Anterior and middle cerebral arteries patent without stenosis or occlusion. No other aneurysm identified. Posterior circulation: Both vertebral arteries patent  to the basilar. Right PICA patent. Left PICA not visualized. Left AICA is a prominent vessel and patent. Superior cerebellar and posterior cerebral arteries patent bilaterally  without significant stenosis. Venous sinuses: Normal venous enhancement Anatomic variants: None Review of the MIP images confirms the above findings IMPRESSION: 1. No  intracranial occlusion or significant stenosis 2. 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 This is not filled with contrast. There is an aneurysm clip adjacent and lateral to the aneurysm. Otherwise no aneurysm is identified. 3. No significant carotid or vertebral artery stenosis in the neck. Electronically Signed   By: Franchot Gallo M.D.   On: 04/02/2020 12:46   MM 3D SCREEN BREAST BILATERAL  Result Date: 04/01/2020 CLINICAL DATA:  Screening. EXAM: DIGITAL SCREENING BILATERAL MAMMOGRAM WITH TOMO AND CAD COMPARISON:  Previous exam(s). ACR Breast Density Category b: There are scattered areas of fibroglandular density. FINDINGS: There are no findings suspicious for malignancy. Images were processed with CAD. IMPRESSION: No mammographic evidence of malignancy. A result letter of this screening mammogram will be mailed directly to the patient. RECOMMENDATION: Screening mammogram in one year. (Code:SM-B-01Y) BI-RADS CATEGORY  1: Negative. Electronically Signed   By: Lovey Newcomer M.D.   On: 04/01/2020 10:03     Discharge Exam: Vitals:   04/04/20 0800 04/04/20 1301  BP:  (!) 130/54  Pulse:  88  Resp:  18  Temp:  98.7 F (37.1 C)  SpO2: 98% 100%   Vitals:   04/04/20 0408 04/04/20 0637 04/04/20 0800 04/04/20 1301  BP: (!) 92/52 (!) 99/55  (!) 130/54  Pulse:  85  88  Resp: 16 20  18   Temp: 98.3 F (36.8 C) 98 F (36.7 C)  98.7 F (37.1 C)  TempSrc: Oral Oral  Oral  SpO2:  100% 98% 100%  Weight:      Height:        General: Pt is alert, awake, not in acute distress Cardiovascular: RRR, S1/S2 +, no rubs, no gallops Respiratory: CTA bilaterally, no wheezing, no rhonchi Abdominal: Soft, NT, ND, bowel sounds + Extremities: no edema, no cyanosis, right arm with contracture    The results of significant  diagnostics from this hospitalization (including imaging, microbiology, ancillary and laboratory) are listed below for reference.     Microbiology: No results found for this or any previous visit (from the past 240 hour(s)).   Labs: BNP (last 3 results) No results for input(s): BNP in the last 8760 hours. Basic Metabolic Panel: Recent Labs  Lab 04/02/20 2250 04/03/20 0313 04/03/20 0745 04/03/20 1315 04/04/20 0505  NA 127* 127* 130* 132* 134*  K 4.3 4.6 4.5 4.4 4.1  CL 95* 98 100 103 102  CO2 20* 21* 20* 20* 22  GLUCOSE 108* 108* 107* 122* 106*  BUN 11 13 11 10 11   CREATININE 0.84 0.83 0.83 0.79 0.73  CALCIUM 9.0 8.6* 8.7* 8.8* 8.7*  MG  --  1.9  --   --   --    Liver Function Tests: Recent Labs  Lab 04/02/20 1031  AST 67*  ALT 37  ALKPHOS 93  BILITOT 0.8  PROT 9.0*  ALBUMIN 4.4   No results for input(s): LIPASE, AMYLASE in the last 168 hours. Recent Labs  Lab 04/02/20 1349  AMMONIA 19   CBC: Recent Labs  Lab 04/02/20 1031 04/03/20 0313  WBC 3.0* 3.2*  NEUTROABS 1.6*  --   HGB 10.5* 9.2*  HCT 31.8* 27.9*  MCV 83.5 85.3  PLT 101* 109*  Cardiac Enzymes: No results for input(s): CKTOTAL, CKMB, CKMBINDEX, TROPONINI in the last 168 hours. BNP: Invalid input(s): POCBNP CBG: No results for input(s): GLUCAP in the last 168 hours. D-Dimer No results for input(s): DDIMER in the last 72 hours. Hgb A1c No results for input(s): HGBA1C in the last 72 hours. Lipid Profile No results for input(s): CHOL, HDL, LDLCALC, TRIG, CHOLHDL, LDLDIRECT in the last 72 hours. Thyroid function studies Recent Labs    04/02/20 1031  TSH 1.203   Anemia work up No results for input(s): VITAMINB12, FOLATE, FERRITIN, TIBC, IRON, RETICCTPCT in the last 72 hours. Urinalysis    Component Value Date/Time   COLORURINE YELLOW 04/02/2020 1049   APPEARANCEUR CLEAR 04/02/2020 1049   LABSPEC 1.003 (L) 04/02/2020 1049   PHURINE 5.0 04/02/2020 1049   GLUCOSEU NEGATIVE 04/02/2020  1049   HGBUR NEGATIVE 04/02/2020 1049   BILIRUBINUR NEGATIVE 04/02/2020 1049   KETONESUR NEGATIVE 04/02/2020 1049   PROTEINUR NEGATIVE 04/02/2020 1049   UROBILINOGEN 0.2 12/26/2014 1927   NITRITE NEGATIVE 04/02/2020 1049   LEUKOCYTESUR NEGATIVE 04/02/2020 1049   Sepsis Labs Invalid input(s): PROCALCITONIN,  WBC,  LACTICIDVEN Microbiology No results found for this or any previous visit (from the past 240 hour(s)).   Time coordinating discharge: 35 minutes  SIGNED:   Rodena Goldmann, DO [facility] 04/04/2020, 3:54 PM  If 7PM-7AM, please contact night-coverage [email or web address]

## 2020-04-04 NOTE — TOC Transition Note (Signed)
Transition of Care [facility]) - CM/SW Discharge Note   Patient Details  Name: [name] MRN: [id number] Date of Birth: 1956/07/22  [facility]) CM/SW Contact:  Boneta Lucks, RN Phone Number: 04/04/2020, 4:48 PM   Clinical Narrative:   Patient admitted with Acute metabolic encephalopathy. PT is recommending SNF, patient is refusing. TOC called [facility], [facility], [facility], [facility], [facility], [facility], none of these can accept her for [facility]. Patient agreed to  [facility]. Orders signed and faxed.  Discuss with patient to follow up with PCP and at any time that she can get transportation she can he PCP to order out patient PT. Patient states she understands.     Final next level of care: [place] Barriers to Discharge: Barriers Resolved   Patient Goals and CMS Choice Patient states their goals for this hospitalization and ongoing recovery are:: Return home CMS Medicare.gov Compare Post Acute Care list provided to:: Patient Choice offered to / list presented to : Patient  Discharge Placement              Discharge Plan and Services In-house Referral: Clinical Social Work   Post Acute Care Choice: Home Health              Readmission Risk Interventions Readmission Risk Prevention Plan 11/05/2019 04/13/2019  Transportation Screening Complete Complete  PCP or Specialist Appt within 5-7 Days - Complete  PCP or Specialist Appt within 3-5 Days Complete -  Home Care Screening - Complete  Medication Review (RN CM) - Complete  HRI or Home Care Consult Complete -  Social Work Consult for [place] Planning/Counseling Complete -  Palliative Care Screening Not Applicable -  Medication Review Press photographer) Complete -  Some recent data might be hidden

## 2020-04-04 NOTE — Evaluation (Signed)
Physical Therapy Evaluation Patient Details Name: [name] MRN: [id number] DOB: [birth year] Today's Date: 04/04/2020   History of Present Illness  [name] is a 64 y.o. female with medical history significant for prior CVA with noted aneurysmal clipping at the age of 65, seizure disorder, hypertension, ongoing alcohol abuse, and GERD, who presented to the ED with some complaints of not acting per her usual baseline.  This was noted by her home health aide as well as her friend whom she lives with.  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  There were no signs of any seizure activity noted.  Additionally, there is no new noted facial droop, dysarthria, headache, visual disturbances, or any other findings that were noted.    Clinical Impression  Patient limited for functional mobility as stated below secondary to BLE weakness, fatigue and poor standing balance. Patient requires min assist to transition seated<> supine. She demonstrates good sitting tolerance EOB and good seated balance. She requires mod assist with unilateral support on RW secondary to LE weakness and R UE contracture. She demonstrates min/mod unsteadiness with standing and ambulation initially. After ambulating several feet, patient had a blank look on her face and stated she felt bad. Patient required mod/max assist to turn and return to chair. Required max assist from PT and RN to transfer patient from forward facing on chair to seated in chair. Patient required max assist to stand pivot for tranfers back to bed - RN aware. Patient will benefit from continued physical therapy in hospital and recommended venue below to increase strength, balance, endurance for safe ADLs and gait.     Follow Up Recommendations SNF    Equipment Recommendations  None recommended by PT    Recommendations for Other Services        Precautions / Restrictions Precautions Precautions: Fall Restrictions Weight Bearing Restrictions: No      Mobility  Bed Mobility Overal bed mobility: Needs Assistance Bed Mobility: Supine to Sit;Sit to Supine     Supine to sit: Min assist;HOB elevated Sit to supine: Min assist   General bed mobility comments: slow, labored transition to seated EOB  Transfers Overall transfer level: Needs assistance Equipment used: Rolling walker (2 wheeled) Transfers: Sit to/from [facility] Sit to Stand: Mod assist Stand pivot transfers: Mod assist;Max assist       General transfer comment: initial transfer from bed to standing required mod physical assist secondary to weakness with RW. After ambulating patient required mod/max assist to stand pivot from chair to bed  Ambulation/Gait Ambulation/Gait assistance: Mod assist;Max assist Gait Distance (Feet): 8 Feet Assistive device: Rolling walker (2 wheeled) Gait Pattern/deviations: Decreased step length - right;Decreased step length - left;Decreased stride length Gait velocity: decreased   General Gait Details: Patient ambulates with slow, labored cadence with RW requiring mod assist; after ambulating several feet without loss of balance patient stops with blank expression on her face, asked if she feels good/bad, she states bad, Patient instructed to return to chair requiring mod assist for turning then requires max assist to get back to chair several feet away, patient forward facing on chair requires PT and nursing to turn patient back to seated in chair  Stairs            Wheelchair Mobility    Modified Rankin (Stroke Patients Only)       Balance Overall  balance assessment: Needs assistance Sitting-balance support: No upper extremity supported;Feet supported Sitting balance-Leahy Scale: Fair Sitting balance - Comments: seated EOB     Standing balance-Leahy Scale: Poor Standing balance comment:  with RW                             Pertinent Vitals/Pain Pain Assessment: No/denies pain    Home Living Family/patient expects to be discharged to:: Private residence Living Arrangements: Non-relatives/Friends Available Help at Discharge: Friend(s);Personal care attendant Type of Home: Mobile home Home Access: Ramped entrance     Home Layout: One level Home Equipment: Rufus - 2 wheels;Cane - quad;Shower seat;Bedside commode      Prior Function Level of Independence: Needs assistance   Gait / Transfers Assistance Needed: household ambulator with AD  ADL's / Homemaking Assistance Needed: nursing aid assists        Hand Dominance        Extremity/Trunk Assessment   Upper Extremity Assessment Upper Extremity Assessment: Generalized weakness    Lower Extremity Assessment Lower Extremity Assessment: Generalized weakness       Communication   Communication: Expressive difficulties  Cognition Arousal/Alertness: Awake/alert Behavior During Therapy: Flat affect Overall Cognitive Status: History of cognitive impairments - at baseline                                        General Comments      Exercises     Assessment/Plan    PT Assessment Patient needs continued PT services  PT Problem List Decreased strength;Decreased mobility;Decreased activity tolerance;Decreased balance;Decreased knowledge of use of DME       PT Treatment Interventions DME instruction;Therapeutic exercise;Gait training;Balance training;Stair training;Neuromuscular re-education;Functional mobility training;Therapeutic activities;Patient/family education    PT Goals (Current goals can be found in the Care Plan section)  Acute Rehab PT Goals Patient Stated Goal: return home PT Goal Formulation: With patient Time For Goal Achievement: 04/18/20 Potential to Achieve Goals: Fair    Frequency Min 3X/week   Barriers to discharge        Co-evaluation                AM-PAC PT "6 Clicks" Mobility  Outcome Measure Help needed turning from your back to your side while in a flat bed without using bedrails?: A Little Help needed moving from lying on your back to sitting on the side of a flat bed without using bedrails?: A Little Help needed moving to and from a bed to a chair (including a wheelchair)?: A Lot Help needed standing up from a chair using your arms (e.g., wheelchair or bedside chair)?: A Lot Help needed to walk in hospital room?: A Lot Help needed climbing 3-5 steps with a railing? : Total 6 Click Score: 13    End of Session Equipment Utilized During Treatment: Gait belt Activity Tolerance: Patient limited by fatigue Patient left: in bed;with call bell/phone within reach;with bed alarm set Nurse Communication: Mobility status PT Visit Diagnosis: Unsteadiness on feet (R26.81);Other abnormalities of gait and mobility (R26.89);Muscle weakness (generalized) (M62.81)    Time: 0347-4259 PT Time Calculation (min) (ACUTE ONLY): 25 min   Charges:   PT Evaluation $PT Eval Moderate Complexity: 1 Mod PT Treatments $Therapeutic Activity: 8-22 mins        2:26 PM, 04/04/20 Mearl Latin PT, DPT Physical Therapist at [facility]

## 2020-04-04 NOTE — Care Management Important Message (Signed)
Important Message  Patient Details  Name: [name] MRN: [id number] Date of Birth: Aug 20, 1956   Medicare Important Message Given:  Yes     Tommy Medal 04/04/2020, 4:29 PM

## 2020-04-04 NOTE — Progress Notes (Signed)
PROGRESS NOTE    [name]  GMW:[id number] DOB: 1956-04-20 DOA: 04/02/2020 PCP: Avon Gully, MD   Brief Narrative:  Per HPI: Lauren Elbert Ewings Dawkinsis a 64 y.o.femalewith medical history significant forprior CVA with noted aneurysmal clipping at the age of 63, seizure disorder, hypertension, ongoing alcohol abuse, andGERD, who presented to the ED with some complaints ofnot acting per her usual baseline. This was noted by her home health aide as well as her friend whom she lives with. 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 There were no signs of any seizure activity noted. Additionally, there is no new noted facial droop, dysarthria, headache, visual disturbances, or any other findings that were noted.  ED Course:Stable vital signs noted with some pancytopenia noted on lab work. Serum sodium level is 121. Serum bicarbonate is 19. Urine analysis negative for any acute findings. CT of the head as well as CT angiogram with no acute findings.Aneurysmal clips noted. Patient responds well to questioning and appears alert and oriented x3. She is able to respond with meaningful answers to questioning.  -Hyponatremia in mentation improving.  Continue IV fluid and fluid restriction.  No sign of SIADH noted and TSH 1.203. PT recommending SNF on discharge.  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 as it is described that she drinks approximately 2-3 beers per day -Appears euvolemic -TSH within normal limits -No sign of SIADH noted -Normal saline bolus given in ED and will maintain on gentle normal saline x8hours for now -Sodium level up to 134 -Maintain on fluid restriction  Chronic alcohol use -Maintain on CIWA  precautions -Blood alcohol level negative -Reportedly drinks 2-3 cans of beer daily -Discussed cessation  Pancytopenia-stable -Likely secondary to chronic alcohol abuse -Continue to monitor repeat CBC -Avoid heparin agents  History of prior CVA with aneurysmal clipping and associated seizures -CT imaging with no acute findings -MRI cannot be performed due to aneurysmal clip -Continue home antiepileptics of phenobarbital and phenytoin  History of GERD -Continue PPI  History of hypertension-controlled -Continue lisinopril   DVT prophylaxis: SCDs Code Status: Full code Family Communication: Discussed with friend on 6/16 Disposition Plan:  Continue to monitor sodium levels with fluid restriction. Anticipate discharge to SNF. Status is: Inpatient  Remains inpatient appropriate because:IV treatments appropriate due to intensity of illness or inability to take PO   Dispo: The patient is from: Home  Anticipated d/c is to: Home  Anticipated d/c date is: 2-3 day  Patient currently is not medically stable to d/c. Awaiting placement to facility.   Consultants:   None  Procedures:   See below  Antimicrobials:   None  Subjective: Patient seen and evaluated today with no new acute complaints or concerns. No acute concerns or events noted overnight.  Objective: Vitals:   04/04/20 0408 04/04/20 0637 04/04/20 0800 04/04/20 1301  BP: (!) 92/52 (!) 99/55  (!) 130/54  Pulse:  85  88  Resp: 16 20  18   Temp: 98.3 F (36.8 C) 98 F (36.7 C)  98.7 F (37.1 C)  TempSrc: Oral Oral  Oral  SpO2:  100% 98% 100%  Weight:      Height:        Intake/Output Summary (Last 24 hours) at 04/04/2020 1431 Last data filed at 04/04/2020 1300  Gross per 24 hour  Intake 360 ml  Output 1200 ml  Net -840 ml   Filed Weights   04/02/20 0903 04/02/20 2029  Weight: 74.8 kg 73.3 kg    Examination:  General exam: Appears calm and  comfortable  Respiratory system: Clear to auscultation. Respiratory effort normal. Cardiovascular system: S1 & S2 heard, RRR. No JVD, murmurs, rubs, gallops or clicks. No pedal edema. Gastrointestinal system: Abdomen is nondistended, soft and nontender. No organomegaly or masses felt. Normal bowel sounds heard. Central nervous system: Alert and oriented. No focal neurological deficits. Extremities: Symmetric 5 x 5 power. Right upper extremity contracture. Skin: No rashes, lesions or ulcers Psychiatry: Judgement and insight appear normal. Mood & affect appropriate.     Data Reviewed: I have personally reviewed following labs and imaging studies  CBC: Recent Labs  Lab 04/02/20 1031 04/03/20 0313  WBC 3.0* 3.2*  NEUTROABS 1.6*  --   HGB 10.5* 9.2*  HCT 31.8* 27.9*  MCV 83.5 85.3  PLT 101* 109*   Basic Metabolic Panel: Recent Labs  Lab 04/02/20 2250 04/03/20 0313 04/03/20 0745 04/03/20 1315 04/04/20 0505  NA 127* 127* 130* 132* 134*  K 4.3 4.6 4.5 4.4 4.1  CL 95* 98 100 103 102  CO2 20* 21* 20* 20* 22  GLUCOSE 108* 108* 107* 122* 106*  BUN 11 13 11 10 11   CREATININE 0.84 0.83 0.83 0.79 0.73  CALCIUM 9.0 8.6* 8.7* 8.8* 8.7*  MG  --  1.9  --   --   --    GFR: Estimated Creatinine Clearance: 70.6 mL/min (by C-G formula based on SCr of 0.73 mg/dL). Liver Function Tests: Recent Labs  Lab 04/02/20 1031  AST 67*  ALT 37  ALKPHOS 93  BILITOT 0.8  PROT 9.0*  ALBUMIN 4.4   No results for input(s): LIPASE, AMYLASE in the last 168 hours. Recent Labs  Lab 04/02/20 1349  AMMONIA 19   Coagulation Profile: No results for input(s): INR, PROTIME in the last 168 hours. Cardiac Enzymes: No results for input(s): CKTOTAL, CKMB, CKMBINDEX, TROPONINI in the last 168 hours. BNP (last 3 results) No results for input(s): PROBNP in the last 8760 hours. HbA1C: No results for input(s): HGBA1C in the last 72 hours. CBG: No results for input(s): GLUCAP in the last 168 hours. Lipid  Profile: No results for input(s): CHOL, HDL, LDLCALC, TRIG, CHOLHDL, LDLDIRECT in the last 72 hours. Thyroid Function Tests: Recent Labs    04/02/20 1031  TSH 1.203   Anemia Panel: No results for input(s): VITAMINB12, FOLATE, FERRITIN, TIBC, IRON, RETICCTPCT in the last 72 hours. Sepsis Labs: No results for input(s): PROCALCITON, LATICACIDVEN in the last 168 hours.  No results found for this or any previous visit (from the past 240 hour(s)).       Radiology Studies: No results found.      Scheduled Meds: . darifenacin  7.5 mg Oral Daily  . feeding supplement (ENSURE ENLIVE)  237 mL Oral TID BM  . folic acid  1 mg Oral Daily  . linaclotide  145 mcg Oral QAC breakfast  . multivitamin with minerals  1 tablet Oral Daily  . naproxen  500 mg Oral BID WC  . pantoprazole  40 mg Oral BID AC  . PHENobarbital  32.4 mg Oral BID  . phenytoin  100 mg Oral TID  . pneumococcal 23 valent vaccine  0.5 mL Intramuscular Tomorrow-1000  . sucralfate  1 g Oral TID WC & HS  . thiamine  100  mg Oral Daily    LOS: 2 days    Time spent: 30 minutes    Summer Parthasarathy Hoover Brunette, DO [facility]  If 7PM-7AM, please contact night-coverage [email or web address] 04/04/2020, 2:31 PM

## 2020-04-16 ENCOUNTER — Ambulatory Visit: Payer: Medicare Other | Admitting: Nurse Practitioner

## 2020-04-16 ENCOUNTER — Encounter: Payer: Self-pay | Admitting: Nurse Practitioner

## 2020-04-16 NOTE — Progress Notes (Deleted)
Referring Provider: [name], MD Primary Care Physician:  [name], MD Primary GI:  Dr. Gala Romney (in the absence of Dr. [name]); pending Dr. Abbey Chatters  No chief complaint on file.   HPI:   [name] is a 64 y.o. female who presents for follow-up on GERD and diarrhea.  The patient was last seen in our office 11/01/2019 for GERD, odynophagia, and others.  Noted history of CVA.  Having difficulty with ambulation since hospital admission in December 2020 for rectal bleeding, feels weak daily and unsteady.  Requires an aide to assist in ambulation despite use of a cane.  Having throat pain and left arm giving her trouble, ran out of her "stomach pills".  Having constipation, odynophagia since December 2020.  Has heartburn every day.  Uses Zofran every 6 hours but ran out of Protonix that morning.  Hematochezia noted x1 episode.  CT of the abdomen and pelvis in January 2021 with no acute process.  No other overt GI complaints.  Query possible strep throat.  Recommended seen by primary care ASAP.  Was given viscous lidocaine and salt water gargles as well as Hycet every 4 hours as needed.  See ENT for throat pain if no resolution in 2 weeks.  Follow-up in 6 months.  The patient was subsequently sent to the emergency room, likely by primary care, where she was admitted to the hospital from 11/01/2019 through 11/05/2019.  She presented with abdominal pain and hematemesis with hemoglobin 11.5 that dropped to 9.4 the following day.  GI was consulted but no procedure completed due to self-limiting hematemesis, did not require transfusion.  Treated with PPI and Carafate.  Just prior to discharge she had another episode of vomiting but did not have any hematemesis; she attributed the vomiting to drinking her coffee or orange juice too quickly.  Discharge hemoglobin noted to be 9.0.  The patient was a no-show to her follow-up office visit on 12/18/2019 for post hospitalization.  The patient had been  referred to ENT, but the specialist office called [place] saying the patient canceled her appointment again and they would declined to make further appointments for the patient.  She did receive a letter from [place] in June that it was recall time for ultrasound.  The CT completed 10/23/2019 did note diverticulosis without diverticulitis, mild cirrhotic changes.  The recall ultrasound is likely related to this.  Today she states   Past Medical History:  Diagnosis Date  . Alcohol abuse   . Asthma   . Contracture of muscle of hand    right  . GERD (gastroesophageal reflux disease)   . HTN (hypertension)   . PUD (peptic ulcer disease)    remote  . Seizures ([place])    since stroke, no recent seizures  . Stroke [facility])    age 41, required brain surgery    Past Surgical History:  Procedure Laterality Date  . ABDOMINAL HYSTERECTOMY  2004   complete  . BRAIN SURGERY     age 74, stroke  . COLONOSCOPY    . COLONOSCOPY WITH PROPOFOL  10/03/2012   Dr. [name]: moderate diverticulosis, small internal hemorrhoids, TUBULAR ADENOMA. Next colonoscopy 2023 per Dr. [name].  . ESOPHAGOGASTRODUODENOSCOPY N/A 10/18/2014   Dr. Gala Romney during inpatient hospitalization: severe exudative/erosive reflux esophagitis as source of trivial upper GI bleed. No varices   . ESOPHAGOGASTRODUODENOSCOPY (EGD) WITH PROPOFOL  10/03/2012   Dr. [name]: stricture at [place] junction s/p dilation, mild gastritis negative H.pylori   . ESOPHAGOGASTRODUODENOSCOPY (EGD)  WITH PROPOFOL N/A 12/27/2017   Grade D esophagitis. Medium-sized hiatal hernia, normal duodenum  . right tib-fib fracture  2008  . SAVORY DILATION  10/03/2012   Procedure: SAVORY DILATION;  Surgeon: Danie Binder, MD;  Location: [facility];  Service: Endoscopy;  Laterality: N/A;  started at 1303,  dilated 12.8-16mm    Current Outpatient Medications  Medication Sig Dispense Refill  . acetaminophen (TYLENOL) 325 MG tablet Take 325-650 mg by mouth every 6 (six) hours as needed.    Marland Kitchen  albuterol (PROVENTIL HFA;VENTOLIN HFA) 108 (90 BASE) MCG/ACT inhaler Inhale 2 puffs into the lungs every 6 (six) hours as needed for wheezing or shortness of breath.     Marland Kitchen albuterol (PROVENTIL) (2.5 MG/3ML) 0.083% nebulizer solution Take 2.5 mg by nebulization every 6 (six) hours as needed for wheezing or shortness of breath. (Patient not taking: Reported on 04/02/2020)    . chlordiazePOXIDE (LIBRIUM) 10 MG capsule Take 1 capsule (10 mg total) by mouth 3 (three) times daily as needed for anxiety or withdrawal. 20 capsule 0  . dicyclomine (BENTYL) 20 MG tablet Take 1 tablet (20 mg total) by mouth every 6 (six) hours as needed for spasms. 12 tablet 0  . ENSURE PLUS (ENSURE PLUS) LIQD Take 237 mLs by mouth 3 (three) times daily between meals.    . folic acid (FOLVITE) 1 MG tablet Take 1 tablet (1 mg total) by mouth daily. 30 tablet 0  . HYDROcodone-acetaminophen (HYCET) 7.5-325 mg/15 ml solution TAKE 10MLS BY MOUTH 4 TIMES DAILY AS NEEDED. 120 mL 0  . ipratropium-albuterol (DUONEB) 0.5-2.5 (3) MG/3ML SOLN Take 3 mLs by nebulization every 6 (six) hours as needed (for shortness of breath/wheezing).     Marland Kitchen linaclotide (LINZESS) 145 MCG CAPS capsule Take 145 mcg by mouth daily before breakfast.    . lisinopril (ZESTRIL) 40 MG tablet Take 1 tablet (40 mg total) by mouth daily. 30 tablet 2  . Multiple Vitamin (MULTIVITAMIN WITH MINERALS) TABS tablet Take 1 tablet by mouth daily. 30 tablet 1  . naproxen (NAPROSYN) 500 MG tablet TAKE (1) TABLET BY MOUTH TWICE DAILY WITH A MEAL. 60 tablet 5  . ondansetron (ZOFRAN) 4 MG tablet Take 1 tablet (4 mg total) by mouth every 8 (eight) hours as needed for nausea or vomiting. 30 tablet 0  . pantoprazole (PROTONIX) 40 MG tablet Take 1 tablet (40 mg total) by mouth 2 (two) times daily before a meal. 60 tablet 0  . PHENobarbital (LUMINAL) 32.4 MG tablet Take 32.4 mg by mouth 2 (two) times daily.    . phenytoin (DILANTIN) 100 MG ER capsule Take 100 mg by mouth 3 (three) times  daily.    . solifenacin (VESICARE) 5 MG tablet Take 5 mg by mouth daily.    . sucralfate (CARAFATE) 1 GM/10ML suspension Take 10 mLs (1 g total) by mouth 4 (four) times daily -  with meals and at bedtime. 420 mL 0  . thiamine 100 MG tablet Take 1 tablet (100 mg total) by mouth daily. 30 tablet 1   No current facility-administered medications for this visit.    Allergies as of 04/16/2020  . (No Known Allergies)    Family History  Problem Relation Age of Onset  . Liver disease Sister        etoh  . Colon cancer Neg Hx     Social History   Socioeconomic History  . Marital status: Widowed    Spouse name: Not on file  . Number of children:  3  . Years of education: Not on file  . Highest education level: Not on file  Occupational History  . Occupation: disability    Employer: UNEMPLOYED  Tobacco Use  . Smoking status: Former Smoker    Packs/day: 0.04    Years: 9.00    Pack years: 0.36    Types: Cigarettes    Quit date: 07/28/2014    Years since quitting: 5.7  . Smokeless tobacco: Never Used  Vaping Use  . Vaping Use: Never used  Substance and Sexual Activity  . Alcohol use: Not Currently    Alcohol/week: 35.0 standard drinks    Types: 35 Cans of beer per week    Comment: 12 pack of beer twice per week; denied 11/01/19  . Drug use: No  . Sexual activity: Yes    Birth control/protection: Surgical  Other Topics Concern  . 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 HAS 3 KIDS: ONE IN [place], ONE IN [place], AND ONE OOT.   Social Determinants of Health   Financial Resource Strain:   . Difficulty of Paying Living Expenses:   Food Insecurity:   . Worried About Charity fundraiser in the Last Year:   . Arboriculturist in the Last Year:   Transportation Needs:   . Film/video editor (Medical):   Marland Kitchen Lack of Transportation (Non-Medical):   Physical Activity:   . Days of Exercise per Week:   . Minutes of Exercise per Session:   Stress:   .  Feeling of Stress :   Social Connections:   . Frequency of Communication with Friends and Family:   . Frequency of Social Gatherings with Friends and Family:   . Attends Religious Services:   . Active Member of Clubs or Organizations:   . Attends Archivist Meetings:   Marland Kitchen Marital Status:     Subjective: Review of Systems  Constitutional: Negative for chills, fever, malaise/fatigue and weight loss.  HENT: Negative for congestion and sore throat.   Respiratory: Negative for cough and shortness of breath.   Cardiovascular: Negative for chest pain and palpitations.  Gastrointestinal: Negative for abdominal pain, blood in stool, diarrhea, melena, nausea and vomiting.  Musculoskeletal: Negative for joint pain and myalgias.  Skin: Negative for rash.  Neurological: Negative for dizziness and weakness.  Endo/Heme/Allergies: Does not bruise/bleed easily.  Psychiatric/Behavioral: Negative for depression. The patient is not nervous/anxious.   All other systems reviewed and are negative.    Objective: There were no vitals taken for this visit. Physical Exam Vitals and nursing note reviewed.  Constitutional:      General: She is not in acute distress.    Appearance: Normal appearance. She is well-developed. She is not ill-appearing, toxic-appearing or diaphoretic.  HENT:     Head: Normocephalic and atraumatic.     Nose: No congestion or rhinorrhea.  Eyes:     General: No scleral icterus. Cardiovascular:     Rate and Rhythm: Normal rate and regular rhythm.     Heart sounds: Normal heart sounds.  Pulmonary:     Effort: Pulmonary effort is normal. No respiratory distress.     Breath sounds: Normal breath sounds.  Abdominal:     General: Bowel sounds are normal.     Palpations: Abdomen is soft. There is no hepatomegaly, splenomegaly or mass.     Tenderness: There is no abdominal tenderness. There is no guarding or rebound.  Hernia: No hernia is present.  Skin:    General:  Skin is warm and dry.     Coloration: Skin is not jaundiced.     Findings: No rash.  Neurological:     General: No focal deficit present.     Mental Status: She is alert and oriented to person, place, and time.  Psychiatric:        Attention and Perception: Attention normal.        Mood and Affect: Mood normal.        Speech: Speech normal.        Behavior: Behavior normal.        Thought Content: Thought content normal.        Cognition and Memory: Cognition and memory normal.       04/16/2020 7:54 AM   Disclaimer: This note was dictated with voice recognition software. Similar sounding words can inadvertently be transcribed and may not be corrected upon review.

## 2020-06-17 ENCOUNTER — Ambulatory Visit: Payer: Medicare Other | Admitting: Nurse Practitioner

## 2020-06-17 ENCOUNTER — Telehealth: Payer: Self-pay | Admitting: Internal Medicine

## 2020-06-17 NOTE — Telephone Encounter (Signed)
PATIENT  NO SHOW X 3 SINCE MARCH 2021

## 2020-06-17 NOTE — Progress Notes (Deleted)
Referring Provider: [name], MD Primary Care Physician:  [name], MD Primary GI:  Dr. Abbey Chatters  No chief complaint on file.   HPI:   [name] is a 65 y.o. female who presents for follow-up on GERD and diarrhea. The patient was last seen in our office 11/01/2019 for CVA, seizures, odynophagia, GERD, sleep disturbance.  Noted history of right hand contracture and stroke with difficulty ambulating since hospital admission in December 2020 for rectal bleeding.  Persistent weakness, unsteady gait.  Occasional falls but without injury.  At her last visit she noted throat pain and "ran out of her stomach pills" and more persistent constipation.  No vomiting.  Has daily heartburn, out of Protonix as of this morning.  Zofran every 6 hours.  1 episode of hematochezia noted per the patient.  Admits she is not really eating much/very well which could be contributing to her constipation.  Recent CT of the abdomen and pelvis in January 2021 with no acute intra-abdominal process.  No other overt GI complaints.  Recommended referral back to primary care ASAP for strep throat for possible throat pain, viscous lidocaine was prescribed, salt water gargles.  If no resolution in 2 weeks, refer to ENT.  Continue Protonix twice daily, Zofran as needed.  Follow-up in 6 months.  The patient was referred to ENT, but canceled multiple appointments and subsequently ENT indicated they would not make further appointments for her.  On 03/11/2020 noted on recall for ultrasound.  This appears to have been ordered, but not completed.  Today she states she is doing okay overall.   Past Medical History:  Diagnosis Date  . Alcohol abuse   . Asthma   . Contracture of muscle of hand    right  . GERD (gastroesophageal reflux disease)   . HTN (hypertension)   . PUD (peptic ulcer disease)    remote  . Seizures ([place])    since stroke, no recent seizures  . Stroke [facility])    age 2, required brain surgery     Past Surgical History:  Procedure Laterality Date  . ABDOMINAL HYSTERECTOMY  2004   complete  . BRAIN SURGERY     age 89, stroke  . COLONOSCOPY    . COLONOSCOPY WITH PROPOFOL  10/03/2012   Dr. [name]: moderate diverticulosis, small internal hemorrhoids, TUBULAR ADENOMA. Next colonoscopy 2023 per Dr. [name].  . ESOPHAGOGASTRODUODENOSCOPY N/A 10/18/2014   Dr. Gala Romney during inpatient hospitalization: severe exudative/erosive reflux esophagitis as source of trivial upper GI bleed. No varices   . ESOPHAGOGASTRODUODENOSCOPY (EGD) WITH PROPOFOL  10/03/2012   Dr. [name]: stricture at [place] junction s/p dilation, mild gastritis negative H.pylori   . ESOPHAGOGASTRODUODENOSCOPY (EGD) WITH PROPOFOL N/A 12/27/2017   Grade D esophagitis. Medium-sized hiatal hernia, normal duodenum  . right tib-fib fracture  2008  . SAVORY DILATION  10/03/2012   Procedure: SAVORY DILATION;  Surgeon: Danie Binder, MD;  Location: [facility];  Service: Endoscopy;  Laterality: N/A;  started at 1303,  dilated 12.8-16mm    Current Outpatient Medications  Medication Sig Dispense Refill  . acetaminophen (TYLENOL) 325 MG tablet Take 325-650 mg by mouth every 6 (six) hours as needed.    Marland Kitchen albuterol (PROVENTIL HFA;VENTOLIN HFA) 108 (90 BASE) MCG/ACT inhaler Inhale 2 puffs into the lungs every 6 (six) hours as needed for wheezing or shortness of breath.     Marland Kitchen albuterol (PROVENTIL) (2.5 MG/3ML) 0.083% nebulizer solution Take 2.5 mg by nebulization every 6 (six) hours as needed  for wheezing or shortness of breath. (Patient not taking: Reported on 04/02/2020)    . chlordiazePOXIDE (LIBRIUM) 10 MG capsule Take 1 capsule (10 mg total) by mouth 3 (three) times daily as needed for anxiety or withdrawal. 20 capsule 0  . dicyclomine (BENTYL) 20 MG tablet Take 1 tablet (20 mg total) by mouth every 6 (six) hours as needed for spasms. 12 tablet 0  . ENSURE PLUS (ENSURE PLUS) LIQD Take 237 mLs by mouth 3 (three) times daily between meals.    .  folic acid (FOLVITE) 1 MG tablet Take 1 tablet (1 mg total) by mouth daily. 30 tablet 0  . HYDROcodone-acetaminophen (HYCET) 7.5-325 mg/15 ml solution TAKE 10MLS BY MOUTH 4 TIMES DAILY AS NEEDED. 120 mL 0  . ipratropium-albuterol (DUONEB) 0.5-2.5 (3) MG/3ML SOLN Take 3 mLs by nebulization every 6 (six) hours as needed (for shortness of breath/wheezing).     Marland Kitchen linaclotide (LINZESS) 145 MCG CAPS capsule Take 145 mcg by mouth daily before breakfast.    . lisinopril (ZESTRIL) 40 MG tablet Take 1 tablet (40 mg total) by mouth daily. 30 tablet 2  . Multiple Vitamin (MULTIVITAMIN WITH MINERALS) TABS tablet Take 1 tablet by mouth daily. 30 tablet 1  . naproxen (NAPROSYN) 500 MG tablet TAKE (1) TABLET BY MOUTH TWICE DAILY WITH A MEAL. 60 tablet 5  . ondansetron (ZOFRAN) 4 MG tablet Take 1 tablet (4 mg total) by mouth every 8 (eight) hours as needed for nausea or vomiting. 30 tablet 0  . pantoprazole (PROTONIX) 40 MG tablet Take 1 tablet (40 mg total) by mouth 2 (two) times daily before a meal. 60 tablet 0  . PHENobarbital (LUMINAL) 32.4 MG tablet Take 32.4 mg by mouth 2 (two) times daily.    . phenytoin (DILANTIN) 100 MG ER capsule Take 100 mg by mouth 3 (three) times daily.    . solifenacin (VESICARE) 5 MG tablet Take 5 mg by mouth daily.    . sucralfate (CARAFATE) 1 GM/10ML suspension Take 10 mLs (1 g total) by mouth 4 (four) times daily -  with meals and at bedtime. 420 mL 0  . thiamine 100 MG tablet Take 1 tablet (100 mg total) by mouth daily. 30 tablet 1   No current facility-administered medications for this visit.    Allergies as of 06/17/2020  . (No Known Allergies)    Family History  Problem Relation Age of Onset  . Liver disease Sister        etoh  . Colon cancer Neg Hx     Social History   Socioeconomic History  . Marital status: Widowed    Spouse name: Not on file  . Number of children: 3  . Years of education: Not on file  . Highest education level: Not on file  Occupational  History  . Occupation: disability    Employer: UNEMPLOYED  Tobacco Use  . Smoking status: Former Smoker    Packs/day: 0.04    Years: 9.00    Pack years: 0.36    Types: Cigarettes    Quit date: 07/28/2014    Years since quitting: 5.8  . Smokeless tobacco: Never Used  Vaping Use  . Vaping Use: Never used  Substance and Sexual Activity  . Alcohol use: Not Currently    Alcohol/week: 35.0 standard drinks    Types: 35 Cans of beer per week    Comment: 12 pack of beer twice per week; denied 11/01/19  . Drug use: No  . Sexual activity: Yes  Birth control/protection: Surgical  Other Topics Concern  . 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 HAS 3 KIDS: ONE IN [place], ONE IN [place], AND ONE OOT.   Social Determinants of Health   Financial Resource Strain:   . Difficulty of Paying Living Expenses: Not on file  Food Insecurity:   . Worried About Charity fundraiser in the Last Year: Not on file  . Ran Out of Food in the Last Year: Not on file  Transportation Needs:   . Lack of Transportation (Medical): Not on file  . Lack of Transportation (Non-Medical): Not on file  Physical Activity:   . Days of Exercise per Week: Not on file  . Minutes of Exercise per Session: Not on file  Stress:   . Feeling of Stress : Not on file  Social Connections:   . Frequency of Communication with Friends and Family: Not on file  . Frequency of Social Gatherings with Friends and Family: Not on file  . Attends Religious Services: Not on file  . Active Member of Clubs or Organizations: Not on file  . Attends Archivist Meetings: Not on file  . Marital Status: Not on file    Subjective: Review of Systems  Constitutional: Negative for chills, fever, malaise/fatigue and weight loss.  HENT: Negative for congestion and sore throat.   Respiratory: Negative for cough and shortness of breath.   Cardiovascular: Negative for chest pain and palpitations.    Gastrointestinal: Negative for abdominal pain, blood in stool, diarrhea, melena, nausea and vomiting.  Musculoskeletal: Negative for joint pain and myalgias.  Skin: Negative for rash.  Neurological: Negative for dizziness and weakness.  Endo/Heme/Allergies: Does not bruise/bleed easily.  Psychiatric/Behavioral: Negative for depression. The patient is not nervous/anxious.   All other systems reviewed and are negative.    Objective: There were no vitals taken for this visit. Physical Exam Vitals and nursing note reviewed.  Constitutional:      General: She is not in acute distress.    Appearance: Normal appearance. She is well-developed. She is not ill-appearing, toxic-appearing or diaphoretic.  HENT:     Head: Normocephalic and atraumatic.     Nose: No congestion or rhinorrhea.  Eyes:     General: No scleral icterus. Cardiovascular:     Rate and Rhythm: Normal rate and regular rhythm.     Heart sounds: Normal heart sounds.  Pulmonary:     Effort: Pulmonary effort is normal. No respiratory distress.     Breath sounds: Normal breath sounds.  Abdominal:     General: Bowel sounds are normal.     Palpations: Abdomen is soft. There is no hepatomegaly, splenomegaly or mass.     Tenderness: There is no abdominal tenderness. There is no guarding or rebound.     Hernia: No hernia is present.  Skin:    General: Skin is warm and dry.     Coloration: Skin is not jaundiced.     Findings: No rash.  Neurological:     General: No focal deficit present.     Mental Status: She is alert and oriented to person, place, and time.  Psychiatric:        Attention and Perception: Attention normal.        Mood and Affect: Mood normal.        Speech: Speech normal.        Behavior: Behavior normal.  Thought Content: Thought content normal.        Cognition and Memory: Cognition and memory normal.      Assessment:  ***   Plan: ***      06/17/2020 1:14 PM   Disclaimer: This  note was dictated with voice recognition software. Similar sounding words can inadvertently be transcribed and may not be corrected upon review.

## 2020-06-18 NOTE — Telephone Encounter (Signed)
Noted  

## 2020-06-24 ENCOUNTER — Ambulatory Visit: Payer: Medicare Other | Admitting: Orthopaedic Surgery

## 2020-06-25 ENCOUNTER — Encounter: Payer: Self-pay | Admitting: Orthopaedic Surgery

## 2020-06-25 ENCOUNTER — Ambulatory Visit: Payer: Medicare Other | Admitting: Orthopaedic Surgery

## 2020-07-08 ENCOUNTER — Ambulatory Visit (INDEPENDENT_AMBULATORY_CARE_PROVIDER_SITE_OTHER): Payer: Medicare Other | Admitting: Orthopaedic Surgery

## 2020-07-08 ENCOUNTER — Encounter: Payer: Self-pay | Admitting: Orthopaedic Surgery

## 2020-07-08 ENCOUNTER — Other Ambulatory Visit: Payer: Self-pay

## 2020-07-08 VITALS — Ht 64.0 in | Wt 161.0 lb

## 2020-07-08 DIAGNOSIS — R531 Weakness: Secondary | ICD-10-CM

## 2020-07-08 DIAGNOSIS — G8929 Other chronic pain: Secondary | ICD-10-CM

## 2020-07-08 DIAGNOSIS — M25562 Pain in left knee: Secondary | ICD-10-CM | POA: Diagnosis not present

## 2020-07-08 MED ORDER — HYDROCODONE-ACETAMINOPHEN 5-325 MG PO TABS: ORAL_TABLET | ORAL | 0 refills | Status: DC

## 2020-07-08 NOTE — Progress Notes (Signed)
PROCEDURE NOTE:  The patient requests injections of the left knee , verbal consent was obtained.  The left knee was prepped appropriately after time out was performed.   Sterile technique was observed and injection of 1 cc of Depo-Medrol 40 mg with several cc's of plain xylocaine. Anesthesia was provided by ethyl chloride and a 20-gauge needle was used to inject the knee area. The injection was tolerated well.  A band aid dressing was applied.  The patient was advised to apply ice later today and tomorrow to the injection sight as needed.  I have reviewed the [facility] web site prior to prescribing narcotic medicine for this patient.   I will see as needed.  Call if any problem.  Precautions discussed.   Electronically Signed Sanjuana Kava, MD 9/21/20219:23 AM

## 2020-07-11 ENCOUNTER — Ambulatory Visit (INDEPENDENT_AMBULATORY_CARE_PROVIDER_SITE_OTHER): Payer: Medicare Other | Admitting: Gastroenterology

## 2020-07-11 ENCOUNTER — Other Ambulatory Visit: Payer: Self-pay

## 2020-07-11 ENCOUNTER — Encounter: Payer: Self-pay | Admitting: *Deleted

## 2020-07-11 ENCOUNTER — Encounter: Payer: Self-pay | Admitting: Gastroenterology

## 2020-07-11 ENCOUNTER — Telehealth: Payer: Self-pay | Admitting: *Deleted

## 2020-07-11 VITALS — BP 132/70 | HR 86 | Temp 98.2°F | Ht 64.0 in | Wt 162.4 lb

## 2020-07-11 DIAGNOSIS — K2100 Gastro-esophageal reflux disease with esophagitis, without bleeding: Secondary | ICD-10-CM | POA: Diagnosis not present

## 2020-07-11 DIAGNOSIS — R197 Diarrhea, unspecified: Secondary | ICD-10-CM | POA: Diagnosis not present

## 2020-07-11 DIAGNOSIS — K625 Hemorrhage of anus and rectum: Secondary | ICD-10-CM

## 2020-07-11 DIAGNOSIS — K7460 Unspecified cirrhosis of liver: Secondary | ICD-10-CM | POA: Diagnosis not present

## 2020-07-11 DIAGNOSIS — F1010 Alcohol abuse, uncomplicated: Secondary | ICD-10-CM

## 2020-07-11 NOTE — Telephone Encounter (Signed)
Patient care taker returned call. She is aware of appt details for [place]. Patient procedure scheduled for 11/30. Aware will mail instructions with pre-op/covid test appt. Confirmed mailing address.

## 2020-07-11 NOTE — Telephone Encounter (Signed)
RUQ [place] scheduled for 9/29 at 8:30am, arrival 8:15am, npo midnight Pt also needs TCS with Dr. Abbey Chatters, ASA 3  Called pt, LMOVM

## 2020-07-11 NOTE — Patient Instructions (Addendum)
1. Please go for labs. We will contact you with results as available.  2. Colonoscopy to be scheduled. See separate instructions.  3. Ultrasound of your liver to be scheduled.  4. Continue pantoprazole 40mg  twice daily before breakfast and evening meal.  5. Continue dicyclomine 20mg  up to three times daily as needed to control diarrhea.  6. Use Linzess ONLY if you have not had a bowel movement in 24 hours. HOLD when you are having diarrhea.

## 2020-07-11 NOTE — Progress Notes (Signed)
Primary Care Physician: [name], MD  Primary Gastroenterologist:  Elon Alas. Abbey Chatters, DO   Chief Complaint  Patient presents with  . Rectal Bleeding    rectal bleeding x2 episodes, diarrhea    HPI: [name] is a 64 y.o. female here for further evaluation of rectal bleeding.  She has a history of reflux esophagitis, alcoholic cirrhosis.   Last imaging of the liver January 2021 with mild cirrhotic changes.  Spleen normal in size.  EGD March 2019 with grade D esophagitis, medium sized hiatal hernia.  No esophageal varices. Last colonoscopy 09/2012 moderate diverticulosis, tubular adenoma removed. Random colon bx negative.  Patient presents with her aide today.  Has noticed rectal bleeding a couple of times of the past 2 days.  Small-volume, a little bit in the stool and on the tissue.  No melena.  Has been having issues with diarrhea again.  She has a chronic constipation but has periods of intermittent diarrhea.  States that she has not been taking her Linzess for some time but I cannot really tell me any specifics.  Having 4-5 stools per day for couple of days. No recent ill contacts or antibiotics. No nocturnal diarrhea.  Mild abdominal discomfort, improves with meals.  Denies heartburn or dysphagia.  No weight loss.  Aide is concerned that she has slurred speech at times and sleeps a lot.  She had a fall back in June and underwent imaging including CT head, CT angio neck and head.  No acute findings.  She continues to drink "a lot" of alcohol.  Patient tells me the last time she had any alcohol was yesterday.  Aide told her front office staff that patient was drinking this morning before her 8:30 AM appointment.  Aide notes that patient quit drinking for 2 weeks and a lot of her symptoms improved including the slurred speech and somnolence.    Current Outpatient Medications  Medication Sig Dispense Refill  . acetaminophen (TYLENOL) 325 MG tablet Take 325-650 mg by mouth  as needed.     Marland Kitchen albuterol (PROVENTIL HFA;VENTOLIN HFA) 108 (90 BASE) MCG/ACT inhaler Inhale 2 puffs into the lungs every 6 (six) hours as needed for wheezing or shortness of breath.     Marland Kitchen albuterol (PROVENTIL) (2.5 MG/3ML) 0.083% nebulizer solution Take 2.5 mg by nebulization every 6 (six) hours as needed for wheezing or shortness of breath.     . chlordiazePOXIDE (LIBRIUM) 10 MG capsule Take 1 capsule (10 mg total) by mouth 3 (three) times daily as needed for anxiety or withdrawal. 20 capsule 0  . dicyclomine (BENTYL) 20 MG tablet Take 1 tablet (20 mg total) by mouth every 6 (six) hours as needed for spasms. (Patient taking differently: Take 20 mg by mouth as needed for spasms. ) 12 tablet 0  . ENSURE PLUS (ENSURE PLUS) LIQD Take 237 mLs by mouth 3 (three) times daily between meals.    . folic acid (FOLVITE) 1 MG tablet Take 1 tablet (1 mg total) by mouth daily. 30 tablet 0  . HYDROcodone-acetaminophen (NORCO/VICODIN) 5-325 MG tablet One tablet every four hours for pain. (Patient taking differently: as needed. One tablet every four hours for pain.) 30 tablet 0  . linaclotide (LINZESS) 145 MCG CAPS capsule Take 145 mcg by mouth daily before breakfast.    . lisinopril (ZESTRIL) 40 MG tablet Take 1 tablet (40 mg total) by mouth daily. 30 tablet 2  . Multiple Vitamin (MULTIVITAMIN WITH MINERALS) TABS tablet Take 1  tablet by mouth daily. 30 tablet 1  . pantoprazole (PROTONIX) 40 MG tablet Take 1 tablet (40 mg total) by mouth 2 (two) times daily before a meal. 60 tablet 0  . PHENobarbital (LUMINAL) 32.4 MG tablet Take 32.4 mg by mouth 2 (two) times daily.    . phenytoin (DILANTIN) 100 MG ER capsule Take 100 mg by mouth 3 (three) times daily.    . solifenacin (VESICARE) 5 MG tablet Take 5 mg by mouth daily.    . sucralfate (CARAFATE) 1 GM/10ML suspension Take 10 mLs (1 g total) by mouth 4 (four) times daily -  with meals and at bedtime. 420 mL 0  . thiamine 100 MG tablet Take 1 tablet (100 mg total) by  mouth daily. 30 tablet 1   No current facility-administered medications for this visit.    Allergies as of 07/11/2020  . (No Known Allergies)   Past Medical History:  Diagnosis Date  . Alcohol abuse   . Asthma   . Contracture of muscle of hand    right  . GERD (gastroesophageal reflux disease)   . HTN (hypertension)   . PUD (peptic ulcer disease)    remote  . Seizures ([place])    since stroke, no recent seizures  . 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 ABDOMINAL HYSTERECTOMY  2004   complete  . BRAIN SURGERY     age 68, stroke  . COLONOSCOPY    . COLONOSCOPY WITH PROPOFOL  10/03/2012   Dr. [name]: moderate diverticulosis, small internal hemorrhoids, TUBULAR ADENOMA. Next colonoscopy 2023 per Dr. [name].  . ESOPHAGOGASTRODUODENOSCOPY N/A 10/18/2014   Dr. Gala Romney during inpatient hospitalization: severe exudative/erosive reflux esophagitis as source of trivial upper GI bleed. No varices   . ESOPHAGOGASTRODUODENOSCOPY (EGD) WITH PROPOFOL  10/03/2012   Dr. [name]: stricture at [place] junction s/p dilation, mild gastritis negative H.pylori   . ESOPHAGOGASTRODUODENOSCOPY (EGD) WITH PROPOFOL N/A 12/27/2017   Grade D esophagitis. Medium-sized hiatal hernia, normal duodenum  . right tib-fib fracture  2008  . SAVORY DILATION  10/03/2012   Procedure: SAVORY DILATION;  Surgeon: Danie Binder, MD;  Location: [facility];  Service: Endoscopy;  Laterality: N/A;  started at 1303,  dilated 12.8-16mm   Family History  Problem Relation Age of Onset  . Liver disease Sister        etoh  . Colon cancer Neg Hx    Social History   Tobacco Use  . Smoking status: Former Smoker    Packs/day: 0.04    Years: 9.00    Pack years: 0.36    Types: Cigarettes    Quit date: 07/28/2014    Years since quitting: 5.9  . Smokeless tobacco: Never Used  Vaping Use  . Vaping Use: Never used  Substance Use Topics  . Alcohol use: Not Currently     Alcohol/week: 35.0 standard drinks    Types: 35 Cans of beer per week    Comment: Patient reports she drinks "a lot" of beer each day (07/11/2020)  . Drug use: No    ROS:  General: Negative for anorexia, weight loss, fever, chills, fatigue, weakness.  Chronic right hand weakness ENT: Negative for hoarseness, difficulty swallowing , nasal congestion. CV: Negative for chest pain, angina, palpitations, dyspnea on exertion, peripheral edema.  Respiratory: Negative for dyspnea at rest, dyspnea on exertion, cough, sputum, wheezing.  GI: See history of present illness. GU:  Negative for dysuria,  hematuria, urinary incontinence, urinary frequency, nocturnal urination.  Endo: Negative for unusual weight change.    Physical Examination:   BP 132/70   Pulse 86   Temp 98.2 F (36.8 C) (Temporal)   Ht 5\' 4"  (1.626 m)   Wt 162 lb 6.4 oz (73.7 kg)   BMI 27.88 kg/m   General: Appears older than stated age.  No acute distress.    Eyes: No icterus. Mouth: masked. Lungs: Clear to auscultation bilaterally.  Heart: Regular rate and rhythm, no murmurs rubs or gallops.  Abdomen: Bowel sounds are normal, mild diffuse tenderness, nondistended, no hepatosplenomegaly or masses, no abdominal bruits or hernia , no rebound or guarding.  Exam limited as patient declined getting on the exam table. Extremities: No lower extremity edema. No clubbing or deformities of the lower extremities.  Hand contracture, right. Neuro: Alert and oriented x 4 no asterixis  Skin: Warm and dry, no jaundice.   Psych: Alert and cooperative, normal mood and affect.  Labs:  Lab Results  Component Value Date   CREATININE 0.73 04/04/2020   BUN 11 04/04/2020   NA 134 (L) 04/04/2020   K 4.1 04/04/2020   CL 102 04/04/2020   CO2 22 04/04/2020   Lab Results  Component Value Date   ALT 37 04/02/2020   AST 67 (H) 04/02/2020   ALKPHOS 93 04/02/2020   BILITOT 0.8 04/02/2020   Lab Results  Component Value Date   WBC 3.2 (L)  04/03/2020   HGB 9.2 (L) 04/03/2020   HCT 27.9 (L) 04/03/2020   MCV 85.3 04/03/2020   PLT 109 (L) 04/03/2020   Lab Results  Component Value Date   TSH 1.203 04/02/2020   Lab Results  Component Value Date   IRON 64 11/01/2019   TIBC 431 11/01/2019   FERRITIN 19 11/01/2019   Lab Results  Component Value Date   VITAMINB12 868 11/01/2019   Lab Results  Component Value Date   FOLATE 38.8 11/01/2019     Imaging Studies: No results found.   Impression/plan:  64 year old female with history of alcohol abuse, GERD, chronic alternating constipation and diarrhea, chronic normocytic anemia/thrombocytopenia.  Presents today with complaints of rectal bleeding.  Rectal bleeding:  Last colonoscopy in 2013.  Possibly benign anal rectal bleeding.  Appears self-limited.  Update labs today.  Recommend updating colonoscopy in the near future with Dr. Abbey Chatters. ASA III.  I have discussed the risks, alternatives, benefits with regards to but not limited to the risk of reaction to medication, bleeding, infection, perforation and the patient is agreeable to proceed. Written consent to be obtained.  Chronic intermittent diarrhea (typically predominance of constipation): Discontinue Linzess.  Do not take it unless she has not had a bowel movement in over 24 hours.  Aide does not think she is not on Linzess right now but will verify.  Encouraged use of dicyclomine 20 mg up to 3 times daily to control diarrhea.  If symptoms do not settle down they will let us know.  Evaluate at time of upcoming colonoscopy.  Consider random colon biopsies to exclude microscopic colitis.  Alcoholic cirrhosis: Update labs, right upper quadrant ultrasound for hepatoma screening.  Continue to encourage alcohol cessation.  Alcohol abuse: Likely contributing to patient's somnolence, slurred speech at times but need to consider hepatic encephalopathy.  Aide noted that the symptoms tend to improve when she is not  drinking.  Anemia: likely multifactorial in the setting of poor nutrition, bone marrow suppression with etoh abuse/cirrhosis. Update labs today.  Colonoscopy in the near future. Continue pantoprazole BID.

## 2020-07-16 ENCOUNTER — Other Ambulatory Visit: Payer: Self-pay

## 2020-07-16 ENCOUNTER — Ambulatory Visit (HOSPITAL_COMMUNITY)
Admission: RE | Admit: 2020-07-16 | Discharge: 2020-07-16 | Disposition: A | Discharge status: Home / Self Care | Payer: Medicare Other | Source: Ambulatory Visit | Attending: Gastroenterology | Admitting: Gastroenterology

## 2020-07-16 DIAGNOSIS — K7460 Unspecified cirrhosis of liver: Secondary | ICD-10-CM | POA: Diagnosis not present

## 2020-07-17 LAB — COMPREHENSIVE METABOLIC PANEL
AG Ratio: 1.2 (calc) (ref 1.0–2.5)
ALT: 28 U/L (ref 6–29)
AST: 48 U/L — ABNORMAL HIGH (ref 10–35)
Albumin: 4.7 g/dL (ref 3.6–5.1)
Alkaline phosphatase (APISO): 106 U/L (ref 37–153)
BUN: 11 mg/dL (ref 7–25)
CO2: 24 mmol/L (ref 20–32)
Calcium: 9.9 mg/dL (ref 8.6–10.4)
Chloride: 92 mmol/L — ABNORMAL LOW (ref 98–110)
Creat: 0.78 mg/dL (ref 0.50–0.99)
Globulin: 3.8 g/dL (calc) — ABNORMAL HIGH (ref 1.9–3.7)
Glucose, Bld: 102 mg/dL (ref 65–139)
Potassium: 4.2 mmol/L (ref 3.5–5.3)
Sodium: 127 mmol/L — ABNORMAL LOW (ref 135–146)
Total Bilirubin: 0.7 mg/dL (ref 0.2–1.2)
Total Protein: 8.5 g/dL — ABNORMAL HIGH (ref 6.1–8.1)

## 2020-07-17 LAB — CBC WITH DIFFERENTIAL/PLATELET
Absolute Monocytes: 414 cells/uL (ref 200–950)
Basophils Absolute: 41 cells/uL (ref 0–200)
Basophils Relative: 1.1 %
Eosinophils Absolute: 30 cells/uL (ref 15–500)
Eosinophils Relative: 0.8 %
HCT: 30.3 % — ABNORMAL LOW (ref 35.0–45.0)
Hemoglobin: 10.2 g/dL — ABNORMAL LOW (ref 11.7–15.5)
Lymphs Abs: 1184 cells/uL (ref 850–3900)
MCH: 28 pg (ref 27.0–33.0)
MCHC: 33.7 g/dL (ref 32.0–36.0)
MCV: 83.2 fL (ref 80.0–100.0)
MPV: 11.6 fL (ref 7.5–12.5)
Monocytes Relative: 11.2 %
Neutro Abs: 2031 cells/uL (ref 1500–7800)
Neutrophils Relative %: 54.9 %
Platelets: 152 10*3/uL (ref 140–400)
RBC: 3.64 10*6/uL — ABNORMAL LOW (ref 3.80–5.10)
RDW: 14.2 % (ref 11.0–15.0)
Total Lymphocyte: 32 %
WBC: 3.7 10*3/uL — ABNORMAL LOW (ref 3.8–10.8)

## 2020-07-17 LAB — PROTIME-INR
INR: 1
Prothrombin Time: 11.1 s (ref 9.0–11.5)

## 2020-07-17 LAB — AMMONIA: Ammonia: 79 umol/L — ABNORMAL HIGH (ref <=72)

## 2020-07-21 ENCOUNTER — Other Ambulatory Visit: Payer: Self-pay | Admitting: *Deleted

## 2020-07-21 ENCOUNTER — Other Ambulatory Visit: Payer: Self-pay | Admitting: Gastroenterology

## 2020-07-21 DIAGNOSIS — K7460 Unspecified cirrhosis of liver: Secondary | ICD-10-CM

## 2020-07-21 DIAGNOSIS — F1010 Alcohol abuse, uncomplicated: Secondary | ICD-10-CM

## 2020-07-21 DIAGNOSIS — K625 Hemorrhage of anus and rectum: Secondary | ICD-10-CM

## 2020-07-21 DIAGNOSIS — R197 Diarrhea, unspecified: Secondary | ICD-10-CM

## 2020-07-21 DIAGNOSIS — K2100 Gastro-esophageal reflux disease with esophagitis, without bleeding: Secondary | ICD-10-CM

## 2020-07-21 DIAGNOSIS — K7030 Alcoholic cirrhosis of liver without ascites: Secondary | ICD-10-CM

## 2020-07-21 MED ORDER — RIFAXIMIN 550 MG PO TABS: 550.0000 mg | ORAL_TABLET | Freq: Two times a day (BID) | ORAL | 5 refills | Status: DC

## 2020-07-22 ENCOUNTER — Telehealth: Payer: Self-pay | Admitting: Internal Medicine

## 2020-07-22 NOTE — Telephone Encounter (Signed)
Patient friend called and said that the medication that was called in for her is over 1000$ and she can not afford that

## 2020-07-22 NOTE — Telephone Encounter (Signed)
Submitted PA on [email or web address]. pt is aware.

## 2020-07-28 ENCOUNTER — Telehealth: Payer: Self-pay | Admitting: Internal Medicine

## 2020-07-28 NOTE — Telephone Encounter (Signed)
Elmo Putt did a PA on 18/3/35.

## 2020-07-28 NOTE — Telephone Encounter (Signed)
PATIENT CAREGIVER CALLED AND SAID THE MEDICATION WE CALLED IN IS NOT COVERED BY HER INSURANCE

## 2020-08-16 LAB — COMPREHENSIVE METABOLIC PANEL
AG Ratio: 1.3 (calc) (ref 1.0–2.5)
ALT: 21 U/L (ref 6–29)
AST: 36 U/L — ABNORMAL HIGH (ref 10–35)
Albumin: 4.5 g/dL (ref 3.6–5.1)
Alkaline phosphatase (APISO): 99 U/L (ref 37–153)
BUN: 9 mg/dL (ref 7–25)
CO2: 25 mmol/L (ref 20–32)
Calcium: 9.7 mg/dL (ref 8.6–10.4)
Chloride: 99 mmol/L (ref 98–110)
Creat: 0.7 mg/dL (ref 0.50–0.99)
Globulin: 3.6 g/dL (calc) (ref 1.9–3.7)
Glucose, Bld: 107 mg/dL — ABNORMAL HIGH (ref 65–99)
Potassium: 4.5 mmol/L (ref 3.5–5.3)
Sodium: 134 mmol/L — ABNORMAL LOW (ref 135–146)
Total Bilirubin: 0.6 mg/dL (ref 0.2–1.2)
Total Protein: 8.1 g/dL (ref 6.1–8.1)

## 2020-08-26 ENCOUNTER — Telehealth: Payer: Self-pay | Admitting: Orthopaedic Surgery

## 2020-08-26 NOTE — Telephone Encounter (Signed)
Patient requests refill  HYDROcodone-acetaminophen (NORCO/VICODIN) 5-325 MG tablet 30 tablet      [facility] 

## 2020-08-27 MED ORDER — HYDROCODONE-ACETAMINOPHEN 5-325 MG PO TABS: ORAL_TABLET | ORAL | 0 refills | Status: DC

## 2020-09-08 NOTE — Patient Instructions (Signed)
VALICIA RIEF  09/08/2020     @PREFPERIOPPHARMACY @   Your procedure is scheduled on 09/16/2020.  Report to [facility] at  0800  A.M.  Call this number if you have problems the morning of surgery:  [phone]   Remember:  Follow the diet and prep instructions given to you by the office.                      Take these medicines the morning of surgery with A SIP OF WATER  Librium, bentyl(if needed), hydrocodone(if needed), protonix, laminal, dilantin. Use your nebulizer and your inhaler.    Do not wear jewelry, make-up or nail polish.  Do not wear lotions, powders, or perfumes. Please wear deodorant and brush your teeth.  Do not shave 48 hours prior to surgery.  Men may shave face and neck.  Do not bring valuables to the hospital.  [facility] is not responsible for any belongings or valuables.  Contacts, dentures or bridgework may not be worn into surgery.  Leave your suitcase in the car.  After surgery it may be brought to your room.  For patients admitted to the hospital, discharge time will be determined by your treatment team.  Patients discharged the day of surgery will not be allowed to drive home.   Name and phone number of your driver:   family Special instructions:   DO NOT smoke the morning of your procedure.  Please read over the following fact sheets that you were given. Anesthesia Post-op Instructions and Care and Recovery After Surgery       Colonoscopy, Adult, Care After This sheet gives you information about how to care for yourself after your procedure. Your health care provider may also give you more specific instructions. If you have problems or questions, contact your health care provider. What can I expect after the procedure? After the procedure, it is common to have:  A small amount of blood in your stool for 24 hours after the procedure.  Some gas.  Mild cramping or bloating of your abdomen. Follow these instructions at  home: Eating and drinking   Drink enough fluid to keep your urine pale yellow.  Follow instructions from your health care provider about eating or drinking restrictions.  Resume your normal diet as instructed by your health care provider. Avoid heavy or fried foods that are hard to digest. Activity  Rest as told by your health care provider.  Avoid sitting for a long time without moving. Get up to take short walks every 1-2 hours. This is important to improve blood flow and breathing. Ask for help if you feel weak or unsteady.  Return to your normal activities as told by your health care provider. Ask your health care provider what activities are safe for you. Managing cramping and bloating   Try walking around when you have cramps or feel bloated.  Apply heat to your abdomen as told by your health care provider. Use the heat source that your health care provider recommends, such as a moist heat pack or a heating pad. ? Place a towel between your skin and the heat source. ? Leave the heat on for 20-30 minutes. ? Remove the heat if your skin turns bright red. This is especially important if you are unable to feel pain, heat, or cold. You may have a greater risk of getting burned. General instructions  For the first 24 hours after the procedure: ?  Do not drive or use machinery. ? Do not sign important documents. ? Do not drink alcohol. ? Do your regular daily activities at a slower pace than normal. ? Eat soft foods that are easy to digest.  Take over-the-counter and prescription medicines only as told by your health care provider.  Keep all follow-up visits as told by your health care provider. This is important. Contact a health care provider if:  You have blood in your stool 2-3 days after the procedure. Get help right away if you have:  More than a small spotting of blood in your stool.  Large blood clots in your stool.  Swelling of your abdomen.  Nausea or  vomiting.  A fever.  Increasing pain in your abdomen that is not relieved with medicine. Summary  After the procedure, it is common to have a small amount of blood in your stool. You may also have mild cramping and bloating of your abdomen.  For the first 24 hours after the procedure, do not drive or use machinery, sign important documents, or drink alcohol.  Get help right away if you have a lot of blood in your stool, nausea or vomiting, a fever, or increased pain in your abdomen. This information is not intended to replace advice given to you by your health care provider. Make sure you discuss any questions you have with your health care provider. Document Revised: 04/30/2019 Document Reviewed: 04/30/2019 Elsevier Patient Education  [place] After These instructions provide you with information about caring for yourself after your procedure. Your health care provider may also give you more specific instructions. Your treatment has been planned according to current medical practices, but problems sometimes occur. Call your health care provider if you have any problems or questions after your procedure. What can I expect after the procedure? After your procedure, you may:  Feel sleepy for several hours.  Feel clumsy and have poor balance for several hours.  Feel forgetful about what happened after the procedure.  Have poor judgment for several hours.  Feel nauseous or vomit.  Have a sore throat if you had a breathing tube during the procedure. Follow these instructions at home: For at least 24 hours after the procedure:      Have a responsible adult stay with you. It is important to have someone help care for you until you are awake and alert.  Rest as needed.  Do not: ? Participate in activities in which you could fall or become injured. ? Drive. ? Use heavy machinery. ? Drink alcohol. ? Take sleeping pills or medicines that  cause drowsiness. ? Make important decisions or sign legal documents. ? Take care of children on your own. Eating and drinking  Follow the diet that is recommended by your health care provider.  If you vomit, drink water, juice, or soup when you can drink without vomiting.  Make sure you have little or no nausea before eating solid foods. General instructions  Take over-the-counter and prescription medicines only as told by your health care provider.  If you have sleep apnea, surgery and certain medicines can increase your risk for breathing problems. Follow instructions from your health care provider about wearing your sleep device: ? Anytime you are sleeping, including during daytime naps. ? While taking prescription pain medicines, sleeping medicines, or medicines that make you drowsy.  If you smoke, do not smoke without supervision.  Keep all follow-up visits as told by your health care  provider. This is important. Contact a health care provider if:  You keep feeling nauseous or you keep vomiting.  You feel light-headed.  You develop a rash.  You have a fever. Get help right away if:  You have trouble breathing. Summary  For several hours after your procedure, you may feel sleepy and have poor judgment.  Have a responsible adult stay with you for at least 24 hours or until you are awake and alert. This information is not intended to replace advice given to you by your health care provider. Make sure you discuss any questions you have with your health care provider. Document Revised: 01/02/2018 Document Reviewed: 01/25/2016 Elsevier Patient Education  [place].

## 2020-09-09 ENCOUNTER — Encounter (HOSPITAL_COMMUNITY): Payer: Self-pay

## 2020-09-09 ENCOUNTER — Encounter (HOSPITAL_COMMUNITY)
Admission: RE | Admit: 2020-09-09 | Discharge: 2020-09-09 | Disposition: A | Discharge status: Home / Self Care | Payer: Medicare Other | Source: Ambulatory Visit | Attending: Internal Medicine | Admitting: Internal Medicine

## 2020-09-09 ENCOUNTER — Other Ambulatory Visit: Payer: Self-pay

## 2020-09-15 ENCOUNTER — Other Ambulatory Visit: Payer: Self-pay

## 2020-09-15 ENCOUNTER — Other Ambulatory Visit (HOSPITAL_COMMUNITY)
Admission: RE | Admit: 2020-09-15 | Discharge: 2020-09-15 | Disposition: A | Discharge status: Home / Self Care | Payer: Medicare Other | Source: Ambulatory Visit | Attending: Internal Medicine | Admitting: Internal Medicine

## 2020-09-15 DIAGNOSIS — Z20822 Contact with and (suspected) exposure to covid-19: Secondary | ICD-10-CM | POA: Insufficient documentation

## 2020-09-15 DIAGNOSIS — Z01818 Encounter for other preprocedural examination: Secondary | ICD-10-CM | POA: Insufficient documentation

## 2020-09-15 LAB — SARS CORONAVIRUS 2 (TAT 6-24 HRS): SARS Coronavirus 2: NEGATIVE

## 2020-09-16 ENCOUNTER — Ambulatory Visit (HOSPITAL_COMMUNITY): Payer: Medicare Other | Admitting: Anesthesiology

## 2020-09-16 ENCOUNTER — Encounter (HOSPITAL_COMMUNITY)
Admission: RE | Disposition: A | Discharge status: Home / Self Care | Payer: Self-pay | Source: Home / Self Care | Attending: Internal Medicine

## 2020-09-16 ENCOUNTER — Ambulatory Visit (HOSPITAL_COMMUNITY)
Admission: RE | Admit: 2020-09-16 | Discharge: 2020-09-16 | Disposition: A | Discharge status: Home / Self Care | Payer: Medicare Other | Attending: Internal Medicine | Admitting: Internal Medicine

## 2020-09-16 DIAGNOSIS — Z5309 Procedure and treatment not carried out because of other contraindication: Secondary | ICD-10-CM | POA: Insufficient documentation

## 2020-09-16 DIAGNOSIS — K625 Hemorrhage of anus and rectum: Secondary | ICD-10-CM | POA: Diagnosis not present

## 2020-09-16 SURGERY — COLONOSCOPY WITH PROPOFOL
Anesthesia: Monitor Anesthesia Care

## 2020-09-18 ENCOUNTER — Ambulatory Visit (INDEPENDENT_AMBULATORY_CARE_PROVIDER_SITE_OTHER): Payer: Medicare Other | Admitting: Orthopaedic Surgery

## 2020-09-18 ENCOUNTER — Other Ambulatory Visit: Payer: Self-pay

## 2020-09-18 ENCOUNTER — Encounter: Payer: Self-pay | Admitting: Orthopaedic Surgery

## 2020-09-18 DIAGNOSIS — G8929 Other chronic pain: Secondary | ICD-10-CM

## 2020-09-18 DIAGNOSIS — R531 Weakness: Secondary | ICD-10-CM

## 2020-09-18 DIAGNOSIS — M25561 Pain in right knee: Secondary | ICD-10-CM

## 2020-09-18 MED ORDER — HYDROCODONE-ACETAMINOPHEN 5-325 MG PO TABS: ORAL_TABLET | ORAL | 0 refills | Status: DC

## 2020-09-18 NOTE — Progress Notes (Signed)
PROCEDURE NOTE:  The patient requests injections of the right knee , verbal consent was obtained.  The right knee was prepped appropriately after time out was performed.   Sterile technique was observed and injection of 1 cc of Depo-Medrol 40 mg with several cc's of plain xylocaine. Anesthesia was provided by ethyl chloride and a 20-gauge needle was used to inject the knee area. The injection was tolerated well.  A band aid dressing was applied.  The patient was advised to apply ice later today and tomorrow to the injection sight as needed.  I will see as needed.  Electronically Gilbert, MD 12/2/20218:11 AM

## 2020-09-23 ENCOUNTER — Telehealth: Payer: Self-pay | Admitting: Internal Medicine

## 2020-09-23 NOTE — Telephone Encounter (Signed)
Last OV 06/2020. Aware needs OV to r/s. OV made 12/10 at 8:00am.

## 2020-09-23 NOTE — Telephone Encounter (Signed)
Pt's caregiver called asking to schedule patient's colonoscopy. [phone]

## 2020-09-26 ENCOUNTER — Other Ambulatory Visit: Payer: Self-pay

## 2020-09-26 ENCOUNTER — Encounter: Payer: Self-pay | Admitting: Gastroenterology

## 2020-09-26 ENCOUNTER — Ambulatory Visit (INDEPENDENT_AMBULATORY_CARE_PROVIDER_SITE_OTHER): Payer: Medicare Other | Admitting: Gastroenterology

## 2020-09-26 VITALS — BP 138/67 | HR 96 | Temp 96.9°F | Ht 64.0 in | Wt 161.6 lb

## 2020-09-26 DIAGNOSIS — K7460 Unspecified cirrhosis of liver: Secondary | ICD-10-CM

## 2020-09-26 DIAGNOSIS — D649 Anemia, unspecified: Secondary | ICD-10-CM

## 2020-09-26 DIAGNOSIS — K2100 Gastro-esophageal reflux disease with esophagitis, without bleeding: Secondary | ICD-10-CM | POA: Diagnosis not present

## 2020-09-26 DIAGNOSIS — K625 Hemorrhage of anus and rectum: Secondary | ICD-10-CM | POA: Diagnosis not present

## 2020-09-26 MED ORDER — CLENPIQ 10-3.5-12 MG-GM -GM/160ML PO SOLN: 1.0000 | Freq: Once | ORAL | 0 refills | Status: AC

## 2020-09-26 MED ORDER — LINACLOTIDE 290 MCG PO CAPS: 290.0000 ug | ORAL_CAPSULE | Freq: Every day | ORAL | 5 refills | Status: DC

## 2020-09-26 NOTE — Patient Instructions (Addendum)
1. We will call your pharmacy and update your medication list.  2. Increase Linzess to 290 mcg daily for constipation.  New prescription provided to take the place of your old Linzess dose.  Samples also provided. 3. Continue to cut back on alcohol use. 4. Colonoscopy as scheduled. See separate instructions.

## 2020-09-26 NOTE — Progress Notes (Signed)
Reviewed med list from [facility] and updated note and epic med list.

## 2020-09-26 NOTE — Progress Notes (Addendum)
Primary Care Physician: [name], MD  Primary Gastroenterologist:  Elon Alas. Abbey Chatters, DO   Chief Complaint  Patient presents with  . Rectal Bleeding    Denies having any  . Abdominal Pain    Denies   . Constipation    BM's are every 3 times per week    HPI: [name] is a 64 y.o. female here for consideration of rescheduling colonoscopy.  She was last seen in September.  Colonoscopy was scheduled for rectal bleeding.  She has a history of reflux esophagitis, anemia, and alcoholic cirrhosis.  She was scheduled for colonoscopy recently, not completed.  According to the patient, procedure was canceled because she ate something after her bowel prep.  Patient states that her nurse told her wrong.  Patient and her friend who presents today report that they cannot read.  Patient is not sure that she did her prep right last time, she was given the MiraLAX prep.  Denies any further rectal bleeding.  No abdominal pain.  After last visit she was having some diarrhea off and on, advised her to back off on her Linzess.  States that she is having constipation again.  Has a bowel movement about 3 times per week.  States that she is taking her Linzess every day.  No melena.  No heartburn, dysphagia, vomiting.  Last imaging of the liver ultrasound in September with 0.5 cm gallbladder polyp, nodularity of the liver border noted.  Labs as outlined below.  MELD Na of 6 06/2020. EGD March 2019 with grade D esophagitis, medium sized hiatal hernia.  No esophageal varices. Last colonoscopy 09/2012 moderate diverticulosis, tubular adenoma removed. Random colon bx negative.  Patient did not know her medication today.  We have requested list from [facility].   Current Outpatient Medications  Medication Sig Dispense Refill  . ENSURE PLUS (ENSURE PLUS) LIQD Take 237 mLs by mouth 2 (two) times daily between meals.     Marland Kitchen linaclotide (LINZESS) 145 MCG CAPS capsule Take 145 mcg by mouth  daily before breakfast.    . pantoprazole (PROTONIX) 40 MG tablet Take 1 tablet (40 mg total) by mouth 2 (two) times daily before a meal. 60 tablet 0  .       . albuterol (PROVENTIL HFA;VENTOLIN HFA) 108 (90 BASE) MCG/ACT inhaler Inhale 2 puffs into the lungs every 6 (six) hours as needed for wheezing or shortness of breath.     Marland Kitchen albuterol (PROVENTIL) (2.5 MG/3ML) 0.083% nebulizer solution Take 2.5 mg by nebulization every 6 (six) hours as needed for wheezing or shortness of breath.     .       .      .      Marland Kitchen HYDROcodone-acetaminophen (NORCO/VICODIN) 5-325 MG tablet One tablet every four hours for pain. 30 tablet 0  . lisinopril (ZESTRIL) 40 MG tablet Take 1 tablet (40 mg total) by mouth daily. 30 tablet 2  .       . PHENobarbital (LUMINAL) 32.4 MG tablet Take 32.4 mg by mouth 2 (two) times daily.    . phenytoin (DILANTIN) 100 MG ER capsule Take 100 mg by mouth 3 (three) times daily.    . potassium chloride SA (KLOR-CON) 20 MEQ tablet Take 20 mEq by mouth 3 (three) times daily.    .       . solifenacin (VESICARE) 5 MG tablet Take 5 mg by mouth daily.    Marland Kitchen       Marland Kitchen  No current facility-administered medications for this visit.    Allergies as of 09/26/2020  . (No Known Allergies)   Past Medical History:  Diagnosis Date  . Alcohol abuse   . Asthma   . Contracture of muscle of hand    right  . GERD (gastroesophageal reflux disease)   . HTN (hypertension)   . PUD (peptic ulcer disease)    remote  . Seizures ([place])    since stroke, no recent seizures  . Stroke [facility])    age 50, required brain surgery effected right side   Past Surgical History:  Procedure Laterality Date  . ABDOMINAL HYSTERECTOMY  2004   complete  . BRAIN SURGERY     age 26 stroke  . COLONOSCOPY    . COLONOSCOPY WITH PROPOFOL  10/03/2012   Dr. [name]: moderate diverticulosis, small internal hemorrhoids, TUBULAR ADENOMA. Next colonoscopy 2023 per Dr. [name].  . ESOPHAGOGASTRODUODENOSCOPY N/A 10/18/2014    Dr. Gala Romney during inpatient hospitalization: severe exudative/erosive reflux esophagitis as source of trivial upper GI bleed. No varices   . ESOPHAGOGASTRODUODENOSCOPY (EGD) WITH PROPOFOL  10/03/2012   Dr. [name]: stricture at [place] s/p dilation, mild gastritis negative H.pylori   . ESOPHAGOGASTRODUODENOSCOPY (EGD) WITH PROPOFOL N/A 12/27/2017   Grade D esophagitis. Medium-sized hiatal hernia, normal duodenum  . right tib-fib fracture  2008  . SAVORY DILATION  10/03/2012   Procedure: SAVORY DILATION;  Surgeon: Danie Binder, MD;  Location: [facility];  Service: Endoscopy;  Laterality: N/A;  started at 1303,  dilated 12.8-16mm   Family History  Problem Relation Age of Onset  . Liver disease Sister        etoh  . Colon cancer Neg Hx    Social History   Tobacco Use  . Smoking status: Former Smoker    Packs/day: 0.04    Years: 9.00    Pack years: 0.36    Types: Cigarettes    Quit date: 07/28/2014    Years since quitting: 6.1  . Smokeless tobacco: Never Used  Vaping Use  . Vaping Use: Never used  Substance Use Topics  . Alcohol use: Yes    Alcohol/week: 35.0 standard drinks    Types: 35 Cans of beer per week    Comment: Patient reports she drinks "a lot" of beer each day (07/11/2020)  . Drug use: No    ROS:  General: Negative for anorexia, weight loss, fever, chills, fatigue, chronic right upper extremity weakness. ENT: Negative for hoarseness, difficulty swallowing , nasal congestion. CV: Negative for chest pain, angina, palpitations, dyspnea on exertion, peripheral edema.  Respiratory: Negative for dyspnea at rest, dyspnea on exertion, cough, sputum, wheezing.  GI: See history of present illness. GU:  Negative for dysuria, hematuria, urinary incontinence, urinary frequency, nocturnal urination.  Endo: Negative for unusual weight change.    Physical Examination:   BP 138/67   Pulse 96   Temp (!) 96.9 F (36.1 C)   Ht 5\' 4"  (1.626 m)   Wt 161 lb 9.6 oz (73.3 kg)   BMI  27.74 kg/m   General: Well-nourished, well-developed in no acute distress.  Accompanied by friend. Eyes: No icterus. Mouth: masked Lungs: Clear to auscultation bilaterally.  Heart: Regular rate and rhythm, no murmurs rubs or gallops.  Abdomen: Bowel sounds are normal, nontender, nondistended, no hepatosplenomegaly or masses, no abdominal bruits or hernia , no rebound or guarding.   Extremities: No lower extremity edema. No clubbing or deformities. Neuro: Alert and oriented x 4   Skin:  Warm and dry, no jaundice.   Psych: Alert and cooperative, normal mood and affect.  Labs:  Lab Results  Component Value Date   CREATININE 0.70 08/15/2020   BUN 9 08/15/2020   NA 134 (L) 08/15/2020   K 4.5 08/15/2020   CL 99 08/15/2020   CO2 25 08/15/2020   Lab Results  Component Value Date   ALT 21 08/15/2020   AST 36 (H) 08/15/2020   ALKPHOS 93 04/02/2020   BILITOT 0.6 08/15/2020     Lab Results  Component Value Date   INR 1.0 07/16/2020   INR 1.1 11/01/2019   INR 1.1 10/08/2019   Lab Results  Component Value Date   WBC 3.7 (L) 07/16/2020   HGB 10.2 (L) 07/16/2020   HCT 30.3 (L) 07/16/2020   MCV 83.2 07/16/2020   PLT 152 07/16/2020    Imaging Studies: No results found.   Impression/plan:  64 year old female with history of alcohol abuse, GERD, chronic alternating constipation and diarrhea, chronic normocytic anemia presenting to reschedule colonoscopy.  Procedure was scheduled previously for rectal bleeding which is now resolved.  Rectal bleeding: Last colonoscopy in 2013.  Appears to be self-limiting bleeding, has resolved.  Suspect benign anorectal bleeding.  Patient did not complete colonoscopy as recently scheduled, according to her she ate after taking the prep because her nurse told her she was able to.  Procedure was canceled.  She would like to reschedule.  Both her and her friend do not read well.  Nurse aide did not present to the office today.  We will try to streamline  her bowel prep utilizing an easier prep than she had last time.  Verbally provided instructions today but encouraged her to have someone who is able to read instructions assist her starting 2 days before her procedure.  She agreed.  Colonoscopy in the near future with Dr. Abbey Chatters. ASA III.  I have discussed the risks, alternatives, benefits with regards to but not limited to the risk of reaction to medication, bleeding, infection, perforation and the patient is agreeable to proceed. Written consent to be obtained.  Chronic alternating constipation and diarrhea.  Having predominantly constipation at this time.  Taking Linzess 145 mcg daily.  We will increase her to 290 mcg daily for now.  Hold if she develops diarrhea.  Patient states that she's discontinued dicyclomine.  We have called [facility] to confirm her medication list.  Alcoholic cirrhosis: Currently up-to-date on labs and ultrasound.  Continue to encourage alcohol cessation.  Return to the office in 4 months.  Anemia: Likely multifactorial in the setting of poor nutrition, bone marrow suppression with alcohol abuse/cirrhosis.  H&H has been stable.  Colonoscopy is planned.  Will verify that she is still on PPI.

## 2020-09-26 NOTE — Progress Notes (Signed)
CC'ED TO PCP 

## 2020-09-26 NOTE — Addendum Note (Signed)
Addended by: [name] on: 09/26/2020 02:11 PM   Modules accepted: Orders

## 2020-11-04 ENCOUNTER — Ambulatory Visit: Payer: Medicare Other | Admitting: Orthopaedic Surgery

## 2020-11-06 ENCOUNTER — Encounter: Payer: Self-pay | Admitting: Orthopaedic Surgery

## 2020-11-06 ENCOUNTER — Ambulatory Visit (INDEPENDENT_AMBULATORY_CARE_PROVIDER_SITE_OTHER): Payer: Medicare Other | Admitting: Orthopaedic Surgery

## 2020-11-06 ENCOUNTER — Other Ambulatory Visit: Payer: Self-pay

## 2020-11-06 VITALS — BP 140/64 | Ht 64.0 in | Wt 162.0 lb

## 2020-11-06 DIAGNOSIS — R531 Weakness: Secondary | ICD-10-CM

## 2020-11-06 DIAGNOSIS — G8929 Other chronic pain: Secondary | ICD-10-CM | POA: Diagnosis not present

## 2020-11-06 DIAGNOSIS — M25561 Pain in right knee: Secondary | ICD-10-CM

## 2020-11-06 MED ORDER — HYDROCODONE-ACETAMINOPHEN 5-325 MG PO TABS: ORAL_TABLET | ORAL | 0 refills | Status: DC

## 2020-11-06 NOTE — Progress Notes (Signed)
PROCEDURE NOTE:  The patient requests injections of the right knee , verbal consent was obtained.  The right knee was prepped appropriately after time out was performed.   Sterile technique was observed and injection of 1 cc of Depo-Medrol 40 mg with several cc's of plain xylocaine. Anesthesia was provided by ethyl chloride and a 20-gauge needle was used to inject the knee area. The injection was tolerated well.  A band aid dressing was applied.  The patient was advised to apply ice later today and tomorrow to the injection sight as needed.  I have reviewed the [place] web site prior to prescribing narcotic medicine for this patient.   Return in six weeks.  Electronically Signed Sanjuana Kava, MD 1/20/20229:32 AM

## 2020-11-13 NOTE — Patient Instructions (Signed)
[name]  11/13/2020     @PREFPERIOPPHARMACY @   Your procedure is scheduled on  11/18/2020   Report to [facility] at  [place].    Call this number if you have problems the morning of surgery:  [phone]   Remember: Follow the diet and prep instructions given to you by the office.                      Take these medicines the morning of surgery with A SIP OF WATER               Hydrocodone (if needed), phenobarbitol, dilantin, vesicare.   Please brush your teeth.  Do not wear jewelry, make-up or nail polish.  Do not wear lotions, powders, or perfumes, or deodorant.  Do not shave 48 hours prior to surgery.  Men may shave face and neck.  Do not bring valuables to the hospital.  [facility] is not responsible for any belongings or valuables.  Contacts, dentures or bridgework may not be worn into surgery.  Leave your suitcase in the car.  After surgery it may be brought to your room.  For patients admitted to the hospital, discharge time will be determined by your treatment team.  Patients discharged the day of surgery will not be allowed to drive home and the must have someone with you for 24 hours.   Special instructions:  DO NOT smoke (tobacco or vape) the morning of your procedure.  Please read over the following fact sheets that you were given. Anesthesia Post-op Instructions and Care and Recovery After Surgery       Colonoscopy, Adult, Care After This sheet gives you information about how to care for yourself after your procedure. Your health care provider may also give you more specific instructions. If you have problems or questions, contact your health care provider. What can I expect after the procedure? After the procedure, it is common to have:  A small amount of blood in your stool for 24 hours after the procedure.  Some gas.  Mild cramping or bloating of your abdomen. Follow these instructions at home: Eating and drinking  Drink  enough fluid to keep your urine pale yellow.  Follow instructions from your health care provider about eating or drinking restrictions.  Resume your normal diet as instructed by your health care provider. Avoid heavy or fried foods that are hard to digest.   Activity  Rest as told by your health care provider.  Avoid sitting for a long time without moving. Get up to take short walks every 1-2 hours. This is important to improve blood flow and breathing. Ask for help if you feel weak or unsteady.  Return to your normal activities as told by your health care provider. Ask your health care provider what activities are safe for you. Managing cramping and bloating  Try walking around when you have cramps or feel bloated.  Apply heat to your abdomen as told by your health care provider. Use the heat source that your health care provider recommends, such as a moist heat pack or a heating pad. ? Place a towel between your skin and the heat source. ? Leave the heat on for 20-30 minutes. ? Remove the heat if your skin turns bright red. This is especially important if you are unable to feel pain, heat, or cold. You may have a greater risk of getting burned.  General instructions  If you were given a sedative during the procedure, it can affect you for several hours. Do not drive or operate machinery until your health care provider says that it is safe.  For the first 24 hours after the procedure: ? Do not sign important documents. ? Do not drink alcohol. ? Do your regular daily activities at a slower pace than normal. ? Eat soft foods that are easy to digest.  Take over-the-counter and prescription medicines only as told by your health care provider.  Keep all follow-up visits as told by your health care provider. This is important. Contact a health care provider if:  You have blood in your stool 2-3 days after the procedure. Get help right away if you have:  More than a small spotting of  blood in your stool.  Large blood clots in your stool.  Swelling of your abdomen.  Nausea or vomiting.  A fever.  Increasing pain in your abdomen that is not relieved with medicine. Summary  After the procedure, it is common to have a small amount of blood in your stool. You may also have mild cramping and bloating of your abdomen.  If you were given a sedative during the procedure, it can affect you for several hours. Do not drive or operate machinery until your health care provider says that it is safe.  Get help right away if you have a lot of blood in your stool, nausea or vomiting, a fever, or increased pain in your abdomen. This information is not intended to replace advice given to you by your health care provider. Make sure you discuss any questions you have with your health care provider. Document Revised: 09/28/2019 Document Reviewed: 04/30/2019 Elsevier Patient Education  2021 [place] After This sheet gives you information about how to care for yourself after your procedure. Your health care provider may also give you more specific instructions. If you have problems or questions, contact your health care provider. What can I expect after the procedure? After the procedure, it is common to have:  Tiredness.  Forgetfulness about what happened after the procedure.  Impaired judgment for important decisions.  Nausea or vomiting.  Some difficulty with balance. Follow these instructions at home: For the time period you were told by your health care provider:  Rest as needed.  Do not participate in activities where you could fall or become injured.  Do not drive or use machinery.  Do not drink alcohol.  Do not take sleeping pills or medicines that cause drowsiness.  Do not make important decisions or sign legal documents.  Do not take care of children on your own.      Eating and drinking  Follow the diet that is  recommended by your health care provider.  Drink enough fluid to keep your urine pale yellow.  If you vomit: ? Drink water, juice, or soup when you can drink without vomiting. ? Make sure you have little or no nausea before eating solid foods. General instructions  Have a responsible adult stay with you for the time you are told. It is important to have someone help care for you until you are awake and alert.  Take over-the-counter and prescription medicines only as told by your health care provider.  If you have sleep apnea, surgery and certain medicines can increase your risk for breathing problems. Follow instructions from your health care provider about wearing your sleep device: ? Anytime you are  sleeping, including during daytime naps. ? While taking prescription pain medicines, sleeping medicines, or medicines that make you drowsy.  Avoid smoking.  Keep all follow-up visits as told by your health care provider. This is important. Contact a health care provider if:  You keep feeling nauseous or you keep vomiting.  You feel light-headed.  You are still sleepy or having trouble with balance after 24 hours.  You develop a rash.  You have a fever.  You have redness or swelling around the IV site. Get help right away if:  You have trouble breathing.  You have new-onset confusion at home. Summary  For several hours after your procedure, you may feel tired. You may also be forgetful and have poor judgment.  Have a responsible adult stay with you for the time you are told. It is important to have someone help care for you until you are awake and alert.  Rest as told. Do not drive or operate machinery. Do not drink alcohol or take sleeping pills.  Get help right away if you have trouble breathing, or if you suddenly become confused. This information is not intended to replace advice given to you by your health care provider. Make sure you discuss any questions you have  with your health care provider. Document Revised: 06/19/2020 Document Reviewed: 09/06/2019 Elsevier Patient Education  2021 [employer].

## 2020-11-14 ENCOUNTER — Encounter (HOSPITAL_COMMUNITY)
Admission: RE | Admit: 2020-11-14 | Discharge: 2020-11-14 | Disposition: A | Discharge status: Home / Self Care | Payer: Medicare Other | Source: Ambulatory Visit | Attending: Internal Medicine | Admitting: Internal Medicine

## 2020-11-14 ENCOUNTER — Other Ambulatory Visit (HOSPITAL_COMMUNITY): Payer: Medicare Other | Attending: Internal Medicine

## 2020-11-14 ENCOUNTER — Encounter (HOSPITAL_COMMUNITY): Payer: Self-pay

## 2020-11-14 NOTE — Pre-Procedure Instructions (Signed)
Messaged Mindy at [facility] to let her know patient did not show for her PAT and COVID today.

## 2020-11-17 ENCOUNTER — Telehealth: Payer: Self-pay | Admitting: *Deleted

## 2020-11-17 NOTE — Telephone Encounter (Signed)
-----   Message from Encarnacion Chu, RN sent at 11/14/2020  1:33 PM EST ----- Regarding: no show Hey [name]. [name] did not show for her PAT and COVID today.

## 2020-11-17 NOTE — Telephone Encounter (Signed)
Called pt, went straight VM Called endo, procedure placed in depot

## 2020-11-18 ENCOUNTER — Encounter (HOSPITAL_COMMUNITY): Admission: RE | Payer: Self-pay | Source: Home / Self Care

## 2020-11-18 ENCOUNTER — Ambulatory Visit (HOSPITAL_COMMUNITY): Admission: RE | Admit: 2020-11-18 | Payer: Medicare Other | Source: Home / Self Care

## 2020-11-18 SURGERY — COLONOSCOPY WITH PROPOFOL
Anesthesia: Monitor Anesthesia Care

## 2020-11-18 NOTE — Telephone Encounter (Signed)
Letter mailed to patient. FYI to leslie

## 2020-11-21 ENCOUNTER — Ambulatory Visit: Payer: Medicare Other | Admitting: Gastroenterology

## 2020-12-16 ENCOUNTER — Telehealth: Payer: Self-pay | Admitting: Internal Medicine

## 2020-12-16 NOTE — Telephone Encounter (Signed)
Recall for ultrasound 

## 2020-12-17 ENCOUNTER — Emergency Department (HOSPITAL_COMMUNITY): Payer: Medicare Other

## 2020-12-17 ENCOUNTER — Inpatient Hospital Stay (HOSPITAL_COMMUNITY)
Admission: EM | Admit: 2020-12-17 | Discharge: 2020-12-19 | DRG: 100 | Disposition: A | Discharge status: Home / Self Care | Payer: Medicare Other | Attending: Family Medicine | Admitting: Family Medicine

## 2020-12-17 ENCOUNTER — Other Ambulatory Visit: Payer: Self-pay

## 2020-12-17 DIAGNOSIS — E871 Hypo-osmolality and hyponatremia: Secondary | ICD-10-CM | POA: Diagnosis present

## 2020-12-17 DIAGNOSIS — G9340 Encephalopathy, unspecified: Secondary | ICD-10-CM | POA: Diagnosis present

## 2020-12-17 DIAGNOSIS — Z8711 Personal history of peptic ulcer disease: Secondary | ICD-10-CM

## 2020-12-17 DIAGNOSIS — K5730 Diverticulosis of large intestine without perforation or abscess without bleeding: Secondary | ICD-10-CM | POA: Diagnosis present

## 2020-12-17 DIAGNOSIS — R4182 Altered mental status, unspecified: Secondary | ICD-10-CM

## 2020-12-17 DIAGNOSIS — I10 Essential (primary) hypertension: Secondary | ICD-10-CM | POA: Diagnosis present

## 2020-12-17 DIAGNOSIS — E872 Acidosis, unspecified: Secondary | ICD-10-CM

## 2020-12-17 DIAGNOSIS — F1010 Alcohol abuse, uncomplicated: Secondary | ICD-10-CM | POA: Diagnosis present

## 2020-12-17 DIAGNOSIS — Y908 Blood alcohol level of 240 mg/100 ml or more: Secondary | ICD-10-CM | POA: Diagnosis present

## 2020-12-17 DIAGNOSIS — G9341 Metabolic encephalopathy: Secondary | ICD-10-CM | POA: Diagnosis present

## 2020-12-17 DIAGNOSIS — R569 Unspecified convulsions: Secondary | ICD-10-CM

## 2020-12-17 DIAGNOSIS — Z8673 Personal history of transient ischemic attack (TIA), and cerebral infarction without residual deficits: Secondary | ICD-10-CM

## 2020-12-17 DIAGNOSIS — Z79899 Other long term (current) drug therapy: Secondary | ICD-10-CM

## 2020-12-17 DIAGNOSIS — R140 Abdominal distension (gaseous): Secondary | ICD-10-CM

## 2020-12-17 DIAGNOSIS — F10129 Alcohol abuse with intoxication, unspecified: Secondary | ICD-10-CM | POA: Diagnosis present

## 2020-12-17 DIAGNOSIS — G40909 Epilepsy, unspecified, not intractable, without status epilepticus: Principal | ICD-10-CM

## 2020-12-17 DIAGNOSIS — D696 Thrombocytopenia, unspecified: Secondary | ICD-10-CM | POA: Diagnosis present

## 2020-12-17 DIAGNOSIS — Z87891 Personal history of nicotine dependence: Secondary | ICD-10-CM

## 2020-12-17 DIAGNOSIS — K219 Gastro-esophageal reflux disease without esophagitis: Secondary | ICD-10-CM | POA: Diagnosis present

## 2020-12-17 DIAGNOSIS — Z20822 Contact with and (suspected) exposure to covid-19: Secondary | ICD-10-CM | POA: Diagnosis present

## 2020-12-17 LAB — COMPREHENSIVE METABOLIC PANEL
ALT: 30 U/L (ref 0–44)
AST: 49 U/L — ABNORMAL HIGH (ref 15–41)
Albumin: 3.7 g/dL (ref 3.5–5.0)
Alkaline Phosphatase: 123 U/L (ref 38–126)
Anion gap: 11 (ref 5–15)
BUN: 8 mg/dL (ref 8–23)
CO2: 19 mmol/L — ABNORMAL LOW (ref 22–32)
Calcium: 8.7 mg/dL — ABNORMAL LOW (ref 8.9–10.3)
Chloride: 97 mmol/L — ABNORMAL LOW (ref 98–111)
Creatinine, Ser: 0.65 mg/dL (ref 0.44–1.00)
GFR, Estimated: >60 mL/min (ref >60)
Glucose, Bld: 105 mg/dL — ABNORMAL HIGH (ref 70–99)
Potassium: 4.4 mmol/L (ref 3.5–5.1)
Sodium: 127 mmol/L — ABNORMAL LOW (ref 135–145)
Total Bilirubin: 0.4 mg/dL (ref 0.3–1.2)
Total Protein: 7.8 g/dL (ref 6.5–8.1)

## 2020-12-17 LAB — CBC WITH DIFFERENTIAL/PLATELET
Abs Immature Granulocytes: 0.01 10*3/uL (ref 0.00–0.07)
Basophils Absolute: 0 10*3/uL (ref 0.0–0.1)
Basophils Relative: 1 %
Eosinophils Absolute: 0 10*3/uL (ref 0.0–0.5)
Eosinophils Relative: 1 %
HCT: 31.7 % — ABNORMAL LOW (ref 36.0–46.0)
Hemoglobin: 10.4 g/dL — ABNORMAL LOW (ref 12.0–15.0)
Immature Granulocytes: 0 %
Lymphocytes Relative: 43 %
Lymphs Abs: 1.4 10*3/uL (ref 0.7–4.0)
MCH: 27.8 pg (ref 26.0–34.0)
MCHC: 32.8 g/dL (ref 30.0–36.0)
MCV: 84.8 fL (ref 80.0–100.0)
Monocytes Absolute: 0.4 10*3/uL (ref 0.1–1.0)
Monocytes Relative: 11 %
Neutro Abs: 1.5 10*3/uL — ABNORMAL LOW (ref 1.7–7.7)
Neutrophils Relative %: 44 %
Platelets: 145 10*3/uL — ABNORMAL LOW (ref 150–400)
RBC: 3.74 MIL/uL — ABNORMAL LOW (ref 3.87–5.11)
RDW: 17 % — ABNORMAL HIGH (ref 11.5–15.5)
WBC: 3.3 10*3/uL — ABNORMAL LOW (ref 4.0–10.5)
nRBC: 0 % (ref 0.0–0.2)

## 2020-12-17 LAB — PHENYTOIN LEVEL, TOTAL: Phenytoin Lvl: 5 ug/mL — ABNORMAL LOW (ref 10.0–20.0)

## 2020-12-17 LAB — BLOOD GAS, VENOUS
Acid-base deficit: 2.6 mmol/L — ABNORMAL HIGH (ref 0.0–2.0)
Bicarbonate: 21.1 mmol/L (ref 20.0–28.0)
Drawn by: 6000
FIO2: 21
O2 Saturation: 35.6 %
Patient temperature: 36
pCO2, Ven: 39.5 mmHg — ABNORMAL LOW (ref 44.0–60.0)
pH, Ven: 7.362 (ref 7.250–7.430)
pO2, Ven: <31 mmHg — CL (ref 32.0–45.0)

## 2020-12-17 LAB — ETHANOL: Alcohol, Ethyl (B): 260 mg/dL — ABNORMAL HIGH (ref <10)

## 2020-12-17 LAB — AMMONIA: Ammonia: 62 umol/L — ABNORMAL HIGH (ref 9–35)

## 2020-12-17 MED ORDER — IOHEXOL 300 MG/ML  SOLN
100.0000 mL | Freq: Once | INTRAMUSCULAR | Status: AC | PRN
  Administered 2020-12-17: 100 mL via INTRAVENOUS

## 2020-12-17 MED ORDER — SODIUM CHLORIDE 0.9 % IV BOLUS
500.0000 mL | Freq: Once | INTRAVENOUS | Status: AC
  Administered 2020-12-17: 500 mL via INTRAVENOUS

## 2020-12-17 NOTE — ED Provider Notes (Signed)
Care assumed from Dr. Eulis Foster.  Patient with a history of previous stroke, seizure disorder, alcohol abuse presented with abdominal pain and distention.  Labs remarkable for hyponatremia 127, alcohol intoxication up 260. Dilantin level 5. Ammonia 62.   Patient's aide was apparently concerned with increasing forgetfulness fullness, abdominal swelling, questionable seizure activity.  CT scan shows no acute findings.  Does show severe changes of cirrhosis CT head is stable.  On reassessment, patient was slow to respond, apparently intoxicated.  She has right-sided contractures and weakness, unclear if this is worse than her baseline.  Labs remarkable for alcohol intoxication mild hyponatremia.  Doubt hyponatremia as source of her apparent seizure.  Will load with Dilantin.  Plan observation admission given that she is not yet back at her baseline D/w Dr. Clearence Ped.    Ezequiel Essex, MD 12/18/20 [phone]

## 2020-12-17 NOTE — ED Notes (Signed)
Date and time results received: 12/17/20 2309  Test: pO2 Critical Value: <31.0  Name of Provider Notified: Eulis Foster, MD  Orders Received? Or Actions Taken?: acknowledged

## 2020-12-17 NOTE — ED Notes (Addendum)
Hope, aide, called to check on patient status. She would like to be contacted as soon as we get an update. # is in chart.

## 2020-12-17 NOTE — Telephone Encounter (Signed)
Letter mailed

## 2020-12-17 NOTE — ED Notes (Signed)
Pt cleaned. New linens provided.

## 2020-12-17 NOTE — ED Provider Notes (Signed)
[facility] Provider Note   CSN: [id number] Arrival date & time: 12/17/20  2021     History Chief Complaint  Patient presents with  . Abdominal Pain    [name] is a 65 y.o. female.  HPI Patient presents for evaluation of abdominal distention and seizure.  Patient is unable to give history.  Level 5 caveat-altered mental status    Past Medical History:  Diagnosis Date  . Alcohol abuse   . Asthma   . Contracture of muscle of hand    right  . GERD (gastroesophageal reflux disease)   . HTN (hypertension)   . PUD (peptic ulcer disease)    remote  . Seizures ([place])    since stroke, no recent seizures  . Stroke [facility])    age 53, required brain surgery effected right side    Patient Active Problem List   Diagnosis Date Noted  . Acute metabolic encephalopathy 38/25/0539  . Acute blood loss anemia 11/02/2019  . Overweight (BMI 25.0-29.9) 11/02/2019  . Gait disturbance   . Rectal bleeding 10/08/2019  . Hyponatremia   . Diarrhea 04/11/2019  . Nausea, vomiting, and diarrhea   . ARF (acute renal failure) ([place]) 04/10/2019  . Acute kidney injury ([place]) 01/22/2019  . History of stroke 01/22/2019  . Asthma 01/22/2019  . Gastroenteritis 01/22/2019  . Stroke ([facility]) 06/14/2018  . Cirrhosis of liver without ascites ([place]) 04/21/2017  . AKI (acute kidney injury) ([place]) 07/30/2016  . Anemia 11/25/2015  . Abnormal liver function   . Coffee ground emesis   . Reflux esophagitis   . Sinus tachycardia ([place]) 10/10/2014  . Hypokalemia 10/10/2014  . Seizure disorder ([place]) 10/10/2014  . Alcohol abuse 09/05/2012  . Odynophagia 09/05/2012  . GERD (gastroesophageal reflux disease) 09/05/2012  . Abdominal pain 09/05/2012  . FX CLOSED TIBIA NOS 07/27/2007  . STROKE-With also h/o Aneurysm clipping? 07/25/2007  . Seizures ([place]) 07/25/2007    Past Surgical History:  Procedure Laterality Date  . ABDOMINAL HYSTERECTOMY  2004   complete  . BRAIN SURGERY     age  89 stroke  . COLONOSCOPY    . COLONOSCOPY WITH PROPOFOL  10/03/2012   Dr. [name]: moderate diverticulosis, small internal hemorrhoids, TUBULAR ADENOMA. Next colonoscopy 2023 per Dr. [name].  . ESOPHAGOGASTRODUODENOSCOPY N/A 10/18/2014   Dr. Gala Romney during inpatient hospitalization: severe exudative/erosive reflux esophagitis as source of trivial upper GI bleed. No varices   . ESOPHAGOGASTRODUODENOSCOPY (EGD) WITH PROPOFOL  10/03/2012   Dr. [name]: stricture at [place] junction s/p dilation, mild gastritis negative H.pylori   . ESOPHAGOGASTRODUODENOSCOPY (EGD) WITH PROPOFOL N/A 12/27/2017   Grade D esophagitis. Medium-sized hiatal hernia, normal duodenum  . right tib-fib fracture  2008  . SAVORY DILATION  10/03/2012   Procedure: SAVORY DILATION;  Surgeon: Danie Binder, MD;  Location: [facility];  Service: Endoscopy;  Laterality: N/A;  started at 1303,  dilated 12.8-16mm     OB History    Gravida  4   Para  3   Term  3   Preterm      AB  1   Living  3     SAB  1   IAB      Ectopic      Multiple      Live Births              Family History  Problem Relation Age of Onset  . Liver disease Sister        etoh  .  Colon cancer Neg Hx     Social History   Tobacco Use  . Smoking status: Former Smoker    Packs/day: 0.04    Years: 9.00    Pack years: 0.36    Types: Cigarettes    Quit date: 07/28/2014    Years since quitting: 6.3  . Smokeless tobacco: Never Used  Vaping Use  . Vaping Use: Never used  Substance Use Topics  . Alcohol use: Yes    Alcohol/week: 35.0 standard drinks    Types: 35 Cans of beer per week    Comment: Patient reports she drinks "a lot" of beer each day (07/11/2020)  . Drug use: No    Home Medications Prior to Admission medications   Medication Sig Start Date End Date Taking? Authorizing Provider  acetaminophen (TYLENOL) 325 MG tablet Take 325-650 mg by mouth every 6 (six) hours as needed for moderate pain.     [provider]  albuterol  (PROVENTIL HFA;VENTOLIN HFA) 108 (90 BASE) MCG/ACT inhaler Inhale 2 puffs into the lungs every 6 (six) hours as needed for wheezing or shortness of breath.     [provider]  albuterol (PROVENTIL) (2.5 MG/3ML) 0.083% nebulizer solution Take 2.5 mg by nebulization every 6 (six) hours as needed for wheezing or shortness of breath.     [provider]  ENSURE PLUS (ENSURE PLUS) LIQD Take 237 mLs by mouth 2 (two) times daily between meals.     [provider]  folic acid (FOLVITE) 017 MCG tablet Take 400 mcg by mouth daily.    [provider]  Homeopathic Products (LEG CRAMP RELIEF PO) Take 1 tablet by mouth 3 (three) times daily as needed (leg cramps).    [provider]  HYDROcodone-acetaminophen (NORCO/VICODIN) 5-325 MG tablet One tablet every six hours for pain.  Limit 7 days. 11/06/20   Sanjuana Kava, MD  linaclotide [facility]) 290 MCG CAPS capsule Take 1 capsule (290 mcg total) by mouth daily before breakfast. 09/26/20   [name], PA-C  lisinopril (ZESTRIL) 40 MG tablet Take 1 tablet (40 mg total) by mouth daily. 10/09/19   Roxan Hockey, MD  Multiple Vitamin (MULTIVITAMIN WITH MINERALS) TABS tablet Take 1 tablet by mouth daily. 04/14/19   Johnson, Clanford L, MD  pantoprazole (PROTONIX) 40 MG tablet Take 1 tablet (40 mg total) by mouth 2 (two) times daily before a meal. 11/05/19   Gosrani, Nimish C, MD  PHENobarbital (LUMINAL) 32.4 MG tablet Take 32.4 mg by mouth 2 (two) times daily. 07/13/19   [provider]  phenytoin (DILANTIN) 100 MG ER capsule Take 100 mg by mouth 3 (three) times daily.    [provider]  potassium chloride SA (KLOR-CON) 20 MEQ tablet Take 20 mEq by mouth 3 (three) times daily. 08/25/20   [provider]  solifenacin (VESICARE) 5 MG tablet Take 5 mg by mouth daily. 03/11/20   [provider]    Allergies    Patient has no known allergies.  Review of Systems   Review of Systems   Unable to perform ROS: Mental status change    Physical Exam Updated Vital Signs BP (!) 107/59   Pulse 81   Temp (!) 96.8 F (36 C) (Axillary)   Resp 18   Ht 5\' 4"  (1.626 m)   Wt 72.6 kg   SpO2 99%   BMI 27.46 kg/m   Physical Exam Vitals and nursing note reviewed.  Constitutional:      General: She is not  in acute distress.    Appearance: She is well-developed and well-nourished. She is not ill-appearing, toxic-appearing or diaphoretic.     Comments: She appears older than stated age.  When asked patient what was wrong she stated "I am fine."  HENT:     Head: Normocephalic and atraumatic.     Right Ear: External ear normal.     Left Ear: External ear normal.  Eyes:     Extraocular Movements: EOM normal.     Conjunctiva/sclera: Conjunctivae normal.     Pupils: Pupils are equal, round, and reactive to light.  Neck:     Trachea: Phonation normal.  Cardiovascular:     Rate and Rhythm: Normal rate and regular rhythm.     Heart sounds: Normal heart sounds.  Pulmonary:     Effort: Pulmonary effort is normal.     Breath sounds: Normal breath sounds.  Chest:     Chest wall: No bony tenderness.  Abdominal:     General: There is no distension.     Palpations: Abdomen is soft. There is no mass.     Tenderness: There is abdominal tenderness (Diffuse, moderate). There is no rebound.     Hernia: No hernia is present.  Musculoskeletal:     Cervical back: Normal range of motion and neck supple.     Comments: Right arm contractures, elbow and wrist.  Unable to passively or actively extend right elbow.  Weak extension of lower leg, but able to get off the stretcher.  Normal movement left arm left leg.  Skin:    General: Skin is warm, dry and intact.  Neurological:     Mental Status: She is alert.     Cranial Nerves: No cranial nerve deficit.     Sensory: No sensory deficit.     Motor: No abnormal muscle tone.     Coordination: Coordination normal.  Psychiatric:        Mood and  Affect: Mood and affect and mood normal.        Behavior: Behavior normal.     ED Results / Procedures / Treatments   Labs (all labs ordered are listed, but only abnormal results are displayed) Labs Reviewed - No data to display  EKG None  Radiology No results found.  Procedures Procedures   Medications Ordered in ED Medications - No data to display  ED Course  I have reviewed the triage vital signs and the nursing notes.  Pertinent labs & imaging results that were available during my care of the patient were reviewed by me and considered in my medical decision making (see chart for details).  Clinical Course as of 12/17/20 2246  Wed Dec 17, 2020  2244 I discussed the situation with the patient's aide, whose name is [facility].  Her phone number is listed in the contacts.  Hope states that the patient has ongoing problems with abdominal swelling, forgetfulness, and sometimes sees things that are not there.  Hope is concerned that the patient drinks too much alcohol.  Apparently, today, the patient had a seizure.  Duration of seizure is not known.  Patient lives with her significant other, and has an aide 5 days a week.  She is debilitated, from a left brain stroke causing right-sided weakness.  The patient sometimes takes medicines other times does not take it. [EW]    Clinical Course User Index [EW] Daleen Bo, MD   MDM Rules/Calculators/A&P  Patient Vitals for the past 24 hrs:  BP Temp Temp src Pulse Resp SpO2 Height Weight  12/17/20 2151 (!) 107/59 (!) 96.8 F (36 C) Axillary 81 18 99 % -- --  12/17/20 2049 118/61 (!) 97.4 F (36.3 C) Oral 84 18 100 % -- --  12/17/20 2040 -- -- -- -- -- -- 5\' 4"  (1.626 m) 72.6 kg  12/17/20 2027 -- -- -- -- -- 98 % -- --     Medical Decision Making:  This patient is presenting for evaluation of patient presenting for evaluation of seizure.  She takes Dilantin.  She also has abdominal swelling and reportedly  drinks a lot of alcohol, which does require a range of treatment options, and is a complaint that involves a moderate risk of morbidity and mortality. The differential diagnoses include alcoholic cirrhosis, seizure disorder, medication noncompliance, progressive illness. I decided to review old records, and in summary elderly female presenting with multiple symptoms including abdominal distention and seizure.  There is a reported medication noncompliance I obtained additional historical information from patient's aide who works with her 5 days a week.  Clinical Laboratory Tests Ordered, included CBC, Metabolic panel, Urinalysis and Alcohol level, ammonia level, phenytoin level, Covid test, flu.  Radiologic Tests Ordered, included chest x-ray, CT abdomen pelvis.    Critical Interventions-clinical evaluation, laboratory testing, imaging ordered  After These Interventions, the Patient was reevaluated and was found to require comprehensive work-up for multiple medical problems.  CRITICAL CARE-no Performed by: Daleen Bo  Nursing Notes Reviewed/ Care Coordinated Applicable Imaging Reviewed Interpretation of Laboratory Data incorporated into ED treatment  Plan-care transferred to Dr. Wyvonnia Dusky to disposition following return of testing and observation time    Final Clinical Impression(s) / ED Diagnoses Final diagnoses:  Abdominal distention  Seizure [facility])    Rx / DC Orders ED Discharge Orders    None       Daleen Bo, MD 12/17/20 2252

## 2020-12-17 NOTE — ED Triage Notes (Signed)
C/o right sided and epigastric abd pain. Acute onset.   +ETOH  Deficits from previous strokes (R side, speech)

## 2020-12-17 NOTE — Telephone Encounter (Signed)
error 

## 2020-12-17 NOTE — ED Notes (Signed)
Pt moved to room 9 and placed on purewick.  Pt cleaned. New linens and gown provided.

## 2020-12-18 ENCOUNTER — Ambulatory Visit: Payer: Medicare Other | Admitting: Orthopaedic Surgery

## 2020-12-18 ENCOUNTER — Emergency Department (HOSPITAL_COMMUNITY): Payer: Medicare Other

## 2020-12-18 DIAGNOSIS — D696 Thrombocytopenia, unspecified: Secondary | ICD-10-CM | POA: Diagnosis present

## 2020-12-18 DIAGNOSIS — Z79899 Other long term (current) drug therapy: Secondary | ICD-10-CM | POA: Diagnosis not present

## 2020-12-18 DIAGNOSIS — R140 Abdominal distension (gaseous): Secondary | ICD-10-CM | POA: Diagnosis present

## 2020-12-18 DIAGNOSIS — K219 Gastro-esophageal reflux disease without esophagitis: Secondary | ICD-10-CM | POA: Diagnosis present

## 2020-12-18 DIAGNOSIS — F10129 Alcohol abuse with intoxication, unspecified: Secondary | ICD-10-CM | POA: Diagnosis present

## 2020-12-18 DIAGNOSIS — K5730 Diverticulosis of large intestine without perforation or abscess without bleeding: Secondary | ICD-10-CM | POA: Diagnosis present

## 2020-12-18 DIAGNOSIS — E871 Hypo-osmolality and hyponatremia: Secondary | ICD-10-CM | POA: Diagnosis present

## 2020-12-18 DIAGNOSIS — Z8711 Personal history of peptic ulcer disease: Secondary | ICD-10-CM | POA: Diagnosis not present

## 2020-12-18 DIAGNOSIS — Y908 Blood alcohol level of 240 mg/100 ml or more: Secondary | ICD-10-CM | POA: Diagnosis present

## 2020-12-18 DIAGNOSIS — G9341 Metabolic encephalopathy: Secondary | ICD-10-CM | POA: Diagnosis present

## 2020-12-18 DIAGNOSIS — G9340 Encephalopathy, unspecified: Secondary | ICD-10-CM | POA: Diagnosis present

## 2020-12-18 DIAGNOSIS — F1010 Alcohol abuse, uncomplicated: Secondary | ICD-10-CM | POA: Diagnosis present

## 2020-12-18 DIAGNOSIS — E872 Acidosis: Secondary | ICD-10-CM | POA: Diagnosis present

## 2020-12-18 DIAGNOSIS — Z87891 Personal history of nicotine dependence: Secondary | ICD-10-CM | POA: Diagnosis not present

## 2020-12-18 DIAGNOSIS — G40909 Epilepsy, unspecified, not intractable, without status epilepticus: Secondary | ICD-10-CM | POA: Diagnosis present

## 2020-12-18 DIAGNOSIS — Z8673 Personal history of transient ischemic attack (TIA), and cerebral infarction without residual deficits: Secondary | ICD-10-CM | POA: Diagnosis not present

## 2020-12-18 DIAGNOSIS — Z20822 Contact with and (suspected) exposure to covid-19: Secondary | ICD-10-CM | POA: Diagnosis present

## 2020-12-18 DIAGNOSIS — I10 Essential (primary) hypertension: Secondary | ICD-10-CM | POA: Diagnosis present

## 2020-12-18 LAB — RAPID URINE DRUG SCREEN, HOSP PERFORMED
Amphetamines: NOT DETECTED
Barbiturates: POSITIVE — AB
Benzodiazepines: NOT DETECTED
Cocaine: NOT DETECTED
Opiates: NOT DETECTED
Tetrahydrocannabinol: NOT DETECTED

## 2020-12-18 LAB — SALICYLATE LEVEL: Salicylate Lvl: <7 mg/dL — ABNORMAL LOW (ref 7.0–30.0)

## 2020-12-18 LAB — CBC
HCT: 30.8 % — ABNORMAL LOW (ref 36.0–46.0)
Hemoglobin: 10.1 g/dL — ABNORMAL LOW (ref 12.0–15.0)
MCH: 27.7 pg (ref 26.0–34.0)
MCHC: 32.8 g/dL (ref 30.0–36.0)
MCV: 84.4 fL (ref 80.0–100.0)
Platelets: 159 10*3/uL (ref 150–400)
RBC: 3.65 MIL/uL — ABNORMAL LOW (ref 3.87–5.11)
RDW: 16.8 % — ABNORMAL HIGH (ref 11.5–15.5)
WBC: 2.7 10*3/uL — ABNORMAL LOW (ref 4.0–10.5)
nRBC: 0 % (ref 0.0–0.2)

## 2020-12-18 LAB — FOLATE: Folate: 6.9 ng/mL (ref >5.9)

## 2020-12-18 LAB — COMPREHENSIVE METABOLIC PANEL
ALT: 30 U/L (ref 0–44)
AST: 45 U/L — ABNORMAL HIGH (ref 15–41)
Albumin: 3.7 g/dL (ref 3.5–5.0)
Alkaline Phosphatase: 120 U/L (ref 38–126)
Anion gap: 12 (ref 5–15)
BUN: 9 mg/dL (ref 8–23)
CO2: 21 mmol/L — ABNORMAL LOW (ref 22–32)
Calcium: 8.9 mg/dL (ref 8.9–10.3)
Chloride: 102 mmol/L (ref 98–111)
Creatinine, Ser: 0.61 mg/dL (ref 0.44–1.00)
GFR, Estimated: >60 mL/min (ref >60)
Glucose, Bld: 97 mg/dL (ref 70–99)
Potassium: 4.6 mmol/L (ref 3.5–5.1)
Sodium: 135 mmol/L (ref 135–145)
Total Bilirubin: 0.6 mg/dL (ref 0.3–1.2)
Total Protein: 7.8 g/dL (ref 6.5–8.1)

## 2020-12-18 LAB — TROPONIN I (HIGH SENSITIVITY)
Troponin I (High Sensitivity): 3 ng/L (ref <18)
Troponin I (High Sensitivity): 4 ng/L (ref <18)

## 2020-12-18 LAB — URINALYSIS, ROUTINE W REFLEX MICROSCOPIC
Bacteria, UA: NONE SEEN
Bilirubin Urine: NEGATIVE
Glucose, UA: NEGATIVE mg/dL
Ketones, ur: NEGATIVE mg/dL
Nitrite: NEGATIVE
Protein, ur: NEGATIVE mg/dL
Specific Gravity, Urine: 1.003 — ABNORMAL LOW (ref 1.005–1.030)
pH: 6 (ref 5.0–8.0)

## 2020-12-18 LAB — RESP PANEL BY RT-PCR (FLU A&B, COVID) ARPGX2
Influenza A by PCR: NEGATIVE
Influenza B by PCR: NEGATIVE
SARS Coronavirus 2 by RT PCR: NEGATIVE

## 2020-12-18 LAB — LACTIC ACID, PLASMA
Lactic Acid, Venous: 2 mmol/L (ref 0.5–1.9)
Lactic Acid, Venous: 2.7 mmol/L (ref 0.5–1.9)
Lactic Acid, Venous: 2.3 mmol/L (ref 0.5–1.9)
Lactic Acid, Venous: 1.7 mmol/L (ref 0.5–1.9)

## 2020-12-18 LAB — MAGNESIUM: Magnesium: 1.9 mg/dL (ref 1.7–2.4)

## 2020-12-18 LAB — AMMONIA: Ammonia: 67 umol/L — ABNORMAL HIGH (ref 9–35)

## 2020-12-18 LAB — VITAMIN B12: Vitamin B-12: 395 pg/mL (ref 180–914)

## 2020-12-18 LAB — ACETAMINOPHEN LEVEL: Acetaminophen (Tylenol), Serum: <10 ug/mL — ABNORMAL LOW (ref 10–30)

## 2020-12-18 LAB — PHOSPHORUS: Phosphorus: 4 mg/dL (ref 2.5–4.6)

## 2020-12-18 LAB — TSH: TSH: 1.214 u[IU]/mL (ref 0.350–4.500)

## 2020-12-18 MED ORDER — PANTOPRAZOLE SODIUM 40 MG PO TBEC
40.0000 mg | DELAYED_RELEASE_TABLET | Freq: Two times a day (BID) | ORAL | Status: DC
  Administered 2020-12-18 – 2020-12-19 (×3): 40 mg via ORAL
  Filled 2020-12-18 (×3): qty 1

## 2020-12-18 MED ORDER — LORAZEPAM 1 MG PO TABS: 1.0000 mg | ORAL_TABLET | ORAL | Status: DC | PRN

## 2020-12-18 MED ORDER — PHENOBARBITAL 32.4 MG PO TABS
32.4000 mg | ORAL_TABLET | Freq: Two times a day (BID) | ORAL | Status: DC
  Administered 2020-12-18 – 2020-12-19 (×3): 32.4 mg via ORAL
  Filled 2020-12-18 (×3): qty 1

## 2020-12-18 MED ORDER — LISINOPRIL 10 MG PO TABS
40.0000 mg | ORAL_TABLET | Freq: Every day | ORAL | Status: DC
  Administered 2020-12-18 – 2020-12-19 (×2): 40 mg via ORAL
  Filled 2020-12-18 (×2): qty 4

## 2020-12-18 MED ORDER — SODIUM CHLORIDE 0.9 % IV SOLN
750.0000 mg | Freq: Once | INTRAVENOUS | Status: AC
  Administered 2020-12-18: 750 mg via INTRAVENOUS
  Filled 2020-12-18: qty 15

## 2020-12-18 MED ORDER — LORAZEPAM 2 MG/ML IJ SOLN: 0.0000 mg | Freq: Four times a day (QID) | INTRAMUSCULAR | Status: DC

## 2020-12-18 MED ORDER — LORAZEPAM 2 MG/ML IJ SOLN: 0.0000 mg | Freq: Two times a day (BID) | INTRAMUSCULAR | Status: DC

## 2020-12-18 MED ORDER — ADULT MULTIVITAMIN W/MINERALS CH
1.0000 | ORAL_TABLET | Freq: Every day | ORAL | Status: DC
  Administered 2020-12-18 – 2020-12-19 (×2): 1 via ORAL
  Filled 2020-12-18 (×2): qty 1

## 2020-12-18 MED ORDER — PHENYTOIN SODIUM EXTENDED 100 MG PO CAPS: 100.0000 mg | ORAL_CAPSULE | Freq: Three times a day (TID) | ORAL | Status: DC

## 2020-12-18 MED ORDER — ASPIRIN EC 81 MG PO TBEC
81.0000 mg | DELAYED_RELEASE_TABLET | Freq: Every day | ORAL | Status: DC
  Administered 2020-12-18 – 2020-12-19 (×2): 81 mg via ORAL
  Filled 2020-12-18 (×2): qty 1

## 2020-12-18 MED ORDER — ONDANSETRON HCL 4 MG/2ML IJ SOLN: 4.0000 mg | Freq: Four times a day (QID) | INTRAMUSCULAR | Status: DC | PRN

## 2020-12-18 MED ORDER — LORAZEPAM 2 MG/ML IJ SOLN: 1.0000 mg | INTRAMUSCULAR | Status: DC | PRN

## 2020-12-18 MED ORDER — THIAMINE HCL 100 MG PO TABS
100.0000 mg | ORAL_TABLET | Freq: Every day | ORAL | Status: DC
  Administered 2020-12-18 – 2020-12-19 (×2): 100 mg via ORAL
  Filled 2020-12-18 (×2): qty 1

## 2020-12-18 MED ORDER — HEPARIN SODIUM (PORCINE) 5000 UNIT/ML IJ SOLN
5000.0000 [IU] | Freq: Three times a day (TID) | INTRAMUSCULAR | Status: DC
  Administered 2020-12-18 – 2020-12-19 (×4): 5000 [IU] via SUBCUTANEOUS
  Filled 2020-12-18 (×4): qty 1

## 2020-12-18 MED ORDER — SODIUM CHLORIDE 0.9 % IV SOLN: INTRAVENOUS | Status: DC

## 2020-12-18 MED ORDER — LACTULOSE ENEMA
300.0000 mL | Freq: Once | ORAL | Status: DC
  Filled 2020-12-18: qty 300

## 2020-12-18 MED ORDER — SODIUM CHLORIDE 0.9 % IV SOLN
125.0000 mg | Freq: Three times a day (TID) | INTRAVENOUS | Status: DC
  Administered 2020-12-18 – 2020-12-19 (×3): 125 mg via INTRAVENOUS
  Filled 2020-12-18 (×13): qty 2.5

## 2020-12-18 MED ORDER — FOLIC ACID 1 MG PO TABS
1.0000 mg | ORAL_TABLET | Freq: Every day | ORAL | Status: DC
  Administered 2020-12-18 – 2020-12-19 (×2): 1 mg via ORAL
  Filled 2020-12-18 (×2): qty 1

## 2020-12-18 MED ORDER — POLYETHYLENE GLYCOL 3350 17 G PO PACK: 17.0000 g | PACK | Freq: Every day | ORAL | Status: DC | PRN

## 2020-12-18 MED ORDER — LACTULOSE 10 GM/15ML PO SOLN
30.0000 g | Freq: Once | ORAL | Status: AC
  Administered 2020-12-18: 30 g via ORAL
  Filled 2020-12-18: qty 60

## 2020-12-18 MED ORDER — ACETAMINOPHEN 650 MG RE SUPP: 650.0000 mg | Freq: Four times a day (QID) | RECTAL | Status: DC | PRN

## 2020-12-18 MED ORDER — DARIFENACIN HYDROBROMIDE ER 7.5 MG PO TB24
15.0000 mg | ORAL_TABLET | Freq: Every day | ORAL | Status: DC
  Administered 2020-12-18 – 2020-12-19 (×2): 15 mg via ORAL
  Filled 2020-12-18 (×2): qty 2

## 2020-12-18 MED ORDER — OXYCODONE HCL 5 MG PO TABS: 5.0000 mg | ORAL_TABLET | ORAL | Status: DC | PRN

## 2020-12-18 MED ORDER — METHOCARBAMOL 1000 MG/10ML IJ SOLN
500.0000 mg | Freq: Four times a day (QID) | INTRAVENOUS | Status: DC | PRN
  Filled 2020-12-18: qty 5

## 2020-12-18 MED ORDER — ACETAMINOPHEN 325 MG PO TABS: 650.0000 mg | ORAL_TABLET | Freq: Four times a day (QID) | ORAL | Status: DC | PRN

## 2020-12-18 MED ORDER — PHENYTOIN SODIUM 50 MG/ML IJ SOLN
INTRAMUSCULAR | Status: AC
  Filled 2020-12-18: qty 15

## 2020-12-18 MED ORDER — ALBUTEROL SULFATE HFA 108 (90 BASE) MCG/ACT IN AERS: 2.0000 | INHALATION_SPRAY | Freq: Four times a day (QID) | RESPIRATORY_TRACT | Status: DC | PRN

## 2020-12-18 MED ORDER — THIAMINE HCL 100 MG/ML IJ SOLN: 100.0000 mg | Freq: Every day | INTRAMUSCULAR | Status: DC

## 2020-12-18 MED ORDER — KETOROLAC TROMETHAMINE 30 MG/ML IJ SOLN: 30.0000 mg | Freq: Four times a day (QID) | INTRAMUSCULAR | Status: DC | PRN

## 2020-12-18 MED ORDER — LACTATED RINGERS IV BOLUS
1000.0000 mL | Freq: Once | INTRAVENOUS | Status: AC
  Administered 2020-12-18: 1000 mL via INTRAVENOUS

## 2020-12-18 MED ORDER — ENSURE ENLIVE PO LIQD
237.0000 mL | Freq: Two times a day (BID) | ORAL | Status: DC
  Administered 2020-12-18 – 2020-12-19 (×3): 237 mL via ORAL

## 2020-12-18 MED ORDER — ONDANSETRON HCL 4 MG PO TABS: 4.0000 mg | ORAL_TABLET | Freq: Four times a day (QID) | ORAL | Status: DC | PRN

## 2020-12-18 NOTE — Progress Notes (Addendum)
PROGRESS NOTE    Patient: [name]                            PCP: [name], MD                    DOB: 1956/07/10            DOA: 12/17/2020 [id number]             DOS: 12/18/2020, 11:20 AM   LOS: 0 days   Date of Service: The patient was seen and examined on 12/18/2020  Subjective:   The patient was seen and examined this morning, remained somnolent, but was able to follow some command. Right upper extremity contraction, right lower extremity weakness - with H/o previous stroke ... Admits to drinking alcohol but states does not drink every day Vitals are stable this morning Lactic acid still elevated 2.7  Brief Narrative:   [name]  is a 65 y.o. female, with medical history significant for stroke-residual right-sided weakness and dysarthria, seizures, peptic ulcer disease, hypertension, GERD, alcohol abuse presents to the ED with a chief complaint of confusion.  Patient reports that she does not know why she is here.  ED provider was able to speak with caretaker who reported that she had a seizure today, she was weak and confused afterwards with decreased responsiveness, so he wanted her to come into the ED.  Patient came via EMS.  Patient has no recollection of having a seizure.  She does report that she had lost time from today.  She reports that at home her main concern for the past few days-patient is unsure how many days exactly-has been abdominal pain.  She reports periumbilical abdominal pain that feels like cramping, burning, twisting, and sharp.  She reports associated nausea and throwing up.  She reports she has been throwing up every time she eats.  Her emesis is green and yellow, but nonbloody.  She reports that she is also having a bowel movement every time she eats.  They are not loose stools though per her report.  No hematochezia no melena.  Patient reports that she cannot remember when her last normal meal was.  She reports that she is having chest pain,  from a rib, and reports that it is chronic.  Patient has no other complaints at this time.  Patient had an alcohol level today of 260.  She reports that she drinks "a few times a week," and its 2 beers each time.  When asked, she drank today before she came to the hospital she was mumbling under her breath trying to count, and ended up reporting that she had a sixpack today.  I think this number is probably low.  Patient does not smoke or do illicit drugs per her report.  Patient reports that she has been taking her seizure medication.  Her seizures do not have an aura, says she has no way of telling when she is going to have one or when she did have 1.  Patient reports that she did get vaccinated for COVID.  Unclear if she had 2 or 3 vaccines.  Patient is full code.   In the ED Temperature 96.8, heart rate 81, respiratory 18, blood pressure 130/70, satting 100% Negative COVID UDS positive for barbiturates as expected from her medications CT head showed no acute intra-abdominal pathology.  No definite radiographic explanation for the patient's reported symptoms.  Cirrhosis is mention, but no obvious stigmata of portal venous hypertension.  No hepatic mass.  Severe distal colonic diverticulosis. CT head shows no acute intracranial abnormality.  Findings consistent with age-related atrophy and chronic small vessel ischemia.  Stable areas of encephalomalacia     Assessment & Plan:   Active Problems:   Acute metabolic encephalopathy   Acute metabolic encephalopathy 1. Possible seizures, postictal state, elevated ammonia, alcohol intoxication likely all contributing 2. Mental status improving 3. CT head is without acute intracranial abnormality 4. Does not report any infectious symptoms, UA pending, chest x-ray shows no active disease 5. Patient is hyponatremic with a sodium of 127, but she chronically has hyponatremia so this is not likely contributing >>> sodium is improved to one  thirty-five 6. TSH, Tylenol level, salicylate level >> within normal limits  Metabolic acidosis - lactic acidosis  -No signs of sepsis, infection -We will continue with IV fluid resuscitation changing from normal saline to lactated Ringer -Monitor repeating lactic acid  hyponatremia 1. Sodium on admission 127  >>>> one thirty-five this a.m. 2.  Baseline seems to be around 134 3. We will continue it normal saline 75 mL's per hour 4. Repeat labs  Alcohol abuse 1. Alcohol level is 260 2. With acute intoxication withdrawals are unlikely but CIWA protocol ordered as needed 3. We will continue to monitor closely  Seizure disorder 1. Continue phenobarbital 2. Dilantin level --drawn on admission was low 3. Seizure precautions 4. Continue to monitor  Abdominal pain 1. CT abdomen reviewed no acute findings 2. Abdominal pain improved this morning 3. No signs of ischemia-benign abdominal exam this a.m. 4. Diet ordered, Zofran if needed  Hypertension 1. Continue lisinopril  Thrombocytopenia 1. In the setting of alcohol abuse 2. Monitoring closely,  History of CVA -Right-sided weakness, right upper extremity contracture -Continue to monitor  -Not on aspirin at home, will initiate  --------------------------------------------------------------------------------------------------------------------------------------- Nutritional status:  The patient's BMI is: Body mass index is 27.46 kg/m. I agree with the assessment and plan as outlined ...   Consultants: None --------------------------------------------------------------------------------------------------------------------------------------- DVT prophylaxis:  SCD/Compression stockings Code Status:   Code Status: Full Code  Family Communication: No family member present at bedside - attempt will be made to update daily The above findings and plan of care has been discussed with patient in detail,  expressed understanding  and agreement of above. -Advance care planning has been discussed.   Admission status:   Status is: Inpatient   Patient remain admitted as inpatient due encephalopathy, metabolic acidosis, electrolyte abnormalities, and seizure.     Dispo: The patient is from: Home              Anticipated d/c is to: Home with HH in 1-2 days               Patient currently is not medically stable to d/c.   Difficult to place patient No      Level of care: Med-Surg   Procedures:   No admission procedures for hospital encounter.     Antimicrobials:  Anti-infectives (From admission, onward)   None       Medication:  . darifenacin  15 mg Oral Daily  . feeding supplement  237 mL Oral BID BM  . folic acid  1 mg Oral Daily  . heparin  5,000 Units Subcutaneous Q8H  . lisinopril  40 mg Oral Daily  . LORazepam  0-4 mg Intravenous Q6H   Followed by  . [START ON 12/20/2020] LORazepam  0-4 mg Intravenous Q12H  . multivitamin with minerals  1 tablet Oral Daily  . pantoprazole  40 mg Oral BID AC  . PHENobarbital  32.4 mg Oral BID  . thiamine  100 mg Oral Daily   Or  . thiamine  100 mg Intravenous Daily    acetaminophen **OR** acetaminophen, albuterol, ketorolac, LORazepam **OR** LORazepam, methocarbamol (ROBAXIN) IV, ondansetron **OR** ondansetron (ZOFRAN) IV, oxyCODONE, polyethylene glycol   Objective:   Vitals:   12/17/20 2318 12/18/20 0209 12/18/20 0330 12/18/20 0421  BP: 130/70 (!) 144/64 (!) 145/63 128/62  Pulse: 83 82 85 90  Resp: 18 14 18 17   Temp:  (!) 97.5 F (36.4 C) (!) 97.5 F (36.4 C) (!) 97.4 F (36.3 C)  TempSrc:  Oral Oral Oral  SpO2: 100% 100% 98% 100%  Weight:      Height:        Intake/Output Summary (Last 24 hours) at 12/18/2020 1120 Last data filed at 12/18/2020 0330 Gross per 24 hour  Intake 180 ml  Output 1500 ml  Net -1320 ml   Filed Weights   12/17/20 2040  Weight: 72.6 kg     Examination:   Physical Exam  Constitution:  Lethargic  HEENT:  Normocephalic, PERRL, otherwise with in Normal limits  Chest:Chest symmetric Cardio vascular:  S1/S2, RRR, No murmure, No Rubs or Gallops  pulmonary: Clear to auscultation bilaterally, respirations unlabored, negative wheezes / crackles Abdomen: Soft, non-tender, non-distended, bowel sounds,no masses, no organomegaly Muscular skeletal: Limited exam - in bed, able to move all 4 extremities, Normal strength,  Neuro: CNII-XII intact. , normal motor and sensation, reflexes intact  Extremities: No pitting edema lower extremities, +2 pulses  Skin: Dry, warm to touch, negative for any Rashes, No open wounds Wounds: per nursing documentation    ------------------------------------------------------------------------------------------------------------------------------------------    LABs:  CBC Latest Ref Rng & Units 12/18/2020 12/17/2020 07/16/2020  WBC 4.0 - 10.5 K/uL 2.7(L) 3.3(L) 3.7(L)  Hemoglobin 12.0 - 15.0 g/dL 10.1(L) 10.4(L) 10.2(L)  Hematocrit 36.0 - 46.0 % 30.8(L) 31.7(L) 30.3(L)  Platelets 150 - 400 K/uL 159 145(L) 152   CMP Latest Ref Rng & Units 12/18/2020 12/17/2020 08/15/2020  Glucose 70 - 99 mg/dL 97 105(H) 107(H)  BUN 8 - 23 mg/dL 9 8 9   Creatinine 0.44 - 1.00 mg/dL 0.61 0.65 0.70  Sodium 135 - 145 mmol/L 135 127(L) 134(L)  Potassium 3.5 - 5.1 mmol/L 4.6 4.4 4.5  Chloride 98 - 111 mmol/L 102 97(L) 99  CO2 22 - 32 mmol/L 21(L) 19(L) 25  Calcium 8.9 - 10.3 mg/dL 8.9 8.7(L) 9.7  Total Protein 6.5 - 8.1 g/dL 7.8 7.8 8.1  Total Bilirubin 0.3 - 1.2 mg/dL 0.6 0.4 0.6  Alkaline Phos 38 - 126 U/L 120 123 -  AST 15 - 41 U/L 45(H) 49(H) 36(H)  ALT 0 - 44 U/L 30 30 21        Micro Results Recent Results (from the past 240 hour(s))  Resp Panel by RT-PCR (Flu A&B, Covid) Nasopharyngeal Swab     Status: None   Collection Time: 12/17/20 11:07 PM   Specimen: Nasopharyngeal Swab; Nasopharyngeal(NP) swabs in vial transport medium  Result Value Ref Range Status   SARS Coronavirus 2 by RT  PCR NEGATIVE NEGATIVE Final    Comment: (NOTE) SARS-CoV-2 target nucleic acids are NOT DETECTED.  The SARS-CoV-2 RNA is generally detectable in upper respiratory specimens during the acute phase of infection. The lowest concentration of SARS-CoV-2 viral copies this assay can detect is 138 copies/mL.  A negative result does not preclude SARS-Cov-2 infection and should not be used as the sole basis for treatment or other patient management decisions. A negative result may occur with  improper specimen collection/handling, submission of specimen other than nasopharyngeal swab, presence of viral mutation(s) within the areas targeted by this assay, and inadequate number of viral copies(<138 copies/mL). A negative result must be combined with clinical observations, patient history, and epidemiological information. The expected result is Negative.  Fact Sheet for Patients:  EntrepreneurPulse.com.au  Fact Sheet for Healthcare Providers:  IncredibleEmployment.be  This test is no t yet approved or cleared by the [place] FDA and  has been authorized for detection and/or diagnosis of SARS-CoV-2 by FDA under an Emergency Use Authorization (EUA). This EUA will remain  in effect (meaning this test can be used) for the duration of the COVID-19 declaration under Section 564(b)(1) of the Act, 21 U.S.C.section 360bbb-3(b)(1), unless the authorization is terminated  or revoked sooner.       Influenza A by PCR NEGATIVE NEGATIVE Final   Influenza B by PCR NEGATIVE NEGATIVE Final    Comment: (NOTE) The Xpert Xpress SARS-CoV-2/FLU/RSV plus assay is intended as an aid in the diagnosis of influenza from Nasopharyngeal swab specimens and should not be used as a sole basis for treatment. Nasal washings and aspirates are unacceptable for Xpert Xpress SARS-CoV-2/FLU/RSV testing.  Fact Sheet for Patients: EntrepreneurPulse.com.au  Fact Sheet for  Healthcare Providers: IncredibleEmployment.be  This test is not yet approved or cleared by the [place] FDA and has been authorized for detection and/or diagnosis of SARS-CoV-2 by FDA under an Emergency Use Authorization (EUA). This EUA will remain in effect (meaning this test can be used) for the duration of the COVID-19 declaration under Section 564(b)(1) of the Act, 21 U.S.C. section 360bbb-3(b)(1), unless the authorization is terminated or revoked.  Performed at [facility], [address], [place], [place] [address]     Radiology Reports CT Head Wo Contrast  Result Date: 12/18/2020 CLINICAL DATA:  Change in mental status prior stroke EXAM: CT HEAD WITHOUT CONTRAST TECHNIQUE: Contiguous axial images were obtained from the base of the skull through the vertex without intravenous contrast. COMPARISON:  April 02, 2020 FINDINGS: Brain: No evidence of acute territorial infarction, hemorrhage, hydrocephalus,extra-axial collection or mass lesion/mass effect. Again noted are stable areas of encephalomalacia involving the bifrontal lobes and left temporal lobe. Stable clip is seen within the inferior right temporal lobe. Vascular: No hyperdense vessel or unexpected calcification. Skull: Again noted is a prior right frontal craniotomy. No fracture or focal lesion identified. Sinuses/Orbits: The visualized paranasal sinuses and mastoid air cells are clear. The orbits and globes intact. Other: None IMPRESSION: No acute intracranial abnormality. Findings consistent with age related atrophy and chronic small vessel ischemia Stable areas of encephalomalacia Electronically Signed   By: Prudencio Pair M.D.   On: 12/18/2020 01:33   CT Abdomen Pelvis W Contrast  Result Date: 12/18/2020 CLINICAL DATA:  Right-sided epigastric abdominal pain EXAM: CT ABDOMEN AND PELVIS WITH CONTRAST TECHNIQUE: Multidetector CT imaging of the abdomen and pelvis was performed using the standard protocol following  bolus administration of intravenous contrast. CONTRAST:  155mL OMNIPAQUE IOHEXOL 300 MG/ML  SOLN COMPARISON:  10/23/2019 FINDINGS: Lower chest: The visualized lung bases are clear. The visualized heart and pericardium are unremarkable. Small hiatal hernia. Hepatobiliary: The liver contour is nodular in keeping with cirrhosis. No focal intrahepatic mass identified. Gallbladder unremarkable. No intra or extrahepatic biliary ductal dilation. Pancreas: Unremarkable Spleen: Unremarkable Adrenals/Urinary  Tract: Adrenal glands are unremarkable. Kidneys are normal, without renal calculi, focal lesion, or hydronephrosis. Bladder is unremarkable. Stomach/Bowel: Severe descending and sigmoid colonic diverticulosis. The stomach, small bowel, and large bowel are otherwise unremarkable. Appendix normal. No free intraperitoneal gas or fluid. Vascular/Lymphatic: Mild atherosclerotic calcification within the abdominal aorta. No aortic aneurysm. Circumaortic left renal vein. No pathologic adenopathy within the abdomen and pelvis. Reproductive: Uterus absent. No adnexal masses. Fluid noted within the vaginal cavity. Other: No abdominal wall hernia.  Rectum unremarkable. Musculoskeletal: No acute bone abnormality. Degenerative changes are seen within the lumbar spine. Degenerative changes are noted within the hips bilaterally. No lytic or blastic bone lesions. IMPRESSION: No acute intra-abdominal pathology. No definite radiographic explanation for the patient's reported symptoms. Cirrhosis. No obvious stigmata of portal venous hypertension. No hepatic mass identified. Severe distal colonic diverticulosis. Aortic Atherosclerosis (ICD10-I70.0). Electronically Signed   By: Fidela Salisbury MD   On: 12/18/2020 00:19   DG Chest Port 1 View  Result Date: 12/17/2020 CLINICAL DATA:  Weakness, epigastric abdominal pain EXAM: PORTABLE CHEST 1 VIEW COMPARISON:  11/01/2019 FINDINGS: The heart size and mediastinal contours are within normal  limits. Both lungs are clear. The visualized skeletal structures are unremarkable. IMPRESSION: No active disease. Electronically Signed   By: Randa Ngo M.D.   On: 12/17/2020 22:52    SIGNED: Deatra James, MD, FHM. [facility],  Pager (please use [email or web address] to page/text) Please use Epic Secure Chat for non-urgent communication (7AM-7PM)  If 7PM-7AM, please contact night-coverage [email or web address], 12/18/2020, 11:20 AM

## 2020-12-18 NOTE — Progress Notes (Signed)
Date and time results received: 12/18/20 @ 0828  Test: lactic acid Critical Value: 2.7  Name of Provider Notified: Cherlyn Labella, MD   Orders Received? N/a Deirdre Pippins, RN

## 2020-12-18 NOTE — Care Management Obs Status (Signed)
[facility] NOTIFICATION   Patient Details  Name: [name] MRN: [id number] Date of Birth: 1956/06/22   Medicare Observation Status Notification Given:  Yes    Tommy Medal 12/18/2020, 3:47 PM

## 2020-12-18 NOTE — H&P (Signed)
TRH H&P    Patient Demographics:    [name], is a 65 y.o. female  MRN: [id number]  DOB - 12/12/1955  Admit Date - 12/17/2020  Referring MD/NP/PA: Rancour  Outpatient Primary MD for the patient is [name], MD  Patient coming from: Home  Chief complaint- confusion   HPI:    [name]  is a 65 y.o. female, with medical history significant for stroke-residual right-sided weakness and dysarthria, seizures, peptic ulcer disease, hypertension, GERD, alcohol abuse presents to the ED with a chief complaint of confusion.  Patient reports that she does not know why she is here.  ED provider was able to speak with caretaker who reported that she had a seizure today, she was weak and confused afterwards with decreased responsiveness, so he wanted her to come into the ED.  Patient came via EMS.  Patient has no recollection of having a seizure.  She does report that she had lost time from today.  She reports that at home her main concern for the past few days-patient is unsure how many days exactly-has been abdominal pain.  She reports periumbilical abdominal pain that feels like cramping, burning, twisting, and sharp.  She reports associated nausea and throwing up.  She reports she has been throwing up every time she eats.  Her emesis is green and yellow, but nonbloody.  She reports that she is also having a bowel movement every time she eats.  They are not loose stools though per her report.  No hematochezia no melena.  Patient reports that she cannot remember when her last normal meal was.  She reports that she is having chest pain, from a rib, and reports that it is chronic.  Patient has no other complaints at this time.  Patient had an alcohol level today of 260.  She reports that she drinks "a few times a week," and its 2 beers each time.  When asked, she drank today before she came to the hospital she was  mumbling under her breath trying to count, and ended up reporting that she had a sixpack today.  I think this number is probably low.  Patient does not smoke or do illicit drugs per her report.  Patient reports that she has been taking her seizure medication.  Her seizures do not have an aura, says she has no way of telling when she is going to have one or when she did have 1.  Patient reports that she did get vaccinated for COVID.  Unclear if she had 2 or 3 vaccines.  Patient is full code.  In the ED Temperature 96.8, heart rate 81, respiratory 18, blood pressure 130/70, satting 100% Negative COVID UDS positive for barbiturates as expected from her medications CT head showed no acute intra-abdominal pathology.  No definite radiographic explanation for the patient's reported symptoms.  Cirrhosis is mention, but no obvious stigmata of portal venous hypertension.  No hepatic mass.  Severe distal colonic diverticulosis. CT head shows no acute intracranial abnormality.  Findings consistent with age-related atrophy and chronic  small vessel ischemia.  Stable areas of encephalomalacia Admission requested for acute metabolic encephalopathy   Review of systems:    In addition to the HPI above,  No Fever-chills, No Headache, No changes with Vision or hearing, No problems swallowing food or Liquids, No new chest pain, Cough or Shortness of Breath, No Blood in stool or Urine, No dysuria, No new skin rashes or bruises, No new joints pains-aches,  No new weakness, tingling, numbness in any extremity, No recent weight gain or loss, No polyuria, polydypsia or polyphagia, No significant Mental Stressors.  All other systems reviewed and are negative.    Past History of the following :    Past Medical History:  Diagnosis Date  . Alcohol abuse   . Asthma   . Contracture of muscle of hand    right  . GERD (gastroesophageal reflux disease)   . HTN (hypertension)   . PUD (peptic ulcer disease)     remote  . Seizures ([facility])    since stroke, no recent seizures  . Stroke [facility])    age 33, required brain surgery effected right side      Past Surgical History:  Procedure Laterality Date  . ABDOMINAL HYSTERECTOMY  2004   complete  . BRAIN SURGERY     age 15 stroke  . COLONOSCOPY    . COLONOSCOPY WITH PROPOFOL  10/03/2012   Dr. [name]: moderate diverticulosis, small internal hemorrhoids, TUBULAR ADENOMA. Next colonoscopy 2023 per Dr. [name].  . ESOPHAGOGASTRODUODENOSCOPY N/A 10/18/2014   Dr. Gala Romney during inpatient hospitalization: severe exudative/erosive reflux esophagitis as source of trivial upper GI bleed. No varices   . ESOPHAGOGASTRODUODENOSCOPY (EGD) WITH PROPOFOL  10/03/2012   Dr. [name]: stricture at [place] junction s/p dilation, mild gastritis negative H.pylori   . ESOPHAGOGASTRODUODENOSCOPY (EGD) WITH PROPOFOL N/A 12/27/2017   Grade D esophagitis. Medium-sized hiatal hernia, normal duodenum  . right tib-fib fracture  2008  . SAVORY DILATION  10/03/2012   Procedure: SAVORY DILATION;  Surgeon: Danie Binder, MD;  Location: [facility];  Service: Endoscopy;  Laterality: N/A;  started at 1303,  dilated 12.8-16mm      Social History:      Social History   Tobacco Use  . Smoking status: Former Smoker    Packs/day: 0.04    Years: 9.00    Pack years: 0.36    Types: Cigarettes    Quit date: 07/28/2014    Years since quitting: 6.3  . Smokeless tobacco: Never Used  Substance Use Topics  . Alcohol use: Yes    Alcohol/week: 35.0 standard drinks    Types: 35 Cans of beer per week    Comment: Patient reports she drinks "a lot" of beer each day (07/11/2020)       Family History :     Family History  Problem Relation Age of Onset  . Liver disease Sister        etoh  . Colon cancer Neg Hx        Home Medications:   Prior to Admission medications   Medication Sig Start Date End Date Taking? Authorizing Provider  acetaminophen (TYLENOL) 325 MG tablet Take 325-650 mg by  mouth every 6 (six) hours as needed for moderate pain.     [provider]  albuterol (PROVENTIL HFA;VENTOLIN HFA) 108 (90 BASE) MCG/ACT inhaler Inhale 2 puffs into the lungs every 6 (six) hours as needed for wheezing or shortness of breath.     [provider]  albuterol (PROVENTIL) (2.5 MG/3ML)  0.083% nebulizer solution Take 2.5 mg by nebulization every 6 (six) hours as needed for wheezing or shortness of breath.     [provider]  ENSURE PLUS (ENSURE PLUS) LIQD Take 237 mLs by mouth 2 (two) times daily between meals.     [provider]  folic acid (FOLVITE) 144 MCG tablet Take 400 mcg by mouth daily.    [provider]  Homeopathic Products (LEG CRAMP RELIEF PO) Take 1 tablet by mouth 3 (three) times daily as needed (leg cramps).    [provider]  HYDROcodone-acetaminophen (NORCO/VICODIN) 5-325 MG tablet One tablet every six hours for pain.  Limit 7 days. 11/06/20   Sanjuana Kava, MD  linaclotide [facility]) 290 MCG CAPS capsule Take 1 capsule (290 mcg total) by mouth daily before breakfast. 09/26/20   [name], PA-C  lisinopril (ZESTRIL) 40 MG tablet Take 1 tablet (40 mg total) by mouth daily. 10/09/19   Roxan Hockey, MD  Multiple Vitamin (MULTIVITAMIN WITH MINERALS) TABS tablet Take 1 tablet by mouth daily. 04/14/19   Johnson, Clanford L, MD  pantoprazole (PROTONIX) 40 MG tablet Take 1 tablet (40 mg total) by mouth 2 (two) times daily before a meal. 11/05/19   Gosrani, Nimish C, MD  PHENobarbital (LUMINAL) 32.4 MG tablet Take 32.4 mg by mouth 2 (two) times daily. 07/13/19   [provider]  phenytoin (DILANTIN) 100 MG ER capsule Take 100 mg by mouth 3 (three) times daily.    [provider]  potassium chloride SA (KLOR-CON) 20 MEQ tablet Take 20 mEq by mouth 3 (three) times daily. 08/25/20   [provider]  solifenacin (VESICARE) 5 MG tablet Take 5 mg by mouth daily. 03/11/20   [provider]      Allergies:    No Known Allergies   Physical Exam:   Vitals  Blood pressure (!) 144/64, pulse 82, temperature (!) 97.5 F (36.4 C), temperature source Oral, resp. rate 14, height 5\' 4"  (1.626 m), weight 72.6 kg, SpO2 100 %.  1.  General: Patient lying supine in bed in no acute distress  2. Psychiatric: Oriented to place and self but not to time.  Cooperative with exam  3. Neurologic: Face is symmetric, no movement of right leg, very minute movement of right arm, right hand contracted, dysarthric speech, all of the above is chronic.  No acute deficit on limited exam  4. HEENMT:  Head is atraumatic, normocephalic, pupils reactive to light, neck is supple, trachea is midline  5. Respiratory : Lungs are clear to auscultation bilaterally without wheeze, crackles, rhonchi  6. Cardiovascular : Heart rate is normal, rhythm is regular, no murmurs rubs or gallops  7. Gastrointestinal:  Abdomen is soft, nondistended, nontender to palpation, bowel sounds active, patient asking for food  8. Skin:  Skin is warm dry and intact without acute lesion on limited exam  9.Musculoskeletal:  Contracture of right hand as mentioned above, no acute deformities, no calf tenderness    Data Review:    CBC Recent Labs  Lab 12/17/20 2252  WBC 3.3*  HGB 10.4*  HCT 31.7*  PLT 145*  MCV 84.8  MCH 27.8  MCHC 32.8  RDW 17.0*  LYMPHSABS 1.4  MONOABS 0.4  EOSABS 0.0  BASOSABS 0.0   ------------------------------------------------------------------------------------------------------------------  Results for orders placed or performed during the hospital encounter of 12/17/20 (from the past 48 hour(s))  Comprehensive metabolic panel     Status: Abnormal   Collection Time: 12/17/20 10:52 PM  Result  Value Ref Range   Sodium 127 (L) 135 - 145 mmol/L   Potassium 4.4 3.5 - 5.1 mmol/L   Chloride 97 (L) 98 - 111 mmol/L   CO2 19 (L) 22 - 32 mmol/L   Glucose, Bld 105 (H) 70 - 99 mg/dL     Comment: Glucose reference range applies only to samples taken after fasting for at least 8 hours.   BUN 8 8 - 23 mg/dL   Creatinine, Ser 0.65 0.44 - 1.00 mg/dL   Calcium 8.7 (L) 8.9 - 10.3 mg/dL   Total Protein 7.8 6.5 - 8.1 g/dL   Albumin 3.7 3.5 - 5.0 g/dL   AST 49 (H) 15 - 41 U/L   ALT 30 0 - 44 U/L   Alkaline Phosphatase 123 38 - 126 U/L   Total Bilirubin 0.4 0.3 - 1.2 mg/dL   GFR, Estimated >60 >60 mL/min    Comment: (NOTE) Calculated using the CKD-EPI Creatinine Equation (2021)    Anion gap 11 5 - 15    Comment: Performed at [facility], [address] [place], [place] [address]  Ethanol     Status: Abnormal   Collection Time: 12/17/20 10:52 PM  Result Value Ref Range   Alcohol, Ethyl (B) 260 (H) <10 mg/dL    Comment: (NOTE) Lowest detectable limit for serum alcohol is 10 mg/dL.  For medical purposes only. Performed at [facility], [address] [place], [place] [address]   CBC with Differential     Status: Abnormal   Collection Time: 12/17/20 10:52 PM  Result Value Ref Range   WBC 3.3 (L) 4.0 - 10.5 K/uL   RBC 3.74 (L) 3.87 - 5.11 MIL/uL   Hemoglobin 10.4 (L) 12.0 - 15.0 g/dL   HCT 31.7 (L) 36.0 - 46.0 %   MCV 84.8 80.0 - 100.0 fL   MCH 27.8 26.0 - 34.0 pg   MCHC 32.8 30.0 - 36.0 g/dL   RDW 17.0 (H) 11.5 - 15.5 %   Platelets 145 (L) 150 - 400 K/uL   nRBC 0.0 0.0 - 0.2 %   Neutrophils Relative % 44 %   Neutro Abs 1.5 (L) 1.7 - 7.7 K/uL   Lymphocytes Relative 43 %   Lymphs Abs 1.4 0.7 - 4.0 K/uL   Monocytes Relative 11 %   Monocytes Absolute 0.4 0.1 - 1.0 K/uL   Eosinophils Relative 1 %   Eosinophils Absolute 0.0 0.0 - 0.5 K/uL   Basophils Relative 1 %   Basophils Absolute 0.0 0.0 - 0.1 K/uL   Immature Granulocytes 0 %   Abs Immature Granulocytes 0.01 0.00 - 0.07 K/uL    Comment: Performed at [facility], [address], [place], [place] [address]  Blood gas, venous     Status: Abnormal   Collection Time: 12/17/20 10:52 PM  Result Value Ref Range   FIO2  21.00    pH, Ven 7.362 7.250 - 7.430   pCO2, Ven 39.5 (L) 44.0 - 60.0 mmHg   pO2, Ven <31.0 (LL) 32.0 - 45.0 mmHg    Comment: CRITICAL RESULT CALLED TO, READ BACK BY AND VERIFIED WITH: T WALKER,RN@[id number]  12/17/20 MKELLY    Bicarbonate 21.1 20.0 - 28.0 mmol/L   Acid-base deficit 2.6 (H) 0.0 - 2.0 mmol/L   O2 Saturation 35.6 %   Patient temperature 36.0    Collection site VENOUS    Drawn by 6000     Comment: Performed at [facility], [address], [place], [place] [address]  Ammonia  Status: Abnormal   Collection Time: 12/17/20 10:52 PM  Result Value Ref Range   Ammonia 62 (H) 9 - 35 umol/L    Comment: Performed at [facility], [address] [place], [place] [address]  Phenytoin level, total     Status: Abnormal   Collection Time: 12/17/20 10:52 PM  Result Value Ref Range   Phenytoin Lvl 5.0 (L) 10.0 - 20.0 ug/mL    Comment: Performed at [facility], [address], [place], [place] [address]  Troponin I (High Sensitivity)     Status: None   Collection Time: 12/17/20 10:52 PM  Result Value Ref Range   Troponin I (High Sensitivity) 3 <18 ng/L    Comment: (NOTE) Elevated high sensitivity troponin I (hsTnI) values and significant  changes across serial measurements may suggest ACS but many other  chronic and acute conditions are known to elevate hsTnI results.  Refer to the "Links" section for chest pain algorithms and additional  guidance. Performed at [facility], [address], [place], [place] [address]   Urine rapid drug screen (hosp performed)     Status: Abnormal   Collection Time: 12/17/20 11:07 PM  Result Value Ref Range   Opiates NONE DETECTED NONE DETECTED   Cocaine NONE DETECTED NONE DETECTED   Benzodiazepines NONE DETECTED NONE DETECTED   Amphetamines NONE DETECTED NONE DETECTED   Tetrahydrocannabinol NONE DETECTED NONE DETECTED   Barbiturates POSITIVE (A) NONE DETECTED    Comment: (NOTE) DRUG SCREEN FOR MEDICAL PURPOSES ONLY.  IF CONFIRMATION IS  NEEDED FOR ANY PURPOSE, NOTIFY LAB WITHIN 5 DAYS.  LOWEST DETECTABLE LIMITS FOR URINE DRUG SCREEN Drug Class                     Cutoff (ng/mL) Amphetamine and metabolites    1000 Barbiturate and metabolites    200 Benzodiazepine                 573 Tricyclics and metabolites     300 Opiates and metabolites        300 Cocaine and metabolites        300 THC                            50 Performed at [facility], [address], [place], [place] [address]   Resp Panel by RT-PCR (Flu A&B, Covid) Nasopharyngeal Swab     Status: None   Collection Time: 12/17/20 11:07 PM   Specimen: Nasopharyngeal Swab; Nasopharyngeal(NP) swabs in vial transport medium  Result Value Ref Range   SARS Coronavirus 2 by RT PCR NEGATIVE NEGATIVE    Comment: (NOTE) SARS-CoV-2 target nucleic acids are NOT DETECTED.  The SARS-CoV-2 RNA is generally detectable in upper respiratory specimens during the acute phase of infection. The lowest concentration of SARS-CoV-2 viral copies this assay can detect is 138 copies/mL. A negative result does not preclude SARS-Cov-2 infection and should not be used as the sole basis for treatment or other patient management decisions. A negative result may occur with  improper specimen collection/handling, submission of specimen other than nasopharyngeal swab, presence of viral mutation(s) within the areas targeted by this assay, and inadequate number of viral copies(<138 copies/mL). A negative result must be combined with clinical observations, patient history, and epidemiological information. The expected result is Negative.  Fact Sheet for Patients:  EntrepreneurPulse.com.au  Fact Sheet for Healthcare Providers:  IncredibleEmployment.be  This test is no t yet approved or  cleared by the [place] and  has been authorized for detection and/or diagnosis of SARS-CoV-2 by FDA under an Emergency Use Authorization (EUA). This EUA will  remain  in effect (meaning this test can be used) for the duration of the COVID-19 declaration under Section 564(b)(1) of the Act, 21 U.S.C.section 360bbb-3(b)(1), unless the authorization is terminated  or revoked sooner.       Influenza A by PCR NEGATIVE NEGATIVE   Influenza B by PCR NEGATIVE NEGATIVE    Comment: (NOTE) The Xpert Xpress SARS-CoV-2/FLU/RSV plus assay is intended as an aid in the diagnosis of influenza from Nasopharyngeal swab specimens and should not be used as a sole basis for treatment. Nasal washings and aspirates are unacceptable for Xpert Xpress SARS-CoV-2/FLU/RSV testing.  Fact Sheet for Patients: EntrepreneurPulse.com.au  Fact Sheet for Healthcare Providers: IncredibleEmployment.be  This test is not yet approved or cleared by the [place] FDA and has been authorized for detection and/or diagnosis of SARS-CoV-2 by FDA under an Emergency Use Authorization (EUA). This EUA will remain in effect (meaning this test can be used) for the duration of the COVID-19 declaration under Section 564(b)(1) of the Act, 21 U.S.C. section 360bbb-3(b)(1), unless the authorization is terminated or revoked.  Performed at [facility], [address], [place], [place] [address]   Troponin I (High Sensitivity)     Status: None   Collection Time: 12/18/20  1:21 AM  Result Value Ref Range   Troponin I (High Sensitivity) 4 <18 ng/L    Comment: (NOTE) Elevated high sensitivity troponin I (hsTnI) values and significant  changes across serial measurements may suggest ACS but many other  chronic and acute conditions are known to elevate hsTnI results.  Refer to the "Links" section for chest pain algorithms and additional  guidance. Performed at [facility], [address] [place], [place] [address]     Chemistries  Recent Labs  Lab 12/17/20 2252  NA 127*  K 4.4  CL 97*  CO2 19*  GLUCOSE 105*  BUN 8  CREATININE 0.65  CALCIUM  8.7*  AST 49*  ALT 30  ALKPHOS 123  BILITOT 0.4   ------------------------------------------------------------------------------------------------------------------  ------------------------------------------------------------------------------------------------------------------ GFR: Estimated Creatinine Clearance: 70.3 mL/min (by C-G formula based on SCr of 0.65 mg/dL). Liver Function Tests: Recent Labs  Lab 12/17/20 2252  AST 49*  ALT 30  ALKPHOS 123  BILITOT 0.4  PROT 7.8  ALBUMIN 3.7   No results for input(s): LIPASE, AMYLASE in the last 168 hours. Recent Labs  Lab 12/17/20 2252  AMMONIA 62*   Coagulation Profile: No results for input(s): INR, PROTIME in the last 168 hours. Cardiac Enzymes: No results for input(s): CKTOTAL, CKMB, CKMBINDEX, TROPONINI in the last 168 hours. BNP (last 3 results) No results for input(s): PROBNP in the last 8760 hours. HbA1C: No results for input(s): HGBA1C in the last 72 hours. CBG: No results for input(s): GLUCAP in the last 168 hours. Lipid Profile: No results for input(s): CHOL, HDL, LDLCALC, TRIG, CHOLHDL, LDLDIRECT in the last 72 hours. Thyroid Function Tests: No results for input(s): TSH, T4TOTAL, FREET4, T3FREE, THYROIDAB in the last 72 hours. Anemia Panel: No results for input(s): VITAMINB12, FOLATE, FERRITIN, TIBC, IRON, RETICCTPCT in the last 72 hours.  --------------------------------------------------------------------------------------------------------------- Urine analysis:    Component Value Date/Time   COLORURINE YELLOW 04/02/2020 1049   APPEARANCEUR CLEAR 04/02/2020 1049   LABSPEC 1.003 (L) 04/02/2020 1049   PHURINE 5.0 04/02/2020 1049   GLUCOSEU NEGATIVE 04/02/2020 1049   HGBUR NEGATIVE 04/02/2020  [place]nez 04/02/2020 Ashley 04/02/2020 1049   PROTEINUR NEGATIVE 04/02/2020 1049   UROBILINOGEN 0.2 12/26/2014 1927   NITRITE NEGATIVE 04/02/2020 1049   LEUKOCYTESUR  NEGATIVE 04/02/2020 1049      Imaging Results:    CT Head Wo Contrast  Result Date: 12/18/2020 CLINICAL DATA:  Change in mental status prior stroke EXAM: CT HEAD WITHOUT CONTRAST TECHNIQUE: Contiguous axial images were obtained from the base of the skull through the vertex without intravenous contrast. COMPARISON:  April 02, 2020 FINDINGS: Brain: No evidence of acute territorial infarction, hemorrhage, hydrocephalus,extra-axial collection or mass lesion/mass effect. Again noted are stable areas of encephalomalacia involving the bifrontal lobes and left temporal lobe. Stable clip is seen within the inferior right temporal lobe. Vascular: No hyperdense vessel or unexpected calcification. Skull: Again noted is a prior right frontal craniotomy. No fracture or focal lesion identified. Sinuses/Orbits: The visualized paranasal sinuses and mastoid air cells are clear. The orbits and globes intact. Other: None IMPRESSION: No acute intracranial abnormality. Findings consistent with age related atrophy and chronic small vessel ischemia Stable areas of encephalomalacia Electronically Signed   By: Prudencio Pair M.D.   On: 12/18/2020 01:33   CT Abdomen Pelvis W Contrast  Result Date: 12/18/2020 CLINICAL DATA:  Right-sided epigastric abdominal pain EXAM: CT ABDOMEN AND PELVIS WITH CONTRAST TECHNIQUE: Multidetector CT imaging of the abdomen and pelvis was performed using the standard protocol following bolus administration of intravenous contrast. CONTRAST:  185mL OMNIPAQUE IOHEXOL 300 MG/ML  SOLN COMPARISON:  10/23/2019 FINDINGS: Lower chest: The visualized lung bases are clear. The visualized heart and pericardium are unremarkable. Small hiatal hernia. Hepatobiliary: The liver contour is nodular in keeping with cirrhosis. No focal intrahepatic mass identified. Gallbladder unremarkable. No intra or extrahepatic biliary ductal dilation. Pancreas: Unremarkable Spleen: Unremarkable Adrenals/Urinary Tract: Adrenal glands are  unremarkable. Kidneys are normal, without renal calculi, focal lesion, or hydronephrosis. Bladder is unremarkable. Stomach/Bowel: Severe descending and sigmoid colonic diverticulosis. The stomach, small bowel, and large bowel are otherwise unremarkable. Appendix normal. No free intraperitoneal gas or fluid. Vascular/Lymphatic: Mild atherosclerotic calcification within the abdominal aorta. No aortic aneurysm. Circumaortic left renal vein. No pathologic adenopathy within the abdomen and pelvis. Reproductive: Uterus absent. No adnexal masses. Fluid noted within the vaginal cavity. Other: No abdominal wall hernia.  Rectum unremarkable. Musculoskeletal: No acute bone abnormality. Degenerative changes are seen within the lumbar spine. Degenerative changes are noted within the hips bilaterally. No lytic or blastic bone lesions. IMPRESSION: No acute intra-abdominal pathology. No definite radiographic explanation for the patient's reported symptoms. Cirrhosis. No obvious stigmata of portal venous hypertension. No hepatic mass identified. Severe distal colonic diverticulosis. Aortic Atherosclerosis (ICD10-I70.0). Electronically Signed   By: Fidela Salisbury MD   On: 12/18/2020 00:19   DG Chest Port 1 View  Result Date: 12/17/2020 CLINICAL DATA:  Weakness, epigastric abdominal pain EXAM: PORTABLE CHEST 1 VIEW COMPARISON:  11/01/2019 FINDINGS: The heart size and mediastinal contours are within normal limits. Both lungs are clear. The visualized skeletal structures are unremarkable. IMPRESSION: No active disease. Electronically Signed   By: Randa Ngo M.D.   On: 12/17/2020 22:52    My personal review of EKG: Rhythm NSR, Rate 82 /min, QTc 434 ,no Acute ST changes   Assessment & Plan:    Active Problems:   Acute metabolic encephalopathy   1. Acute metabolic encephalopathy 1. Postictal state, elevated ammonia, alcohol intoxication likely all contributing 2. CT head is without acute intracranial  abnormality 3. Does  not report any infectious symptoms, UA pending, chest x-ray shows no active disease 4. Patient is hyponatremic with a sodium of 127, but she chronically has hyponatremia so this is not likely contributing 5. Check TSH, Tylenol level, salicylate level 6. If no improvement with current treatments consider MRI in the a.m. 2. Hyponatremia 1. Patient has been in this 127 range before, baseline seems to be around 134 2. Normal saline 75 mL's per hour 3. Recheck in a.m. 3. Alcohol abuse 1. Alcohol level is 260 2. With acute intoxication withdrawals are unlikely but CIWA protocol ordered as needed 4. Seizure disorder 1. Continue phenobarbital 2. Dilantin level was low, Dilantin per pharmacy 3. Seizure precautions 4. Continue to monitor 5. Abdominal pain 1. CT abdomen does not show acute pathology 2. Lactic acid ordered to evaluate for ischemia 3. Patient has nontender abdomen at the time of my exam -so ischemia is unlikely, patient asking for food 4. Diet ordered, Zofran if needed 6. Hypertension 1. Continue lisinopril 7. Thrombocytopenia 1. In the setting of alcohol abuse 2. Continue to trend   DVT Prophylaxis-Heparin- SCDs  AM Labs Ordered, also please review Full Orders  Family Communication: No family at bedside Code Status: Full  Admission status: Observation  Time spent in minutes :[place]

## 2020-12-18 NOTE — ED Notes (Signed)
EKG given and seen by Dr Wyvonnia Dusky

## 2020-12-18 NOTE — Progress Notes (Signed)
MD notified of critical lactic acid of 2.0. No new orders obtained.

## 2020-12-18 NOTE — Plan of Care (Signed)

## 2020-12-18 NOTE — Progress Notes (Signed)
MEDICATION RELATED CONSULT NOTE - INITIAL   Pharmacy Consult for Phenytoin Indication: h/o seizures  No Known Allergies  Patient Measurements: Height: 5\' 4"  (162.6 cm) Weight: 72.6 kg (160 lb) IBW/kg (Calculated) : 54.7 Adjusted Body Weight: 68 kg  Vital Signs: Temp: 96.8 F (36 C) (03/02 2151) Temp Source: Axillary (03/02 2151) BP: 130/70 (03/02 2318) Pulse Rate: 83 (03/02 2318) Intake/Output from previous day: 03/02 0701 - 03/03 0700 In: -  Out: 300 [Urine:300] Intake/Output from this shift: Total I/O In: -  Out: 300 [Urine:300]  Labs: Recent Labs    12/17/20 2252  WBC 3.3*  HGB 10.4*  HCT 31.7*  PLT 145*  CREATININE 0.65  ALBUMIN 3.7  PROT 7.8  AST 49*  ALT 30  ALKPHOS 123  BILITOT 0.4   Estimated Creatinine Clearance: 70.3 mL/min (by C-G formula based on SCr of 0.65 mg/dL).   Microbiology: Recent Results (from the past 720 hour(s))  Resp Panel by RT-PCR (Flu A&B, Covid) Nasopharyngeal Swab     Status: None   Collection Time: 12/17/20 11:07 PM   Specimen: Nasopharyngeal Swab; Nasopharyngeal(NP) swabs in vial transport medium  Result Value Ref Range Status   SARS Coronavirus 2 by RT PCR NEGATIVE NEGATIVE Final    Comment: (NOTE) SARS-CoV-2 target nucleic acids are NOT DETECTED.  The SARS-CoV-2 RNA is generally detectable in upper respiratory specimens during the acute phase of infection. The lowest concentration of SARS-CoV-2 viral copies this assay can detect is 138 copies/mL. A negative result does not preclude SARS-Cov-2 infection and should not be used as the sole basis for treatment or other patient management decisions. A negative result may occur with  improper specimen collection/handling, submission of specimen other than nasopharyngeal swab, presence of viral mutation(s) within the areas targeted by this assay, and inadequate number of viral copies(<138 copies/mL). A negative result must be combined with clinical observations, patient  history, and epidemiological information. The expected result is Negative.  Fact Sheet for Patients:  EntrepreneurPulse.com.au  Fact Sheet for Healthcare Providers:  IncredibleEmployment.be  This test is no t yet approved or cleared by the [place] FDA and  has been authorized for detection and/or diagnosis of SARS-CoV-2 by FDA under an Emergency Use Authorization (EUA). This EUA will remain  in effect (meaning this test can be used) for the duration of the COVID-19 declaration under Section 564(b)(1) of the Act, 21 U.S.C.section 360bbb-3(b)(1), unless the authorization is terminated  or revoked sooner.       Influenza A by PCR NEGATIVE NEGATIVE Final   Influenza B by PCR NEGATIVE NEGATIVE Final    Comment: (NOTE) The Xpert Xpress SARS-CoV-2/FLU/RSV plus assay is intended as an aid in the diagnosis of influenza from Nasopharyngeal swab specimens and should not be used as a sole basis for treatment. Nasal washings and aspirates are unacceptable for Xpert Xpress SARS-CoV-2/FLU/RSV testing.  Fact Sheet for Patients: EntrepreneurPulse.com.au  Fact Sheet for Healthcare Providers: IncredibleEmployment.be  This test is not yet approved or cleared by the [place] FDA and has been authorized for detection and/or diagnosis of SARS-CoV-2 by FDA under an Emergency Use Authorization (EUA). This EUA will remain in effect (meaning this test can be used) for the duration of the COVID-19 declaration under Section 564(b)(1) of the Act, 21 U.S.C. section 360bbb-3(b)(1), unless the authorization is terminated or revoked.  Performed at [facility], [address], [place], [place] [address]     Medical History: Past Medical History:  Diagnosis Date  . Alcohol abuse   .  Asthma   . Contracture of muscle of hand    right  . GERD (gastroesophageal reflux disease)   . HTN (hypertension)   . PUD (peptic ulcer  disease)    remote  . Seizures ([place])    since stroke, no recent seizures  . Stroke [facility])    age 53, required brain surgery effected right side    Medications:  Awaiting electronic med rec  Assessment: 65 y.o. F presents with ETOH intoxication and possible seizure-like activity. Pt on phenytoin 100mg  TID PTA and phenobarbital 32.4mg  BID. Albumin wnl.  Phenytoin level 5 (subtherapeutic) on admission  Goal of Therapy:  Phenytoin level 10-20 mcg/ml  Plan:  Phenytoin 750mg  IV now then 125mg  IV TID Will f/u phenytoin levels at [facility] (5-7 days)  Sherlon Handing, PharmD, BCPS Please see amion for complete clinical pharmacist phone list 12/18/2020,1:14 AM

## 2020-12-19 DIAGNOSIS — E872 Acidosis, unspecified: Secondary | ICD-10-CM

## 2020-12-19 DIAGNOSIS — G9341 Metabolic encephalopathy: Secondary | ICD-10-CM | POA: Diagnosis not present

## 2020-12-19 LAB — BASIC METABOLIC PANEL
Anion gap: 9 (ref 5–15)
BUN: 11 mg/dL (ref 8–23)
CO2: 23 mmol/L (ref 22–32)
Calcium: 8.7 mg/dL — ABNORMAL LOW (ref 8.9–10.3)
Chloride: 105 mmol/L (ref 98–111)
Creatinine, Ser: 0.72 mg/dL (ref 0.44–1.00)
GFR, Estimated: >60 mL/min (ref >60)
Glucose, Bld: 106 mg/dL — ABNORMAL HIGH (ref 70–99)
Potassium: 3.9 mmol/L (ref 3.5–5.1)
Sodium: 137 mmol/L (ref 135–145)

## 2020-12-19 LAB — CBC
HCT: 27.6 % — ABNORMAL LOW (ref 36.0–46.0)
Hemoglobin: 9.1 g/dL — ABNORMAL LOW (ref 12.0–15.0)
MCH: 28.2 pg (ref 26.0–34.0)
MCHC: 33 g/dL (ref 30.0–36.0)
MCV: 85.4 fL (ref 80.0–100.0)
Platelets: 122 10*3/uL — ABNORMAL LOW (ref 150–400)
RBC: 3.23 MIL/uL — ABNORMAL LOW (ref 3.87–5.11)
RDW: 17.3 % — ABNORMAL HIGH (ref 11.5–15.5)
WBC: 3.3 10*3/uL — ABNORMAL LOW (ref 4.0–10.5)
nRBC: 0 % (ref 0.0–0.2)

## 2020-12-19 LAB — GLUCOSE, CAPILLARY: Glucose-Capillary: 109 mg/dL — ABNORMAL HIGH (ref 70–99)

## 2020-12-19 MED ORDER — PHENYTOIN SODIUM EXTENDED 100 MG PO CAPS
100.0000 mg | ORAL_CAPSULE | Freq: Three times a day (TID) | ORAL | Status: DC
  Administered 2020-12-19: 100 mg via ORAL
  Filled 2020-12-19: qty 1

## 2020-12-19 NOTE — Plan of Care (Signed)

## 2020-12-19 NOTE — Discharge Summary (Signed)
Physician Discharge Summary [facility]    Patient: [name]                   Admit date: 12/17/2020   DOB: 03/26/1956             Discharge date:12/19/2020/11:37 AM NFA:[id number]                          PCP: [name], MD  Disposition: HOME with Home Health   Recommendations for Outpatient Follow-up:   . Follow up: in 2 weeks  Discharge Condition: Stable   Code Status:   Code Status: Full Code  Diet recommendation: Regular healthy diet   Discharge Diagnoses:    Active Problems:   Acute metabolic encephalopathy   Encephalopathy acute   History of Present Illness/ Hospital Course [name] Summary:   [name],with medical history significant for stroke-residual right-sided weakness and dysarthria, seizures, peptic ulcer disease, hypertension, GERD, alcohol abuse presents to the ED with a chief complaint of confusion. Patient reports that she does not know why she is here. ED provider was able to speak with caretaker who reported that she had a seizure today, she was weak and confused afterwards with decreased responsiveness, so he wanted her to come into the ED. Patient came via EMS. Patient has no recollection of having a seizure. She does report that she had lost time from today. She reports that at home her main concern for the past few days-patient is unsure how many days exactly-has been abdominal pain. She reports periumbilical abdominal pain that feels like cramping, burning, twisting, and sharp. She reports associated nausea and throwing up. She reports she has been throwing up every time she eats. Her emesis is green and yellow, but nonbloody. She reports that she is also having a bowel movement every time she eats. They are not loose stools though per her report. No hematochezia no melena. Patient reports that she cannot remember when her last normal meal was. She reports that she is having chest pain, from a rib,  and reports that it is chronic. Patient has no other complaints at this time.  Patient had an alcohol level today of 260. She reports that she drinks "a few times a week," and its 2 beers each time. When asked, she drank today before she came to the hospital she was mumbling under her breath trying to count, and ended up reporting that she had a sixpack today. I think this number is probably low. Patient does not smoke or do illicit drugs per her report.  Patient reports that she has been taking her seizure medication. Her seizures do not have an aura, says she has no way of telling when she is going to have one or when she did have 1.  Patient reports that she did get vaccinated for COVID. Unclear if she had 2 or 3 vaccines. Patient is full code.   In the ED Temperature 96.8, heart rate 81, respiratory 18, blood pressure 130/70, satting 100% Negative COVID UDS positive for barbiturates as expected from her medications CT head showed no acute intra-abdominal pathology. No definite radiographic explanation for the patient's reported symptoms. Cirrhosis is mention, but no obvious stigmata of portal venous hypertension. No hepatic mass. Severe distal colonic diverticulosis. CT head shows no acute intracranial abnormality. Findings consistent with age-related atrophy and chronic small vessel ischemia. Stable areas of encephalomalacia  Acute metabolic encephalopathy 1. Possible seizures, postictal  state, elevated ammonia, alcohol intoxication likely all contributing 2. Mental status is improved 3. CT head is without acute intracranial abnormality 4. Does not report any infectious symptoms, UA pending, chest x-ray shows no active disease 5. Patient is hyponatremic with a sodium of 127, but she chronically has hyponatremia so this is not likely contributing >>> sodium is improved to one thirty-five 6. TSH, Tylenol level, salicylate level >> within normal limits  Metabolic  acidosis - lactic acidosis  -No signs of sepsis, infection -Improved lactic acidosis -Responded well to IV fluid resuscitation, lactic acid has improved  hyponatremia 1. Sodium on admission 127  >>>>  135, 137 2. Baseline seems to be around 134 3. Status post IV fluid resuscitation including normal saline 4.   Alcohol abuse 1. Alcohol level is 260 2. With acute intoxication withdrawals are unlikely butCIWAprotocol ordered as needed 3. Mentation back to baseline, no further seizures or withdrawal symptoms  Seizure disorder 1. Continue phenobarbital 2. Dilantin level --drawn on admission was low 3. Seizure precautions 4. No further seizure episodes overnight  Abdominal pain 1. CT abdomen reviewed no acute findings 2. Improved abdominal pain 3. No signs of ischemia-benign abdominal exam this a.m. 4. Diet ordered, Zofran if needed  Hypertension 1. Continue lisinopril  Thrombocytopenia 1. In the setting of alcohol abuse 2. Monitoring closely,  History of CVA -Right-sided weakness, right upper extremity contracture -Continue to monitor  -Not on aspirin at home, will initiate  ------------------------------------------------------------------------------------------------------------------------------- Nutritional status:  The patient's BMI is: Body mass index is 27.46 kg/m. I agree with the assessment and plan as outlined ...  -------------------------------------------------------------------------------------------------------------------------------  Code Status:   Code Status: Full Code  Family Communication: No family member present at bedside - attempt will be made to update daily The above findings and plan of care has been discussed with patient in detail,  expressed understanding and agreement of above. -Advance care planning has been discussed.   Admission status:  Status is: Inpatient    Dispo: The patient is from: Home   Anticipated d/c is to: Home with [place] in 1     Discharge Instructions:   Discharge Instructions    Activity as tolerated - No restrictions   Complete by: As directed    Diet - low sodium heart healthy   Complete by: As directed    Increase activity slowly   Complete by: As directed        Medication List    TAKE these medications   acetaminophen 325 MG tablet Commonly known as: TYLENOL Take 325-650 mg by mouth every 6 (six) hours as needed for moderate pain.   albuterol 108 (90 Base) MCG/ACT inhaler Commonly known as: VENTOLIN HFA Inhale 2 puffs into the lungs every 6 (six) hours as needed for wheezing or shortness of breath.   Ensure Plus Liqd Take 237 mLs by mouth 2 (two) times daily between meals.   folic acid 102 MCG tablet Commonly known as: FOLVITE Take 400 mcg by mouth daily.   HYDROcodone-acetaminophen 5-325 MG tablet Commonly known as: NORCO/VICODIN One tablet every six hours for pain.  Limit 7 days.   LEG CRAMP RELIEF PO Take 1 tablet by mouth 3 (three) times daily as needed (leg cramps).   linaclotide 290 MCG Caps capsule Commonly known as: Linzess Take 1 capsule (290 mcg total) by mouth daily before breakfast.   lisinopril 40 MG tablet Commonly known as: ZESTRIL Take 1 tablet (40 mg total) by mouth daily.   multivitamin with minerals Tabs tablet  Take 1 tablet by mouth daily.   pantoprazole 40 MG tablet Commonly known as: Protonix Take 1 tablet (40 mg total) by mouth 2 (two) times daily before a meal.   PHENobarbital 32.4 MG tablet Commonly known as: LUMINAL Take 32.4 mg by mouth 2 (two) times daily.   phenytoin 100 MG ER capsule Commonly known as: DILANTIN Take 100 mg by mouth 3 (three) times daily.   potassium chloride SA 20 MEQ tablet Commonly known as: KLOR-CON Take 20 mEq by mouth 3 (three) times daily.   solifenacin 5 MG tablet Commonly known as: VESICARE Take 5 mg by mouth daily.       No Known Allergies   Procedures  /Studies:   CT Head Wo Contrast  Result Date: 12/18/2020 CLINICAL DATA:  Change in mental status prior stroke EXAM: CT HEAD WITHOUT CONTRAST TECHNIQUE: Contiguous axial images were obtained from the base of the skull through the vertex without intravenous contrast. COMPARISON:  April 02, 2020 FINDINGS: Brain: No evidence of acute territorial infarction, hemorrhage, hydrocephalus,extra-axial collection or mass lesion/mass effect. Again noted are stable areas of encephalomalacia involving the bifrontal lobes and left temporal lobe. Stable clip is seen within the inferior right temporal lobe. Vascular: No hyperdense vessel or unexpected calcification. Skull: Again noted is a prior right frontal craniotomy. No fracture or focal lesion identified. Sinuses/Orbits: The visualized paranasal sinuses and mastoid air cells are clear. The orbits and globes intact. Other: None IMPRESSION: No acute intracranial abnormality. Findings consistent with age related atrophy and chronic small vessel ischemia Stable areas of encephalomalacia Electronically Signed   By: Prudencio Pair M.D.   On: 12/18/2020 01:33   CT Abdomen Pelvis W Contrast  Result Date: 12/18/2020 CLINICAL DATA:  Right-sided epigastric abdominal pain EXAM: CT ABDOMEN AND PELVIS WITH CONTRAST TECHNIQUE: Multidetector CT imaging of the abdomen and pelvis was performed using the standard protocol following bolus administration of intravenous contrast. CONTRAST:  134mL OMNIPAQUE IOHEXOL 300 MG/ML  SOLN COMPARISON:  10/23/2019 FINDINGS: Lower chest: The visualized lung bases are clear. The visualized heart and pericardium are unremarkable. Small hiatal hernia. Hepatobiliary: The liver contour is nodular in keeping with cirrhosis. No focal intrahepatic mass identified. Gallbladder unremarkable. No intra or extrahepatic biliary ductal dilation. Pancreas: Unremarkable Spleen: Unremarkable Adrenals/Urinary Tract: Adrenal glands are unremarkable. Kidneys are normal, without  renal calculi, focal lesion, or hydronephrosis. Bladder is unremarkable. Stomach/Bowel: Severe descending and sigmoid colonic diverticulosis. The stomach, small bowel, and large bowel are otherwise unremarkable. Appendix normal. No free intraperitoneal gas or fluid. Vascular/Lymphatic: Mild atherosclerotic calcification within the abdominal aorta. No aortic aneurysm. Circumaortic left renal vein. No pathologic adenopathy within the abdomen and pelvis. Reproductive: Uterus absent. No adnexal masses. Fluid noted within the vaginal cavity. Other: No abdominal wall hernia.  Rectum unremarkable. Musculoskeletal: No acute bone abnormality. Degenerative changes are seen within the lumbar spine. Degenerative changes are noted within the hips bilaterally. No lytic or blastic bone lesions. IMPRESSION: No acute intra-abdominal pathology. No definite radiographic explanation for the patient's reported symptoms. Cirrhosis. No obvious stigmata of portal venous hypertension. No hepatic mass identified. Severe distal colonic diverticulosis. Aortic Atherosclerosis (ICD10-I70.0). Electronically Signed   By: Fidela Salisbury MD   On: 12/18/2020 00:19   DG Chest Port 1 View  Result Date: 12/17/2020 CLINICAL DATA:  Weakness, epigastric abdominal pain EXAM: PORTABLE CHEST 1 VIEW COMPARISON:  11/01/2019 FINDINGS: The heart size and mediastinal contours are within normal limits. Both lungs are clear. The visualized skeletal structures are unremarkable. IMPRESSION: No active disease.  Electronically Signed   By: Randa Ngo M.D.   On: 12/17/2020 22:52     Subjective:   Patient was seen and examined 12/19/2020, 11:37 AM Patient stable today. No acute distress.  No issues overnight Stable for discharge.  Discharge Exam:    Vitals:   12/18/20 1132 12/18/20 1404 12/18/20 2055 12/19/20 0513  BP:  (!) 144/58 138/64 (!) 117/53  Pulse: 94 90 100 91  Resp:  16 18 16   Temp:  99.5 F (37.5 C) 99.2 F (37.3 C) 99.5 F (37.5 C)   TempSrc:   Oral Oral  SpO2: 99% 100% 100% 97%  Weight:      Height:        General: Pt lying comfortably in bed & appears in no obvious distress. Cardiovascular: S1 & S2 heard, RRR, S1/S2 +. No murmurs, rubs, gallops or clicks. No JVD or pedal edema. Respiratory: Clear to auscultation without wheezing, rhonchi or crackles. No increased work of breathing. Abdominal:  Non-distended, non-tender & soft. No organomegaly or masses appreciated. Normal bowel sounds heard. CNS: Alert and oriented. No focal deficits. Extremities: Right-sided weakness dense, with right upper extremity contracted, otherwise no edema, no cyanosis      The results of significant diagnostics from this hospitalization (including imaging, microbiology, ancillary and laboratory) are listed below for reference.      Microbiology:   Recent Results (from the past 240 hour(s))  Resp Panel by RT-PCR (Flu A&B, Covid) Nasopharyngeal Swab     Status: None   Collection Time: 12/17/20 11:07 PM   Specimen: Nasopharyngeal Swab; Nasopharyngeal(NP) swabs in vial transport medium  Result Value Ref Range Status   SARS Coronavirus 2 by RT PCR NEGATIVE NEGATIVE Final    Comment: (NOTE) SARS-CoV-2 target nucleic acids are NOT DETECTED.  The SARS-CoV-2 RNA is generally detectable in upper respiratory specimens during the acute phase of infection. The lowest concentration of SARS-CoV-2 viral copies this assay can detect is 138 copies/mL. A negative result does not preclude SARS-Cov-2 infection and should not be used as the sole basis for treatment or other patient management decisions. A negative result may occur with  improper specimen collection/handling, submission of specimen other than nasopharyngeal swab, presence of viral mutation(s) within the areas targeted by this assay, and inadequate number of viral copies(<138 copies/mL). A negative result must be combined with clinical observations, patient history, and  epidemiological information. The expected result is Negative.  Fact Sheet for Patients:  EntrepreneurPulse.com.au  Fact Sheet for Healthcare Providers:  IncredibleEmployment.be  This test is no t yet approved or cleared by the [place] FDA and  has been authorized for detection and/or diagnosis of SARS-CoV-2 by FDA under an Emergency Use Authorization (EUA). This EUA will remain  in effect (meaning this test can be used) for the duration of the COVID-19 declaration under Section 564(b)(1) of the Act, 21 U.S.C.section 360bbb-3(b)(1), unless the authorization is terminated  or revoked sooner.       Influenza A by PCR NEGATIVE NEGATIVE Final   Influenza B by PCR NEGATIVE NEGATIVE Final    Comment: (NOTE) The Xpert Xpress SARS-CoV-2/FLU/RSV plus assay is intended as an aid in the diagnosis of influenza from Nasopharyngeal swab specimens and should not be used as a sole basis for treatment. Nasal washings and aspirates are unacceptable for Xpert Xpress SARS-CoV-2/FLU/RSV testing.  Fact Sheet for Patients: EntrepreneurPulse.com.au  Fact Sheet for Healthcare Providers: IncredibleEmployment.be  This test is not yet approved or cleared by the [place] FDA and has  been authorized for detection and/or diagnosis of SARS-CoV-2 by FDA under an Emergency Use Authorization (EUA). This EUA will remain in effect (meaning this test can be used) for the duration of the COVID-19 declaration under Section 564(b)(1) of the Act, 21 U.S.C. section 360bbb-3(b)(1), unless the authorization is terminated or revoked.  Performed at [facility], [address] [place], [place] [address]      Labs:   CBC: Recent Labs  Lab 12/17/20 2252 12/18/20 0420 12/19/20 0508  WBC 3.3* 2.7* 3.3*  NEUTROABS 1.5*  --   --   HGB 10.4* 10.1* 9.1*  HCT 31.7* 30.8* 27.6*  MCV 84.8 84.4 85.4  PLT 145* 159 426*   Basic Metabolic  Panel: Recent Labs  Lab 12/17/20 2252 12/18/20 0420 12/19/20 0508  NA 127* 135 137  K 4.4 4.6 3.9  CL 97* 102 105  CO2 19* 21* 23  GLUCOSE 105* 97 106*  BUN 8 9 11   CREATININE 0.65 0.61 0.72  CALCIUM 8.7* 8.9 8.7*  MG  --  1.9  --   PHOS  --  4.0  --    Liver Function Tests: Recent Labs  Lab 12/17/20 2252 12/18/20 0420  AST 49* 45*  ALT 30 30  ALKPHOS 123 120  BILITOT 0.4 0.6  PROT 7.8 7.8  ALBUMIN 3.7 3.7   BNP (last 3 results) No results for input(s): BNP in the last 8760 hours. Cardiac Enzymes: No results for input(s): CKTOTAL, CKMB, CKMBINDEX, TROPONINI in the last 168 hours. CBG: Recent Labs  Lab 12/19/20 0743  GLUCAP 109*   Hgb A1c No results for input(s): HGBA1C in the last 72 hours. Lipid Profile No results for input(s): CHOL, HDL, LDLCALC, TRIG, CHOLHDL, LDLDIRECT in the last 72 hours. Thyroid function studies Recent Labs    12/18/20 0420  TSH 1.214   Anemia work up Recent Labs    12/18/20 0751  VITAMINB12 395  FOLATE 6.9   Urinalysis    Component Value Date/Time   COLORURINE STRAW (A) 12/17/2020 2310   APPEARANCEUR CLEAR 12/17/2020 2310   LABSPEC 1.003 (L) 12/17/2020 2310   PHURINE 6.0 12/17/2020 2310   GLUCOSEU NEGATIVE 12/17/2020 2310   HGBUR SMALL (A) 12/17/2020 2310   BILIRUBINUR NEGATIVE 12/17/2020 2310   KETONESUR NEGATIVE 12/17/2020 2310   PROTEINUR NEGATIVE 12/17/2020 2310   UROBILINOGEN 0.2 12/26/2014 1927   NITRITE NEGATIVE 12/17/2020 2310   LEUKOCYTESUR TRACE (A) 12/17/2020 2310         Time coordinating discharge: Over 45 minutes  SIGNED: Deatra James, MD, FACP, FHM. [facility],  Please use [email or web address] to Page If 7PM-7AM, please contact night-coverage [email or web address] [email or web address] [facility] 12/19/2020, 11:37 AM

## 2020-12-19 NOTE — Evaluation (Addendum)
Physical Therapy Evaluation Patient Details Name: [name] MRN: [id number] DOB: October 16, 1956 Today's Date: 12/19/2020   History of Present Illness  [name]  is a 65 y.o. female, with medical history significant for stroke-residual right-sided weakness and dysarthria, seizures, peptic ulcer disease, hypertension, GERD, alcohol abuse presents to the ED with a chief complaint of confusion.  Patient reports that she does not know why she is here.  ED provider was able to speak with caretaker who reported that she had a seizure today, she was weak and confused afterwards with decreased responsiveness, so he wanted her to come into the ED.  Patient came via EMS.  Patient has no recollection of having a seizure.  She does report that she had lost time from today.  She reports that at home her main concern for the past few days-patient is unsure how many days exactly-has been abdominal pain.  She reports periumbilical abdominal pain that feels like cramping, burning, twisting, and sharp.  She reports associated nausea and throwing up.  She reports she has been throwing up every time she eats.  Her emesis is green and yellow, but nonbloody.  She reports that she is also having a bowel movement every time she eats.  They are not loose stools though per her report.  No hematochezia no melena.  Patient reports that she cannot remember when her last normal meal was.  She reports that she is having chest pain, from a rib, and reports that it is chronic.  Patient has no other complaints at this time.    Clinical Impression  Patient functioning near baseline for functional mobility and gait demonstrating good return for ambulation in room and hallway using quad-cane without loss of balance.  Patient can ambulate very short distances without AD, but has to lean on nearby objects for support after 5-6 steps and encouraged to use her quad-cane at home.  Patient tolerated sitting up in chair after therapy with her  boyfriend present in room - nursing staff notified.  Plan: patient to be discharged to home to day and discharged from physical therapy to care of nursing for ambulation daily as tolerated for length of stay.     Follow Up Recommendations Home health PT;Supervision for mobility/OOB;Supervision - Intermittent    Equipment Recommendations  None recommended by PT    Recommendations for Other Services       Precautions / Restrictions Precautions Precautions: Fall Restrictions Weight Bearing Restrictions: No      Mobility  Bed Mobility Overal bed mobility: Needs Assistance Bed Mobility: Supine to Sit     Supine to sit: Supervision     General bed mobility comments: increased time, labored movement, had to use bed rail    Transfers Overall transfer level: Needs assistance Equipment used: Quad cane;None Transfers: Sit to/from [employer] to Stand: Min guard Stand pivot transfers: Supervision;Min guard       General transfer comment: requires repeated attempts to complete sit to stands due to weakness  Ambulation/Gait Ambulation/Gait assistance: Supervision Gait Distance (Feet): 65 Feet Assistive device: Quad cane Gait Pattern/deviations: Decreased step length - right;Decreased step length - left;Decreased stride length;Step-to pattern Gait velocity: decreased   General Gait Details: slow slightly labored cadence with mostly 3 point gait pattern using Quad-cane, no loss of balance, limited mostly due to fatigue  Stairs            Wheelchair Mobility    Modified Rankin (Stroke Patients Only)  Balance Overall balance assessment: Needs assistance Sitting-balance support: Feet supported;No upper extremity supported Sitting balance-Leahy Scale: Good Sitting balance - Comments: seated at EOB   Standing balance support: During functional activity;Single extremity supported Standing balance-Leahy Scale: Fair Standing balance comment:  fair/good using quad-cane, fair without AD                             Pertinent Vitals/Pain Pain Assessment: No/denies pain    Home Living Family/patient expects to be discharged to:: Private residence Living Arrangements: Other relatives;Other (Comment) (boyfriend) Available Help at Discharge: Friend(s);Personal care attendant;Available 24 hours/day Type of Home: Mobile home Home Access: Ramped entrance     Home Layout: One level Home Equipment: [place] - 2 wheels;Cane - quad;Bedside commode;Wheelchair - manual;Hospital bed;Shower seat - built in;Grab bars - tub/shower      Prior Function Level of Independence: Needs assistance   Gait / Transfers Assistance Needed: Household ambulator with Quad-cane  ADL's / Homemaking Assistance Needed: home aides 8 hours/day x 5 days/week, and boyfriend assist        Hand Dominance   Dominant Hand: Left    Extremity/Trunk Assessment   Upper Extremity Assessment Upper Extremity Assessment: Defer to OT evaluation    Lower Extremity Assessment Lower Extremity Assessment: Overall WFL for tasks assessed    Cervical / Trunk Assessment Cervical / Trunk Assessment: Normal  Communication   Communication: Expressive difficulties  Cognition Arousal/Alertness: Awake/alert Behavior During Therapy: WFL for tasks assessed/performed Overall Cognitive Status: Within Functional Limits for tasks assessed                                        General Comments      Exercises     Assessment/Plan    PT Assessment All further PT needs can be met in the next venue of care  PT Problem List Decreased strength;Decreased activity tolerance;Decreased balance;Decreased mobility       PT Treatment Interventions      PT Goals (Current goals can be found in the Care Plan section)  Acute Rehab PT Goals Patient Stated Goal: return home with boyfriend, home aides to assist PT Goal Formulation: With patient/family Time  For Goal Achievement: 12/19/20 Potential to Achieve Goals: Good    Frequency     Barriers to discharge        Co-evaluation               AM-PAC PT "6 Clicks" Mobility  Outcome Measure Help needed turning from your back to your side while in a flat bed without using bedrails?: None Help needed moving from lying on your back to sitting on the side of a flat bed without using bedrails?: A Little Help needed moving to and from a bed to a chair (including a wheelchair)?: A Little Help needed standing up from a chair using your arms (e.g., wheelchair or bedside chair)?: A Little Help needed to walk in hospital room?: A Little Help needed climbing 3-5 steps with a railing? : A Lot 6 Click Score: 18    End of Session   Activity Tolerance: Patient tolerated treatment well;Patient limited by fatigue Patient left: in chair;with call bell/phone within reach;with family/visitor present Nurse Communication: Mobility status PT Visit Diagnosis: Unsteadiness on feet (R26.81);Other abnormalities of gait and mobility (R26.89);Muscle weakness (generalized) (M62.81)    Time: 4098-1191 PT Time Calculation (  min) (ACUTE ONLY): 27 min   Charges:   PT Evaluation $PT Eval Moderate Complexity: 1 Mod PT Treatments $Therapeutic Activity: 23-37 mins        12:26 PM, 12/19/20 Lonell Grandchild, MPT Physical Therapist with [facility] [phone] office [phone] mobile phone

## 2020-12-30 ENCOUNTER — Other Ambulatory Visit: Payer: Self-pay

## 2020-12-30 ENCOUNTER — Ambulatory Visit (INDEPENDENT_AMBULATORY_CARE_PROVIDER_SITE_OTHER): Payer: Medicare Other | Admitting: Orthopaedic Surgery

## 2020-12-30 ENCOUNTER — Encounter: Payer: Self-pay | Admitting: Orthopaedic Surgery

## 2020-12-30 DIAGNOSIS — G8929 Other chronic pain: Secondary | ICD-10-CM

## 2020-12-30 DIAGNOSIS — R531 Weakness: Secondary | ICD-10-CM

## 2020-12-30 DIAGNOSIS — M25562 Pain in left knee: Secondary | ICD-10-CM

## 2020-12-30 MED ORDER — HYDROCODONE-ACETAMINOPHEN 5-325 MG PO TABS: ORAL_TABLET | ORAL | 0 refills | Status: DC

## 2020-12-30 NOTE — Progress Notes (Signed)
PROCEDURE NOTE:  The patient requests injections of the left knee , verbal consent was obtained.  The left knee was prepped appropriately after time out was performed.   Sterile technique was observed and injection of 1 cc of Celestone 6 mg with several cc's of plain xylocaine. Anesthesia was provided by ethyl chloride and a 20-gauge needle was used to inject the knee area. The injection was tolerated well.  A band aid dressing was applied.  The patient was advised to apply ice later today and tomorrow to the injection sight as needed.  I have reviewed the [place] web site prior to prescribing narcotic medicine for this patient.   Return in six weeks.  Electronically Signed Sanjuana Kava, MD 3/15/20229:56 AM

## 2021-01-07 ENCOUNTER — Encounter: Payer: Self-pay | Admitting: Gastroenterology

## 2021-01-07 ENCOUNTER — Encounter: Payer: Self-pay | Admitting: *Deleted

## 2021-01-07 ENCOUNTER — Other Ambulatory Visit: Payer: Self-pay

## 2021-01-07 ENCOUNTER — Ambulatory Visit (INDEPENDENT_AMBULATORY_CARE_PROVIDER_SITE_OTHER): Payer: Medicare Other | Admitting: Gastroenterology

## 2021-01-07 VITALS — BP 126/67 | HR 97 | Temp 97.1°F | Ht 64.0 in | Wt 156.6 lb

## 2021-01-07 DIAGNOSIS — D649 Anemia, unspecified: Secondary | ICD-10-CM

## 2021-01-07 DIAGNOSIS — R1084 Generalized abdominal pain: Secondary | ICD-10-CM | POA: Diagnosis not present

## 2021-01-07 DIAGNOSIS — K7030 Alcoholic cirrhosis of liver without ascites: Secondary | ICD-10-CM | POA: Diagnosis not present

## 2021-01-07 DIAGNOSIS — K219 Gastro-esophageal reflux disease without esophagitis: Secondary | ICD-10-CM | POA: Diagnosis not present

## 2021-01-07 DIAGNOSIS — K625 Hemorrhage of anus and rectum: Secondary | ICD-10-CM

## 2021-01-07 NOTE — Patient Instructions (Signed)
1. Continue pantoprazole 40 mg twice daily before a meal. 2. Continue Linzess 290 mcg daily to maintain regular bowel movements.  If you go more than 24 hours without a bowel movement, I suggest you take a dose of Linzess. 3. Colonoscopy as scheduled.  Please see separate instructions.

## 2021-01-07 NOTE — Progress Notes (Signed)
Primary Care Physician: [name], MD  Primary Gastroenterologist:  Elon Alas. Abbey Chatters, DO   Chief Complaint  Patient presents with  . Abdominal Pain    Mid abd. Recent admission    HPI: [name] is a 65 y.o. female here to reschedule colonoscopy.  She was last seen in December.  Colonoscopy initially scheduled for rectal bleeding.  First procedure canceled due to her eating after taking a bowel prep.  Second procedure canceled because she did not show up for her preop.  Patient presents with her caregiver today.  She also has a history of reflux esophagitis, anemia, compensated alcoholic cirrhosis, gallbladder polyp.  Patient was in the hospital earlier this month, presented with confusion.  Reportedly had a seizure prior to presentation.  Complains of nausea and vomiting.  Presentation alcohol level was 260.  CT head showed no acute intracranial abnormality.  Stable areas of encephalomalacia, age-related atrophy and chronic small vessel ischemia.  CT abdomen and pelvis with contrast showed cirrhosis, unremarkable gallbladder, severe distal colonic diverticulosis.  Labs showed stable anemia with hemoglobin in the 9-10 range.  Stable mild thrombocytopenia with platelets 122,000.  Folate level normal at 6.9, B12 395, ammonia level in the 60-70 range, LFTs unremarkable except for mildly elevated AST.  Last colonoscopy December 2013 with moderate diverticulosis, tubular adenoma removed.  Random colon biopsies negative.  EGD March 2019 with grade D esophagitis, medium sized hiatal hernia.  Today: Patient reports her appetite is good.  Denies heartburn.  She has some central abdominal pain, worse with meals.  Typically followed by bowel movement with some noted improvement in pain.  She takes Linzess daily.  Generally has about 3 bowel movements per day.  No blood in the stool or melena.  Denies dysphagia.  She reports drinking approximately 24 ounces of beer daily.  Caregiver  states she does not drink while she is working with her from 8-4 but after she leaves she suspects that she does drink at that point.    Current Outpatient Medications  Medication Sig Dispense Refill  . acetaminophen (TYLENOL) 325 MG tablet Take 325-650 mg by mouth every 6 (six) hours as needed for moderate pain.     Marland Kitchen albuterol (PROVENTIL HFA;VENTOLIN HFA) 108 (90 BASE) MCG/ACT inhaler Inhale 2 puffs into the lungs every 6 (six) hours as needed for wheezing or shortness of breath.     . ENSURE PLUS (ENSURE PLUS) LIQD Take 237 mLs by mouth 2 (two) times daily between meals.     . folic acid (FOLVITE) 400 MCG tablet Take 400 mcg by mouth daily.    . Homeopathic Products (LEG CRAMP RELIEF PO) Take 1 tablet by mouth 3 (three) times daily as needed (leg cramps).    Marland Kitchen HYDROcodone-acetaminophen (NORCO/VICODIN) 5-325 MG tablet One tablet every six hours for pain.  Limit 7 days. 28 tablet 0  . linaclotide (LINZESS) 290 MCG CAPS capsule Take 1 capsule (290 mcg total) by mouth daily before breakfast. 30 capsule 5  . lisinopril (ZESTRIL) 40 MG tablet Take 1 tablet (40 mg total) by mouth daily. 30 tablet 2  . Multiple Vitamin (MULTIVITAMIN WITH MINERALS) TABS tablet Take 1 tablet by mouth daily. 30 tablet 1  . pantoprazole (PROTONIX) 40 MG tablet Take 1 tablet (40 mg total) by mouth 2 (two) times daily before a meal. 60 tablet 0  . PHENobarbital (LUMINAL) 32.4 MG tablet Take 32.4 mg by mouth 2 (two) times daily.    . phenytoin (DILANTIN)  100 MG ER capsule Take 100 mg by mouth 3 (three) times daily.    . potassium chloride SA (KLOR-CON) 20 MEQ tablet Take 20 mEq by mouth 3 (three) times daily.    . solifenacin (VESICARE) 5 MG tablet Take 5 mg by mouth daily.     No current facility-administered medications for this visit.    Allergies as of 01/07/2021  . (No Known Allergies)   Past Medical History:  Diagnosis Date  . Alcohol abuse   . Asthma   . Contracture of muscle of hand    right  . GERD  (gastroesophageal reflux disease)   . HTN (hypertension)   . PUD (peptic ulcer disease)    remote  . Seizures ([place])    since stroke, no recent seizures  . Stroke [facility])    age 36, required brain surgery effected right side   Past Surgical History:  Procedure Laterality Date  . ABDOMINAL HYSTERECTOMY  2004   complete  . BRAIN SURGERY     age 45 stroke  . COLONOSCOPY    . COLONOSCOPY WITH PROPOFOL  10/03/2012   Dr. [name]: moderate diverticulosis, small internal hemorrhoids, TUBULAR ADENOMA. Next colonoscopy 2023 per Dr. [name].  . ESOPHAGOGASTRODUODENOSCOPY N/A 10/18/2014   Dr. Gala Romney during inpatient hospitalization: severe exudative/erosive reflux esophagitis as source of trivial upper GI bleed. No varices   . ESOPHAGOGASTRODUODENOSCOPY (EGD) WITH PROPOFOL  10/03/2012   Dr. [name]: stricture at [place] junction s/p dilation, mild gastritis negative H.pylori   . ESOPHAGOGASTRODUODENOSCOPY (EGD) WITH PROPOFOL N/A 12/27/2017   Grade D esophagitis. Medium-sized hiatal hernia, normal duodenum  . right tib-fib fracture  2008  . SAVORY DILATION  10/03/2012   Procedure: SAVORY DILATION;  Surgeon: Danie Binder, MD;  Location: [facility];  Service: Endoscopy;  Laterality: N/A;  started at 1303,  dilated 12.8-16mm   Family History  Problem Relation Age of Onset  . Liver disease Sister        etoh  . Colon cancer Neg Hx    Family History  Problem Relation Age of Onset  . Liver disease Sister        etoh  . Colon cancer Neg Hx     ROS:  General: Negative for anorexia, weight loss, fever, chills, fatigue, weakness. ENT: Negative for hoarseness, difficulty swallowing , nasal congestion. CV: Negative for chest pain, angina, palpitations, dyspnea on exertion, peripheral edema.  Respiratory: Negative for dyspnea at rest, dyspnea on exertion, cough, sputum, wheezing.  GI: See history of present illness. GU:  Negative for dysuria, hematuria, urinary incontinence, urinary frequency, nocturnal  urination.  Endo: Negative for unusual weight change.    Physical Examination:   BP 126/67   Pulse 97   Temp (!) 97.1 F (36.2 C) (Temporal)   Ht 5\' 4"  (1.626 m)   Wt 156 lb 9.6 oz (71 kg)   BMI 26.88 kg/m   General: Well-nourished, well-developed in no acute distress.  Eyes: No icterus. Mouth: masked Heart: Regular rate and rhythm, no murmurs rubs or gallops.  Abdomen: Bowel sounds are normal, nontender, nondistended, no hepatosplenomegaly or masses, no abdominal bruits or hernia , no rebound or guarding.   Extremities: No lower extremity edema. No clubbing or deformities. Neuro: Alert and oriented x 4   Skin: Warm and dry, no jaundice.   Psych: Alert and cooperative, normal mood and affect.  Labs:  Lab Results  Component Value Date   CREATININE 0.72 12/19/2020   BUN 11 12/19/2020   NA 137 12/19/2020  K 3.9 12/19/2020   CL 105 12/19/2020   CO2 23 12/19/2020   Lab Results  Component Value Date   ALT 30 12/18/2020   AST 45 (H) 12/18/2020   ALKPHOS 120 12/18/2020   BILITOT 0.6 12/18/2020   Lab Results  Component Value Date   WBC 3.3 (L) 12/19/2020   HGB 9.1 (L) 12/19/2020   HCT 27.6 (L) 12/19/2020   MCV 85.4 12/19/2020   PLT 122 (L) 12/19/2020   Lab Results  Component Value Date   INR 1.0 07/16/2020   INR 1.1 11/01/2019   INR 1.1 10/08/2019   Lab Results  Component Value Date   LIPASE 26 11/01/2019   Lab Results  Component Value Date   VITAMINB12 395 12/18/2020   Lab Results  Component Value Date   FOLATE 6.9 12/18/2020   Lab Results  Component Value Date   INR 1.0 07/16/2020   INR 1.1 11/01/2019   INR 1.1 10/08/2019      Imaging Studies: CT Head Wo Contrast  Result Date: 12/18/2020 CLINICAL DATA:  Change in mental status prior stroke EXAM: CT HEAD WITHOUT CONTRAST TECHNIQUE: Contiguous axial images were obtained from the base of the skull through the vertex without intravenous contrast. COMPARISON:  April 02, 2020 FINDINGS: Brain: No  evidence of acute territorial infarction, hemorrhage, hydrocephalus,extra-axial collection or mass lesion/mass effect. Again noted are stable areas of encephalomalacia involving the bifrontal lobes and left temporal lobe. Stable clip is seen within the inferior right temporal lobe. Vascular: No hyperdense vessel or unexpected calcification. Skull: Again noted is a prior right frontal craniotomy. No fracture or focal lesion identified. Sinuses/Orbits: The visualized paranasal sinuses and mastoid air cells are clear. The orbits and globes intact. Other: None IMPRESSION: No acute intracranial abnormality. Findings consistent with age related atrophy and chronic small vessel ischemia Stable areas of encephalomalacia Electronically Signed   By: Prudencio Pair M.D.   On: 12/18/2020 01:33   CT Abdomen Pelvis W Contrast  Result Date: 12/18/2020 CLINICAL DATA:  Right-sided epigastric abdominal pain EXAM: CT ABDOMEN AND PELVIS WITH CONTRAST TECHNIQUE: Multidetector CT imaging of the abdomen and pelvis was performed using the standard protocol following bolus administration of intravenous contrast. CONTRAST:  117mL OMNIPAQUE IOHEXOL 300 MG/ML  SOLN COMPARISON:  10/23/2019 FINDINGS: Lower chest: The visualized lung bases are clear. The visualized heart and pericardium are unremarkable. Small hiatal hernia. Hepatobiliary: The liver contour is nodular in keeping with cirrhosis. No focal intrahepatic mass identified. Gallbladder unremarkable. No intra or extrahepatic biliary ductal dilation. Pancreas: Unremarkable Spleen: Unremarkable Adrenals/Urinary Tract: Adrenal glands are unremarkable. Kidneys are normal, without renal calculi, focal lesion, or hydronephrosis. Bladder is unremarkable. Stomach/Bowel: Severe descending and sigmoid colonic diverticulosis. The stomach, small bowel, and large bowel are otherwise unremarkable. Appendix normal. No free intraperitoneal gas or fluid. Vascular/Lymphatic: Mild atherosclerotic  calcification within the abdominal aorta. No aortic aneurysm. Circumaortic left renal vein. No pathologic adenopathy within the abdomen and pelvis. Reproductive: Uterus absent. No adnexal masses. Fluid noted within the vaginal cavity. Other: No abdominal wall hernia.  Rectum unremarkable. Musculoskeletal: No acute bone abnormality. Degenerative changes are seen within the lumbar spine. Degenerative changes are noted within the hips bilaterally. No lytic or blastic bone lesions. IMPRESSION: No acute intra-abdominal pathology. No definite radiographic explanation for the patient's reported symptoms. Cirrhosis. No obvious stigmata of portal venous hypertension. No hepatic mass identified. Severe distal colonic diverticulosis. Aortic Atherosclerosis (ICD10-I70.0). Electronically Signed   By: Fidela Salisbury MD   On: 12/18/2020 00:19  DG Chest Port 1 View  Result Date: 12/17/2020 CLINICAL DATA:  Weakness, epigastric abdominal pain EXAM: PORTABLE CHEST 1 VIEW COMPARISON:  11/01/2019 FINDINGS: The heart size and mediastinal contours are within normal limits. Both lungs are clear. The visualized skeletal structures are unremarkable. IMPRESSION: No active disease. Electronically Signed   By: Randa Ngo M.D.   On: 12/17/2020 22:52    Assessment/plan:  64 year old female with history of alcohol abuse, GERD, chronic alternating constipation and diarrhea, chronic normocytic anemia, rectal bleeding presenting to reschedule colonoscopy.  Recent admission for confusion, abdominal pain.  CT as outlined above with no etiology for her abdominal pain.  Rectal bleeding: Last colonoscopy in 2013.  Has not seen any rectal bleeding recently.  Suspect benign anorectal source.  Previously did not complete colonoscopy for reasons outlined above.  She is interested in rescheduling.  Her nurse aide presented with her today.  We discussed bowel prep, need for preop, need for following through this time with colonoscopy.  She will  need deep sedation by Dr. Abbey Chatters.  ASA III.  I have discussed the risks, alternatives, benefits with regards to but not limited to the risk of reaction to medication, bleeding, infection, perforation and the patient is agreeable to proceed. Written consent to be obtained.  Chronic alternating constipation and diarrhea.  Predominantly constipated.  Currently well controlled on Linzess 290 mcg daily.  Alcoholic cirrhosis: Currently up-to-date on hepatoma screening.  Recommended alcohol cessation.  Recommend return office visit August 2022.  Anemia: Likely multifactorial.  H&H has been stable.  Colonoscopy as planned.  Abdominal pain/GERD: In the setting of ongoing alcohol abuse.  Continue pantoprazole 40 mg twice daily before a meal.

## 2021-01-09 ENCOUNTER — Encounter: Payer: Self-pay | Admitting: Gastroenterology

## 2021-01-09 ENCOUNTER — Telehealth: Payer: Self-pay | Admitting: Gastroenterology

## 2021-01-09 NOTE — Telephone Encounter (Signed)
Patient needs follow-up visit in August 2022 for cirrhosis, GERD.

## 2021-01-13 ENCOUNTER — Encounter: Payer: Self-pay | Admitting: Internal Medicine

## 2021-01-13 NOTE — Progress Notes (Signed)
CC'ED TO PCP 

## 2021-01-19 ENCOUNTER — Ambulatory Visit: Payer: Medicare Other | Admitting: Gastroenterology

## 2021-02-10 ENCOUNTER — Other Ambulatory Visit: Payer: Self-pay

## 2021-02-10 ENCOUNTER — Encounter: Payer: Self-pay | Admitting: Orthopaedic Surgery

## 2021-02-10 ENCOUNTER — Ambulatory Visit (INDEPENDENT_AMBULATORY_CARE_PROVIDER_SITE_OTHER): Payer: Medicare Other | Admitting: Orthopaedic Surgery

## 2021-02-10 DIAGNOSIS — M25562 Pain in left knee: Secondary | ICD-10-CM

## 2021-02-10 DIAGNOSIS — G8929 Other chronic pain: Secondary | ICD-10-CM | POA: Diagnosis not present

## 2021-02-10 DIAGNOSIS — M25561 Pain in right knee: Secondary | ICD-10-CM | POA: Diagnosis not present

## 2021-02-10 MED ORDER — HYDROCODONE-ACETAMINOPHEN 5-325 MG PO TABS
ORAL_TABLET | ORAL | 0 refills | Status: DC
Start: 2021-02-10 — End: 2021-03-24

## 2021-02-10 NOTE — Progress Notes (Signed)
PROCEDURE NOTE:  The patient requests injections of the left knee , verbal consent was obtained.  The left knee was prepped appropriately after time out was performed.   Sterile technique was observed and injection of 1 cc of Celestone 6 mg with several cc's of plain xylocaine. Anesthesia was provided by ethyl chloride and a 20-gauge needle was used to inject the knee area. The injection was tolerated well.  A band aid dressing was applied.  The patient was advised to apply ice later today and tomorrow to the injection sight as needed.  PROCEDURE NOTE:  The patient requests injections of the right knee , verbal consent was obtained.  The right knee was prepped appropriately after time out was performed.   Sterile technique was observed and injection of 1 cc of Celestone 6 mg with several cc's of plain xylocaine. Anesthesia was provided by ethyl chloride and a 20-gauge needle was used to inject the knee area. The injection was tolerated well.  A band aid dressing was applied.  The patient was advised to apply ice later today and tomorrow to the injection sight as needed.  I have reviewed the [place] web site prior to prescribing narcotic medicine for this patient.   Return in six weeks.  Call if any problem.  Precautions discussed.   Electronically Signed Sanjuana Kava, MD 4/26/202210:00 AM

## 2021-02-12 NOTE — Patient Instructions (Signed)
[name]  02/12/2021     @PREFPERIOPPHARMACY @   Your procedure is scheduled on  02/17/2021   Report to [facility] at  [place].M.   Call this number if you have problems the morning of surgery:  [phone]   Remember:  Follow the diet and prep instructions given to you by the office.                       Take these medicines the morning of surgery with A SIP OF WATER  Hydrocodone (If needed), protonix, phenobarbital, dilantin, vesicare.  Use your inhaler before you come and bring your rescue inhaler with you.     Please brush your teeth.  Do not wear jewelry, make-up or nail polish.  Do not wear lotions, powders, or perfumes, or deodorant.  Do not shave 48 hours prior to surgery.  Men may shave face and neck.  Do not bring valuables to the hospital.  [facility] is not responsible for any belongings or valuables.  Contacts, dentures or bridgework may not be worn into surgery.  Leave your suitcase in the car.  After surgery it may be brought to your room.  For patients admitted to the hospital, discharge time will be determined by your treatment team.  Patients discharged the day of surgery will not be allowed to drive home and must have someone with them for 24 hours.      Special instructions:  DO NOT smoke tobacco or vape for 24 hours before your procedure.  Please read over the following fact sheets that you were given. Anesthesia Post-op Instructions and Care and Recovery After Surgery       Colonoscopy, Adult, Care After This sheet gives you information about how to care for yourself after your procedure. Your health care provider may also give you more specific instructions. If you have problems or questions, contact your health care provider. What can I expect after the procedure? After the procedure, it is common to have:  A small amount of blood in your stool for 24 hours after the procedure.  Some gas.  Mild cramping or bloating of  your abdomen. Follow these instructions at home: Eating and drinking  Drink enough fluid to keep your urine pale yellow.  Follow instructions from your health care provider about eating or drinking restrictions.  Resume your normal diet as instructed by your health care provider. Avoid heavy or fried foods that are hard to digest.   Activity  Rest as told by your health care provider.  Avoid sitting for a long time without moving. Get up to take short walks every 1-2 hours. This is important to improve blood flow and breathing. Ask for help if you feel weak or unsteady.  Return to your normal activities as told by your health care provider. Ask your health care provider what activities are safe for you. Managing cramping and bloating  Try walking around when you have cramps or feel bloated.  Apply heat to your abdomen as told by your health care provider. Use the heat source that your health care provider recommends, such as a moist heat pack or a heating pad. ? Place a towel between your skin and the heat source. ? Leave the heat on for 20-30 minutes. ? Remove the heat if your skin turns bright red. This is especially important if you are unable to feel pain, heat, or cold. You may have  a greater risk of getting burned.   General instructions  If you were given a sedative during the procedure, it can affect you for several hours. Do not drive or operate machinery until your health care provider says that it is safe.  For the first 24 hours after the procedure: ? Do not sign important documents. ? Do not drink alcohol. ? Do your regular daily activities at a slower pace than normal. ? Eat soft foods that are easy to digest.  Take over-the-counter and prescription medicines only as told by your health care provider.  Keep all follow-up visits as told by your health care provider. This is important. Contact a health care provider if:  You have blood in your stool 2-3 days after  the procedure. Get help right away if you have:  More than a small spotting of blood in your stool.  Large blood clots in your stool.  Swelling of your abdomen.  Nausea or vomiting.  A fever.  Increasing pain in your abdomen that is not relieved with medicine. Summary  After the procedure, it is common to have a small amount of blood in your stool. You may also have mild cramping and bloating of your abdomen.  If you were given a sedative during the procedure, it can affect you for several hours. Do not drive or operate machinery until your health care provider says that it is safe.  Get help right away if you have a lot of blood in your stool, nausea or vomiting, a fever, or increased pain in your abdomen. This information is not intended to replace advice given to you by your health care provider. Make sure you discuss any questions you have with your health care provider. Document Revised: 09/28/2019 Document Reviewed: 04/30/2019 Elsevier Patient Education  2021 [place] After This sheet gives you information about how to care for yourself after your procedure. Your health care provider may also give you more specific instructions. If you have problems or questions, contact your health care provider. What can I expect after the procedure? After the procedure, it is common to have:  Tiredness.  Forgetfulness about what happened after the procedure.  Impaired judgment for important decisions.  Nausea or vomiting.  Some difficulty with balance. Follow these instructions at home: For the time period you were told by your health care provider:  Rest as needed.  Do not participate in activities where you could fall or become injured.  Do not drive or use machinery.  Do not drink alcohol.  Do not take sleeping pills or medicines that cause drowsiness.  Do not make important decisions or sign legal documents.  Do not take care of  children on your own.      Eating and drinking  Follow the diet that is recommended by your health care provider.  Drink enough fluid to keep your urine pale yellow.  If you vomit: ? Drink water, juice, or soup when you can drink without vomiting. ? Make sure you have little or no nausea before eating solid foods. General instructions  Have a responsible adult stay with you for the time you are told. It is important to have someone help care for you until you are awake and alert.  Take over-the-counter and prescription medicines only as told by your health care provider.  If you have sleep apnea, surgery and certain medicines can increase your risk for breathing problems. Follow instructions from your health care provider about  wearing your sleep device: ? Anytime you are sleeping, including during daytime naps. ? While taking prescription pain medicines, sleeping medicines, or medicines that make you drowsy.  Avoid smoking.  Keep all follow-up visits as told by your health care provider. This is important. Contact a health care provider if:  You keep feeling nauseous or you keep vomiting.  You feel light-headed.  You are still sleepy or having trouble with balance after 24 hours.  You develop a rash.  You have a fever.  You have redness or swelling around the IV site. Get help right away if:  You have trouble breathing.  You have new-onset confusion at home. Summary  For several hours after your procedure, you may feel tired. You may also be forgetful and have poor judgment.  Have a responsible adult stay with you for the time you are told. It is important to have someone help care for you until you are awake and alert.  Rest as told. Do not drive or operate machinery. Do not drink alcohol or take sleeping pills.  Get help right away if you have trouble breathing, or if you suddenly become confused. This information is not intended to replace advice given to you  by your health care provider. Make sure you discuss any questions you have with your health care provider. Document Revised: 06/19/2020 Document Reviewed: 09/06/2019 Elsevier Patient Education  2021 [employer].

## 2021-02-13 ENCOUNTER — Other Ambulatory Visit: Payer: Self-pay

## 2021-02-13 ENCOUNTER — Other Ambulatory Visit (HOSPITAL_COMMUNITY)
Admission: RE | Admit: 2021-02-13 | Discharge: 2021-02-13 | Disposition: A | Discharge status: Home / Self Care | Payer: Medicare Other | Source: Ambulatory Visit | Attending: Internal Medicine | Admitting: Internal Medicine

## 2021-02-13 ENCOUNTER — Encounter (HOSPITAL_COMMUNITY): Payer: Self-pay

## 2021-02-13 ENCOUNTER — Encounter (HOSPITAL_COMMUNITY)
Admission: RE | Admit: 2021-02-13 | Discharge: 2021-02-13 | Disposition: A | Discharge status: Home / Self Care | Payer: Medicare Other | Source: Ambulatory Visit | Attending: Internal Medicine | Admitting: Internal Medicine

## 2021-02-13 DIAGNOSIS — Z01812 Encounter for preprocedural laboratory examination: Secondary | ICD-10-CM | POA: Insufficient documentation

## 2021-02-13 DIAGNOSIS — Z20822 Contact with and (suspected) exposure to covid-19: Secondary | ICD-10-CM | POA: Diagnosis not present

## 2021-02-13 HISTORY — DX: Sleep apnea, unspecified: G47.30

## 2021-02-13 LAB — COMPREHENSIVE METABOLIC PANEL
ALT: 32 U/L (ref 0–44)
AST: 45 U/L — ABNORMAL HIGH (ref 15–41)
Albumin: 3.8 g/dL (ref 3.5–5.0)
Alkaline Phosphatase: 106 U/L (ref 38–126)
Anion gap: 9 (ref 5–15)
BUN: 9 mg/dL (ref 8–23)
CO2: 22 mmol/L (ref 22–32)
Calcium: 8.5 mg/dL — ABNORMAL LOW (ref 8.9–10.3)
Chloride: 96 mmol/L — ABNORMAL LOW (ref 98–111)
Creatinine, Ser: 0.58 mg/dL (ref 0.44–1.00)
GFR, Estimated: >60 mL/min (ref >60)
Glucose, Bld: 87 mg/dL (ref 70–99)
Potassium: 3.9 mmol/L (ref 3.5–5.1)
Sodium: 127 mmol/L — ABNORMAL LOW (ref 135–145)
Total Bilirubin: 0.6 mg/dL (ref 0.3–1.2)
Total Protein: 7.9 g/dL (ref 6.5–8.1)

## 2021-02-13 LAB — CBC WITH DIFFERENTIAL/PLATELET
Abs Immature Granulocytes: 0.01 10*3/uL (ref 0.00–0.07)
Basophils Absolute: 0 10*3/uL (ref 0.0–0.1)
Basophils Relative: 1 %
Eosinophils Absolute: 0.1 10*3/uL (ref 0.0–0.5)
Eosinophils Relative: 1 %
HCT: 29.3 % — ABNORMAL LOW (ref 36.0–46.0)
Hemoglobin: 9.6 g/dL — ABNORMAL LOW (ref 12.0–15.0)
Immature Granulocytes: 0 %
Lymphocytes Relative: 48 %
Lymphs Abs: 1.8 10*3/uL (ref 0.7–4.0)
MCH: 27.9 pg (ref 26.0–34.0)
MCHC: 32.8 g/dL (ref 30.0–36.0)
MCV: 85.2 fL (ref 80.0–100.0)
Monocytes Absolute: 0.5 10*3/uL (ref 0.1–1.0)
Monocytes Relative: 13 %
Neutro Abs: 1.4 10*3/uL — ABNORMAL LOW (ref 1.7–7.7)
Neutrophils Relative %: 37 %
Platelets: 181 10*3/uL (ref 150–400)
RBC: 3.44 MIL/uL — ABNORMAL LOW (ref 3.87–5.11)
RDW: 16 % — ABNORMAL HIGH (ref 11.5–15.5)
WBC: 3.8 10*3/uL — ABNORMAL LOW (ref 4.0–10.5)
nRBC: 0 % (ref 0.0–0.2)

## 2021-02-14 LAB — SARS CORONAVIRUS 2 (TAT 6-24 HRS): SARS Coronavirus 2: NEGATIVE

## 2021-02-16 ENCOUNTER — Encounter (HOSPITAL_COMMUNITY): Payer: Self-pay | Admitting: Anesthesiology

## 2021-02-17 ENCOUNTER — Encounter (HOSPITAL_COMMUNITY): Admission: RE | Payer: Self-pay | Source: Home / Self Care

## 2021-02-17 ENCOUNTER — Ambulatory Visit (HOSPITAL_COMMUNITY): Admission: RE | Admit: 2021-02-17 | Payer: Medicare Other | Source: Home / Self Care

## 2021-02-17 ENCOUNTER — Telehealth: Payer: Self-pay | Admitting: Internal Medicine

## 2021-02-17 SURGERY — COLONOSCOPY WITH PROPOFOL
Anesthesia: Monitor Anesthesia Care

## 2021-02-17 NOTE — Telephone Encounter (Signed)
Patient needs to reschedule procedure. She was on for today with Dr Abbey Chatters. [phone]

## 2021-02-17 NOTE — Telephone Encounter (Signed)
Noted  

## 2021-02-17 NOTE — Telephone Encounter (Signed)
Called pt. She states she wants to cancel procedure. She did not prep. She did not want to r/s at this time. States she will call back when she is ready. FYI to leslie.

## 2021-02-17 NOTE — OR Nursing (Signed)
Patient aid called due to patient no show at 0630 this am. Aid stated that [name] did not do her prep and wasn't planning to come. Attempted to call patient . She does not answer her phone. Procedure cancelled.

## 2021-02-20 ENCOUNTER — Other Ambulatory Visit (HOSPITAL_COMMUNITY): Payer: Self-pay | Admitting: Internal Medicine

## 2021-02-20 DIAGNOSIS — Z1231 Encounter for screening mammogram for malignant neoplasm of breast: Secondary | ICD-10-CM

## 2021-02-22 ENCOUNTER — Other Ambulatory Visit: Payer: Self-pay

## 2021-02-22 ENCOUNTER — Encounter (HOSPITAL_COMMUNITY): Payer: Self-pay | Admitting: Emergency Medicine

## 2021-02-22 ENCOUNTER — Emergency Department (HOSPITAL_COMMUNITY): Payer: Medicare Other

## 2021-02-22 ENCOUNTER — Emergency Department (HOSPITAL_COMMUNITY)
Admission: EM | Admit: 2021-02-22 | Discharge: 2021-02-22 | Disposition: A | Discharge status: Home / Self Care | Payer: Medicare Other | Attending: Emergency Medicine | Admitting: Emergency Medicine

## 2021-02-22 DIAGNOSIS — F1099 Alcohol use, unspecified with unspecified alcohol-induced disorder: Secondary | ICD-10-CM | POA: Diagnosis not present

## 2021-02-22 DIAGNOSIS — Z79899 Other long term (current) drug therapy: Secondary | ICD-10-CM | POA: Insufficient documentation

## 2021-02-22 DIAGNOSIS — J45909 Unspecified asthma, uncomplicated: Secondary | ICD-10-CM | POA: Diagnosis not present

## 2021-02-22 DIAGNOSIS — Y908 Blood alcohol level of 240 mg/100 ml or more: Secondary | ICD-10-CM | POA: Diagnosis not present

## 2021-02-22 DIAGNOSIS — R1033 Periumbilical pain: Secondary | ICD-10-CM | POA: Insufficient documentation

## 2021-02-22 DIAGNOSIS — F10920 Alcohol use, unspecified with intoxication, uncomplicated: Secondary | ICD-10-CM

## 2021-02-22 DIAGNOSIS — I10 Essential (primary) hypertension: Secondary | ICD-10-CM | POA: Diagnosis not present

## 2021-02-22 DIAGNOSIS — Z87891 Personal history of nicotine dependence: Secondary | ICD-10-CM | POA: Diagnosis not present

## 2021-02-22 LAB — CBC WITH DIFFERENTIAL/PLATELET
Abs Immature Granulocytes: 0.01 10*3/uL (ref 0.00–0.07)
Basophils Absolute: 0 10*3/uL (ref 0.0–0.1)
Basophils Relative: 0 %
Eosinophils Absolute: 0 10*3/uL (ref 0.0–0.5)
Eosinophils Relative: 1 %
HCT: 29.2 % — ABNORMAL LOW (ref 36.0–46.0)
Hemoglobin: 9.7 g/dL — ABNORMAL LOW (ref 12.0–15.0)
Immature Granulocytes: 0 %
Lymphocytes Relative: 28 %
Lymphs Abs: 1.4 10*3/uL (ref 0.7–4.0)
MCH: 28.1 pg (ref 26.0–34.0)
MCHC: 33.2 g/dL (ref 30.0–36.0)
MCV: 84.6 fL (ref 80.0–100.0)
Monocytes Absolute: 0.4 10*3/uL (ref 0.1–1.0)
Monocytes Relative: 8 %
Neutro Abs: 3.1 10*3/uL (ref 1.7–7.7)
Neutrophils Relative %: 63 %
Platelets: 128 10*3/uL — ABNORMAL LOW (ref 150–400)
RBC: 3.45 MIL/uL — ABNORMAL LOW (ref 3.87–5.11)
RDW: 16.3 % — ABNORMAL HIGH (ref 11.5–15.5)
WBC: 4.9 10*3/uL (ref 4.0–10.5)
nRBC: 0 % (ref 0.0–0.2)

## 2021-02-22 LAB — URINALYSIS, ROUTINE W REFLEX MICROSCOPIC
Bilirubin Urine: NEGATIVE
Glucose, UA: NEGATIVE mg/dL
Hgb urine dipstick: NEGATIVE
Ketones, ur: NEGATIVE mg/dL
Leukocytes,Ua: NEGATIVE
Nitrite: NEGATIVE
Protein, ur: NEGATIVE mg/dL
Specific Gravity, Urine: 1.005 (ref 1.005–1.030)
pH: 6 (ref 5.0–8.0)

## 2021-02-22 LAB — COMPREHENSIVE METABOLIC PANEL
ALT: 26 U/L (ref 0–44)
AST: 41 U/L (ref 15–41)
Albumin: 3.9 g/dL (ref 3.5–5.0)
Alkaline Phosphatase: 123 U/L (ref 38–126)
Anion gap: 12 (ref 5–15)
BUN: 5 mg/dL — ABNORMAL LOW (ref 8–23)
CO2: 24 mmol/L (ref 22–32)
Calcium: 8.7 mg/dL — ABNORMAL LOW (ref 8.9–10.3)
Chloride: 96 mmol/L — ABNORMAL LOW (ref 98–111)
Creatinine, Ser: 0.5 mg/dL (ref 0.44–1.00)
GFR, Estimated: >60 mL/min (ref >60)
Glucose, Bld: 117 mg/dL — ABNORMAL HIGH (ref 70–99)
Potassium: 2.8 mmol/L — ABNORMAL LOW (ref 3.5–5.1)
Sodium: 132 mmol/L — ABNORMAL LOW (ref 135–145)
Total Bilirubin: 0.5 mg/dL (ref 0.3–1.2)
Total Protein: 8 g/dL (ref 6.5–8.1)

## 2021-02-22 LAB — ETHANOL: Alcohol, Ethyl (B): 308 mg/dL (ref <10)

## 2021-02-22 LAB — LIPASE, BLOOD: Lipase: 37 U/L (ref 11–51)

## 2021-02-22 LAB — PHENOBARBITAL LEVEL: Phenobarbital: 9.4 ug/mL — ABNORMAL LOW (ref 15.0–30.0)

## 2021-02-22 LAB — PHENYTOIN LEVEL, TOTAL: Phenytoin Lvl: 2.5 ug/mL — ABNORMAL LOW (ref 10.0–20.0)

## 2021-02-22 MED ORDER — SODIUM CHLORIDE 0.9 % IV BOLUS
500.0000 mL | Freq: Once | INTRAVENOUS | Status: AC
  Administered 2021-02-22: 500 mL via INTRAVENOUS

## 2021-02-22 MED ORDER — IOHEXOL 300 MG/ML  SOLN
100.0000 mL | Freq: Once | INTRAMUSCULAR | Status: AC | PRN
  Administered 2021-02-22: 100 mL via INTRAVENOUS

## 2021-02-22 NOTE — ED Provider Notes (Signed)
[facility] EMERGENCY DEPARTMENT Provider Note   CSN: [id number] Arrival date & time: 02/22/21  0302     History Chief Complaint  Patient presents with  . Abdominal Pain    [name] is a 65 y.o. female.  Patient is a 65 year old female with extensive past medical history including alcohol abuse, prior CVA with right hemiparesis, peptic ulcer disease, hypertension.  Patient presenting today for evaluation of abdominal pain.  Patient's history somewhat limited secondary to stroke/communication difficulty.  She describes pain across the center of her abdomen with no associated vomiting or diarrhea.  She denies to me that she is having any fevers or chills.  Patient drinks alcohol daily and apparently has been consuming alcohol again this evening prior to coming here.  The history is provided by the patient.  Abdominal Pain Pain location:  Periumbilical Pain quality: cramping   Pain radiates to:  Does not radiate Pain severity:  Moderate Onset quality:  Gradual Duration:  2 weeks Timing:  Constant Progression:  Worsening Chronicity:  New Context: alcohol use   Relieved by:  Nothing Worsened by:  Palpation and movement      Past Medical History:  Diagnosis Date  . Alcohol abuse   . Asthma   . Contracture of muscle of hand    right  . GERD (gastroesophageal reflux disease)   . HTN (hypertension)   . PUD (peptic ulcer disease)    remote  . Seizures ([place])    since stroke, no recent seizures  . Sleep apnea   . Stroke [facility])    age 36, required brain surgery effected right side    Patient Active Problem List   Diagnosis Date Noted  . Lactic acidosis 12/19/2020  . Encephalopathy acute 12/18/2020  . Acute metabolic encephalopathy 44/10/270  . Acute blood loss anemia 11/02/2019  . Overweight (BMI 25.0-29.9) 11/02/2019  . Gait disturbance   . Rectal bleeding 10/08/2019  . Hyponatremia   . Diarrhea 04/11/2019  . Nausea, vomiting, and diarrhea   . ARF (acute  renal failure) ([place]) 04/10/2019  . Acute kidney injury ([place]) 01/22/2019  . History of stroke 01/22/2019  . Asthma 01/22/2019  . Gastroenteritis 01/22/2019  . Stroke ([place]) 06/14/2018  . Cirrhosis of liver without ascites ([place]) 04/21/2017  . AKI (acute kidney injury) ([place]) 07/30/2016  . Anemia 11/25/2015  . Abnormal liver function   . Coffee ground emesis   . Reflux esophagitis   . Sinus tachycardia ([place]) 10/10/2014  . Hypokalemia 10/10/2014  . Seizure disorder ([place]) 10/10/2014  . Alcohol abuse 09/05/2012  . Odynophagia 09/05/2012  . GERD (gastroesophageal reflux disease) 09/05/2012  . Abdominal pain 09/05/2012  . FX CLOSED TIBIA NOS 07/27/2007  . STROKE-With also h/o Aneurysm clipping? 07/25/2007  . Seizures ([place]) 07/25/2007    Past Surgical History:  Procedure Laterality Date  . ABDOMINAL HYSTERECTOMY  2004   complete  . BRAIN SURGERY     age 80 stroke  . COLONOSCOPY    . COLONOSCOPY WITH PROPOFOL  10/03/2012   Dr. [name]: moderate diverticulosis, small internal hemorrhoids, TUBULAR ADENOMA. Next colonoscopy 2023 per Dr. [name].  . ESOPHAGOGASTRODUODENOSCOPY N/A 10/18/2014   Dr. Gala Romney during inpatient hospitalization: severe exudative/erosive reflux esophagitis as source of trivial upper GI bleed. No varices   . ESOPHAGOGASTRODUODENOSCOPY (EGD) WITH PROPOFOL  10/03/2012   Dr. [name]: stricture at [place] s/p dilation, mild gastritis negative H.pylori   . ESOPHAGOGASTRODUODENOSCOPY (EGD) WITH PROPOFOL N/A 12/27/2017   Grade D esophagitis. Medium-sized hiatal hernia, normal  duodenum  . right tib-fib fracture  2008  . SAVORY DILATION  10/03/2012   Procedure: SAVORY DILATION;  Surgeon: Danie Binder, MD;  Location: [facility];  Service: Endoscopy;  Laterality: N/A;  started at 1303,  dilated 12.8-16mm     OB History    Gravida  4   Para  3   Term  3   Preterm      AB  1   Living  3     SAB  1   IAB      Ectopic      Multiple      Live Births               Family History  Problem Relation Age of Onset  . Liver disease Sister        etoh  . Colon cancer Neg Hx     Social History   Tobacco Use  . Smoking status: Former Smoker    Packs/day: 0.04    Years: 9.00    Pack years: 0.36    Types: Cigarettes    Quit date: 07/28/2014    Years since quitting: 6.5  . Smokeless tobacco: Never Used  Vaping Use  . Vaping Use: Never used  Substance Use Topics  . Alcohol use: Yes    Alcohol/week: 35.0 standard drinks    Types: 35 Cans of beer per week    Comment: Patient reports she drinks "a lot" of beer each day; 01/07/21 "1 or 2 at night"  . Drug use: No    Home Medications Prior to Admission medications   Medication Sig Start Date End Date Taking? Authorizing Provider  albuterol (PROVENTIL HFA;VENTOLIN HFA) 108 (90 BASE) MCG/ACT inhaler Inhale 2 puffs into the lungs every 6 (six) hours as needed for wheezing or shortness of breath.     [provider]  ENSURE PLUS (ENSURE PLUS) LIQD Take 237 mLs by mouth 3 (three) times daily between meals.    [provider]  folic acid (FOLVITE) 557 MCG tablet Take 400 mcg by mouth daily.    [provider]  HYDROcodone-acetaminophen (NORCO/VICODIN) 5-325 MG tablet One tablet every six hours for pain.  Limit 7 days. 02/10/21   Sanjuana Kava, MD  linaclotide [facility]) 290 MCG CAPS capsule Take 1 capsule (290 mcg total) by mouth daily before breakfast. 09/26/20   [name], PA-C  lisinopril (ZESTRIL) 40 MG tablet Take 1 tablet (40 mg total) by mouth daily. 10/09/19   Roxan Hockey, MD  pantoprazole (PROTONIX) 40 MG tablet Take 1 tablet (40 mg total) by mouth 2 (two) times daily before a meal. 11/05/19   Gosrani, Nimish C, MD  PHENobarbital (LUMINAL) 32.4 MG tablet Take 32.4 mg by mouth 2 (two) times daily. 07/13/19   [provider]  phenytoin (DILANTIN) 100 MG ER capsule Take 100 mg by mouth 3 (three) times daily.    [provider]  potassium chloride  SA (KLOR-CON) 20 MEQ tablet Take 20 mEq by mouth 3 (three) times daily. 08/25/20   [provider]  solifenacin (VESICARE) 5 MG tablet Take 5 mg by mouth daily. 03/11/20   [provider]    Allergies    Patient has no known allergies.  Review of Systems   Review of Systems  Gastrointestinal: Positive for abdominal pain.  All other systems reviewed and are negative.   Physical Exam Updated Vital Signs BP (!) 150/58   Pulse 96   Temp (!) 97.5 F (  36.4 C) (Axillary)   Resp 18   Ht 5\' 4"  (1.626 m)   Wt 70.8 kg   SpO2 99%   BMI 26.79 kg/m   Physical Exam Vitals and nursing note reviewed.  Constitutional:      General: She is not in acute distress.    Appearance: She is well-developed. She is not diaphoretic.  HENT:     Head: Normocephalic and atraumatic.  Cardiovascular:     Rate and Rhythm: Normal rate and regular rhythm.     Heart sounds: No murmur heard. No friction rub. No gallop.   Pulmonary:     Effort: Pulmonary effort is normal. No respiratory distress.     Breath sounds: Normal breath sounds. No wheezing.  Abdominal:     General: Bowel sounds are normal. There is no distension.     Palpations: Abdomen is soft.     Tenderness: There is abdominal tenderness in the periumbilical area. There is no right CVA tenderness, left CVA tenderness, guarding or rebound.  Musculoskeletal:        General: Normal range of motion.     Cervical back: Normal range of motion and neck supple.  Skin:    General: Skin is warm and dry.  Neurological:     Mental Status: She is alert and oriented to person, place, and time.     ED Results / Procedures / Treatments   Labs (all labs ordered are listed, but only abnormal results are displayed) Labs Reviewed  COMPREHENSIVE METABOLIC PANEL  ETHANOL  CBC WITH DIFFERENTIAL/PLATELET  LIPASE, BLOOD  URINALYSIS, ROUTINE W REFLEX MICROSCOPIC  AMMONIA  PHENYTOIN LEVEL, TOTAL  PHENOBARBITAL LEVEL     EKG None  Radiology No results found.  Procedures Procedures   Medications Ordered in ED Medications  sodium chloride 0.9 % bolus 500 mL (has no administration in time range)    ED Course  I have reviewed the triage vital signs and the nursing notes.  Pertinent labs & imaging results that were available during my care of the patient were reviewed by me and considered in my medical decision making (see chart for details).    MDM Rules/Calculators/A&P  Patient is a 65 year old female with past medical history as outlined in the HPI.  She presents with abdominal pain that I suspect is related to gastritis.  Patient with chronic alcoholism.  Tonight she was apparently drinking heavily when she developed abdominal discomfort.  Her blood alcohol level tonight is 308 and work-up is otherwise unremarkable including CBC, LFTs, and lipase.  Her CT scan shows no acute intra-abdominal process.  She does have findings consistent with cirrhosis, however this is not a new finding.  At this point, I see no indication for admission.  There appears to be no emergent process at play and patient seems appropriate for discharge.  Final Clinical Impression(s) / ED Diagnoses Final diagnoses:  None    Rx / DC Orders ED Discharge Orders    None       Veryl Speak, MD 02/22/21 [phone]

## 2021-02-22 NOTE — Discharge Instructions (Addendum)
Begin taking Prilosec 20 mg twice daily for the next 2 weeks.  This medication is available over-the-counter.  Return to the emergency department if you develop worsening pain, high fever, bloody stool or vomit, or other new and concerning symptoms.

## 2021-02-22 NOTE — ED Triage Notes (Addendum)
[facility] - pt c/o abdominal pain x 2 weeks. ETOH on board. Pt was supposed to have a colonoscopy but did not go

## 2021-02-22 NOTE — ED Notes (Signed)
Date and time results received: 02/22/21 0420  Test: alcohol Critical Value: 308  Name of Provider Notified: Trish Mage, MD  Orders Received? Or Actions Taken?: n/a

## 2021-03-24 ENCOUNTER — Ambulatory Visit (INDEPENDENT_AMBULATORY_CARE_PROVIDER_SITE_OTHER): Payer: Medicare Other | Admitting: Orthopaedic Surgery

## 2021-03-24 ENCOUNTER — Encounter: Payer: Self-pay | Admitting: Orthopaedic Surgery

## 2021-03-24 ENCOUNTER — Other Ambulatory Visit: Payer: Self-pay

## 2021-03-24 DIAGNOSIS — M25561 Pain in right knee: Secondary | ICD-10-CM

## 2021-03-24 DIAGNOSIS — G8929 Other chronic pain: Secondary | ICD-10-CM | POA: Diagnosis not present

## 2021-03-24 DIAGNOSIS — R531 Weakness: Secondary | ICD-10-CM

## 2021-03-24 MED ORDER — HYDROCODONE-ACETAMINOPHEN 5-325 MG PO TABS: ORAL_TABLET | ORAL | 0 refills | Status: DC

## 2021-03-24 NOTE — Progress Notes (Signed)
PROCEDURE NOTE:  The patient requests injections of the right knee , verbal consent was obtained.  The right knee was prepped appropriately after time out was performed.   Sterile technique was observed and injection of 1 cc of DepoMedrol 40mg  with several cc's of plain xylocaine. Anesthesia was provided by ethyl chloride and a 20-gauge needle was used to inject the knee area. The injection was tolerated well.  A band aid dressing was applied.  The patient was advised to apply ice later today and tomorrow to the injection sight as needed.  I have reviewed the [place] web site prior to prescribing narcotic medicine for this patient.   Return in six weeks.  Call if any problem.  Precautions discussed.   Electronically Pen Mar, MD 6/7/20229:03 AM

## 2021-03-30 ENCOUNTER — Ambulatory Visit (HOSPITAL_COMMUNITY)
Admission: RE | Admit: 2021-03-30 | Discharge: 2021-03-30 | Disposition: A | Discharge status: Home / Self Care | Payer: Medicare Other | Source: Ambulatory Visit | Attending: Internal Medicine | Admitting: Internal Medicine

## 2021-03-30 ENCOUNTER — Other Ambulatory Visit: Payer: Self-pay

## 2021-03-30 DIAGNOSIS — Z1231 Encounter for screening mammogram for malignant neoplasm of breast: Secondary | ICD-10-CM | POA: Diagnosis not present

## 2021-04-09 ENCOUNTER — Other Ambulatory Visit: Payer: Self-pay

## 2021-04-09 ENCOUNTER — Emergency Department (HOSPITAL_COMMUNITY)
Admission: EM | Admit: 2021-04-09 | Discharge: 2021-04-09 | Disposition: A | Discharge status: Home / Self Care | Payer: Medicare Other | Attending: Emergency Medicine | Admitting: Emergency Medicine

## 2021-04-09 ENCOUNTER — Encounter (HOSPITAL_COMMUNITY): Payer: Self-pay | Admitting: Emergency Medicine

## 2021-04-09 DIAGNOSIS — Z79899 Other long term (current) drug therapy: Secondary | ICD-10-CM | POA: Insufficient documentation

## 2021-04-09 DIAGNOSIS — I10 Essential (primary) hypertension: Secondary | ICD-10-CM | POA: Diagnosis not present

## 2021-04-09 DIAGNOSIS — E871 Hypo-osmolality and hyponatremia: Secondary | ICD-10-CM | POA: Diagnosis not present

## 2021-04-09 DIAGNOSIS — Z87891 Personal history of nicotine dependence: Secondary | ICD-10-CM | POA: Insufficient documentation

## 2021-04-09 DIAGNOSIS — R531 Weakness: Secondary | ICD-10-CM | POA: Diagnosis not present

## 2021-04-09 DIAGNOSIS — J45909 Unspecified asthma, uncomplicated: Secondary | ICD-10-CM | POA: Diagnosis not present

## 2021-04-09 LAB — COMPREHENSIVE METABOLIC PANEL
ALT: 24 U/L (ref 0–44)
AST: 41 U/L (ref 15–41)
Albumin: 3.9 g/dL (ref 3.5–5.0)
Alkaline Phosphatase: 97 U/L (ref 38–126)
Anion gap: 7 (ref 5–15)
BUN: 9 mg/dL (ref 8–23)
CO2: 21 mmol/L — ABNORMAL LOW (ref 22–32)
Calcium: 9 mg/dL (ref 8.9–10.3)
Chloride: 100 mmol/L (ref 98–111)
Creatinine, Ser: 0.74 mg/dL (ref 0.44–1.00)
GFR, Estimated: >60 mL/min (ref >60)
Glucose, Bld: 118 mg/dL — ABNORMAL HIGH (ref 70–99)
Potassium: 4 mmol/L (ref 3.5–5.1)
Sodium: 128 mmol/L — ABNORMAL LOW (ref 135–145)
Total Bilirubin: 0.6 mg/dL (ref 0.3–1.2)
Total Protein: 7.4 g/dL (ref 6.5–8.1)

## 2021-04-09 LAB — CBC WITH DIFFERENTIAL/PLATELET
Abs Immature Granulocytes: 0.01 10*3/uL (ref 0.00–0.07)
Basophils Absolute: 0 10*3/uL (ref 0.0–0.1)
Basophils Relative: 1 %
Eosinophils Absolute: 0 10*3/uL (ref 0.0–0.5)
Eosinophils Relative: 1 %
HCT: 28 % — ABNORMAL LOW (ref 36.0–46.0)
Hemoglobin: 9.1 g/dL — ABNORMAL LOW (ref 12.0–15.0)
Immature Granulocytes: 0 %
Lymphocytes Relative: 33 %
Lymphs Abs: 1.2 10*3/uL (ref 0.7–4.0)
MCH: 26.8 pg (ref 26.0–34.0)
MCHC: 32.5 g/dL (ref 30.0–36.0)
MCV: 82.4 fL (ref 80.0–100.0)
Monocytes Absolute: 0.6 10*3/uL (ref 0.1–1.0)
Monocytes Relative: 15 %
Neutro Abs: 1.9 10*3/uL (ref 1.7–7.7)
Neutrophils Relative %: 50 %
Platelets: 131 10*3/uL — ABNORMAL LOW (ref 150–400)
RBC: 3.4 MIL/uL — ABNORMAL LOW (ref 3.87–5.11)
RDW: 16.5 % — ABNORMAL HIGH (ref 11.5–15.5)
WBC: 3.7 10*3/uL — ABNORMAL LOW (ref 4.0–10.5)
nRBC: 0 % (ref 0.0–0.2)

## 2021-04-09 MED ORDER — SODIUM CHLORIDE 0.9 % IV BOLUS
500.0000 mL | Freq: Once | INTRAVENOUS | Status: AC
  Administered 2021-04-09: 500 mL via INTRAVENOUS

## 2021-04-09 NOTE — ED Provider Notes (Signed)
[facility] Provider Note   CSN: [id number] Arrival date & time: 04/09/21  9678     History No chief complaint on file.   [name] is a 65 y.o. female.  HPI     [name] is a 65 y.o. female with past medical history of alcohol abuse, asthma, hypertension, seizures, and CVA with right sided hemiparesis who presents to the Emergency Department for evaluation of possible hyponatremia.  States that she was seen by her PCP last week and contacted this morning stating that her blood work showed that her sodium level was low and she was recommended to come to the emergency department for further evaluation.  Patient denies any symptoms at this time.  She does admit to some recent loose stools, but denies dehydration, vomiting, recent seizures, or chest or abdominal pain.  Past Medical History:  Diagnosis Date   Alcohol abuse    Asthma    Contracture of muscle of hand    right   GERD (gastroesophageal reflux disease)    HTN (hypertension)    PUD (peptic ulcer disease)    remote   Seizures ([place])    since stroke, no recent seizures   Sleep apnea    Stroke [facility])    age 66, required brain surgery effected right side    Patient Active Problem List   Diagnosis Date Noted   Lactic acidosis 12/19/2020   Encephalopathy acute 93/81/0175   Acute metabolic encephalopathy 08/11/8526   Acute blood loss anemia 11/02/2019   Overweight (BMI 25.0-29.9) 11/02/2019   Gait disturbance    Rectal bleeding 10/08/2019   Hyponatremia    Diarrhea 04/11/2019   Nausea, vomiting, and diarrhea    ARF (acute renal failure) ([place]) 04/10/2019   Acute kidney injury ([place]) 01/22/2019   History of stroke 01/22/2019   Asthma 01/22/2019   Gastroenteritis 01/22/2019   Stroke ([place]) 06/14/2018   Cirrhosis of liver without ascites ([place]) 04/21/2017   AKI (acute kidney injury) ([place]) 07/30/2016   Anemia 11/25/2015   Abnormal liver function    Coffee ground emesis    Reflux  esophagitis    Sinus tachycardia ([place]) 10/10/2014   Hypokalemia 10/10/2014   Seizure disorder ([place]) 10/10/2014   Alcohol abuse 09/05/2012   Odynophagia 09/05/2012   GERD (gastroesophageal reflux disease) 09/05/2012   Abdominal pain 09/05/2012   FX CLOSED TIBIA NOS 07/27/2007   STROKE-With also h/o Aneurysm clipping? 07/25/2007   Seizures ([place]) 07/25/2007    Past Surgical History:  Procedure Laterality Date   ABDOMINAL HYSTERECTOMY  2004   complete   BRAIN SURGERY     age 58 stroke   COLONOSCOPY     COLONOSCOPY WITH PROPOFOL  10/03/2012   Dr. [name]: moderate diverticulosis, small internal hemorrhoids, TUBULAR ADENOMA. Next colonoscopy 2023 per Dr. [name].   ESOPHAGOGASTRODUODENOSCOPY N/A 10/18/2014   Dr. Gala Romney during inpatient hospitalization: severe exudative/erosive reflux esophagitis as source of trivial upper GI bleed. No varices    ESOPHAGOGASTRODUODENOSCOPY (EGD) WITH PROPOFOL  10/03/2012   Dr. [name]: stricture at [place] junction s/p dilation, mild gastritis negative H.pylori    ESOPHAGOGASTRODUODENOSCOPY (EGD) WITH PROPOFOL N/A 12/27/2017   Grade D esophagitis. Medium-sized hiatal hernia, normal duodenum   right tib-fib fracture  2008   SAVORY DILATION  10/03/2012   Procedure: SAVORY DILATION;  Surgeon: Danie Binder, MD;  Location: [facility];  Service: Endoscopy;  Laterality: N/A;  started at 1303,  dilated 12.8-16mm     OB History  Gravida  4   Para  3   Term  3   Preterm      AB  1   Living  3      SAB  1   IAB      Ectopic      Multiple      Live Births              Family History  Problem Relation Age of Onset   Liver disease Sister        etoh   Colon cancer Neg Hx     Social History   Tobacco Use   Smoking status: Former    Packs/day: 0.04    Years: 9.00    Pack years: 0.36    Types: Cigarettes    Quit date: 07/28/2014    Years since quitting: 6.7   Smokeless tobacco: Never  Vaping Use   Vaping Use: Never used  Substance Use  Topics   Alcohol use: Yes    Alcohol/week: 35.0 standard drinks    Types: 35 Cans of beer per week    Comment: Patient reports she drinks "a lot" of beer each day; 01/07/21 "1 or 2 at night"   Drug use: No    Home Medications Prior to Admission medications   Medication Sig Start Date End Date Taking? Authorizing Provider  albuterol (PROVENTIL HFA;VENTOLIN HFA) 108 (90 BASE) MCG/ACT inhaler Inhale 2 puffs into the lungs every 6 (six) hours as needed for wheezing or shortness of breath.     [provider]  ENSURE PLUS (ENSURE PLUS) LIQD Take 237 mLs by mouth 3 (three) times daily between meals.    [provider]  folic acid (FOLVITE) 568 MCG tablet Take 400 mcg by mouth daily.    [provider]  HYDROcodone-acetaminophen (NORCO/VICODIN) 5-325 MG tablet One tablet every six hours for pain.  Limit 7 days. 03/24/21   Sanjuana Kava, MD  linaclotide [facility]) 290 MCG CAPS capsule Take 1 capsule (290 mcg total) by mouth daily before breakfast. 09/26/20   [name], PA-C  lisinopril (ZESTRIL) 40 MG tablet Take 1 tablet (40 mg total) by mouth daily. 10/09/19   Roxan Hockey, MD  pantoprazole (PROTONIX) 40 MG tablet Take 1 tablet (40 mg total) by mouth 2 (two) times daily before a meal. 11/05/19   Gosrani, Nimish C, MD  PHENobarbital (LUMINAL) 32.4 MG tablet Take 32.4 mg by mouth 2 (two) times daily. 07/13/19   [provider]  phenytoin (DILANTIN) 100 MG ER capsule Take 100 mg by mouth 3 (three) times daily.    [provider]  potassium chloride SA (KLOR-CON) 20 MEQ tablet Take 20 mEq by mouth 3 (three) times daily. 08/25/20   [provider]  solifenacin (VESICARE) 5 MG tablet Take 5 mg by mouth daily. 03/11/20   [provider]    Allergies    Patient has no known allergies.  Review of Systems   Review of Systems  Constitutional:  Negative for chills, fatigue and fever.  HENT:  Negative for trouble swallowing.   Respiratory:   Negative for shortness of breath.   Cardiovascular:  Negative for chest pain.  Gastrointestinal:  Positive for diarrhea. Negative for abdominal pain, blood in stool, nausea and vomiting.  Genitourinary:  Negative for dysuria, flank pain and hematuria.  Musculoskeletal:  Negative for arthralgias, back pain and myalgias.  Skin:  Negative for rash.  Neurological:  Negative for dizziness, seizures, syncope, weakness, numbness and  headaches.  Hematological:  Does not bruise/bleed easily.   Physical Exam Updated Vital Signs BP 138/69   Pulse 84   Temp 98.4 F (36.9 C) (Oral)   Resp 16   Ht 5' 4"  (1.626 m)   Wt 70.8 kg   SpO2 100%   BMI 26.79 kg/m   Physical Exam Vitals and nursing note reviewed.  Constitutional:      Appearance: Normal appearance. She is not ill-appearing.  Eyes:     Conjunctiva/sclera: Conjunctivae normal.  Cardiovascular:     Rate and Rhythm: Normal rate and regular rhythm.     Pulses: Normal pulses.  Pulmonary:     Effort: Pulmonary effort is normal. No respiratory distress.     Breath sounds: Normal breath sounds.  Abdominal:     Palpations: Abdomen is soft.     Tenderness: There is no abdominal tenderness. There is no guarding or rebound.  Musculoskeletal:        General: Normal range of motion.     Cervical back: Normal range of motion.     Right lower leg: No edema.  Skin:    General: Skin is warm.     Capillary Refill: Capillary refill takes less than 2 seconds.     Findings: No erythema or rash.  Neurological:     Mental Status: She is alert.     Sensory: No sensory deficit.     Comments: Contracture of right hand and right-sided weakness.  History of stroke with right-sided hemiparesis at baseline.    ED Results / Procedures / Treatments   Labs (all labs ordered are listed, but only abnormal results are displayed) Labs Reviewed  CBC WITH DIFFERENTIAL/PLATELET - Abnormal; Notable for the following components:      Result Value   WBC 3.7  (*)    RBC 3.40 (*)    Hemoglobin 9.1 (*)    HCT 28.0 (*)    RDW 16.5 (*)    Platelets 131 (*)    All other components within normal limits  COMPREHENSIVE METABOLIC PANEL - Abnormal; Notable for the following components:   Sodium 128 (*)    CO2 21 (*)    Glucose, Bld 118 (*)    All other components within normal limits    EKG None  Radiology No results found.  Procedures Procedures   Medications Ordered in ED Medications  sodium chloride 0.9 % bolus 500 mL (500 mLs Intravenous New Bag/Given 04/09/21 1023)    ED Course  I have reviewed the triage vital signs and the nursing notes.  Pertinent labs & imaging results that were available during my care of the patient were reviewed by me and considered in my medical decision making (see chart for details).    MDM Rules/Calculators/A&P                          Patient here under advisement of PCP for evaluation/treatment of hyponatremia.  States she was seen by PCP last week and had blood work done.  Review of medical records, last blood work was 02/22/2021 sodium then 132, potassium was 2.8.  No recent lab values for comparison.  1032  On exam, patient well-appearing, alert, no mental status changes, no recent seizure. denies any symptoms at present.  Plan will include recheck of labs and administer IV NS.  she may also need IV potassium, but c-Met results pending at this time.  Labs interpreted by me, sodium today 128, potassium unremarkable.  Patient is leukopenic, history of same.  Hemoglobin at/near baseline.  Patient remains asymptomatic on recheck, IV normal saline completed.  Discussed findings with Dr. Roderic Palau.  Patient appears appropriate for discharge home at this time.  She is agreeable to outpatient follow-up.  Return precautions were discussed.   Final Clinical Impression(s) / ED Diagnoses Final diagnoses:  Hyponatremia    Rx / DC Orders ED Discharge Orders     None        Kem Parkinson, PA-C 04/09/21  1235    Milton Ferguson, MD 04/13/21 [phone]

## 2021-04-09 NOTE — Discharge Instructions (Addendum)
Follow-up with your primary care provider for recheck of your sodium level next week.  Return to the emergency department for any new or worsening symptoms.

## 2021-04-09 NOTE — ED Notes (Signed)
Pt's Aid Mrs. Hairston made aware the pt will be discharged.

## 2021-04-09 NOTE — ED Triage Notes (Signed)
Pt to the ED by POV referred by Dr. Legrand Rams for low sodium with hx of the same resulting in seizure.

## 2021-05-05 ENCOUNTER — Ambulatory Visit (INDEPENDENT_AMBULATORY_CARE_PROVIDER_SITE_OTHER): Payer: Medicare Other | Admitting: Orthopaedic Surgery

## 2021-05-05 ENCOUNTER — Encounter: Payer: Self-pay | Admitting: Orthopaedic Surgery

## 2021-05-05 ENCOUNTER — Other Ambulatory Visit: Payer: Self-pay

## 2021-05-05 DIAGNOSIS — R531 Weakness: Secondary | ICD-10-CM

## 2021-05-05 DIAGNOSIS — G8929 Other chronic pain: Secondary | ICD-10-CM

## 2021-05-05 DIAGNOSIS — M25561 Pain in right knee: Secondary | ICD-10-CM | POA: Diagnosis not present

## 2021-05-05 MED ORDER — HYDROCODONE-ACETAMINOPHEN 5-325 MG PO TABS: ORAL_TABLET | ORAL | 0 refills | Status: DC

## 2021-05-05 NOTE — Progress Notes (Signed)
PROCEDURE NOTE:  The patient requests injections of the right knee , verbal consent was obtained.  The right knee was prepped appropriately after time out was performed.   Sterile technique was observed and injection of 1 cc of Celestone 6mg  with several cc's of plain xylocaine. Anesthesia was provided by ethyl chloride and a 20-gauge needle was used to inject the knee area. The injection was tolerated well.  A band aid dressing was applied.  The patient was advised to apply ice later today and tomorrow to the injection sight as needed.   Return in six weeks.  I have reviewed the [place] web site prior to prescribing narcotic medicine for this patient.  Electronically Signed Sanjuana Kava, MD 7/19/20229:44 AM

## 2021-05-25 ENCOUNTER — Emergency Department (HOSPITAL_COMMUNITY): Payer: Medicare Other

## 2021-05-25 ENCOUNTER — Encounter (HOSPITAL_COMMUNITY): Payer: Self-pay

## 2021-05-25 ENCOUNTER — Other Ambulatory Visit: Payer: Self-pay

## 2021-05-25 ENCOUNTER — Emergency Department (HOSPITAL_COMMUNITY)
Admission: EM | Admit: 2021-05-25 | Discharge: 2021-05-25 | Disposition: A | Discharge status: Home / Self Care | Payer: Medicare Other | Source: Home / Self Care | Attending: Emergency Medicine | Admitting: Emergency Medicine

## 2021-05-25 DIAGNOSIS — E872 Acidosis, unspecified: Secondary | ICD-10-CM

## 2021-05-25 DIAGNOSIS — Z79899 Other long term (current) drug therapy: Secondary | ICD-10-CM | POA: Insufficient documentation

## 2021-05-25 DIAGNOSIS — Y905 Blood alcohol level of 100-119 mg/100 ml: Secondary | ICD-10-CM | POA: Insufficient documentation

## 2021-05-25 DIAGNOSIS — J45909 Unspecified asthma, uncomplicated: Secondary | ICD-10-CM | POA: Insufficient documentation

## 2021-05-25 DIAGNOSIS — I10 Essential (primary) hypertension: Secondary | ICD-10-CM | POA: Insufficient documentation

## 2021-05-25 DIAGNOSIS — R1084 Generalized abdominal pain: Secondary | ICD-10-CM | POA: Insufficient documentation

## 2021-05-25 DIAGNOSIS — Z87891 Personal history of nicotine dependence: Secondary | ICD-10-CM | POA: Insufficient documentation

## 2021-05-25 DIAGNOSIS — A419 Sepsis, unspecified organism: Secondary | ICD-10-CM | POA: Diagnosis not present

## 2021-05-25 LAB — COMPREHENSIVE METABOLIC PANEL
ALT: 35 U/L (ref 0–44)
AST: 64 U/L — ABNORMAL HIGH (ref 15–41)
Albumin: 3.8 g/dL (ref 3.5–5.0)
Alkaline Phosphatase: 116 U/L (ref 38–126)
Anion gap: 11 (ref 5–15)
BUN: <5 mg/dL — ABNORMAL LOW (ref 8–23)
CO2: 22 mmol/L (ref 22–32)
Calcium: 8.7 mg/dL — ABNORMAL LOW (ref 8.9–10.3)
Chloride: 94 mmol/L — ABNORMAL LOW (ref 98–111)
Creatinine, Ser: 0.62 mg/dL (ref 0.44–1.00)
GFR, Estimated: >60 mL/min (ref >60)
Glucose, Bld: 93 mg/dL (ref 70–99)
Potassium: 3.9 mmol/L (ref 3.5–5.1)
Sodium: 127 mmol/L — ABNORMAL LOW (ref 135–145)
Total Bilirubin: 0.8 mg/dL (ref 0.3–1.2)
Total Protein: 7.6 g/dL (ref 6.5–8.1)

## 2021-05-25 LAB — CBC WITH DIFFERENTIAL/PLATELET
Abs Immature Granulocytes: 0 10*3/uL (ref 0.00–0.07)
Basophils Absolute: 0 10*3/uL (ref 0.0–0.1)
Basophils Relative: 1 %
Eosinophils Absolute: 0 10*3/uL (ref 0.0–0.5)
Eosinophils Relative: 1 %
HCT: 29.7 % — ABNORMAL LOW (ref 36.0–46.0)
Hemoglobin: 9.8 g/dL — ABNORMAL LOW (ref 12.0–15.0)
Immature Granulocytes: 0 %
Lymphocytes Relative: 39 %
Lymphs Abs: 1.3 10*3/uL (ref 0.7–4.0)
MCH: 27.7 pg (ref 26.0–34.0)
MCHC: 33 g/dL (ref 30.0–36.0)
MCV: 83.9 fL (ref 80.0–100.0)
Monocytes Absolute: 0.4 10*3/uL (ref 0.1–1.0)
Monocytes Relative: 12 %
Neutro Abs: 1.7 10*3/uL (ref 1.7–7.7)
Neutrophils Relative %: 47 %
Platelets: 114 10*3/uL — ABNORMAL LOW (ref 150–400)
RBC: 3.54 MIL/uL — ABNORMAL LOW (ref 3.87–5.11)
RDW: 17.3 % — ABNORMAL HIGH (ref 11.5–15.5)
WBC: 3.5 10*3/uL — ABNORMAL LOW (ref 4.0–10.5)
nRBC: 0 % (ref 0.0–0.2)

## 2021-05-25 LAB — LIPASE, BLOOD: Lipase: 24 U/L (ref 11–51)

## 2021-05-25 LAB — URINALYSIS, ROUTINE W REFLEX MICROSCOPIC
Bilirubin Urine: NEGATIVE
Glucose, UA: NEGATIVE mg/dL
Hgb urine dipstick: NEGATIVE
Ketones, ur: NEGATIVE mg/dL
Nitrite: NEGATIVE
Protein, ur: NEGATIVE mg/dL
Specific Gravity, Urine: 1.001 — ABNORMAL LOW (ref 1.005–1.030)
pH: 6 (ref 5.0–8.0)

## 2021-05-25 LAB — PROTIME-INR
INR: 1.2 (ref 0.8–1.2)
Prothrombin Time: 14.9 seconds (ref 11.4–15.2)

## 2021-05-25 LAB — LACTIC ACID, PLASMA
Lactic Acid, Venous: 3.8 mmol/L (ref 0.5–1.9)
Lactic Acid, Venous: 4.3 mmol/L (ref 0.5–1.9)
Lactic Acid, Venous: 2.3 mmol/L (ref 0.5–1.9)

## 2021-05-25 LAB — POC OCCULT BLOOD, ED: Fecal Occult Bld: NEGATIVE

## 2021-05-25 LAB — ETHANOL: Alcohol, Ethyl (B): 107 mg/dL — ABNORMAL HIGH (ref <10)

## 2021-05-25 MED ORDER — IOHEXOL 350 MG/ML SOLN
80.0000 mL | Freq: Once | INTRAVENOUS | Status: AC | PRN
  Administered 2021-05-25: 80 mL via INTRAVENOUS

## 2021-05-25 MED ORDER — SODIUM CHLORIDE 0.9 % IV BOLUS
30.0000 mL/kg | Freq: Once | INTRAVENOUS | Status: AC
  Administered 2021-05-25: 2124 mL via INTRAVENOUS

## 2021-05-25 MED ORDER — SODIUM CHLORIDE 0.9 % IV SOLN
1.0000 g | Freq: Once | INTRAVENOUS | Status: AC
  Administered 2021-05-25: 1 g via INTRAVENOUS
  Filled 2021-05-25: qty 10

## 2021-05-25 NOTE — ED Notes (Signed)
Pt placed on purewick 

## 2021-05-25 NOTE — ED Notes (Signed)
Hope hairston updated on patient's care plan and dispo. Robert brown called for ride at this time and states he would find a way to get the patient home.

## 2021-05-25 NOTE — ED Provider Notes (Signed)
Patient CT imaging is unremarkable for any acute findings.  On my repeat evaluation, patient has tenderness palpation of left lower quadrant but no significant guarding or rebound noted.  However lactic acid appears elevated.  This point she was given IV fluids and repeat lactic drawn and appears to be trending downwards.  History indicates that prior medical history of lactic acidosis as well.  Will recommend continued outpatient follow-up with her doctors within the week, recommending return for fevers worsening pain or any additional concerns.    Luna Fuse, MD 05/25/21 [phone]

## 2021-05-25 NOTE — ED Provider Notes (Signed)
[facility] EMERGENCY DEPARTMENT Provider Note   CSN: [id number] Arrival date & time: 05/25/21  0425     History Chief Complaint  Patient presents with   Abdominal Pain    [name] is a 65 y.o. female.  Patient reports that she has having abdominal pain and thinks he passed bright red blood in her stool.  Patient reports that the pain has been going on for a couple of weeks.  She thought it would go away on its own.  Pain is across the middle of the abdomen.  No vomiting.      Past Medical History:  Diagnosis Date   Alcohol abuse    Asthma    Contracture of muscle of hand    right   GERD (gastroesophageal reflux disease)    HTN (hypertension)    PUD (peptic ulcer disease)    remote   Seizures ([place])    since stroke, no recent seizures   Sleep apnea    Stroke [facility])    age 43, required brain surgery effected right side    Patient Active Problem List   Diagnosis Date Noted   Lactic acidosis 12/19/2020   Encephalopathy acute [id number]   Acute metabolic encephalopathy [id number]   Acute blood loss anemia 11/02/2019   Overweight (BMI 25.0-29.9) 11/02/2019   Gait disturbance    Rectal bleeding 10/08/2019   Hyponatremia    Diarrhea 04/11/2019   Nausea, vomiting, and diarrhea    ARF (acute renal failure) ([place]) 04/10/2019   Acute kidney injury ([place]) 01/22/2019   History of stroke 01/22/2019   Asthma 01/22/2019   Gastroenteritis 01/22/2019   Stroke ([place]) 06/14/2018   Cirrhosis of liver without ascites ([place]) 04/21/2017   AKI (acute kidney injury) ([employer]) 07/30/2016   Anemia 11/25/2015   Abnormal liver function    Coffee ground emesis    Reflux esophagitis    Sinus tachycardia ([place]) 10/10/2014   Hypokalemia 10/10/2014   Seizure disorder ([place]) 10/10/2014   Alcohol abuse 09/05/2012   Odynophagia 09/05/2012   GERD (gastroesophageal reflux disease) 09/05/2012   Abdominal pain 09/05/2012   FX CLOSED TIBIA NOS 07/27/2007   STROKE-With also h/o Aneurysm  clipping? 07/25/2007   Seizures ([place]) 07/25/2007    Past Surgical History:  Procedure Laterality Date   ABDOMINAL HYSTERECTOMY  2004   complete   BRAIN SURGERY     age 33 stroke   COLONOSCOPY     COLONOSCOPY WITH PROPOFOL  10/03/2012   Dr. [name]: moderate diverticulosis, small internal hemorrhoids, TUBULAR ADENOMA. Next colonoscopy 2023 per Dr. [name].   ESOPHAGOGASTRODUODENOSCOPY N/A 10/18/2014   Dr. Gala Romney during inpatient hospitalization: severe exudative/erosive reflux esophagitis as source of trivial upper GI bleed. No varices    ESOPHAGOGASTRODUODENOSCOPY (EGD) WITH PROPOFOL  10/03/2012   Dr. [name]: stricture at [place] junction s/p dilation, mild gastritis negative H.pylori    ESOPHAGOGASTRODUODENOSCOPY (EGD) WITH PROPOFOL N/A 12/27/2017   Grade D esophagitis. Medium-sized hiatal hernia, normal duodenum   right tib-fib fracture  2008   SAVORY DILATION  10/03/2012   Procedure: SAVORY DILATION;  Surgeon: Danie Binder, MD;  Location: [facility];  Service: Endoscopy;  Laterality: N/A;  started at 1303,  dilated 12.8-16mm     OB History     Gravida  4   Para  3   Term  3   Preterm      AB  1   Living  3      SAB  1   IAB  Ectopic      Multiple      Live Births              Family History  Problem Relation Age of Onset   Liver disease Sister        etoh   Colon cancer Neg Hx     Social History   Tobacco Use   Smoking status: Former    Packs/day: 0.04    Years: 9.00    Pack years: 0.36    Types: Cigarettes    Quit date: 07/28/2014    Years since quitting: 6.8   Smokeless tobacco: Never  Vaping Use   Vaping Use: Never used  Substance Use Topics   Alcohol use: Yes    Alcohol/week: 35.0 standard drinks    Types: 35 Cans of beer per week    Comment: Patient reports she drinks "a lot" of beer each day; 01/07/21 "1 or 2 at night"   Drug use: No    Home Medications Prior to Admission medications   Medication Sig Start Date End Date Taking?  Authorizing Provider  albuterol (PROVENTIL HFA;VENTOLIN HFA) 108 (90 BASE) MCG/ACT inhaler Inhale 2 puffs into the lungs every 6 (six) hours as needed for wheezing or shortness of breath.     [provider]  ENSURE PLUS (ENSURE PLUS) LIQD Take 237 mLs by mouth 3 (three) times daily between meals.    [provider]  folic acid (FOLVITE) A999333 MCG tablet Take 400 mcg by mouth daily.    [provider]  HYDROcodone-acetaminophen (NORCO/VICODIN) 5-325 MG tablet One tablet every six hours for pain.  Limit 7 days. 05/05/21   Sanjuana Kava, MD  linaclotide [facility]) 290 MCG CAPS capsule Take 1 capsule (290 mcg total) by mouth daily before breakfast. 09/26/20   [name], PA-C  lisinopril (ZESTRIL) 40 MG tablet Take 1 tablet (40 mg total) by mouth daily. 10/09/19   Roxan Hockey, MD  pantoprazole (PROTONIX) 40 MG tablet Take 1 tablet (40 mg total) by mouth 2 (two) times daily before a meal. 11/05/19   Gosrani, Nimish C, MD  PHENobarbital (LUMINAL) 32.4 MG tablet Take 32.4 mg by mouth 2 (two) times daily. 07/13/19   [provider]  phenytoin (DILANTIN) 100 MG ER capsule Take 100 mg by mouth 3 (three) times daily.    [provider]  potassium chloride SA (KLOR-CON) 20 MEQ tablet Take 20 mEq by mouth 3 (three) times daily. 08/25/20   [provider]  solifenacin (VESICARE) 5 MG tablet Take 5 mg by mouth daily. 03/11/20   [provider]    Allergies    Patient has no known allergies.  Review of Systems   Review of Systems  Gastrointestinal:  Positive for abdominal pain and blood in stool.  All other systems reviewed and are negative.  Physical Exam Updated Vital Signs BP (!) 141/63 (BP Location: Left Arm)   Pulse 97   Temp 97.8 F (36.6 C) (Oral)   Resp 16   Ht '5\' 4"'$  (1.626 m)   Wt 70.8 kg   SpO2 98%   BMI 26.79 kg/m   Physical Exam Vitals and nursing note reviewed.  Constitutional:      General: She is not in acute  distress.    Appearance: Normal appearance. She is well-developed.  HENT:     Head: Normocephalic and atraumatic.     Right Ear: Hearing normal.     Left Ear: Hearing normal.  Nose: Nose normal.  Eyes:     Conjunctiva/sclera: Conjunctivae normal.     Pupils: Pupils are equal, round, and reactive to light.  Cardiovascular:     Rate and Rhythm: Regular rhythm.     Heart sounds: S1 normal and S2 normal. No murmur heard.   No friction rub. No gallop.  Pulmonary:     Effort: Pulmonary effort is normal. No respiratory distress.     Breath sounds: Normal breath sounds.  Chest:     Chest wall: No tenderness.  Abdominal:     General: Bowel sounds are normal.     Palpations: Abdomen is soft.     Tenderness: There is generalized abdominal tenderness. There is no guarding or rebound. Negative signs include Murphy's sign and McBurney's sign.     Hernia: No hernia is present.  Musculoskeletal:        General: Normal range of motion.     Cervical back: Normal range of motion and neck supple.  Skin:    General: Skin is warm and dry.     Findings: No rash.  Neurological:     Mental Status: She is alert and oriented to person, place, and time.     GCS: GCS eye subscore is 4. GCS verbal subscore is 5. GCS motor subscore is 6.     Cranial Nerves: No cranial nerve deficit.     Sensory: No sensory deficit.     Coordination: Coordination normal.  Psychiatric:        Speech: Speech normal.        Behavior: Behavior normal.        Thought Content: Thought content normal.    ED Results / Procedures / Treatments   Labs (all labs ordered are listed, but only abnormal results are displayed) Labs Reviewed  CBC WITH DIFFERENTIAL/PLATELET  COMPREHENSIVE METABOLIC PANEL  PROTIME-INR  LACTIC ACID, PLASMA  LIPASE, BLOOD  ETHANOL  POC OCCULT BLOOD, ED    EKG None  Radiology No results found.  Procedures Procedures   Medications Ordered in ED Medications - No data to display  ED  Course  I have reviewed the triage vital signs and the nursing notes.  Pertinent labs & imaging results that were available during my care of the patient were reviewed by me and considered in my medical decision making (see chart for details).    MDM Rules/Calculators/A&P                           Patient presents to the emergency department for evaluation of abdominal pain.  She reports that symptoms have been ongoing for 2 weeks.  Patient is an alcoholic.  She admits to drinking daily.  Abdominal exam does not suggest an acute surgical process.  She reports that she had bright red blood per rectum, however, rectal exam revealed brown stool that was heme-negative.  Doubt acute GI bleed.  Will check labs.  Anticipate discharge if no significant abnormality.  Will sign out to oncoming ER physician to follow-up on work-up.  Final Clinical Impression(s) / ED Diagnoses Final diagnoses:  Generalized abdominal pain    Rx / DC Orders ED Discharge Orders     None        Jaycie Kregel, Gwenyth Allegra, MD 05/25/21 [phone]

## 2021-05-25 NOTE — ED Triage Notes (Signed)
Pt arrived via [facility] after calling them c/o of abdominal pain X 2 weeks and possible bright red blood in stool. Pt endorses drinking multiple Budlights on a daily basis with her roommate. Pt endorses having at least 6 beers tonight. Pt A+O X 4, ambulatory in Triage.

## 2021-05-25 NOTE — Discharge Instructions (Addendum)
Call your primary care doctor or specialist as discussed in the next 2-3 days.   Return immediately back to the ER if:  Your symptoms worsen within the next 12-24 hours. You develop new symptoms such as new fevers, persistent vomiting, new pain, shortness of breath, or new weakness or numbness, or if you have any other concerns.  

## 2021-05-25 NOTE — ED Notes (Signed)
Patient's ride states he cannot come get patient has he has no vehicle. Safe transport called at this time for patient transport for discharge.

## 2021-05-26 ENCOUNTER — Telehealth (HOSPITAL_BASED_OUTPATIENT_CLINIC_OR_DEPARTMENT_OTHER): Payer: Self-pay

## 2021-05-26 LAB — BLOOD CULTURE ID PANEL (REFLEXED) - BCID2

## 2021-05-26 NOTE — Telephone Encounter (Signed)
This Probation officer was mad aware of +BC by lab, [place] MD advised to call patient and see how she was feeling, states that she thinks is was a contaminant on BC bottle, advises to speak with patient, if feeling worse, should seek follow up. Pt advises she is feeling better.

## 2021-05-27 LAB — URINE CULTURE

## 2021-05-28 ENCOUNTER — Inpatient Hospital Stay (HOSPITAL_COMMUNITY): Payer: Medicare Other

## 2021-05-28 ENCOUNTER — Inpatient Hospital Stay (HOSPITAL_COMMUNITY)
Admission: EM | Admit: 2021-05-28 | Discharge: 2021-06-03 | DRG: 872 | Disposition: A | Discharge status: Home / Self Care | Payer: Medicare Other | Attending: Internal Medicine | Admitting: Internal Medicine

## 2021-05-28 ENCOUNTER — Encounter (HOSPITAL_COMMUNITY): Payer: Self-pay | Admitting: *Deleted

## 2021-05-28 DIAGNOSIS — I69398 Other sequelae of cerebral infarction: Secondary | ICD-10-CM | POA: Diagnosis not present

## 2021-05-28 DIAGNOSIS — I639 Cerebral infarction, unspecified: Secondary | ICD-10-CM | POA: Diagnosis present

## 2021-05-28 DIAGNOSIS — Z90710 Acquired absence of both cervix and uterus: Secondary | ICD-10-CM | POA: Diagnosis not present

## 2021-05-28 DIAGNOSIS — I10 Essential (primary) hypertension: Secondary | ICD-10-CM | POA: Diagnosis present

## 2021-05-28 DIAGNOSIS — Z20822 Contact with and (suspected) exposure to covid-19: Secondary | ICD-10-CM | POA: Diagnosis present

## 2021-05-28 DIAGNOSIS — K529 Noninfective gastroenteritis and colitis, unspecified: Secondary | ICD-10-CM | POA: Diagnosis present

## 2021-05-28 DIAGNOSIS — Z79899 Other long term (current) drug therapy: Secondary | ICD-10-CM | POA: Diagnosis not present

## 2021-05-28 DIAGNOSIS — F1010 Alcohol abuse, uncomplicated: Secondary | ICD-10-CM | POA: Diagnosis present

## 2021-05-28 DIAGNOSIS — Z87891 Personal history of nicotine dependence: Secondary | ICD-10-CM

## 2021-05-28 DIAGNOSIS — E876 Hypokalemia: Secondary | ICD-10-CM | POA: Diagnosis present

## 2021-05-28 DIAGNOSIS — R197 Diarrhea, unspecified: Secondary | ICD-10-CM | POA: Diagnosis present

## 2021-05-28 DIAGNOSIS — E871 Hypo-osmolality and hyponatremia: Secondary | ICD-10-CM | POA: Diagnosis present

## 2021-05-28 DIAGNOSIS — E872 Acidosis, unspecified: Secondary | ICD-10-CM | POA: Diagnosis present

## 2021-05-28 DIAGNOSIS — A419 Sepsis, unspecified organism: Secondary | ICD-10-CM | POA: Diagnosis present

## 2021-05-28 DIAGNOSIS — R6510 Systemic inflammatory response syndrome (SIRS) of non-infectious origin without acute organ dysfunction: Secondary | ICD-10-CM

## 2021-05-28 DIAGNOSIS — K7030 Alcoholic cirrhosis of liver without ascites: Secondary | ICD-10-CM

## 2021-05-28 DIAGNOSIS — N390 Urinary tract infection, site not specified: Secondary | ICD-10-CM | POA: Diagnosis present

## 2021-05-28 DIAGNOSIS — R6520 Severe sepsis without septic shock: Secondary | ICD-10-CM | POA: Diagnosis present

## 2021-05-28 DIAGNOSIS — G40909 Epilepsy, unspecified, not intractable, without status epilepticus: Secondary | ICD-10-CM

## 2021-05-28 DIAGNOSIS — K7460 Unspecified cirrhosis of liver: Secondary | ICD-10-CM | POA: Diagnosis present

## 2021-05-28 DIAGNOSIS — R1011 Right upper quadrant pain: Secondary | ICD-10-CM

## 2021-05-28 LAB — URINALYSIS, ROUTINE W REFLEX MICROSCOPIC
Glucose, UA: NEGATIVE mg/dL
Ketones, ur: NEGATIVE mg/dL
Nitrite: NEGATIVE
Protein, ur: 30 mg/dL — AB
Specific Gravity, Urine: 1.008 (ref 1.005–1.030)
pH: 8 (ref 5.0–8.0)

## 2021-05-28 LAB — ETHANOL: Alcohol, Ethyl (B): <10 mg/dL (ref <10)

## 2021-05-28 LAB — CBC WITH DIFFERENTIAL/PLATELET
Abs Immature Granulocytes: 0.2 10*3/uL — ABNORMAL HIGH (ref 0.00–0.07)
Basophils Absolute: 0 10*3/uL (ref 0.0–0.1)
Basophils Relative: 0 %
Eosinophils Absolute: 0 10*3/uL (ref 0.0–0.5)
Eosinophils Relative: 0 %
HCT: 30.2 % — ABNORMAL LOW (ref 36.0–46.0)
Hemoglobin: 10.1 g/dL — ABNORMAL LOW (ref 12.0–15.0)
Immature Granulocytes: 2 %
Lymphocytes Relative: 4 %
Lymphs Abs: 0.4 10*3/uL — ABNORMAL LOW (ref 0.7–4.0)
MCH: 28.1 pg (ref 26.0–34.0)
MCHC: 33.4 g/dL (ref 30.0–36.0)
MCV: 83.9 fL (ref 80.0–100.0)
Monocytes Absolute: 0.5 10*3/uL (ref 0.1–1.0)
Monocytes Relative: 5 %
Neutro Abs: 9.1 10*3/uL — ABNORMAL HIGH (ref 1.7–7.7)
Neutrophils Relative %: 89 %
Platelets: 110 10*3/uL — ABNORMAL LOW (ref 150–400)
RBC: 3.6 MIL/uL — ABNORMAL LOW (ref 3.87–5.11)
RDW: 18 % — ABNORMAL HIGH (ref 11.5–15.5)
WBC: 10.2 10*3/uL (ref 4.0–10.5)
nRBC: 0 % (ref 0.0–0.2)

## 2021-05-28 LAB — COMPREHENSIVE METABOLIC PANEL
ALT: 28 U/L (ref 0–44)
AST: 40 U/L (ref 15–41)
Albumin: 3.7 g/dL (ref 3.5–5.0)
Alkaline Phosphatase: 94 U/L (ref 38–126)
Anion gap: 11 (ref 5–15)
BUN: <5 mg/dL — ABNORMAL LOW (ref 8–23)
CO2: 20 mmol/L — ABNORMAL LOW (ref 22–32)
Calcium: 8.7 mg/dL — ABNORMAL LOW (ref 8.9–10.3)
Chloride: 98 mmol/L (ref 98–111)
Creatinine, Ser: 0.87 mg/dL (ref 0.44–1.00)
GFR, Estimated: >60 mL/min (ref >60)
Glucose, Bld: 122 mg/dL — ABNORMAL HIGH (ref 70–99)
Potassium: 3.2 mmol/L — ABNORMAL LOW (ref 3.5–5.1)
Sodium: 129 mmol/L — ABNORMAL LOW (ref 135–145)
Total Bilirubin: 1.5 mg/dL — ABNORMAL HIGH (ref 0.3–1.2)
Total Protein: 7.5 g/dL (ref 6.5–8.1)

## 2021-05-28 LAB — PHENYTOIN LEVEL, TOTAL: Phenytoin Lvl: <2.5 ug/mL — ABNORMAL LOW (ref 10.0–20.0)

## 2021-05-28 LAB — RESP PANEL BY RT-PCR (FLU A&B, COVID) ARPGX2
Influenza A by PCR: NEGATIVE
Influenza B by PCR: NEGATIVE
SARS Coronavirus 2 by RT PCR: NEGATIVE

## 2021-05-28 LAB — LIPASE, BLOOD: Lipase: 22 U/L (ref 11–51)

## 2021-05-28 LAB — PHOSPHORUS: Phosphorus: 3 mg/dL (ref 2.5–4.6)

## 2021-05-28 LAB — MAGNESIUM: Magnesium: 1.4 mg/dL — ABNORMAL LOW (ref 1.7–2.4)

## 2021-05-28 LAB — LACTIC ACID, PLASMA
Lactic Acid, Venous: 3.3 mmol/L (ref 0.5–1.9)
Lactic Acid, Venous: 2.1 mmol/L (ref 0.5–1.9)

## 2021-05-28 MED ORDER — LORAZEPAM 2 MG/ML IJ SOLN: 0.0000 mg | Freq: Four times a day (QID) | INTRAMUSCULAR | Status: DC

## 2021-05-28 MED ORDER — CEFTRIAXONE SODIUM 2 G IJ SOLR
2.0000 g | INTRAMUSCULAR | Status: DC
  Administered 2021-05-29 – 2021-06-03 (×6): 2 g via INTRAVENOUS
  Filled 2021-05-28 (×5): qty 20

## 2021-05-28 MED ORDER — THIAMINE HCL 100 MG PO TABS: 100.0000 mg | ORAL_TABLET | Freq: Every day | ORAL | Status: DC

## 2021-05-28 MED ORDER — ACETAMINOPHEN 325 MG PO TABS
650.0000 mg | ORAL_TABLET | Freq: Four times a day (QID) | ORAL | Status: DC | PRN
  Administered 2021-05-29: 650 mg via ORAL
  Filled 2021-05-28: qty 2

## 2021-05-28 MED ORDER — PANTOPRAZOLE SODIUM 40 MG IV SOLR
40.0000 mg | Freq: Once | INTRAVENOUS | Status: AC
  Administered 2021-05-28: 40 mg via INTRAVENOUS
  Filled 2021-05-28: qty 40

## 2021-05-28 MED ORDER — LORAZEPAM 2 MG/ML IJ SOLN: 0.0000 mg | Freq: Four times a day (QID) | INTRAMUSCULAR | Status: AC

## 2021-05-28 MED ORDER — LORAZEPAM 2 MG/ML IJ SOLN: 1.0000 mg | INTRAMUSCULAR | Status: AC | PRN

## 2021-05-28 MED ORDER — LORAZEPAM 1 MG PO TABS: 0.0000 mg | ORAL_TABLET | Freq: Two times a day (BID) | ORAL | Status: DC

## 2021-05-28 MED ORDER — PANTOPRAZOLE SODIUM 40 MG IV SOLR
40.0000 mg | INTRAVENOUS | Status: DC
  Administered 2021-05-30 – 2021-06-02 (×4): 40 mg via INTRAVENOUS
  Filled 2021-05-28 (×4): qty 40

## 2021-05-28 MED ORDER — LORAZEPAM 1 MG PO TABS: 1.0000 mg | ORAL_TABLET | ORAL | Status: AC | PRN

## 2021-05-28 MED ORDER — THIAMINE HCL 100 MG/ML IJ SOLN
100.0000 mg | Freq: Every day | INTRAMUSCULAR | Status: DC
  Administered 2021-05-28: 100 mg via INTRAVENOUS
  Filled 2021-05-28: qty 2

## 2021-05-28 MED ORDER — ALUM & MAG HYDROXIDE-SIMETH 200-200-20 MG/5ML PO SUSP: 30.0000 mL | Freq: Once | ORAL | Status: DC

## 2021-05-28 MED ORDER — FOLIC ACID 1 MG PO TABS
1.0000 mg | ORAL_TABLET | Freq: Every day | ORAL | Status: DC
  Administered 2021-05-29 – 2021-06-03 (×6): 1 mg via ORAL
  Filled 2021-05-28 (×6): qty 1

## 2021-05-28 MED ORDER — SODIUM CHLORIDE 0.9 % IV BOLUS
1000.0000 mL | Freq: Once | INTRAVENOUS | Status: AC
Start: 2021-05-28 — End: 2021-05-28
  Administered 2021-05-28: 1000 mL via INTRAVENOUS

## 2021-05-28 MED ORDER — LORAZEPAM 1 MG PO TABS: 0.0000 mg | ORAL_TABLET | Freq: Four times a day (QID) | ORAL | Status: DC

## 2021-05-28 MED ORDER — ACETAMINOPHEN 325 MG PO TABS
650.0000 mg | ORAL_TABLET | Freq: Once | ORAL | Status: AC
  Administered 2021-05-28: 650 mg via ORAL
  Filled 2021-05-28: qty 2

## 2021-05-28 MED ORDER — MORPHINE SULFATE (PF) 2 MG/ML IV SOLN
2.0000 mg | INTRAVENOUS | Status: DC | PRN
Start: 2021-05-28 — End: 2021-06-03

## 2021-05-28 MED ORDER — LIDOCAINE VISCOUS HCL 2 % MT SOLN: 15.0000 mL | Freq: Once | OROMUCOSAL | Status: DC

## 2021-05-28 MED ORDER — ACETAMINOPHEN 500 MG PO TABS
1000.0000 mg | ORAL_TABLET | Freq: Once | ORAL | Status: AC
  Administered 2021-05-28: 1000 mg via ORAL
  Filled 2021-05-28: qty 2

## 2021-05-28 MED ORDER — SODIUM CHLORIDE 0.9 % IV BOLUS
1000.0000 mL | Freq: Once | INTRAVENOUS | Status: AC
  Administered 2021-05-28: 1000 mL via INTRAVENOUS

## 2021-05-28 MED ORDER — ACETAMINOPHEN 650 MG RE SUPP: 650.0000 mg | Freq: Four times a day (QID) | RECTAL | Status: DC | PRN

## 2021-05-28 MED ORDER — THIAMINE HCL 100 MG PO TABS
100.0000 mg | ORAL_TABLET | Freq: Every day | ORAL | Status: DC
  Administered 2021-05-29 – 2021-06-03 (×6): 100 mg via ORAL
  Filled 2021-05-28 (×6): qty 1

## 2021-05-28 MED ORDER — LORAZEPAM 2 MG/ML IJ SOLN: 0.0000 mg | Freq: Two times a day (BID) | INTRAMUSCULAR | Status: AC

## 2021-05-28 MED ORDER — POTASSIUM CHLORIDE IN NACL 40-0.9 MEQ/L-% IV SOLN: INTRAVENOUS | Status: AC

## 2021-05-28 MED ORDER — LORAZEPAM 2 MG/ML IJ SOLN: 0.0000 mg | Freq: Two times a day (BID) | INTRAMUSCULAR | Status: DC

## 2021-05-28 MED ORDER — IOHEXOL 350 MG/ML SOLN
100.0000 mL | Freq: Once | INTRAVENOUS | Status: AC | PRN
  Administered 2021-05-29: 100 mL via INTRAVENOUS

## 2021-05-28 MED ORDER — ENOXAPARIN SODIUM 40 MG/0.4ML IJ SOSY
40.0000 mg | PREFILLED_SYRINGE | INTRAMUSCULAR | Status: DC
  Administered 2021-05-29 – 2021-06-03 (×6): 40 mg via SUBCUTANEOUS
  Filled 2021-05-28 (×5): qty 0.4

## 2021-05-28 MED ORDER — PHENYTOIN SODIUM EXTENDED 100 MG PO CAPS
100.0000 mg | ORAL_CAPSULE | Freq: Three times a day (TID) | ORAL | Status: DC
  Administered 2021-05-29 – 2021-06-03 (×16): 100 mg via ORAL
  Filled 2021-05-28 (×17): qty 1

## 2021-05-28 MED ORDER — MORPHINE SULFATE (PF) 4 MG/ML IV SOLN
4.0000 mg | Freq: Once | INTRAVENOUS | Status: AC
  Administered 2021-05-28: 4 mg via INTRAVENOUS
  Filled 2021-05-28: qty 1

## 2021-05-28 MED ORDER — ADULT MULTIVITAMIN W/MINERALS CH
1.0000 | ORAL_TABLET | Freq: Every day | ORAL | Status: DC
  Administered 2021-05-29 – 2021-06-03 (×6): 1 via ORAL
  Filled 2021-05-28 (×6): qty 1

## 2021-05-28 MED ORDER — SODIUM CHLORIDE 0.9 % IV BOLUS
500.0000 mL | Freq: Once | INTRAVENOUS | Status: AC
  Administered 2021-05-29: 500 mL via INTRAVENOUS

## 2021-05-28 MED ORDER — SODIUM CHLORIDE 0.9 % IV SOLN: 1.0000 g | INTRAVENOUS | Status: DC

## 2021-05-28 MED ORDER — PHENOBARBITAL 32.4 MG PO TABS
32.4000 mg | ORAL_TABLET | Freq: Two times a day (BID) | ORAL | Status: DC
  Administered 2021-05-29 – 2021-06-03 (×11): 32.4 mg via ORAL
  Filled 2021-05-28 (×11): qty 1

## 2021-05-28 MED ORDER — THIAMINE HCL 100 MG/ML IJ SOLN: 100.0000 mg | Freq: Every day | INTRAMUSCULAR | Status: DC

## 2021-05-28 NOTE — ED Triage Notes (Signed)
Abdominal pain x 4 days 

## 2021-05-28 NOTE — H&P (Addendum)
History and Physical    [name] [id number] DOB: 09/09/1956 DOA: 05/28/2021  PCP: [name], MD   Patient coming from: Home  I have personally briefly reviewed patient's old medical records in [place]  Chief Complaint: Abdominal pain  HPI: [name] is a 65 y.o. female with medical history significant for alcohol abuse, liver cirrhosis, stroke with right-sided deficits, seizure disorder. History is challenging due to stroke patient has abnormal speech.  Patient presented to the ED with complaints of abdominal pain of about 1 week duration.  She reports dry heaving, and multiple loose stools also.  She reports abdominal pain is worse with food.  She reports pain with urination, and change in color of her urine over the past 3 days.  Patient was in the ED 3 days ago 8/8 for abdominal pain, abdominal CT was unrevealing.  Chest x-ray unremarkable.  Blood and urine cultures were obtained .  UA showed small leukocytes, rare bacteria, subsequent urine culture grew multiple species recollection suggested.  ED Course: T-max 102.9, tachycardic to 123, respiratory rate 18-29, blood pressure systolic [id number].  O2 sats greater than 98% on room air.  WBC 10.2.  Sodium 129.  Potassium 3.2.  Lipase normal 22.  Lactic acidosis of 3.3 >> 2.1.  Blood alcohol level less than 10, 3 days ago her blood alcohol level was 107.  2 L bolus given in ED.  Hospitalist to admit.   Review of Systems: As per HPI all other systems reviewed and negative.  Past Medical History:  Diagnosis Date   Alcohol abuse    Asthma    Contracture of muscle of hand    right   GERD (gastroesophageal reflux disease)    HTN (hypertension)    PUD (peptic ulcer disease)    remote   Seizures ([place])    since stroke, no recent seizures   Sleep apnea    Stroke [facility])    age 19, required brain surgery effected right side    Past Surgical History:  Procedure Laterality Date   ABDOMINAL HYSTERECTOMY  2004    complete   BRAIN SURGERY     age 29 stroke   COLONOSCOPY     COLONOSCOPY WITH PROPOFOL  10/03/2012   Dr. [name]: moderate diverticulosis, small internal hemorrhoids, TUBULAR ADENOMA. Next colonoscopy 2023 per Dr. [name].   ESOPHAGOGASTRODUODENOSCOPY N/A 10/18/2014   Dr. Gala Romney during inpatient hospitalization: severe exudative/erosive reflux esophagitis as source of trivial upper GI bleed. No varices    ESOPHAGOGASTRODUODENOSCOPY (EGD) WITH PROPOFOL  10/03/2012   Dr. [name]: stricture at [place] junction s/p dilation, mild gastritis negative H.pylori    ESOPHAGOGASTRODUODENOSCOPY (EGD) WITH PROPOFOL N/A 12/27/2017   Grade D esophagitis. Medium-sized hiatal hernia, normal duodenum   right tib-fib fracture  2008   SAVORY DILATION  10/03/2012   Procedure: SAVORY DILATION;  Surgeon: Danie Binder, MD;  Location: [facility];  Service: Endoscopy;  Laterality: N/A;  started at 1303,  dilated 12.8-16mm     reports that she quit smoking about 6 years ago. Her smoking use included cigarettes. She has a 0.36 pack-year smoking history. She has never used smokeless tobacco. She reports current alcohol use of about 35.0 standard drinks per week. She reports that she does not use drugs.  No Active Allergies  Family History  Problem Relation Age of Onset   Liver disease Sister        etoh   Colon cancer Neg Hx     Prior to Admission  medications   Medication Sig Start Date End Date Taking? Authorizing Provider  albuterol (PROVENTIL HFA;VENTOLIN HFA) 108 (90 BASE) MCG/ACT inhaler Inhale 2 puffs into the lungs every 6 (six) hours as needed for wheezing or shortness of breath.     [provider]  ENSURE PLUS (ENSURE PLUS) LIQD Take 237 mLs by mouth 3 (three) times daily between meals.    [provider]  folic acid (FOLVITE) [id number] MCG tablet Take 400 mcg by mouth daily.    [provider]  HYDROcodone-acetaminophen (NORCO/VICODIN) 5-325 MG tablet One tablet every six hours for pain.  Limit  7 days. 05/05/21   Sanjuana Kava, MD  linaclotide [facility]) 290 MCG CAPS capsule Take 1 capsule (290 mcg total) by mouth daily before breakfast. 09/26/20   [name], PA-C  lisinopril (ZESTRIL) 40 MG tablet Take 1 tablet (40 mg total) by mouth daily. 10/09/19   Roxan Hockey, MD  pantoprazole (PROTONIX) 40 MG tablet Take 1 tablet (40 mg total) by mouth 2 (two) times daily before a meal. 11/05/19   Gosrani, Nimish C, MD  PHENobarbital (LUMINAL) 32.4 MG tablet Take 32.4 mg by mouth 2 (two) times daily. 07/13/19   [provider]  phenytoin (DILANTIN) 100 MG ER capsule Take 100 mg by mouth 3 (three) times daily.    [provider]  potassium chloride SA (KLOR-CON) 20 MEQ tablet Take 20 mEq by mouth 3 (three) times daily. 08/25/20   [provider]  solifenacin (VESICARE) 5 MG tablet Take 5 mg by mouth daily. 03/11/20   [provider]    Physical Exam: Vitals:   05/28/21 1800 05/28/21 1803 05/28/21 2030 05/28/21 2208  BP: (!) 132/54 (!) 132/54 (!) 139/59 114/70  Pulse: (!) 108 (!) 106 (!) 105 (!) 123  Resp: (!) 22  (!) 23 20  Temp:    (!) 102.9 F (39.4 C)  TempSrc:    Oral  SpO2: 99%  98% 100%    Constitutional: NAD, calm, comfortable Vitals:   05/28/21 1800 05/28/21 1803 05/28/21 2030 05/28/21 2208  BP: (!) 132/54 (!) 132/54 (!) 139/59 114/70  Pulse: (!) 108 (!) 106 (!) 105 (!) 123  Resp: (!) 22  (!) 23 20  Temp:    (!) 102.9 F (39.4 C)  TempSrc:    Oral  SpO2: 99%  98% 100%   Eyes: PERRL, lids and conjunctivae normal ENMT: Mucous membranes are moist.  Neck: normal, supple, no masses, no thyromegaly Respiratory: clear to auscultation bilaterally, no wheezing, no crackles. Normal respiratory effort. No accessory muscle use.  Cardiovascular: Tachycardic, regular rate and rhythm, no murmurs / rubs / gallops. No extremity edema. 2+ pedal pulses.   Abdomen: Diffuse abdominal tenderness with guarding,  no masses palpated. No hepatosplenomegaly.  Bowel sounds positive.  Purewick tube draining purulent appearing urine. Musculoskeletal: no clubbing / cyanosis. No joint deformity upper and lower extremities. Good ROM, no contractures. Normal muscle tone.  Skin: no rashes, lesions, ulcers. No induration Neurologic: Right-sided weakness with contracture involving right upper extremity, abnormal speech slight to moderately slurred..  This is patient's baseline. Psychiatric: Normal judgment and insight. Alert and oriented x 3. Normal mood.   Labs on Admission: I have personally reviewed following labs and imaging studies  CBC: Recent Labs  Lab 05/25/21 0732 05/28/21 1641  WBC 3.5* 10.2  NEUTROABS 1.7 9.1*  HGB 9.8* 10.1*  HCT 29.7* 30.2*  MCV 83.9 83.9  PLT 114* A999333*   Basic Metabolic Panel: Recent Labs  Lab  05/25/21 0732 05/28/21 1641  NA 127* 129*  K 3.9 3.2*  CL 94* 98  CO2 22 20*  GLUCOSE 93 122*  BUN <5* <5*  CREATININE 0.62 0.87  CALCIUM 8.7* 8.7*   Liver Function Tests: Recent Labs  Lab 05/25/21 0732 05/28/21 1641  AST 64* 40  ALT 35 28  ALKPHOS 116 94  BILITOT 0.8 1.5*  PROT 7.6 7.5  ALBUMIN 3.8 3.7   Recent Labs  Lab 05/25/21 0732 05/28/21 1641  LIPASE 24 22   Coagulation Profile: Recent Labs  Lab 05/25/21 0732  INR 1.2    Urine analysis:    Component Value Date/Time   COLORURINE YELLOW 05/28/2021 2025   APPEARANCEUR CLOUDY (A) 05/28/2021 2025   LABSPEC 1.008 05/28/2021 2025   PHURINE 8.0 05/28/2021 2025   GLUCOSEU NEGATIVE 05/28/2021 2025   HGBUR LARGE (A) 05/28/2021 2025   BILIRUBINUR SMALL (A) 05/28/2021 2025   KETONESUR NEGATIVE 05/28/2021 2025   PROTEINUR 30 (A) 05/28/2021 2025   UROBILINOGEN 0.2 12/26/2014 1927   NITRITE NEGATIVE 05/28/2021 2025   LEUKOCYTESUR LARGE (A) 05/28/2021 2025    Radiological Exams on Admission: No results found.  EKG: Independently reviewed.  Sinus tachycardia rate 108, QTc 439.  No significant change from prior.  Assessment/Plan Principal  Problem:   Severe sepsis (HCC) Active Problems:   Lactic acidosis   Acute lower UTI   Diarrhea   Alcohol abuse   Seizure disorder (HCC)   Cirrhosis of liver without ascites (HCC)   Stroke (HCC)  Severe Sepsis secondary to urinary tract infection- tachycardic to 126, febrile to 102.9, tachypneic to 29-meeting severe sepsis criteria with lactic acidosis of 3.3 > 2.1, after fluid boluses.  Stable blood pressure systolic [id number].  Urine is purulent appearing, and suggestive of UTI with large leukocytes, many bacteria.  No recent urine cultures. - Diffuse abdominal pain recent CT unremarkable, will obtain CTA -IV ceftriaxone 2 g daily -Follow-up blood and urine cultures -Obtain portable chest x-ray -2.5 L bolus given, continue n/s + 40 kcl 100cc/hr x 20hrs  Abdominal pain, persistent lactic acidosis-3.3 > 2.4 today 2 L bolus.  Also diarrhea.  UTI would not explain her diffuse abdominal pain. - History of persistent lactic acidosis, likely secondary to liver cirrhosis and alcoholism, but with abdominal pain will obtain CTA. -Morphine 2 mg as needed - NPO except for meds, for now -IV Protonix 40 daily - Check Stool  c diff. -If repeat abdominal imaging unremarkable, may need to consider SBP with liver cirrhosis history.  I do not appreciate significant ascites at this time. -Ceftriaxone 2 g daily  Hyponatremia sodium 129.  Likely from alcohol abuse. -Hydrate  Alcohol abuse-drinks hard liquor, about 5 shots daily.  Blood alcohol level today less than 10, but was 107 just 3 days ago.  She reports last alcoholic beverage was 4 days ago prior to arrival in the ED. - CIWA prn and scheduled, possible component of alcohol withdrawal also -Thiamine folate multivitamins -Check magnesium, phosphorus  Hypokalemia potassium 3.2 likely from alcohol abuse. -  check Magnesium - Replete K   Seizure disorder-reports no seizures in several years. -Resume phenobarbital, phenytoin, she reports she is on  both and compliant -Check phenytoin levels  Stroke with right-sided deficits-stable.  Liver cirrhosis-no stigmata of chronic liver disease.  Liver enzymes unremarkable.   DVT prophylaxis:  Lovenox Code Status: Full code Family Communication: None at bedside Disposition Plan: ~/> 2 days Consults called: None Admission status: Inpt, tele I certify that at the  point of admission it is my clinical judgment that the patient will require inpatient hospital care spanning beyond 2 midnights from the point of admission due to high intensity of service, high risk for further deterioration and high frequency of surveillance required. The following factors support the patient status of inpatient:    Bethena Roys MD [facility]  05/28/2021, 11:12 PM

## 2021-05-28 NOTE — ED Notes (Signed)
Date and time results received: 05/28/21 1745 (use smartphrase ".now" to insert current time)  Test: lactic Critical Value: 3.3  Name of Provider Notified: Dr. Melina Copa  Orders Received? Or Actions Taken?: no/na

## 2021-05-28 NOTE — ED Notes (Signed)
Pt had incontinence of bowel; pt cleaned and new linen placed under pt

## 2021-05-28 NOTE — ED Provider Notes (Signed)
[facility] EMERGENCY DEPARTMENT Provider Note   CSN: [id number] Arrival date & time: 05/28/21  1353     History Chief Complaint  Patient presents with   Abdominal Pain   Fever    [name] is a 65 y.o. female.  HPI  Patient with significant medical history of alcohol use, asthma, GERD, hypertension, PUD, stroke presents to the emergency department with chief complaint of right side abdominal pain.  Patient states pain started approximate 4 days ago, states the pain is constant, describes it as a dull-like sensation, does not radiate, had nausea but denies vomiting, states she has some diarrhea, hematochezia, melena, denies  urinary symptoms.  Patient has been seen here in the past for stomach pain, she was most recently seen here on the eighth, for very similar complaint, imaging and lab work were unremarkable, was a discharge home.  Patient states this pain feels the same from when she was here a few days ago, she does not note that she has fevers and chills, and a slight cough, she denies recent sick contacts, is not update on her COVID-vaccine.  She does not endorse any alleviating factors.   Past Medical History:  Diagnosis Date   Alcohol abuse    Asthma    Contracture of muscle of hand    right   GERD (gastroesophageal reflux disease)    HTN (hypertension)    PUD (peptic ulcer disease)    remote   Seizures ([place])    since stroke, no recent seizures   Sleep apnea    Stroke [facility])    age 14, required brain surgery effected right side    Patient Active Problem List   Diagnosis Date Noted   SIRS (systemic inflammatory response syndrome) ([place]) 05/28/2021   Lactic acidosis 12/19/2020   Encephalopathy acute 97/98/9211   Acute metabolic encephalopathy 94/17/4081   Acute blood loss anemia 11/02/2019   Overweight (BMI 25.0-29.9) 11/02/2019   Gait disturbance    Rectal bleeding 10/08/2019   Hyponatremia    Diarrhea 04/11/2019   Nausea, vomiting, and diarrhea    ARF  (acute renal failure) ([place]) 04/10/2019   Acute kidney injury ([place]) 01/22/2019   History of stroke 01/22/2019   Asthma 01/22/2019   Gastroenteritis 01/22/2019   Stroke ([place]) 06/14/2018   Cirrhosis of liver without ascites ([place]) 04/21/2017   AKI (acute kidney injury) ([place]) 07/30/2016   Anemia 11/25/2015   Abnormal liver function    Coffee ground emesis    Reflux esophagitis    Sinus tachycardia ([place]) 10/10/2014   Hypokalemia 10/10/2014   Seizure disorder ([place]) 10/10/2014   Alcohol abuse 09/05/2012   Odynophagia 09/05/2012   GERD (gastroesophageal reflux disease) 09/05/2012   Abdominal pain 09/05/2012   FX CLOSED TIBIA NOS 07/27/2007   STROKE-With also h/o Aneurysm clipping? 07/25/2007   Seizures ([place]) 07/25/2007    Past Surgical History:  Procedure Laterality Date   ABDOMINAL HYSTERECTOMY  2004   complete   BRAIN SURGERY     age 50 stroke   COLONOSCOPY     COLONOSCOPY WITH PROPOFOL  10/03/2012   Dr. [name]: moderate diverticulosis, small internal hemorrhoids, TUBULAR ADENOMA. Next colonoscopy 2023 per Dr. [name].   ESOPHAGOGASTRODUODENOSCOPY N/A 10/18/2014   Dr. Gala Romney during inpatient hospitalization: severe exudative/erosive reflux esophagitis as source of trivial upper GI bleed. No varices    ESOPHAGOGASTRODUODENOSCOPY (EGD) WITH PROPOFOL  10/03/2012   Dr. [name]: stricture at [place] junction s/p dilation, mild gastritis negative H.pylori    ESOPHAGOGASTRODUODENOSCOPY (EGD) WITH PROPOFOL  N/A 12/27/2017   Grade D esophagitis. Medium-sized hiatal hernia, normal duodenum   right tib-fib fracture  2008   SAVORY DILATION  10/03/2012   Procedure: SAVORY DILATION;  Surgeon: Danie Binder, MD;  Location: [facility];  Service: Endoscopy;  Laterality: N/A;  started at 1303,  dilated 12.8-16mm     OB History     Gravida  4   Para  3   Term  3   Preterm      AB  1   Living  3      SAB  1   IAB      Ectopic      Multiple      Live Births              Family History   Problem Relation Age of Onset   Liver disease Sister        etoh   Colon cancer Neg Hx     Social History   Tobacco Use   Smoking status: Former    Packs/day: 0.04    Years: 9.00    Pack years: 0.36    Types: Cigarettes    Quit date: 07/28/2014    Years since quitting: 6.8   Smokeless tobacco: Never  Vaping Use   Vaping Use: Never used  Substance Use Topics   Alcohol use: Yes    Alcohol/week: 35.0 standard drinks    Types: 35 Cans of beer per week    Comment: Patient reports she drinks "a lot" of beer each day; 01/07/21 "1 or 2 at night"   Drug use: No    Home Medications Prior to Admission medications   Medication Sig Start Date End Date Taking? Authorizing Provider  albuterol (PROVENTIL HFA;VENTOLIN HFA) 108 (90 BASE) MCG/ACT inhaler Inhale 2 puffs into the lungs every 6 (six) hours as needed for wheezing or shortness of breath.     [provider]  ENSURE PLUS (ENSURE PLUS) LIQD Take 237 mLs by mouth 3 (three) times daily between meals.    [provider]  folic acid (FOLVITE) 270 MCG tablet Take 400 mcg by mouth daily.    [provider]  HYDROcodone-acetaminophen (NORCO/VICODIN) 5-325 MG tablet One tablet every six hours for pain.  Limit 7 days. 05/05/21   Sanjuana Kava, MD  linaclotide [facility]) 290 MCG CAPS capsule Take 1 capsule (290 mcg total) by mouth daily before breakfast. 09/26/20   [name], PA-C  lisinopril (ZESTRIL) 40 MG tablet Take 1 tablet (40 mg total) by mouth daily. 10/09/19   Roxan Hockey, MD  pantoprazole (PROTONIX) 40 MG tablet Take 1 tablet (40 mg total) by mouth 2 (two) times daily before a meal. 11/05/19   Gosrani, Nimish C, MD  PHENobarbital (LUMINAL) 32.4 MG tablet Take 32.4 mg by mouth 2 (two) times daily. 07/13/19   [provider]  phenytoin (DILANTIN) 100 MG ER capsule Take 100 mg by mouth 3 (three) times daily.    [provider]  potassium chloride SA (KLOR-CON) 20 MEQ tablet Take 20 mEq  by mouth 3 (three) times daily. 08/25/20   [provider]  solifenacin (VESICARE) 5 MG tablet Take 5 mg by mouth daily. 03/11/20   [provider]    Allergies    Patient has no known allergies.  Review of Systems   Review of Systems  Constitutional:  Positive for chills and fever.  HENT:  Negative for congestion.   Respiratory:  Positive for cough. Negative for  shortness of breath.   Cardiovascular:  Negative for chest pain.  Gastrointestinal:  Positive for abdominal pain, diarrhea and nausea. Negative for vomiting.  Genitourinary:  Negative for enuresis.  Musculoskeletal:  Negative for back pain and myalgias.  Skin:  Negative for rash.  Neurological:  Negative for dizziness.  Hematological:  Does not bruise/bleed easily.   Physical Exam Updated Vital Signs BP (!) 132/54   Pulse (!) 106   Temp (!) 100.5 F (38.1 C) (Oral)   Resp (!) 22   SpO2 99%   Physical Exam Vitals and nursing note reviewed.  Constitutional:      General: She is not in acute distress.    Appearance: She is not ill-appearing.  HENT:     Head: Normocephalic and atraumatic.     Nose: No congestion.  Eyes:     Conjunctiva/sclera: Conjunctivae normal.  Cardiovascular:     Rate and Rhythm: Normal rate and regular rhythm.     Pulses: Normal pulses.     Heart sounds: No murmur heard.   No friction rub. No gallop.  Pulmonary:     Effort: No respiratory distress.     Breath sounds: No wheezing, rhonchi or rales.  Abdominal:     Palpations: Abdomen is soft.     Tenderness: There is abdominal tenderness. There is no right CVA tenderness or left CVA tenderness.     Comments: Abdomen was visualized nondistended, normal bowel sounds, dull to percussion, she has right upper quadrant pain, negative guarding, rebound tenderness, peritoneal sign, no Murphy sign or McBurney point.  No CVA tenderness.  Musculoskeletal:     Right lower leg: No edema.     Left lower leg: No edema.  Skin:     General: Skin is warm and dry.  Neurological:     Mental Status: She is alert.  Psychiatric:        Mood and Affect: Mood normal.    ED Results / Procedures / Treatments   Labs (all labs ordered are listed, but only abnormal results are displayed) Labs Reviewed  COMPREHENSIVE METABOLIC PANEL - Abnormal; Notable for the following components:      Result Value   Sodium 129 (*)    Potassium 3.2 (*)    CO2 20 (*)    Glucose, Bld 122 (*)    BUN <5 (*)    Calcium 8.7 (*)    Total Bilirubin 1.5 (*)    All other components within normal limits  CBC WITH DIFFERENTIAL/PLATELET - Abnormal; Notable for the following components:   RBC 3.60 (*)    Hemoglobin 10.1 (*)    HCT 30.2 (*)    RDW 18.0 (*)    Platelets 110 (*)    Neutro Abs 9.1 (*)    Lymphs Abs 0.4 (*)    Abs Immature Granulocytes 0.20 (*)    All other components within normal limits  LACTIC ACID, PLASMA - Abnormal; Notable for the following components:   Lactic Acid, Venous 3.3 (*)    All other components within normal limits  RESP PANEL BY RT-PCR (FLU A&B, COVID) ARPGX2  LIPASE, BLOOD  ETHANOL  LACTIC ACID, PLASMA  URINALYSIS, ROUTINE W REFLEX MICROSCOPIC    EKG EKG Interpretation  Date/Time:  Thursday May 28 2021 15:01:02 EDT Ventricular Rate:  108 PR Interval:  139 QRS Duration: 87 QT Interval:  327 QTC Calculation: 439 R Axis:   29 Text Interpretation: Sinus tachycardia Abnormal R-wave progression, early transition rate increased from prior 7/22 Confirmed by  Aletta Edouard [phone]) on 05/28/2021 3:49:29 PM  Radiology No results found.  Procedures Procedures   Medications Ordered in ED Medications  LORazepam (ATIVAN) injection 0-4 mg (0 mg Intravenous Not Given 05/28/21 1808)    Or  LORazepam (ATIVAN) tablet 0-4 mg ( Oral See Alternative 05/28/21 1808)  LORazepam (ATIVAN) injection 0-4 mg (has no administration in time range)    Or  LORazepam (ATIVAN) tablet 0-4 mg (has no administration in time  range)  thiamine tablet 100 mg ( Oral See Alternative 05/28/21 1828)    Or  thiamine (B-1) injection 100 mg (100 mg Intravenous Given 05/28/21 1828)  alum & mag hydroxide-simeth (MAALOX/MYLANTA) 200-200-20 MG/5ML suspension 30 mL (30 mLs Oral Not Given 05/28/21 1831)    And  lidocaine (XYLOCAINE) 2 % viscous mouth solution 15 mL (15 mLs Oral Not Given 05/28/21 1832)  sodium chloride 0.9 % bolus 1,000 mL (0 mLs Intravenous Stopped 05/28/21 1828)  acetaminophen (TYLENOL) tablet 650 mg (650 mg Oral Given 05/28/21 1722)  morphine 4 MG/ML injection 4 mg (4 mg Intravenous Given 05/28/21 1722)  sodium chloride 0.9 % bolus 1,000 mL (1,000 mLs Intravenous New Bag/Given 05/28/21 1827)  pantoprazole (PROTONIX) injection 40 mg (40 mg Intravenous Given 05/28/21 1831)    ED Course  I have reviewed the triage vital signs and the nursing notes.  Pertinent labs & imaging results that were available during my care of the patient were reviewed by me and considered in my medical decision making (see chart for details).  Clinical Course as of 05/28/21 1847  Thu May 28, 9214  [id number] 65 year old female here with ongoing abdominal pain.  Was seen here a few days ago and had CT imaging that did not show any obvious explanation of her symptoms.  Lactate was elevated at that time.  Continues to have pain and now has low-grade fever.  Getting labs.  Likely admission. [MB]    Clinical Course User Index [MB] Hayden Rasmussen, MD   MDM Rules/Calculators/A&P                          Initial impression-patient presents with abdominal pain.  She is alert, does not appear acute chest, vital signs are for tachycardia, concern for possible intra-abdominal infection, will obtain basic lab work-up, provide patient with fluids, pain medications, Tylenol and reassess.  Work-up-CBC reveals normocytic anemia hemoglobin 10, CMP shows hyponatremia of 129, hypokalemia 3.2, decreased CO2 of 20, lactic 3.3, ethanol less than  10.  Reassessment-patient was reassessed after fluids, Tylenol, morphine, states she still has tight upper quadrant pain, she still remains tachycardic.  Will recommend admission due to right upper quadrant pain patient is agreement this plan.  Will consult hospitalist for admission.  Consult- Spoke with Dr. Su Ley who will admit the patient.  Rule out- Low suspicion for sepsis as patient does not meet sepsis criteria, she is not hypotensive, does not have a white count, no source.  She does meet SARS suspect she is suffering from possible viral gastroenteritis, will defer antibiotics at this time I suspect this is more viral in nature since she does not have a white count, she also had a negative blood culture on her last visit.  I have low suspicion for liver failure she has no elevation in liver enzymes, only a slight increase in T bili, she is not jaundiced on my exam, no abnormal bleeding or bruising present.  Low suspicion for liver or gallbladder abnormality as liver  enzymes and alk phos are within normal limits.  Low suspicion for SOB as abdomen is nondistended dull to percussion, she still passing flatus and having bowel movements.  Low suspicion for incarcerated hernia as there is no mass present my exam, CT imaging was negative for this.  Low suspicion for intra-abdominal abscess as patient has no risk factors, she has no leukocytosis, recent CT abdomen pelvis was negative for acute findings.  Low  suspicion for UTI, pyelonephritis, kidney stone as she has no CVA tenderness, denies urinary symptoms, CT imaging was -2 days prior.    Plan-admit to medicine due to right upper quadrant pain likely gastroenteritis. Final Clinical Impression(s) / ED Diagnoses Final diagnoses:  Right upper quadrant abdominal pain    Rx / DC Orders ED Discharge Orders     None        Marcello Fennel, PA-C 05/28/21 1847    Hayden Rasmussen, MD 05/29/21 1110

## 2021-05-29 ENCOUNTER — Other Ambulatory Visit: Payer: Self-pay

## 2021-05-29 ENCOUNTER — Inpatient Hospital Stay (HOSPITAL_COMMUNITY): Payer: Medicare Other

## 2021-05-29 DIAGNOSIS — R6520 Severe sepsis without septic shock: Secondary | ICD-10-CM

## 2021-05-29 LAB — BASIC METABOLIC PANEL
Anion gap: 8 (ref 5–15)
BUN: 6 mg/dL — ABNORMAL LOW (ref 8–23)
CO2: 19 mmol/L — ABNORMAL LOW (ref 22–32)
Calcium: 7.9 mg/dL — ABNORMAL LOW (ref 8.9–10.3)
Chloride: 109 mmol/L (ref 98–111)
Creatinine, Ser: 0.7 mg/dL (ref 0.44–1.00)
GFR, Estimated: >60 mL/min (ref >60)
Glucose, Bld: 93 mg/dL (ref 70–99)
Potassium: 2.9 mmol/L — ABNORMAL LOW (ref 3.5–5.1)
Sodium: 136 mmol/L (ref 135–145)

## 2021-05-29 LAB — CBC
HCT: 26.4 % — ABNORMAL LOW (ref 36.0–46.0)
Hemoglobin: 8.6 g/dL — ABNORMAL LOW (ref 12.0–15.0)
MCH: 28.4 pg (ref 26.0–34.0)
MCHC: 32.6 g/dL (ref 30.0–36.0)
MCV: 87.1 fL (ref 80.0–100.0)
Platelets: 91 10*3/uL — ABNORMAL LOW (ref 150–400)
RBC: 3.03 MIL/uL — ABNORMAL LOW (ref 3.87–5.11)
RDW: 18.9 % — ABNORMAL HIGH (ref 11.5–15.5)
WBC: 8.7 10*3/uL (ref 4.0–10.5)
nRBC: 0 % (ref 0.0–0.2)

## 2021-05-29 LAB — HIV ANTIBODY (ROUTINE TESTING W REFLEX): HIV Screen 4th Generation wRfx: NONREACTIVE

## 2021-05-29 MED ORDER — MAGNESIUM SULFATE 4 GM/100ML IV SOLN
4.0000 g | Freq: Once | INTRAVENOUS | Status: AC
  Administered 2021-05-29: 4 g via INTRAVENOUS
  Filled 2021-05-29: qty 100

## 2021-05-29 MED ORDER — POTASSIUM CHLORIDE CRYS ER 20 MEQ PO TBCR
60.0000 meq | EXTENDED_RELEASE_TABLET | Freq: Once | ORAL | Status: AC
  Administered 2021-05-29: 60 meq via ORAL
  Filled 2021-05-29: qty 3

## 2021-05-29 MED ORDER — METRONIDAZOLE 500 MG/100ML IV SOLN
500.0000 mg | Freq: Three times a day (TID) | INTRAVENOUS | Status: DC
  Administered 2021-05-29 – 2021-06-03 (×14): 500 mg via INTRAVENOUS
  Filled 2021-05-29 (×14): qty 100

## 2021-05-29 NOTE — Progress Notes (Signed)
IV access gained on 300 for Ct scan.  Patient sent downstairs for CT scan,and IV became faulty and patient sent back upstairs.  Will try again later to gain new iv access so patient can have CT.

## 2021-05-29 NOTE — Progress Notes (Signed)
PROGRESS NOTE    [name]  [id number] DOB: [birth year] DOA: 05/28/2021 PCP: [name], MD    Brief Narrative:  65 y.o. female with medical history significant for alcohol abuse, liver cirrhosis, stroke with right-sided deficits, seizure disorder. History is challenging due to stroke patient has abnormal speech.  Patient presented to the ED with complaints of abdominal pain of about 1 week duration.  She reports dry heaving, and multiple loose stools also.  She reports abdominal pain is worse with food.  She reports pain with urination, and change in color of her urine over the past 3 days.   Patient was in the ED 3 days ago 8/8 for abdominal pain  Assessment & Plan:   Principal Problem:   Severe sepsis ([place]) Active Problems:   Alcohol abuse   Seizure disorder ([place])   Cirrhosis of liver without ascites (H[facility])   Stroke ([facility])   Diarrhea   Lactic acidosis   Acute lower UTI  Severe Sepsis secondary to urinary tract infection-  -Presented tachycardic to 126, febrile to 102.9, tachypneic to 29-meeting severe sepsis criteria with lactic acidosis of 3.3 > 2.1, after fluid boluses.  Stable blood pressure systolic [id number].  Urine is purulent appearing, and suggestive of UTI with large leukocytes, many bacteria.  -Blood cultures pending -IV ceftriaxone 2 g daily -Follow-up blood and urine cultures -2.5 L bolus given, continue n/s + 40 kcl 100cc/hr x 20hrs -Continue IVF hydration as tolerated   Abdominal pain, persistent lactic acidosis secondary to enteritis -CT abd/pelvis reviewed.  - History of persistent lactic acidosis, likely secondary to liver cirrhosis and alcoholism, but with abdominal pain will obtain CTA. -Morphine 2 mg as needed -IV Protonix 40 daily -Continue Ceftriaxone 2 g daily with flagyl ordered   Hyponatremia sodium 129.  Likely from alcohol abuse. -Improved with IVF hydration   Alcohol abuse -drinks hard liquor, about 5 shots daily.  Blood alcohol  level today less than 10, but was 107 just 3 days ago.  She reports last alcoholic beverage was 4 days ago prior to arrival in the ED. - Continue CIWA as needed -Thiamine folate multivitamins -Check magnesium, phosphorus   Hypokalemia potassium 3.2 likely from alcohol abuse. -  check Magnesium - Replete K    Seizure disorder- -reports no seizures in several years. -Resume phenobarbital, phenytoin, she reports she is on both and compliant   Stroke with right-sided deficits-stable.   Liver cirrhosis-no stigmata of chronic liver disease.  Liver enzymes unremarkable.   DVT prophylaxis: Lovenox subq Code Status: Full Family Communication: Pt in room, family not at bedside  Status is: Inpatient  Remains inpatient appropriate because:Inpatient level of care appropriate due to severity of illness  Dispo: The patient is from: Home              Anticipated d/c is to: Home              Patient currently is not medically stable to d/c.   Difficult to place patient No       Consultants:    Procedures:    Antimicrobials: Anti-infectives (From admission, onward)    Start     Dose/Rate Route Frequency Ordered Stop   05/29/21 1530  metroNIDAZOLE (FLAGYL) IVPB 500 mg        500 mg 100 mL/hr over 60 Minutes Intravenous Every 8 hours 05/29/21 1430     05/29/21 0015  cefTRIAXone (ROCEPHIN) 2 g in sodium chloride 0.9 % 100 mL IVPB  2 g 200 mL/hr over 30 Minutes Intravenous Every 24 hours 05/28/21 2320     05/28/21 2330  cefTRIAXone (ROCEPHIN) 1 g in sodium chloride 0.9 % 100 mL IVPB  Status:  Discontinued        1 g 200 mL/hr over 30 Minutes Intravenous Every 24 hours 05/28/21 2241 05/28/21 2320       Subjective: Complaining of abd discomfort  Objective: Vitals:   05/29/21 0100 05/29/21 0508 05/29/21 0901 05/29/21 1226  BP: 133/61 (!) 145/51 (!) 143/54 (!) 145/51  Pulse: (!) 107 (!) 104 (!) 110 (!) 108  Resp: '19 20 19 18  '$ Temp: 100.2 F (37.9 C) 98.7 F (37.1 C)  100 F (37.8 C) 98.3 F (36.8 C)  TempSrc: Oral Oral Oral Oral  SpO2: 100% 100% 100% 99%  Weight:      Height:        Intake/Output Summary (Last 24 hours) at 05/29/2021 1448 Last data filed at 05/29/2021 1312 Gross per 24 hour  Intake 1467.17 ml  Output 1100 ml  Net 367.17 ml   Filed Weights   05/28/21 2208  Weight: 66.9 kg    Examination: General exam: Awake, laying in bed, in nad Respiratory system: Normal respiratory effort, no wheezing Cardiovascular system: regular rate, s1, s2 Gastrointestinal system: Soft, nondistended, positive BS, generally tender Central nervous system: CN2-12 grossly intact, strength intact Extremities: Perfused, no clubbing Skin: Normal skin turgor, no notable skin lesions seen Psychiatry: Mood normal // no visual hallucinations   Data Reviewed: I have personally reviewed following labs and imaging studies  CBC: Recent Labs  Lab 05/25/21 0732 05/28/21 1641 05/29/21 0601  WBC 3.5* 10.2 8.7  NEUTROABS 1.7 9.1*  --   HGB 9.8* 10.1* 8.6*  HCT 29.7* 30.2* 26.4*  MCV 83.9 83.9 87.1  PLT 114* 110* 91*   Basic Metabolic Panel: Recent Labs  Lab 05/25/21 0732 05/28/21 1641 05/28/21 1642 05/29/21 0601  NA 127* 129*  --  136  K 3.9 3.2*  --  2.9*  CL 94* 98  --  109  CO2 22 20*  --  19*  GLUCOSE 93 122*  --  93  BUN <5* <5*  --  6*  CREATININE 0.62 0.87  --  0.70  CALCIUM 8.7* 8.7*  --  7.9*  MG  --   --  1.4*  --   PHOS  --   --  3.0  --    GFR: Estimated Creatinine Clearance: 66.8 mL/min (by C-G formula based on SCr of 0.7 mg/dL). Liver Function Tests: Recent Labs  Lab 05/25/21 0732 05/28/21 1641  AST 64* 40  ALT 35 28  ALKPHOS 116 94  BILITOT 0.8 1.5*  PROT 7.6 7.5  ALBUMIN 3.8 3.7   Recent Labs  Lab 05/25/21 0732 05/28/21 1641  LIPASE 24 22   No results for input(s): AMMONIA in the last 168 hours. Coagulation Profile: Recent Labs  Lab 05/25/21 0732  INR 1.2   Cardiac Enzymes: No results for input(s):  CKTOTAL, CKMB, CKMBINDEX, TROPONINI in the last 168 hours. BNP (last 3 results) No results for input(s): PROBNP in the last 8760 hours. HbA1C: No results for input(s): HGBA1C in the last 72 hours. CBG: No results for input(s): GLUCAP in the last 168 hours. Lipid Profile: No results for input(s): CHOL, HDL, LDLCALC, TRIG, CHOLHDL, LDLDIRECT in the last 72 hours. Thyroid Function Tests: No results for input(s): TSH, T4TOTAL, FREET4, T3FREE, THYROIDAB in the last 72 hours. Anemia Panel: No results for  input(s): VITAMINB12, FOLATE, FERRITIN, TIBC, IRON, RETICCTPCT in the last 72 hours. Sepsis Labs: Recent Labs  Lab 05/25/21 1131 05/25/21 1327 05/28/21 1641 05/28/21 1830  LATICACIDVEN 4.3* 2.3* 3.3* 2.1*    Recent Results (from the past 240 hour(s))  Culture, blood (routine x 2)     Status: Abnormal   Collection Time: 05/25/21  8:33 AM   Specimen: BLOOD LEFT FOREARM  Result Value Ref Range Status   Specimen Description   Final    BLOOD LEFT FOREARM Performed at [facility], [address] [place], [place] [address]    Special Requests   Final    BOTTLES DRAWN AEROBIC AND ANAEROBIC Blood Culture results may not be optimal due to an inadequate volume of blood received in culture bottles Performed at [facility], [address] [place], [place] [address]    Culture  Setup Time   Final    GRAM POSITIVE COCCI IN BOTH AEROBIC AND ANAEROBIC BOTTLES Gram Stain Report Called to,Read Back By and Verified With: DAVIS,M (MHP) AT [id number] ON 8.9.22 BY RUCINSKI,B Performed at [facility], READ BACK BY AND VERIFIED WITH: Casandra Doffing RN 2014 05/26/21 A BROWNING    Culture (A)  Final    MICROCOCCUS LUTEUS/LYLAE THE SIGNIFICANCE OF ISOLATING THIS ORGANISM FROM A SINGLE SET OF BLOOD CULTURES WHEN MULTIPLE SETS ARE DRAWN IS UNCERTAIN. PLEASE NOTIFY THE MICROBIOLOGY DEPARTMENT WITHIN ONE WEEK IF SPECIATION AND SENSITIVITIES ARE REQUIRED. Performed at [facility], [place] [address] [place], [place] [address]    Report Status 05/28/2021 FINAL  Final  Blood Culture ID Panel (Reflexed)     Status: None   Collection Time: 05/25/21  8:33 AM  Result Value Ref Range Status   Enterococcus faecalis NOT DETECTED NOT DETECTED Final   Enterococcus Faecium NOT DETECTED NOT DETECTED Final   Listeria monocytogenes NOT DETECTED NOT DETECTED Final   Staphylococcus species NOT DETECTED NOT DETECTED Final   Staphylococcus aureus (BCID) NOT DETECTED NOT DETECTED Final   Staphylococcus epidermidis NOT DETECTED NOT DETECTED Final   Staphylococcus lugdunensis NOT DETECTED NOT DETECTED Final   Streptococcus species NOT DETECTED NOT DETECTED Final   Streptococcus agalactiae NOT DETECTED NOT DETECTED Final   Streptococcus pneumoniae NOT DETECTED NOT DETECTED Final   Streptococcus pyogenes NOT DETECTED NOT DETECTED Final   A.calcoaceticus-baumannii NOT DETECTED NOT DETECTED Final   Bacteroides fragilis NOT DETECTED NOT DETECTED Final   Enterobacterales NOT DETECTED NOT DETECTED Final   Enterobacter cloacae complex NOT DETECTED NOT DETECTED Final   Escherichia coli NOT DETECTED NOT DETECTED Final   Klebsiella aerogenes NOT DETECTED NOT DETECTED Final   Klebsiella oxytoca NOT DETECTED NOT DETECTED Final   Klebsiella pneumoniae NOT DETECTED NOT DETECTED Final   Proteus species NOT DETECTED NOT DETECTED Final   Salmonella species NOT DETECTED NOT DETECTED Final   Serratia marcescens NOT DETECTED NOT DETECTED Final   Haemophilus influenzae NOT DETECTED NOT DETECTED Final   Neisseria meningitidis NOT DETECTED NOT DETECTED Final   Pseudomonas aeruginosa NOT DETECTED NOT DETECTED Final   Stenotrophomonas maltophilia NOT DETECTED NOT DETECTED Final   Candida albicans NOT DETECTED NOT DETECTED Final   Candida auris NOT DETECTED NOT DETECTED Final   Candida glabrata NOT DETECTED NOT DETECTED Final   Candida krusei NOT DETECTED NOT DETECTED Final   Candida parapsilosis NOT  DETECTED NOT DETECTED Final   Candida tropicalis NOT DETECTED NOT DETECTED Final   Cryptococcus neoformans/gattii NOT DETECTED NOT DETECTED Final  Comment: Performed at [facility], [place] [address] [place], [place] [address]  Culture, blood (routine x 2)     Status: None (Preliminary result)   Collection Time: 05/25/21  8:38 AM   Specimen: BLOOD LEFT HAND  Result Value Ref Range Status   Specimen Description BLOOD LEFT HAND  Final   Special Requests   Final    BOTTLES DRAWN AEROBIC AND ANAEROBIC Blood Culture adequate volume   Culture   Final    NO GROWTH 4 DAYS Performed at [facility], [address] [place], [place] [address]    Report Status PENDING  Incomplete  Urine Culture     Status: Abnormal   Collection Time: 05/25/21  9:39 AM   Specimen: Urine, Clean Catch  Result Value Ref Range Status   Specimen Description   Final    URINE, CLEAN CATCH Performed at [facility], [address], [place], [place] [address]    Special Requests   Final    NONE Performed at [facility], [address] [place], [place] [address]    Culture MULTIPLE SPECIES PRESENT, SUGGEST RECOLLECTION (A)  Final   Report Status 05/27/2021 FINAL  Final  Resp Panel by RT-PCR (Flu A&B, Covid) Nasopharyngeal Swab     Status: None   Collection Time: 05/28/21  4:12 PM   Specimen: Nasopharyngeal Swab; Nasopharyngeal(NP) swabs in vial transport medium  Result Value Ref Range Status   SARS Coronavirus 2 by RT PCR NEGATIVE NEGATIVE Final    Comment: (NOTE) SARS-CoV-2 target nucleic acids are NOT DETECTED.  The SARS-CoV-2 RNA is generally detectable in upper respiratory specimens during the acute phase of infection. The lowest concentration of SARS-CoV-2 viral copies this assay can detect is 138 copies/mL. A negative result does not preclude SARS-Cov-2 infection and should not be used as the sole basis for treatment or other patient management decisions. A negative result may occur with  improper  specimen collection/handling, submission of specimen other than nasopharyngeal swab, presence of viral mutation(s) within the areas targeted by this assay, and inadequate number of viral copies(<138 copies/mL). A negative result must be combined with clinical observations, patient history, and epidemiological information. The expected result is Negative.  Fact Sheet for Patients:  EntrepreneurPulse.com.au  Fact Sheet for Healthcare Providers:  IncredibleEmployment.be  This test is no t yet approved or cleared by the [place] FDA and  has been authorized for detection and/or diagnosis of SARS-CoV-2 by FDA under an Emergency Use Authorization (EUA). This EUA will remain  in effect (meaning this test can be used) for the duration of the COVID-19 declaration under Section 564(b)(1) of the Act, 21 U.S.C.section 360bbb-3(b)(1), unless the authorization is terminated  or revoked sooner.       Influenza A by PCR NEGATIVE NEGATIVE Final   Influenza B by PCR NEGATIVE NEGATIVE Final    Comment: (NOTE) The Xpert Xpress SARS-CoV-2/FLU/RSV plus assay is intended as an aid in the diagnosis of influenza from Nasopharyngeal swab specimens and should not be used as a sole basis for treatment. Nasal washings and aspirates are unacceptable for Xpert Xpress SARS-CoV-2/FLU/RSV testing.  Fact Sheet for Patients: EntrepreneurPulse.com.au  Fact Sheet for Healthcare Providers: IncredibleEmployment.be  This test is not yet approved or cleared by the [place] FDA and has been authorized for detection and/or diagnosis of SARS-CoV-2 by FDA under an Emergency Use Authorization (EUA). This EUA will remain in effect (meaning this test can be used) for the duration of the COVID-19 declaration under Section  564(b)(1) of the Act, 21 U.S.C. section 360bbb-3(b)(1), unless the authorization is terminated or revoked.  Performed at  [facility], [address] [place], [place] [address]      Radiology Studies: DG CHEST PORT 1 VIEW  Result Date: 05/28/2021 CLINICAL DATA:  Fever with chest pain EXAM: PORTABLE CHEST 1 VIEW COMPARISON:  05/25/2021 FINDINGS: The heart size and mediastinal contours are within normal limits. Both lungs are clear. Aortic atherosclerosis. The visualized skeletal structures are unremarkable. IMPRESSION: No active disease. Electronically Signed   By: Donavan Foil M.D.   On: 05/28/2021 23:52   CT Angio Abd/Pel w/ and/or w/o  Result Date: 05/29/2021 CLINICAL DATA:  Persistent generalized abdominal pain, diffuse abdominal tenderness, lactic acidosis, fever, diarrhea and vomiting. EXAM: CT ANGIOGRAPHY ABDOMEN AND PELVIS WITH CONTRAST TECHNIQUE: Multidetector CT imaging of the abdomen and pelvis was performed using the standard protocol during bolus administration of intravenous contrast. Multiplanar reconstructed images and MIPs were obtained and reviewed to evaluate the vascular anatomy. CONTRAST:  149m OMNIPAQUE IOHEXOL 350 MG/ML SOLN COMPARISON:  CT of the abdomen and pelvis on 05/25/2021 FINDINGS: VASCULAR Aorta: Normally patent without significant atherosclerosis. No evidence of aneurysm, dissection or stenosis. Celiac: Mild calcified plaque at origin without significant stenosis. Distal branch vessels are normally patent and demonstrate normal branching anatomy. SMA: Mild calcified plaque at origin without significant stenosis. Distal branch vessels are normally patent. Renals: Single right renal artery and 2 separate left renal arteries demonstrate normal patency. IMA: Normally patent. Inflow: Normally patent bilateral iliac arteries without evidence of stenosis or aneurysm. Proximal Outflow: Normally patent bilateral common femoral arteries and femoral bifurcations. Veins: Venous phase imaging demonstrates normal patency of venous structures including the IVC, bilateral renal veins, iliac veins and  common femoral veins. Visualized mesenteric veins, splenic vein and portal vein are normally patent. Review of the MIP images confirms the above findings. NON-VASCULAR Lower chest: Stable small hiatal hernia. Hepatobiliary: Stable appearance of cirrhosis of the liver without visible mass lesion. The gallbladder and bile ducts are unremarkable. Pancreas: Unremarkable. No pancreatic ductal dilatation or surrounding inflammatory changes. Spleen: Normal in size without focal abnormality. Adrenals/Urinary Tract: Adrenal glands are unremarkable. Kidneys are normal, without renal calculi, focal lesion, or hydronephrosis. Bladder is unremarkable. Stomach/Bowel: Diffuse fluid present throughout multiple nondilated small bowel loops as well as the colon. Findings are suggestive of enteritis. At the level of multiple descending colonic and sigmoid diverticula, there are some new mild surrounding inflammatory changes in the pericolonic fat especially at the level of the distal descending colon and proximal sigmoid colon. Component of mild diverticulitis is suspected. There is no evidence of focal abscess or extraluminal air. No evidence of bowel obstruction, focal mass or free intraperitoneal air. No varices identified. Lymphatic: No enlarged abdominal or pelvic lymph nodes. Reproductive: Status post hysterectomy. No adnexal masses. Other: No abdominal wall hernia or abnormality. No abdominopelvic ascites. Musculoskeletal: No acute or significant osseous findings. IMPRESSION: VASCULAR No evidence of mesenteric arterial occlusive disease. NON-VASCULAR 1. Fluid in multiple nondilated small bowel loops and the colon is suggestive of enteritis. 2. Possible additional component of diverticulitis at the level of the distal descending colon and proximal sigmoid colon due to some new surrounding inflammatory changes in the pericolonic fat. No evidence of focal abscess or extraluminal air. 3. Stable evidence of cirrhosis of the liver.  No visible associated ascites or varices. 4. Stable small hiatal hernia. Electronically Signed   By: GAletta EdouardM.D.   On: 05/29/2021 12:10  Scheduled Meds:  enoxaparin (LOVENOX) injection  40 mg Subcutaneous [id number]   folic acid  1 mg Oral Daily   LORazepam  0-4 mg Intravenous Q6H   Followed by   Derrill Memo ON 05/31/2021] LORazepam  0-4 mg Intravenous Q12H   multivitamin with minerals  1 tablet Oral Daily   pantoprazole (PROTONIX) IV  40 mg Intravenous Q24H   PHENobarbital  32.4 mg Oral BID   phenytoin  100 mg Oral TID   thiamine  100 mg Oral Daily   Or   thiamine  100 mg Intravenous Daily   Continuous Infusions:  0.9 % NaCl with KCl 40 mEq / L 100 mL/hr at 05/29/21 0202   cefTRIAXone (ROCEPHIN)  IV 2 g (05/29/21 0203)   metronidazole       LOS: 1 day   Marylu Lund, MD [facility] Pager On Amion  If 7PM-7AM, please contact night-coverage 05/29/2021, 2:48 PM

## 2021-05-30 LAB — CBC
HCT: 29 % — ABNORMAL LOW (ref 36.0–46.0)
Hemoglobin: 8.8 g/dL — ABNORMAL LOW (ref 12.0–15.0)
MCH: 27.4 pg (ref 26.0–34.0)
MCHC: 30.3 g/dL (ref 30.0–36.0)
MCV: 90.3 fL (ref 80.0–100.0)
Platelets: 98 10*3/uL — ABNORMAL LOW (ref 150–400)
RBC: 3.21 MIL/uL — ABNORMAL LOW (ref 3.87–5.11)
RDW: 19.4 % — ABNORMAL HIGH (ref 11.5–15.5)
WBC: 6.8 10*3/uL (ref 4.0–10.5)
nRBC: 0 % (ref 0.0–0.2)

## 2021-05-30 LAB — COMPREHENSIVE METABOLIC PANEL
ALT: 22 U/L (ref 0–44)
AST: 35 U/L (ref 15–41)
Albumin: 2.9 g/dL — ABNORMAL LOW (ref 3.5–5.0)
Alkaline Phosphatase: 76 U/L (ref 38–126)
Anion gap: 7 (ref 5–15)
BUN: 7 mg/dL — ABNORMAL LOW (ref 8–23)
CO2: 21 mmol/L — ABNORMAL LOW (ref 22–32)
Calcium: 8.5 mg/dL — ABNORMAL LOW (ref 8.9–10.3)
Chloride: 114 mmol/L — ABNORMAL HIGH (ref 98–111)
Creatinine, Ser: 0.61 mg/dL (ref 0.44–1.00)
GFR, Estimated: >60 mL/min (ref >60)
Glucose, Bld: 89 mg/dL (ref 70–99)
Potassium: 3.8 mmol/L (ref 3.5–5.1)
Sodium: 142 mmol/L (ref 135–145)
Total Bilirubin: 0.7 mg/dL (ref 0.3–1.2)
Total Protein: 6.4 g/dL — ABNORMAL LOW (ref 6.5–8.1)

## 2021-05-30 LAB — CULTURE, BLOOD (ROUTINE X 2)
Culture: NO GROWTH
Special Requests: ADEQUATE

## 2021-05-30 LAB — MAGNESIUM: Magnesium: 2.3 mg/dL (ref 1.7–2.4)

## 2021-05-30 NOTE — Progress Notes (Signed)
PROGRESS NOTE    [name]  [id number] DOB: 03-04-1956 DOA: 05/28/2021 PCP: Lauren Fire, MD    Brief Narrative:  65 y.o. female with medical history significant for alcohol abuse, liver cirrhosis, stroke with right-sided deficits, seizure disorder. History is challenging due to stroke patient has abnormal speech.  Patient presented to the ED with complaints of abdominal pain of about 1 week duration.  She reports dry heaving, and multiple loose stools also.  She reports abdominal pain is worse with food.  She reports pain with urination, and change in color of her urine over the past 3 days.   Patient was in the ED 3 days ago 8/8 for abdominal pain  Assessment & Plan:   Principal Problem:   Severe sepsis ([place]) Active Problems:   Alcohol abuse   Seizure disorder ([place])   Cirrhosis of liver without ascites ([facility])   Stroke ([facility])   Diarrhea   Lactic acidosis   Acute lower UTI  Severe Sepsis secondary to urinary tract infection-  -Presented tachycardic to 126, febrile to 102.9, tachypneic to 29-meeting severe sepsis criteria with lactic acidosis of 3.3 > 2.1, after fluid boluses.  Stable blood pressure systolic [id number].  Urine is purulent appearing, and suggestive of UTI with large leukocytes, many bacteria.  -Blood cultures with 1/2 Micrococcus luteus -IV ceftriaxone 2 g daily -Follow-up blood and urine cultures -Continue IVF hydration as tolerated   Abdominal pain, persistent lactic acidosis secondary to enteritis -CT abd/pelvis reviewed.  - History of persistent lactic acidosis, likely secondary to liver cirrhosis and alcoholism, but with abdominal pain will obtain CTA. -Morphine 2 mg as needed -IV Protonix 40 daily -Continue Ceftriaxone 2 g daily with flagyl ordered -Pt reports feeling better. Wanting to try something to eat   Hyponatremia  - At presentation sodium of 129.  Likely from alcohol abuse. -Normalized with IVF hydration   Alcohol abuse -drinks hard  liquor, about 5 shots daily.  Blood alcohol level today less than 10, but was 107 just 3 days ago.  She reports last alcoholic beverage was 4 days ago prior to arrival in the ED. - Continue CIWA as needed -Thiamine folate multivitamins -Check magnesium, phosphorus   Hypokalemia potassium 3.2 likely from alcohol abuse. -  replaced   Seizure disorder- -reports no seizures in several years. -Resume phenobarbital, phenytoin, she reports she is on both and compliant -Stable thus far   Stroke with right-sided deficits-stable.   Liver cirrhosis-no stigmata of chronic liver disease.  Liver enzymes unremarkable. -Bili normalized    DVT prophylaxis: Lovenox subq Code Status: Full Family Communication: Pt in room, family not at bedside  Status is: Inpatient  Remains inpatient appropriate because:Inpatient level of care appropriate due to severity of illness  Dispo: The patient is from: Home              Anticipated d/c is to: Home              Patient currently is not medically stable to d/c.   Difficult to place patient No   Consultants:    Procedures:    Antimicrobials: Anti-infectives (From admission, onward)    Start     Dose/Rate Route Frequency Ordered Stop   05/29/21 1530  metroNIDAZOLE (FLAGYL) IVPB 500 mg        500 mg 100 mL/hr over 60 Minutes Intravenous Every 8 hours 05/29/21 1430     05/29/21 0015  cefTRIAXone (ROCEPHIN) 2 g in sodium chloride 0.9 % 100 mL IVPB  2 g 200 mL/hr over 30 Minutes Intravenous Every 24 hours 05/28/21 2320     05/28/21 2330  cefTRIAXone (ROCEPHIN) 1 g in sodium chloride 0.9 % 100 mL IVPB  Status:  Discontinued        1 g 200 mL/hr over 30 Minutes Intravenous Every 24 hours 05/28/21 2241 05/28/21 2320       Subjective: Complaining of wanting something to eat  Objective: Vitals:   05/29/21 1800 05/30/21 0437 05/30/21 1246 05/30/21 1329  BP: 140/61 (!) 154/75 138/72 (!) 160/69  Pulse: 98 90 93 93  Resp: '20 16 18 18  '$ Temp:  99 F (37.2 C) (!) 97.2 F (36.2 C)  98.2 F (36.8 C)  TempSrc: Oral   Oral  SpO2: 100% 100% 100% 100%  Weight:      Height:        Intake/Output Summary (Last 24 hours) at 05/30/2021 1339 Last data filed at 05/30/2021 0900 Gross per 24 hour  Intake 200 ml  Output 800 ml  Net -600 ml    Filed Weights   05/28/21 2208  Weight: 66.9 kg    Examination: General exam: Conversant, in no acute distress Respiratory system: normal chest rise, clear, no audible wheezing Cardiovascular system: regular rhythm, s1-s2 Gastrointestinal system: Nondistended, generally tender, pos BS Central nervous system: No seizures, no tremors Extremities: No cyanosis, no joint deformities Skin: No rashes, no pallor Psychiatry: Affect normal // no auditory hallucinations   Data Reviewed: I have personally reviewed following labs and imaging studies  CBC: Recent Labs  Lab 05/25/21 0732 05/28/21 1641 05/29/21 0601 05/30/21 0541  WBC 3.5* 10.2 8.7 6.8  NEUTROABS 1.7 9.1*  --   --   HGB 9.8* 10.1* 8.6* 8.8*  HCT 29.7* 30.2* 26.4* 29.0*  MCV 83.9 83.9 87.1 90.3  PLT 114* 110* 91* 98*    Basic Metabolic Panel: Recent Labs  Lab 05/25/21 0732 05/28/21 1641 05/28/21 1642 05/29/21 0601 05/30/21 0541  NA 127* 129*  --  136 142  K 3.9 3.2*  --  2.9* 3.8  CL 94* 98  --  109 114*  CO2 22 20*  --  19* 21*  GLUCOSE 93 122*  --  93 89  BUN <5* <5*  --  6* 7*  CREATININE 0.62 0.87  --  0.70 0.61  CALCIUM 8.7* 8.7*  --  7.9* 8.5*  MG  --   --  1.4*  --  2.3  PHOS  --   --  3.0  --   --     GFR: Estimated Creatinine Clearance: 66.8 mL/min (by C-G formula based on SCr of 0.61 mg/dL). Liver Function Tests: Recent Labs  Lab 05/25/21 0732 05/28/21 1641 05/30/21 0541  AST 64* 40 35  ALT 35 28 22  ALKPHOS 116 94 76  BILITOT 0.8 1.5* 0.7  PROT 7.6 7.5 6.4*  ALBUMIN 3.8 3.7 2.9*    Recent Labs  Lab 05/25/21 0732 05/28/21 1641  LIPASE 24 22    No results for input(s): AMMONIA in the  last 168 hours. Coagulation Profile: Recent Labs  Lab 05/25/21 0732  INR 1.2    Cardiac Enzymes: No results for input(s): CKTOTAL, CKMB, CKMBINDEX, TROPONINI in the last 168 hours. BNP (last 3 results) No results for input(s): PROBNP in the last 8760 hours. HbA1C: No results for input(s): HGBA1C in the last 72 hours. CBG: No results for input(s): GLUCAP in the last 168 hours. Lipid Profile: No results for input(s): CHOL, HDL, LDLCALC, TRIG,  CHOLHDL, LDLDIRECT in the last 72 hours. Thyroid Function Tests: No results for input(s): TSH, T4TOTAL, FREET4, T3FREE, THYROIDAB in the last 72 hours. Anemia Panel: No results for input(s): VITAMINB12, FOLATE, FERRITIN, TIBC, IRON, RETICCTPCT in the last 72 hours. Sepsis Labs: Recent Labs  Lab 05/25/21 1131 05/25/21 1327 05/28/21 1641 05/28/21 1830  LATICACIDVEN 4.3* 2.3* 3.3* 2.1*     Recent Results (from the past 240 hour(s))  Culture, blood (routine x 2)     Status: Abnormal (Preliminary result)   Collection Time: 05/25/21  8:33 AM   Specimen: BLOOD LEFT FOREARM  Result Value Ref Range Status   Specimen Description   Final    BLOOD LEFT FOREARM Performed at [facility], [address], [place], [place] [address]    Special Requests   Final    BOTTLES DRAWN AEROBIC AND ANAEROBIC Blood Culture results may not be optimal due to an inadequate volume of blood received in culture bottles Performed at [facility], [address], [place], [place] [address]    Culture  Setup Time   Final    GRAM POSITIVE COCCI IN BOTH AEROBIC AND ANAEROBIC BOTTLES Gram Stain Report Called to,Read Back By and Verified With: DAVIS,M (MHP) AT [id number] ON 8.9.22 BY RUCINSKI,B Performed at [facility], READ BACK BY AND VERIFIED WITH: Casandra Doffing RN 2014 05/26/21 A BROWNING    Culture (A)  Final    MICROCOCCUS LUTEUS/LYLAE THE SIGNIFICANCE OF ISOLATING THIS ORGANISM FROM A SINGLE SET OF BLOOD CULTURES WHEN MULTIPLE SETS ARE DRAWN  IS UNCERTAIN. PLEASE NOTIFY THE MICROBIOLOGY DEPARTMENT WITHIN ONE WEEK IF SPECIATION AND SENSITIVITIES ARE REQUIRED. Performed at [facility], [place] [address] [place], [place] [address]    Report Status PENDING  Incomplete  Blood Culture ID Panel (Reflexed)     Status: None   Collection Time: 05/25/21  8:33 AM  Result Value Ref Range Status   Enterococcus faecalis NOT DETECTED NOT DETECTED Final   Enterococcus Faecium NOT DETECTED NOT DETECTED Final   Listeria monocytogenes NOT DETECTED NOT DETECTED Final   Staphylococcus species NOT DETECTED NOT DETECTED Final   Staphylococcus aureus (BCID) NOT DETECTED NOT DETECTED Final   Staphylococcus epidermidis NOT DETECTED NOT DETECTED Final   Staphylococcus lugdunensis NOT DETECTED NOT DETECTED Final   Streptococcus species NOT DETECTED NOT DETECTED Final   Streptococcus agalactiae NOT DETECTED NOT DETECTED Final   Streptococcus pneumoniae NOT DETECTED NOT DETECTED Final   Streptococcus pyogenes NOT DETECTED NOT DETECTED Final   A.calcoaceticus-baumannii NOT DETECTED NOT DETECTED Final   Bacteroides fragilis NOT DETECTED NOT DETECTED Final   Enterobacterales NOT DETECTED NOT DETECTED Final   Enterobacter cloacae complex NOT DETECTED NOT DETECTED Final   Escherichia coli NOT DETECTED NOT DETECTED Final   Klebsiella aerogenes NOT DETECTED NOT DETECTED Final   Klebsiella oxytoca NOT DETECTED NOT DETECTED Final   Klebsiella pneumoniae NOT DETECTED NOT DETECTED Final   Proteus species NOT DETECTED NOT DETECTED Final   Salmonella species NOT DETECTED NOT DETECTED Final   Serratia marcescens NOT DETECTED NOT DETECTED Final   Haemophilus influenzae NOT DETECTED NOT DETECTED Final   Neisseria meningitidis NOT DETECTED NOT DETECTED Final   Pseudomonas aeruginosa NOT DETECTED NOT DETECTED Final   Stenotrophomonas maltophilia NOT DETECTED NOT DETECTED Final   Candida albicans NOT DETECTED NOT DETECTED Final   Candida auris NOT DETECTED NOT  DETECTED Final   Candida glabrata NOT DETECTED NOT DETECTED Final   Candida krusei NOT DETECTED NOT  DETECTED Final   Candida parapsilosis NOT DETECTED NOT DETECTED Final   Candida tropicalis NOT DETECTED NOT DETECTED Final   Cryptococcus neoformans/gattii NOT DETECTED NOT DETECTED Final    Comment: Performed at [facility], [place] [address], [place], [place] [address]  Culture, blood (routine x 2)     Status: None   Collection Time: 05/25/21  8:38 AM   Specimen: BLOOD LEFT HAND  Result Value Ref Range Status   Specimen Description BLOOD LEFT HAND  Final   Special Requests   Final    BOTTLES DRAWN AEROBIC AND ANAEROBIC Blood Culture adequate volume   Culture   Final    NO GROWTH 5 DAYS Performed at [facility], [address], [place], [place] [address]    Report Status 05/30/2021 FINAL  Final  Urine Culture     Status: Abnormal   Collection Time: 05/25/21  9:39 AM   Specimen: Urine, Clean Catch  Result Value Ref Range Status   Specimen Description   Final    URINE, CLEAN CATCH Performed at [facility], [address] [place], [place] [address]    Special Requests   Final    NONE Performed at [facility], [address], [place], [place] [address]    Culture MULTIPLE SPECIES PRESENT, SUGGEST RECOLLECTION (A)  Final   Report Status 05/27/2021 FINAL  Final  Resp Panel by RT-PCR (Flu A&B, Covid) Nasopharyngeal Swab     Status: None   Collection Time: 05/28/21  4:12 PM   Specimen: Nasopharyngeal Swab; Nasopharyngeal(NP) swabs in vial transport medium  Result Value Ref Range Status   SARS Coronavirus 2 by RT PCR NEGATIVE NEGATIVE Final    Comment: (NOTE) SARS-CoV-2 target nucleic acids are NOT DETECTED.  The SARS-CoV-2 RNA is generally detectable in upper respiratory specimens during the acute phase of infection. The lowest concentration of SARS-CoV-2 viral copies this assay can detect is 138 copies/mL. A negative result does not preclude SARS-Cov-2 infection and should  not be used as the sole basis for treatment or other patient management decisions. A negative result may occur with  improper specimen collection/handling, submission of specimen other than nasopharyngeal swab, presence of viral mutation(s) within the areas targeted by this assay, and inadequate number of viral copies(<138 copies/mL). A negative result must be combined with clinical observations, patient history, and epidemiological information. The expected result is Negative.  Fact Sheet for Patients:  EntrepreneurPulse.com.au  Fact Sheet for Healthcare Providers:  IncredibleEmployment.be  This test is no t yet approved or cleared by the [place] FDA and  has been authorized for detection and/or diagnosis of SARS-CoV-2 by FDA under an Emergency Use Authorization (EUA). This EUA will remain  in effect (meaning this test can be used) for the duration of the COVID-19 declaration under Section 564(b)(1) of the Act, 21 U.S.C.section 360bbb-3(b)(1), unless the authorization is terminated  or revoked sooner.       Influenza A by PCR NEGATIVE NEGATIVE Final   Influenza B by PCR NEGATIVE NEGATIVE Final    Comment: (NOTE) The Xpert Xpress SARS-CoV-2/FLU/RSV plus assay is intended as an aid in the diagnosis of influenza from Nasopharyngeal swab specimens and should not be used as a sole basis for treatment. Nasal washings and aspirates are unacceptable for Xpert Xpress SARS-CoV-2/FLU/RSV testing.  Fact Sheet for Patients: EntrepreneurPulse.com.au  Fact Sheet for Healthcare Providers: IncredibleEmployment.be  This test is not yet approved or cleared by the [place] FDA and has been authorized for detection and/or diagnosis  of SARS-CoV-2 by FDA under an Emergency Use Authorization (EUA). This EUA will remain in effect (meaning this test can be used) for the duration of the COVID-19 declaration under  Section 564(b)(1) of the Act, 21 U.S.C. section 360bbb-3(b)(1), unless the authorization is terminated or revoked.  Performed at [facility], [address], [place], [place] [address]       Radiology Studies: DG CHEST PORT 1 VIEW  Result Date: 05/28/2021 CLINICAL DATA:  Fever with chest pain EXAM: PORTABLE CHEST 1 VIEW COMPARISON:  05/25/2021 FINDINGS: The heart size and mediastinal contours are within normal limits. Both lungs are clear. Aortic atherosclerosis. The visualized skeletal structures are unremarkable. IMPRESSION: No active disease. Electronically Signed   By: Donavan Foil M.D.   On: 05/28/2021 23:52   CT Angio Abd/Pel w/ and/or w/o  Result Date: 05/29/2021 CLINICAL DATA:  Persistent generalized abdominal pain, diffuse abdominal tenderness, lactic acidosis, fever, diarrhea and vomiting. EXAM: CT ANGIOGRAPHY ABDOMEN AND PELVIS WITH CONTRAST TECHNIQUE: Multidetector CT imaging of the abdomen and pelvis was performed using the standard protocol during bolus administration of intravenous contrast. Multiplanar reconstructed images and MIPs were obtained and reviewed to evaluate the vascular anatomy. CONTRAST:  177m OMNIPAQUE IOHEXOL 350 MG/ML SOLN COMPARISON:  CT of the abdomen and pelvis on 05/25/2021 FINDINGS: VASCULAR Aorta: Normally patent without significant atherosclerosis. No evidence of aneurysm, dissection or stenosis. Celiac: Mild calcified plaque at origin without significant stenosis. Distal branch vessels are normally patent and demonstrate normal branching anatomy. SMA: Mild calcified plaque at origin without significant stenosis. Distal branch vessels are normally patent. Renals: Single right renal artery and 2 separate left renal arteries demonstrate normal patency. IMA: Normally patent. Inflow: Normally patent bilateral iliac arteries without evidence of stenosis or aneurysm. Proximal Outflow: Normally patent bilateral common femoral arteries and femoral bifurcations.  Veins: Venous phase imaging demonstrates normal patency of venous structures including the IVC, bilateral renal veins, iliac veins and common femoral veins. Visualized mesenteric veins, splenic vein and portal vein are normally patent. Review of the MIP images confirms the above findings. NON-VASCULAR Lower chest: Stable small hiatal hernia. Hepatobiliary: Stable appearance of cirrhosis of the liver without visible mass lesion. The gallbladder and bile ducts are unremarkable. Pancreas: Unremarkable. No pancreatic ductal dilatation or surrounding inflammatory changes. Spleen: Normal in size without focal abnormality. Adrenals/Urinary Tract: Adrenal glands are unremarkable. Kidneys are normal, without renal calculi, focal lesion, or hydronephrosis. Bladder is unremarkable. Stomach/Bowel: Diffuse fluid present throughout multiple nondilated small bowel loops as well as the colon. Findings are suggestive of enteritis. At the level of multiple descending colonic and sigmoid diverticula, there are some new mild surrounding inflammatory changes in the pericolonic fat especially at the level of the distal descending colon and proximal sigmoid colon. Component of mild diverticulitis is suspected. There is no evidence of focal abscess or extraluminal air. No evidence of bowel obstruction, focal mass or free intraperitoneal air. No varices identified. Lymphatic: No enlarged abdominal or pelvic lymph nodes. Reproductive: Status post hysterectomy. No adnexal masses. Other: No abdominal wall hernia or abnormality. No abdominopelvic ascites. Musculoskeletal: No acute or significant osseous findings. IMPRESSION: VASCULAR No evidence of mesenteric arterial occlusive disease. NON-VASCULAR 1. Fluid in multiple nondilated small bowel loops and the colon is suggestive of enteritis. 2. Possible additional component of diverticulitis at the level of the distal descending colon and proximal sigmoid colon due to some new surrounding  inflammatory changes in the pericolonic fat. No evidence of focal abscess or extraluminal air. 3. Stable evidence of  cirrhosis of the liver. No visible associated ascites or varices. 4. Stable small hiatal hernia. Electronically Signed   By: Aletta Edouard M.D.   On: 05/29/2021 12:10    Scheduled Meds:  enoxaparin (LOVENOX) injection  40 mg Subcutaneous [id number]   folic acid  1 mg Oral Daily   LORazepam  0-4 mg Intravenous Q6H   Followed by   Derrill Memo ON 05/31/2021] LORazepam  0-4 mg Intravenous Q12H   multivitamin with minerals  1 tablet Oral Daily   pantoprazole (PROTONIX) IV  40 mg Intravenous Q24H   PHENobarbital  32.4 mg Oral BID   phenytoin  100 mg Oral TID   thiamine  100 mg Oral Daily   Or   thiamine  100 mg Intravenous Daily   Continuous Infusions:  cefTRIAXone (ROCEPHIN)  IV 2 g (05/30/21 0115)   metronidazole 500 mg (05/30/21 0756)     LOS: 2 days   Marylu Lund, MD [facility] Pager On Amion  If 7PM-7AM, please contact night-coverage 05/30/2021, 1:39 PM

## 2021-05-31 LAB — COMPREHENSIVE METABOLIC PANEL
ALT: 20 U/L (ref 0–44)
AST: 34 U/L (ref 15–41)
Albumin: 3.2 g/dL — ABNORMAL LOW (ref 3.5–5.0)
Alkaline Phosphatase: 85 U/L (ref 38–126)
Anion gap: 9 (ref 5–15)
BUN: 5 mg/dL — ABNORMAL LOW (ref 8–23)
CO2: 21 mmol/L — ABNORMAL LOW (ref 22–32)
Calcium: 8.2 mg/dL — ABNORMAL LOW (ref 8.9–10.3)
Chloride: 107 mmol/L (ref 98–111)
Creatinine, Ser: 0.64 mg/dL (ref 0.44–1.00)
GFR, Estimated: >60 mL/min (ref >60)
Glucose, Bld: 144 mg/dL — ABNORMAL HIGH (ref 70–99)
Potassium: 3.2 mmol/L — ABNORMAL LOW (ref 3.5–5.1)
Sodium: 137 mmol/L (ref 135–145)
Total Bilirubin: 0.9 mg/dL (ref 0.3–1.2)
Total Protein: 7 g/dL (ref 6.5–8.1)

## 2021-05-31 LAB — CBC
HCT: 31.8 % — ABNORMAL LOW (ref 36.0–46.0)
Hemoglobin: 10 g/dL — ABNORMAL LOW (ref 12.0–15.0)
MCH: 28.1 pg (ref 26.0–34.0)
MCHC: 31.4 g/dL (ref 30.0–36.0)
MCV: 89.3 fL (ref 80.0–100.0)
Platelets: 109 10*3/uL — ABNORMAL LOW (ref 150–400)
RBC: 3.56 MIL/uL — ABNORMAL LOW (ref 3.87–5.11)
RDW: 19.2 % — ABNORMAL HIGH (ref 11.5–15.5)
WBC: 7.2 10*3/uL (ref 4.0–10.5)
nRBC: 0 % (ref 0.0–0.2)

## 2021-05-31 MED ORDER — POTASSIUM CHLORIDE CRYS ER 20 MEQ PO TBCR
40.0000 meq | EXTENDED_RELEASE_TABLET | Freq: Four times a day (QID) | ORAL | Status: AC
Start: 2021-05-31 — End: 2021-05-31
  Administered 2021-05-31 (×2): 40 meq via ORAL
  Filled 2021-05-31 (×2): qty 2

## 2021-05-31 MED ORDER — SODIUM CHLORIDE 0.9 % IV SOLN: INTRAVENOUS | Status: DC

## 2021-05-31 NOTE — Plan of Care (Signed)
Patient given IV antibiotics, able to get up to bsc with 1 assist.  Problem: Education: Goal: Knowledge of General Education information will improve Description: Including pain rating scale, medication(s)/side effects and non-pharmacologic comfort measures Outcome: Progressing   Problem: Health Behavior/Discharge Planning: Goal: Ability to manage health-related needs will improve Outcome: Progressing   Problem: Clinical Measurements: Goal: Ability to maintain clinical measurements within normal limits will improve Outcome: Progressing Goal: Will remain free from infection Outcome: Progressing Goal: Diagnostic test results will improve Outcome: Progressing Goal: Respiratory complications will improve Outcome: Progressing Goal: Cardiovascular complication will be avoided Outcome: Progressing   Problem: Activity: Goal: Risk for activity intolerance will decrease Outcome: Progressing   Problem: Nutrition: Goal: Adequate nutrition will be maintained Outcome: Progressing   Problem: Coping: Goal: Level of anxiety will decrease Outcome: Progressing   Problem: Elimination: Goal: Will not experience complications related to bowel motility Outcome: Progressing Goal: Will not experience complications related to urinary retention Outcome: Progressing   Problem: Pain Managment: Goal: General experience of comfort will improve Outcome: Progressing   Problem: Safety: Goal: Ability to remain free from injury will improve Outcome: Progressing   Problem: Skin Integrity: Goal: Risk for impaired skin integrity will decrease Outcome: Progressing

## 2021-05-31 NOTE — Progress Notes (Signed)
PROGRESS NOTE    [name]  [id number] DOB: 31-Dec-1955 DOA: 05/28/2021 PCP: [name], MD    Brief Narrative:  65 y.o. female with medical history significant for alcohol abuse, liver cirrhosis, stroke with right-sided deficits, seizure disorder. History is challenging due to stroke patient has abnormal speech.  Patient presented to the ED with complaints of abdominal pain of about 1 week duration.  She reports dry heaving, and multiple loose stools also.  She reports abdominal pain is worse with food.  She reports pain with urination, and change in color of her urine over the past 3 days.   Patient was in the ED 3 days ago 8/8 for abdominal pain  Assessment & Plan:   Principal Problem:   Severe sepsis ([place]) Active Problems:   Alcohol abuse   Seizure disorder ([place])   Cirrhosis of liver without ascites (H[facility])   Stroke ([facility])   Diarrhea   Lactic acidosis   Acute lower UTI  Severe Sepsis secondary to urinary tract infection-  -Presented tachycardic to 126, febrile to 102.9, tachypneic to 29-meeting severe sepsis criteria with lactic acidosis of 3.3 > 2.1, after fluid boluses.  Stable blood pressure systolic [id number].  Urine is purulent appearing, and suggestive of UTI with large leukocytes, many bacteria.  -Blood cultures with 1/2 Micrococcus luteus -IV ceftriaxone 2 g daily -Follow-up blood and urine cultures -Continue IVF hydration as tolerated   Abdominal pain, persistent lactic acidosis secondary to enteritis -CT abd/pelvis reviewed.  - History of persistent lactic acidosis, likely secondary to liver cirrhosis and alcoholism, but with abdominal pain will obtain CTA. -Morphine 2 mg as needed -on IV Protonix 40 daily -Continue Ceftriaxone 2 g daily with flagyl ordered per above -Still having diarrhea and vague abd discomfort but pt is wanting to advance diet -Cdiff was ordered overnight, pending. Will check stool culture for other pathogens -Of note, pt states  multiple other close contacts have similar symptoms   Hyponatremia  - At presentation sodium of 129.  Likely from alcohol abuse. -Normalized with IVF hydration. Cont IVF hydration   Alcohol abuse -drinks hard liquor, about 5 shots daily.  Blood alcohol level today less than 10, but was 107 just 3 days ago.  She reports last alcoholic beverage was 4 days ago prior to arrival in the ED. - Continue CIWA as needed -Thiamine folate multivitamins -cont to replace lytes as needed   Hypokalemia  -Remains low -Will replace   Seizure disorder- -reports no seizures in several years. -Resume phenobarbital, phenytoin, she reports she is on both and compliant -remains seizure free   Stroke with right-sided deficits-stable.   Liver cirrhosis-no stigmata of chronic liver disease.  Liver enzymes unremarkable. -Bili normalized    DVT prophylaxis: Lovenox subq Code Status: Full Family Communication: Pt in room, family not at bedside  Status is: Inpatient  Remains inpatient appropriate because:Inpatient level of care appropriate due to severity of illness  Dispo: The patient is from: Home              Anticipated d/c is to: Home              Patient currently is not medically stable to d/c.   Difficult to place patient No   Consultants:    Procedures:    Antimicrobials: Anti-infectives (From admission, onward)    Start     Dose/Rate Route Frequency Ordered Stop   05/29/21 1530  metroNIDAZOLE (FLAGYL) IVPB 500 mg        500  mg 100 mL/hr over 60 Minutes Intravenous Every 8 hours 05/29/21 1430     05/29/21 0015  cefTRIAXone (ROCEPHIN) 2 g in sodium chloride 0.9 % 100 mL IVPB        2 g 200 mL/hr over 30 Minutes Intravenous Every 24 hours 05/28/21 2320     05/28/21 2330  cefTRIAXone (ROCEPHIN) 1 g in sodium chloride 0.9 % 100 mL IVPB  Status:  Discontinued        1 g 200 mL/hr over 30 Minutes Intravenous Every 24 hours 05/28/21 2241 05/28/21 2320       Subjective: Asking to  advance diet  Objective: Vitals:   05/30/21 1246 05/30/21 1329 05/30/21 2110 05/31/21 0445  BP: 138/72 (!) 160/69 (!) 155/93 (!) 153/64  Pulse: 93 93 96 (!) 109  Resp: '18 18 16 18  '$ Temp:  98.2 F (36.8 C) 98.9 F (37.2 C) 98.6 F (37 C)  TempSrc:  Oral Oral Oral  SpO2: 100% 100% 100% 100%  Weight:      Height:        Intake/Output Summary (Last 24 hours) at 05/31/2021 1202 Last data filed at 05/31/2021 0900 Gross per 24 hour  Intake 1320 ml  Output 300 ml  Net 1020 ml    Filed Weights   05/28/21 2208  Weight: 66.9 kg    Examination: General exam: Awake, laying in bed, in nad Respiratory system: Normal respiratory effort, no wheezing Cardiovascular system: regular rate, s1, s2 Gastrointestinal system: Soft, pos BS Central nervous system: CN2-12 grossly intact, strength intact Extremities: Perfused, no clubbing Skin: Normal skin turgor, no notable skin lesions seen Psychiatry: Mood normal // no visual hallucinations   Data Reviewed: I have personally reviewed following labs and imaging studies  CBC: Recent Labs  Lab 05/25/21 0732 05/28/21 1641 05/29/21 0601 05/30/21 0541 05/31/21 0542  WBC 3.5* 10.2 8.7 6.8 7.2  NEUTROABS 1.7 9.1*  --   --   --   HGB 9.8* 10.1* 8.6* 8.8* 10.0*  HCT 29.7* 30.2* 26.4* 29.0* 31.8*  MCV 83.9 83.9 87.1 90.3 89.3  PLT 114* 110* 91* 98* 109*    Basic Metabolic Panel: Recent Labs  Lab 05/25/21 0732 05/28/21 1641 05/28/21 1642 05/29/21 0601 05/30/21 0541 05/31/21 0542  NA 127* 129*  --  136 142 137  K 3.9 3.2*  --  2.9* 3.8 3.2*  CL 94* 98  --  109 114* 107  CO2 22 20*  --  19* 21* 21*  GLUCOSE 93 122*  --  93 89 144*  BUN <5* <5*  --  6* 7* 5*  CREATININE 0.62 0.87  --  0.70 0.61 0.64  CALCIUM 8.7* 8.7*  --  7.9* 8.5* 8.2*  MG  --   --  1.4*  --  2.3  --   PHOS  --   --  3.0  --   --   --     GFR: Estimated Creatinine Clearance: 66.8 mL/min (by C-G formula based on SCr of 0.64 mg/dL). Liver Function Tests: Recent  Labs  Lab 05/25/21 0732 05/28/21 1641 05/30/21 0541 05/31/21 0542  AST 64* 40 35 34  ALT 35 '28 22 20  '$ ALKPHOS 116 94 76 85  BILITOT 0.8 1.5* 0.7 0.9  PROT 7.6 7.5 6.4* 7.0  ALBUMIN 3.8 3.7 2.9* 3.2*    Recent Labs  Lab 05/25/21 0732 05/28/21 1641  LIPASE 24 22    No results for input(s): AMMONIA in the last 168 hours. Coagulation Profile: Recent  Labs  Lab 05/25/21 0732  INR 1.2    Cardiac Enzymes: No results for input(s): CKTOTAL, CKMB, CKMBINDEX, TROPONINI in the last 168 hours. BNP (last 3 results) No results for input(s): PROBNP in the last 8760 hours. HbA1C: No results for input(s): HGBA1C in the last 72 hours. CBG: No results for input(s): GLUCAP in the last 168 hours. Lipid Profile: No results for input(s): CHOL, HDL, LDLCALC, TRIG, CHOLHDL, LDLDIRECT in the last 72 hours. Thyroid Function Tests: No results for input(s): TSH, T4TOTAL, FREET4, T3FREE, THYROIDAB in the last 72 hours. Anemia Panel: No results for input(s): VITAMINB12, FOLATE, FERRITIN, TIBC, IRON, RETICCTPCT in the last 72 hours. Sepsis Labs: Recent Labs  Lab 05/25/21 1131 05/25/21 1327 05/28/21 1641 05/28/21 1830  LATICACIDVEN 4.3* 2.3* 3.3* 2.1*     Recent Results (from the past 240 hour(s))  Culture, blood (routine x 2)     Status: Abnormal (Preliminary result)   Collection Time: 05/25/21  8:33 AM   Specimen: BLOOD LEFT FOREARM  Result Value Ref Range Status   Specimen Description   Final    BLOOD LEFT FOREARM Performed at [facility], [address] [place], [place] [address]    Special Requests   Final    BOTTLES DRAWN AEROBIC AND ANAEROBIC Blood Culture results may not be optimal due to an inadequate volume of blood received in culture bottles Performed at [facility], [address], [place], [place] [address]    Culture  Setup Time   Final    GRAM POSITIVE COCCI IN BOTH AEROBIC AND ANAEROBIC BOTTLES Gram Stain Report Called to,Read Back By and Verified With: DAVIS,M  (MHP) AT [id number] ON 8.9.22 BY RUCINSKI,B Performed at [place], READ BACK BY AND VERIFIED WITH: Casandra Doffing RN 2014 05/26/21 A BROWNING    Culture (A)  Final    MICROCOCCUS LUTEUS/LYLAE THE SIGNIFICANCE OF ISOLATING THIS ORGANISM FROM A SINGLE SET OF BLOOD CULTURES WHEN MULTIPLE SETS ARE DRAWN IS UNCERTAIN. PLEASE NOTIFY THE MICROBIOLOGY DEPARTMENT WITHIN ONE WEEK IF SPECIATION AND SENSITIVITIES ARE REQUIRED. Performed at [facility], [place] [address], [place], [place] [address]    Report Status PENDING  Incomplete  Blood Culture ID Panel (Reflexed)     Status: None   Collection Time: 05/25/21  8:33 AM  Result Value Ref Range Status   Enterococcus faecalis NOT DETECTED NOT DETECTED Final   Enterococcus Faecium NOT DETECTED NOT DETECTED Final   Listeria monocytogenes NOT DETECTED NOT DETECTED Final   Staphylococcus species NOT DETECTED NOT DETECTED Final   Staphylococcus aureus (BCID) NOT DETECTED NOT DETECTED Final   Staphylococcus epidermidis NOT DETECTED NOT DETECTED Final   Staphylococcus lugdunensis NOT DETECTED NOT DETECTED Final   Streptococcus species NOT DETECTED NOT DETECTED Final   Streptococcus agalactiae NOT DETECTED NOT DETECTED Final   Streptococcus pneumoniae NOT DETECTED NOT DETECTED Final   Streptococcus pyogenes NOT DETECTED NOT DETECTED Final   A.calcoaceticus-baumannii NOT DETECTED NOT DETECTED Final   Bacteroides fragilis NOT DETECTED NOT DETECTED Final   Enterobacterales NOT DETECTED NOT DETECTED Final   Enterobacter cloacae complex NOT DETECTED NOT DETECTED Final   Escherichia coli NOT DETECTED NOT DETECTED Final   Klebsiella aerogenes NOT DETECTED NOT DETECTED Final   Klebsiella oxytoca NOT DETECTED NOT DETECTED Final   Klebsiella pneumoniae NOT DETECTED NOT DETECTED Final   Proteus species NOT DETECTED NOT DETECTED Final   Salmonella species NOT DETECTED NOT DETECTED Final   Serratia marcescens NOT DETECTED NOT DETECTED  Final    Haemophilus influenzae NOT DETECTED NOT DETECTED Final   Neisseria meningitidis NOT DETECTED NOT DETECTED Final   Pseudomonas aeruginosa NOT DETECTED NOT DETECTED Final   Stenotrophomonas maltophilia NOT DETECTED NOT DETECTED Final   Candida albicans NOT DETECTED NOT DETECTED Final   Candida auris NOT DETECTED NOT DETECTED Final   Candida glabrata NOT DETECTED NOT DETECTED Final   Candida krusei NOT DETECTED NOT DETECTED Final   Candida parapsilosis NOT DETECTED NOT DETECTED Final   Candida tropicalis NOT DETECTED NOT DETECTED Final   Cryptococcus neoformans/gattii NOT DETECTED NOT DETECTED Final    Comment: Performed at [facility], [place] [address], [place], [place] [address]  Culture, blood (routine x 2)     Status: None   Collection Time: 05/25/21  8:38 AM   Specimen: BLOOD LEFT HAND  Result Value Ref Range Status   Specimen Description BLOOD LEFT HAND  Final   Special Requests   Final    BOTTLES DRAWN AEROBIC AND ANAEROBIC Blood Culture adequate volume   Culture   Final    NO GROWTH 5 DAYS Performed at [facility], [address] [place], [place] [address]    Report Status 05/30/2021 FINAL  Final  Urine Culture     Status: Abnormal   Collection Time: 05/25/21  9:39 AM   Specimen: Urine, Clean Catch  Result Value Ref Range Status   Specimen Description   Final    URINE, CLEAN CATCH Performed at [facility], [address] [place], [place] [address]    Special Requests   Final    NONE Performed at [facility], [address] [place], [place] [address]    Culture MULTIPLE SPECIES PRESENT, SUGGEST RECOLLECTION (A)  Final   Report Status 05/27/2021 FINAL  Final  Resp Panel by RT-PCR (Flu A&B, Covid) Nasopharyngeal Swab     Status: None   Collection Time: 05/28/21  4:12 PM   Specimen: Nasopharyngeal Swab; Nasopharyngeal(NP) swabs in vial transport medium  Result Value Ref Range Status   SARS Coronavirus 2 by RT PCR NEGATIVE NEGATIVE Final    Comment:  (NOTE) SARS-CoV-2 target nucleic acids are NOT DETECTED.  The SARS-CoV-2 RNA is generally detectable in upper respiratory specimens during the acute phase of infection. The lowest concentration of SARS-CoV-2 viral copies this assay can detect is 138 copies/mL. A negative result does not preclude SARS-Cov-2 infection and should not be used as the sole basis for treatment or other patient management decisions. A negative result may occur with  improper specimen collection/handling, submission of specimen other than nasopharyngeal swab, presence of viral mutation(s) within the areas targeted by this assay, and inadequate number of viral copies(<138 copies/mL). A negative result must be combined with clinical observations, patient history, and epidemiological information. The expected result is Negative.  Fact Sheet for Patients:  EntrepreneurPulse.com.au  Fact Sheet for Healthcare Providers:  IncredibleEmployment.be  This test is no t yet approved or cleared by the [place] FDA and  has been authorized for detection and/or diagnosis of SARS-CoV-2 by FDA under an Emergency Use Authorization (EUA). This EUA will remain  in effect (meaning this test can be used) for the duration of the COVID-19 declaration under Section 564(b)(1) of the Act, 21 U.S.C.section 360bbb-3(b)(1), unless the authorization is terminated  or revoked sooner.       Influenza A by PCR NEGATIVE NEGATIVE Final   Influenza B by PCR NEGATIVE NEGATIVE Final    Comment: (NOTE) The Xpert Xpress SARS-CoV-2/FLU/RSV plus  assay is intended as an aid in the diagnosis of influenza from Nasopharyngeal swab specimens and should not be used as a sole basis for treatment. Nasal washings and aspirates are unacceptable for Xpert Xpress SARS-CoV-2/FLU/RSV testing.  Fact Sheet for Patients: EntrepreneurPulse.com.au  Fact Sheet for Healthcare  Providers: IncredibleEmployment.be  This test is not yet approved or cleared by the [place] FDA and has been authorized for detection and/or diagnosis of SARS-CoV-2 by FDA under an Emergency Use Authorization (EUA). This EUA will remain in effect (meaning this test can be used) for the duration of the COVID-19 declaration under Section 564(b)(1) of the Act, 21 U.S.C. section 360bbb-3(b)(1), unless the authorization is terminated or revoked.  Performed at [facility], [address], [place], [place] [address]       Radiology Studies: No results found.  Scheduled Meds:  enoxaparin (LOVENOX) injection  40 mg Subcutaneous [id number]   folic acid  1 mg Oral Daily   LORazepam  0-4 mg Intravenous Q12H   multivitamin with minerals  1 tablet Oral Daily   pantoprazole (PROTONIX) IV  40 mg Intravenous Q24H   PHENobarbital  32.4 mg Oral BID   phenytoin  100 mg Oral TID   potassium chloride  40 mEq Oral Q6H   thiamine  100 mg Oral Daily   Or   thiamine  100 mg Intravenous Daily   Continuous Infusions:  cefTRIAXone (ROCEPHIN)  IV 2 g (05/30/21 2354)   metronidazole 500 mg (05/31/21 [id number])     LOS: 3 days   Marylu Lund, MD [facility] Pager On Amion  If 7PM-7AM, please contact night-coverage 05/31/2021, 12:02 PM

## 2021-06-01 LAB — MAGNESIUM: Magnesium: 1.6 mg/dL — ABNORMAL LOW (ref 1.7–2.4)

## 2021-06-01 LAB — GASTROINTESTINAL PANEL BY PCR, STOOL (REPLACES STOOL CULTURE)

## 2021-06-01 LAB — COMPREHENSIVE METABOLIC PANEL
ALT: 18 U/L (ref 0–44)
AST: 27 U/L (ref 15–41)
Albumin: 2.9 g/dL — ABNORMAL LOW (ref 3.5–5.0)
Alkaline Phosphatase: 78 U/L (ref 38–126)
Anion gap: 10 (ref 5–15)
BUN: <5 mg/dL — ABNORMAL LOW (ref 8–23)
CO2: 20 mmol/L — ABNORMAL LOW (ref 22–32)
Calcium: 8.4 mg/dL — ABNORMAL LOW (ref 8.9–10.3)
Chloride: 106 mmol/L (ref 98–111)
Creatinine, Ser: 0.49 mg/dL (ref 0.44–1.00)
GFR, Estimated: >60 mL/min (ref >60)
Glucose, Bld: 110 mg/dL — ABNORMAL HIGH (ref 70–99)
Potassium: 3.1 mmol/L — ABNORMAL LOW (ref 3.5–5.1)
Sodium: 136 mmol/L (ref 135–145)
Total Bilirubin: 0.7 mg/dL (ref 0.3–1.2)
Total Protein: 6.3 g/dL — ABNORMAL LOW (ref 6.5–8.1)

## 2021-06-01 LAB — CULTURE, BLOOD (ROUTINE X 2)

## 2021-06-01 NOTE — Plan of Care (Signed)

## 2021-06-01 NOTE — Progress Notes (Signed)
PROGRESS NOTE    [name]  [id number] DOB: 07-24-1956 DOA: 05/28/2021 PCP: [name], MD    Brief Narrative:  65 y.o. female with medical history significant for alcohol abuse, liver cirrhosis, stroke with right-sided deficits, seizure disorder. History is challenging due to stroke patient has abnormal speech.  Patient presented to the ED with complaints of abdominal pain of about 1 week duration.  She reports dry heaving, and multiple loose stools also.  She reports abdominal pain is worse with food.  She reports pain with urination, and change in color of her urine over the past 3 days.   Patient was in the ED 3 days ago 8/8 for abdominal pain  Assessment & Plan:   Principal Problem:   Severe sepsis ([place]) Active Problems:   Alcohol abuse   Seizure disorder ([place])   Cirrhosis of liver without ascites ([facility])   Stroke ([facility])   Diarrhea   Lactic acidosis   Acute lower UTI  Severe Sepsis secondary to urinary tract infection-  -Presented tachycardic to 126, febrile to 102.9, tachypneic to 29-meeting severe sepsis criteria with lactic acidosis of 3.3 > 2.1, after fluid boluses.  Stable blood pressure systolic [id number].  Urine is purulent appearing, and suggestive of UTI with large leukocytes, many bacteria.  -Blood cultures with 1/2 Micrococcus luteus, suspect contaminant -Currently on IV ceftriaxone 2 g daily -Blood and urine cultures are unremarkable -Clinically improving   Abdominal pain, persistent lactic acidosis secondary to enteritis -CT abd/pelvis reviewed.  - History of persistent lactic acidosis, likely secondary to liver cirrhosis and alcoholism, but with abdominal pain will obtain CTA. -Morphine 2 mg as needed -IV Protonix 40 daily -Currently on Ceftriaxone 2 g daily with flagyl ordered -Pt reports feeling better. Wanting to try something to eat   Hyponatremia  - At presentation sodium of 129.  Likely from alcohol abuse. -Normalized with IVF hydration    Alcohol abuse -drinks hard liquor, about 5 shots daily.  Blood alcohol level today less than 10, but was 107 just 3 days ago.  She reports last alcoholic beverage was 4 days ago prior to arrival in the ED. - Continue CIWA as needed, thus far without evidence of withdrawals -Thiamine folate multivitamins -Check magnesium, phosphorus   Hypokalemia potassium 3.2 likely from alcohol abuse. -  replaced   Seizure disorder- -reports no seizures in several years. -Resume phenobarbital, phenytoin, she reports she is on both and compliant -Stable thus far   Stroke with right-sided deficits-stable.   Liver cirrhosis-no stigmata of chronic liver disease.  Liver enzymes unremarkable. -Bili normalized  -Cont to follow   DVT prophylaxis: Lovenox subq Code Status: Full Family Communication: Pt in room, family not at bedside  Status is: Inpatient  Remains inpatient appropriate because:Inpatient level of care appropriate due to severity of illness  Dispo: The patient is from: Home              Anticipated d/c is to: Home              Patient currently is not medically stable to d/c.   Difficult to place patient No   Consultants:    Procedures:    Antimicrobials: Anti-infectives (From admission, onward)    Start     Dose/Rate Route Frequency Ordered Stop   05/29/21 1530  metroNIDAZOLE (FLAGYL) IVPB 500 mg        500 mg 100 mL/hr over 60 Minutes Intravenous Every 8 hours 05/29/21 1430     05/29/21 0015  cefTRIAXone (  ROCEPHIN) 2 g in sodium chloride 0.9 % 100 mL IVPB        2 g 200 mL/hr over 30 Minutes Intravenous Every 24 hours 05/28/21 2320     05/28/21 2330  cefTRIAXone (ROCEPHIN) 1 g in sodium chloride 0.9 % 100 mL IVPB  Status:  Discontinued        1 g 200 mL/hr over 30 Minutes Intravenous Every 24 hours 05/28/21 2241 05/28/21 2320       Subjective: Reports feeling better today  Objective: Vitals:   05/31/21 0445 05/31/21 2129 06/01/21 0417 06/01/21 1424  BP: (!)  153/64 (!) 149/66 (!) 147/60 (!) 128/58  Pulse: (!) 109 80 89 87  Resp: '18 17 16 18  '$ Temp: 98.6 F (37 C) 97.7 F (36.5 C) 98.1 F (36.7 C) 98.5 F (36.9 C)  TempSrc: Oral Oral Oral Oral  SpO2: 100% 100% 100% 100%  Weight:      Height:        Intake/Output Summary (Last 24 hours) at 06/01/2021 1456 Last data filed at 06/01/2021 [id number] Gross per 24 hour  Intake 831.24 ml  Output --  Net 831.24 ml    Filed Weights   05/28/21 2208  Weight: 66.9 kg    Examination: General exam: Awake, laying in bed, in nad Respiratory system: Normal respiratory effort, no wheezing Cardiovascular system: regular rate, s1, s2 Gastrointestinal system: Soft, nondistended, positive BS Central nervous system: CN2-12 grossly intact, strength intact Extremities: Perfused, no clubbing Skin: Normal skin turgor, no notable skin lesions seen Psychiatry: Mood normal // no visual hallucinations   Data Reviewed: I have personally reviewed following labs and imaging studies  CBC: Recent Labs  Lab 05/28/21 1641 05/29/21 0601 05/30/21 0541 05/31/21 0542  WBC 10.2 8.7 6.8 7.2  NEUTROABS 9.1*  --   --   --   HGB 10.1* 8.6* 8.8* 10.0*  HCT 30.2* 26.4* 29.0* 31.8*  MCV 83.9 87.1 90.3 89.3  PLT 110* 91* 98* 109*    Basic Metabolic Panel: Recent Labs  Lab 05/28/21 1641 05/28/21 1642 05/29/21 0601 05/30/21 0541 05/31/21 0542 06/01/21 0804  NA 129*  --  136 142 137 136  K 3.2*  --  2.9* 3.8 3.2* 3.1*  CL 98  --  109 114* 107 106  CO2 20*  --  19* 21* 21* 20*  GLUCOSE 122*  --  93 89 144* 110*  BUN <5*  --  6* 7* 5* <5*  CREATININE 0.87  --  0.70 0.61 0.64 0.49  CALCIUM 8.7*  --  7.9* 8.5* 8.2* 8.4*  MG  --  1.4*  --  2.3  --  1.6*  PHOS  --  3.0  --   --   --   --     GFR: Estimated Creatinine Clearance: 66.8 mL/min (by C-G formula based on SCr of 0.49 mg/dL). Liver Function Tests: Recent Labs  Lab 05/28/21 1641 05/30/21 0541 05/31/21 0542 06/01/21 0804  AST 40 35 34 27  ALT '28 22  20 18  '$ ALKPHOS 94 76 85 78  BILITOT 1.5* 0.7 0.9 0.7  PROT 7.5 6.4* 7.0 6.3*  ALBUMIN 3.7 2.9* 3.2* 2.9*    Recent Labs  Lab 05/28/21 1641  LIPASE 22    No results for input(s): AMMONIA in the last 168 hours. Coagulation Profile: No results for input(s): INR, PROTIME in the last 168 hours.  Cardiac Enzymes: No results for input(s): CKTOTAL, CKMB, CKMBINDEX, TROPONINI in the last 168 hours. BNP (last 3  results) No results for input(s): PROBNP in the last 8760 hours. HbA1C: No results for input(s): HGBA1C in the last 72 hours. CBG: No results for input(s): GLUCAP in the last 168 hours. Lipid Profile: No results for input(s): CHOL, HDL, LDLCALC, TRIG, CHOLHDL, LDLDIRECT in the last 72 hours. Thyroid Function Tests: No results for input(s): TSH, T4TOTAL, FREET4, T3FREE, THYROIDAB in the last 72 hours. Anemia Panel: No results for input(s): VITAMINB12, FOLATE, FERRITIN, TIBC, IRON, RETICCTPCT in the last 72 hours. Sepsis Labs: Recent Labs  Lab 05/28/21 1641 05/28/21 1830  LATICACIDVEN 3.3* 2.1*     Recent Results (from the past 240 hour(s))  Culture, blood (routine x 2)     Status: Abnormal   Collection Time: 05/25/21  8:33 AM   Specimen: BLOOD LEFT FOREARM  Result Value Ref Range Status   Specimen Description   Final    BLOOD LEFT FOREARM Performed at  [facility], [address] [place], [place] [address]    Special Requests   Final    BOTTLES DRAWN AEROBIC AND ANAEROBIC Blood Culture results may not be optimal due to an inadequate volume of blood received in culture bottles Performed at [facility], [address] [place], [place] [address]    Culture  Setup Time   Final    GRAM POSITIVE COCCI IN BOTH AEROBIC AND ANAEROBIC BOTTLES Gram Stain Report Called to,Read Back By and Verified With: DAVIS,M (MHP) AT [id number] ON 8.9.22 BY RUCINSKI,B Performed at [facility], READ BACK BY AND VERIFIED WITH: Casandra Doffing RN 2014 05/26/21 A BROWNING     Culture (A)  Final    MICROCOCCUS LUTEUS/LYLAE THE SIGNIFICANCE OF ISOLATING THIS ORGANISM FROM A SINGLE SET OF BLOOD CULTURES WHEN MULTIPLE SETS ARE DRAWN IS UNCERTAIN. PLEASE NOTIFY THE MICROBIOLOGY DEPARTMENT WITHIN ONE WEEK IF SPECIATION AND SENSITIVITIES ARE REQUIRED. Performed at [facility], [place] [address] [place], [place] [address]    Report Status 06/01/2021 FINAL  Final  Blood Culture ID Panel (Reflexed)     Status: None   Collection Time: 05/25/21  8:33 AM  Result Value Ref Range Status   Enterococcus faecalis NOT DETECTED NOT DETECTED Final   Enterococcus Faecium NOT DETECTED NOT DETECTED Final   Listeria monocytogenes NOT DETECTED NOT DETECTED Final   Staphylococcus species NOT DETECTED NOT DETECTED Final   Staphylococcus aureus (BCID) NOT DETECTED NOT DETECTED Final   Staphylococcus epidermidis NOT DETECTED NOT DETECTED Final   Staphylococcus lugdunensis NOT DETECTED NOT DETECTED Final   Streptococcus species NOT DETECTED NOT DETECTED Final   Streptococcus agalactiae NOT DETECTED NOT DETECTED Final   Streptococcus pneumoniae NOT DETECTED NOT DETECTED Final   Streptococcus pyogenes NOT DETECTED NOT DETECTED Final   A.calcoaceticus-baumannii NOT DETECTED NOT DETECTED Final   Bacteroides fragilis NOT DETECTED NOT DETECTED Final   Enterobacterales NOT DETECTED NOT DETECTED Final   Enterobacter cloacae complex NOT DETECTED NOT DETECTED Final   Escherichia coli NOT DETECTED NOT DETECTED Final   Klebsiella aerogenes NOT DETECTED NOT DETECTED Final   Klebsiella oxytoca NOT DETECTED NOT DETECTED Final   Klebsiella pneumoniae NOT DETECTED NOT DETECTED Final   Proteus species NOT DETECTED NOT DETECTED Final   Salmonella species NOT DETECTED NOT DETECTED Final   Serratia marcescens NOT DETECTED NOT DETECTED Final   Haemophilus influenzae NOT DETECTED NOT DETECTED Final   Neisseria meningitidis NOT DETECTED NOT DETECTED Final   Pseudomonas aeruginosa NOT DETECTED NOT  DETECTED Final   Stenotrophomonas maltophilia NOT DETECTED NOT  DETECTED Final   Candida albicans NOT DETECTED NOT DETECTED Final   Candida auris NOT DETECTED NOT DETECTED Final   Candida glabrata NOT DETECTED NOT DETECTED Final   Candida krusei NOT DETECTED NOT DETECTED Final   Candida parapsilosis NOT DETECTED NOT DETECTED Final   Candida tropicalis NOT DETECTED NOT DETECTED Final   Cryptococcus neoformans/gattii NOT DETECTED NOT DETECTED Final    Comment: Performed at [facility], [place] [address], [place], [place] [address]  Culture, blood (routine x 2)     Status: None   Collection Time: 05/25/21  8:38 AM   Specimen: BLOOD LEFT HAND  Result Value Ref Range Status   Specimen Description BLOOD LEFT HAND  Final   Special Requests   Final    BOTTLES DRAWN AEROBIC AND ANAEROBIC Blood Culture adequate volume   Culture   Final    NO GROWTH 5 DAYS Performed at [facility], [address], [place], [place] [address]    Report Status 05/30/2021 FINAL  Final  Urine Culture     Status: Abnormal   Collection Time: 05/25/21  9:39 AM   Specimen: Urine, Clean Catch  Result Value Ref Range Status   Specimen Description   Final    URINE, CLEAN CATCH Performed at [facility], [address] [place], [place] [address]    Special Requests   Final    NONE Performed at [facility], [address], [place], [place] [address]    Culture MULTIPLE SPECIES PRESENT, SUGGEST RECOLLECTION (A)  Final   Report Status 05/27/2021 FINAL  Final  Resp Panel by RT-PCR (Flu A&B, Covid) Nasopharyngeal Swab     Status: None   Collection Time: 05/28/21  4:12 PM   Specimen: Nasopharyngeal Swab; Nasopharyngeal(NP) swabs in vial transport medium  Result Value Ref Range Status   SARS Coronavirus 2 by RT PCR NEGATIVE NEGATIVE Final    Comment: (NOTE) SARS-CoV-2 target nucleic acids are NOT DETECTED.  The SARS-CoV-2 RNA is generally detectable in upper respiratory specimens during the acute phase of infection.  The lowest concentration of SARS-CoV-2 viral copies this assay can detect is 138 copies/mL. A negative result does not preclude SARS-Cov-2 infection and should not be used as the sole basis for treatment or other patient management decisions. A negative result may occur with  improper specimen collection/handling, submission of specimen other than nasopharyngeal swab, presence of viral mutation(s) within the areas targeted by this assay, and inadequate number of viral copies(<138 copies/mL). A negative result must be combined with clinical observations, patient history, and epidemiological information. The expected result is Negative.  Fact Sheet for Patients:  EntrepreneurPulse.com.au  Fact Sheet for Healthcare Providers:  IncredibleEmployment.be  This test is no t yet approved or cleared by the [place] FDA and  has been authorized for detection and/or diagnosis of SARS-CoV-2 by FDA under an Emergency Use Authorization (EUA). This EUA will remain  in effect (meaning this test can be used) for the duration of the COVID-19 declaration under Section 564(b)(1) of the Act, 21 U.S.C.section 360bbb-3(b)(1), unless the authorization is terminated  or revoked sooner.       Influenza A by PCR NEGATIVE NEGATIVE Final   Influenza B by PCR NEGATIVE NEGATIVE Final    Comment: (NOTE) The Xpert Xpress SARS-CoV-2/FLU/RSV plus assay is intended as an aid in the diagnosis of influenza from Nasopharyngeal swab specimens and should not be used as a sole basis for treatment. Nasal washings and aspirates are unacceptable for Xpert Xpress SARS-CoV-2/FLU/RSV  testing.  Fact Sheet for Patients: EntrepreneurPulse.com.au  Fact Sheet for Healthcare Providers: IncredibleEmployment.be  This test is not yet approved or cleared by the [place] FDA and has been authorized for detection and/or diagnosis of SARS-CoV-2 by FDA  under an Emergency Use Authorization (EUA). This EUA will remain in effect (meaning this test can be used) for the duration of the COVID-19 declaration under Section 564(b)(1) of the Act, 21 U.S.C. section 360bbb-3(b)(1), unless the authorization is terminated or revoked.  Performed at [facility], [address], [place], [place] [address]   Gastrointestinal Panel by PCR , Stool     Status: None   Collection Time: 05/31/21 10:16 AM   Specimen: Stool  Result Value Ref Range Status   Campylobacter species NOT DETECTED NOT DETECTED Final   Plesimonas shigelloides NOT DETECTED NOT DETECTED Final   Salmonella species NOT DETECTED NOT DETECTED Final   Yersinia enterocolitica NOT DETECTED NOT DETECTED Final   Vibrio species NOT DETECTED NOT DETECTED Final   Vibrio cholerae NOT DETECTED NOT DETECTED Final   Enteroaggregative E coli (EAEC) NOT DETECTED NOT DETECTED Final   Enteropathogenic E coli (EPEC) NOT DETECTED NOT DETECTED Final   Enterotoxigenic E coli (ETEC) NOT DETECTED NOT DETECTED Final   Shiga like toxin producing E coli (STEC) NOT DETECTED NOT DETECTED Final   Shigella/Enteroinvasive E coli (EIEC) NOT DETECTED NOT DETECTED Final   Cryptosporidium NOT DETECTED NOT DETECTED Final   Cyclospora cayetanensis NOT DETECTED NOT DETECTED Final   Entamoeba histolytica NOT DETECTED NOT DETECTED Final   Giardia lamblia NOT DETECTED NOT DETECTED Final   Adenovirus F40/41 NOT DETECTED NOT DETECTED Final   Astrovirus NOT DETECTED NOT DETECTED Final   Norovirus GI/GII NOT DETECTED NOT DETECTED Final   Rotavirus A NOT DETECTED NOT DETECTED Final   Sapovirus (I, II, IV, and V) NOT DETECTED NOT DETECTED Final    Comment: Performed at [facility], [address], [place], [place] [address]      Radiology Studies: No results found.  Scheduled Meds:  enoxaparin (LOVENOX) injection  40 mg Subcutaneous [id number]   folic acid  1 mg Oral Daily   LORazepam  0-4 mg Intravenous Q12H    multivitamin with minerals  1 tablet Oral Daily   pantoprazole (PROTONIX) IV  40 mg Intravenous Q24H   PHENobarbital  32.4 mg Oral BID   phenytoin  100 mg Oral TID   thiamine  100 mg Oral Daily   Or   thiamine  100 mg Intravenous Daily   Continuous Infusions:  sodium chloride 75 mL/hr at 06/01/21 0420   cefTRIAXone (ROCEPHIN)  IV 2 g (06/01/21 0107)   metronidazole 500 mg (05/31/21 2349)     LOS: 4 days   Marylu Lund, MD [facility] Pager On Amion  If 7PM-7AM, please contact night-coverage 06/01/2021, 2:56 PM

## 2021-06-02 ENCOUNTER — Ambulatory Visit: Payer: Medicare Other | Admitting: Gastroenterology

## 2021-06-02 LAB — COMPREHENSIVE METABOLIC PANEL
ALT: 16 U/L (ref 0–44)
AST: 24 U/L (ref 15–41)
Albumin: 2.3 g/dL — ABNORMAL LOW (ref 3.5–5.0)
Alkaline Phosphatase: 63 U/L (ref 38–126)
Anion gap: 6 (ref 5–15)
BUN: <5 mg/dL — ABNORMAL LOW (ref 8–23)
CO2: 19 mmol/L — ABNORMAL LOW (ref 22–32)
Calcium: 6.9 mg/dL — ABNORMAL LOW (ref 8.9–10.3)
Chloride: 115 mmol/L — ABNORMAL HIGH (ref 98–111)
Creatinine, Ser: 0.39 mg/dL — ABNORMAL LOW (ref 0.44–1.00)
GFR, Estimated: >60 mL/min (ref >60)
Glucose, Bld: 76 mg/dL (ref 70–99)
Potassium: 2.5 mmol/L — CL (ref 3.5–5.1)
Sodium: 140 mmol/L (ref 135–145)
Total Bilirubin: 0.4 mg/dL (ref 0.3–1.2)
Total Protein: 5 g/dL — ABNORMAL LOW (ref 6.5–8.1)

## 2021-06-02 LAB — MAGNESIUM: Magnesium: 1.3 mg/dL — ABNORMAL LOW (ref 1.7–2.4)

## 2021-06-02 MED ORDER — LOPERAMIDE HCL 2 MG PO CAPS: 2.0000 mg | ORAL_CAPSULE | ORAL | Status: DC | PRN

## 2021-06-02 MED ORDER — POTASSIUM CHLORIDE 10 MEQ/100ML IV SOLN
10.0000 meq | INTRAVENOUS | Status: AC
  Administered 2021-06-02 (×3): 10 meq via INTRAVENOUS
  Filled 2021-06-02: qty 100

## 2021-06-02 MED ORDER — POTASSIUM CHLORIDE CRYS ER 20 MEQ PO TBCR
20.0000 meq | EXTENDED_RELEASE_TABLET | Freq: Three times a day (TID) | ORAL | Status: AC
  Administered 2021-06-02 (×3): 20 meq via ORAL
  Filled 2021-06-02 (×3): qty 1

## 2021-06-02 MED ORDER — MAGNESIUM SULFATE 2 GM/50ML IV SOLN
2.0000 g | Freq: Once | INTRAVENOUS | Status: AC
  Administered 2021-06-02: 2 g via INTRAVENOUS
  Filled 2021-06-02: qty 50

## 2021-06-02 NOTE — Progress Notes (Signed)
PROGRESS NOTE    [name]  [id number] DOB: 1956-05-11 DOA: 05/28/2021 PCP: [name], MD    Brief Narrative:  65 y.o. female with medical history significant for alcohol abuse, liver cirrhosis, stroke with right-sided deficits, seizure disorder. History is challenging due to stroke patient has abnormal speech.  Patient presented to the ED with complaints of abdominal pain of about 1 week duration.  She reports dry heaving, and multiple loose stools also.  She reports abdominal pain is worse with food.  She reports pain with urination, and change in color of her urine over the past 3 days.   Patient was in the ED 3 days ago 8/8 for abdominal pain  Assessment & Plan:   Principal Problem:   Severe sepsis ([place]) Active Problems:   Alcohol abuse   Seizure disorder ([place])   Cirrhosis of liver without ascites ([facility])   Stroke ([facility])   Diarrhea   Lactic acidosis   Acute lower UTI  Severe Sepsis secondary to urinary tract infection-  -Presented tachycardic to 126, febrile to 102.9, tachypneic to 29-meeting severe sepsis criteria with lactic acidosis of 3.3 > 2.1, after fluid boluses.  Stable blood pressure systolic [id number].  Urine is purulent appearing, and suggestive of UTI with large leukocytes, many bacteria.  -Blood cultures with 1/2 Micrococcus luteus, suspect contaminant -Currently on IV ceftriaxone 2 g daily -Blood and urine cultures are unremarkable -Clinically improving however pt continues with multiple BM daily   Abdominal pain, persistent lactic acidosis secondary to enteritis -CT abd/pelvis reviewed.  - History of persistent lactic acidosis, likely secondary to liver cirrhosis and alcoholism, but with abdominal pain will obtain CTA. -Morphine 2 mg as needed -IV Protonix 40 daily -Currently on Ceftriaxone 2 g daily with flagyl ordered -Stool culture is neg -Stool for Cdiff remains pending -Pt is tolerating diet thus far   Hyponatremia  - At presentation  sodium of 129.  Likely from alcohol abuse. -Normalized with IVF hydration   Alcohol abuse -drinks hard liquor, about 5 shots daily.  Blood alcohol level today less than 10, but was 107 just 3 days ago.  She reports last alcoholic beverage was 4 days ago prior to arrival in the ED. - Continue CIWA as needed, thus far without evidence of withdrawals -Thiamine folate multivitamins   Hypokalemia  -Remains low, likely secondary to diarrhea/stool output -Will replace -Repeat bmet in AM  Hypomagnesemia -Likely related to increased stool output -Correct -would cont to follow and correct as needed   Seizure disorder- -reports no seizures in several years. -Resume phenobarbital, phenytoin, she reports she is on both and compliant -Stable thus far   Stroke with right-sided deficits-stable.   Liver cirrhosis-no stigmata of chronic liver disease.  Liver enzymes unremarkable. -Bili normalized  -Cont to follow   DVT prophylaxis: Lovenox subq Code Status: Full Family Communication: Pt in room, family not at bedside  Status is: Inpatient  Remains inpatient appropriate because:Inpatient level of care appropriate due to severity of illness  Dispo: The patient is from: Home              Anticipated d/c is to: Home              Patient currently is not medically stable to d/c.   Difficult to place patient No   Consultants:    Procedures:    Antimicrobials: Anti-infectives (From admission, onward)    Start     Dose/Rate Route Frequency Ordered Stop   05/29/21 1530  metroNIDAZOLE (FLAGYL)  IVPB 500 mg        500 mg 100 mL/hr over 60 Minutes Intravenous Every 8 hours 05/29/21 1430     05/29/21 0015  cefTRIAXone (ROCEPHIN) 2 g in sodium chloride 0.9 % 100 mL IVPB        2 g 200 mL/hr over 30 Minutes Intravenous Every 24 hours 05/28/21 2320     05/28/21 2330  cefTRIAXone (ROCEPHIN) 1 g in sodium chloride 0.9 % 100 mL IVPB  Status:  Discontinued        1 g 200 mL/hr over 30 Minutes  Intravenous Every 24 hours 05/28/21 2241 05/28/21 2320       Subjective: Eager to go home soon  Objective: Vitals:   06/01/21 0417 06/01/21 1424 06/01/21 2044 06/02/21 0433  BP: (!) 147/60 (!) 128/58 (!) 146/60 (!) 149/71  Pulse: 89 87 90 90  Resp: '16 18 18 18  '$ Temp: 98.1 F (36.7 C) 98.5 F (36.9 C) 97.9 F (36.6 C) 98.4 F (36.9 C)  TempSrc: Oral Oral  Oral  SpO2: 100% 100% 100% 100%  Weight:      Height:        Intake/Output Summary (Last 24 hours) at 06/02/2021 1350 Last data filed at 06/02/2021 1254 Gross per 24 hour  Intake 1676 ml  Output --  Net 1676 ml    Filed Weights   05/28/21 2208  Weight: 66.9 kg    Examination: General exam: Conversant, in no acute distress Respiratory system: normal chest rise, clear, no audible wheezing Cardiovascular system: regular rhythm, s1-s2 Gastrointestinal system: Nondistended, nontender, pos BS Central nervous system: No seizures, no tremors Extremities: No cyanosis, no joint deformities Skin: No rashes, no pallor Psychiatry: Affect normal // no auditory hallucinations   Data Reviewed: I have personally reviewed following labs and imaging studies  CBC: Recent Labs  Lab 05/28/21 1641 05/29/21 0601 05/30/21 0541 05/31/21 0542  WBC 10.2 8.7 6.8 7.2  NEUTROABS 9.1*  --   --   --   HGB 10.1* 8.6* 8.8* 10.0*  HCT 30.2* 26.4* 29.0* 31.8*  MCV 83.9 87.1 90.3 89.3  PLT 110* 91* 98* 109*    Basic Metabolic Panel: Recent Labs  Lab 05/28/21 1642 05/29/21 0601 05/30/21 0541 05/31/21 0542 06/01/21 0804 06/02/21 0410  NA  --  136 142 137 136 140  K  --  2.9* 3.8 3.2* 3.1* 2.5*  CL  --  109 114* 107 106 115*  CO2  --  19* 21* 21* 20* 19*  GLUCOSE  --  93 89 144* 110* 76  BUN  --  6* 7* 5* <5* <5*  CREATININE  --  0.70 0.61 0.64 0.49 0.39*  CALCIUM  --  7.9* 8.5* 8.2* 8.4* 6.9*  MG 1.4*  --  2.3  --  1.6* 1.3*  PHOS 3.0  --   --   --   --   --     GFR: Estimated Creatinine Clearance: 66.8 mL/min (A) (by C-G  formula based on SCr of 0.39 mg/dL (L)). Liver Function Tests: Recent Labs  Lab 05/28/21 1641 05/30/21 0541 05/31/21 0542 06/01/21 0804 06/02/21 0410  AST 40 35 34 27 24  ALT '28 22 20 18 16  '$ ALKPHOS 94 76 85 78 63  BILITOT 1.5* 0.7 0.9 0.7 0.4  PROT 7.5 6.4* 7.0 6.3* 5.0*  ALBUMIN 3.7 2.9* 3.2* 2.9* 2.3*    Recent Labs  Lab 05/28/21 1641  LIPASE 22    No results for input(s): AMMONIA in  the last 168 hours. Coagulation Profile: No results for input(s): INR, PROTIME in the last 168 hours.  Cardiac Enzymes: No results for input(s): CKTOTAL, CKMB, CKMBINDEX, TROPONINI in the last 168 hours. BNP (last 3 results) No results for input(s): PROBNP in the last 8760 hours. HbA1C: No results for input(s): HGBA1C in the last 72 hours. CBG: No results for input(s): GLUCAP in the last 168 hours. Lipid Profile: No results for input(s): CHOL, HDL, LDLCALC, TRIG, CHOLHDL, LDLDIRECT in the last 72 hours. Thyroid Function Tests: No results for input(s): TSH, T4TOTAL, FREET4, T3FREE, THYROIDAB in the last 72 hours. Anemia Panel: No results for input(s): VITAMINB12, FOLATE, FERRITIN, TIBC, IRON, RETICCTPCT in the last 72 hours. Sepsis Labs: Recent Labs  Lab 05/28/21 1641 05/28/21 1830  LATICACIDVEN 3.3* 2.1*     Recent Results (from the past 240 hour(s))  Culture, blood (routine x 2)     Status: Abnormal   Collection Time: 05/25/21  8:33 AM   Specimen: BLOOD LEFT FOREARM  Result Value Ref Range Status   Specimen Description   Final    BLOOD LEFT FOREARM Performed at [facility], [address], [place], [place] [address]    Special Requests   Final    BOTTLES DRAWN AEROBIC AND ANAEROBIC Blood Culture results may not be optimal due to an inadequate volume of blood received in culture bottles Performed at [facility], [address], [place], [place] [address]    Culture  Setup Time   Final    GRAM POSITIVE COCCI IN BOTH AEROBIC AND ANAEROBIC BOTTLES Gram Stain Report Called  to,Read Back By and Verified With: DAVIS,M (MHP) AT [id number] ON 8.9.22 BY RUCINSKI,B Performed at [facility], READ BACK BY AND VERIFIED WITH: Casandra Doffing RN 2014 05/26/21 A BROWNING    Culture (A)  Final    MICROCOCCUS LUTEUS/LYLAE THE SIGNIFICANCE OF ISOLATING THIS ORGANISM FROM A SINGLE SET OF BLOOD CULTURES WHEN MULTIPLE SETS ARE DRAWN IS UNCERTAIN. PLEASE NOTIFY THE MICROBIOLOGY DEPARTMENT WITHIN ONE WEEK IF SPECIATION AND SENSITIVITIES ARE REQUIRED. Performed at [facility], [place] [address], [place], [place] [address]    Report Status 06/01/2021 FINAL  Final  Blood Culture ID Panel (Reflexed)     Status: None   Collection Time: 05/25/21  8:33 AM  Result Value Ref Range Status   Enterococcus faecalis NOT DETECTED NOT DETECTED Final   Enterococcus Faecium NOT DETECTED NOT DETECTED Final   Listeria monocytogenes NOT DETECTED NOT DETECTED Final   Staphylococcus species NOT DETECTED NOT DETECTED Final   Staphylococcus aureus (BCID) NOT DETECTED NOT DETECTED Final   Staphylococcus epidermidis NOT DETECTED NOT DETECTED Final   Staphylococcus lugdunensis NOT DETECTED NOT DETECTED Final   Streptococcus species NOT DETECTED NOT DETECTED Final   Streptococcus agalactiae NOT DETECTED NOT DETECTED Final   Streptococcus pneumoniae NOT DETECTED NOT DETECTED Final   Streptococcus pyogenes NOT DETECTED NOT DETECTED Final   A.calcoaceticus-baumannii NOT DETECTED NOT DETECTED Final   Bacteroides fragilis NOT DETECTED NOT DETECTED Final   Enterobacterales NOT DETECTED NOT DETECTED Final   Enterobacter cloacae complex NOT DETECTED NOT DETECTED Final   Escherichia coli NOT DETECTED NOT DETECTED Final   Klebsiella aerogenes NOT DETECTED NOT DETECTED Final   Klebsiella oxytoca NOT DETECTED NOT DETECTED Final   Klebsiella pneumoniae NOT DETECTED NOT DETECTED Final   Proteus species NOT DETECTED NOT DETECTED Final   Salmonella species NOT DETECTED NOT DETECTED Final    Serratia marcescens NOT DETECTED NOT  DETECTED Final   Haemophilus influenzae NOT DETECTED NOT DETECTED Final   Neisseria meningitidis NOT DETECTED NOT DETECTED Final   Pseudomonas aeruginosa NOT DETECTED NOT DETECTED Final   Stenotrophomonas maltophilia NOT DETECTED NOT DETECTED Final   Candida albicans NOT DETECTED NOT DETECTED Final   Candida auris NOT DETECTED NOT DETECTED Final   Candida glabrata NOT DETECTED NOT DETECTED Final   Candida krusei NOT DETECTED NOT DETECTED Final   Candida parapsilosis NOT DETECTED NOT DETECTED Final   Candida tropicalis NOT DETECTED NOT DETECTED Final   Cryptococcus neoformans/gattii NOT DETECTED NOT DETECTED Final    Comment: Performed at [facility], [place] [address] [place], [place] [address]  Culture, blood (routine x 2)     Status: None   Collection Time: 05/25/21  8:38 AM   Specimen: BLOOD LEFT HAND  Result Value Ref Range Status   Specimen Description BLOOD LEFT HAND  Final   Special Requests   Final    BOTTLES DRAWN AEROBIC AND ANAEROBIC Blood Culture adequate volume   Culture   Final    NO GROWTH 5 DAYS Performed at [facility], [address], [place], [place] [address]    Report Status 05/30/2021 FINAL  Final  Urine Culture     Status: Abnormal   Collection Time: 05/25/21  9:39 AM   Specimen: Urine, Clean Catch  Result Value Ref Range Status   Specimen Description   Final    URINE, CLEAN CATCH Performed at [facility], [address] [place], [place] [address]    Special Requests   Final    NONE Performed at [facility], [address] [place], [place] [address]    Culture MULTIPLE SPECIES PRESENT, SUGGEST RECOLLECTION (A)  Final   Report Status 05/27/2021 FINAL  Final  Resp Panel by RT-PCR (Flu A&B, Covid) Nasopharyngeal Swab     Status: None   Collection Time: 05/28/21  4:12 PM   Specimen: Nasopharyngeal Swab; Nasopharyngeal(NP) swabs in vial transport medium  Result Value Ref Range Status   SARS Coronavirus 2 by RT  PCR NEGATIVE NEGATIVE Final    Comment: (NOTE) SARS-CoV-2 target nucleic acids are NOT DETECTED.  The SARS-CoV-2 RNA is generally detectable in upper respiratory specimens during the acute phase of infection. The lowest concentration of SARS-CoV-2 viral copies this assay can detect is 138 copies/mL. A negative result does not preclude SARS-Cov-2 infection and should not be used as the sole basis for treatment or other patient management decisions. A negative result may occur with  improper specimen collection/handling, submission of specimen other than nasopharyngeal swab, presence of viral mutation(s) within the areas targeted by this assay, and inadequate number of viral copies(<138 copies/mL). A negative result must be combined with clinical observations, patient history, and epidemiological information. The expected result is Negative.  Fact Sheet for Patients:  EntrepreneurPulse.com.au  Fact Sheet for Healthcare Providers:  IncredibleEmployment.be  This test is no t yet approved or cleared by the [place] FDA and  has been authorized for detection and/or diagnosis of SARS-CoV-2 by FDA under an Emergency Use Authorization (EUA). This EUA will remain  in effect (meaning this test can be used) for the duration of the COVID-19 declaration under Section 564(b)(1) of the Act, 21 U.S.C.section 360bbb-3(b)(1), unless the authorization is terminated  or revoked sooner.       Influenza A by PCR NEGATIVE NEGATIVE Final   Influenza B by PCR NEGATIVE NEGATIVE Final    Comment: (NOTE) The Xpert Xpress SARS-CoV-2/FLU/RSV plus  assay is intended as an aid in the diagnosis of influenza from Nasopharyngeal swab specimens and should not be used as a sole basis for treatment. Nasal washings and aspirates are unacceptable for Xpert Xpress SARS-CoV-2/FLU/RSV testing.  Fact Sheet for Patients: EntrepreneurPulse.com.au  Fact Sheet for  Healthcare Providers: IncredibleEmployment.be  This test is not yet approved or cleared by the [place] FDA and has been authorized for detection and/or diagnosis of SARS-CoV-2 by FDA under an Emergency Use Authorization (EUA). This EUA will remain in effect (meaning this test can be used) for the duration of the COVID-19 declaration under Section 564(b)(1) of the Act, 21 U.S.C. section 360bbb-3(b)(1), unless the authorization is terminated or revoked.  Performed at [facility], [address], [place], [place] [address]   Gastrointestinal Panel by PCR , Stool     Status: None   Collection Time: 05/31/21 10:16 AM   Specimen: Stool  Result Value Ref Range Status   Campylobacter species NOT DETECTED NOT DETECTED Final   Plesimonas shigelloides NOT DETECTED NOT DETECTED Final   Salmonella species NOT DETECTED NOT DETECTED Final   Yersinia enterocolitica NOT DETECTED NOT DETECTED Final   Vibrio species NOT DETECTED NOT DETECTED Final   Vibrio cholerae NOT DETECTED NOT DETECTED Final   Enteroaggregative E coli (EAEC) NOT DETECTED NOT DETECTED Final   Enteropathogenic E coli (EPEC) NOT DETECTED NOT DETECTED Final   Enterotoxigenic E coli (ETEC) NOT DETECTED NOT DETECTED Final   Shiga like toxin producing E coli (STEC) NOT DETECTED NOT DETECTED Final   Shigella/Enteroinvasive E coli (EIEC) NOT DETECTED NOT DETECTED Final   Cryptosporidium NOT DETECTED NOT DETECTED Final   Cyclospora cayetanensis NOT DETECTED NOT DETECTED Final   Entamoeba histolytica NOT DETECTED NOT DETECTED Final   Giardia lamblia NOT DETECTED NOT DETECTED Final   Adenovirus F40/41 NOT DETECTED NOT DETECTED Final   Astrovirus NOT DETECTED NOT DETECTED Final   Norovirus GI/GII NOT DETECTED NOT DETECTED Final   Rotavirus A NOT DETECTED NOT DETECTED Final   Sapovirus (I, II, IV, and V) NOT DETECTED NOT DETECTED Final    Comment: Performed at [facility], [address] [place],  [place] [address]      Radiology Studies: No results found.  Scheduled Meds:  enoxaparin (LOVENOX) injection  40 mg Subcutaneous [id number]   folic acid  1 mg Oral Daily   multivitamin with minerals  1 tablet Oral Daily   pantoprazole (PROTONIX) IV  40 mg Intravenous Q24H   PHENobarbital  32.4 mg Oral BID   phenytoin  100 mg Oral TID   potassium chloride  20 mEq Oral TID   thiamine  100 mg Oral Daily   Or   thiamine  100 mg Intravenous Daily   Continuous Infusions:  sodium chloride 75 mL/hr at 06/01/21 2137   cefTRIAXone (ROCEPHIN)  IV 2 g (06/02/21 0025)   metronidazole 500 mg (06/02/21 [id number])     LOS: 5 days   Marylu Lund, MD [facility] Pager On Amion  If 7PM-7AM, please contact night-coverage 06/02/2021, 1:50 PM

## 2021-06-03 DIAGNOSIS — G40909 Epilepsy, unspecified, not intractable, without status epilepticus: Secondary | ICD-10-CM

## 2021-06-03 LAB — BASIC METABOLIC PANEL
Anion gap: 6 (ref 5–15)
BUN: <5 mg/dL — ABNORMAL LOW (ref 8–23)
CO2: 19 mmol/L — ABNORMAL LOW (ref 22–32)
Calcium: 7.6 mg/dL — ABNORMAL LOW (ref 8.9–10.3)
Chloride: 113 mmol/L — ABNORMAL HIGH (ref 98–111)
Creatinine, Ser: 0.47 mg/dL (ref 0.44–1.00)
GFR, Estimated: >60 mL/min (ref >60)
Glucose, Bld: 92 mg/dL (ref 70–99)
Potassium: 3.5 mmol/L (ref 3.5–5.1)
Sodium: 138 mmol/L (ref 135–145)

## 2021-06-03 LAB — C DIFFICILE QUICK SCREEN W PCR REFLEX
C Diff antigen: NEGATIVE
C Diff interpretation: NOT DETECTED
C Diff toxin: NEGATIVE

## 2021-06-03 LAB — MAGNESIUM: Magnesium: 1.8 mg/dL (ref 1.7–2.4)

## 2021-06-03 MED ORDER — AMOXICILLIN-POT CLAVULANATE 875-125 MG PO TABS: 1.0000 | ORAL_TABLET | Freq: Two times a day (BID) | ORAL | 0 refills | Status: AC

## 2021-06-03 MED ORDER — LOPERAMIDE HCL 2 MG PO CAPS: 2.0000 mg | ORAL_CAPSULE | Freq: Three times a day (TID) | ORAL | 0 refills | Status: DC | PRN

## 2021-06-03 NOTE — Discharge Summary (Signed)
Physician Discharge Summary  [name] [id number] DOB: 04-Dec-1955 DOA: 05/28/2021  PCP: [name], MD  Admit date: 05/28/2021 Discharge date: 06/03/2021  Admitted From: Home Disposition: Home  Recommendations for Outpatient Follow-up:  Follow up with PCP in 1-2 weeks Please obtain BMP/CBC in one week  Home Health: Equipment/Devices:  Discharge Condition: Stable CODE STATUS: Full code Diet recommendation: Heart healthy  Brief/Interim Summary: 65 year old female with a history of alcohol use, liver cirrhosis, prior stroke with right-sided deficits, seizure disorder, presents to the emergency room with complaints of abdominal pain.  She was also having multiple stools.  She reported dysuria and change in color of urine.  She was felt to have sepsis secondary to possible urinary tract infection and was admitted for further treatments.  Discharge Diagnoses:  Principal Problem:   Severe sepsis ([place]) Active Problems:   Alcohol abuse   Seizure disorder (HCC)   Cirrhosis of liver without ascites (HCC)   Stroke (HCC)   Diarrhea   Lactic acidosis   Acute lower UTI  Severe Sepsis secondary to urinary tract infection/diverticulitisPresented tachycardic to 126, febrile to 102.9, tachypneic to 29-meeting severe sepsis criteria with lactic acidosis of 3.3 > 2.1, after fluid boluses.  Stable blood pressure systolic [id number].  Urine was purulent appearing, and suggestive of UTI with large leukocytes, many bacteria.  -Blood cultures with 1/2 Micrococcus luteus, suspect contaminant -She was treated with IV ceftriaxone 2 g daily and Flagyl -Blood and urine cultures are unremarkable -Overall, she defervesced and sepsis physiology resolved -Stool studies found to be negative -She was adequately treated for UTI with ceftriaxone -She was transitioned to Augmentin to cover diverticulitis   Abdominal pain, persistent lactic acidosis secondary to enteritis -CT abd/pelvis reviewed.  No  indication of mesenteric ischemia.  Did comment on possible enteritis versus diverticulitis -Stool for C. difficile and GI pathogen panel found to be negative -Treated with ceftriaxone and Flagyl -She has been transitioned to Augmentin and overall diarrhea has improved.   Hyponatremia  - At presentation sodium of 129.  Likely from alcohol abuse. -Normalized with IVF hydration   Alcohol abuse -drinks hard liquor, about 5 shots daily.  Blood alcohol level today less than 10, but was 107 just 3 days ago.  She reports last alcoholic beverage was 4 days ago prior to arrival in the ED. -Monitor on CIWA protocol and did not have any evidence of withdrawal -Thiamine folate multivitamins   Hypokalemia  -Replaced   Hypomagnesemia -Likely related to increased stool output -Replaced   Seizure disorder- -reports no seizures in several years. -Resume phenobarbital, phenytoin, she reports she is on both and compliant -Stable thus far   Stroke with right-sided deficits-stable.   Liver cirrhosis-no stigmata of chronic liver disease.  Liver enzymes unremarkable. -Bili normalized  -Cont to follow  Discharge Instructions  Discharge Instructions     Diet - low sodium heart healthy   Complete by: As directed    Increase activity slowly   Complete by: As directed       Allergies as of 06/03/2021   No Active Allergies      Medication List     STOP taking these medications    linaclotide 290 MCG Caps capsule Commonly known as: Linzess       TAKE these medications    albuterol 108 (90 Base) MCG/ACT inhaler Commonly known as: VENTOLIN HFA Inhale 2 puffs into the lungs every 6 (six) hours as needed for wheezing or shortness of breath.   amoxicillin-clavulanate 875-125 MG  tablet Commonly known as: Augmentin Take 1 tablet by mouth 2 (two) times daily for 5 days.   Ensure Plus Liqd Take 237 mLs by mouth 3 (three) times daily between meals.   folic acid [id number] MCG tablet Commonly  known as: FOLVITE Take 400 mcg by mouth daily.   HYDROcodone-acetaminophen 5-325 MG tablet Commonly known as: NORCO/VICODIN One tablet every six hours for pain.  Limit 7 days.   lisinopril 40 MG tablet Commonly known as: ZESTRIL Take 1 tablet (40 mg total) by mouth daily.   loperamide 2 MG capsule Commonly known as: IMODIUM Take 1 capsule (2 mg total) by mouth 3 (three) times daily as needed for diarrhea or loose stools.   pantoprazole 40 MG tablet Commonly known as: Protonix Take 1 tablet (40 mg total) by mouth 2 (two) times daily before a meal.   PHENobarbital 32.4 MG tablet Commonly known as: LUMINAL Take 32.4 mg by mouth 2 (two) times daily.   phenytoin 100 MG ER capsule Commonly known as: DILANTIN Take 100 mg by mouth 3 (three) times daily.   potassium chloride SA 20 MEQ tablet Commonly known as: KLOR-CON Take 20 mEq by mouth 3 (three) times daily.   solifenacin 5 MG tablet Commonly known as: VESICARE Take 5 mg by mouth daily.        Follow-up Information     [name], MD. Schedule an appointment as soon as possible for a visit in 2 week(s).   Specialty: Internal Medicine Contact information: [place] [address] [phone]                No Active Allergies  Consultations:    Procedures/Studies: CT Abdomen Pelvis W Contrast  Result Date: 05/25/2021 CLINICAL DATA:  Abdominal pain EXAM: CT ABDOMEN AND PELVIS WITH CONTRAST TECHNIQUE: Multidetector CT imaging of the abdomen and pelvis was performed using the standard protocol following bolus administration of intravenous contrast. CONTRAST:  59m OMNIPAQUE IOHEXOL 350 MG/ML SOLN COMPARISON:  CT abdomen and pelvis dated Feb 22, 2021 FINDINGS: Lower chest: Nodular opacity of the posterior right lower lobe located adjacent to the spine, unchanged compared to prior CT dating back to April 10, 2019. Additional linear focal opacities are seen which are likely due to scarring or  atelectasis. Normal heart size with no pericardial effusion. Hepatobiliary: Cirrhotic liver morphology. No suspicious focal liver lesion. Gallbladder is unremarkable. No biliary ductal dilation. Pancreas: Unremarkable. No pancreatic ductal dilatation or surrounding inflammatory changes. Spleen: Normal in size without focal abnormality. Adrenals/Urinary Tract: Adrenal glands are unremarkable. Kidneys are normal, without renal calculi, focal lesion, or hydronephrosis. Mild bladder wall thickening. Stomach/Bowel: Small hiatal hernia. Stomach otherwise unremarkable. Normal appendix. Diverticula of the descending colon. No inflammatory change or evidence of wall thickening. Vascular/Lymphatic: Aortic atherosclerosis. No enlarged abdominal or pelvic lymph nodes. Reproductive: Status post hysterectomy. No adnexal masses. Other: No abdominal wall hernia or abnormality. No abdominopelvic ascites. Musculoskeletal: No acute or significant osseous findings. IMPRESSION: No CT findings to explain abdominal pain. Mild bladder wall thickening, findings can be seen in the setting of cystitis. Correlate with urinalysis. Electronically Signed   By: LYetta GlassmanMD   On: 05/25/2021 11:07   DG CHEST PORT 1 VIEW  Result Date: 05/28/2021 CLINICAL DATA:  Fever with chest pain EXAM: PORTABLE CHEST 1 VIEW COMPARISON:  05/25/2021 FINDINGS: The heart size and mediastinal contours are within normal limits. Both lungs are clear. Aortic atherosclerosis. The visualized skeletal structures are unremarkable. IMPRESSION: No active disease. Electronically Signed  By: Donavan Foil M.D.   On: 05/28/2021 23:52   DG Chest Port 1 View  Result Date: 05/25/2021 CLINICAL DATA:  Abdominal pain for 2 weeks, possible bright red blood in stool, sepsis, daily ethanol intake EXAM: PORTABLE CHEST 1 VIEW COMPARISON:  Portable exam 0822 hours compared to 12/17/2020 FINDINGS: Normal heart size, mediastinal contours, and pulmonary vascularity.  Atherosclerotic calcification aorta. Lungs clear. No pulmonary infiltrate, pleural effusion, or pneumothorax. Osseous demineralization. Question chronic LEFT rotator cuff tear. IMPRESSION: No acute abnormalities. Aortic Atherosclerosis (ICD10-I70.0). Electronically Signed   By: Lavonia Dana M.D.   On: 05/25/2021 08:29   CT Angio Abd/Pel w/ and/or w/o  Result Date: 05/29/2021 CLINICAL DATA:  Persistent generalized abdominal pain, diffuse abdominal tenderness, lactic acidosis, fever, diarrhea and vomiting. EXAM: CT ANGIOGRAPHY ABDOMEN AND PELVIS WITH CONTRAST TECHNIQUE: Multidetector CT imaging of the abdomen and pelvis was performed using the standard protocol during bolus administration of intravenous contrast. Multiplanar reconstructed images and MIPs were obtained and reviewed to evaluate the vascular anatomy. CONTRAST:  154m OMNIPAQUE IOHEXOL 350 MG/ML SOLN COMPARISON:  CT of the abdomen and pelvis on 05/25/2021 FINDINGS: VASCULAR Aorta: Normally patent without significant atherosclerosis. No evidence of aneurysm, dissection or stenosis. Celiac: Mild calcified plaque at origin without significant stenosis. Distal branch vessels are normally patent and demonstrate normal branching anatomy. SMA: Mild calcified plaque at origin without significant stenosis. Distal branch vessels are normally patent. Renals: Single right renal artery and 2 separate left renal arteries demonstrate normal patency. IMA: Normally patent. Inflow: Normally patent bilateral iliac arteries without evidence of stenosis or aneurysm. Proximal Outflow: Normally patent bilateral common femoral arteries and femoral bifurcations. Veins: Venous phase imaging demonstrates normal patency of venous structures including the IVC, bilateral renal veins, iliac veins and common femoral veins. Visualized mesenteric veins, splenic vein and portal vein are normally patent. Review of the MIP images confirms the above findings. NON-VASCULAR Lower chest:  Stable small hiatal hernia. Hepatobiliary: Stable appearance of cirrhosis of the liver without visible mass lesion. The gallbladder and bile ducts are unremarkable. Pancreas: Unremarkable. No pancreatic ductal dilatation or surrounding inflammatory changes. Spleen: Normal in size without focal abnormality. Adrenals/Urinary Tract: Adrenal glands are unremarkable. Kidneys are normal, without renal calculi, focal lesion, or hydronephrosis. Bladder is unremarkable. Stomach/Bowel: Diffuse fluid present throughout multiple nondilated small bowel loops as well as the colon. Findings are suggestive of enteritis. At the level of multiple descending colonic and sigmoid diverticula, there are some new mild surrounding inflammatory changes in the pericolonic fat especially at the level of the distal descending colon and proximal sigmoid colon. Component of mild diverticulitis is suspected. There is no evidence of focal abscess or extraluminal air. No evidence of bowel obstruction, focal mass or free intraperitoneal air. No varices identified. Lymphatic: No enlarged abdominal or pelvic lymph nodes. Reproductive: Status post hysterectomy. No adnexal masses. Other: No abdominal wall hernia or abnormality. No abdominopelvic ascites. Musculoskeletal: No acute or significant osseous findings. IMPRESSION: VASCULAR No evidence of mesenteric arterial occlusive disease. NON-VASCULAR 1. Fluid in multiple nondilated small bowel loops and the colon is suggestive of enteritis. 2. Possible additional component of diverticulitis at the level of the distal descending colon and proximal sigmoid colon due to some new surrounding inflammatory changes in the pericolonic fat. No evidence of focal abscess or extraluminal air. 3. Stable evidence of cirrhosis of the liver. No visible associated ascites or varices. 4. Stable small hiatal hernia. Electronically Signed   By: GAletta EdouardM.D.   On: 05/29/2021 12:10  Subjective: Reports that  loose stools have improved, stool this morning was more formed.  No vomiting.  Tolerating diet.  Discharge Exam: Vitals:   06/02/21 0433 06/02/21 1800 06/03/21 0543 06/03/21 1147  BP: (!) 149/71 135/65 (!) 153/82 (!) 154/66  Pulse: 90 89 93 91  Resp: '18 18 19 18  '$ Temp: 98.4 F (36.9 C) 98.3 F (36.8 C) 98.3 F (36.8 C) 98.2 F (36.8 C)  TempSrc: Oral Oral Oral Oral  SpO2: 100% 100% 100% 100%  Weight:      Height:        General: Pt is alert, awake, not in acute distress Cardiovascular: RRR, S1/S2 +, no rubs, no gallops Respiratory: CTA bilaterally, no wheezing, no rhonchi Abdominal: Soft, NT, ND, bowel sounds + Extremities: no edema, no cyanosis    The results of significant diagnostics from this hospitalization (including imaging, microbiology, ancillary and laboratory) are listed below for reference.     Microbiology: Recent Results (from the past 240 hour(s))  Culture, blood (routine x 2)     Status: Abnormal   Collection Time: 05/25/21  8:33 AM   Specimen: BLOOD LEFT FOREARM  Result Value Ref Range Status   Specimen Description   Final    BLOOD LEFT FOREARM Performed at [facility], [address], [place], [place] [address]    Special Requests   Final    BOTTLES DRAWN AEROBIC AND ANAEROBIC Blood Culture results may not be optimal due to an inadequate volume of blood received in culture bottles Performed at [facility], [address] [place], [place] [address]    Culture  Setup Time   Final    GRAM POSITIVE COCCI IN BOTH AEROBIC AND ANAEROBIC BOTTLES Gram Stain Report Called to,Read Back By and Verified With: DAVIS,M (MHP) AT [id number] ON 8.9.22 BY RUCINSKI,B Performed at [facility], READ BACK BY AND VERIFIED WITH: Casandra Doffing RN 2014 05/26/21 A BROWNING    Culture (A)  Final    MICROCOCCUS LUTEUS/LYLAE THE SIGNIFICANCE OF ISOLATING THIS ORGANISM FROM A SINGLE SET OF BLOOD CULTURES WHEN MULTIPLE SETS ARE DRAWN IS UNCERTAIN. PLEASE  NOTIFY THE MICROBIOLOGY DEPARTMENT WITHIN ONE WEEK IF SPECIATION AND SENSITIVITIES ARE REQUIRED. Performed at [facility], [place] [address], [place], [place] [address]    Report Status 06/01/2021 FINAL  Final  Blood Culture ID Panel (Reflexed)     Status: None   Collection Time: 05/25/21  8:33 AM  Result Value Ref Range Status   Enterococcus faecalis NOT DETECTED NOT DETECTED Final   Enterococcus Faecium NOT DETECTED NOT DETECTED Final   Listeria monocytogenes NOT DETECTED NOT DETECTED Final   Staphylococcus species NOT DETECTED NOT DETECTED Final   Staphylococcus aureus (BCID) NOT DETECTED NOT DETECTED Final   Staphylococcus epidermidis NOT DETECTED NOT DETECTED Final   Staphylococcus lugdunensis NOT DETECTED NOT DETECTED Final   Streptococcus species NOT DETECTED NOT DETECTED Final   Streptococcus agalactiae NOT DETECTED NOT DETECTED Final   Streptococcus pneumoniae NOT DETECTED NOT DETECTED Final   Streptococcus pyogenes NOT DETECTED NOT DETECTED Final   A.calcoaceticus-baumannii NOT DETECTED NOT DETECTED Final   Bacteroides fragilis NOT DETECTED NOT DETECTED Final   Enterobacterales NOT DETECTED NOT DETECTED Final   Enterobacter cloacae complex NOT DETECTED NOT DETECTED Final   Escherichia coli NOT DETECTED NOT DETECTED Final   Klebsiella aerogenes NOT DETECTED NOT DETECTED Final   Klebsiella oxytoca NOT DETECTED NOT DETECTED Final   Klebsiella pneumoniae NOT DETECTED NOT DETECTED Final  Proteus species NOT DETECTED NOT DETECTED Final   Salmonella species NOT DETECTED NOT DETECTED Final   Serratia marcescens NOT DETECTED NOT DETECTED Final   Haemophilus influenzae NOT DETECTED NOT DETECTED Final   Neisseria meningitidis NOT DETECTED NOT DETECTED Final   Pseudomonas aeruginosa NOT DETECTED NOT DETECTED Final   Stenotrophomonas maltophilia NOT DETECTED NOT DETECTED Final   Candida albicans NOT DETECTED NOT DETECTED Final   Candida auris NOT DETECTED NOT DETECTED Final    Candida glabrata NOT DETECTED NOT DETECTED Final   Candida krusei NOT DETECTED NOT DETECTED Final   Candida parapsilosis NOT DETECTED NOT DETECTED Final   Candida tropicalis NOT DETECTED NOT DETECTED Final   Cryptococcus neoformans/gattii NOT DETECTED NOT DETECTED Final    Comment: Performed at [facility], [address], [place], [place] [address]  Culture, blood (routine x 2)     Status: None   Collection Time: 05/25/21  8:38 AM   Specimen: BLOOD LEFT HAND  Result Value Ref Range Status   Specimen Description BLOOD LEFT HAND  Final   Special Requests   Final    BOTTLES DRAWN AEROBIC AND ANAEROBIC Blood Culture adequate volume   Culture   Final    NO GROWTH 5 DAYS Performed at [facility], [address] [place], [place] [address]    Report Status 05/30/2021 FINAL  Final  Urine Culture     Status: Abnormal   Collection Time: 05/25/21  9:39 AM   Specimen: Urine, Clean Catch  Result Value Ref Range Status   Specimen Description   Final    URINE, CLEAN CATCH Performed at [facility], [address], [place], [place] [address]    Special Requests   Final    NONE Performed at [facility], [address], [place], [place] [address]    Culture MULTIPLE SPECIES PRESENT, SUGGEST RECOLLECTION (A)  Final   Report Status 05/27/2021 FINAL  Final  Resp Panel by RT-PCR (Flu A&B, Covid) Nasopharyngeal Swab     Status: None   Collection Time: 05/28/21  4:12 PM   Specimen: Nasopharyngeal Swab; Nasopharyngeal(NP) swabs in vial transport medium  Result Value Ref Range Status   SARS Coronavirus 2 by RT PCR NEGATIVE NEGATIVE Final    Comment: (NOTE) SARS-CoV-2 target nucleic acids are NOT DETECTED.  The SARS-CoV-2 RNA is generally detectable in upper respiratory specimens during the acute phase of infection. The lowest concentration of SARS-CoV-2 viral copies this assay can detect is 138 copies/mL. A negative result does not preclude SARS-Cov-2 infection and should not be used as the  sole basis for treatment or other patient management decisions. A negative result may occur with  improper specimen collection/handling, submission of specimen other than nasopharyngeal swab, presence of viral mutation(s) within the areas targeted by this assay, and inadequate number of viral copies(<138 copies/mL). A negative result must be combined with clinical observations, patient history, and epidemiological information. The expected result is Negative.  Fact Sheet for Patients:  EntrepreneurPulse.com.au  Fact Sheet for Healthcare Providers:  IncredibleEmployment.be  This test is no t yet approved or cleared by the [place] FDA and  has been authorized for detection and/or diagnosis of SARS-CoV-2 by FDA under an Emergency Use Authorization (EUA). This EUA will remain  in effect (meaning this test can be used) for the duration of the COVID-19 declaration under Section 564(b)(1) of the Act, 21 U.S.C.section 360bbb-3(b)(1), unless the authorization is terminated  or revoked sooner.       Influenza A  by PCR NEGATIVE NEGATIVE Final   Influenza B by PCR NEGATIVE NEGATIVE Final    Comment: (NOTE) The Xpert Xpress SARS-CoV-2/FLU/RSV plus assay is intended as an aid in the diagnosis of influenza from Nasopharyngeal swab specimens and should not be used as a sole basis for treatment. Nasal washings and aspirates are unacceptable for Xpert Xpress SARS-CoV-2/FLU/RSV testing.  Fact Sheet for Patients: EntrepreneurPulse.com.au  Fact Sheet for Healthcare Providers: IncredibleEmployment.be  This test is not yet approved or cleared by the [place] FDA and has been authorized for detection and/or diagnosis of SARS-CoV-2 by FDA under an Emergency Use Authorization (EUA). This EUA will remain in effect (meaning this test can be used) for the duration of the COVID-19 declaration under Section 564(b)(1) of the  Act, 21 U.S.C. section 360bbb-3(b)(1), unless the authorization is terminated or revoked.  Performed at [facility], [address] [place], [place] [address]   Gastrointestinal Panel by PCR , Stool     Status: None   Collection Time: 05/31/21 10:16 AM   Specimen: Stool  Result Value Ref Range Status   Campylobacter species NOT DETECTED NOT DETECTED Final   Plesimonas shigelloides NOT DETECTED NOT DETECTED Final   Salmonella species NOT DETECTED NOT DETECTED Final   Yersinia enterocolitica NOT DETECTED NOT DETECTED Final   Vibrio species NOT DETECTED NOT DETECTED Final   Vibrio cholerae NOT DETECTED NOT DETECTED Final   Enteroaggregative E coli (EAEC) NOT DETECTED NOT DETECTED Final   Enteropathogenic E coli (EPEC) NOT DETECTED NOT DETECTED Final   Enterotoxigenic E coli (ETEC) NOT DETECTED NOT DETECTED Final   Shiga like toxin producing E coli (STEC) NOT DETECTED NOT DETECTED Final   Shigella/Enteroinvasive E coli (EIEC) NOT DETECTED NOT DETECTED Final   Cryptosporidium NOT DETECTED NOT DETECTED Final   Cyclospora cayetanensis NOT DETECTED NOT DETECTED Final   Entamoeba histolytica NOT DETECTED NOT DETECTED Final   Giardia lamblia NOT DETECTED NOT DETECTED Final   Adenovirus F40/41 NOT DETECTED NOT DETECTED Final   Astrovirus NOT DETECTED NOT DETECTED Final   Norovirus GI/GII NOT DETECTED NOT DETECTED Final   Rotavirus A NOT DETECTED NOT DETECTED Final   Sapovirus (I, II, IV, and V) NOT DETECTED NOT DETECTED Final    Comment: Performed at [facility], [address] [place], [place] [address]  C Difficile Quick Screen w PCR reflex     Status: None   Collection Time: 05/31/21 12:30 PM   Specimen: STOOL  Result Value Ref Range Status   C Diff antigen NEGATIVE NEGATIVE Final   C Diff toxin NEGATIVE NEGATIVE Final   C Diff interpretation No C. difficile detected.  Final    Comment: Performed at [facility], [address], [place], [place] [address]     Labs: BNP  (last 3 results) No results for input(s): BNP in the last 8760 hours. Basic Metabolic Panel: Recent Labs  Lab 05/28/21 1642 05/29/21 0601 05/30/21 0541 05/31/21 0542 06/01/21 0804 06/02/21 0410 06/03/21 0351  NA  --    < > 142 137 136 140 138  K  --    < > 3.8 3.2* 3.1* 2.5* 3.5  CL  --    < > 114* 107 106 115* 113*  CO2  --    < > 21* 21* 20* 19* 19*  GLUCOSE  --    < > 89 144* 110* 76 92  BUN  --    < > 7* 5* <5* <5* <5*  CREATININE  --    < >  0.61 0.64 0.49 0.39* 0.47  CALCIUM  --    < > 8.5* 8.2* 8.4* 6.9* 7.6*  MG 1.4*  --  2.3  --  1.6* 1.3* 1.8  PHOS 3.0  --   --   --   --   --   --    < > = values in this interval not displayed.   Liver Function Tests: Recent Labs  Lab 05/28/21 1641 05/30/21 0541 05/31/21 0542 06/01/21 0804 06/02/21 0410  AST 40 35 34 27 24  ALT '28 22 20 18 16  '$ ALKPHOS 94 76 85 78 63  BILITOT 1.5* 0.7 0.9 0.7 0.4  PROT 7.5 6.4* 7.0 6.3* 5.0*  ALBUMIN 3.7 2.9* 3.2* 2.9* 2.3*   Recent Labs  Lab 05/28/21 1641  LIPASE 22   No results for input(s): AMMONIA in the last 168 hours. CBC: Recent Labs  Lab 05/28/21 1641 05/29/21 0601 05/30/21 0541 05/31/21 0542  WBC 10.2 8.7 6.8 7.2  NEUTROABS 9.1*  --   --   --   HGB 10.1* 8.6* 8.8* 10.0*  HCT 30.2* 26.4* 29.0* 31.8*  MCV 83.9 87.1 90.3 89.3  PLT 110* 91* 98* 109*   Cardiac Enzymes: No results for input(s): CKTOTAL, CKMB, CKMBINDEX, TROPONINI in the last 168 hours. BNP: Invalid input(s): POCBNP CBG: No results for input(s): GLUCAP in the last 168 hours. D-Dimer No results for input(s): DDIMER in the last 72 hours. Hgb A1c No results for input(s): HGBA1C in the last 72 hours. Lipid Profile No results for input(s): CHOL, HDL, LDLCALC, TRIG, CHOLHDL, LDLDIRECT in the last 72 hours. Thyroid function studies No results for input(s): TSH, T4TOTAL, T3FREE, THYROIDAB in the last 72 hours.  Invalid input(s): FREET3 Anemia work up No results for input(s): VITAMINB12, FOLATE, FERRITIN,  TIBC, IRON, RETICCTPCT in the last 72 hours. Urinalysis    Component Value Date/Time   COLORURINE YELLOW 05/28/2021 2025   APPEARANCEUR CLOUDY (A) 05/28/2021 2025   LABSPEC 1.008 05/28/2021 2025   PHURINE 8.0 05/28/2021 2025   GLUCOSEU NEGATIVE 05/28/2021 2025   HGBUR LARGE (A) 05/28/2021 2025   BILIRUBINUR SMALL (A) 05/28/2021 2025   KETONESUR NEGATIVE 05/28/2021 2025   PROTEINUR 30 (A) 05/28/2021 2025   UROBILINOGEN 0.2 12/26/2014 1927   NITRITE NEGATIVE 05/28/2021 2025   LEUKOCYTESUR LARGE (A) 05/28/2021 2025   Sepsis Labs Invalid input(s): PROCALCITONIN,  WBC,  LACTICIDVEN Microbiology Recent Results (from the past 240 hour(s))  Culture, blood (routine x 2)     Status: Abnormal   Collection Time: 05/25/21  8:33 AM   Specimen: BLOOD LEFT FOREARM  Result Value Ref Range Status   Specimen Description   Final    BLOOD LEFT FOREARM Performed at [facility], [address], [place], [place] [address]    Special Requests   Final    BOTTLES DRAWN AEROBIC AND ANAEROBIC Blood Culture results may not be optimal due to an inadequate volume of blood received in culture bottles Performed at [facility], [address] [place], [place] [address]    Culture  Setup Time   Final    GRAM POSITIVE COCCI IN BOTH AEROBIC AND ANAEROBIC BOTTLES Gram Stain Report Called to,Read Back By and Verified With: DAVIS,M (MHP) AT [id number] ON 8.9.22 BY RUCINSKI,B Performed at [facility], READ BACK BY AND VERIFIED WITH: Casandra Doffing RN 2014 05/26/21 A BROWNING    Culture (A)  Final    MICROCOCCUS LUTEUS/LYLAE THE SIGNIFICANCE OF ISOLATING THIS ORGANISM FROM A  SINGLE SET OF BLOOD CULTURES WHEN MULTIPLE SETS ARE DRAWN IS UNCERTAIN. PLEASE NOTIFY THE MICROBIOLOGY DEPARTMENT WITHIN ONE WEEK IF SPECIATION AND SENSITIVITIES ARE REQUIRED. Performed at [facility], [place] [address], [place], [place] [address]    Report Status 06/01/2021 FINAL  Final  Blood Culture ID Panel  (Reflexed)     Status: None   Collection Time: 05/25/21  8:33 AM  Result Value Ref Range Status   Enterococcus faecalis NOT DETECTED NOT DETECTED Final   Enterococcus Faecium NOT DETECTED NOT DETECTED Final   Listeria monocytogenes NOT DETECTED NOT DETECTED Final   Staphylococcus species NOT DETECTED NOT DETECTED Final   Staphylococcus aureus (BCID) NOT DETECTED NOT DETECTED Final   Staphylococcus epidermidis NOT DETECTED NOT DETECTED Final   Staphylococcus lugdunensis NOT DETECTED NOT DETECTED Final   Streptococcus species NOT DETECTED NOT DETECTED Final   Streptococcus agalactiae NOT DETECTED NOT DETECTED Final   Streptococcus pneumoniae NOT DETECTED NOT DETECTED Final   Streptococcus pyogenes NOT DETECTED NOT DETECTED Final   A.calcoaceticus-baumannii NOT DETECTED NOT DETECTED Final   Bacteroides fragilis NOT DETECTED NOT DETECTED Final   Enterobacterales NOT DETECTED NOT DETECTED Final   Enterobacter cloacae complex NOT DETECTED NOT DETECTED Final   Escherichia coli NOT DETECTED NOT DETECTED Final   Klebsiella aerogenes NOT DETECTED NOT DETECTED Final   Klebsiella oxytoca NOT DETECTED NOT DETECTED Final   Klebsiella pneumoniae NOT DETECTED NOT DETECTED Final   Proteus species NOT DETECTED NOT DETECTED Final   Salmonella species NOT DETECTED NOT DETECTED Final   Serratia marcescens NOT DETECTED NOT DETECTED Final   Haemophilus influenzae NOT DETECTED NOT DETECTED Final   Neisseria meningitidis NOT DETECTED NOT DETECTED Final   Pseudomonas aeruginosa NOT DETECTED NOT DETECTED Final   Stenotrophomonas maltophilia NOT DETECTED NOT DETECTED Final   Candida albicans NOT DETECTED NOT DETECTED Final   Candida auris NOT DETECTED NOT DETECTED Final   Candida glabrata NOT DETECTED NOT DETECTED Final   Candida krusei NOT DETECTED NOT DETECTED Final   Candida parapsilosis NOT DETECTED NOT DETECTED Final   Candida tropicalis NOT DETECTED NOT DETECTED Final   Cryptococcus neoformans/gattii  NOT DETECTED NOT DETECTED Final    Comment: Performed at [facility], [place] [address], [place], [place] [address]  Culture, blood (routine x 2)     Status: None   Collection Time: 05/25/21  8:38 AM   Specimen: BLOOD LEFT HAND  Result Value Ref Range Status   Specimen Description BLOOD LEFT HAND  Final   Special Requests   Final    BOTTLES DRAWN AEROBIC AND ANAEROBIC Blood Culture adequate volume   Culture   Final    NO GROWTH 5 DAYS Performed at [facility], [address], [place], [place] [address]    Report Status 05/30/2021 FINAL  Final  Urine Culture     Status: Abnormal   Collection Time: 05/25/21  9:39 AM   Specimen: Urine, Clean Catch  Result Value Ref Range Status   Specimen Description   Final    URINE, CLEAN CATCH Performed at [facility], [address], [place], [place] [address]    Special Requests   Final    NONE Performed at [facility], [address], [place], [place] [address]    Culture MULTIPLE SPECIES PRESENT, SUGGEST RECOLLECTION (A)  Final   Report Status 05/27/2021 FINAL  Final  Resp Panel by RT-PCR (Flu A&B, Covid) Nasopharyngeal Swab     Status: None   Collection Time: 05/28/21  4:12 PM   Specimen: Nasopharyngeal Swab; Nasopharyngeal(NP) swabs in vial transport medium  Result Value Ref Range Status   SARS Coronavirus 2 by RT PCR NEGATIVE NEGATIVE Final    Comment: (NOTE) SARS-CoV-2 target nucleic acids are NOT DETECTED.  The SARS-CoV-2 RNA is generally detectable in upper respiratory specimens during the acute phase of infection. The lowest concentration of SARS-CoV-2 viral copies this assay can detect is 138 copies/mL. A negative result does not preclude SARS-Cov-2 infection and should not be used as the sole basis for treatment or other patient management decisions. A negative result may occur with  improper specimen collection/handling, submission of specimen other than nasopharyngeal swab, presence of viral mutation(s) within the areas  targeted by this assay, and inadequate number of viral copies(<138 copies/mL). A negative result must be combined with clinical observations, patient history, and epidemiological information. The expected result is Negative.  Fact Sheet for Patients:  EntrepreneurPulse.com.au  Fact Sheet for Healthcare Providers:  IncredibleEmployment.be  This test is no t yet approved or cleared by the [place] FDA and  has been authorized for detection and/or diagnosis of SARS-CoV-2 by FDA under an Emergency Use Authorization (EUA). This EUA will remain  in effect (meaning this test can be used) for the duration of the COVID-19 declaration under Section 564(b)(1) of the Act, 21 U.S.C.section 360bbb-3(b)(1), unless the authorization is terminated  or revoked sooner.       Influenza A by PCR NEGATIVE NEGATIVE Final   Influenza B by PCR NEGATIVE NEGATIVE Final    Comment: (NOTE) The Xpert Xpress SARS-CoV-2/FLU/RSV plus assay is intended as an aid in the diagnosis of influenza from Nasopharyngeal swab specimens and should not be used as a sole basis for treatment. Nasal washings and aspirates are unacceptable for Xpert Xpress SARS-CoV-2/FLU/RSV testing.  Fact Sheet for Patients: EntrepreneurPulse.com.au  Fact Sheet for Healthcare Providers: IncredibleEmployment.be  This test is not yet approved or cleared by the [place] FDA and has been authorized for detection and/or diagnosis of SARS-CoV-2 by FDA under an Emergency Use Authorization (EUA). This EUA will remain in effect (meaning this test can be used) for the duration of the COVID-19 declaration under Section 564(b)(1) of the Act, 21 U.S.C. section 360bbb-3(b)(1), unless the authorization is terminated or revoked.  Performed at [facility], [address], [place], [place] [address]   Gastrointestinal Panel by PCR , Stool     Status: None   Collection Time:  05/31/21 10:16 AM   Specimen: Stool  Result Value Ref Range Status   Campylobacter species NOT DETECTED NOT DETECTED Final   Plesimonas shigelloides NOT DETECTED NOT DETECTED Final   Salmonella species NOT DETECTED NOT DETECTED Final   Yersinia enterocolitica NOT DETECTED NOT DETECTED Final   Vibrio species NOT DETECTED NOT DETECTED Final   Vibrio cholerae NOT DETECTED NOT DETECTED Final   Enteroaggregative E coli (EAEC) NOT DETECTED NOT DETECTED Final   Enteropathogenic E coli (EPEC) NOT DETECTED NOT DETECTED Final   Enterotoxigenic E coli (ETEC) NOT DETECTED NOT DETECTED Final   Shiga like toxin producing E coli (STEC) NOT DETECTED NOT DETECTED Final   Shigella/Enteroinvasive E coli (EIEC) NOT DETECTED NOT DETECTED Final   Cryptosporidium NOT DETECTED NOT DETECTED Final   Cyclospora cayetanensis NOT DETECTED NOT DETECTED Final   Entamoeba histolytica NOT DETECTED NOT DETECTED Final   Giardia lamblia NOT DETECTED NOT DETECTED Final   Adenovirus F40/41 NOT DETECTED NOT DETECTED Final   Astrovirus NOT DETECTED NOT DETECTED Final   Norovirus  GI/GII NOT DETECTED NOT DETECTED Final   Rotavirus A NOT DETECTED NOT DETECTED Final   Sapovirus (I, II, IV, and V) NOT DETECTED NOT DETECTED Final    Comment: Performed at [facility], [place], [place] [address]  C Difficile Quick Screen w PCR reflex     Status: None   Collection Time: 05/31/21 12:30 PM   Specimen: STOOL  Result Value Ref Range Status   C Diff antigen NEGATIVE NEGATIVE Final   C Diff toxin NEGATIVE NEGATIVE Final   C Diff interpretation No C. difficile detected.  Final    Comment: Performed at [facility], [address] [place], [place] [address]     Time coordinating discharge: 51mns  SIGNED:   JKathie Dike MD  [facility] 06/03/2021, 8:20 PM   If 7PM-7AM, please contact night-coverage [email or web address]

## 2021-06-16 ENCOUNTER — Encounter: Payer: Self-pay | Admitting: Orthopaedic Surgery

## 2021-06-16 ENCOUNTER — Ambulatory Visit (INDEPENDENT_AMBULATORY_CARE_PROVIDER_SITE_OTHER): Payer: Medicare Other | Admitting: Orthopaedic Surgery

## 2021-06-16 ENCOUNTER — Other Ambulatory Visit: Payer: Self-pay

## 2021-06-16 DIAGNOSIS — G8191 Hemiplegia, unspecified affecting right dominant side: Secondary | ICD-10-CM | POA: Insufficient documentation

## 2021-06-16 DIAGNOSIS — M25561 Pain in right knee: Secondary | ICD-10-CM

## 2021-06-16 DIAGNOSIS — G8929 Other chronic pain: Secondary | ICD-10-CM

## 2021-06-16 DIAGNOSIS — I10 Essential (primary) hypertension: Secondary | ICD-10-CM | POA: Insufficient documentation

## 2021-06-16 DIAGNOSIS — R531 Weakness: Secondary | ICD-10-CM

## 2021-06-16 MED ORDER — HYDROCODONE-ACETAMINOPHEN 5-325 MG PO TABS: ORAL_TABLET | ORAL | 0 refills | Status: DC

## 2021-06-16 NOTE — Progress Notes (Signed)
PROCEDURE NOTE:  The patient requests injections of the right knee , verbal consent was obtained.  The right knee was prepped appropriately after time out was performed.   Sterile technique was observed and injection of 1 cc of Celestone '6mg'$  with several cc's of plain xylocaine. Anesthesia was provided by ethyl chloride and a 20-gauge needle was used to inject the knee area. The injection was tolerated well.  A band aid dressing was applied.  The patient was advised to apply ice later today and tomorrow to the injection sight as needed.   I have reviewed the [facility] web site prior to prescribing narcotic medicine for this patient.  Call if any problem.  Precautions discussed.  Return in six weeks.  Electronically Signed Sanjuana Kava, MD 8/30/20229:16 AM

## 2021-07-10 ENCOUNTER — Encounter (HOSPITAL_COMMUNITY): Payer: Self-pay | Admitting: Emergency Medicine

## 2021-07-10 ENCOUNTER — Emergency Department (HOSPITAL_COMMUNITY)
Admission: EM | Admit: 2021-07-10 | Discharge: 2021-07-10 | Disposition: A | Discharge status: Home / Self Care | Payer: Medicare Other | Attending: Emergency Medicine | Admitting: Emergency Medicine

## 2021-07-10 DIAGNOSIS — J45909 Unspecified asthma, uncomplicated: Secondary | ICD-10-CM | POA: Diagnosis not present

## 2021-07-10 DIAGNOSIS — Z87891 Personal history of nicotine dependence: Secondary | ICD-10-CM | POA: Diagnosis not present

## 2021-07-10 DIAGNOSIS — I10 Essential (primary) hypertension: Secondary | ICD-10-CM | POA: Diagnosis not present

## 2021-07-10 DIAGNOSIS — U071 COVID-19: Secondary | ICD-10-CM | POA: Insufficient documentation

## 2021-07-10 DIAGNOSIS — Z79899 Other long term (current) drug therapy: Secondary | ICD-10-CM | POA: Insufficient documentation

## 2021-07-10 DIAGNOSIS — J029 Acute pharyngitis, unspecified: Secondary | ICD-10-CM | POA: Diagnosis present

## 2021-07-10 LAB — RESP PANEL BY RT-PCR (FLU A&B, COVID) ARPGX2
Influenza A by PCR: NEGATIVE
Influenza B by PCR: NEGATIVE
SARS Coronavirus 2 by RT PCR: POSITIVE — AB

## 2021-07-10 LAB — GROUP A STREP BY PCR: Group A Strep by PCR: NOT DETECTED

## 2021-07-10 MED ORDER — NIRMATRELVIR/RITONAVIR (PAXLOVID)TABLET: 3.0000 | ORAL_TABLET | Freq: Two times a day (BID) | ORAL | 0 refills | Status: AC

## 2021-07-10 NOTE — Discharge Instructions (Addendum)
Begin taking Paxlovid as prescribed.  Take Tylenol 650 mg rotated with ibuprofen 400 mg every 4 hours as needed for pain or fever.  Drink plenty of fluids and get plenty of rest.  Isolate at home for the next 5 days.

## 2021-07-10 NOTE — ED Provider Notes (Signed)
[facility] EMERGENCY DEPARTMENT Provider Note   CSN: [id number] Arrival date & time: 07/10/21  0055     History Chief Complaint  Patient presents with   Sore Throat    [name] is a 65 y.o. female.  Patient is a 65 year old female with past medical history of hypertension, GERD, asthma.  Patient presenting today for evaluation of sore throat.  This has been worsening over the past 2 days.  She denies any fevers or chills.  She denies any sick contacts or exposures.  She denies chest pain or difficulty breathing.  The history is provided by the patient.  Sore Throat This is a new problem. The current episode started 2 days ago. The problem occurs constantly. The problem has been gradually worsening. The symptoms are aggravated by swallowing. Nothing relieves the symptoms. She has tried nothing for the symptoms.      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   Acute lower UTI 05/28/2021   Severe sepsis ([place]) 05/28/2021   Lactic acidosis 12/19/2020   Encephalopathy acute 99/37/1696   Acute metabolic encephalopathy 78/93/8101   Acute blood loss anemia 11/02/2019   Overweight (BMI 25.0-29.9) 11/02/2019   Gait disturbance    Rectal bleeding 10/08/2019   Hyponatremia    Diarrhea 04/11/2019   Nausea, vomiting, and diarrhea    ARF (acute renal failure) ([place]) 04/10/2019   Acute kidney injury ([place]) 01/22/2019   History of stroke 01/22/2019   Asthma 01/22/2019   Gastroenteritis 01/22/2019   Stroke ([place]) 06/14/2018   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 07/25/2007   Seizures ([place]) 07/25/2007    Past Surgical History:  Procedure Laterality Date   ABDOMINAL HYSTERECTOMY  2004   complete   BRAIN SURGERY     age 78 stroke   COLONOSCOPY     COLONOSCOPY WITH PROPOFOL  10/03/2012   Dr. [name]: moderate diverticulosis, small internal hemorrhoids, TUBULAR ADENOMA. Next colonoscopy 2023 per Dr. [name].   ESOPHAGOGASTRODUODENOSCOPY N/A 10/18/2014   Dr. Gala Romney during inpatient hospitalization: severe exudative/erosive reflux esophagitis as source of trivial upper GI bleed. No varices    ESOPHAGOGASTRODUODENOSCOPY (EGD) WITH PROPOFOL  10/03/2012   Dr. [name]: stricture at [place] junction s/p dilation, mild gastritis negative H.pylori    ESOPHAGOGASTRODUODENOSCOPY (EGD) WITH PROPOFOL N/A 12/27/2017   Grade D esophagitis. Medium-sized hiatal hernia, normal duodenum   right tib-fib fracture  2008   SAVORY DILATION  10/03/2012   Procedure: SAVORY DILATION;  Surgeon: Carlyon Prows  Rexene Edison, MD;  Location: [facility];  Service: Endoscopy;  Laterality: N/A;  started at 1303,  dilated 12.8-16mm     OB History     Gravida  4   Para  3   Term  3   Preterm      AB  1   Living  3      SAB  1   IAB      Ectopic      Multiple      Live Births              Family History  Problem Relation Age of Onset   Liver disease Sister        etoh   Colon cancer Neg Hx     Social History   Tobacco Use   Smoking status: Former    Packs/day: 0.04    Years: 9.00    Pack years:  0.36    Types: Cigarettes    Quit date: 07/28/2014    Years since quitting: 6.9   Smokeless tobacco: Never  Vaping Use   Vaping Use: Never used  Substance Use Topics   Alcohol use: Yes    Alcohol/week: 35.0 standard drinks    Types: 35 Cans of beer per week    Comment: Patient reports she drinks "a lot" of beer each day; 01/07/21 "1 or 2 at night"   Drug use: No    Home Medications Prior to Admission medications   Medication Sig Start Date End Date Taking? Authorizing Provider  albuterol (PROVENTIL HFA;VENTOLIN HFA) 108 (90 BASE) MCG/ACT inhaler Inhale 2 puffs into the lungs every 6 (six) hours as needed for wheezing or shortness of breath.     [provider]  ENSURE PLUS (ENSURE PLUS) LIQD Take 237 mLs by mouth 3 (three) times daily between meals.    [provider]  folic acid (FOLVITE) 428 MCG tablet Take 400 mcg by mouth daily.    [provider]  HYDROcodone-acetaminophen (NORCO/VICODIN) 5-325 MG tablet One tablet every six hours for pain.  Limit 7 days. 06/16/21   Sanjuana Kava, MD  lisinopril (ZESTRIL) 40 MG tablet Take 1 tablet (40 mg total) by mouth daily. 10/09/19   Roxan Hockey, MD  loperamide (IMODIUM) 2 MG capsule Take 1 capsule (2 mg total) by mouth 3 (three) times daily as needed for diarrhea or loose stools. 06/03/21   Kathie Dike, MD  pantoprazole (PROTONIX) 40 MG tablet Take 1 tablet (40 mg total) by mouth 2 (two) times daily before a meal. 11/05/19   Gosrani, Nimish C, MD  PHENobarbital (LUMINAL) 32.4 MG tablet Take 32.4 mg by mouth 2 (two) times daily. 07/13/19   [provider]  phenytoin (DILANTIN) 100 MG ER capsule Take 100 mg by mouth 3 (three) times daily.    [provider]  potassium chloride SA (KLOR-CON) 20 MEQ tablet Take 20 mEq by mouth 3 (three) times daily. 08/25/20   [provider]  solifenacin (VESICARE) 5 MG tablet Take 5 mg by mouth daily. 03/11/20   [provider]    Allergies     Patient has no known allergies.  Review of Systems   Review of Systems  All other systems reviewed and are negative.  Physical Exam Updated Vital Signs BP (!) 111/54 (BP Location: Left Arm)   Pulse 82   Temp 99 F (37.2 C) (Oral)   Resp 17   Ht 5\' 4"  (1.626 m)   Wt 66.9 kg  SpO2 100%   BMI 25.32 kg/m   Physical Exam Vitals and nursing note reviewed.  Constitutional:      General: She is not in acute distress.    Appearance: She is well-developed. She is not diaphoretic.  HENT:     Head: Normocephalic and atraumatic.     Mouth/Throat:     Mouth: Mucous membranes are moist.     Pharynx: No oropharyngeal exudate or posterior oropharyngeal erythema.  Cardiovascular:     Rate and Rhythm: Normal rate and regular rhythm.     Heart sounds: No murmur heard.   No friction rub. No gallop.  Pulmonary:     Effort: Pulmonary effort is normal. No respiratory distress.     Breath sounds: Normal breath sounds. No wheezing.  Abdominal:     General: Bowel sounds are normal. There is no distension.     Palpations: Abdomen is soft.     Tenderness: There is no abdominal tenderness.  Musculoskeletal:        General: Normal range of motion.     Cervical back: Normal range of motion and neck supple.  Lymphadenopathy:     Cervical: No cervical adenopathy.  Skin:    General: Skin is warm and dry.  Neurological:     General: No focal deficit present.     Mental Status: She is alert and oriented to person, place, and time.    ED Results / Procedures / Treatments   Labs (all labs ordered are listed, but only abnormal results are displayed) Labs Reviewed  RESP PANEL BY RT-PCR (FLU A&B, COVID) [id number]  GROUP A STREP BY PCR    EKG None  Radiology No results found.  Procedures Procedures   Medications Ordered in ED Medications - No data to display  ED Course  I have reviewed the triage vital signs and the nursing notes.  Pertinent labs & imaging results that were available  during my care of the patient were reviewed by me and considered in my medical decision making (see chart for details).    MDM Rules/Calculators/A&P  COVID test is positive.  Due to comorbidities, patient would like treatment with Paxlovid.  This will be prescribed and patient to be discharged home.  There is no hypoxia, she is nontoxic-appearing, and I see no indication for admission.  Final Clinical Impression(s) / ED Diagnoses Final diagnoses:  None    Rx / DC Orders ED Discharge Orders     None        Veryl Speak, MD 07/10/21 [phone]

## 2021-07-10 NOTE — ED Triage Notes (Signed)
Pt from home c/o sore throat for the past 2 days.

## 2021-07-28 ENCOUNTER — Other Ambulatory Visit: Payer: Self-pay

## 2021-07-28 ENCOUNTER — Encounter: Payer: Self-pay | Admitting: Orthopaedic Surgery

## 2021-07-28 ENCOUNTER — Ambulatory Visit (INDEPENDENT_AMBULATORY_CARE_PROVIDER_SITE_OTHER): Payer: Medicare Other | Admitting: Orthopaedic Surgery

## 2021-07-28 DIAGNOSIS — G8929 Other chronic pain: Secondary | ICD-10-CM

## 2021-07-28 DIAGNOSIS — M25561 Pain in right knee: Secondary | ICD-10-CM

## 2021-07-28 DIAGNOSIS — R531 Weakness: Secondary | ICD-10-CM

## 2021-07-28 MED ORDER — HYDROCODONE-ACETAMINOPHEN 5-325 MG PO TABS: ORAL_TABLET | ORAL | 0 refills | Status: DC

## 2021-07-28 NOTE — Progress Notes (Signed)
PROCEDURE NOTE:  The patient requests injections of the right knee , verbal consent was obtained.  The right knee was prepped appropriately after time out was performed.   Sterile technique was observed and injection of 1 cc of Celestone 6mg  with several cc's of plain xylocaine. Anesthesia was provided by ethyl chloride and a 20-gauge needle was used to inject the knee area. The injection was tolerated well.  A band aid dressing was applied.  The patient was advised to apply ice later today and tomorrow to the injection sight as needed.   Encounter Diagnoses  Name Primary?   Chronic pain of right knee Yes   Right sided weakness    Return in two months.  I have reviewed the [place] web site prior to prescribing narcotic medicine for this patient.  Call if any problem.  Precautions discussed.  Electronically Signed Sanjuana Kava, MD 10/11/20229:01 AM

## 2021-08-26 ENCOUNTER — Encounter: Payer: Self-pay | Admitting: Internal Medicine

## 2021-09-29 ENCOUNTER — Other Ambulatory Visit: Payer: Self-pay

## 2021-09-29 ENCOUNTER — Ambulatory Visit (INDEPENDENT_AMBULATORY_CARE_PROVIDER_SITE_OTHER): Payer: Medicare Other | Admitting: Orthopaedic Surgery

## 2021-09-29 ENCOUNTER — Encounter: Payer: Self-pay | Admitting: Orthopaedic Surgery

## 2021-09-29 DIAGNOSIS — M25561 Pain in right knee: Secondary | ICD-10-CM

## 2021-09-29 DIAGNOSIS — R531 Weakness: Secondary | ICD-10-CM

## 2021-09-29 DIAGNOSIS — G8929 Other chronic pain: Secondary | ICD-10-CM

## 2021-09-29 MED ORDER — HYDROCODONE-ACETAMINOPHEN 5-325 MG PO TABS: ORAL_TABLET | ORAL | 0 refills | Status: DC

## 2021-09-29 NOTE — Progress Notes (Signed)
PROCEDURE NOTE:  The patient requests injections of the right knee , verbal consent was obtained.  The right knee was prepped appropriately after time out was performed.   Sterile technique was observed and injection of 1 cc of DepoMedrol 40mg  with several cc's of plain xylocaine. Anesthesia was provided by ethyl chloride and a 20-gauge needle was used to inject the knee area. The injection was tolerated well.  A band aid dressing was applied.  The patient was advised to apply ice later today and tomorrow to the injection sight as needed.   Encounter Diagnoses  Name Primary?   Chronic pain of right knee Yes   Right sided weakness    Return in two months.  Call if any problem.  Precautions discussed.  Electronically Signed Sanjuana Kava, MD 12/13/20228:56 AM

## 2021-10-14 ENCOUNTER — Ambulatory Visit: Payer: Medicare Other | Admitting: Gastroenterology

## 2021-10-14 ENCOUNTER — Encounter: Payer: Self-pay | Admitting: Internal Medicine

## 2021-10-14 NOTE — Progress Notes (Deleted)
History of cirrhosis due to alcohol,  GERD, chronic alternating constipation and diarrhea, chronic normocytic anemia, history of rectal bleeding with need for colonoscopy but has canceled due to eating and then no-show for pre-op. Last colonoscopy in 2013. History of gallbladder polyp on [place].    CTA in Aug 2022 with possible diverticulitis at distal descending and proximal sigmoid colon.

## 2021-10-16 ENCOUNTER — Encounter (HOSPITAL_COMMUNITY): Payer: Self-pay

## 2021-10-16 ENCOUNTER — Emergency Department (HOSPITAL_COMMUNITY)
Admission: EM | Admit: 2021-10-16 | Discharge: 2021-10-17 | Disposition: A | Discharge status: Home / Self Care | Payer: Medicare Other | Attending: Emergency Medicine | Admitting: Emergency Medicine

## 2021-10-16 ENCOUNTER — Other Ambulatory Visit: Payer: Self-pay

## 2021-10-16 ENCOUNTER — Emergency Department (HOSPITAL_COMMUNITY): Payer: Medicare Other

## 2021-10-16 ENCOUNTER — Other Ambulatory Visit: Payer: Self-pay | Admitting: Gastroenterology

## 2021-10-16 DIAGNOSIS — R4182 Altered mental status, unspecified: Secondary | ICD-10-CM | POA: Diagnosis not present

## 2021-10-16 DIAGNOSIS — Z79899 Other long term (current) drug therapy: Secondary | ICD-10-CM | POA: Insufficient documentation

## 2021-10-16 DIAGNOSIS — K5792 Diverticulitis of intestine, part unspecified, without perforation or abscess without bleeding: Secondary | ICD-10-CM

## 2021-10-16 DIAGNOSIS — R1084 Generalized abdominal pain: Secondary | ICD-10-CM | POA: Insufficient documentation

## 2021-10-16 DIAGNOSIS — F10920 Alcohol use, unspecified with intoxication, uncomplicated: Secondary | ICD-10-CM

## 2021-10-16 DIAGNOSIS — I10 Essential (primary) hypertension: Secondary | ICD-10-CM | POA: Diagnosis not present

## 2021-10-16 DIAGNOSIS — J45909 Unspecified asthma, uncomplicated: Secondary | ICD-10-CM | POA: Insufficient documentation

## 2021-10-16 DIAGNOSIS — Z87891 Personal history of nicotine dependence: Secondary | ICD-10-CM | POA: Insufficient documentation

## 2021-10-16 LAB — CBC
HCT: 30.3 % — ABNORMAL LOW (ref 36.0–46.0)
Hemoglobin: 9.9 g/dL — ABNORMAL LOW (ref 12.0–15.0)
MCH: 29.2 pg (ref 26.0–34.0)
MCHC: 32.7 g/dL (ref 30.0–36.0)
MCV: 89.4 fL (ref 80.0–100.0)
Platelets: 180 10*3/uL (ref 150–400)
RBC: 3.39 MIL/uL — ABNORMAL LOW (ref 3.87–5.11)
RDW: 15.4 % (ref 11.5–15.5)
WBC: 5.8 10*3/uL (ref 4.0–10.5)
nRBC: 0 % (ref 0.0–0.2)

## 2021-10-16 LAB — COMPREHENSIVE METABOLIC PANEL
ALT: 18 U/L (ref 0–44)
AST: 35 U/L (ref 15–41)
Albumin: 3.6 g/dL (ref 3.5–5.0)
Alkaline Phosphatase: 124 U/L (ref 38–126)
Anion gap: 11 (ref 5–15)
BUN: 5 mg/dL — ABNORMAL LOW (ref 8–23)
CO2: 24 mmol/L (ref 22–32)
Calcium: 8.5 mg/dL — ABNORMAL LOW (ref 8.9–10.3)
Chloride: 97 mmol/L — ABNORMAL LOW (ref 98–111)
Creatinine, Ser: 0.64 mg/dL (ref 0.44–1.00)
GFR, Estimated: >60 mL/min (ref >60)
Glucose, Bld: 95 mg/dL (ref 70–99)
Potassium: 3.6 mmol/L (ref 3.5–5.1)
Sodium: 132 mmol/L — ABNORMAL LOW (ref 135–145)
Total Bilirubin: 0.3 mg/dL (ref 0.3–1.2)
Total Protein: 7.9 g/dL (ref 6.5–8.1)

## 2021-10-16 LAB — ETHANOL: Alcohol, Ethyl (B): 286 mg/dL — ABNORMAL HIGH (ref <10)

## 2021-10-16 LAB — LIPASE, BLOOD: Lipase: 33 U/L (ref 11–51)

## 2021-10-16 NOTE — ED Triage Notes (Signed)
Pt brought in by EMS for abd pain. Pt has ETOH on board per EMS. Pt appears intoxicated. Pt is not answering questions in triage. Pt states she is having abd pain, no further description given.

## 2021-10-16 NOTE — ED Provider Notes (Signed)
[facility] EMERGENCY DEPARTMENT Provider Note   CSN: [id number] Arrival date & time: 10/16/21  2114     History Chief Complaint  Patient presents with   Abdominal Pain    [name] is a 65 y.o. female.  Level 5 caveat for apparent intoxication.  Patient brought in by EMS with diffuse abdominal pain ongoing for several days.  She is unable to give much of a history.  e appears intoxicated and mumbles a few words and not answering questions very well in triage.  She holds up 3 fingers when asked how many days she has had the pain and how many times she has vomited.  No reported fever.  No reported chest pain or shortness of breath.  Does have some pain with urination and blood in the urine.  Admits to alcohol use tonight.  Denies any chest pain or shortness of breath. Previous stroke with right-sided deficits. History of hypertension, seizures, alcohol abuse, GERD.  The history is provided by the patient. The history is limited by the condition of the patient.  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   Acute lower UTI 05/28/2021   Severe sepsis ([place]) 05/28/2021   Lactic acidosis 12/19/2020   Encephalopathy acute 62/22/9798   Acute metabolic encephalopathy 92/08/9416   Acute blood loss anemia 11/02/2019   Overweight (BMI 25.0-29.9) 11/02/2019   Gait disturbance    Rectal bleeding 10/08/2019   Hyponatremia    Diarrhea 04/11/2019   Nausea, vomiting, and diarrhea    ARF (acute renal failure) ([place]) 04/10/2019    Acute kidney injury ([place]) 01/22/2019   History of stroke 01/22/2019   Asthma 01/22/2019   Gastroenteritis 01/22/2019   Stroke ([place]) 06/14/2018   Cirrhosis of liver without ascites ([place]) 04/21/2017   AKI (acute kidney injury) ([place]) 07/30/2016   Anemia 11/25/2015   Abnormal liver function    Coffee ground emesis    Reflux esophagitis    Sinus tachycardia ([place]) 10/10/2014   Hypokalemia 10/10/2014   Seizure disorder ([place]) 10/10/2014   Alcohol abuse 09/05/2012   Odynophagia 09/05/2012   GERD (gastroesophageal reflux disease) 09/05/2012   Abdominal pain 09/05/2012   FX CLOSED TIBIA NOS 07/27/2007   STROKE-With also h/o Aneurysm clipping? 07/25/2007   Seizures ([place]) 07/25/2007    Past Surgical History:  Procedure Laterality Date   ABDOMINAL HYSTERECTOMY  2004   complete   BRAIN SURGERY     age 51 stroke   COLONOSCOPY     COLONOSCOPY WITH PROPOFOL  10/03/2012   Dr. [name]: moderate diverticulosis, small internal hemorrhoids, TUBULAR ADENOMA. Next colonoscopy 2023 per Dr. [name].   ESOPHAGOGASTRODUODENOSCOPY N/A 10/18/2014   Dr. Gala Romney during inpatient hospitalization: severe exudative/erosive reflux esophagitis as source of trivial upper GI bleed. No varices    ESOPHAGOGASTRODUODENOSCOPY (EGD) WITH PROPOFOL  10/03/2012   Dr. [name]: stricture at [place] junction s/p dilation, mild gastritis negative H.pylori  ESOPHAGOGASTRODUODENOSCOPY (EGD) WITH PROPOFOL N/A 12/27/2017   Grade D esophagitis. Medium-sized hiatal hernia, normal duodenum   right tib-fib fracture  2008   SAVORY DILATION  10/03/2012   Procedure: SAVORY DILATION;  Surgeon: Danie Binder, MD;  Location: [facility];  Service: Endoscopy;  Laterality: N/A;  started at 1303,  dilated 12.8-16mm     OB History     Gravida  4   Para  3   Term  3   Preterm      AB  1   Living  3      SAB  1   IAB      Ectopic      Multiple      Live Births              Family History  Problem Relation Age of Onset   Liver  disease Sister        etoh   Colon cancer Neg Hx     Social History   Tobacco Use   Smoking status: Former    Packs/day: 0.04    Years: 9.00    Pack years: 0.36    Types: Cigarettes    Quit date: 07/28/2014    Years since quitting: 7.2   Smokeless tobacco: Never  Vaping Use   Vaping Use: Never used  Substance Use Topics   Alcohol use: Yes    Alcohol/week: 35.0 standard drinks    Types: 35 Cans of beer per week    Comment: Patient reports she drinks "a lot" of beer each day; 01/07/21 "1 or 2 at night"   Drug use: No    Home Medications Prior to Admission medications   Medication Sig Start Date End Date Taking? Authorizing Provider  albuterol (PROVENTIL HFA;VENTOLIN HFA) 108 (90 BASE) MCG/ACT inhaler Inhale 2 puffs into the lungs every 6 (six) hours as needed for wheezing or shortness of breath.     [provider]  ENSURE PLUS (ENSURE PLUS) LIQD Take 237 mLs by mouth 3 (three) times daily between meals.    [provider]  folic acid (FOLVITE) 998 MCG tablet Take 400 mcg by mouth daily.    [provider]  HYDROcodone-acetaminophen (NORCO/VICODIN) 5-325 MG tablet One tablet every six hours for pain.  Limit 7 days. 09/29/21   Sanjuana Kava, MD  lisinopril (ZESTRIL) 40 MG tablet Take 1 tablet (40 mg total) by mouth daily. 10/09/19   Roxan Hockey, MD  loperamide (IMODIUM) 2 MG capsule Take 1 capsule (2 mg total) by mouth 3 (three) times daily as needed for diarrhea or loose stools. 06/03/21   Kathie Dike, MD  pantoprazole (PROTONIX) 40 MG tablet Take 1 tablet (40 mg total) by mouth 2 (two) times daily before a meal. 11/05/19   Gosrani, Nimish C, MD  PHENobarbital (LUMINAL) 32.4 MG tablet Take 32.4 mg by mouth 2 (two) times daily. 07/13/19   [provider]  phenytoin (DILANTIN) 100 MG ER capsule Take 100 mg by mouth 3 (three) times daily.    [provider]  potassium chloride SA (KLOR-CON) 20 MEQ tablet Take 20 mEq by mouth 3  (three) times daily. 08/25/20   [provider]  solifenacin (VESICARE) 5 MG tablet Take 5 mg by mouth daily. 03/11/20   [provider]    Allergies    Patient has no known allergies.  Review of Systems   Review of Systems  Unable to perform ROS: Patient nonverbal  Gastrointestinal:  Positive for abdominal  pain.   Physical Exam Updated Vital Signs BP (!) 100/57    Pulse 91    Temp 97.9 F (36.6 C) (Oral)    Resp 20    Ht 5\' 4"  (1.626 m)    Wt 66.9 kg    SpO2 100%    BMI 25.32 kg/m   Physical Exam Vitals and nursing note reviewed.  Constitutional:      General: She is not in acute distress.    Appearance: She is well-developed.     Comments: Mumbles a few words, does not answer questions appropriately.  HENT:     Head: Normocephalic and atraumatic.     Mouth/Throat:     Pharynx: No oropharyngeal exudate.  Eyes:     Conjunctiva/sclera: Conjunctivae normal.     Pupils: Pupils are equal, round, and reactive to light.  Neck:     Comments: No meningismus. Cardiovascular:     Rate and Rhythm: Normal rate and regular rhythm.     Heart sounds: Normal heart sounds. No murmur heard. Pulmonary:     Effort: Pulmonary effort is normal. No respiratory distress.     Breath sounds: Normal breath sounds.  Chest:     Chest wall: No tenderness.  Abdominal:     Palpations: Abdomen is soft.     Tenderness: There is abdominal tenderness. There is no guarding or rebound.     Comments: Mild diffuse tenderness, no guarding or rebound  Musculoskeletal:        General: No tenderness. Normal range of motion.     Cervical back: Normal range of motion and neck supple.  Skin:    General: Skin is warm.     Coloration: Skin is not pale.  Neurological:     Mental Status: She is alert. Mental status is at baseline.     Cranial Nerves: No cranial nerve deficit.     Motor: No abnormal muscle tone.     Coordination: Coordination normal.     Comments: Right-sided weakness at  baseline.  5/5 strength on the left  Psychiatric:        Behavior: Behavior normal.    ED Results / Procedures / Treatments   Labs (all labs ordered are listed, but only abnormal results are displayed) Labs Reviewed  COMPREHENSIVE METABOLIC PANEL - Abnormal; Notable for the following components:      Result Value   Sodium 132 (*)    Chloride 97 (*)    BUN 5 (*)    Calcium 8.5 (*)    All other components within normal limits  CBC - Abnormal; Notable for the following components:   RBC 3.39 (*)    Hemoglobin 9.9 (*)    HCT 30.3 (*)    All other components within normal limits  ETHANOL - Abnormal; Notable for the following components:   Alcohol, Ethyl (B) 286 (*)    All other components within normal limits  LIPASE, BLOOD  URINALYSIS, ROUTINE W REFLEX MICROSCOPIC  TROPONIN I (HIGH SENSITIVITY)    EKG EKG Interpretation  Date/Time:  Friday October 16 2021 23:37:24 EST Ventricular Rate:  89 PR Interval:  184 QRS Duration: 89 QT Interval:  390 QTC Calculation: 475 R Axis:   40 Text Interpretation: Sinus rhythm Abnormal R-wave progression, early transition No significant change was found Confirmed by Ezequiel Essex [phone]) on 10/16/2021 11:41:52 PM  Radiology CT Head Wo Contrast  Result Date: 10/17/2021 CLINICAL DATA:  Altered mental status and abdominal pain. EXAM: CT HEAD WITHOUT CONTRAST TECHNIQUE: Contiguous  axial images were obtained from the base of the skull through the vertex without intravenous contrast. COMPARISON:  December 18, 2020 FINDINGS: Brain: There is mild cerebral atrophy with widening of the extra-axial spaces and ventricular dilatation. There are areas of decreased attenuation within the white matter tracts of the supratentorial brain, consistent with microvascular disease changes. Bilateral frontal lobe and left temporal lobe encephalomalacia is again noted, with ex vacuo dilatation of the left lateral ventricle. 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 Vascular: No hyperdense vessel or unexpected calcification. Skull: A right frontal craniotomy defect is again seen. Sinuses/Orbits: No acute finding. Other: None. IMPRESSION: 1. No acute intracranial abnormality. 2. Generalized cerebral atrophy with stable areas of bilateral frontal and right temporal encephalomalacia. 3. Prior aneurysm clipping. Electronically Signed   By: Virgina Norfolk M.D.   On: 10/17/2021 02:41   CT ABDOMEN PELVIS W CONTRAST  Result Date: 10/17/2021 CLINICAL DATA:  Abdominal pain. EXAM: CT ABDOMEN AND PELVIS WITH CONTRAST TECHNIQUE: Multidetector CT imaging of the abdomen and pelvis was performed using the standard protocol following bolus administration of intravenous contrast. CONTRAST:  163mL OMNIPAQUE IOHEXOL 300 MG/ML  SOLN COMPARISON:  May 28, 2021 FINDINGS: Lower chest: Very mild atelectasis is seen within the bilateral lung bases. Hepatobiliary: The liver is cirrhotic in appearance. No focal liver abnormality is seen. No gallstones, gallbladder wall thickening, or biliary dilatation. Pancreas: Unremarkable. No pancreatic ductal dilatation or surrounding inflammatory changes. Spleen: Normal in size without focal abnormality. Adrenals/Urinary Tract: Adrenal glands are unremarkable. Kidneys are normal in size, without focal lesions. Stable bilateral extrarenal pelvis prominence is noted. The urinary bladder is markedly distended. Stomach/Bowel: There is a small hiatal hernia. Appendix appears normal. No evidence of bowel dilatation. Mildly inflamed diverticula are seen within the mid descending colon with noninflamed diverticula noted throughout the remainder of the large bowel. There is no evidence of associated perforation or abscess. Vascular/Lymphatic: No significant vascular findings are present. No enlarged abdominal or pelvic lymph nodes. Reproductive: Status post hysterectomy. No adnexal masses. Other: No abdominal wall hernia or  abnormality. No abdominopelvic ascites. Musculoskeletal: Degenerative changes are seen throughout the lumbar spine. IMPRESSION: 1. Mild uncomplicated diverticulitis involving the mid descending colon. 2. Small hiatal hernia. 3. Cirrhotic liver. 4. Markedly distended urinary bladder. Electronically Signed   By: Virgina Norfolk M.D.   On: 10/17/2021 02:46    Procedures Procedures   Medications Ordered in ED Medications - No data to display  ED Course  I have reviewed the triage vital signs and the nursing notes.  Pertinent labs & imaging results that were available during my care of the patient were reviewed by me and considered in my medical decision making (see chart for details).    MDM Rules/Calculators/A&P                         Abdominal pain with nausea and vomiting and dysuria.  Vital stable, no distress.  Abdomen soft without peritoneal signs.  Labs show alcohol intoxication.  Stable anemia.  LFTs and lipase are normal.  Given patient's intoxication and lack of ability to provide a history, CT imaging was pursued.  CT head is stable. CT abdomen pelvis shows mild uncomplicated diverticulitis involving descending colon  At 3 AM, patient is able to give more of a history.  She is more sober.  States she been having left-sided abdominal pain for the past 3 days and multiple episodes of  nonbloody diarrhea.  No fever or vomiting.  Results discussed with patient.  She was treated with antibiotics for her diverticulitis.  Remains stable throughout ED course. Able to tolerate PO. Abdomen soft without peritoneal signs. Was not able to provide urine sample successfully and declines catheterization. She is being covered with antibiotics for diverticulitis which should cover any possible UTI as well.  Return to the ED with worsening symptoms.     Final Clinical Impression(s) / ED Diagnoses Final diagnoses:  None    Rx / DC Orders ED Discharge Orders     None        Archer Vise,  Annie Main, MD 10/17/21 [phone]

## 2021-10-16 NOTE — ED Provider Notes (Signed)
Emergency Medicine Provider Triage Evaluation Note  [name] , a 65 y.o. female  was evaluated in triage.  Pt complains of abdominal pain.  Generalized in nature.  States she has been drinking some alcohol.  She is unable to quantify.  Has had some nausea and NBNB vomiting.  No diarrhea, constipation.  Has right-sided deficits which she states is chronic.  Nuys melena or blood per rectum.  Review of Systems  Positive: Abdominal pain, emesis Negative: Fever, bloody stool, diarrhea  Physical Exam  BP (!) 104/56    Pulse 85    Temp 97.9 F (36.6 C) (Oral)    Resp 17    Ht 5\' 4"  (1.626 m)    Wt 66.9 kg    SpO2 100%    BMI 25.32 kg/m  Gen:   Awake, no distress   Resp:  Normal effort  ABD:  Gen tenderness without rebound or gaurding MSK:   Right upper extremity contracture, right upper extremity, right lower hemiparesis Other:    Medical Decision Making  Medically screening exam initiated at 10:37 PM.  Appropriate orders placed.  Davisha L Cashman was informed that the remainder of the evaluation will be completed by another provider, this initial triage assessment does not replace that evaluation, and the importance of remaining in the ED until their evaluation is complete.  Abdominal pain, emesis   Alarik Radu A, PA-C 10/16/21 2239    Noemi Chapel, MD 10/17/21 1126

## 2021-10-17 DIAGNOSIS — R1084 Generalized abdominal pain: Secondary | ICD-10-CM | POA: Diagnosis not present

## 2021-10-17 LAB — URINALYSIS, ROUTINE W REFLEX MICROSCOPIC
Bacteria, UA: NONE SEEN
Bilirubin Urine: NEGATIVE
Glucose, UA: NEGATIVE mg/dL
Ketones, ur: NEGATIVE mg/dL
Leukocytes,Ua: NEGATIVE
Nitrite: NEGATIVE
Protein, ur: NEGATIVE mg/dL
Specific Gravity, Urine: 1.016 (ref 1.005–1.030)
pH: 7 (ref 5.0–8.0)

## 2021-10-17 LAB — TROPONIN I (HIGH SENSITIVITY)
Troponin I (High Sensitivity): 2 ng/L (ref <18)
Troponin I (High Sensitivity): 3 ng/L (ref <18)

## 2021-10-17 MED ORDER — PIPERACILLIN-TAZOBACTAM 3.375 G IVPB 30 MIN
3.3750 g | Freq: Once | INTRAVENOUS | Status: AC
  Administered 2021-10-17: 3.375 g via INTRAVENOUS
  Filled 2021-10-17: qty 50

## 2021-10-17 MED ORDER — IOHEXOL 300 MG/ML  SOLN
100.0000 mL | Freq: Once | INTRAMUSCULAR | Status: AC | PRN
  Administered 2021-10-17: 100 mL via INTRAVENOUS

## 2021-10-17 MED ORDER — AMOXICILLIN-POT CLAVULANATE 875-125 MG PO TABS: 1.0000 | ORAL_TABLET | Freq: Two times a day (BID) | ORAL | 0 refills | Status: DC

## 2021-10-17 MED ORDER — LACTATED RINGERS IV BOLUS
1000.0000 mL | Freq: Once | INTRAVENOUS | Status: AC
  Administered 2021-10-17: 1000 mL via INTRAVENOUS

## 2021-10-17 NOTE — ED Notes (Signed)
Patient transported to CT 

## 2021-10-17 NOTE — ED Notes (Addendum)
Pt says she does not walk at home- Dr Wyvonnia Dusky made aware

## 2021-10-17 NOTE — ED Notes (Signed)
Called pt friend, listed on contacts, Herbie [place] who says he will be up here shortly to pick up pt. Informed robert that he needs to come inside and let registration know that he is here to get pt.

## 2021-10-17 NOTE — Discharge Instructions (Signed)
Take antibiotic as prescribed for diverticulitis.  Keep yourself hydrated.  Follow-up with your doctor.  Return to the ED for worsening pain, fever, vomiting, not able to eat or drink or any other concerns

## 2021-10-20 ENCOUNTER — Ambulatory Visit: Payer: Medicare Other | Admitting: Gastroenterology

## 2021-10-22 NOTE — Telephone Encounter (Signed)
Please find out if patient is still taking Linzess 290 mcg.  Note on refill request stating medication was discontinued on 8/17 at the time of hospital discharge.

## 2021-10-22 NOTE — Telephone Encounter (Signed)
LMOM for pt to call office back 

## 2021-10-23 NOTE — Telephone Encounter (Signed)
Spoke to pt, she stated she is still taking the Linzess 290 mcg.

## 2021-10-26 NOTE — Telephone Encounter (Signed)
Noted  

## 2021-10-26 NOTE — Telephone Encounter (Signed)
Rx sent 

## 2021-10-30 ENCOUNTER — Other Ambulatory Visit: Payer: Self-pay

## 2021-10-30 ENCOUNTER — Encounter (HOSPITAL_COMMUNITY): Payer: Self-pay

## 2021-10-30 ENCOUNTER — Emergency Department (HOSPITAL_COMMUNITY)
Admission: EM | Admit: 2021-10-30 | Discharge: 2021-10-31 | Disposition: A | Discharge status: Home / Self Care | Payer: Medicare Other | Attending: Emergency Medicine | Admitting: Emergency Medicine

## 2021-10-30 DIAGNOSIS — F1020 Alcohol dependence, uncomplicated: Secondary | ICD-10-CM

## 2021-10-30 DIAGNOSIS — D649 Anemia, unspecified: Secondary | ICD-10-CM

## 2021-10-30 DIAGNOSIS — D696 Thrombocytopenia, unspecified: Secondary | ICD-10-CM | POA: Diagnosis not present

## 2021-10-30 DIAGNOSIS — R1084 Generalized abdominal pain: Secondary | ICD-10-CM | POA: Diagnosis not present

## 2021-10-30 DIAGNOSIS — D6959 Other secondary thrombocytopenia: Secondary | ICD-10-CM

## 2021-10-30 DIAGNOSIS — E876 Hypokalemia: Secondary | ICD-10-CM | POA: Diagnosis not present

## 2021-10-30 DIAGNOSIS — Y907 Blood alcohol level of 200-239 mg/100 ml: Secondary | ICD-10-CM | POA: Insufficient documentation

## 2021-10-30 DIAGNOSIS — Z79899 Other long term (current) drug therapy: Secondary | ICD-10-CM | POA: Diagnosis not present

## 2021-10-30 DIAGNOSIS — R109 Unspecified abdominal pain: Secondary | ICD-10-CM | POA: Diagnosis present

## 2021-10-30 DIAGNOSIS — F10920 Alcohol use, unspecified with intoxication, uncomplicated: Secondary | ICD-10-CM

## 2021-10-30 MED ORDER — HYDROMORPHONE HCL 1 MG/ML IJ SOLN
0.5000 mg | Freq: Once | INTRAMUSCULAR | Status: AC
  Administered 2021-10-31: 0.5 mg via INTRAVENOUS
  Filled 2021-10-30: qty 1

## 2021-10-30 NOTE — ED Provider Notes (Signed)
[facility] EMERGENCY DEPARTMENT Provider Note   CSN: [id number] Arrival date & time: 10/30/21  2258     History  Chief Complaint  Patient presents with   Abdominal Pain    ETOH on board    [name] is a 66 y.o. female.  Patient c/o abd pain in the past day. Symptoms acute onset, moderate, constant, dull, mid abd, non radiating, without specific exacerbating or alleviating factors. No nvd. No constipation. No abd distension. No back or flank pain. No fever or chills. No dysuria or hematuria. No vaginal discharge or bleeding.   The history is provided by the patient, medical records and the EMS personnel. The history is limited by the condition of the patient.  Abdominal Pain Associated symptoms: no chest pain, no chills, no constipation, no diarrhea, no dysuria, no fever, no shortness of breath, no sore throat and no vomiting       Home Medications Prior to Admission medications   Medication Sig Start Date End Date Taking? Authorizing Provider  albuterol (PROVENTIL HFA;VENTOLIN HFA) 108 (90 BASE) MCG/ACT inhaler Inhale 2 puffs into the lungs every 6 (six) hours as needed for wheezing or shortness of breath.     [provider]  amoxicillin-clavulanate (AUGMENTIN) 875-125 MG tablet Take 1 tablet by mouth every 12 (twelve) hours. 10/17/21   Rancour, Annie Main, MD  ENSURE PLUS (ENSURE PLUS) LIQD Take 237 mLs by mouth 3 (three) times daily between meals.    [provider]  folic acid (FOLVITE) 973 MCG tablet Take 400 mcg by mouth daily.    [provider]  HYDROcodone-acetaminophen (NORCO/VICODIN) 5-325 MG tablet One tablet every six hours for pain.  Limit 7 days. 09/29/21   Sanjuana Kava, MD  LINZESS 290 MCG CAPS capsule TAKE (1) CAPSULE BY MOUTH EVERY DAY. 10/26/21   Erenest Rasher, PA-C  lisinopril (ZESTRIL) 40 MG tablet Take 1 tablet (40 mg total) by mouth daily. 10/09/19   Roxan Hockey, MD  loperamide (IMODIUM) 2 MG capsule Take 1 capsule  (2 mg total) by mouth 3 (three) times daily as needed for diarrhea or loose stools. 06/03/21   Kathie Dike, MD  pantoprazole (PROTONIX) 40 MG tablet Take 1 tablet (40 mg total) by mouth 2 (two) times daily before a meal. 11/05/19   Gosrani, Nimish C, MD  PHENobarbital (LUMINAL) 32.4 MG tablet Take 32.4 mg by mouth 2 (two) times daily. 07/13/19   [provider]  phenytoin (DILANTIN) 100 MG ER capsule Take 100 mg by mouth 3 (three) times daily.    [provider]  potassium chloride SA (KLOR-CON) 20 MEQ tablet Take 20 mEq by mouth 3 (three) times daily. 08/25/20   [provider]  solifenacin (VESICARE) 5 MG tablet Take 5 mg by mouth daily. 03/11/20   [provider]      Allergies    Patient has no known allergies.    Review of Systems   Review of Systems  Constitutional:  Negative for chills and fever.  HENT:  Negative for sore throat.   Eyes:  Negative for pain.  Respiratory:  Negative for shortness of breath.   Cardiovascular:  Negative for chest pain.  Gastrointestinal:  Positive for abdominal pain. Negative for constipation, diarrhea and vomiting.  Genitourinary:  Negative for dysuria and flank pain.  Musculoskeletal:  Negative for back pain and neck pain.  Skin:  Negative for rash.  Neurological:  Negative for headaches.  Hematological:  Does not bruise/bleed easily.  Psychiatric/Behavioral:  Negative  for agitation.    Physical Exam Updated Vital Signs BP 135/67 (BP Location: Left Arm)    Pulse 86    Temp 97.7 F (36.5 C) (Oral)    Resp 16    Ht 1.626 m (5\' 4" )    Wt 66.9 kg    SpO2 100%    BMI 25.32 kg/m  Physical Exam Vitals and nursing note reviewed.  Constitutional:      Appearance: Normal appearance. She is well-developed.  HENT:     Head: Atraumatic.     Nose: Nose normal.     Mouth/Throat:     Mouth: Mucous membranes are moist.  Eyes:     General: No scleral icterus.    Conjunctiva/sclera: Conjunctivae normal.  Neck:      Trachea: No tracheal deviation.  Cardiovascular:     Rate and Rhythm: Normal rate and regular rhythm.     Pulses: Normal pulses.     Heart sounds: Normal heart sounds. No murmur heard.   No friction rub. No gallop.  Pulmonary:     Effort: Pulmonary effort is normal. No respiratory distress.     Breath sounds: Normal breath sounds.  Abdominal:     General: Bowel sounds are normal. There is no distension.     Palpations: Abdomen is soft. There is no mass.     Tenderness: There is no abdominal tenderness. There is no guarding or rebound.     Hernia: No hernia is present.  Genitourinary:    Comments: No cva tenderness.  Musculoskeletal:        General: No swelling.     Cervical back: Normal range of motion and neck supple. No rigidity. No muscular tenderness.  Skin:    General: Skin is warm and dry.     Findings: No rash.  Neurological:     Mental Status: She is alert.     Comments: Alert, speech normal.   Psychiatric:        Mood and Affect: Mood normal.    ED Results / Procedures / Treatments   Labs (all labs ordered are listed, but only abnormal results are displayed) Results for orders placed or performed during the hospital encounter of 10/30/21  CBC  Result Value Ref Range   WBC 4.1 4.0 - 10.5 K/uL   RBC 3.49 (L) 3.87 - 5.11 MIL/uL   Hemoglobin 9.8 (L) 12.0 - 15.0 g/dL   HCT 30.9 (L) 36.0 - 46.0 %   MCV 88.5 80.0 - 100.0 fL   MCH 28.1 26.0 - 34.0 pg   MCHC 31.7 30.0 - 36.0 g/dL   RDW 14.9 11.5 - 15.5 %   Platelets 138 (L) 150 - 400 K/uL   nRBC 0.0 0.0 - 0.2 %  Comprehensive metabolic panel  Result Value Ref Range   Sodium 132 (L) 135 - 145 mmol/L   Potassium 2.8 (L) 3.5 - 5.1 mmol/L   Chloride 101 98 - 111 mmol/L   CO2 21 (L) 22 - 32 mmol/L   Glucose, Bld 104 (H) 70 - 99 mg/dL   BUN <5 (L) 8 - 23 mg/dL   Creatinine, Ser 0.60 0.44 - 1.00 mg/dL   Calcium 8.3 (L) 8.9 - 10.3 mg/dL   Total Protein 7.8 6.5 - 8.1 g/dL   Albumin 3.3 (L) 3.5 - 5.0 g/dL   AST 50 (H)  15 - 41 U/L   ALT 18 0 - 44 U/L   Alkaline Phosphatase 121 38 - 126 U/L   Total Bilirubin 0.3  0.3 - 1.2 mg/dL   GFR, Estimated >60 >60 mL/min   Anion gap 10 5 - 15  Lipase, blood  Result Value Ref Range   Lipase 27 11 - 51 U/L  Urinalysis, Routine w reflex microscopic Urine, Clean Catch  Result Value Ref Range   Color, Urine YELLOW YELLOW   APPearance CLEAR CLEAR   Specific Gravity, Urine <1.005 (L) 1.005 - 1.030   pH 6.5 5.0 - 8.0   Glucose, UA NEGATIVE NEGATIVE mg/dL   Hgb urine dipstick NEGATIVE NEGATIVE   Bilirubin Urine NEGATIVE NEGATIVE   Ketones, ur NEGATIVE NEGATIVE mg/dL   Protein, ur NEGATIVE NEGATIVE mg/dL   Nitrite NEGATIVE NEGATIVE   Leukocytes,Ua NEGATIVE NEGATIVE  Ethanol  Result Value Ref Range   Alcohol, Ethyl (B) 215 (H) <10 mg/dL  Magnesium  Result Value Ref Range   Magnesium 1.6 (L) 1.7 - 2.4 mg/dL   CT Head Wo Contrast  Result Date: 10/17/2021 CLINICAL DATA:  Altered mental status and abdominal pain. EXAM: CT HEAD WITHOUT CONTRAST TECHNIQUE: Contiguous axial images were obtained from the base of the skull through the vertex without intravenous contrast. COMPARISON:  December 18, 2020 FINDINGS: Brain: There is mild cerebral atrophy with widening of the extra-axial spaces and ventricular dilatation. There are areas of decreased attenuation within the white matter tracts of the supratentorial brain, consistent with microvascular disease changes. Bilateral frontal lobe and left temporal lobe encephalomalacia is again noted, with ex vacuo dilatation of the left lateral ventricle. 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 Vascular: No hyperdense vessel or unexpected calcification. Skull: A right frontal craniotomy defect is again seen. Sinuses/Orbits: No acute finding. Other: None. IMPRESSION: 1. No acute intracranial abnormality. 2. Generalized cerebral atrophy with stable areas of bilateral frontal and right temporal encephalomalacia. 3.  Prior aneurysm clipping. Electronically Signed   By: Virgina Norfolk M.D.   On: 10/17/2021 02:41   CT ABDOMEN PELVIS W CONTRAST  Result Date: 10/17/2021 CLINICAL DATA:  Abdominal pain. EXAM: CT ABDOMEN AND PELVIS WITH CONTRAST TECHNIQUE: Multidetector CT imaging of the abdomen and pelvis was performed using the standard protocol following bolus administration of intravenous contrast. CONTRAST:  171mL OMNIPAQUE IOHEXOL 300 MG/ML  SOLN COMPARISON:  May 28, 2021 FINDINGS: Lower chest: Very mild atelectasis is seen within the bilateral lung bases. Hepatobiliary: The liver is cirrhotic in appearance. No focal liver abnormality is seen. No gallstones, gallbladder wall thickening, or biliary dilatation. Pancreas: Unremarkable. No pancreatic ductal dilatation or surrounding inflammatory changes. Spleen: Normal in size without focal abnormality. Adrenals/Urinary Tract: Adrenal glands are unremarkable. Kidneys are normal in size, without focal lesions. Stable bilateral extrarenal pelvis prominence is noted. The urinary bladder is markedly distended. Stomach/Bowel: There is a small hiatal hernia. Appendix appears normal. No evidence of bowel dilatation. Mildly inflamed diverticula are seen within the mid descending colon with noninflamed diverticula noted throughout the remainder of the large bowel. There is no evidence of associated perforation or abscess. Vascular/Lymphatic: No significant vascular findings are present. No enlarged abdominal or pelvic lymph nodes. Reproductive: Status post hysterectomy. No adnexal masses. Other: No abdominal wall hernia or abnormality. No abdominopelvic ascites. Musculoskeletal: Degenerative changes are seen throughout the lumbar spine. IMPRESSION: 1. Mild uncomplicated diverticulitis involving the mid descending colon. 2. Small hiatal hernia. 3. Cirrhotic liver. 4. Markedly distended urinary bladder. Electronically Signed   By: Virgina Norfolk M.D.   On: 10/17/2021 02:46     EKG EKG Interpretation  Date/Time:  Saturday October 31 2021 00:48:18 EST Ventricular Rate:  85 PR Interval:  171 QRS Duration: 97 QT Interval:  419 QTC Calculation: 499 R Axis:   48 Text Interpretation: Sinus rhythm Borderline T wave abnormalities Borderline prolonged QT interval Confirmed by Lajean Saver [phone]) on 10/31/2021 12:50:21 AM  Radiology No results found.  Procedures Procedures    Medications Ordered in ED Medications - No data to display  ED Course/ Medical Decision Making/ A&P                           Medical Decision Making Problems Addressed: Abdominal pain, acute, generalized: acute illness or injury with systemic symptoms Acute alcoholic intoxication without complication [facility]): acute illness or injury with systemic symptoms that poses a threat to life or bodily functions Chronic alcoholism ([place]): chronic illness or injury with exacerbation, progression, or side effects of treatment that poses a threat to life or bodily functions Chronic anemia: chronic illness or injury Hypokalemia: acute illness or injury that poses a threat to life or bodily functions Hypomagnesemia: acute illness or injury 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 Labs: ordered. Decision-making details documented in ED Course. ECG/medicine tests: ordered and independent interpretation performed. Decision-making details documented in ED Course.  Risk Parenteral controlled substances. Drug therapy requiring intensive monitoring for toxicity. Decision regarding hospitalization. Diagnosis or treatment significantly limited by social determinants of health.   Iv ns bolus. Stat labs.   Reviewed nursing notes and prior charts for additional history. External  charts reviewed. Prior ct imaging reviewed.   Zofran iv. Dilaudid iv.   Labs reviewed/interpreted by me - k low. Kcl iv. Ecg. Mg added to labs.   Additional labs reviewed/interpreted by me - etoh high, 215. Hx etoh abuse. Discussed etoh cessation, counseled on etoh abuse, neg impact on health, etc. - discussed outpatient tx program - will also provide resource guide. Spent 30 minutes discussing.   Additional labs reviewed/interpreted by me - mg low, mg iv.   Po fluids/food provided. Ambulates in hall - steady gait.   Rec close pcp f/u.  Rx provided for home.   Return precautions provided and discussed.           Final Clinical Impression(s) / ED Diagnoses Final diagnoses:  None    Rx / DC Orders ED Discharge Orders     None         Lajean Saver, MD 10/31/21 [phone]

## 2021-10-30 NOTE — Discharge Instructions (Addendum)
It was our pleasure to provide your ER care today - we hope that you feel better.  Drink plenty of fluids/stay well hydrated. Avoid alcohol use. Take protonix (acid blocker medication).  From today's labs, your potassium level is low - eat plenty of fruits and vegetables, take potassium supplement as prescribed, and follow up with primary care doctor in one week.   Return to ER if worse, new symptoms, fevers, worsening or severe pain, persistent vomiting, or other concern.

## 2021-10-30 NOTE — ED Triage Notes (Signed)
Pt from home via [facility] for abd pain- pt has hx of ETOH abuse and cirrhosis. Pt was ambulatory to stretcher per EMS. EMS reports all vitals WNL.  Pt smells of ETOH and reports drinking beer.

## 2021-10-31 DIAGNOSIS — R1084 Generalized abdominal pain: Secondary | ICD-10-CM | POA: Diagnosis not present

## 2021-10-31 LAB — COMPREHENSIVE METABOLIC PANEL
ALT: 18 U/L (ref 0–44)
AST: 50 U/L — ABNORMAL HIGH (ref 15–41)
Albumin: 3.3 g/dL — ABNORMAL LOW (ref 3.5–5.0)
Alkaline Phosphatase: 121 U/L (ref 38–126)
Anion gap: 10 (ref 5–15)
BUN: <5 mg/dL — ABNORMAL LOW (ref 8–23)
CO2: 21 mmol/L — ABNORMAL LOW (ref 22–32)
Calcium: 8.3 mg/dL — ABNORMAL LOW (ref 8.9–10.3)
Chloride: 101 mmol/L (ref 98–111)
Creatinine, Ser: 0.6 mg/dL (ref 0.44–1.00)
GFR, Estimated: >60 mL/min (ref >60)
Glucose, Bld: 104 mg/dL — ABNORMAL HIGH (ref 70–99)
Potassium: 2.8 mmol/L — ABNORMAL LOW (ref 3.5–5.1)
Sodium: 132 mmol/L — ABNORMAL LOW (ref 135–145)
Total Bilirubin: 0.3 mg/dL (ref 0.3–1.2)
Total Protein: 7.8 g/dL (ref 6.5–8.1)

## 2021-10-31 LAB — URINALYSIS, ROUTINE W REFLEX MICROSCOPIC
Bilirubin Urine: NEGATIVE
Glucose, UA: NEGATIVE mg/dL
Hgb urine dipstick: NEGATIVE
Ketones, ur: NEGATIVE mg/dL
Leukocytes,Ua: NEGATIVE
Nitrite: NEGATIVE
Protein, ur: NEGATIVE mg/dL
Specific Gravity, Urine: <1.005 — ABNORMAL LOW (ref 1.005–1.030)
pH: 6.5 (ref 5.0–8.0)

## 2021-10-31 LAB — CBC
HCT: 30.9 % — ABNORMAL LOW (ref 36.0–46.0)
Hemoglobin: 9.8 g/dL — ABNORMAL LOW (ref 12.0–15.0)
MCH: 28.1 pg (ref 26.0–34.0)
MCHC: 31.7 g/dL (ref 30.0–36.0)
MCV: 88.5 fL (ref 80.0–100.0)
Platelets: 138 10*3/uL — ABNORMAL LOW (ref 150–400)
RBC: 3.49 MIL/uL — ABNORMAL LOW (ref 3.87–5.11)
RDW: 14.9 % (ref 11.5–15.5)
WBC: 4.1 10*3/uL (ref 4.0–10.5)
nRBC: 0 % (ref 0.0–0.2)

## 2021-10-31 LAB — MAGNESIUM: Magnesium: 1.6 mg/dL — ABNORMAL LOW (ref 1.7–2.4)

## 2021-10-31 LAB — ETHANOL: Alcohol, Ethyl (B): 215 mg/dL — ABNORMAL HIGH (ref <10)

## 2021-10-31 LAB — LIPASE, BLOOD: Lipase: 27 U/L (ref 11–51)

## 2021-10-31 MED ORDER — POTASSIUM CHLORIDE CRYS ER 20 MEQ PO TBCR: EXTENDED_RELEASE_TABLET | ORAL | 0 refills | Status: DC

## 2021-10-31 MED ORDER — FAMOTIDINE 20 MG PO TABS
20.0000 mg | ORAL_TABLET | Freq: Once | ORAL | Status: AC
Start: 2021-10-31 — End: 2021-10-31
  Administered 2021-10-31: 20 mg via ORAL
  Filled 2021-10-31: qty 1

## 2021-10-31 MED ORDER — ALUM & MAG HYDROXIDE-SIMETH 200-200-20 MG/5ML PO SUSP
30.0000 mL | Freq: Once | ORAL | Status: AC
  Administered 2021-10-31: 30 mL via ORAL
  Filled 2021-10-31: qty 30

## 2021-10-31 MED ORDER — MAGNESIUM SULFATE 2 GM/50ML IV SOLN
2.0000 g | Freq: Once | INTRAVENOUS | Status: AC
  Administered 2021-10-31: 2 g via INTRAVENOUS
  Filled 2021-10-31: qty 50

## 2021-10-31 MED ORDER — POTASSIUM CHLORIDE 10 MEQ/100ML IV SOLN
10.0000 meq | INTRAVENOUS | Status: AC
  Administered 2021-10-31 (×3): 10 meq via INTRAVENOUS
  Filled 2021-10-31 (×3): qty 100

## 2021-10-31 MED ORDER — POTASSIUM CHLORIDE CRYS ER 20 MEQ PO TBCR
40.0000 meq | EXTENDED_RELEASE_TABLET | Freq: Once | ORAL | Status: AC
  Administered 2021-10-31: 40 meq via ORAL
  Filled 2021-10-31: qty 2

## 2021-10-31 MED ORDER — ACETAMINOPHEN 325 MG PO TABS
650.0000 mg | ORAL_TABLET | Freq: Once | ORAL | Status: AC
  Administered 2021-10-31: 650 mg via ORAL
  Filled 2021-10-31: qty 2

## 2021-10-31 NOTE — ED Notes (Signed)
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

## 2021-10-31 NOTE — ED Notes (Signed)
[name] contacted and will have someone come and pick up pt in a bout a hour.

## 2021-11-18 ENCOUNTER — Encounter (HOSPITAL_COMMUNITY): Payer: Self-pay

## 2021-11-18 ENCOUNTER — Other Ambulatory Visit: Payer: Self-pay

## 2021-11-18 ENCOUNTER — Inpatient Hospital Stay (HOSPITAL_COMMUNITY)
Admission: EM | Admit: 2021-11-18 | Discharge: 2021-11-20 | DRG: 100 | Disposition: A | Discharge status: Home / Self Care | Payer: 59 | Attending: Internal Medicine | Admitting: Internal Medicine

## 2021-11-18 DIAGNOSIS — K219 Gastro-esophageal reflux disease without esophagitis: Secondary | ICD-10-CM | POA: Diagnosis present

## 2021-11-18 DIAGNOSIS — R1084 Generalized abdominal pain: Secondary | ICD-10-CM

## 2021-11-18 DIAGNOSIS — G4730 Sleep apnea, unspecified: Secondary | ICD-10-CM | POA: Diagnosis present

## 2021-11-18 DIAGNOSIS — R7889 Finding of other specified substances, not normally found in blood: Secondary | ICD-10-CM

## 2021-11-18 DIAGNOSIS — Z9114 Patient's other noncompliance with medication regimen: Secondary | ICD-10-CM

## 2021-11-18 DIAGNOSIS — D696 Thrombocytopenia, unspecified: Secondary | ICD-10-CM | POA: Diagnosis present

## 2021-11-18 DIAGNOSIS — Z20822 Contact with and (suspected) exposure to covid-19: Secondary | ICD-10-CM | POA: Diagnosis present

## 2021-11-18 DIAGNOSIS — I10 Essential (primary) hypertension: Secondary | ICD-10-CM | POA: Diagnosis present

## 2021-11-18 DIAGNOSIS — F10239 Alcohol dependence with withdrawal, unspecified: Secondary | ICD-10-CM | POA: Diagnosis present

## 2021-11-18 DIAGNOSIS — E871 Hypo-osmolality and hyponatremia: Secondary | ICD-10-CM | POA: Diagnosis present

## 2021-11-18 DIAGNOSIS — Z8673 Personal history of transient ischemic attack (TIA), and cerebral infarction without residual deficits: Secondary | ICD-10-CM

## 2021-11-18 DIAGNOSIS — G928 Other toxic encephalopathy: Secondary | ICD-10-CM | POA: Diagnosis present

## 2021-11-18 DIAGNOSIS — Z8711 Personal history of peptic ulcer disease: Secondary | ICD-10-CM

## 2021-11-18 DIAGNOSIS — E876 Hypokalemia: Secondary | ICD-10-CM | POA: Diagnosis present

## 2021-11-18 DIAGNOSIS — Z79899 Other long term (current) drug therapy: Secondary | ICD-10-CM

## 2021-11-18 DIAGNOSIS — R569 Unspecified convulsions: Principal | ICD-10-CM

## 2021-11-18 DIAGNOSIS — Y908 Blood alcohol level of 240 mg/100 ml or more: Secondary | ICD-10-CM | POA: Diagnosis present

## 2021-11-18 DIAGNOSIS — I69322 Dysarthria following cerebral infarction: Secondary | ICD-10-CM

## 2021-11-18 DIAGNOSIS — Z87891 Personal history of nicotine dependence: Secondary | ICD-10-CM

## 2021-11-18 DIAGNOSIS — Z90710 Acquired absence of both cervix and uterus: Secondary | ICD-10-CM

## 2021-11-18 DIAGNOSIS — K2970 Gastritis, unspecified, without bleeding: Secondary | ICD-10-CM | POA: Diagnosis present

## 2021-11-18 DIAGNOSIS — R109 Unspecified abdominal pain: Secondary | ICD-10-CM | POA: Diagnosis present

## 2021-11-18 DIAGNOSIS — F1010 Alcohol abuse, uncomplicated: Secondary | ICD-10-CM | POA: Diagnosis present

## 2021-11-18 DIAGNOSIS — I69351 Hemiplegia and hemiparesis following cerebral infarction affecting right dominant side: Secondary | ICD-10-CM

## 2021-11-18 DIAGNOSIS — F10129 Alcohol abuse with intoxication, unspecified: Secondary | ICD-10-CM | POA: Diagnosis present

## 2021-11-18 LAB — CBC WITH DIFFERENTIAL/PLATELET
Abs Immature Granulocytes: 0.01 10*3/uL (ref 0.00–0.07)
Basophils Absolute: 0 10*3/uL (ref 0.0–0.1)
Basophils Relative: 0 %
Eosinophils Absolute: 0.1 10*3/uL (ref 0.0–0.5)
Eosinophils Relative: 1 %
HCT: 32.4 % — ABNORMAL LOW (ref 36.0–46.0)
Hemoglobin: 10.3 g/dL — ABNORMAL LOW (ref 12.0–15.0)
Immature Granulocytes: 0 %
Lymphocytes Relative: 35 %
Lymphs Abs: 1.9 10*3/uL (ref 0.7–4.0)
MCH: 26.6 pg (ref 26.0–34.0)
MCHC: 31.8 g/dL (ref 30.0–36.0)
MCV: 83.7 fL (ref 80.0–100.0)
Monocytes Absolute: 0.7 10*3/uL (ref 0.1–1.0)
Monocytes Relative: 12 %
Neutro Abs: 2.8 10*3/uL (ref 1.7–7.7)
Neutrophils Relative %: 52 %
Platelets: 152 10*3/uL (ref 150–400)
RBC: 3.87 MIL/uL (ref 3.87–5.11)
RDW: 14.8 % (ref 11.5–15.5)
WBC: 5.5 10*3/uL (ref 4.0–10.5)
nRBC: 0 % (ref 0.0–0.2)

## 2021-11-18 LAB — LIPASE, BLOOD: Lipase: 35 U/L (ref 11–51)

## 2021-11-18 LAB — BASIC METABOLIC PANEL
Anion gap: 15 (ref 5–15)
BUN: 7 mg/dL — ABNORMAL LOW (ref 8–23)
CO2: 25 mmol/L (ref 22–32)
Calcium: 8.2 mg/dL — ABNORMAL LOW (ref 8.9–10.3)
Chloride: 85 mmol/L — ABNORMAL LOW (ref 98–111)
Creatinine, Ser: 0.58 mg/dL (ref 0.44–1.00)
GFR, Estimated: >60 mL/min (ref >60)
Glucose, Bld: 99 mg/dL (ref 70–99)
Potassium: 2.1 mmol/L — CL (ref 3.5–5.1)
Sodium: 125 mmol/L — ABNORMAL LOW (ref 135–145)

## 2021-11-18 LAB — PHENOBARBITAL LEVEL: Phenobarbital: 9.8 ug/mL — ABNORMAL LOW (ref 15.0–30.0)

## 2021-11-18 LAB — PHENYTOIN LEVEL, TOTAL: Phenytoin Lvl: 6.1 ug/mL — ABNORMAL LOW (ref 10.0–20.0)

## 2021-11-18 NOTE — ED Triage Notes (Signed)
Patient via EMS for seizure like activity at home. Unknown how long the event lasted. Does have history of seizures.

## 2021-11-18 NOTE — ED Provider Notes (Signed)
[facility] EMERGENCY DEPARTMENT Provider Note   CSN: [id number] Arrival date & time: 11/18/21  1826     History  Chief Complaint  Patient presents with   Seizures    [name] is a 66 y.o. female.  HPI   She was recently in the ED on 10/30/2021 at that time she was evaluated for abdominal pain; and had surveillance of chronic illnesses including alcoholism, and anemia.  She was found to be intoxicated with alcohol, and having thrombocytopenia, hypokalemia and hypomagnesemia.  Patient states she is here today because of abdominal pain.    Home Medications Prior to Admission medications   Medication Sig Start Date End Date Taking? Authorizing Provider  albuterol (PROVENTIL HFA;VENTOLIN HFA) 108 (90 BASE) MCG/ACT inhaler Inhale 2 puffs into the lungs every 6 (six) hours as needed for wheezing or shortness of breath.    Yes [provider]  ENSURE PLUS (ENSURE PLUS) LIQD Take 237 mLs by mouth 3 (three) times daily between meals.   Yes [provider]  LINZESS 290 MCG CAPS capsule TAKE (1) CAPSULE BY MOUTH EVERY DAY. 10/26/21  Yes Aliene Altes S, PA-C  loperamide (IMODIUM) 2 MG capsule Take 1 capsule (2 mg total) by mouth 3 (three) times daily as needed for diarrhea or loose stools. 06/03/21  Yes Kathie Dike, MD  PHENobarbital (LUMINAL) 32.4 MG tablet Take 32.4 mg by mouth 2 (two) times daily. 07/13/19  Yes [provider]  solifenacin (VESICARE) 5 MG tablet Take 5 mg by mouth daily. 03/11/20  Yes [provider]  amoxicillin-clavulanate (AUGMENTIN) 875-125 MG tablet Take 1 tablet by mouth every 12 (twelve) hours. 10/17/21   Rancour, Annie Main, MD  folic acid (FOLVITE) 627 MCG tablet Take 400 mcg by mouth daily.    [provider]  HYDROcodone-acetaminophen (NORCO/VICODIN) 5-325 MG tablet One tablet every six hours for pain.  Limit 7 days. Patient not taking: Reported on 11/18/2021 09/29/21   Sanjuana Kava, MD  lisinopril (ZESTRIL) 40  MG tablet Take 1 tablet (40 mg total) by mouth daily. Patient not taking: Reported on 11/18/2021 10/09/19   Roxan Hockey, MD  pantoprazole (PROTONIX) 40 MG tablet Take 1 tablet (40 mg total) by mouth 2 (two) times daily before a meal. Patient not taking: Reported on 11/18/2021 11/05/19   Doree Albee, MD  phenytoin (DILANTIN) 100 MG ER capsule Take 100 mg by mouth 3 (three) times daily.    [provider]  potassium chloride SA (KLOR-CON M) 20 MEQ tablet One po bid x 3 days, then one po once a day Patient not taking: Reported on 11/18/2021 10/31/21   Lajean Saver, MD      Allergies    Patient has no known allergies.    Review of Systems   Review of Systems  Physical Exam Updated Vital Signs BP (!) 164/97    Pulse 88    Resp 20    Ht 5\' 4"  (1.626 m)    Wt 66.7 kg    SpO2 100%    BMI 25.23 kg/m  Physical Exam Vitals and nursing note reviewed.  Constitutional:      General: She is in acute distress (Uncomfortable).     Appearance: She is well-developed. She is not ill-appearing, toxic-appearing or diaphoretic.     Comments: Under nourished appearance  HENT:     Head: Normocephalic and atraumatic.     Right Ear: External ear normal.     Left Ear: External ear normal.  Eyes:  Conjunctiva/sclera: Conjunctivae normal.     Pupils: Pupils are equal, round, and reactive to light.  Neck:     Trachea: Phonation normal.  Cardiovascular:     Rate and Rhythm: Normal rate.  Pulmonary:     Effort: Pulmonary effort is normal.     Breath sounds: Normal breath sounds.  Abdominal:     General: There is no distension.     Palpations: Abdomen is soft.     Tenderness: There is abdominal tenderness.  Musculoskeletal:        General: Normal range of motion.     Cervical back: Normal range of motion and neck supple.  Skin:    General: Skin is warm and dry.  Neurological:     Mental Status: She is alert.     Cranial Nerves: No cranial nerve deficit.     Sensory: No sensory  deficit.     Motor: No abnormal muscle tone.     Coordination: Coordination normal.  Psychiatric:        Mood and Affect: Mood normal.        Behavior: Behavior normal.    ED Results / Procedures / Treatments   Labs (all labs ordered are listed, but only abnormal results are displayed) Labs Reviewed  BASIC METABOLIC PANEL - Abnormal; Notable for the following components:      Result Value   Sodium 125 (*)    Potassium 2.1 (*)    Chloride 85 (*)    BUN 7 (*)    Calcium 8.2 (*)    All other components within normal limits  CBC WITH DIFFERENTIAL/PLATELET - Abnormal; Notable for the following components:   Hemoglobin 10.3 (*)    HCT 32.4 (*)    All other components within normal limits  PHENOBARBITAL LEVEL - Abnormal; Notable for the following components:   Phenobarbital 9.8 (*)    All other components within normal limits  PHENYTOIN LEVEL, TOTAL - Abnormal; Notable for the following components:   Phenytoin Lvl 6.1 (*)    All other components within normal limits  LIPASE, BLOOD  MAGNESIUM  ETHANOL    EKG None  Radiology No results found.  Procedures Procedures    Medications Ordered in ED Medications  magnesium sulfate IVPB 2 g 50 mL (has no administration in time range)  potassium chloride 10 mEq in 100 mL IVPB (has no administration in time range)  phenytoin (DILANTIN) 500 mg in sodium chloride 0.9 % 100 mL IVPB (has no administration in time range)    ED Course/ Medical Decision Making/ A&P                           Medical Decision Making Patient presenting for abdominal pain.  Presenting complaint by EMS was seizure activity at home.  Patient is unaware of any seizures.  She is a known alcoholic, and recently was evaluated for abdominal pain in the ED and treated for chronic symptoms.  Amount and/or Complexity of Data Reviewed Labs: ordered.    Details: CBC, metabolic panel, levels of phenytoin and phenobarbital, lipase, magnesium, alcohol levels-initial  findings are consistent with mild hyponatremia, very low potassium, low chloride, low calcium, low hemoglobin.  Phenytoin and phenobarbital levels are low/subtherapeutic.  No seizures in the ED or seizures by recent history. Discussion of management or test interpretation with external provider(s): Case discussed with hospitalist to arrange admission.  Risk Decision regarding hospitalization. Risk Details: Patient with significant and recurrent hypokalemia with  suspected hypomagnesemia.  Abdominal exam is benign.  Suspect complications of alcoholism causing metabolic abnormalities.  Consider alcoholic gastritis as cause of abdominal pain.  Reported seizures by EMS, not seen in the ED.  Her levels of Dilantin phenobarbital low.  Will consult hospitalist for admission, to treat and observe while stabilizing her.           Final Clinical Impression(s) / ED Diagnoses Final diagnoses:  Hypokalemia  Generalized abdominal pain  Subtherapeutic serum dilantin level    Rx / DC Orders ED Discharge Orders     None         Daleen Bo, MD 11/19/21 [phone]

## 2021-11-19 ENCOUNTER — Observation Stay (HOSPITAL_COMMUNITY)
Admit: 2021-11-19 | Discharge: 2021-11-19 | Disposition: A | Discharge status: Home / Self Care | Payer: 59 | Attending: Family Medicine | Admitting: Family Medicine

## 2021-11-19 DIAGNOSIS — I69322 Dysarthria following cerebral infarction: Secondary | ICD-10-CM | POA: Diagnosis not present

## 2021-11-19 DIAGNOSIS — Z90710 Acquired absence of both cervix and uterus: Secondary | ICD-10-CM | POA: Diagnosis not present

## 2021-11-19 DIAGNOSIS — R569 Unspecified convulsions: Principal | ICD-10-CM

## 2021-11-19 DIAGNOSIS — Z8673 Personal history of transient ischemic attack (TIA), and cerebral infarction without residual deficits: Secondary | ICD-10-CM

## 2021-11-19 DIAGNOSIS — F10129 Alcohol abuse with intoxication, unspecified: Secondary | ICD-10-CM | POA: Diagnosis present

## 2021-11-19 DIAGNOSIS — Z8711 Personal history of peptic ulcer disease: Secondary | ICD-10-CM | POA: Diagnosis not present

## 2021-11-19 DIAGNOSIS — F1010 Alcohol abuse, uncomplicated: Secondary | ICD-10-CM | POA: Diagnosis not present

## 2021-11-19 DIAGNOSIS — Z87891 Personal history of nicotine dependence: Secondary | ICD-10-CM | POA: Diagnosis not present

## 2021-11-19 DIAGNOSIS — E871 Hypo-osmolality and hyponatremia: Secondary | ICD-10-CM | POA: Diagnosis present

## 2021-11-19 DIAGNOSIS — I69351 Hemiplegia and hemiparesis following cerebral infarction affecting right dominant side: Secondary | ICD-10-CM | POA: Diagnosis not present

## 2021-11-19 DIAGNOSIS — R1033 Periumbilical pain: Secondary | ICD-10-CM

## 2021-11-19 DIAGNOSIS — Y908 Blood alcohol level of 240 mg/100 ml or more: Secondary | ICD-10-CM | POA: Diagnosis present

## 2021-11-19 DIAGNOSIS — K2970 Gastritis, unspecified, without bleeding: Secondary | ICD-10-CM | POA: Diagnosis present

## 2021-11-19 DIAGNOSIS — G928 Other toxic encephalopathy: Secondary | ICD-10-CM | POA: Diagnosis present

## 2021-11-19 DIAGNOSIS — D696 Thrombocytopenia, unspecified: Secondary | ICD-10-CM | POA: Diagnosis present

## 2021-11-19 DIAGNOSIS — F10239 Alcohol dependence with withdrawal, unspecified: Secondary | ICD-10-CM | POA: Diagnosis present

## 2021-11-19 DIAGNOSIS — Z20822 Contact with and (suspected) exposure to covid-19: Secondary | ICD-10-CM | POA: Diagnosis present

## 2021-11-19 DIAGNOSIS — G4730 Sleep apnea, unspecified: Secondary | ICD-10-CM | POA: Diagnosis present

## 2021-11-19 DIAGNOSIS — Z9114 Patient's other noncompliance with medication regimen: Secondary | ICD-10-CM | POA: Diagnosis not present

## 2021-11-19 DIAGNOSIS — E876 Hypokalemia: Secondary | ICD-10-CM | POA: Diagnosis present

## 2021-11-19 DIAGNOSIS — K219 Gastro-esophageal reflux disease without esophagitis: Secondary | ICD-10-CM | POA: Diagnosis present

## 2021-11-19 DIAGNOSIS — Z79899 Other long term (current) drug therapy: Secondary | ICD-10-CM | POA: Diagnosis not present

## 2021-11-19 DIAGNOSIS — I10 Essential (primary) hypertension: Secondary | ICD-10-CM | POA: Diagnosis present

## 2021-11-19 LAB — CBC WITH DIFFERENTIAL/PLATELET
Abs Immature Granulocytes: 0.01 10*3/uL (ref 0.00–0.07)
Basophils Absolute: 0 10*3/uL (ref 0.0–0.1)
Basophils Relative: 1 %
Eosinophils Absolute: 0.1 10*3/uL (ref 0.0–0.5)
Eosinophils Relative: 3 %
HCT: 34.4 % — ABNORMAL LOW (ref 36.0–46.0)
Hemoglobin: 11.2 g/dL — ABNORMAL LOW (ref 12.0–15.0)
Immature Granulocytes: 0 %
Lymphocytes Relative: 44 %
Lymphs Abs: 1.6 10*3/uL (ref 0.7–4.0)
MCH: 27.5 pg (ref 26.0–34.0)
MCHC: 32.6 g/dL (ref 30.0–36.0)
MCV: 84.5 fL (ref 80.0–100.0)
Monocytes Absolute: 0.4 10*3/uL (ref 0.1–1.0)
Monocytes Relative: 11 %
Neutro Abs: 1.5 10*3/uL — ABNORMAL LOW (ref 1.7–7.7)
Neutrophils Relative %: 41 %
Platelets: 165 10*3/uL (ref 150–400)
RBC: 4.07 MIL/uL (ref 3.87–5.11)
RDW: 14.8 % (ref 11.5–15.5)
WBC: 3.5 10*3/uL — ABNORMAL LOW (ref 4.0–10.5)
nRBC: 0 % (ref 0.0–0.2)

## 2021-11-19 LAB — MAGNESIUM
Magnesium: 1.7 mg/dL (ref 1.7–2.4)
Magnesium: 2.6 mg/dL — ABNORMAL HIGH (ref 1.7–2.4)

## 2021-11-19 LAB — COMPREHENSIVE METABOLIC PANEL
ALT: 16 U/L (ref 0–44)
AST: 36 U/L (ref 15–41)
Albumin: 3.5 g/dL (ref 3.5–5.0)
Alkaline Phosphatase: 117 U/L (ref 38–126)
Anion gap: 16 — ABNORMAL HIGH (ref 5–15)
BUN: 5 mg/dL — ABNORMAL LOW (ref 8–23)
CO2: 27 mmol/L (ref 22–32)
Calcium: 8.9 mg/dL (ref 8.9–10.3)
Chloride: 93 mmol/L — ABNORMAL LOW (ref 98–111)
Creatinine, Ser: 0.54 mg/dL (ref 0.44–1.00)
GFR, Estimated: >60 mL/min (ref >60)
Glucose, Bld: 94 mg/dL (ref 70–99)
Potassium: 2.7 mmol/L — CL (ref 3.5–5.1)
Sodium: 136 mmol/L (ref 135–145)
Total Bilirubin: 0.3 mg/dL (ref 0.3–1.2)
Total Protein: 7.9 g/dL (ref 6.5–8.1)

## 2021-11-19 LAB — RESP PANEL BY RT-PCR (FLU A&B, COVID) ARPGX2
Influenza A by PCR: NEGATIVE
Influenza B by PCR: NEGATIVE
SARS Coronavirus 2 by RT PCR: NEGATIVE

## 2021-11-19 LAB — ETHANOL: Alcohol, Ethyl (B): 203 mg/dL — ABNORMAL HIGH (ref <10)

## 2021-11-19 MED ORDER — FOLIC ACID 1 MG PO TABS
1.0000 mg | ORAL_TABLET | Freq: Every day | ORAL | Status: DC
  Administered 2021-11-19 – 2021-11-20 (×2): 1 mg via ORAL
  Filled 2021-11-19 (×2): qty 1

## 2021-11-19 MED ORDER — THIAMINE HCL 100 MG/ML IJ SOLN: 100.0000 mg | Freq: Every day | INTRAMUSCULAR | Status: DC

## 2021-11-19 MED ORDER — ACETAMINOPHEN 650 MG RE SUPP: 650.0000 mg | Freq: Four times a day (QID) | RECTAL | Status: DC | PRN

## 2021-11-19 MED ORDER — THIAMINE HCL 100 MG PO TABS
100.0000 mg | ORAL_TABLET | Freq: Every day | ORAL | Status: DC
  Administered 2021-11-19 – 2021-11-20 (×2): 100 mg via ORAL
  Filled 2021-11-19 (×2): qty 1

## 2021-11-19 MED ORDER — PHENYTOIN SODIUM 50 MG/ML IJ SOLN
500.0000 mg | Freq: Once | INTRAMUSCULAR | Status: AC
  Administered 2021-11-19: 500 mg via INTRAVENOUS
  Filled 2021-11-19: qty 10

## 2021-11-19 MED ORDER — LORAZEPAM 2 MG/ML IJ SOLN: 0.0000 mg | Freq: Two times a day (BID) | INTRAMUSCULAR | Status: DC

## 2021-11-19 MED ORDER — LORAZEPAM 2 MG/ML IJ SOLN
0.0000 mg | Freq: Four times a day (QID) | INTRAMUSCULAR | Status: DC
  Administered 2021-11-19: 2 mg via INTRAVENOUS
  Administered 2021-11-19: 1 mg via INTRAVENOUS
  Filled 2021-11-19 (×3): qty 1

## 2021-11-19 MED ORDER — PHENOBARBITAL 32.4 MG PO TABS
32.4000 mg | ORAL_TABLET | Freq: Two times a day (BID) | ORAL | Status: DC
  Administered 2021-11-19 – 2021-11-20 (×4): 32.4 mg via ORAL
  Filled 2021-11-19 (×4): qty 1

## 2021-11-19 MED ORDER — LISINOPRIL 10 MG PO TABS
40.0000 mg | ORAL_TABLET | Freq: Every day | ORAL | Status: DC
Start: 2021-11-19 — End: 2021-11-20
  Administered 2021-11-19 – 2021-11-20 (×2): 40 mg via ORAL
  Filled 2021-11-19 (×2): qty 4

## 2021-11-19 MED ORDER — MORPHINE SULFATE (PF) 2 MG/ML IV SOLN: 2.0000 mg | INTRAVENOUS | Status: DC | PRN

## 2021-11-19 MED ORDER — LORAZEPAM 1 MG PO TABS: 1.0000 mg | ORAL_TABLET | ORAL | Status: DC | PRN

## 2021-11-19 MED ORDER — HEPARIN SODIUM (PORCINE) 5000 UNIT/ML IJ SOLN
5000.0000 [IU] | Freq: Three times a day (TID) | INTRAMUSCULAR | Status: DC
  Administered 2021-11-19 – 2021-11-20 (×4): 5000 [IU] via SUBCUTANEOUS
  Filled 2021-11-19 (×4): qty 1

## 2021-11-19 MED ORDER — POTASSIUM CHLORIDE 10 MEQ/100ML IV SOLN
10.0000 meq | INTRAVENOUS | Status: AC
  Administered 2021-11-19 (×4): 10 meq via INTRAVENOUS
  Filled 2021-11-19 (×4): qty 100

## 2021-11-19 MED ORDER — SODIUM CHLORIDE 0.9 % IV SOLN: INTRAVENOUS | Status: DC

## 2021-11-19 MED ORDER — PANTOPRAZOLE SODIUM 40 MG PO TBEC
40.0000 mg | DELAYED_RELEASE_TABLET | Freq: Two times a day (BID) | ORAL | Status: DC
  Administered 2021-11-19 – 2021-11-20 (×3): 40 mg via ORAL
  Filled 2021-11-19 (×3): qty 1

## 2021-11-19 MED ORDER — ONDANSETRON HCL 4 MG PO TABS: 4.0000 mg | ORAL_TABLET | Freq: Four times a day (QID) | ORAL | Status: DC | PRN

## 2021-11-19 MED ORDER — ONDANSETRON HCL 4 MG/2ML IJ SOLN: 4.0000 mg | Freq: Four times a day (QID) | INTRAMUSCULAR | Status: DC | PRN

## 2021-11-19 MED ORDER — MAGNESIUM SULFATE 2 GM/50ML IV SOLN
2.0000 g | Freq: Once | INTRAVENOUS | Status: AC
  Administered 2021-11-19: 2 g via INTRAVENOUS
  Filled 2021-11-19: qty 50

## 2021-11-19 MED ORDER — OXYCODONE HCL 5 MG PO TABS: 5.0000 mg | ORAL_TABLET | Freq: Four times a day (QID) | ORAL | Status: DC | PRN

## 2021-11-19 MED ORDER — ACETAMINOPHEN 325 MG PO TABS: 650.0000 mg | ORAL_TABLET | Freq: Four times a day (QID) | ORAL | Status: DC | PRN

## 2021-11-19 MED ORDER — PHENYTOIN SODIUM EXTENDED 100 MG PO CAPS
100.0000 mg | ORAL_CAPSULE | Freq: Three times a day (TID) | ORAL | Status: DC
  Administered 2021-11-19 – 2021-11-20 (×4): 100 mg via ORAL
  Filled 2021-11-19 (×7): qty 1

## 2021-11-19 MED ORDER — POTASSIUM CHLORIDE 20 MEQ PO PACK
40.0000 meq | PACK | Freq: Once | ORAL | Status: AC
  Administered 2021-11-19: 40 meq via ORAL
  Filled 2021-11-19: qty 2

## 2021-11-19 MED ORDER — HEPARIN SODIUM (PORCINE) 5000 UNIT/ML IJ SOLN
5000.0000 [IU] | Freq: Three times a day (TID) | INTRAMUSCULAR | Status: DC
  Filled 2021-11-19: qty 1

## 2021-11-19 MED ORDER — POTASSIUM CHLORIDE 10 MEQ/100ML IV SOLN
10.0000 meq | INTRAVENOUS | Status: AC
  Administered 2021-11-19 (×3): 10 meq via INTRAVENOUS
  Filled 2021-11-19 (×3): qty 100

## 2021-11-19 MED ORDER — LORAZEPAM 2 MG/ML IJ SOLN: 1.0000 mg | INTRAMUSCULAR | Status: DC | PRN

## 2021-11-19 MED ORDER — SUCRALFATE 1 GM/10ML PO SUSP
1.0000 g | Freq: Three times a day (TID) | ORAL | Status: DC
  Administered 2021-11-19 – 2021-11-20 (×5): 1 g via ORAL
  Filled 2021-11-19 (×4): qty 10

## 2021-11-19 MED ORDER — DARIFENACIN HYDROBROMIDE ER 7.5 MG PO TB24
7.5000 mg | ORAL_TABLET | Freq: Every day | ORAL | Status: DC
  Administered 2021-11-19 – 2021-11-20 (×2): 7.5 mg via ORAL
  Filled 2021-11-19 (×2): qty 1

## 2021-11-19 MED ORDER — OXYCODONE HCL 5 MG PO TABS: 5.0000 mg | ORAL_TABLET | ORAL | Status: DC | PRN

## 2021-11-19 MED ORDER — ENSURE ENLIVE PO LIQD
237.0000 mL | Freq: Three times a day (TID) | ORAL | Status: DC
  Administered 2021-11-19 – 2021-11-20 (×4): 237 mL via ORAL
  Filled 2021-11-19 (×8): qty 237

## 2021-11-19 MED ORDER — ADULT MULTIVITAMIN W/MINERALS CH
1.0000 | ORAL_TABLET | Freq: Every day | ORAL | Status: DC
  Administered 2021-11-19 – 2021-11-20 (×2): 1 via ORAL
  Filled 2021-11-19 (×2): qty 1

## 2021-11-19 MED ORDER — PHENYTOIN SODIUM 50 MG/ML IJ SOLN
INTRAMUSCULAR | Status: AC
  Filled 2021-11-19: qty 10

## 2021-11-19 NOTE — Assessment & Plan Note (Addendum)
-  With history of residual right hemiparesis and dysarthria. -No new focal deficits appreciated -Continue risk factor modifications.

## 2021-11-19 NOTE — Assessment & Plan Note (Addendum)
-  In the setting of poor oral intake and alcohol consumption -Electrolytes repletion and maintenance supplementation has been started. -Recommend repeating basic metabolic panel at follow-up visit to assess electrolytes stability.

## 2021-11-19 NOTE — ED Notes (Signed)
EEG being performed at this time

## 2021-11-19 NOTE — Assessment & Plan Note (Addendum)
-  Normal lipase, normal WBCs -Patient's symptoms most likely associated with gastritis -Instructed to be compliant with Protonix and alcohol cessation. -Lifestyle changes discussed with patient.

## 2021-11-19 NOTE — TOC Initial Note (Signed)
Transition of Care [facility]) - Initial/Assessment Note    Patient Details  Name: [name] MRN: [id number] Date of Birth: 12-24-1955  Transition of Care [facility]) CM/SW Contact:    Ihor Gully, LCSW Phone Number: 11/19/2021, 3:43 PM  Clinical Narrative:                 Patient from home. Admitted for seizure like activity. TOC consulted for SA resources. Patient declines SA resources as she does not feel she has an alcohol use issue.  TOC Signing off.  Expected Discharge Plan: Home/Self Care Barriers to Discharge: Continued Medical Work up   Patient Goals and CMS Choice Patient states their goals for this hospitalization and ongoing recovery are:: return home      Expected Discharge Plan and Services Expected Discharge Plan: Home/Self Care                                              Prior Living Arrangements/Services     Patient language and need for interpreter reviewed:: Yes Do you feel safe going back to the place where you live?: Yes      Need for Family Participation in Patient Care: Yes (Comment) Care giver support system in place?: Yes (comment)   Criminal Activity/Legal Involvement Pertinent to Current Situation/Hospitalization: No - Comment as needed  Activities of Daily Living Home Assistive Devices/Equipment: Wheelchair ADL Screening (condition at time of admission) Patient's cognitive ability adequate to safely complete daily activities?: No Is the patient deaf or have difficulty hearing?: Yes Does the patient have difficulty seeing, even when wearing glasses/contacts?: Yes Does the patient have difficulty concentrating, remembering, or making decisions?: Yes Patient able to express need for assistance with ADLs?: Yes Does the patient have difficulty dressing or bathing?: Yes Independently performs ADLs?: No Communication: Independent Dressing (OT): Needs assistance Is this a change from baseline?: Pre-admission baseline Grooming: Needs  assistance Is this a change from baseline?: Pre-admission baseline Feeding: Independent Bathing: Needs assistance Is this a change from baseline?: Pre-admission baseline Toileting: Dependent Is this a change from baseline?: Pre-admission baseline In/Out Bed: Needs assistance Is this a change from baseline?: Pre-admission baseline Walks in Home: Needs assistance Is this a change from baseline?: Pre-admission baseline Does the patient have difficulty walking or climbing stairs?: Yes Weakness of Legs: Both Weakness of Arms/Hands: Both  Permission Sought/Granted                  Emotional Assessment     Affect (typically observed): Appropriate Orientation: : Oriented to Self, Oriented to Place, Oriented to  Time, Oriented to Situation Alcohol / Substance Use: Not Applicable Psych Involvement: No (comment)  Admission diagnosis:  Hypokalemia [E87.6] Generalized abdominal pain [R10.84] Subtherapeutic serum dilantin level [R78.89] Seizure-like activity ([place]) [R56.9] Patient Active Problem List   Diagnosis Date Noted   Seizure-like activity ([place]) 11/19/2021   Essential hypertension 06/16/2021   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[place]) 06/16/2021   Acute lower UTI 05/28/2021   Severe sepsis ([place]) 05/28/2021   Lactic acidosis 12/19/2020   Encephalopathy acute 40/98/1191   Acute metabolic encephalopathy 47/82/9562   Acute blood loss anemia 11/02/2019   Overweight (BMI 25.0-29.9) 11/02/2019   Gait disturbance    Rectal bleeding 10/08/2019   Hyponatremia    Diarrhea 04/11/2019   Nausea, vomiting, and diarrhea    ARF (acute renal failure) ([place]) 04/10/2019   Acute kidney  injury ([place]) 01/22/2019   History of stroke 01/22/2019   Asthma 01/22/2019   Gastroenteritis 01/22/2019   Stroke ([place]) 06/14/2018   Cirrhosis of liver without ascites ([place]) 04/21/2017   AKI (acute kidney injury) ([place]) 07/30/2016   Anemia 11/25/2015   Abnormal liver function    Coffee ground emesis    Reflux esophagitis     Sinus tachycardia ([place]) 10/10/2014   Hypokalemia 10/10/2014   Seizure disorder ([place]) 10/10/2014   Alcohol abuse 09/05/2012   Odynophagia 09/05/2012   GERD (gastroesophageal reflux disease) 09/05/2012   Abdominal pain 09/05/2012   FX CLOSED TIBIA NOS 07/27/2007   STROKE-With also h/o Aneurysm clipping? 07/25/2007   Seizures ([place]) 07/25/2007   PCP:  Carrolyn Meiers, MD Pharmacy:   [facility] [address] [place] [address] Phone: [phone] Fax: [phone]     Social Determinants of Health (SDOH) Interventions    Readmission Risk Interventions Readmission Risk Prevention Plan 11/05/2019 04/13/2019  Transportation Screening Complete Complete  PCP or Specialist Appt within 5-7 Days - Complete  PCP or Specialist Appt within 3-5 Days Complete -  Home Care Screening - Complete  Medication Review (RN CM) - Complete  HRI or Home Care Consult Complete -  Social Work Consult for Recovery Care Planning/Counseling Complete -  Palliative Care Screening Not Applicable -  Medication Review Press photographer) Complete -  Some recent data might be hidden

## 2021-11-19 NOTE — Progress Notes (Signed)
Patient seen and examined.  Admitted after midnight secondary to seizure activity.  Patient with underlying history of seizure disorder, concern for medication compliance and active alcohol use.  Hemodynamically stable at time of evaluation without acute withdrawal symptoms appreciated unable to follow simple commands.  No chest pain, no nausea, no vomiting.  Please refer to H&P written by Dr.Zierle-Ghosh for further info/details on admission.  Plan: -follow EEG -continue CIWA protocol, folic acid and thiamine -continue use of dilantin/phenobarbital  -replete electrolytes and continue IVF's.  Barton Dubois MD [phone]

## 2021-11-19 NOTE — ED Notes (Signed)
Pt asked for something to eat, gave crackers and pudding. Approved per an Therapist, sports

## 2021-11-19 NOTE — Procedures (Signed)
Patient Name: [name]  MRN: [id number]  Epilepsy Attending: Lora Havens  Referring Physician/Provider: Rolla Plate, DO Date: 11/19/2021 Duration: 24.50 mins  Patient history: 66yo F with seizure like activity. EEG to evaluate for seizure  Level of alertness: Awake, asleep  AEDs during EEG study: PHT, Phenobarb, Ativan  Technical aspects: This EEG study was done with scalp electrodes positioned according to the 10-20 International system of electrode placement. Electrical activity was acquired at a sampling rate of 500Hz  and reviewed with a high frequency filter of 70Hz  and a low frequency filter of 1Hz . EEG data were recorded continuously and digitally stored.   Description: The posterior dominant rhythm consists of 8 Hz activity of moderate voltage (25-35 uV) seen predominantly in posterior head regions, symmetric and reactive to eye opening and eye closing. Sleep was characterized by vertex waves, sleep spindles (12 to 14 Hz), maximal frontocentral region. EEG showed intermittent generalized 3 to 6 Hz theta-delta slowing. Hyperventilation and photic stimulation were not performed.     ABNORMALITY - Intermittent slow, generalized  IMPRESSION: This study is suggestive of mild to moderate diffuse encephalopathy, nonspecific etiology but likely related to sedation, toxic-metabolic etiology, anoxic/hypoxic brain injury. No seizures or epileptiform discharges were seen throughout the recording.   [name] [name]

## 2021-11-19 NOTE — H&P (Signed)
History and Physical    Patient: [name] YDX:[id number] DOB: 06-21-1956 DOA: 11/18/2021 DOS: the patient was seen and examined on 11/19/2021 PCP: Carrolyn Meiers, MD  Patient coming from: Home  Chief Complaint:  Chief Complaint  Patient presents with   Seizures    HPI: [name] is a 66 y.o. 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  Patient reports that she does not remember having a seizure.  When asked if she has ever had a seizure due to not drinking she said yes it happens most of the time.  When asked most recent event she says today.  She then again says she does not remember anything about a seizure.  She thinks she is here for abdominal pain.  Patient was last evaluated for abdominal pain 2 weeks ago as well.  Patient reports her abdominal pain is her lower abdomen.  It feels like a pressure.  Eating does not affect it.  Her last normal meal per her report was yesterday.  Bowel movements do make it worse.  She reports that she occasionally sees blood in the stool.  She reports no known history of hemorrhoids.  She has had some nausea and vomiting with the last episode being yesterday.  In the ED, during her work-up for abdominal pain patient was found to have significant electrolyte abnormalities.  She is a known alcoholic and is not likely eating at home.  She was found to have a normal lipase.  Given her history of seizures she is on phenytoin and phenobarbital and both were subtherapeutic in the ED.  These medications will be restarted.  Ethanol level was pending admission, but was later found to be 203.  EKG shows a heart rate of 85, sinus rhythm, QTc borderline prolonged at 499.  2 g of magnesium given in the ED, 70 mEq of potassium given in the ED.  Admission requested  for possible alcohol withdrawal seizure.  Review of Systems: As mentioned in the history of present illness. All other systems reviewed and are negative. Past Medical History:  Diagnosis Date   Alcohol abuse    Asthma    Contracture of muscle of hand    right   GERD (gastroesophageal reflux disease)    HTN (hypertension)    PUD (peptic ulcer disease)    remote   Seizures ([place])    since stroke, no recent seizures   Sleep apnea    Stroke [facility])    age 42, required brain surgery effected right side   Past Surgical History:  Procedure Laterality Date   ABDOMINAL HYSTERECTOMY  2004   complete   BRAIN SURGERY     age 4 stroke   COLONOSCOPY     COLONOSCOPY WITH PROPOFOL  10/03/2012   Dr. [name]: moderate diverticulosis, small internal hemorrhoids, TUBULAR ADENOMA. Next colonoscopy 2023 per Dr. [name].   ESOPHAGOGASTRODUODENOSCOPY N/A 10/18/2014   Dr. Gala Romney during inpatient hospitalization: severe exudative/erosive reflux esophagitis as source of trivial upper GI bleed. No varices    ESOPHAGOGASTRODUODENOSCOPY (EGD) WITH PROPOFOL  10/03/2012   Dr. [name]: stricture at Roland junction s/p dilation, mild gastritis negative H.pylori    ESOPHAGOGASTRODUODENOSCOPY (EGD) WITH PROPOFOL N/A 12/27/2017   Grade D esophagitis. Medium-sized hiatal hernia, normal duodenum   right tib-fib fracture  2008   SAVORY DILATION  10/03/2012  Procedure: SAVORY DILATION;  Surgeon: Danie Binder, MD;  Location: [facility];  Service: Endoscopy;  Laterality: N/A;  started at 1303,  dilated 12.8-16mm   Social History:  reports that she quit smoking about 7 years ago. Her smoking use included cigarettes. She has a 0.36 pack-year smoking history. She has never used smokeless tobacco. She reports current alcohol use of about 35.0 standard drinks per week. She reports that she does not use drugs.  No Known Allergies  Family History  Problem Relation Age of Onset   Liver disease Sister        etoh   Colon cancer Neg Hx      Prior to Admission medications   Medication Sig Start Date End Date Taking? Authorizing Provider  albuterol (PROVENTIL HFA;VENTOLIN HFA) 108 (90 BASE) MCG/ACT inhaler Inhale 2 puffs into the lungs every 6 (six) hours as needed for wheezing or shortness of breath.    Yes [provider]  ENSURE PLUS (ENSURE PLUS) LIQD Take 237 mLs by mouth 3 (three) times daily between meals.   Yes [provider]  LINZESS 290 MCG CAPS capsule TAKE (1) CAPSULE BY MOUTH EVERY DAY. 10/26/21  Yes Aliene Altes S, PA-C  loperamide (IMODIUM) 2 MG capsule Take 1 capsule (2 mg total) by mouth 3 (three) times daily as needed for diarrhea or loose stools. 06/03/21  Yes Kathie Dike, MD  PHENobarbital (LUMINAL) 32.4 MG tablet Take 32.4 mg by mouth 2 (two) times daily. 07/13/19  Yes [provider]  solifenacin (VESICARE) 5 MG tablet Take 5 mg by mouth daily. 03/11/20  Yes [provider]  amoxicillin-clavulanate (AUGMENTIN) 875-125 MG tablet Take 1 tablet by mouth every 12 (twelve) hours. 10/17/21   Rancour, Annie Main, MD  folic acid (FOLVITE) 409 MCG tablet Take 400 mcg by mouth daily.    [provider]  HYDROcodone-acetaminophen (NORCO/VICODIN) 5-325 MG tablet One tablet every six hours for pain.  Limit 7 days. Patient not taking: Reported on 11/18/2021 09/29/21   Sanjuana Kava, MD  lisinopril (ZESTRIL) 40 MG tablet Take 1 tablet (40 mg total) by mouth daily. Patient not taking: Reported on 11/18/2021 10/09/19   Roxan Hockey, MD  pantoprazole (PROTONIX) 40 MG tablet Take 1 tablet (40 mg total) by mouth 2 (two) times daily before a meal. Patient not taking: Reported on 11/18/2021 11/05/19   Doree Albee, MD  phenytoin (DILANTIN) 100 MG ER capsule Take 100 mg by mouth 3 (three) times daily.    [provider]  potassium chloride SA (KLOR-CON M) 20 MEQ tablet One po bid x 3 days, then one po once a day Patient not taking: Reported on 11/18/2021 10/31/21   Lajean Saver, MD    Physical Exam: Vitals:   11/19/21 0030 11/19/21 0037 11/19/21 0105 11/19/21 0357  BP: 135/86 135/86  (!) 159/88  Pulse:  88  (!) 108  Resp: 14   (!) 21  Temp:   98.2 F (36.8 C)   TempSrc:   Oral   SpO2:    99%  Weight:      Height:       1.  General: Patient lying supine in bed,  no acute distress   2. Psychiatric: Alert and oriented x 3, mood and behavior normal for situation, pleasant and cooperative with exam   3. Neurologic: Speech is slurred and language is normal, face is symmetric, left extremities voluntarily, right-sided deficits from previous stroke   4. HEENMT:  Head is atraumatic, normocephalic, pupils  reactive to light, neck is supple, trachea is midline, mucous membranes are moist   5. Respiratory : Lungs are clear to auscultation bilaterally without wheezing, rhonchi, rales, no cyanosis, no increase in work of breathing or accessory muscle use   6. Cardiovascular : Heart rate normal, rhythm is regular, no murmurs, rubs or gallops, no peripheral edema, peripheral pulses palpated   7. Gastrointestinal:  Abdomen is soft, nondistended, tender to palpation in the lower abdomen more on the left than the right, bowel sounds active, no masses or organomegaly palpated   8. Skin:  Skin is warm, dry and intact without rashes, acute lesions, or ulcers on limited exam   9.Musculoskeletal:  No acute deformities or trauma, no asymmetry in tone, no peripheral edema, peripheral pulses palpated, no tenderness to palpation in the extremities   Data Reviewed: Laboratory data reveals no leukocytosis, stable hemoglobin at 10.3, which then improved to 11.2 on morning labs.  Hypokalemia 2.1, magnesium 1.7.  Lipase 35-not indicative of pancreatitis.  Pain is not in the correct location for pancreatitis either.  Alcohol level is 203 which is very close to her baseline.  Phenytoin and phenobarbital levels were subtherapeutic.  These medications restarted at admission.     Assessment and Plan: * Seizure-like activity ([place]) Multifactorial, decreasing alcohol intake?  Versus decrease in compliance with phenytoin and phenobarbital? Continue CIWA Restart phenytoin and phenobarbital Consult neuro  Alcohol abuse- (present on admission) - Currently intoxicated with a blood alcohol level of 293 -CIWA protocol -Advised on importance of cessation, but patient is currently in denial regarding alcohol reporting that she only drinks 3 beers at a time occasionally -Continue to monitor  History of stroke With history of right upper and lower extremity weakness Continue to monitor  Hyponatremia- (present on admission) Most likely secondary to beer [place] mania Continue gentle IV hydration Encourage p.o. intake  Hypokalemia- (present on admission) Likely secondary to poor p.o. intake given mostly intake is likely alcohol Replace and recheck in the a.m. Monitor on telemetry  Abdominal pain- (present on admission) Lipase is normal, no leukocytosis, chronic abdominal pain Controlled pain scale Continue to monitor  GERD (gastroesophageal reflux disease)- (present on admission) - Continue Protonix       Advance Care Planning:   Code Status: Full Code   Consults: Neurology  Family Communication: No family at bedside  Severity of Illness: The appropriate patient status for this patient is INPATIENT. Inpatient status is judged to be reasonable and necessary in order to provide the required intensity of service to ensure the patient's safety. The patient's presenting symptoms, physical exam findings, and initial radiographic and laboratory data in the context of their chronic comorbidities is felt to place them at high risk for further clinical deterioration. Furthermore, it is not anticipated that the patient will be medically stable for discharge from the hospital within 2 midnights of admission.   * I certify that at the point of admission it is my clinical  judgment that the patient will require inpatient hospital care spanning beyond 2 midnights from the point of admission due to high intensity of service, high risk for further deterioration and high frequency of surveillance required.*  Author: Rolla Plate, DO 11/19/2021 5:36 AM  For on call review [email or web address].

## 2021-11-19 NOTE — Progress Notes (Signed)
EEG complete - results pending 

## 2021-11-19 NOTE — Assessment & Plan Note (Addendum)
-  Multifactorial, in the setting of decreasing seizure threshold with alcohol intake,  Versus medication non-compliance and probably even component of withdrawal if she was drinking heavier before. -alcohol level was 293 on presentation -Antiepileptic medications were subtherapeutic level (especially Dilantin) -No seizure activity appreciated during hospitalization -Patient received fluid resuscitation, loading doses of antiepileptic drugs and electrolytes repleted. -Advised to stop alcohol consumption and to take medications as prescribed. -Electroencephalogram demonstrated no acute seizure waves appreciated.  Positive toxic encephalopathy pattern. -Continue outpatient follow-up with neurology service.

## 2021-11-19 NOTE — Assessment & Plan Note (Addendum)
-  Continue Protonix ?-Lifestyle modifications discussed with patient. ?

## 2021-11-19 NOTE — Assessment & Plan Note (Addendum)
In the setting of poor oral intake and alcohol consumption -Electrolytes repletion and maintenance supplementation has been started. -Recommend repeating basic metabolic panel at follow-up visit to assess electrolytes stability.  -Patient advised to maintain adequate hydration and nutrition.

## 2021-11-19 NOTE — Plan of Care (Signed)
Pt brought to 300 floor from ED via stretcher bed, transferred to bed with two assist. Pt weak on the R side with sleepiness, able to follow simple commands and does speak with slow response times. Will continue to monitor at this time.

## 2021-11-19 NOTE — Assessment & Plan Note (Addendum)
-   Actively intoxicated at time of admission with an alcohol level of 293 -No withdrawal symptoms appreciated. -Thiamine and folic acid has been recommended -Cessation counseling has been provided. -Continue assisting patient with alcohol cessation as an outpatient. -Patient chronic phenobarbital use as antiepileptic drugs can also help with prevention of withdrawal symptoms if patient is compliant with medication.

## 2021-11-19 NOTE — ED Notes (Signed)
Herbie [place], pt's significant other, phone number [phone]. If pt gets a bed, he would like to be updated on plan of care

## 2021-11-20 DIAGNOSIS — E871 Hypo-osmolality and hyponatremia: Secondary | ICD-10-CM

## 2021-11-20 DIAGNOSIS — E876 Hypokalemia: Secondary | ICD-10-CM

## 2021-11-20 DIAGNOSIS — R7889 Finding of other specified substances, not normally found in blood: Secondary | ICD-10-CM

## 2021-11-20 DIAGNOSIS — F1010 Alcohol abuse, uncomplicated: Secondary | ICD-10-CM

## 2021-11-20 DIAGNOSIS — K219 Gastro-esophageal reflux disease without esophagitis: Secondary | ICD-10-CM

## 2021-11-20 MED ORDER — MAGNESIUM OXIDE -MG SUPPLEMENT 400 (240 MG) MG PO TABS: 400.0000 mg | ORAL_TABLET | Freq: Every day | ORAL | 0 refills | Status: AC

## 2021-11-20 MED ORDER — PANTOPRAZOLE SODIUM 40 MG PO TBEC: 40.0000 mg | DELAYED_RELEASE_TABLET | Freq: Two times a day (BID) | ORAL | 1 refills | Status: DC

## 2021-11-20 MED ORDER — ADULT MULTIVITAMIN W/MINERALS CH: 1.0000 | ORAL_TABLET | Freq: Every day | ORAL | Status: DC

## 2021-11-20 MED ORDER — PHENOBARBITAL 32.4 MG PO TABS: 32.4000 mg | ORAL_TABLET | Freq: Two times a day (BID) | ORAL | 1 refills | Status: DC

## 2021-11-20 MED ORDER — THIAMINE HCL 100 MG PO TABS: 100.0000 mg | ORAL_TABLET | Freq: Every day | ORAL | 1 refills | Status: DC

## 2021-11-20 MED ORDER — PHENYTOIN SODIUM EXTENDED 100 MG PO CAPS: 100.0000 mg | ORAL_CAPSULE | Freq: Three times a day (TID) | ORAL | 1 refills | Status: DC

## 2021-11-20 MED ORDER — POTASSIUM CHLORIDE CRYS ER 20 MEQ PO TBCR: 20.0000 meq | EXTENDED_RELEASE_TABLET | Freq: Two times a day (BID) | ORAL | 0 refills | Status: DC

## 2021-11-20 NOTE — Discharge Summary (Signed)
Physician Discharge Summary   Patient: CHANTAE SOO MRN: [id number] DOB: [birth year]  Admit date:     11/18/2021  Discharge date: 11/20/21  Discharge Physician: Barton Dubois   PCP: Carrolyn Meiers, MD   Recommendations at discharge:  Repeat basic metabolic panel to follow electrolytes and renal function. Continue assisting patient with alcohol cessation. Make sure patient has been compliant with medication Repeat Dilantin level, important to follow therapeutic level stability and make adjustment as needed.    Discharge Diagnoses: Principal Problem:   Seizure-like activity ([place]) Active Problems:   Alcohol abuse   GERD (gastroesophageal reflux disease)   Abdominal pain   Hypokalemia   History of stroke   Hyponatremia   Subtherapeutic serum dilantin level  Resolved Problems:   * No resolved hospital problems. *  Brief admission narrative Course: As per H&P written by Dr.Zierle-Ghosh on 11/19/21 Lady Deutscher Polich is a 66 y.o. 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  Patient reports that she does not remember having a seizure.  When asked if she has ever had a seizure due to not drinking she said yes it happens most of the time.  When asked most recent event she says today.  She then again says she does not remember anything about a seizure.  She thinks she is here for abdominal pain.  Patient was last evaluated for abdominal pain 2 weeks ago as well.  Patient reports her abdominal pain is her lower abdomen.  It feels like a pressure.  Eating does not affect it.  Her last normal meal per her report was yesterday.  Bowel movements do make it worse.  She reports that she occasionally sees blood in the stool.  She reports no known history of hemorrhoids.  She has had some nausea and  vomiting with the last episode being yesterday.  In the ED, during her work-up for abdominal pain patient was found to have significant electrolyte abnormalities.  She is a known alcoholic and is not likely eating at home.  She was found to have a normal lipase.  Given her history of seizures she is on phenytoin and phenobarbital and both were subtherapeutic in the ED.  These medications will be restarted.  Ethanol level was pending admission, but was later found to be 203.  EKG shows a heart rate of 85, sinus rhythm, QTc borderline prolonged at 499.  2 g of magnesium given in the ED, 70 mEq of potassium given in the ED.  Admission requested for possible alcohol withdrawal seizure.  Assessment and Plan: * Seizure-like activity ([place]) -Multifactorial, in the setting of decreasing seizure threshold with alcohol intake,  Versus medication non-compliance and probably even component of withdrawal if she was drinking heavier before. -alcohol level was 293 on presentation -Antiepileptic medications were subtherapeutic level (especially Dilantin) -No seizure activity appreciated during hospitalization -Patient received fluid resuscitation, loading doses of antiepileptic drugs and electrolytes repleted. -Advised to stop alcohol consumption and to take medications as prescribed. -Electroencephalogram demonstrated no acute seizure waves appreciated.  Positive toxic encephalopathy pattern. -Continue outpatient follow-up with neurology service.  Subtherapeutic serum dilantin level - As mentioned above medication compliance has been encouraged -Patient received loading dilantin dose. -Repeat level at follow-up visit.  Hyponatremia- (present on admission) In the setting of poor oral intake and alcohol consumption -Electrolytes repletion and maintenance supplementation  has been started. -Recommend repeating basic metabolic panel at follow-up visit to assess electrolytes stability.  -Patient advised to maintain  adequate hydration and nutrition.  History of stroke -With history of residual right hemiparesis and dysarthria. -No new focal deficits appreciated -Continue risk factor modifications.   Hypokalemia- (present on admission) -In the setting of poor oral intake and alcohol consumption -Electrolytes repletion and maintenance supplementation has been started. -Recommend repeating basic metabolic panel at follow-up visit to assess electrolytes stability.   Abdominal pain- (present on admission) -Normal lipase, normal WBCs -Patient's symptoms most likely associated with gastritis -Instructed to be compliant with Protonix and alcohol cessation. -Lifestyle changes discussed with patient.   GERD (gastroesophageal reflux disease)- (present on admission) -Continue Protonix -Lifestyle modifications discussed with patient.  Alcohol abuse- (present on admission) - Actively intoxicated at time of admission with an alcohol level of 293 -No withdrawal symptoms appreciated. -Thiamine and folic acid has been recommended -Cessation counseling has been provided. -Continue assisting patient with alcohol cessation as an outpatient. -Patient chronic phenobarbital use as antiepileptic drugs can also help with prevention of withdrawal symptoms if patient is compliant with medication.   Consultants: None Procedures performed: See below for x-ray reports; EEG: Demonstrating toxic metabolic encephalopathy pattern without acute epileptic waves. Disposition: Home Diet recommendation:  Regular diet  DISCHARGE MEDICATION: Allergies as of 11/20/2021   No Known Allergies      Medication List     STOP taking these medications    amoxicillin-clavulanate 875-125 MG tablet Commonly known as: AUGMENTIN   Ensure Plus Liqd   HYDROcodone-acetaminophen 5-325 MG tablet Commonly known as: NORCO/VICODIN   loperamide 2 MG capsule Commonly known as: IMODIUM       TAKE these medications    albuterol 108  (90 Base) MCG/ACT inhaler Commonly known as: VENTOLIN HFA Inhale 2 puffs into the lungs every 6 (six) hours as needed for wheezing or shortness of breath.   folic acid 301 MCG tablet Commonly known as: FOLVITE Take 400 mcg by mouth daily.   Linzess 290 MCG Caps capsule Generic drug: linaclotide TAKE (1) CAPSULE BY MOUTH EVERY DAY.   lisinopril 40 MG tablet Commonly known as: ZESTRIL Take 1 tablet (40 mg total) by mouth daily.   multivitamin with minerals Tabs tablet Take 1 tablet by mouth daily. Start taking on: November 21, 2021   pantoprazole 40 MG tablet Commonly known as: PROTONIX Take 1 tablet (40 mg total) by mouth 2 (two) times daily. What changed: when to take this   PHENobarbital 32.4 MG tablet Commonly known as: LUMINAL Take 1 tablet (32.4 mg total) by mouth 2 (two) times daily.   phenytoin 100 MG ER capsule Commonly known as: DILANTIN Take 1 capsule (100 mg total) by mouth 3 (three) times daily.   potassium chloride SA 20 MEQ tablet Commonly known as: KLOR-CON M One po bid x 3 days, then one po once a day   solifenacin 5 MG tablet Commonly known as: VESICARE Take 5 mg by mouth daily.   thiamine 100 MG tablet Take 1 tablet (100 mg total) by mouth daily. Start taking on: November 21, 2021        Follow-up Information     Fanta, Normajean Baxter, MD. Schedule an appointment as soon as possible for a visit in 10 day(s).   Specialty: Internal Medicine Contact information: [place] [address] [phone]                 Discharge Exam: Danley Danker Weights  11/18/21 1841  Weight: 66.7 kg   General exam: Alert, awake and following commands appropriately.  No further seizure activity or withdrawal symptoms appreciated.  Denies chest pain, nausea, vomiting and is tolerating diet. Respiratory system: Clear to auscultation. Respiratory effort normal.  Good saturation on room air. Cardiovascular system:RRR. No murmurs, rubs,  gallops.  No JVD Gastrointestinal system: Abdomen is nondistended, soft and nontender. No organomegaly or masses felt. Normal bowel sounds heard. Central nervous system: No new focal neurological deficits; patient with chronic right-sided hemiparesis and dysarthria. Extremities: No cyanosis, clubbing or edema. Skin: No petechiae. Psychiatry: Mood & affect appropriate.    Condition at discharge: stable and improved.  The results of significant diagnostics from this hospitalization (including imaging, microbiology, ancillary and laboratory) are listed below for reference.   Imaging Studies: EEG adult  Result Date: 12/05/21 Lora Havens, MD     12-05-2021 12:42 PM Patient Name: JESTINE BICKNELL MRN: [id number] Epilepsy Attending: Lora Havens Referring Physician/Provider: Rolla Plate, DO Date: 12-05-21 Duration: 24.50 mins Patient history: 66yo F with seizure like activity. EEG to evaluate for seizure Level of alertness: Awake, asleep AEDs during EEG study: PHT, Phenobarb, Ativan Technical aspects: This EEG study was done with scalp electrodes positioned according to the 10-20 International system of electrode placement. Electrical activity was acquired at a sampling rate of 500Hz  and reviewed with a high frequency filter of 70Hz  and a low frequency filter of 1Hz . EEG data were recorded continuously and digitally stored. Description: The posterior dominant rhythm consists of 8 Hz activity of moderate voltage (25-35 uV) seen predominantly in posterior head regions, symmetric and reactive to eye opening and eye closing. Sleep was characterized by vertex waves, sleep spindles (12 to 14 Hz), maximal frontocentral region. EEG showed intermittent generalized 3 to 6 Hz theta-delta slowing. Hyperventilation and photic stimulation were not performed.   ABNORMALITY - Intermittent slow, generalized IMPRESSION: This study is suggestive of mild to moderate diffuse encephalopathy, nonspecific etiology  but likely related to sedation, toxic-metabolic etiology, anoxic/hypoxic brain injury. No seizures or epileptiform discharges were seen throughout the recording. Lora Havens    Microbiology: Results for orders placed or performed during the hospital encounter of 11/18/21  Resp Panel by RT-PCR (Flu A&B, Covid) Nasopharyngeal Swab     Status: None   Collection Time: December 05, 2021  4:37 AM   Specimen: Nasopharyngeal Swab; Nasopharyngeal(NP) swabs in vial transport medium  Result Value Ref Range Status   SARS Coronavirus 2 by RT PCR NEGATIVE NEGATIVE Final    Comment: (NOTE) SARS-CoV-2 target nucleic acids are NOT DETECTED.  The SARS-CoV-2 RNA is generally detectable in upper respiratory specimens during the acute phase of infection. The lowest concentration of SARS-CoV-2 viral copies this assay can detect is 138 copies/mL. A negative result does not preclude SARS-Cov-2 infection and should not be used as the sole basis for treatment or other patient management decisions. A negative result may occur with  improper specimen collection/handling, submission of specimen other than nasopharyngeal swab, presence of viral mutation(s) within the areas targeted by this assay, and inadequate number of viral copies(<138 copies/mL). A negative result must be combined with clinical observations, patient history, and epidemiological information. The expected result is Negative.  Fact Sheet for Patients:  EntrepreneurPulse.com.au  Fact Sheet for Healthcare Providers:  IncredibleEmployment.be  This test is no t yet approved or cleared by the [place] FDA and  has been authorized for detection and/or diagnosis of SARS-CoV-2 by FDA under an Emergency Use Authorization (  EUA). This EUA will remain  in effect (meaning this test can be used) for the duration of the COVID-19 declaration under Section 564(b)(1) of the Act, 21 U.S.C.section 360bbb-3(b)(1), unless the  authorization is terminated  or revoked sooner.       Influenza A by PCR NEGATIVE NEGATIVE Final   Influenza B by PCR NEGATIVE NEGATIVE Final    Comment: (NOTE) The Xpert Xpress SARS-CoV-2/FLU/RSV plus assay is intended as an aid in the diagnosis of influenza from Nasopharyngeal swab specimens and should not be used as a sole basis for treatment. Nasal washings and aspirates are unacceptable for Xpert Xpress SARS-CoV-2/FLU/RSV testing.  Fact Sheet for Patients: EntrepreneurPulse.com.au  Fact Sheet for Healthcare Providers: IncredibleEmployment.be  This test is not yet approved or cleared by the [place] FDA and has been authorized for detection and/or diagnosis of SARS-CoV-2 by FDA under an Emergency Use Authorization (EUA). This EUA will remain in effect (meaning this test can be used) for the duration of the COVID-19 declaration under Section 564(b)(1) of the Act, 21 U.S.C. section 360bbb-3(b)(1), unless the authorization is terminated or revoked.  Performed at [facility], [address], [place], [place] [address]     Labs: CBC: Recent Labs  Lab 11/18/21 1943 11/19/21 0337  WBC 5.5 3.5*  NEUTROABS 2.8 1.5*  HGB 10.3* 11.2*  HCT 32.4* 34.4*  MCV 83.7 84.5  PLT 152 277   Basic Metabolic Panel: Recent Labs  Lab 11/18/21 1943 11/19/21 0049 11/19/21 0337  NA 125*  --  136  K 2.1*  --  2.7*  CL 85*  --  93*  CO2 25  --  27  GLUCOSE 99  --  94  BUN 7*  --  5*  CREATININE 0.58  --  0.54  CALCIUM 8.2*  --  8.9  MG  --  1.7 2.6*   Liver Function Tests: Recent Labs  Lab 11/19/21 0337  AST 36  ALT 16  ALKPHOS 117  BILITOT 0.3  PROT 7.9  ALBUMIN 3.5   CBG: No results for input(s): GLUCAP in the last 168 hours.  Discharge time spent: greater than 30 minutes.  Signed: Barton Dubois, MD [facility] 11/20/2021

## 2021-11-20 NOTE — Assessment & Plan Note (Signed)
-   As mentioned above medication compliance has been encouraged -Patient received loading dilantin dose. -Repeat level at follow-up visit.

## 2021-11-28 ENCOUNTER — Other Ambulatory Visit: Payer: Self-pay

## 2021-11-28 ENCOUNTER — Encounter (HOSPITAL_COMMUNITY): Payer: Self-pay

## 2021-11-28 ENCOUNTER — Emergency Department (HOSPITAL_COMMUNITY)
Admission: EM | Admit: 2021-11-28 | Discharge: 2021-11-29 | Disposition: A | Discharge status: Home / Self Care | Payer: 59 | Attending: Emergency Medicine | Admitting: Emergency Medicine

## 2021-11-28 DIAGNOSIS — K7460 Unspecified cirrhosis of liver: Secondary | ICD-10-CM | POA: Diagnosis not present

## 2021-11-28 DIAGNOSIS — Z79899 Other long term (current) drug therapy: Secondary | ICD-10-CM | POA: Insufficient documentation

## 2021-11-28 DIAGNOSIS — K5730 Diverticulosis of large intestine without perforation or abscess without bleeding: Secondary | ICD-10-CM | POA: Insufficient documentation

## 2021-11-28 DIAGNOSIS — K2900 Acute gastritis without bleeding: Secondary | ICD-10-CM | POA: Insufficient documentation

## 2021-11-28 DIAGNOSIS — N390 Urinary tract infection, site not specified: Secondary | ICD-10-CM | POA: Insufficient documentation

## 2021-11-28 DIAGNOSIS — R1010 Upper abdominal pain, unspecified: Secondary | ICD-10-CM | POA: Diagnosis present

## 2021-11-28 NOTE — ED Triage Notes (Signed)
[facility]- pt presents from home for abdominal pain. Pt has history of cirrhosis and admits to drinking multiple beers tonight but not able to give a number. Pt is alert but not able to form words.

## 2021-11-29 ENCOUNTER — Emergency Department (HOSPITAL_COMMUNITY): Payer: 59

## 2021-11-29 DIAGNOSIS — K5730 Diverticulosis of large intestine without perforation or abscess without bleeding: Secondary | ICD-10-CM | POA: Diagnosis not present

## 2021-11-29 LAB — CBC WITH DIFFERENTIAL/PLATELET
Abs Immature Granulocytes: 0.01 10*3/uL (ref 0.00–0.07)
Basophils Absolute: 0 10*3/uL (ref 0.0–0.1)
Basophils Relative: 0 %
Eosinophils Absolute: 0.1 10*3/uL (ref 0.0–0.5)
Eosinophils Relative: 1 %
HCT: 34.1 % — ABNORMAL LOW (ref 36.0–46.0)
Hemoglobin: 11.1 g/dL — ABNORMAL LOW (ref 12.0–15.0)
Immature Granulocytes: 0 %
Lymphocytes Relative: 34 %
Lymphs Abs: 1.9 10*3/uL (ref 0.7–4.0)
MCH: 27.6 pg (ref 26.0–34.0)
MCHC: 32.6 g/dL (ref 30.0–36.0)
MCV: 84.8 fL (ref 80.0–100.0)
Monocytes Absolute: 0.3 10*3/uL (ref 0.1–1.0)
Monocytes Relative: 6 %
Neutro Abs: 3.3 10*3/uL (ref 1.7–7.7)
Neutrophils Relative %: 59 %
Platelets: 181 10*3/uL (ref 150–400)
RBC: 4.02 MIL/uL (ref 3.87–5.11)
RDW: 15.8 % — ABNORMAL HIGH (ref 11.5–15.5)
WBC: 5.4 10*3/uL (ref 4.0–10.5)
nRBC: 0 % (ref 0.0–0.2)

## 2021-11-29 LAB — URINALYSIS, ROUTINE W REFLEX MICROSCOPIC
Bilirubin Urine: NEGATIVE
Glucose, UA: NEGATIVE mg/dL
Ketones, ur: NEGATIVE mg/dL
Nitrite: NEGATIVE
Protein, ur: NEGATIVE mg/dL
Specific Gravity, Urine: 1.002 — ABNORMAL LOW (ref 1.005–1.030)
pH: 6 (ref 5.0–8.0)

## 2021-11-29 LAB — COMPREHENSIVE METABOLIC PANEL
ALT: 27 U/L (ref 0–44)
AST: 60 U/L — ABNORMAL HIGH (ref 15–41)
Albumin: 3.7 g/dL (ref 3.5–5.0)
Alkaline Phosphatase: 149 U/L — ABNORMAL HIGH (ref 38–126)
Anion gap: 12 (ref 5–15)
BUN: <5 mg/dL — ABNORMAL LOW (ref 8–23)
CO2: 24 mmol/L (ref 22–32)
Calcium: 8.6 mg/dL — ABNORMAL LOW (ref 8.9–10.3)
Chloride: 97 mmol/L — ABNORMAL LOW (ref 98–111)
Creatinine, Ser: 0.49 mg/dL (ref 0.44–1.00)
GFR, Estimated: >60 mL/min (ref >60)
Glucose, Bld: 122 mg/dL — ABNORMAL HIGH (ref 70–99)
Potassium: 3.7 mmol/L (ref 3.5–5.1)
Sodium: 133 mmol/L — ABNORMAL LOW (ref 135–145)
Total Bilirubin: 0.6 mg/dL (ref 0.3–1.2)
Total Protein: 8.5 g/dL — ABNORMAL HIGH (ref 6.5–8.1)

## 2021-11-29 LAB — LACTIC ACID, PLASMA: Lactic Acid, Venous: 3.5 mmol/L (ref 0.5–1.9)

## 2021-11-29 LAB — TROPONIN I (HIGH SENSITIVITY)
Troponin I (High Sensitivity): 3 ng/L (ref <18)
Troponin I (High Sensitivity): 4 ng/L (ref <18)

## 2021-11-29 LAB — LIPASE, BLOOD: Lipase: 38 U/L (ref 11–51)

## 2021-11-29 LAB — AMMONIA: Ammonia: 47 umol/L — ABNORMAL HIGH (ref 9–35)

## 2021-11-29 MED ORDER — LIDOCAINE VISCOUS HCL 2 % MT SOLN
15.0000 mL | Freq: Once | OROMUCOSAL | Status: AC
  Administered 2021-11-29: 15 mL via ORAL
  Filled 2021-11-29: qty 15

## 2021-11-29 MED ORDER — FAMOTIDINE IN NACL 20-0.9 MG/50ML-% IV SOLN
20.0000 mg | Freq: Once | INTRAVENOUS | Status: DC
  Filled 2021-11-29: qty 50

## 2021-11-29 MED ORDER — ONDANSETRON 4 MG PO TBDP: 4.0000 mg | ORAL_TABLET | Freq: Three times a day (TID) | ORAL | 0 refills | Status: DC | PRN

## 2021-11-29 MED ORDER — ONDANSETRON 4 MG PO TBDP
4.0000 mg | ORAL_TABLET | Freq: Once | ORAL | Status: AC
  Administered 2021-11-29: 4 mg via ORAL
  Filled 2021-11-29: qty 1

## 2021-11-29 MED ORDER — SUCRALFATE 1 GM/10ML PO SUSP: 1.0000 g | Freq: Three times a day (TID) | ORAL | 0 refills | Status: DC

## 2021-11-29 MED ORDER — IOHEXOL 9 MG/ML PO SOLN
ORAL | Status: AC
  Filled 2021-11-29: qty 1000

## 2021-11-29 MED ORDER — CEPHALEXIN 500 MG PO CAPS: 500.0000 mg | ORAL_CAPSULE | Freq: Two times a day (BID) | ORAL | 0 refills | Status: DC

## 2021-11-29 MED ORDER — FAMOTIDINE 20 MG PO TABS
40.0000 mg | ORAL_TABLET | Freq: Once | ORAL | Status: AC
  Administered 2021-11-29: 40 mg via ORAL
  Filled 2021-11-29: qty 2

## 2021-11-29 MED ORDER — CEFTRIAXONE SODIUM 1 G IJ SOLR
1.0000 g | Freq: Once | INTRAMUSCULAR | Status: AC
  Administered 2021-11-29: 1 g via INTRAVENOUS
  Filled 2021-11-29: qty 10

## 2021-11-29 MED ORDER — ALUM & MAG HYDROXIDE-SIMETH 200-200-20 MG/5ML PO SUSP
30.0000 mL | Freq: Once | ORAL | Status: AC
Start: 2021-11-29 — End: 2021-11-29
  Administered 2021-11-29: 30 mL via ORAL
  Filled 2021-11-29: qty 30

## 2021-11-29 NOTE — ED Provider Notes (Signed)
[facility] EMERGENCY DEPARTMENT Provider Note   CSN: [id number] Arrival date & time: 11/28/21  2352     History  Chief Complaint  Patient presents with   Abdominal Pain    [name] is a 66 y.o. female.  Patient presents to the emergency department for evaluation of abdominal pain.  Patient reports moderate to severe upper abdominal pain after drinking multiple beers tonight.      Home Medications Prior to Admission medications   Medication Sig Start Date End Date Taking? Authorizing Provider  cephALEXin (KEFLEX) 500 MG capsule Take 1 capsule (500 mg total) by mouth 2 (two) times daily. 11/29/21  Yes Sloan Takagi, Gwenyth Allegra, MD  sucralfate (CARAFATE) 1 GM/10ML suspension Take 10 mLs (1 g total) by mouth 4 (four) times daily -  with meals and at bedtime. 11/29/21  Yes Divit Stipp, Gwenyth Allegra, MD  albuterol (PROVENTIL HFA;VENTOLIN HFA) 108 (90 BASE) MCG/ACT inhaler Inhale 2 puffs into the lungs every 6 (six) hours as needed for wheezing or shortness of breath.     [provider]  folic acid (FOLVITE) 081 MCG tablet Take 400 mcg by mouth daily.    [provider]  LINZESS 290 MCG CAPS capsule TAKE (1) CAPSULE BY MOUTH EVERY DAY. 10/26/21   Erenest Rasher, PA-C  lisinopril (ZESTRIL) 40 MG tablet Take 1 tablet (40 mg total) by mouth daily. Patient not taking: Reported on 11/18/2021 10/09/19   Roxan Hockey, MD  magnesium oxide (MAGOX 400) 400 (240 Mg) MG tablet Take 1 tablet (400 mg total) by mouth daily. 11/20/21 12/20/21  Barton Dubois, MD  Multiple Vitamin (MULTIVITAMIN WITH MINERALS) TABS tablet Take 1 tablet by mouth daily. 11/21/21   Barton Dubois, MD  pantoprazole (PROTONIX) 40 MG tablet Take 1 tablet (40 mg total) by mouth 2 (two) times daily. 11/20/21   Barton Dubois, MD  PHENobarbital (LUMINAL) 32.4 MG tablet Take 1 tablet (32.4 mg total) by mouth 2 (two) times daily. 11/20/21   Barton Dubois, MD  phenytoin (DILANTIN) 100 MG ER capsule Take 1 capsule (100  mg total) by mouth 3 (three) times daily. 11/20/21   Barton Dubois, MD  potassium chloride SA (KLOR-CON M) 20 MEQ tablet Take 1 tablet (20 mEq total) by mouth 2 (two) times daily. 11/20/21   Barton Dubois, MD  solifenacin (VESICARE) 5 MG tablet Take 5 mg by mouth daily. 03/11/20   [provider]  thiamine 100 MG tablet Take 1 tablet (100 mg total) by mouth daily. 11/21/21   Barton Dubois, MD      Allergies    Patient has no known allergies.    Review of Systems   Review of Systems  Gastrointestinal:  Positive for abdominal pain.   Physical Exam Updated Vital Signs BP (!) 171/88    Pulse (!) 113    Temp 97.9 F (36.6 C) (Oral)    Resp 17    Ht 5\' 4"  (1.626 m)    Wt 66 kg    SpO2 98%    BMI 24.98 kg/m  Physical Exam Vitals and nursing note reviewed.  Constitutional:      General: She is not in acute distress.    Appearance: She is well-developed.  HENT:     Head: Normocephalic and atraumatic.     Mouth/Throat:     Mouth: Mucous membranes are moist.  Eyes:     General: Vision grossly intact. Gaze aligned appropriately.     Extraocular Movements: Extraocular movements intact.  Conjunctiva/sclera: Conjunctivae normal.  Cardiovascular:     Rate and Rhythm: Normal rate and regular rhythm.     Pulses: Normal pulses.     Heart sounds: Normal heart sounds, S1 normal and S2 normal. No murmur heard.   No friction rub. No gallop.  Pulmonary:     Effort: Pulmonary effort is normal. No respiratory distress.     Breath sounds: Normal breath sounds.  Abdominal:     General: Bowel sounds are normal.     Palpations: Abdomen is soft.     Tenderness: There is generalized abdominal tenderness. There is no guarding or rebound.     Hernia: No hernia is present.  Musculoskeletal:        General: No swelling.     Cervical back: Full passive range of motion without pain, normal range of motion and neck supple. No spinous process tenderness or muscular tenderness. Normal range of motion.      Right lower leg: No edema.     Left lower leg: No edema.  Skin:    General: Skin is warm and dry.     Capillary Refill: Capillary refill takes less than 2 seconds.     Findings: No ecchymosis, erythema, rash or wound.  Neurological:     General: No focal deficit present.     Mental Status: She is alert and oriented to person, place, and time.     GCS: GCS eye subscore is 4. GCS verbal subscore is 5. GCS motor subscore is 6.     Cranial Nerves: Cranial nerves 2-12 are intact.     Sensory: Sensation is intact.     Motor: Motor function is intact.     Coordination: Coordination is intact.  Psychiatric:        Attention and Perception: Attention normal.        Mood and Affect: Mood normal.        Speech: Speech normal.        Behavior: Behavior normal.    ED Results / Procedures / Treatments   Labs (all labs ordered are listed, but only abnormal results are displayed) Labs Reviewed  CBC WITH DIFFERENTIAL/PLATELET - Abnormal; Notable for the following components:      Result Value   Hemoglobin 11.1 (*)    HCT 34.1 (*)    RDW 15.8 (*)    All other components within normal limits  COMPREHENSIVE METABOLIC PANEL - Abnormal; Notable for the following components:   Sodium 133 (*)    Chloride 97 (*)    Glucose, Bld 122 (*)    BUN <5 (*)    Calcium 8.6 (*)    Total Protein 8.5 (*)    AST 60 (*)    Alkaline Phosphatase 149 (*)    All other components within normal limits  LACTIC ACID, PLASMA - Abnormal; Notable for the following components:   Lactic Acid, Venous 3.5 (*)    All other components within normal limits  AMMONIA - Abnormal; Notable for the following components:   Ammonia 47 (*)    All other components within normal limits  URINALYSIS, ROUTINE W REFLEX MICROSCOPIC - Abnormal; Notable for the following components:   APPearance HAZY (*)    Specific Gravity, Urine 1.002 (*)    Hgb urine dipstick SMALL (*)    Leukocytes,Ua LARGE (*)    Bacteria, UA FEW (*)    All  other components within normal limits  URINE CULTURE  LIPASE, BLOOD  TROPONIN I (HIGH SENSITIVITY)  TROPONIN  I (HIGH SENSITIVITY)    EKG None  Radiology CT ABDOMEN PELVIS WO CONTRAST  Result Date: 11/29/2021 CLINICAL DATA:  Abdominal pain. EXAM: CT ABDOMEN AND PELVIS WITHOUT CONTRAST TECHNIQUE: Multidetector CT imaging of the abdomen and pelvis was performed following the standard protocol without IV contrast. RADIATION DOSE REDUCTION: This exam was performed according to the departmental dose-optimization program which includes automated exposure control, adjustment of the mA and/or kV according to patient size and/or use of iterative reconstruction technique. COMPARISON:  CT abdomen pelvis dated 10/17/2021. FINDINGS: Evaluation of this exam is limited in the absence of intravenous contrast. Lower chest: The visualized lung bases are clear. No intra-abdominal free air or free fluid. Hepatobiliary: There is irregularity of the liver contour consistent with cirrhosis. No intrahepatic biliary dilatation. The gallbladder is unremarkable. Pancreas: Unremarkable. No pancreatic ductal dilatation or surrounding inflammatory changes. Spleen: Normal in size without focal abnormality. Adrenals/Urinary Tract: The adrenal glands unremarkable. The kidneys, visualized ureters, and edema bladder appear unremarkable. Stomach/Bowel: There is sigmoid diverticulosis and scattered colonic diverticula without active inflammatory changes. There is a small hiatal hernia. There is no bowel obstruction or active inflammation. The appendix is normal. Vascular/Lymphatic: Mild atherosclerotic calcification of the aorta. The IVC is unremarkable. No portal venous gas. There is no adenopathy. Reproductive: Hysterectomy.  No adnexal masses. Other: None Musculoskeletal: Degenerative changes of the spine and left hip. No acute osseous pathology. IMPRESSION: 1. No acute intra-abdominal or pelvic pathology. 2. Colonic diverticulosis. No  bowel obstruction. Normal appendix. 3. Cirrhosis. 4. Aortic Atherosclerosis (ICD10-I70.0). Electronically Signed   By: Anner Crete M.D.   On: 11/29/2021 03:03   DG Chest Port 1 View  Result Date: 11/29/2021 CLINICAL DATA:  Chest pain. EXAM: PORTABLE CHEST 1 VIEW COMPARISON:  Chest radiograph dated 05/28/2021. FINDINGS: The heart size and mediastinal contours are within normal limits. Both lungs are clear. The visualized skeletal structures are unremarkable. IMPRESSION: No active disease. Electronically Signed   By: Anner Crete M.D.   On: 11/29/2021 00:55    Procedures Procedures    Medications Ordered in ED Medications  iohexol (OMNIPAQUE) 9 MG/ML oral solution (has no administration in time range)  famotidine (PEPCID) tablet 40 mg (has no administration in time range)  alum & mag hydroxide-simeth (MAALOX/MYLANTA) 200-200-20 MG/5ML suspension 30 mL (has no administration in time range)    And  lidocaine (XYLOCAINE) 2 % viscous mouth solution 15 mL (has no administration in time range)  cefTRIAXone (ROCEPHIN) 1 g in sodium chloride 0.9 % 100 mL IVPB (0 g Intravenous Stopped 11/29/21 0411)    ED Course/ Medical Decision Making/ A&P                           Medical Decision Making Amount and/or Complexity of Data Reviewed Labs: ordered. Radiology: ordered.  Risk Prescription drug management.   Patient presents to the emergency department for evaluation of abdominal pain.  Patient admits to drinking beer tonight.  She has a history of chronic alcoholism and liver cirrhosis.  Reviewing her records reveals that she did have an episode of diverticulitis couple of months ago.  Differential diagnosis would be gastritis, peptic ulcer disease, duodenitis, diverticulitis, small bowel obstruction  Patient's work-up has been reassuring.  Because of her recent diverticulitis, CT scan was repeated.  No evidence of acute abnormality noted.  The only finding on her work-up is evidence of  possible urinary tract infection.  This was treated with Rocephin and will be treated  outpatient with Keflex.  She is on Protonix already, add Carafate.        Final Clinical Impression(s) / ED Diagnoses Final diagnoses:  Acute gastritis without hemorrhage, unspecified gastritis type  Urinary tract infection without hematuria, site unspecified    Rx / DC Orders ED Discharge Orders          Ordered    sucralfate (CARAFATE) 1 GM/10ML suspension  3 times daily with meals & bedtime        11/29/21 0432    cephALEXin (KEFLEX) 500 MG capsule  2 times daily        11/29/21 0432              Orpah Greek, MD 11/29/21 [phone]

## 2021-11-29 NOTE — ED Provider Notes (Signed)
Patient with a big episode of vomiting right prior to discharge while EMS was here to pick her up.  Little bit of coffee-ground but not frank blood.  Patient still wants to go home.  We will give some ODT Zofran here.  And add a prescription for ODT Zofran for her to have at home.  Also switched her prescriptions to being printed.  Because [facility] is not open on Sundays.  Patient provided paper scripts so that she can have her friend go to one of the other pharmacies in town to pick up her prescriptions for today.   Fredia Sorrow, MD 11/29/21 [phone]

## 2021-11-29 NOTE — ED Notes (Signed)
[facility] notified of patient needing transportation home.

## 2021-12-01 ENCOUNTER — Ambulatory Visit: Payer: Medicare Other | Admitting: Orthopaedic Surgery

## 2021-12-04 LAB — URINE CULTURE: Culture: >=100000 — AB

## 2021-12-05 ENCOUNTER — Telehealth (HOSPITAL_BASED_OUTPATIENT_CLINIC_OR_DEPARTMENT_OTHER): Payer: Self-pay | Admitting: *Deleted

## 2021-12-05 NOTE — Telephone Encounter (Signed)
Post ED Visit - Positive Culture Follow-up  Culture report reviewed by antimicrobial stewardship pharmacist: [facility] Team []  Elenor Quinones, Pharm.D. []  Heide Guile, Pharm.D., BCPS AQ-ID []  Parks Neptune, Pharm.D., BCPS []  Alycia Rossetti, Pharm.D., BCPS []  [place], Pharm.D., BCPS, AAHIVP []  Legrand Como, Pharm.D., BCPS, AAHIVP []  Salome Arnt, PharmD, BCPS []  Johnnette Gourd, PharmD, BCPS []  Hughes Better, PharmD, BCPS []  Leeroy Cha, PharmD []  Laqueta Linden, PharmD, BCPS [x]  Albertina Parr, PharmD  [place] Team []  Leodis Sias, PharmD []  Lindell Spar, PharmD []  Royetta Asal, PharmD []  Graylin Shiver, Rph []  Rema Fendt) Glennon Mac, PharmD []  Arlyn Dunning, PharmD []  Netta Cedars, PharmD []  Dia Sitter, PharmD []  Leone Haven, PharmD []  Gretta Arab, PharmD []  Theodis Shove, PharmD []  Peggyann Juba, PharmD []  Reuel Boom, PharmD   Positive urine culture Treated with Cephalexin, organism sensitive to the same and no further patient follow-up is required at this time.  Rosie Fate 12/05/2021, 1:19 PM

## 2021-12-08 ENCOUNTER — Ambulatory Visit (INDEPENDENT_AMBULATORY_CARE_PROVIDER_SITE_OTHER): Payer: 59 | Admitting: Orthopaedic Surgery

## 2021-12-08 ENCOUNTER — Encounter: Payer: Self-pay | Admitting: Orthopaedic Surgery

## 2021-12-08 ENCOUNTER — Other Ambulatory Visit: Payer: Self-pay

## 2021-12-08 DIAGNOSIS — G8929 Other chronic pain: Secondary | ICD-10-CM

## 2021-12-08 DIAGNOSIS — M25561 Pain in right knee: Secondary | ICD-10-CM | POA: Diagnosis not present

## 2021-12-08 DIAGNOSIS — R531 Weakness: Secondary | ICD-10-CM

## 2021-12-08 NOTE — Progress Notes (Signed)
PROCEDURE NOTE:  The patient requests injections of the right knee , verbal consent was obtained.  The right knee was prepped appropriately after time out was performed.   Sterile technique was observed and injection of 1 cc of DepoMedrol 40mg  with several cc's of plain xylocaine. Anesthesia was provided by ethyl chloride and a 20-gauge needle was used to inject the knee area. The injection was tolerated well.  A band aid dressing was applied.  The patient was advised to apply ice later today and tomorrow to the injection sight as needed.  Encounter Diagnoses  Name Primary?   Chronic pain of right knee Yes   Right sided weakness    Return in two months.  Call if any problem.  Precautions discussed.  Electronically Signed Sanjuana Kava, MD 2/21/20239:02 AM

## 2021-12-09 ENCOUNTER — Telehealth: Payer: Self-pay

## 2021-12-09 NOTE — Telephone Encounter (Signed)
Patient states that you were going to send a prescription for Hydrocodone in yesterday for her.  PATIENT USES [facility]

## 2021-12-10 MED ORDER — HYDROCODONE-ACETAMINOPHEN 5-325 MG PO TABS
ORAL_TABLET | ORAL | 0 refills | Status: DC
Start: 2021-12-10 — End: 2022-02-04

## 2022-02-02 ENCOUNTER — Ambulatory Visit: Payer: 59 | Admitting: Orthopaedic Surgery

## 2022-02-04 ENCOUNTER — Ambulatory Visit (INDEPENDENT_AMBULATORY_CARE_PROVIDER_SITE_OTHER): Payer: 59 | Admitting: Orthopaedic Surgery

## 2022-02-04 ENCOUNTER — Encounter: Payer: Self-pay | Admitting: Orthopaedic Surgery

## 2022-02-04 DIAGNOSIS — M25561 Pain in right knee: Secondary | ICD-10-CM

## 2022-02-04 DIAGNOSIS — G8929 Other chronic pain: Secondary | ICD-10-CM | POA: Diagnosis not present

## 2022-02-04 DIAGNOSIS — R531 Weakness: Secondary | ICD-10-CM

## 2022-02-04 MED ORDER — HYDROCODONE-ACETAMINOPHEN 5-325 MG PO TABS: ORAL_TABLET | ORAL | 0 refills | Status: DC

## 2022-02-04 NOTE — Progress Notes (Signed)
PROCEDURE NOTE: ? ?The patient requests injections of the right knee , verbal consent was obtained. ? ?The right knee was prepped appropriately after time out was performed.  ? ?Sterile technique was observed and injection of 1 cc of DepoMedrol '40mg'$  with several cc's of plain xylocaine. Anesthesia was provided by ethyl chloride and a 20-gauge needle was used to inject the knee area. The injection was tolerated well.  A band aid dressing was applied. ? ?The patient was advised to apply ice later today and tomorrow to the injection sight as needed. ? ?Encounter Diagnoses  ?Name Primary?  ? Chronic pain of right knee Yes  ? Right sided weakness   ? ?I have refilled pain medicine. ? ?I have reviewed the [place] web site prior to prescribing narcotic medicine for this patient. ? ?She has lost some weight.  Weight was recorded today. ? ?Return in two months. ? ?Call if any problem. ? ?Precautions discussed. ? ?Electronically Signed ?Sanjuana Kava, MD ?4/20/20239:33 AM ? ?

## 2022-04-06 ENCOUNTER — Ambulatory Visit: Payer: 59 | Admitting: Orthopaedic Surgery

## 2022-04-27 ENCOUNTER — Ambulatory Visit (INDEPENDENT_AMBULATORY_CARE_PROVIDER_SITE_OTHER): Payer: 59 | Admitting: Orthopaedic Surgery

## 2022-04-27 ENCOUNTER — Encounter: Payer: Self-pay | Admitting: Orthopaedic Surgery

## 2022-04-27 DIAGNOSIS — G8929 Other chronic pain: Secondary | ICD-10-CM | POA: Diagnosis not present

## 2022-04-27 DIAGNOSIS — M25561 Pain in right knee: Secondary | ICD-10-CM

## 2022-04-27 DIAGNOSIS — R531 Weakness: Secondary | ICD-10-CM

## 2022-04-27 IMAGING — CT CT HEAD W/O CM
3 series · 16 of 47 positions shown, 19 images · non-contrast
Comparison: 07/30/2016

CLINICAL DATA: Slurred speech and right-sided numbness

EXAM:
CT HEAD WITHOUT CONTRAST
TECHNIQUE: Contiguous axial images were obtained from the base of the skull
through the vertex without intravenous contrast.

[Series 2: head w o · axial · 0.39mm/px · z∈[-23,+102]mm · 10 of 30 slices shown, 13 images]
[im 3/30  brain]
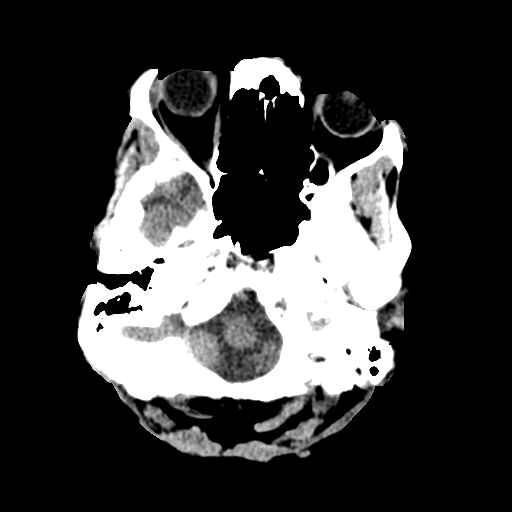
[im 3/30  bone]
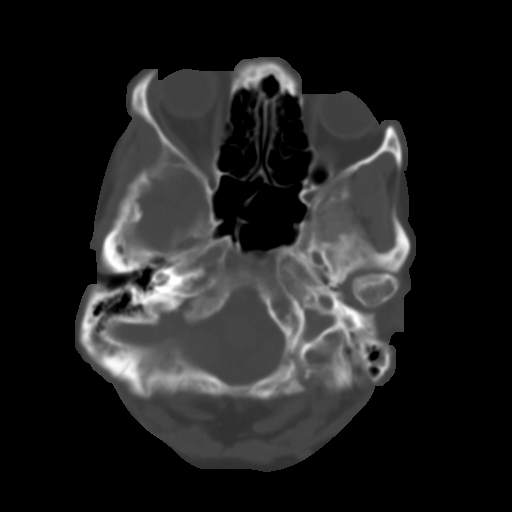
[im 6/30  brain]
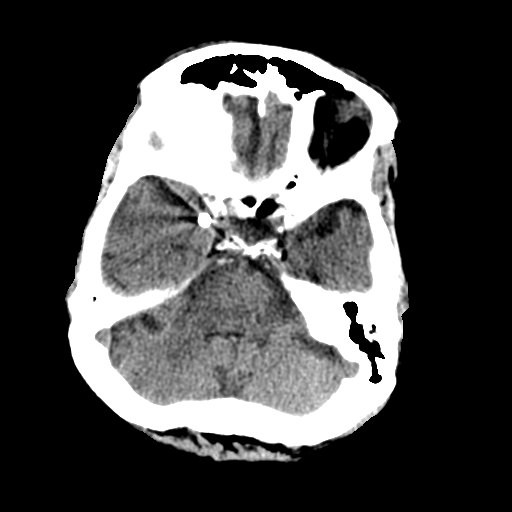
[im 9/30  brain]
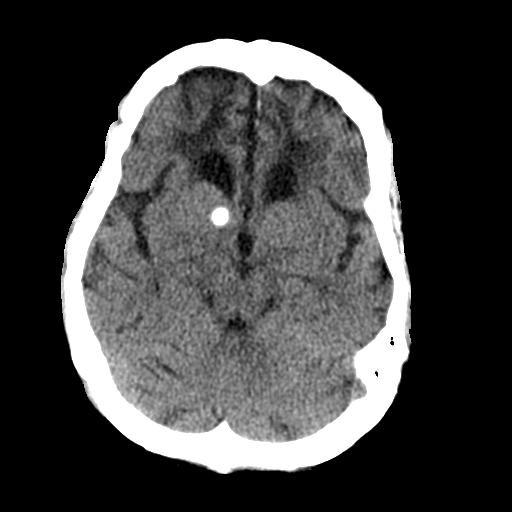
[im 11/30  brain]
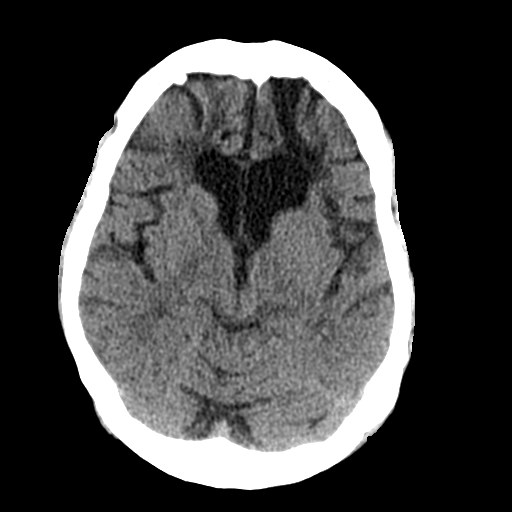
[im 14/30  brain]
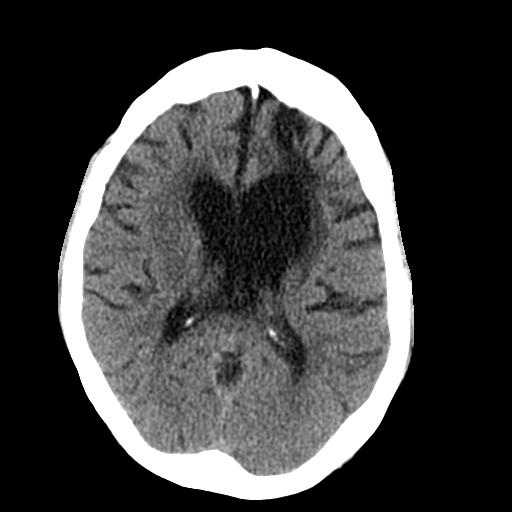
[im 14/30  bone]
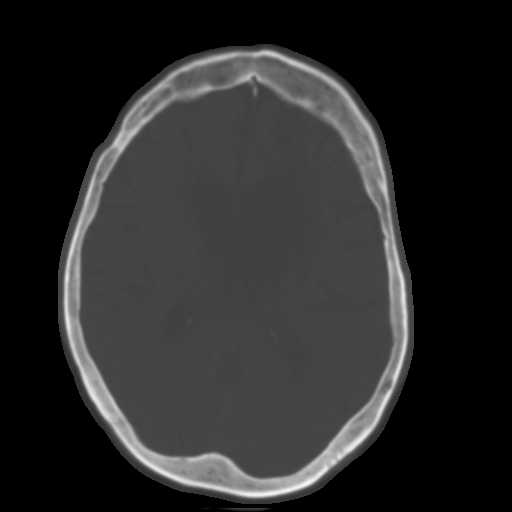
[im 17/30  brain]
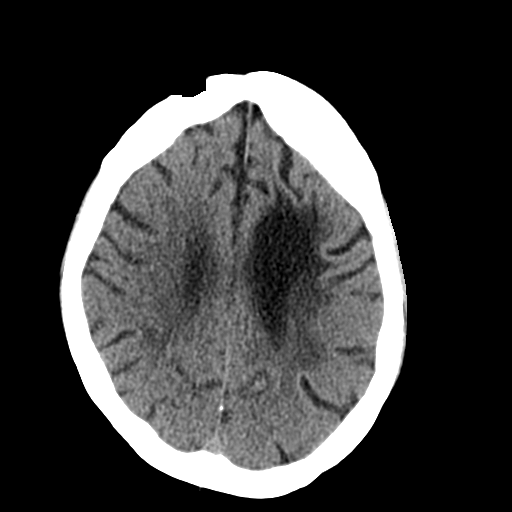
[im 20/30  brain]
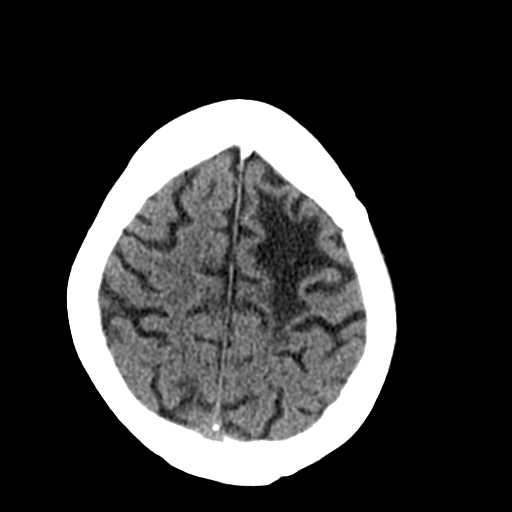
[im 23/30  brain]
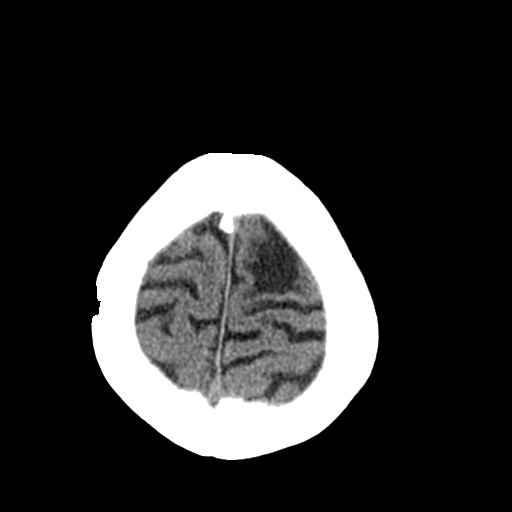
[im 25/30  brain]
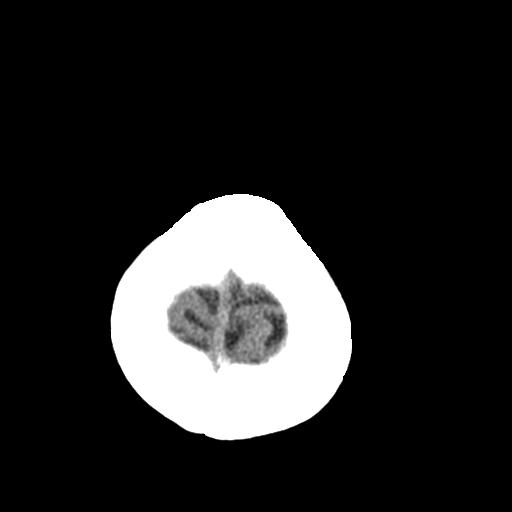
[im 25/30  bone]
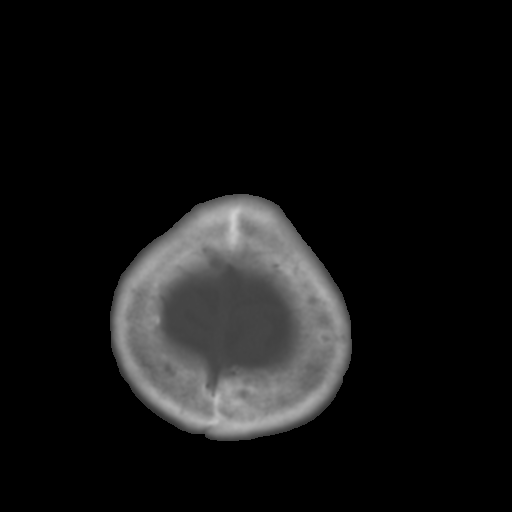
[im 28/30  brain]
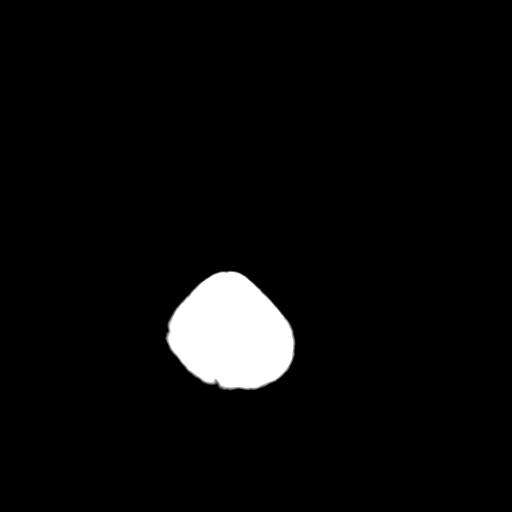

[Series 4: coronal soft · coronal · 0.30mm/px · 3 of 65 slices shown]
[im 22/65  brain]
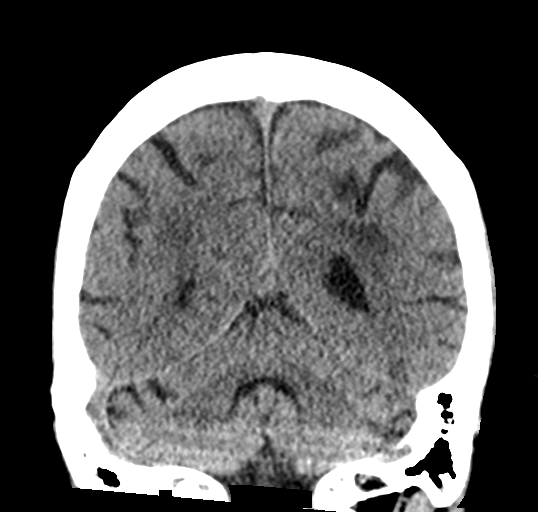
[im 29/65  brain]
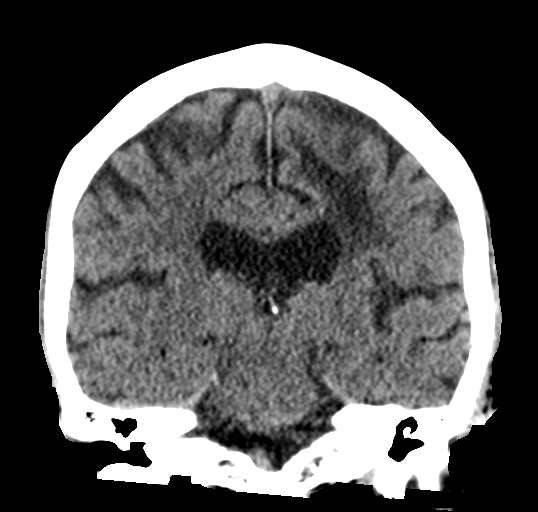
[im 36/65  brain]
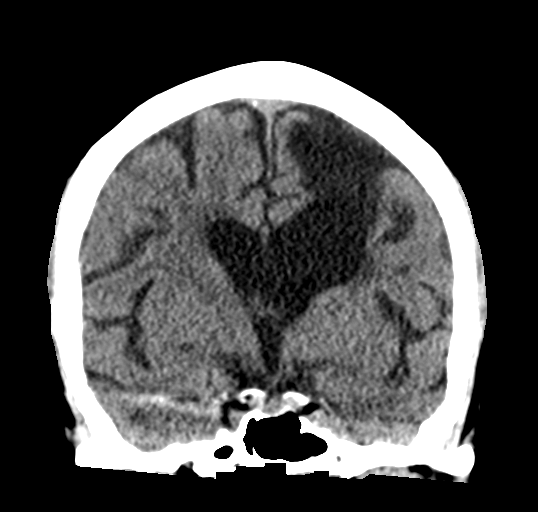

[Series 5: sagittal soft · sagittal · 0.33mm/px · 3 of 52 slices shown]
[im 18/52  brain]
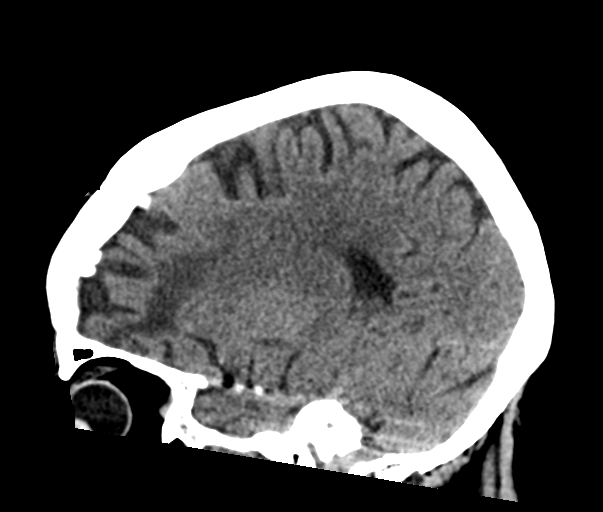
[im 26/52  brain]
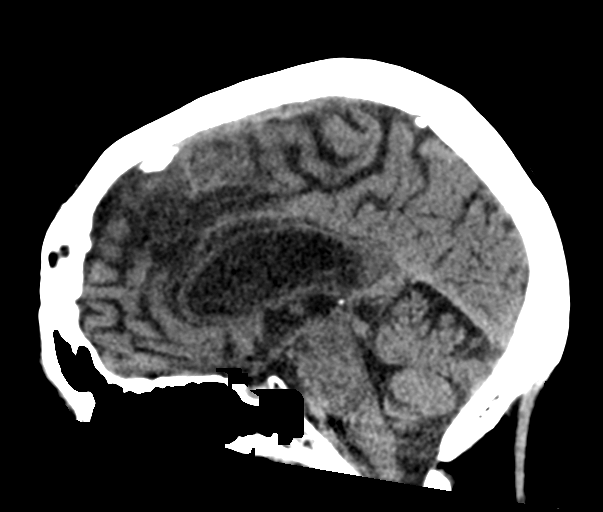
[im 35/52  brain]
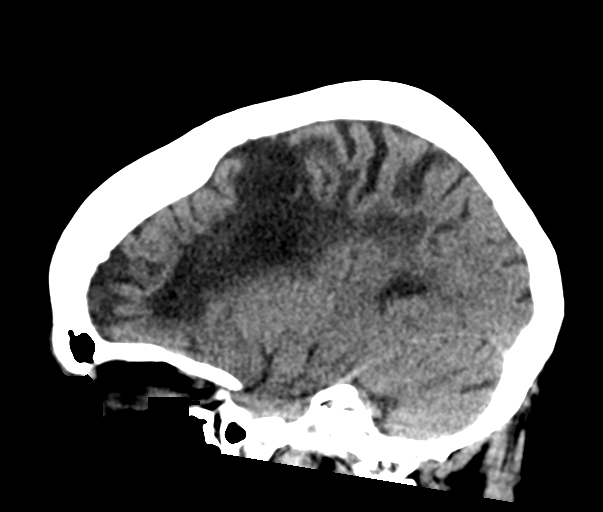

[16 of 47 positions shown; findings below may reference images not displayed]

FINDINGS: Brain: Bilateral inferior frontal and left superior frontal
encephalomalacia with ex vacuo enlargement. There has been aneurysm
clipping at the right circle[place]of-Willis with a calcified 10 mm mass
likely reflecting calcified aneurysm. No evidence of acute infarct,
acute hemorrhage, hydrocephalus, or shift.

Vascular: Negative

Skull: Remote right frontal craniotomy

Sinuses/Orbits: Negative
IMPRESSION: 1. No acute finding.
2. Bifrontal encephalomalacia, greater on the left.
3. Prior aneurysm clipping.

## 2022-04-27 MED ORDER — HYDROCODONE-ACETAMINOPHEN 5-325 MG PO TABS: ORAL_TABLET | ORAL | 0 refills | Status: DC

## 2022-04-27 NOTE — Progress Notes (Signed)
PROCEDURE NOTE:  The patient requests injections of the right knee , verbal consent was obtained.  The right knee was prepped appropriately after time out was performed.   Sterile technique was observed and injection of 1 cc of DepoMedrol '40mg'$  with several cc's of plain xylocaine. Anesthesia was provided by ethyl chloride and a 20-gauge needle was used to inject the knee area. The injection was tolerated well.  A band aid dressing was applied.  The patient was advised to apply ice later today and tomorrow to the injection sight as needed.  Encounter Diagnoses  Name Primary?   Chronic pain of right knee Yes   Right sided weakness    Return in two months.  I have reviewed the [place] web site prior to prescribing narcotic medicine for this patient.  Call if any problem.  Precautions discussed.  Electronically Signed Sanjuana Kava, MD 7/11/20239:55 AM

## 2022-05-06 ENCOUNTER — Encounter (HOSPITAL_COMMUNITY): Payer: Self-pay | Admitting: Emergency Medicine

## 2022-05-06 ENCOUNTER — Emergency Department (HOSPITAL_COMMUNITY)
Admission: EM | Admit: 2022-05-06 | Discharge: 2022-05-07 | Disposition: A | Discharge status: Home / Self Care | Payer: 59 | Attending: Emergency Medicine | Admitting: Emergency Medicine

## 2022-05-06 DIAGNOSIS — K7460 Unspecified cirrhosis of liver: Secondary | ICD-10-CM | POA: Diagnosis not present

## 2022-05-06 DIAGNOSIS — Z8719 Personal history of other diseases of the digestive system: Secondary | ICD-10-CM | POA: Diagnosis not present

## 2022-05-06 DIAGNOSIS — E876 Hypokalemia: Secondary | ICD-10-CM | POA: Insufficient documentation

## 2022-05-06 DIAGNOSIS — Y908 Blood alcohol level of 240 mg/100 ml or more: Secondary | ICD-10-CM | POA: Insufficient documentation

## 2022-05-06 DIAGNOSIS — E871 Hypo-osmolality and hyponatremia: Secondary | ICD-10-CM | POA: Insufficient documentation

## 2022-05-06 DIAGNOSIS — J45909 Unspecified asthma, uncomplicated: Secondary | ICD-10-CM | POA: Diagnosis not present

## 2022-05-06 DIAGNOSIS — F10929 Alcohol use, unspecified with intoxication, unspecified: Secondary | ICD-10-CM

## 2022-05-06 DIAGNOSIS — R1084 Generalized abdominal pain: Secondary | ICD-10-CM | POA: Diagnosis present

## 2022-05-06 DIAGNOSIS — Z87891 Personal history of nicotine dependence: Secondary | ICD-10-CM | POA: Insufficient documentation

## 2022-05-06 DIAGNOSIS — I10 Essential (primary) hypertension: Secondary | ICD-10-CM | POA: Insufficient documentation

## 2022-05-06 DIAGNOSIS — Z8711 Personal history of peptic ulcer disease: Secondary | ICD-10-CM | POA: Diagnosis not present

## 2022-05-06 DIAGNOSIS — K5730 Diverticulosis of large intestine without perforation or abscess without bleeding: Secondary | ICD-10-CM | POA: Insufficient documentation

## 2022-05-06 DIAGNOSIS — F10129 Alcohol abuse with intoxication, unspecified: Secondary | ICD-10-CM | POA: Insufficient documentation

## 2022-05-06 NOTE — ED Triage Notes (Signed)
Pt BIB [facility] c/o mid left abd pain that started tonight. Per pts family they have been drinking beer and fireball tonight.

## 2022-05-07 ENCOUNTER — Emergency Department (HOSPITAL_COMMUNITY): Payer: 59

## 2022-05-07 DIAGNOSIS — R1084 Generalized abdominal pain: Secondary | ICD-10-CM | POA: Diagnosis not present

## 2022-05-07 LAB — URINALYSIS, ROUTINE W REFLEX MICROSCOPIC
Bilirubin Urine: NEGATIVE
Glucose, UA: NEGATIVE mg/dL
Hgb urine dipstick: NEGATIVE
Ketones, ur: NEGATIVE mg/dL
Leukocytes,Ua: NEGATIVE
Nitrite: NEGATIVE
Protein, ur: NEGATIVE mg/dL
Specific Gravity, Urine: 1.001 — ABNORMAL LOW (ref 1.005–1.030)
pH: 6 (ref 5.0–8.0)

## 2022-05-07 LAB — CBC
HCT: 30.2 % — ABNORMAL LOW (ref 36.0–46.0)
Hemoglobin: 9.6 g/dL — ABNORMAL LOW (ref 12.0–15.0)
MCH: 26.2 pg (ref 26.0–34.0)
MCHC: 31.8 g/dL (ref 30.0–36.0)
MCV: 82.3 fL (ref 80.0–100.0)
Platelets: 145 10*3/uL — ABNORMAL LOW (ref 150–400)
RBC: 3.67 MIL/uL — ABNORMAL LOW (ref 3.87–5.11)
RDW: 18.8 % — ABNORMAL HIGH (ref 11.5–15.5)
WBC: 4.5 10*3/uL (ref 4.0–10.5)
nRBC: 0 % (ref 0.0–0.2)

## 2022-05-07 LAB — COMPREHENSIVE METABOLIC PANEL
ALT: 24 U/L (ref 0–44)
AST: 53 U/L — ABNORMAL HIGH (ref 15–41)
Albumin: 3.9 g/dL (ref 3.5–5.0)
Alkaline Phosphatase: 157 U/L — ABNORMAL HIGH (ref 38–126)
Anion gap: 11 (ref 5–15)
BUN: 6 mg/dL — ABNORMAL LOW (ref 8–23)
CO2: 18 mmol/L — ABNORMAL LOW (ref 22–32)
Calcium: 8.6 mg/dL — ABNORMAL LOW (ref 8.9–10.3)
Chloride: 101 mmol/L (ref 98–111)
Creatinine, Ser: 0.49 mg/dL (ref 0.44–1.00)
GFR, Estimated: >60 mL/min (ref >60)
Glucose, Bld: 94 mg/dL (ref 70–99)
Potassium: 2.9 mmol/L — ABNORMAL LOW (ref 3.5–5.1)
Sodium: 130 mmol/L — ABNORMAL LOW (ref 135–145)
Total Bilirubin: 0.5 mg/dL (ref 0.3–1.2)
Total Protein: 8.7 g/dL — ABNORMAL HIGH (ref 6.5–8.1)

## 2022-05-07 LAB — ETHANOL: Alcohol, Ethyl (B): 262 mg/dL — ABNORMAL HIGH (ref <10)

## 2022-05-07 LAB — LIPASE, BLOOD: Lipase: 40 U/L (ref 11–51)

## 2022-05-07 MED ORDER — POTASSIUM CHLORIDE CRYS ER 20 MEQ PO TBCR: 40.0000 meq | EXTENDED_RELEASE_TABLET | Freq: Once | ORAL | Status: DC

## 2022-05-07 MED ORDER — ALUM & MAG HYDROXIDE-SIMETH 200-200-20 MG/5ML PO SUSP
30.0000 mL | Freq: Once | ORAL | Status: AC
  Administered 2022-05-07: 30 mL via ORAL
  Filled 2022-05-07: qty 30

## 2022-05-07 MED ORDER — POTASSIUM CHLORIDE 20 MEQ PO PACK
40.0000 meq | PACK | Freq: Once | ORAL | Status: AC
  Administered 2022-05-07: 40 meq via ORAL
  Filled 2022-05-07: qty 2

## 2022-05-07 NOTE — ED Notes (Signed)
ED Provider at bedside. 

## 2022-05-07 NOTE — ED Provider Notes (Signed)
[facility] Emergency Department Provider Note MRN:  [id number]  Arrival date & time: 05/07/22     Chief Complaint   Abdominal Pain   History of Present Illness   [name] is a 65 y.o. year-old female with a history of stroke presenting to the ED with chief complaint of abdominal pain.  Diffuse abdominal pain after drinking this evening.  Denies chest pain.  Review of Systems  I was unable to obtain a full/accurate HPI, PMH, or ROS due to the patient's aphasia, intoxication.  Patient's Health History    Past Medical History:  Diagnosis Date   Alcohol abuse    Asthma    Contracture of muscle of hand    right   GERD (gastroesophageal reflux disease)    HTN (hypertension)    PUD (peptic ulcer disease)    remote   Seizures (HCC)    since stroke, no recent seizures   Sleep apnea    Stroke [facility])    age 67, required brain surgery effected right side    Past Surgical History:  Procedure Laterality Date   ABDOMINAL HYSTERECTOMY  2004   complete   BRAIN SURGERY     age 59 stroke   COLONOSCOPY     COLONOSCOPY WITH PROPOFOL  10/03/2012   Dr. [name]: moderate diverticulosis, small internal hemorrhoids, TUBULAR ADENOMA. Next colonoscopy 2023 per Dr. [name].   ESOPHAGOGASTRODUODENOSCOPY N/A 10/18/2014   Dr. Gala Romney during inpatient hospitalization: severe exudative/erosive reflux esophagitis as source of trivial upper GI bleed. No varices    ESOPHAGOGASTRODUODENOSCOPY (EGD) WITH PROPOFOL  10/03/2012   Dr. [name]: stricture at [place] junction s/p dilation, mild gastritis negative H.pylori    ESOPHAGOGASTRODUODENOSCOPY (EGD) WITH PROPOFOL N/A 12/27/2017   Grade D esophagitis. Medium-sized hiatal hernia, normal duodenum   right tib-fib fracture  2008   SAVORY DILATION  10/03/2012   Procedure: SAVORY DILATION;  Surgeon: Danie Binder, MD;  Location: [facility];  Service: Endoscopy;  Laterality: N/A;  started at 1303,  dilated 12.8-16mm    Family History   Problem Relation Age of Onset   Liver disease Sister        etoh   Colon cancer Neg Hx     Social History   Socioeconomic History   Marital status: Widowed    Spouse name: Not on file   Number of children: 3   Years of education: Not on file   Highest education level: Not on file  Occupational History   Occupation: disability    Employer: UNEMPLOYED  Tobacco Use   Smoking status: Former    Packs/day: 0.04    Years: 9.00    Total pack years: 0.36    Types: Cigarettes    Quit date: 07/28/2014    Years since quitting: 7.7   Smokeless tobacco: Never  Vaping Use   Vaping Use: Never used  Substance and Sexual Activity   Alcohol use: Yes    Alcohol/week: 35.0 standard drinks of alcohol    Types: 35 Cans of beer per week    Comment: Patient reports she drinks "a lot" of beer each day; 01/07/21 "1 or 2 at night"   Drug use: No   Sexual activity: Yes    Birth control/protection: Surgical  Other Topics Concern   Not on file  Social History Narrative   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 HAS 3 KIDS: ONE IN [place], ONE IN [place], AND ONE OOT.   Social Determinants of Health   Financial  Resource Strain: Not on file  Food Insecurity: Not on file  Transportation Needs: Not on file  Physical Activity: Not on file  Stress: Not on file  Social Connections: Not on file  Intimate Partner Violence: Not on file     Physical Exam   Vitals:   05/07/22 0200 05/07/22 0319  BP: 136/65 119/82  Pulse: 77 90  Resp: (!) 21 17  Temp:    SpO2: 100% 100%    CONSTITUTIONAL: Chronically ill-appearing, NAD NEURO/PSYCH:  Alert and oriented x 3, left-sided weakness/contractures EYES:  eyes equal and reactive ENT/NECK:  no LAD, no JVD CARDIO: Regular rate, well-perfused, normal S1 and S2 PULM:  CTAB no wheezing or rhonchi GI/GU:  non-distended, moderate diffuse tenderness MSK/SPINE:  No gross deformities, no edema SKIN:  no rash, atraumatic   *Additional and/or pertinent findings included  in MDM below  Diagnostic and Interventional Summary    EKG Interpretation  Date/Time:    Ventricular Rate:    PR Interval:    QRS Duration:   QT Interval:    QTC Calculation:   R Axis:     Text Interpretation:         Labs Reviewed  COMPREHENSIVE METABOLIC PANEL - Abnormal; Notable for the following components:      Result Value   Sodium 130 (*)    Potassium 2.9 (*)    CO2 18 (*)    BUN 6 (*)    Calcium 8.6 (*)    Total Protein 8.7 (*)    AST 53 (*)    Alkaline Phosphatase 157 (*)    All other components within normal limits  CBC - Abnormal; Notable for the following components:   RBC 3.67 (*)    Hemoglobin 9.6 (*)    HCT 30.2 (*)    RDW 18.8 (*)    Platelets 145 (*)    All other components within normal limits  URINALYSIS, ROUTINE W REFLEX MICROSCOPIC - Abnormal; Notable for the following components:   Color, Urine STRAW (*)    Specific Gravity, Urine 1.001 (*)    All other components within normal limits  ETHANOL - Abnormal; Notable for the following components:   Alcohol, Ethyl (B) 262 (*)    All other components within normal limits  LIPASE, BLOOD    CT ABDOMEN PELVIS WO CONTRAST  Final Result      Medications  potassium chloride SA (KLOR-CON M) CR tablet 40 mEq (has no administration in time range)  alum & mag hydroxide-simeth (MAALOX/MYLANTA) 200-200-20 MG/5ML suspension 30 mL (30 mLs Oral Given 05/07/22 0317)     Procedures  /  Critical Care Procedures  ED Course and Medical Decision Making  Initial Impression and Ddx Alcohol use this evening, abdominal pain, diffuse in nature, normal vital signs, suspect alcohol gastritis, given age and risk factors will obtain CT imaging.  Past medical/surgical history that increases complexity of ED encounter: Stroke  Interpretation of Diagnostics I personally reviewed the laboratory assessment and my interpretation is as follows: Hemoglobin 9.6, a bit lower than previous but has been this low before.  Again  noted is hyponatremia and hypokalemia, which seem to be chronic.  Alcohol level 260  CT imaging is without emergent process.  Patient Reassessment and Ultimate Disposition/Management     On reassessment patient is feeling better, vital signs remain normal, benign abdomen, appropriate for discharge.  Patient management required discussion with the following services or consulting groups:  None  Complexity of Problems Addressed Acute illness or  injury that poses threat of life of bodily function  Additional Data Reviewed and Analyzed Further history obtained from: Prior labs/imaging results  Additional Factors Impacting ED Encounter Risk Consideration of hospitalization  Barth Kirks. Sedonia Small, MD [place] [email or web address]  Final Clinical Impressions(s) / ED Diagnoses     ICD-10-CM   1. Generalized abdominal pain  R10.84     2. Alcoholic intoxication with complication ([place])  W23.762     3. Hypokalemia  E87.6       ED Discharge Orders     None        Discharge Instructions Discussed with and Provided to Patient:     Discharge Instructions      You were evaluated in the Emergency Department and after careful evaluation, we did not find any emergent condition requiring admission or further testing in the hospital.  Your exam/testing today is overall reassuring.  Please return to the Emergency Department if you experience any worsening of your condition.   Thank you for allowing [place] to be a part of your care.       Maudie Flakes, MD 05/07/22 0330

## 2022-05-07 NOTE — Discharge Instructions (Signed)
You were evaluated in the Emergency Department and after careful evaluation, we did not find any emergent condition requiring admission or further testing in the hospital.  Your exam/testing today is overall reassuring.  Please return to the Emergency Department if you experience any worsening of your condition.   Thank you for allowing us to be a part of your care. 

## 2022-05-12 ENCOUNTER — Other Ambulatory Visit (HOSPITAL_COMMUNITY): Payer: Self-pay | Admitting: Internal Medicine

## 2022-05-12 DIAGNOSIS — Z1231 Encounter for screening mammogram for malignant neoplasm of breast: Secondary | ICD-10-CM

## 2022-06-18 NOTE — Progress Notes (Signed)
Livana Yerian M. Elanna Bert, MD [facility] [email or web address]  

## 2022-06-25 ENCOUNTER — Ambulatory Visit (HOSPITAL_COMMUNITY): Payer: 59

## 2022-06-29 ENCOUNTER — Encounter: Payer: Self-pay | Admitting: Orthopaedic Surgery

## 2022-06-29 ENCOUNTER — Ambulatory Visit (INDEPENDENT_AMBULATORY_CARE_PROVIDER_SITE_OTHER): Payer: 59 | Admitting: Orthopaedic Surgery

## 2022-06-29 DIAGNOSIS — M25561 Pain in right knee: Secondary | ICD-10-CM | POA: Diagnosis not present

## 2022-06-29 DIAGNOSIS — G8929 Other chronic pain: Secondary | ICD-10-CM | POA: Diagnosis not present

## 2022-06-29 DIAGNOSIS — R531 Weakness: Secondary | ICD-10-CM

## 2022-06-29 MED ORDER — HYDROCODONE-ACETAMINOPHEN 5-325 MG PO TABS: ORAL_TABLET | ORAL | 0 refills | Status: DC

## 2022-06-29 MED ORDER — METHYLPREDNISOLONE ACETATE 40 MG/ML IJ SUSP: 40.0000 mg | Freq: Once | INTRAMUSCULAR | Status: DC

## 2022-06-29 NOTE — Addendum Note (Signed)
Addended by: Obie Dredge A on: 06/29/2022 11:11 AM   Modules accepted: Orders

## 2022-06-29 NOTE — Progress Notes (Signed)
PROCEDURE NOTE:  The patient requests injections of the right knee , verbal consent was obtained.  The right knee was prepped appropriately after time out was performed.   Sterile technique was observed and injection of 1 cc of DepoMedrol '40mg'$  with several cc's of plain xylocaine. Anesthesia was provided by ethyl chloride and a 20-gauge needle was used to inject the knee area. The injection was tolerated well.  A band aid dressing was applied.  The patient was advised to apply ice later today and tomorrow to the injection sight as needed.  Encounter Diagnoses  Name Primary?   Chronic pain of right knee Yes   Right sided weakness    I have reviewed the [place] web site prior to prescribing narcotic medicine for this patient.  Return in two months.  Call if any problem.  Precautions discussed.  Electronically Signed Sanjuana Kava, MD 9/12/20239:22 AM

## 2022-07-01 ENCOUNTER — Ambulatory Visit: Payer: 59 | Admitting: Internal Medicine

## 2022-08-03 ENCOUNTER — Other Ambulatory Visit: Payer: Self-pay

## 2022-08-03 ENCOUNTER — Encounter (HOSPITAL_COMMUNITY): Payer: Self-pay

## 2022-08-03 ENCOUNTER — Inpatient Hospital Stay (HOSPITAL_COMMUNITY)
Admission: EM | Admit: 2022-08-03 | Discharge: 2022-08-06 | DRG: 640 | Disposition: A | Discharge status: Home Health | Payer: 59 | Attending: Family Medicine | Admitting: Family Medicine

## 2022-08-03 DIAGNOSIS — K7460 Unspecified cirrhosis of liver: Secondary | ICD-10-CM | POA: Diagnosis present

## 2022-08-03 DIAGNOSIS — F1010 Alcohol abuse, uncomplicated: Secondary | ICD-10-CM | POA: Diagnosis present

## 2022-08-03 DIAGNOSIS — J45909 Unspecified asthma, uncomplicated: Secondary | ICD-10-CM | POA: Diagnosis present

## 2022-08-03 DIAGNOSIS — K2100 Gastro-esophageal reflux disease with esophagitis, without bleeding: Secondary | ICD-10-CM | POA: Diagnosis present

## 2022-08-03 DIAGNOSIS — I10 Essential (primary) hypertension: Secondary | ICD-10-CM | POA: Diagnosis present

## 2022-08-03 DIAGNOSIS — E860 Dehydration: Secondary | ICD-10-CM | POA: Diagnosis present

## 2022-08-03 DIAGNOSIS — G4730 Sleep apnea, unspecified: Secondary | ICD-10-CM | POA: Diagnosis present

## 2022-08-03 DIAGNOSIS — Z8673 Personal history of transient ischemic attack (TIA), and cerebral infarction without residual deficits: Secondary | ICD-10-CM

## 2022-08-03 DIAGNOSIS — Z8711 Personal history of peptic ulcer disease: Secondary | ICD-10-CM

## 2022-08-03 DIAGNOSIS — K219 Gastro-esophageal reflux disease without esophagitis: Secondary | ICD-10-CM | POA: Diagnosis present

## 2022-08-03 DIAGNOSIS — E871 Hypo-osmolality and hyponatremia: Secondary | ICD-10-CM | POA: Diagnosis present

## 2022-08-03 DIAGNOSIS — Z79899 Other long term (current) drug therapy: Secondary | ICD-10-CM

## 2022-08-03 DIAGNOSIS — K7010 Alcoholic hepatitis without ascites: Secondary | ICD-10-CM | POA: Diagnosis present

## 2022-08-03 DIAGNOSIS — D61818 Other pancytopenia: Secondary | ICD-10-CM | POA: Diagnosis present

## 2022-08-03 DIAGNOSIS — R7401 Elevation of levels of liver transaminase levels: Secondary | ICD-10-CM | POA: Diagnosis present

## 2022-08-03 DIAGNOSIS — E875 Hyperkalemia: Secondary | ICD-10-CM | POA: Diagnosis present

## 2022-08-03 DIAGNOSIS — Y908 Blood alcohol level of 240 mg/100 ml or more: Secondary | ICD-10-CM | POA: Diagnosis present

## 2022-08-03 DIAGNOSIS — Z87891 Personal history of nicotine dependence: Secondary | ICD-10-CM

## 2022-08-03 DIAGNOSIS — R109 Unspecified abdominal pain: Secondary | ICD-10-CM

## 2022-08-03 DIAGNOSIS — E876 Hypokalemia: Secondary | ICD-10-CM | POA: Diagnosis not present

## 2022-08-03 DIAGNOSIS — K589 Irritable bowel syndrome without diarrhea: Secondary | ICD-10-CM | POA: Diagnosis present

## 2022-08-03 DIAGNOSIS — G9389 Other specified disorders of brain: Secondary | ICD-10-CM | POA: Diagnosis present

## 2022-08-03 DIAGNOSIS — R748 Abnormal levels of other serum enzymes: Secondary | ICD-10-CM

## 2022-08-03 DIAGNOSIS — G9341 Metabolic encephalopathy: Secondary | ICD-10-CM | POA: Diagnosis present

## 2022-08-03 NOTE — ED Triage Notes (Signed)
Per EMS abdomen pt, pt confused. From home, AMS, weakness. Normally able to get around with a walker but no longer pcan do so.   EMS 120/73 HR 83 RR 12 100RA  Hx of stroke

## 2022-08-04 ENCOUNTER — Observation Stay (HOSPITAL_COMMUNITY): Payer: 59

## 2022-08-04 ENCOUNTER — Emergency Department (HOSPITAL_COMMUNITY): Payer: 59

## 2022-08-04 DIAGNOSIS — R7401 Elevation of levels of liver transaminase levels: Secondary | ICD-10-CM | POA: Diagnosis present

## 2022-08-04 LAB — LIPASE, BLOOD: Lipase: 29 U/L (ref 11–51)

## 2022-08-04 LAB — COMPREHENSIVE METABOLIC PANEL
ALT: 219 U/L — ABNORMAL HIGH (ref 0–44)
AST: 742 U/L — ABNORMAL HIGH (ref 15–41)
Albumin: 3.2 g/dL — ABNORMAL LOW (ref 3.5–5.0)
Alkaline Phosphatase: 119 U/L (ref 38–126)
Anion gap: 14 (ref 5–15)
BUN: <5 mg/dL — ABNORMAL LOW (ref 8–23)
CO2: 22 mmol/L (ref 22–32)
Calcium: 7.9 mg/dL — ABNORMAL LOW (ref 8.9–10.3)
Chloride: 85 mmol/L — ABNORMAL LOW (ref 98–111)
Creatinine, Ser: 0.53 mg/dL (ref 0.44–1.00)
GFR, Estimated: >60 mL/min (ref >60)
Glucose, Bld: 102 mg/dL — ABNORMAL HIGH (ref 70–99)
Potassium: 2.4 mmol/L — CL (ref 3.5–5.1)
Sodium: 121 mmol/L — ABNORMAL LOW (ref 135–145)
Total Bilirubin: 1.1 mg/dL (ref 0.3–1.2)
Total Protein: 7.2 g/dL (ref 6.5–8.1)

## 2022-08-04 LAB — CBC
HCT: 31 % — ABNORMAL LOW (ref 36.0–46.0)
Hemoglobin: 10.4 g/dL — ABNORMAL LOW (ref 12.0–15.0)
MCH: 28 pg (ref 26.0–34.0)
MCHC: 33.5 g/dL (ref 30.0–36.0)
MCV: 83.6 fL (ref 80.0–100.0)
Platelets: 130 10*3/uL — ABNORMAL LOW (ref 150–400)
RBC: 3.71 MIL/uL — ABNORMAL LOW (ref 3.87–5.11)
RDW: 17.5 % — ABNORMAL HIGH (ref 11.5–15.5)
WBC: 3.6 10*3/uL — ABNORMAL LOW (ref 4.0–10.5)
nRBC: 0 % (ref 0.0–0.2)

## 2022-08-04 LAB — URINALYSIS, ROUTINE W REFLEX MICROSCOPIC
Bilirubin Urine: NEGATIVE
Glucose, UA: NEGATIVE mg/dL
Ketones, ur: NEGATIVE mg/dL
Leukocytes,Ua: NEGATIVE
Nitrite: NEGATIVE
Protein, ur: NEGATIVE mg/dL
Specific Gravity, Urine: 1.005 (ref 1.005–1.030)
pH: 6 (ref 5.0–8.0)

## 2022-08-04 LAB — TROPONIN I (HIGH SENSITIVITY)
Troponin I (High Sensitivity): 7 ng/L (ref <18)
Troponin I (High Sensitivity): 6 ng/L (ref <18)

## 2022-08-04 LAB — ETHANOL: Alcohol, Ethyl (B): 256 mg/dL — ABNORMAL HIGH (ref <10)

## 2022-08-04 LAB — AMMONIA: Ammonia: 37 umol/L — ABNORMAL HIGH (ref 9–35)

## 2022-08-04 LAB — MAGNESIUM: Magnesium: 1.5 mg/dL — ABNORMAL LOW (ref 1.7–2.4)

## 2022-08-04 MED ORDER — LORAZEPAM 1 MG PO TABS: 1.0000 mg | ORAL_TABLET | ORAL | Status: DC | PRN

## 2022-08-04 MED ORDER — SODIUM CHLORIDE 0.9 % IV SOLN: INTRAVENOUS | Status: DC | PRN

## 2022-08-04 MED ORDER — LORAZEPAM 2 MG/ML IJ SOLN: 1.0000 mg | INTRAMUSCULAR | Status: DC | PRN

## 2022-08-04 MED ORDER — SODIUM CHLORIDE 0.9% FLUSH
3.0000 mL | Freq: Two times a day (BID) | INTRAVENOUS | Status: DC
  Administered 2022-08-05 – 2022-08-06 (×2): 3 mL via INTRAVENOUS

## 2022-08-04 MED ORDER — LACTULOSE 10 GM/15ML PO SOLN
10.0000 g | Freq: Every day | ORAL | Status: DC
  Administered 2022-08-04: 10 g via ORAL
  Filled 2022-08-04 (×2): qty 30

## 2022-08-04 MED ORDER — ACETAMINOPHEN 325 MG PO TABS: 650.0000 mg | ORAL_TABLET | Freq: Four times a day (QID) | ORAL | Status: DC | PRN

## 2022-08-04 MED ORDER — LACTATED RINGERS IV BOLUS
1000.0000 mL | Freq: Once | INTRAVENOUS | Status: AC
  Administered 2022-08-04: 1000 mL via INTRAVENOUS

## 2022-08-04 MED ORDER — FAMOTIDINE IN NACL 20-0.9 MG/50ML-% IV SOLN
20.0000 mg | Freq: Once | INTRAVENOUS | Status: AC
  Administered 2022-08-04: 20 mg via INTRAVENOUS
  Filled 2022-08-04: qty 50

## 2022-08-04 MED ORDER — ALBUTEROL SULFATE (2.5 MG/3ML) 0.083% IN NEBU: 2.5000 mg | INHALATION_SOLUTION | RESPIRATORY_TRACT | Status: DC | PRN

## 2022-08-04 MED ORDER — THIAMINE MONONITRATE 100 MG PO TABS
100.0000 mg | ORAL_TABLET | Freq: Every day | ORAL | Status: DC
  Administered 2022-08-04 – 2022-08-06 (×3): 100 mg via ORAL
  Filled 2022-08-04 (×3): qty 1

## 2022-08-04 MED ORDER — POTASSIUM CHLORIDE 10 MEQ/100ML IV SOLN
10.0000 meq | INTRAVENOUS | Status: AC
  Administered 2022-08-04 (×2): 10 meq via INTRAVENOUS
  Filled 2022-08-04 (×2): qty 100

## 2022-08-04 MED ORDER — SODIUM CHLORIDE 0.9% FLUSH: 3.0000 mL | INTRAVENOUS | Status: DC | PRN

## 2022-08-04 MED ORDER — ALUM & MAG HYDROXIDE-SIMETH 200-200-20 MG/5ML PO SUSP
30.0000 mL | Freq: Once | ORAL | Status: AC
  Administered 2022-08-04: 30 mL via ORAL
  Filled 2022-08-04: qty 30

## 2022-08-04 MED ORDER — IOHEXOL 300 MG/ML  SOLN
100.0000 mL | Freq: Once | INTRAMUSCULAR | Status: AC | PRN
  Administered 2022-08-04: 100 mL via INTRAVENOUS

## 2022-08-04 MED ORDER — ONDANSETRON HCL 4 MG/2ML IJ SOLN
4.0000 mg | Freq: Once | INTRAMUSCULAR | Status: AC
  Administered 2022-08-04: 4 mg via INTRAVENOUS
  Filled 2022-08-04: qty 2

## 2022-08-04 MED ORDER — PHENOBARBITAL 32.4 MG PO TABS
32.4000 mg | ORAL_TABLET | Freq: Two times a day (BID) | ORAL | Status: DC
  Administered 2022-08-04 – 2022-08-06 (×5): 32.4 mg via ORAL
  Filled 2022-08-04 (×5): qty 1

## 2022-08-04 MED ORDER — POTASSIUM CHLORIDE IN NACL 20-0.9 MEQ/L-% IV SOLN
INTRAVENOUS | Status: DC
  Filled 2022-08-04: qty 1000

## 2022-08-04 MED ORDER — PANTOPRAZOLE SODIUM 40 MG PO TBEC
40.0000 mg | DELAYED_RELEASE_TABLET | Freq: Every day | ORAL | Status: DC
  Administered 2022-08-04 – 2022-08-06 (×3): 40 mg via ORAL
  Filled 2022-08-04 (×3): qty 1

## 2022-08-04 MED ORDER — BISACODYL 10 MG RE SUPP: 10.0000 mg | Freq: Every day | RECTAL | Status: DC | PRN

## 2022-08-04 MED ORDER — POTASSIUM CHLORIDE 20 MEQ PO PACK
40.0000 meq | PACK | Freq: Two times a day (BID) | ORAL | Status: DC
  Administered 2022-08-04 (×3): 40 meq via ORAL
  Filled 2022-08-04 (×4): qty 2

## 2022-08-04 MED ORDER — ONDANSETRON HCL 4 MG PO TABS: 4.0000 mg | ORAL_TABLET | Freq: Four times a day (QID) | ORAL | Status: DC | PRN

## 2022-08-04 MED ORDER — SODIUM CHLORIDE 0.9% FLUSH
3.0000 mL | Freq: Two times a day (BID) | INTRAVENOUS | Status: DC
  Administered 2022-08-05 (×2): 3 mL via INTRAVENOUS

## 2022-08-04 MED ORDER — HEPARIN SODIUM (PORCINE) 5000 UNIT/ML IJ SOLN
5000.0000 [IU] | Freq: Three times a day (TID) | INTRAMUSCULAR | Status: DC
  Administered 2022-08-04 – 2022-08-06 (×7): 5000 [IU] via SUBCUTANEOUS
  Filled 2022-08-04 (×7): qty 1

## 2022-08-04 MED ORDER — POTASSIUM CHLORIDE CRYS ER 20 MEQ PO TBCR: 40.0000 meq | EXTENDED_RELEASE_TABLET | Freq: Once | ORAL | Status: DC

## 2022-08-04 MED ORDER — ACETAMINOPHEN 650 MG RE SUPP: 650.0000 mg | Freq: Four times a day (QID) | RECTAL | Status: DC | PRN

## 2022-08-04 MED ORDER — FOLIC ACID 1 MG PO TABS
1.0000 mg | ORAL_TABLET | Freq: Every day | ORAL | Status: DC
  Administered 2022-08-04 – 2022-08-06 (×3): 1 mg via ORAL
  Filled 2022-08-04 (×3): qty 1

## 2022-08-04 MED ORDER — PHENYTOIN SODIUM EXTENDED 100 MG PO CAPS
100.0000 mg | ORAL_CAPSULE | Freq: Three times a day (TID) | ORAL | Status: DC
  Administered 2022-08-04 (×3): 100 mg via ORAL
  Filled 2022-08-04 (×4): qty 1

## 2022-08-04 MED ORDER — MAGNESIUM SULFATE 4 GM/100ML IV SOLN
4.0000 g | Freq: Once | INTRAVENOUS | Status: AC
  Administered 2022-08-04: 4 g via INTRAVENOUS
  Filled 2022-08-04: qty 100

## 2022-08-04 MED ORDER — TRAZODONE HCL 50 MG PO TABS
50.0000 mg | ORAL_TABLET | Freq: Every evening | ORAL | Status: DC | PRN
  Administered 2022-08-04 – 2022-08-05 (×2): 50 mg via ORAL
  Filled 2022-08-04 (×2): qty 1

## 2022-08-04 MED ORDER — DIAZEPAM 2 MG PO TABS
2.0000 mg | ORAL_TABLET | Freq: Three times a day (TID) | ORAL | Status: AC
  Administered 2022-08-04 – 2022-08-06 (×6): 2 mg via ORAL
  Filled 2022-08-04 (×6): qty 1

## 2022-08-04 MED ORDER — POLYETHYLENE GLYCOL 3350 17 G PO PACK: 17.0000 g | PACK | Freq: Every day | ORAL | Status: DC | PRN

## 2022-08-04 MED ORDER — ONDANSETRON HCL 4 MG/2ML IJ SOLN: 4.0000 mg | Freq: Four times a day (QID) | INTRAMUSCULAR | Status: DC | PRN

## 2022-08-04 MED ORDER — THIAMINE HCL 100 MG/ML IJ SOLN: 100.0000 mg | Freq: Every day | INTRAMUSCULAR | Status: DC

## 2022-08-04 MED ORDER — ADULT MULTIVITAMIN W/MINERALS CH
1.0000 | ORAL_TABLET | Freq: Every day | ORAL | Status: DC
  Administered 2022-08-04 – 2022-08-06 (×3): 1 via ORAL
  Filled 2022-08-04 (×3): qty 1

## 2022-08-04 NOTE — ED Notes (Signed)
IV infiltrated and removed. EDP made aware and needed assistance with IV

## 2022-08-04 NOTE — ED Provider Notes (Signed)
[facility] Emergency Department Provider Note MRN:  [id number]  Arrival date & time: 08/04/22     Chief Complaint   Altered Mental Status and Abdominal Pain   History of Present Illness   [name] is a 66 y.o. year-old female with a history of stroke, seizure, alcohol use disorder presenting to the ED with chief complaint of confusion, abdominal pain.  Abdominal pain for the past 3 weeks, diffusely in the abdomen, getting worse.  Also confused occasionally, caregivers suspicious this is due to alcohol use.  No chest pain or shortness of breath, no burning with urination, no numbness or weakness to the arms or legs.  Review of Systems  A thorough review of systems was obtained and all systems are negative except as noted in the HPI and PMH.   Patient's Health History    Past Medical History:  Diagnosis Date   Alcohol abuse    Asthma    Contracture of muscle of hand    right   GERD (gastroesophageal reflux disease)    HTN (hypertension)    PUD (peptic ulcer disease)    remote   Seizures (HCC)    since stroke, no recent seizures   Sleep apnea    Stroke [facility])    age 20, required brain surgery effected right side    Past Surgical History:  Procedure Laterality Date   ABDOMINAL HYSTERECTOMY  2004   complete   BRAIN SURGERY     age 87 stroke   COLONOSCOPY     COLONOSCOPY WITH PROPOFOL  10/03/2012   Dr. [name]: moderate diverticulosis, small internal hemorrhoids, TUBULAR ADENOMA. Next colonoscopy 2023 per Dr. [name].   ESOPHAGOGASTRODUODENOSCOPY N/A 10/18/2014   Dr. Gala Romney during inpatient hospitalization: severe exudative/erosive reflux esophagitis as source of trivial upper GI bleed. No varices    ESOPHAGOGASTRODUODENOSCOPY (EGD) WITH PROPOFOL  10/03/2012   Dr. [name]: stricture at [place] junction s/p dilation, mild gastritis negative H.pylori    ESOPHAGOGASTRODUODENOSCOPY (EGD) WITH PROPOFOL N/A 12/27/2017   Grade D esophagitis. Medium-sized  hiatal hernia, normal duodenum   right tib-fib fracture  2008   SAVORY DILATION  10/03/2012   Procedure: SAVORY DILATION;  Surgeon: Danie Binder, MD;  Location: [facility];  Service: Endoscopy;  Laterality: N/A;  started at 1303,  dilated 12.8-16mm    Family History  Problem Relation Age of Onset   Liver disease Sister        etoh   Colon cancer Neg Hx     Social History   Socioeconomic History   Marital status: Widowed    Spouse name: Not on file   Number of children: 3   Years of education: Not on file   Highest education level: Not on file  Occupational History   Occupation: disability    Employer: UNEMPLOYED  Tobacco Use   Smoking status: Former    Packs/day: 0.04    Years: 9.00    Total pack years: 0.36    Types: Cigarettes    Quit date: 07/28/2014    Years since quitting: 8.0   Smokeless tobacco: Never  Vaping Use   Vaping Use: Never used  Substance and Sexual Activity   Alcohol use: Yes    Alcohol/week: 35.0 standard drinks of alcohol    Types: 35 Cans of beer per week    Comment: Patient reports she drinks "a lot" of beer each day; 01/07/21 "1 or 2 at night"   Drug use: No   Sexual activity: Yes  Birth control/protection: Surgical  Other Topics Concern   Not on file  Social History Narrative   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 HAS 3 KIDS: ONE IN [place], ONE IN [place], AND ONE OOT.   Social Determinants of Health   Financial Resource Strain: Not on file  Food Insecurity: Not on file  Transportation Needs: Not on file  Physical Activity: Not on file  Stress: Not on file  Social Connections: Not on file  Intimate Partner Violence: Not on file     Physical Exam   Vitals:   08/04/22 0230 08/04/22 0300  BP: 127/64 124/66  Pulse: 88 90  Resp: 16 13  Temp:    SpO2: 100% 98%    CONSTITUTIONAL: Chronically ill-appearing, NAD NEURO/PSYCH:  Alert, answers questions, oriented, moves all extremities EYES:  eyes equal and reactive ENT/NECK:  no LAD, no  JVD CARDIO: Regular rate, well-perfused, normal S1 and S2 PULM:  CTAB no wheezing or rhonchi GI/GU:  non-distended, non-tender MSK/SPINE:  No gross deformities, no edema SKIN:  no rash, atraumatic   *Additional and/or pertinent findings included in MDM below  Diagnostic and Interventional Summary    EKG Interpretation  Date/Time:  Wednesday August 04 2022 00:17:44 EDT Ventricular Rate:  84 PR Interval:  46 QRS Duration: 106 QT Interval:  413 QTC Calculation: 489 R Axis:   38 Text Interpretation: Sinus rhythm Short PR interval Right atrial enlargement Abnormal R-wave progression, early transition Borderline prolonged QT interval Artifact in lead(s) I II III aVR aVL Confirmed by Gerlene Fee [id number]) on 08/04/2022 1:45:59 AM       Labs Reviewed  CBC - Abnormal; Notable for the following components:      Result Value   WBC 3.6 (*)    RBC 3.71 (*)    Hemoglobin 10.4 (*)    HCT 31.0 (*)    RDW 17.5 (*)    Platelets 130 (*)    All other components within normal limits  COMPREHENSIVE METABOLIC PANEL - Abnormal; Notable for the following components:   Sodium 121 (*)    Potassium 2.4 (*)    Chloride 85 (*)    Glucose, Bld 102 (*)    BUN <5 (*)    Calcium 7.9 (*)    Albumin 3.2 (*)    AST 742 (*)    ALT 219 (*)    All other components within normal limits  URINALYSIS, ROUTINE W REFLEX MICROSCOPIC - Abnormal; Notable for the following components:   Hgb urine dipstick SMALL (*)    Bacteria, UA RARE (*)    All other components within normal limits  ETHANOL - Abnormal; Notable for the following components:   Alcohol, Ethyl (B) 256 (*)    All other components within normal limits  MAGNESIUM - Abnormal; Notable for the following components:   Magnesium 1.5 (*)    All other components within normal limits  LIPASE, BLOOD  TROPONIN I (HIGH SENSITIVITY)  TROPONIN I (HIGH SENSITIVITY)    CT HEAD WO CONTRAST (5MM)  Final Result    CT ABDOMEN PELVIS W CONTRAST  Final Result       Medications  potassium chloride 10 mEq in 100 mL IVPB (10 mEq Intravenous New Bag/Given 08/04/22 0245)  potassium chloride (KLOR-CON) packet 40 mEq (40 mEq Oral Given 08/04/22 0244)  famotidine (PEPCID) IVPB 20 mg premix (0 mg Intravenous Stopped 08/04/22 0230)  alum & mag hydroxide-simeth (MAALOX/MYLANTA) 200-200-20 MG/5ML suspension 30 mL (30 mLs Oral Given 08/04/22 0119)  ondansetron (ZOFRAN)  injection 4 mg (4 mg Intravenous Given 08/04/22 0155)  lactated ringers bolus 1,000 mL (1,000 mLs Intravenous New Bag/Given 08/04/22 0229)  iohexol (OMNIPAQUE) 300 MG/ML solution 100 mL (100 mLs Intravenous Contrast Given 08/04/22 0157)     Procedures  /  Critical Care .Critical Care  Performed by: Maudie Flakes, MD Authorized by: Maudie Flakes, MD   Critical care provider statement:    Critical care time (minutes):  35   Critical care was necessary to treat or prevent imminent or life-threatening deterioration of the following conditions:  Metabolic crisis (critical hypokalemia)   Critical care was time spent personally by me on the following activities:  Development of treatment plan with patient or surrogate, discussions with consultants, evaluation of patient's response to treatment, examination of patient, ordering and review of laboratory studies, ordering and review of radiographic studies, ordering and performing treatments and interventions, pulse oximetry, re-evaluation of patient's condition and review of old charts   ED Course and Medical Decision Making  Initial Impression and Ddx Per caregiver patient is largely at her cognitive baseline at this time, a bit slow to answer questions currently, possibly due to alcohol.  Alcohol gastritis is a consideration given the abdominal pain and alcohol use, also considering pancreatitis, perforated viscus, appendicitis, diverticulitis.  Past medical/surgical history that increases complexity of ED encounter: Alcohol use  disorder  Interpretation of Diagnostics I personally reviewed the EKG and my interpretation is as follows: Sinus rhythm, no significant change from prior  Labs notable for potassium of 2.4, liver enzymes elevated with AST 740, ALT greater than 200.  Otherwise no significant blood count or electrolyte disturbance.  CT abdomen is without acute process.  Patient Reassessment and Ultimate Disposition/Management     Given the hypokalemia and LFT elevation, continued pain with some continued nausea.  Will admit to medicine.  Patient management required discussion with the following services or consulting groups:  Hospitalist Service  Complexity of Problems Addressed Acute illness or injury that poses threat of life of bodily function  Additional Data Reviewed and Analyzed Further history obtained from: Further history from spouse/family member  Additional Factors Impacting ED Encounter Risk Consideration of hospitalization  Barth Kirks. [place] Small, [place] [email or web address]  Final Clinical Impressions(s) / ED Diagnoses     ICD-10-CM   1. Hypokalemia  E87.6     2. Abdominal pain, unspecified abdominal location  R10.9     3. Elevated liver enzymes  R74.8       ED Discharge Orders     None        Discharge Instructions Discussed with and Provided to Patient:   Discharge Instructions   None      Maudie Flakes, MD 08/04/22 [phone]

## 2022-08-04 NOTE — H&P (Signed)
Patient Demographics:    [name], is a 66 y.o. female  MRN: [id number]   DOB - 1956/02/10  Admit Date - 08/03/2022  Outpatient Primary MD for the patient is Fanta, Normajean Baxter, MD   Assessment & Plan:   Assessment and Plan:  1)Acute metabolic encephalopathy--- multifactorial... In the setting of EtOH abuse and hyponatremia as well as liver dysfunction - 2)Hyponatremia--- sodium is down to 121-suspect this is related to beer potomania and dehydration --As per caregiver patient will continue to drink beer----Patient admits to drinking 6-8 beers some days -Hydrate orally and IV as ordered  3)Hypokalemia--- potassium is down to 2.4 patient denies diarrhea or vomiting but oral intake has been poor -Replace and recheck   4)Alcoholic hepatitis--- high risk for DTs -AST to ALT ratio is consistent with EtOH hepatitis -AST 742 ALT 219 -CT abdomen and pelvis and right upper quadrant ultrasound consistent liver cirrhosis -Acute viral hepatitis profile is pending -Ammonia actually mildly elevated -Lactulose as ordered Lorazepam per CIWA protocol -Multivitamins ordered  5)Anemia and Thrombocytopenia----  Hgb 10.4--- which is close to prior baseline -Platelets is 130 -Related to underlying liver cirrhosis -No bleeding concerns at this time  6)long standing h/o seizures----no history of recent seizures -Continue phenobarbital and phenytoin -Check phenytoin level to exclude possibility of phenytoin toxicity contributing to gait disturbance  7)H/o Stroke at age 50 (required craniotomy),--- weakness gait disturbance most likely related to low sodium and alcohol abuse rather than acute stroke --CT head without new acute findings however there is evidence of Atrophy, small-vessel disease and bifrontal/left temporal  encephalomalacia, Previous right-sided healed craniotomy with right parasellar aneurysm clip.  8)GERD/PUD--- no bleeding concerns at this time continue PPI  9)Asthma--- continue bronchodilators as needed  Disposition/Need for in-Hospital Stay- patient unable to be discharged at this time due to delicate fluid balance and gait disturbance in setting of severe hyponatremia, dehydration alcohol abuse  Status is: Inpatient  Remains inpatient appropriate because:   Dispo: The patient is from: Home              Anticipated d/c is to: Home              Anticipated d/c date is: 3 days              Patient currently is not medically stable to d/c. Barriers: Not Clinically Stable-    With History of - Reviewed by me  Past Medical History:  Diagnosis Date   Alcohol abuse    Asthma    Contracture of muscle of hand    right   GERD (gastroesophageal reflux disease)    HTN (hypertension)    PUD (peptic ulcer disease)    remote   Seizures (HCC)    since stroke, no recent seizures   Sleep apnea    Stroke [facility])    age 5, required brain surgery effected right side      Past Surgical History:  Procedure Laterality Date  ABDOMINAL HYSTERECTOMY  2004   complete   BRAIN SURGERY     age 29 stroke   COLONOSCOPY     COLONOSCOPY WITH PROPOFOL  10/03/2012   Dr. [name]: moderate diverticulosis, small internal hemorrhoids, TUBULAR ADENOMA. Next colonoscopy 2023 per Dr. [name].   ESOPHAGOGASTRODUODENOSCOPY N/A 10/18/2014   Dr. Gala Romney during inpatient hospitalization: severe exudative/erosive reflux esophagitis as source of trivial upper GI bleed. No varices    ESOPHAGOGASTRODUODENOSCOPY (EGD) WITH PROPOFOL  10/03/2012   Dr. [name]: stricture at [place] junction s/p dilation, mild gastritis negative H.pylori    ESOPHAGOGASTRODUODENOSCOPY (EGD) WITH PROPOFOL N/A 12/27/2017   Grade D esophagitis. Medium-sized hiatal hernia, normal duodenum   right tib-fib fracture  2008   SAVORY DILATION  10/03/2012    Procedure: SAVORY DILATION;  Surgeon: Danie Binder, MD;  Location: [facility];  Service: Endoscopy;  Laterality: N/A;  started at 1303,  dilated 12.8-16mm    Chief Complaint  Patient presents with   Altered Mental Status   Abdominal Pain      HPI:    [name]  is a 66 y.o. female  with PMHx relevant for h/o IBS, asthma, h/o Stroke at age 83 (required craniotomy), long standing h/o seizures, GERD/PUD and chronic alcohol abuse presents to the ED with increasing confusion and disorientation for the last 3 weeks associated with generalized weakness and abdominal discomfort -As per caregiver patient will continue to drink beer----Patient admits to drinking 6-8 beers some days No fever  Or chills  She has nausea but no vomiting or diarrhea -No seizures reported -CT head without new acute findings however there is evidence of Atrophy, small-vessel disease and bifrontal/left temporal encephalomalacia, Previous right-sided healed craniotomy with right parasellar aneurysm clip. -CT abdomen and pelvis with contrast shows no acute intracranial abnormalities but there is evidence of liver cirrhosis -Right upper quadrant ultrasound consistent with liver cirrhosis without other acute findings -Serum ammonia is 37 -Viral hepatitis profile pending -Serum ammonia is elevated at 37 -WBC 3.6 Hgb 10.4--- which is close to prior baseline -Platelets is 130 -Sodium down to 121 potassium 2.4 -AST 742 ALT 219 -Creatinine 0.53   Review of systems:    In addition to the HPI above,   A full Review of  Systems was done, all other systems reviewed are negative except as noted above in HPI , .    Social History:  Reviewed by me    Social History   Tobacco Use   Smoking status: Former    Packs/day: 0.04    Years: 9.00    Total pack years: 0.36    Types: Cigarettes    Quit date: 07/28/2014    Years since quitting: 8.0   Smokeless tobacco: Never  Substance Use Topics   Alcohol use: Yes     Alcohol/week: 35.0 standard drinks of alcohol    Types: 35 Cans of beer per week    Comment: Patient reports she drinks "a lot" of beer each day; 01/07/21 "1 or 2 at night"       Family History :  Reviewed by me    Family History  Problem Relation Age of Onset   Liver disease Sister        etoh   Colon cancer Neg Hx     Home Medications:   Prior to Admission medications   Medication Sig Start Date End Date Taking? Authorizing Provider  albuterol (PROVENTIL HFA;VENTOLIN HFA) 108 (90 BASE) MCG/ACT inhaler Inhale 2 puffs into the lungs every 6 (six)  hours as needed for wheezing or shortness of breath.    Yes [provider]  folic acid (FOLVITE) 161 MCG tablet Take 400 mcg by mouth daily.   Yes [provider]  HYDROcodone-acetaminophen (NORCO/VICODIN) 5-325 MG tablet One tablet every six hours for pain.  Limit 7 days. 06/29/22  Yes Sanjuana Kava, MD  LINZESS 290 MCG CAPS capsule TAKE (1) CAPSULE BY MOUTH EVERY DAY. 10/26/21  Yes Erenest Rasher, PA-C  Multiple Vitamin (MULTIVITAMIN WITH MINERALS) TABS tablet Take 1 tablet by mouth daily. 11/21/21  Yes Barton Dubois, MD  pantoprazole (PROTONIX) 40 MG tablet Take 1 tablet (40 mg total) by mouth 2 (two) times daily. 11/20/21  Yes Barton Dubois, MD  PHENobarbital (LUMINAL) 32.4 MG tablet Take 1 tablet (32.4 mg total) by mouth 2 (two) times daily. 11/20/21  Yes Barton Dubois, MD  phenytoin (DILANTIN) 100 MG ER capsule Take 1 capsule (100 mg total) by mouth 3 (three) times daily. 11/20/21  Yes Barton Dubois, MD  lisinopril (ZESTRIL) 40 MG tablet Take 1 tablet (40 mg total) by mouth daily. Patient not taking: Reported on 08/04/2022 10/09/19   Roxan Hockey, MD  ondansetron (ZOFRAN-ODT) 4 MG disintegrating tablet Take 1 tablet (4 mg total) by mouth every 8 (eight) hours as needed for nausea or vomiting. Patient not taking: Reported on 08/04/2022 11/29/21   Fredia Sorrow, MD  potassium chloride SA (KLOR-CON M) 20 MEQ tablet  Take 1 tablet (20 mEq total) by mouth 2 (two) times daily. Patient not taking: Reported on 08/04/2022 11/20/21   Barton Dubois, MD  sucralfate (CARAFATE) 1 GM/10ML suspension Take 10 mLs (1 g total) by mouth 4 (four) times daily -  with meals and at bedtime. Patient not taking: Reported on 08/04/2022 11/29/21   Fredia Sorrow, MD  thiamine 100 MG tablet Take 1 tablet (100 mg total) by mouth daily. Patient not taking: Reported on 08/04/2022 11/21/21   Barton Dubois, MD     Allergies:    No Known Allergies   Physical Exam:   Vitals  Blood pressure (!) 146/56, pulse 87, temperature 98 F (36.7 C), resp. rate 19, height '5\' 4"'$  (1.626 m), weight 62.9 kg, SpO2 100 %.  Physical Examination: General appearance -somewhat sleepy,  in no distress Mental status - alert, oriented to person, place, and time, Eyes - sclera anicteric Neck - supple, no JVD elevation , Chest - clear  to auscultation bilaterally, symmetrical air movement,  Heart - S1 and S2 normal, regular  Abdomen - soft, nontender, nondistended, +BS Neurological -residual right-sided hemiparesis with contracture of the right upper extremity/wrist and hand -No additional new acute focal deficits at this time, patient is somewhat sleepy Extremities - no pedal edema noted, intact peripheral pulses  Skin - warm, dry    Data Review:    CBC Recent Labs  Lab 08/04/22 0051  WBC 3.6*  HGB 10.4*  HCT 31.0*  PLT 130*  MCV 83.6  MCH 28.0  MCHC 33.5  RDW 17.5*   ------------------------------------------------------------------------------------------------------------------  Chemistries  Recent Labs  Lab 08/04/22 0051  NA 121*  K 2.4*  CL 85*  CO2 22  GLUCOSE 102*  BUN <5*  CREATININE 0.53  CALCIUM 7.9*  MG 1.5*  AST 742*  ALT 219*  ALKPHOS 119  BILITOT 1.1   ------------------------------------------------------------------------------------------------------------------ estimated creatinine clearance is 60.5  mL/min (by C-G formula based on SCr of 0.53 mg/dL). ------------------------------------------------------------------------------------------------------------------ ---------------------------------------------------------------------------------------------------------------  Urinalysis    Component Value Date/Time   COLORURINE YELLOW 08/04/2022 0233  APPEARANCEUR CLEAR 08/04/2022 0233   LABSPEC 1.005 08/04/2022 0233   PHURINE 6.0 08/04/2022 0233   GLUCOSEU NEGATIVE 08/04/2022 0233   HGBUR SMALL (A) 08/04/2022 0233   BILIRUBINUR NEGATIVE 08/04/2022 0233   KETONESUR NEGATIVE 08/04/2022 0233   PROTEINUR NEGATIVE 08/04/2022 0233   UROBILINOGEN 0.2 12/26/2014 1927   NITRITE NEGATIVE 08/04/2022 0233   LEUKOCYTESUR NEGATIVE 08/04/2022 0233    ----------------------------------------------------------------------------------------------------------------   Imaging Results:    US Abdomen Limited RUQ (LIVER/GB)  Result Date: 08/04/2022 CLINICAL DATA:  Right upper quadrant abdominal pain EXAM: ULTRASOUND ABDOMEN LIMITED RIGHT UPPER QUADRANT COMPARISON:  CT abdomen pelvis 05/07/2022 FINDINGS: Gallbladder: Gallstones: None Sludge: None Gallbladder Wall: Within normal limits Pericholecystic fluid: None Sonographic Murphy's Sign: Negative per technologist Common bile duct: Diameter: 2 mm Liver: Parenchymal echogenicity: Diffusely increased Contours: Nodular Lesions: None Portal vein: Patent.  Hepatopetal flow Other: None. IMPRESSION: 1. Cirrhotic liver morphology. No focal hepatic lesion. 2. No acute abnormality of the liver or gallbladder. Electronically Signed   By: Miachel Roux M.D.   On: 08/04/2022 09:18   CT HEAD WO CONTRAST (5MM)  Result Date: 08/04/2022 CLINICAL DATA:  Delirium, confusion and altered mentation with weakness. Loss of ambulatory status. EXAM: CT HEAD WITHOUT CONTRAST TECHNIQUE: Contiguous axial images were obtained from the base of the skull through the vertex without  intravenous contrast. RADIATION DOSE REDUCTION: This exam was performed according to the departmental dose-optimization program which includes automated exposure control, adjustment of the mA and/or kV according to patient size and/or use of iterative reconstruction technique. COMPARISON:  Head CT 10/17/2021 FINDINGS: Brain: There is chronic encephalomalacia in the left-greater-than-right frontal lobes with chronic infarcts, on the left partially extending into the upper temporal area. There is mild-to-moderate atrophy, small-vessel disease and atrophic/ex vacuo ventriculomegaly, greater in the left frontal horn as before but without midline shift. The cerebellum and brainstem appear relatively normal. There is an old right parasellar aneurysm clip again noted and a 10 mm rounded calcified mass just above it which is probably a calcified aneurysm. Vascular: As above. In addition there are scattered calcifications in the siphons but no hyperdense central vessels. Skull: Negative for fractures or focal lesions. There are chronic healed right calvarial craniotomy changes. There is a chronic healed fracture deformity of the left zygoma. Sinuses/Orbits: No acute abnormality. Other: No mastoid effusion. Nasal septum deviated right with chronic nasal bone fracture. IMPRESSION: 1. No acute intracranial CT findings or interval changes. 2. Atrophy, small-vessel disease and bifrontal/left temporal encephalomalacia. 3. 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 Electronically Signed   By: Telford Nab M.D.   On: 08/04/2022 02:27   CT ABDOMEN PELVIS W CONTRAST  Result Date: 08/04/2022 CLINICAL DATA:  Acute nonlocalized abdominal pain EXAM: CT ABDOMEN AND PELVIS WITH CONTRAST TECHNIQUE: Multidetector CT imaging of the abdomen and pelvis was performed using the standard protocol following bolus administration of intravenous contrast. RADIATION DOSE REDUCTION: This exam was performed  according to the departmental dose-optimization program which includes automated exposure control, adjustment of the mA and/or kV according to patient size and/or use of iterative reconstruction technique. CONTRAST:  154m OMNIPAQUE IOHEXOL 300 MG/ML  SOLN COMPARISON:  CT 05/07/2022 FINDINGS: Lower chest: No acute abnormality. Hepatobiliary: Mild nodularity of the hepatic contour suggestive of cirrhosis. Normal gallbladder. No biliary dilation. Pancreas: Unremarkable. Spleen: Unremarkable. Adrenals/Urinary Tract: Unremarkable adrenal glands. No suspicious renal lesion. No urinary calculi or hydronephrosis. Unremarkable bladder. Stomach/Bowel: Small hiatal hernia. Unremarkable stomach. Normal caliber large and small bowel. Colonic diverticulosis without  diverticulitis. Normal appendix. Vascular/Lymphatic: Aortic atherosclerosis. No enlarged abdominal or pelvic lymph nodes. Reproductive: Hysterectomy. Other: No free intraperitoneal fluid or air. Musculoskeletal: Degenerative arthritis both hips and thoracolumbar spine. No acute abnormality. IMPRESSION: No acute abnormality in the abdomen or pelvis. Cirrhosis. Small hiatal hernia. Colonic diverticulosis without diverticulitis. Electronically Signed   By: Placido Sou M.D.   On: 08/04/2022 02:22    Radiological Exams on Admission: US Abdomen Limited RUQ (LIVER/GB)  Result Date: 08/04/2022 CLINICAL DATA:  Right upper quadrant abdominal pain EXAM: ULTRASOUND ABDOMEN LIMITED RIGHT UPPER QUADRANT COMPARISON:  CT abdomen pelvis 05/07/2022 FINDINGS: Gallbladder: Gallstones: None Sludge: None Gallbladder Wall: Within normal limits Pericholecystic fluid: None Sonographic Murphy's Sign: Negative per technologist Common bile duct: Diameter: 2 mm Liver: Parenchymal echogenicity: Diffusely increased Contours: Nodular Lesions: None Portal vein: Patent.  Hepatopetal flow Other: None. IMPRESSION: 1. Cirrhotic liver morphology. No focal hepatic lesion. 2. No acute abnormality  of the liver or gallbladder. Electronically Signed   By: Miachel Roux M.D.   On: 08/04/2022 09:18   CT HEAD WO CONTRAST (5MM)  Result Date: 08/04/2022 CLINICAL DATA:  Delirium, confusion and altered mentation with weakness. Loss of ambulatory status. EXAM: CT HEAD WITHOUT CONTRAST TECHNIQUE: Contiguous axial images were obtained from the base of the skull through the vertex without intravenous contrast. RADIATION DOSE REDUCTION: This exam was performed according to the departmental dose-optimization program which includes automated exposure control, adjustment of the mA and/or kV according to patient size and/or use of iterative reconstruction technique. COMPARISON:  Head CT 10/17/2021 FINDINGS: Brain: There is chronic encephalomalacia in the left-greater-than-right frontal lobes with chronic infarcts, on the left partially extending into the upper temporal area. There is mild-to-moderate atrophy, small-vessel disease and atrophic/ex vacuo ventriculomegaly, greater in the left frontal horn as before but without midline shift. The cerebellum and brainstem appear relatively normal. There is an old right parasellar aneurysm clip again noted and a 10 mm rounded calcified mass just above it which is probably a calcified aneurysm. Vascular: As above. In addition there are scattered calcifications in the siphons but no hyperdense central vessels. Skull: Negative for fractures or focal lesions. There are chronic healed right calvarial craniotomy changes. There is a chronic healed fracture deformity of the left zygoma. Sinuses/Orbits: No acute abnormality. Other: No mastoid effusion. Nasal septum deviated right with chronic nasal bone fracture. IMPRESSION: 1. No acute intracranial CT findings or interval changes. 2. Atrophy, small-vessel disease and bifrontal/left temporal encephalomalacia. 3. 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 Electronically Signed   By: Telford Nab M.D.    On: 08/04/2022 02:27   CT ABDOMEN PELVIS W CONTRAST  Result Date: 08/04/2022 CLINICAL DATA:  Acute nonlocalized abdominal pain EXAM: CT ABDOMEN AND PELVIS WITH CONTRAST TECHNIQUE: Multidetector CT imaging of the abdomen and pelvis was performed using the standard protocol following bolus administration of intravenous contrast. RADIATION DOSE REDUCTION: This exam was performed according to the departmental dose-optimization program which includes automated exposure control, adjustment of the mA and/or kV according to patient size and/or use of iterative reconstruction technique. CONTRAST:  156m OMNIPAQUE IOHEXOL 300 MG/ML  SOLN COMPARISON:  CT 05/07/2022 FINDINGS: Lower chest: No acute abnormality. Hepatobiliary: Mild nodularity of the hepatic contour suggestive of cirrhosis. Normal gallbladder. No biliary dilation. Pancreas: Unremarkable. Spleen: Unremarkable. Adrenals/Urinary Tract: Unremarkable adrenal glands. No suspicious renal lesion. No urinary calculi or hydronephrosis. Unremarkable bladder. Stomach/Bowel: Small hiatal hernia. Unremarkable stomach. Normal caliber large and small bowel. Colonic diverticulosis without diverticulitis. Normal appendix. Vascular/Lymphatic: Aortic  atherosclerosis. No enlarged abdominal or pelvic lymph nodes. Reproductive: Hysterectomy. Other: No free intraperitoneal fluid or air. Musculoskeletal: Degenerative arthritis both hips and thoracolumbar spine. No acute abnormality. IMPRESSION: No acute abnormality in the abdomen or pelvis. Cirrhosis. Small hiatal hernia. Colonic diverticulosis without diverticulitis. Electronically Signed   By: Placido Sou M.D.   On: 08/04/2022 02:22    DVT Prophylaxis -SCD /Heparin AM Labs Ordered, also please review Full Orders  Family Communication: Admission, patients condition and plan of care including tests being ordered have been discussed with the patient  who indicate understanding and agree with the plan   Condition   -Stable  Roxan Hockey M.D on 08/04/2022 at 2:42 PM Go to [email or web address] -  for contact info  [facility] - Office  [phone]

## 2022-08-04 NOTE — Progress Notes (Signed)
   08/04/22 1643  Assess: MEWS Score  BP (!) 144/68  Pulse Rate (!) 111  Resp 20  Assess: MEWS Score  MEWS Temp 0  MEWS Systolic 0  MEWS Pulse 2  MEWS RR 0  MEWS LOC 0  MEWS Score 2  MEWS Score Color Yellow  Assess: if the MEWS score is Yellow or Red  Were vital signs taken at a resting state? Yes  Focused Assessment No change from prior assessment  Does the patient meet 2 or more of the SIRS criteria? No  MEWS guidelines implemented *See Row Information* Yes  Treat  MEWS Interventions Escalated (See documentation below)  Pain Scale 0-10  Pain Score 0  Breathing 0  Negative Vocalization 0  Facial Expression 0  Body Language 0  Consolability 0  PAINAD Score 0  Take Vital Signs  Increase Vital Sign Frequency  Yellow: Q 2hr X 2 then Q 4hr X 2, if remains yellow, continue Q 4hrs  Escalate  MEWS: Escalate Yellow: discuss with charge nurse/RN and consider discussing with provider and RRT  Notify: Charge Nurse/RN  Name of Charge Nurse/RN Notified N Lowry Ram  Date Charge Nurse/RN Notified 08/04/22  Time Charge Nurse/RN Notified 1649  Assess: SIRS CRITERIA  SIRS Temperature  0  SIRS Pulse 1  SIRS Respirations  0  SIRS WBC 1  SIRS Score Sum  2

## 2022-08-04 NOTE — TOC Initial Note (Signed)
Transition of Care [facility]) - Initial/Assessment Note    Patient Details  Name: [name] MRN: [id number] Date of Birth: 1956-02-03  Transition of Care [facility]) CM/SW Contact:    Boneta Lucks, RN Phone Number: 08/04/2022, 1:22 PM  Clinical Narrative:    Patient admitted with transaminitis.  TOC consulted for Substance abuse resources. Patient has refused in the past. TOC spoke with her boyfriend, Herbie [place], listed on the chart, she has never been to [employer] or done any type of counseling. TOC explain we will added resources to AVS for patient to review if she changes her mind.         Expected Discharge Plan: Home/Self Care Barriers to Discharge: Continued Medical Work up   Patient Goals and CMS Choice Patient states their goals for this hospitalization and ongoing recovery are:: to go home. CMS Medicare.gov Compare Post Acute Care list provided to:: Patient Represenative (must comment) Choice offered to / list presented to : NA (Boyfriend)  Expected Discharge Plan and Services Expected Discharge Plan: Home/Self Care        Prior Living Arrangements/Services            Current home services: DME    Activities of Daily Living Home Assistive Devices/Equipment: Wheelchair ADL Screening (condition at time of admission) Patient's cognitive ability adequate to safely complete daily activities?: No Is the patient deaf or have difficulty hearing?: No Does the patient have difficulty seeing, even when wearing glasses/contacts?: No Does the patient have difficulty concentrating, remembering, or making decisions?: Yes Patient able to express need for assistance with ADLs?: Yes Does the patient have difficulty dressing or bathing?: Yes Independently performs ADLs?: No Communication: Independent Dressing (OT): Needs assistance Is this a change from baseline?: Pre-admission baseline Grooming: Needs assistance Is this a change from baseline?: Pre-admission baseline Feeding:  Needs assistance Is this a change from baseline?: Pre-admission baseline Bathing: Needs assistance Is this a change from baseline?: Pre-admission baseline Toileting: Needs assistance Is this a change from baseline?: Pre-admission baseline In/Out Bed: Needs assistance Is this a change from baseline?: Pre-admission baseline Walks in Home: Needs assistance Is this a change from baseline?: Pre-admission baseline Does the patient have difficulty walking or climbing stairs?: Yes Weakness of Legs: Both Weakness of Arms/Hands: Both  Permission Sought/Granted     Emotional Assessment     Admission diagnosis:  Hypokalemia [E87.6] Transaminitis [R74.01] Elevated liver enzymes [R74.8] Abdominal pain, unspecified abdominal location [R10.9] Patient Active Problem List   Diagnosis Date Noted   Transaminitis 08/04/2022   Hypomagnesemia 11/20/2021   Subtherapeutic serum dilantin level    Seizure-like activity ([place]) 11/19/2021   Essential hypertension 06/16/2021   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[place]) 06/16/2021   Acute lower UTI 05/28/2021   Severe sepsis ([place]) 05/28/2021   Lactic acidosis 12/19/2020   Encephalopathy acute 31/54/0086   Acute metabolic encephalopathy 76/19/5093   Acute blood loss anemia 11/02/2019   Overweight (BMI 25.0-29.9) 11/02/2019   Gait disturbance    Rectal bleeding 10/08/2019   Hyponatremia    Diarrhea 04/11/2019   Nausea, vomiting, and diarrhea    ARF (acute renal failure) ([place]) 04/10/2019   Acute kidney injury ([place]) 01/22/2019   History of stroke 01/22/2019   Asthma 01/22/2019   Gastroenteritis 01/22/2019   Stroke ([place]) 06/14/2018   Cirrhosis of liver without ascites ([place]) 04/21/2017   AKI (acute kidney injury) ([place]) 07/30/2016   Anemia 11/25/2015   Abnormal liver function    Coffee ground emesis    Reflux esophagitis    Sinus  tachycardia ([place]) 10/10/2014   Hypokalemia 10/10/2014   Seizure disorder ([place]) 10/10/2014   Alcohol abuse 09/05/2012   Odynophagia  09/05/2012   GERD (gastroesophageal reflux disease) 09/05/2012   Abdominal pain 09/05/2012   FX CLOSED TIBIA NOS 07/27/2007   STROKE-With also h/o Aneurysm clipping? 07/25/2007   Seizures ([place]) 07/25/2007   PCP:  Carrolyn Meiers, MD Pharmacy:   [facility], [address] [address] [place] [address] Phone: [phone] Fax: [phone]

## 2022-08-04 NOTE — Progress Notes (Signed)
Patient rested quietly this shift without c/o pain or anxiety, followed all commands and answered all questions appropriately. CIWA score remains low

## 2022-08-04 NOTE — ED Notes (Signed)
ED Provider at bedside. 

## 2022-08-05 DIAGNOSIS — K7010 Alcoholic hepatitis without ascites: Secondary | ICD-10-CM | POA: Diagnosis present

## 2022-08-05 DIAGNOSIS — Z87891 Personal history of nicotine dependence: Secondary | ICD-10-CM | POA: Diagnosis not present

## 2022-08-05 DIAGNOSIS — Z79899 Other long term (current) drug therapy: Secondary | ICD-10-CM | POA: Diagnosis not present

## 2022-08-05 DIAGNOSIS — K589 Irritable bowel syndrome without diarrhea: Secondary | ICD-10-CM | POA: Diagnosis present

## 2022-08-05 DIAGNOSIS — E871 Hypo-osmolality and hyponatremia: Secondary | ICD-10-CM | POA: Diagnosis present

## 2022-08-05 DIAGNOSIS — E875 Hyperkalemia: Secondary | ICD-10-CM | POA: Diagnosis present

## 2022-08-05 DIAGNOSIS — K2100 Gastro-esophageal reflux disease with esophagitis, without bleeding: Secondary | ICD-10-CM | POA: Diagnosis present

## 2022-08-05 DIAGNOSIS — J45909 Unspecified asthma, uncomplicated: Secondary | ICD-10-CM | POA: Diagnosis present

## 2022-08-05 DIAGNOSIS — K7460 Unspecified cirrhosis of liver: Secondary | ICD-10-CM | POA: Diagnosis present

## 2022-08-05 DIAGNOSIS — I10 Essential (primary) hypertension: Secondary | ICD-10-CM | POA: Diagnosis present

## 2022-08-05 DIAGNOSIS — G4730 Sleep apnea, unspecified: Secondary | ICD-10-CM | POA: Diagnosis present

## 2022-08-05 DIAGNOSIS — G9389 Other specified disorders of brain: Secondary | ICD-10-CM | POA: Diagnosis present

## 2022-08-05 DIAGNOSIS — R7401 Elevation of levels of liver transaminase levels: Secondary | ICD-10-CM | POA: Diagnosis not present

## 2022-08-05 DIAGNOSIS — G9341 Metabolic encephalopathy: Secondary | ICD-10-CM | POA: Diagnosis present

## 2022-08-05 DIAGNOSIS — E860 Dehydration: Secondary | ICD-10-CM | POA: Diagnosis present

## 2022-08-05 DIAGNOSIS — D61818 Other pancytopenia: Secondary | ICD-10-CM | POA: Diagnosis present

## 2022-08-05 DIAGNOSIS — E876 Hypokalemia: Secondary | ICD-10-CM | POA: Diagnosis present

## 2022-08-05 DIAGNOSIS — Z8711 Personal history of peptic ulcer disease: Secondary | ICD-10-CM | POA: Diagnosis not present

## 2022-08-05 DIAGNOSIS — K219 Gastro-esophageal reflux disease without esophagitis: Secondary | ICD-10-CM | POA: Diagnosis present

## 2022-08-05 DIAGNOSIS — Y908 Blood alcohol level of 240 mg/100 ml or more: Secondary | ICD-10-CM | POA: Diagnosis present

## 2022-08-05 DIAGNOSIS — F1010 Alcohol abuse, uncomplicated: Secondary | ICD-10-CM | POA: Diagnosis present

## 2022-08-05 DIAGNOSIS — Z8673 Personal history of transient ischemic attack (TIA), and cerebral infarction without residual deficits: Secondary | ICD-10-CM | POA: Diagnosis not present

## 2022-08-05 LAB — COMPREHENSIVE METABOLIC PANEL
ALT: 133 U/L — ABNORMAL HIGH (ref 0–44)
AST: 295 U/L — ABNORMAL HIGH (ref 15–41)
Albumin: 2.6 g/dL — ABNORMAL LOW (ref 3.5–5.0)
Alkaline Phosphatase: 112 U/L (ref 38–126)
Anion gap: 6 (ref 5–15)
BUN: 5 mg/dL — ABNORMAL LOW (ref 8–23)
CO2: 22 mmol/L (ref 22–32)
Calcium: 8.2 mg/dL — ABNORMAL LOW (ref 8.9–10.3)
Chloride: 110 mmol/L (ref 98–111)
Creatinine, Ser: 0.6 mg/dL (ref 0.44–1.00)
GFR, Estimated: >60 mL/min (ref >60)
Glucose, Bld: 90 mg/dL (ref 70–99)
Potassium: 5.7 mmol/L — ABNORMAL HIGH (ref 3.5–5.1)
Sodium: 138 mmol/L (ref 135–145)
Total Bilirubin: 1.1 mg/dL (ref 0.3–1.2)
Total Protein: 6.2 g/dL — ABNORMAL LOW (ref 6.5–8.1)

## 2022-08-05 LAB — HEPATITIS PANEL, ACUTE: Hep A IgM: NEGATIVE — AB

## 2022-08-05 LAB — PROTIME-INR
INR: 1.3 — ABNORMAL HIGH (ref 0.8–1.2)
Prothrombin Time: 16.2 seconds — ABNORMAL HIGH (ref 11.4–15.2)

## 2022-08-05 LAB — CBC
HCT: 28.1 % — ABNORMAL LOW (ref 36.0–46.0)
Hemoglobin: 9.4 g/dL — ABNORMAL LOW (ref 12.0–15.0)
MCH: 28.2 pg (ref 26.0–34.0)
MCHC: 33.5 g/dL (ref 30.0–36.0)
MCV: 84.4 fL (ref 80.0–100.0)
Platelets: 143 10*3/uL — ABNORMAL LOW (ref 150–400)
RBC: 3.33 MIL/uL — ABNORMAL LOW (ref 3.87–5.11)
RDW: 17.8 % — ABNORMAL HIGH (ref 11.5–15.5)
WBC: 3.1 10*3/uL — ABNORMAL LOW (ref 4.0–10.5)
nRBC: 0 % (ref 0.0–0.2)

## 2022-08-05 LAB — PHENYTOIN LEVEL, TOTAL: Phenytoin Lvl: 6.1 ug/mL — ABNORMAL LOW (ref 10.0–20.0)

## 2022-08-05 LAB — HIV ANTIBODY (ROUTINE TESTING W REFLEX): HIV Screen 4th Generation wRfx: NONREACTIVE

## 2022-08-05 LAB — HCV INTERPRETATION

## 2022-08-05 MED ORDER — SODIUM CHLORIDE 0.9 % IV SOLN: INTRAVENOUS | Status: AC

## 2022-08-05 MED ORDER — PHENYTOIN SODIUM EXTENDED 100 MG PO CAPS
200.0000 mg | ORAL_CAPSULE | Freq: Two times a day (BID) | ORAL | Status: DC
  Administered 2022-08-05 – 2022-08-06 (×3): 200 mg via ORAL
  Filled 2022-08-05 (×2): qty 2

## 2022-08-05 MED ORDER — LACTULOSE 10 GM/15ML PO SOLN
10.0000 g | Freq: Two times a day (BID) | ORAL | Status: DC
  Administered 2022-08-05 – 2022-08-06 (×3): 10 g via ORAL
  Filled 2022-08-05 (×2): qty 30

## 2022-08-05 NOTE — Progress Notes (Signed)
Patient stable was OOB to chair today. CIWA score low no prn medications given. Patient received new IV right hand. Patient remains on IVF. Patient able to feed herself.

## 2022-08-05 NOTE — Progress Notes (Signed)
PROGRESS NOTE     [name], is a 66 y.o. female, DOB - May 01, 1956, CZY:[id number]  Admit date - 08/03/2022   Admitting Physician Bladyn Tipps Mariea Clonts, MD  Outpatient Primary MD for the patient is Fanta, Wayland Salinas, MD  LOS - 0  Chief Complaint  Patient presents with   Altered Mental Status   Abdominal Pain        Brief Narrative:   66 y.o. female  with PMHx relevant for h/o IBS, asthma, h/o Stroke at age 55 (required craniotomy), long standing h/o seizures, GERD/PUD and chronic alcohol abuse presents to the ED with increasing confusion and disorientation for the last 3 weeks associated with generalized weakness and abdominal discomfort admitted on 08/04/2022 with acute metabolic fatigue in the setting of hyponatremia and hypokalemia -As per caregiver patient will continue to drink beer----Patient admits to drinking 6-8 beers some days    -Assessment and Plan:  1)Acute metabolic encephalopathy--- multifactorial... In the setting of EtOH abuse and hyponatremia as well as liver dysfunction -Mentation improving overall - 2)Hyponatremia--- sodium is down to 121-suspect this is related to beer potomania and dehydration --As per caregiver patient will continue to drink beer----Patient admits to drinking 6-8 beers some days -Sodium normalized with cessation of EtOH and with IV fluids   3)Hypokalemia--- potassium is down to 2.4 patient denies diarrhea or vomiting but oral intake has been poor -Potassium appears to be overcorrected, potassium is now up to 5.7 -Stop further potassium replacement and recheck BMP in a.m.    4)Alcoholic hepatitis--- high risk for DTs -AST to ALT ratio is consistent with EtOH hepatitis --CT abdomen and pelvis and right upper quadrant ultrasound consistent liver cirrhosis -Acute viral hepatitis profile is Negative -Ammonia actually mildly elevated--continue lactulose Lorazepam per CIWA protocol -Multivitamins ordered    Latest Ref Rng & Units  08/05/2022    3:37 AM 08/04/2022   12:51 AM 05/07/2022   12:31 AM  Hepatic Function  Total Protein 6.5 - 8.1 g/dL 6.2  7.2  8.7   Albumin 3.5 - 5.0 g/dL 2.6  3.2  3.9   AST 15 - 41 U/L 295  742  53   ALT 0 - 44 U/L 133  219  24   Alk Phosphatase 38 - 126 U/L 112  119  157   Total Bilirubin 0.3 - 1.2 mg/dL 1.1  1.1  0.5    -LFTs are trending down    5)Pancytopenia ----  -Related to underlying liver cirrhosis compounded by direct toxic effect of alcohol on bone marrow -Hgb stable -Platelets improving -No bleeding concerns at this time   6)long standing h/o seizures----no history of recent seizures -Continue phenobarbital and phenytoin -Dilantin level subtherapeutic, change to Vantin to 200 twice daily from 100 3 times daily   7)H/o Stroke at age 42 (required craniotomy),--- weakness gait disturbance most likely related to low sodium and alcohol abuse rather than acute stroke --CT head without new acute findings however there is evidence of Atrophy, small-vessel disease and bifrontal/left temporal encephalomalacia, 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   8)GERD/PUD--- no bleeding concerns at this time continue PPI   9)Asthma--- continue bronchodilators as needed  10) generalized weakness and deconditioning--- anticipate she will benefit from home health PT   Disposition/Need for in-Hospital Stay- patient unable to be discharged at this time due to delicate fluid balance and gait disturbance in setting of acute abnormalities, pancytopenia dehydration and alcohol abuse   Status is: Inpatient   Remains inpatient appropriate because:  Dispo: The patient is from: Home              Anticipated d/c is to: Home              Anticipated d/c date is: 1 days              Patient currently is not medically stable to d/c. Barriers: Not Clinically Stable-   Code Status :  -  Code Status: Full Code   Family Communication:    NA (patient is alert,  awake and coherent)   DVT Prophylaxis  :   - SCDs   heparin injection 5,000 Units Start: 08/04/22 0845 SCDs Start: 08/04/22 0748 Place TED hose Start: 08/04/22 0748   Lab Results  Component Value Date   PLT 143 (L) 08/05/2022    Inpatient Medications  Scheduled Meds:  diazepam  2 mg Oral TID   folic acid  1 mg Oral Daily   heparin  5,000 Units Subcutaneous Q8H   lactulose  10 g Oral BID   multivitamin with minerals  1 tablet Oral Daily   pantoprazole  40 mg Oral Daily   PHENobarbital  32.4 mg Oral BID   phenytoin  200 mg Oral BID   sodium chloride flush  3 mL Intravenous Q12H   sodium chloride flush  3 mL Intravenous Q12H   thiamine  100 mg Oral Daily   Or   thiamine  100 mg Intravenous Daily   Continuous Infusions:  sodium chloride     sodium chloride 40 mL/hr at 08/05/22 0840   PRN Meds:.sodium chloride, acetaminophen **OR** acetaminophen, albuterol, bisacodyl, LORazepam **OR** LORazepam, ondansetron **OR** ondansetron (ZOFRAN) IV, sodium chloride flush, traZODone   Anti-infectives (From admission, onward)    None         Subjective: [name] today has no fevers, no emesis,  No chest pain,   -More coherent More appropriate and cooperative  Objective: Vitals:   08/04/22 2214 08/05/22 0055 08/05/22 0452 08/05/22 1400  BP: (!) 140/64 135/61 (!) 119/49 (!) 116/59  Pulse: (!) 102 (!) 105 (!) 103 88  Resp: 18 18 20 20   Temp: 97.7 F (36.5 C) 97.7 F (36.5 C) (!) 97 F (36.1 C) (!) 97.5 F (36.4 C)  TempSrc:    Oral  SpO2: 100% 100% 100% 100%  Weight:      Height:        Intake/Output Summary (Last 24 hours) at 08/05/2022 1653 Last data filed at 08/05/2022 1500 Gross per 24 hour  Intake 2923.44 ml  Output 600 ml  Net 2323.44 ml   Filed Weights   08/03/22 2215 08/04/22 0629  Weight: 63.5 kg 62.9 kg    Physical Exam  Gen:- Awake Alert, in no acute distress HEENT:- [place].AT, No sclera icterus Neck-Supple Neck,No JVD,.  Lungs-  CTAB , fair  symmetrical air movement CV- S1, S2 normal, regular  Abd-  +ve B.Sounds, Abd Soft, No tenderness,    Extremity/Skin:- No  edema, pedal pulses present  Psych-affect is appropriate, oriented x3 Neuro--residual right-sided hemiparesis with contracture of the right upper extremity/wrist and hand -No additional new acute focal deficits at this time  Data Reviewed: I have personally reviewed following labs and imaging studies  CBC: Recent Labs  Lab 08/04/22 0051 08/05/22 0337  WBC 3.6* 3.1*  HGB 10.4* 9.4*  HCT 31.0* 28.1*  MCV 83.6 84.4  PLT 130* 143*   Basic Metabolic Panel: Recent Labs  Lab 08/04/22 0051 08/05/22 0337  NA 121*  138  K 2.4* 5.7*  CL 85* 110  CO2 22 22  GLUCOSE 102* 90  BUN <5* 5*  CREATININE 0.53 0.60  CALCIUM 7.9* 8.2*  MG 1.5*  --    GFR: Estimated Creatinine Clearance: 60.5 mL/min (by C-G formula based on SCr of 0.6 mg/dL). Liver Function Tests: Recent Labs  Lab 08/04/22 0051 08/05/22 0337  AST 742* 295*  ALT 219* 133*  ALKPHOS 119 112  BILITOT 1.1 1.1  PROT 7.2 6.2*  ALBUMIN 3.2* 2.6*   Cardiac Enzymes: No results for input(s): "CKTOTAL", "CKMB", "CKMBINDEX", "TROPONINI" in the last 168 hours. BNP (last 3 results) No results for input(s): "PROBNP" in the last 8760 hours. HbA1C: No results for input(s): "HGBA1C" in the last 72 hours. Sepsis Labs: @LABRCNTIP (procalcitonin:4,lacticidven:4) )No results found for this or any previous visit (from the past 240 hour(s)).    Radiology Studies: US Abdomen Limited RUQ (LIVER/GB)  Result Date: 08/04/2022 CLINICAL DATA:  Right upper quadrant abdominal pain EXAM: ULTRASOUND ABDOMEN LIMITED RIGHT UPPER QUADRANT COMPARISON:  CT abdomen pelvis 05/07/2022 FINDINGS: Gallbladder: Gallstones: None Sludge: None Gallbladder Wall: Within normal limits Pericholecystic fluid: None Sonographic Murphy's Sign: Negative per technologist Common bile duct: Diameter: 2 mm Liver: Parenchymal echogenicity: Diffusely  increased Contours: Nodular Lesions: None Portal vein: Patent.  Hepatopetal flow Other: None. IMPRESSION: 1. Cirrhotic liver morphology. No focal hepatic lesion. 2. No acute abnormality of the liver or gallbladder. Electronically Signed   By: Acquanetta Belling M.D.   On: 08/04/2022 09:18   CT HEAD WO CONTRAST ( )  Result Date: 08/04/2022 CLINICAL DATA:  Delirium, confusion and altered mentation with weakness. Loss of ambulatory status. EXAM: CT HEAD WITHOUT CONTRAST TECHNIQUE: Contiguous axial images were obtained from the base of the skull through the vertex without intravenous contrast. RADIATION DOSE REDUCTION: This exam was performed according to the departmental dose-optimization program which includes automated exposure control, adjustment of the mA and/or kV according to patient size and/or use of iterative reconstruction technique. COMPARISON:  Head CT 10/17/2021 FINDINGS: Brain: There is chronic encephalomalacia in the left-greater-than-right frontal lobes with chronic infarcts, on the left partially extending into the upper temporal area. There is mild-to-moderate atrophy, small-vessel disease and atrophic/ex vacuo ventriculomegaly, greater in the left frontal horn as before but without midline shift. The cerebellum and brainstem appear relatively normal. There is an old right parasellar aneurysm clip again noted and a 10 mm rounded calcified mass just above it which is probably a calcified aneurysm. Vascular: As above. In addition there are scattered calcifications in the siphons but no hyperdense central vessels. Skull: Negative for fractures or focal lesions. There are chronic healed right calvarial craniotomy changes. There is a chronic healed fracture deformity of the left zygoma. Sinuses/Orbits: No acute abnormality. Other: No mastoid effusion. Nasal septum deviated right with chronic nasal bone fracture. IMPRESSION: 1. No acute intracranial CT findings or interval changes. 2. Atrophy, small-vessel  disease and bifrontal/left temporal encephalomalacia. 3. 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 Electronically Signed   By: Almira Bar M.D.   On: 08/04/2022 02:27   CT ABDOMEN PELVIS W CONTRAST  Result Date: 08/04/2022 CLINICAL DATA:  Acute nonlocalized abdominal pain EXAM: CT ABDOMEN AND PELVIS WITH CONTRAST TECHNIQUE: Multidetector CT imaging of the abdomen and pelvis was performed using the standard protocol following bolus administration of intravenous contrast. RADIATION DOSE REDUCTION: This exam was performed according to the departmental dose-optimization program which includes automated exposure control, adjustment of the mA and/or kV according to patient size  and/or use of iterative reconstruction technique. CONTRAST:  OMNIPAQUE IOHEXOL 300 MG/ML  SOLN COMPARISON:  CT 05/07/2022 FINDINGS: Lower chest: No acute abnormality. Hepatobiliary: Mild nodularity of the hepatic contour suggestive of cirrhosis. Normal gallbladder. No biliary dilation. Pancreas: Unremarkable. Spleen: Unremarkable. Adrenals/Urinary Tract: Unremarkable adrenal glands. No suspicious renal lesion. No urinary calculi or hydronephrosis. Unremarkable bladder. Stomach/Bowel: Small hiatal hernia. Unremarkable stomach. Normal caliber large and small bowel. Colonic diverticulosis without diverticulitis. Normal appendix. Vascular/Lymphatic: Aortic atherosclerosis. No enlarged abdominal or pelvic lymph nodes. Reproductive: Hysterectomy. Other: No free intraperitoneal fluid or air. Musculoskeletal: Degenerative arthritis both hips and thoracolumbar spine. No acute abnormality. IMPRESSION: No acute abnormality in the abdomen or pelvis. Cirrhosis. Small hiatal hernia. Colonic diverticulosis without diverticulitis. Electronically Signed   By: Minerva Fester M.D.   On: 08/04/2022 02:22     Scheduled Meds:  diazepam  2 mg Oral TID   folic acid  1 mg Oral Daily   heparin  5,000 Units Subcutaneous  Q8H   lactulose  10 g Oral BID   multivitamin with minerals  1 tablet Oral Daily   pantoprazole  40 mg Oral Daily   PHENobarbital  32.4 mg Oral BID   phenytoin  200 mg Oral BID   sodium chloride flush  3 mL Intravenous Q12H   sodium chloride flush  3 mL Intravenous Q12H   thiamine  100 mg Oral Daily   Or   thiamine  100 mg Intravenous Daily   Continuous Infusions:  sodium chloride     sodium chloride 40 mL/hr at 08/05/22 0840     LOS: 0 days    Shon Hale M.D on 08/05/2022 at 4:53 PM  Go to [email or web address] - for contact info  [facility] - Office  [phone]  If 7PM-7AM, please contact night-coverage [email or web address] 08/05/2022, 4:53 PM

## 2022-08-05 NOTE — Progress Notes (Signed)
Vital signs stable. Patient rested throughout the night. CIWA score low, no prn medications required.

## 2022-08-06 LAB — COMPREHENSIVE METABOLIC PANEL
ALT: 96 U/L — ABNORMAL HIGH (ref 0–44)
AST: 153 U/L — ABNORMAL HIGH (ref 15–41)
Albumin: 2.6 g/dL — ABNORMAL LOW (ref 3.5–5.0)
Alkaline Phosphatase: 106 U/L (ref 38–126)
Anion gap: 8 (ref 5–15)
BUN: 5 mg/dL — ABNORMAL LOW (ref 8–23)
CO2: 25 mmol/L (ref 22–32)
Calcium: 8.5 mg/dL — ABNORMAL LOW (ref 8.9–10.3)
Chloride: 106 mmol/L (ref 98–111)
Creatinine, Ser: 0.59 mg/dL (ref 0.44–1.00)
GFR, Estimated: >60 mL/min (ref >60)
Glucose, Bld: 95 mg/dL (ref 70–99)
Potassium: 3.7 mmol/L (ref 3.5–5.1)
Sodium: 139 mmol/L (ref 135–145)
Total Bilirubin: 0.7 mg/dL (ref 0.3–1.2)
Total Protein: 6.2 g/dL — ABNORMAL LOW (ref 6.5–8.1)

## 2022-08-06 MED ORDER — THIAMINE HCL 100 MG PO TABS: 100.0000 mg | ORAL_TABLET | Freq: Every day | ORAL | 3 refills | Status: DC

## 2022-08-06 MED ORDER — ALBUTEROL SULFATE HFA 108 (90 BASE) MCG/ACT IN AERS: 2.0000 | INHALATION_SPRAY | Freq: Four times a day (QID) | RESPIRATORY_TRACT | 2 refills | Status: AC | PRN

## 2022-08-06 MED ORDER — POTASSIUM CHLORIDE CRYS ER 20 MEQ PO TBCR: 20.0000 meq | EXTENDED_RELEASE_TABLET | Freq: Every day | ORAL | 1 refills | Status: DC

## 2022-08-06 MED ORDER — SUCRALFATE 1 GM/10ML PO SUSP: 1.0000 g | Freq: Three times a day (TID) | ORAL | 0 refills | Status: DC

## 2022-08-06 MED ORDER — PHENOBARBITAL 32.4 MG PO TABS: 32.4000 mg | ORAL_TABLET | Freq: Two times a day (BID) | ORAL | 1 refills | Status: DC

## 2022-08-06 MED ORDER — PANTOPRAZOLE SODIUM 40 MG PO TBEC: 40.0000 mg | DELAYED_RELEASE_TABLET | Freq: Every day | ORAL | 1 refills | Status: DC

## 2022-08-06 MED ORDER — VITAMIN B-1 100 MG PO TABS: 100.0000 mg | ORAL_TABLET | Freq: Every day | ORAL | 1 refills | Status: DC

## 2022-08-06 MED ORDER — PHENYTOIN SODIUM EXTENDED 200 MG PO CAPS: 200.0000 mg | ORAL_CAPSULE | Freq: Two times a day (BID) | ORAL | 3 refills | Status: DC

## 2022-08-06 MED ORDER — LACTULOSE 10 GM/15ML PO SOLN: 10.0000 g | Freq: Every day | ORAL | 0 refills | Status: DC

## 2022-08-06 MED ORDER — ADULT MULTIVITAMIN W/MINERALS CH: 1.0000 | ORAL_TABLET | Freq: Every day | ORAL | 1 refills | Status: DC

## 2022-08-06 NOTE — Discharge Instructions (Signed)
1)Repeat CMP and CBC Blood Tests around Tuesday 08/10/22  2)Avoid ibuprofen/Advil/Aleve/Motrin/[place] Powders/Naproxen/BC powders/Meloxicam/Diclofenac/Indomethacin and other Nonsteroidal anti-inflammatory medications as these will make you more likely to bleed and can cause stomach ulcers, can also cause Kidney problems.  3)Please take all medications as prescribed----please do Not skip medications

## 2022-08-06 NOTE — Discharge Summary (Signed)
[name], is a 66 y.o. female  DOB [birth year]  MRN [id number].  Admission date:  08/03/2022  Admitting Physician  Derryl Uher Mariea Clonts, MD  Discharge Date:  08/06/2022   Primary MD  Benetta Spar, MD  Recommendations for primary care physician for things to follow:   1)Repeat CMP and CBC Blood Tests around Tuesday 08/10/22  2)Avoid ibuprofen/Advil/Aleve/Motrin/Goody Powders/Naproxen/BC powders/Meloxicam/Diclofenac/Indomethacin and other Nonsteroidal anti-inflammatory medications as these will make you more likely to bleed and can cause stomach ulcers, can also cause Kidney problems.  3)Please take all medications as prescribed----please do Not skip medications  Admission Diagnosis  Hypokalemia [E87.6] Transaminitis [R74.01] Elevated liver enzymes [R74.8] Abdominal pain, unspecified abdominal location [R10.9] Acute hyperkalemia [E87.5]   Discharge Diagnosis  Hypokalemia [E87.6] Transaminitis [R74.01] Elevated liver enzymes [R74.8] Abdominal pain, unspecified abdominal location [R10.9] Acute hyperkalemia [E87.5]    Principal Problem:   Transaminitis Active Problems:   Acute hyperkalemia      Past Medical History:  Diagnosis Date   Alcohol abuse    Asthma    Contracture of muscle of hand    right   GERD (gastroesophageal reflux disease)    HTN (hypertension)    PUD (peptic ulcer disease)    remote   Seizures (HCC)    since stroke, no recent seizures   Sleep apnea    Stroke [facility])    age 23, required brain surgery effected right side    Past Surgical History:  Procedure Laterality Date   ABDOMINAL HYSTERECTOMY  2004   complete   BRAIN SURGERY     age 64 stroke   COLONOSCOPY     COLONOSCOPY WITH PROPOFOL  10/03/2012   Dr. Darrick Penna: moderate diverticulosis, small internal hemorrhoids, TUBULAR ADENOMA. Next colonoscopy 2023 per Dr. Darrick Penna.   ESOPHAGOGASTRODUODENOSCOPY N/A  10/18/2014   Dr. Jena Gauss during inpatient hospitalization: severe exudative/erosive reflux esophagitis as source of trivial upper GI bleed. No varices    ESOPHAGOGASTRODUODENOSCOPY (EGD) WITH PROPOFOL  10/03/2012   Dr. Darrick Penna: stricture at GE junction s/p dilation, mild gastritis negative H.pylori    ESOPHAGOGASTRODUODENOSCOPY (EGD) WITH PROPOFOL N/A 12/27/2017   Grade D esophagitis. Medium-sized hiatal hernia, normal duodenum   right tib-fib fracture  2008   SAVORY DILATION  10/03/2012   Procedure: SAVORY DILATION;  Surgeon: West Bali, MD;  Location: [facility];  Service: Endoscopy;  Laterality: N/A;  started at 1303,  dilated 12.8-16mm     HPI  from the history and physical done on the day of admission:    [name]  is a 66 y.o. female  with PMHx relevant for h/o IBS, asthma, h/o Stroke at age 77 (required craniotomy), long standing h/o seizures, GERD/PUD and chronic alcohol abuse presents to the ED with increasing confusion and disorientation for the last 3 weeks associated with generalized weakness and abdominal discomfort -As per caregiver patient will continue to drink beer----Patient admits to drinking 6-8 beers some days No fever  Or chills  She has nausea but no vomiting or diarrhea -No seizures reported -CT  head without new acute findings however there is evidence of Atrophy, small-vessel disease and bifrontal/left temporal encephalomalacia, Previous right-sided healed craniotomy with right parasellar aneurysm clip. -CT abdomen and pelvis with contrast shows no acute intracranial abnormalities but there is evidence of liver cirrhosis -Right upper quadrant ultrasound consistent with liver cirrhosis without other acute findings -Serum ammonia is 37 -Viral hepatitis profile pending -Serum ammonia is elevated at 37 -WBC 3.6 Hgb 10.4--- which is close to prior baseline -Platelets is 130 -Sodium down to 121 potassium 2.4 -AST 742 ALT 219 -Creatinine 0.53     Hospital Course:    Brief Narrative:   66 y.o. female  with PMHx relevant for h/o IBS, asthma, h/o Stroke at age 57 (required craniotomy), long standing h/o seizures, GERD/PUD and chronic alcohol abuse presents to the ED with increasing confusion and disorientation for the last 3 weeks associated with generalized weakness and abdominal discomfort admitted on 08/04/2022 with acute metabolic fatigue in the setting of hyponatremia and hypokalemia -As per caregiver patient will continue to drink beer----Patient admits to drinking 6-8 beers some days    -Assessment and Plan:   1)Acute Metabolic Encephalopathy--- multifactorial... In the setting of EtOH abuse and hyponatremia as well as liver dysfunction -Mentation improved and is Now back to baseline - 2)Hyponatremia--- sodium is down to 121-suspect this is related to beer potomania and dehydration --As per caregiver patient will continue to drink beer----Patient admits to drinking 6-8 beers some days -Sodium normalized with cessation of EtOH and with IV fluids -Sodium at discharge is 139    3)Hypokalemia--- potassium is down to 2.4 patient denies diarrhea or vomiting but oral intake has been poor -Potassium has normalized    4)Alcoholic hepatitis--- treated with benzos, no significant DTs at this time -AST to ALT ratio is consistent with EtOH hepatitis --CT abdomen and pelvis and right upper quadrant ultrasound consistent liver cirrhosis -Acute viral hepatitis profile is Negative -Ammonia actually mildly elevated--mentation back to normal after lactulose -Multivitamins ordered  -LFTs are trending down     Latest Ref Rng & Units 08/06/2022    4:02 AM 08/05/2022    3:37 AM 08/04/2022   12:51 AM  Hepatic Function  Total Protein 6.5 - 8.1 g/dL 6.2  6.2  7.2   Albumin 3.5 - 5.0 g/dL 2.6  2.6  3.2   AST 15 - 41 U/L 153  295  742   ALT 0 - 44 U/L 96  133  219   Alk Phosphatase 38 - 126 U/L 106  112  119   Total Bilirubin 0.3 - 1.2 mg/dL 0.7  1.1  1.1      5)Pancytopenia ----  -Related to underlying liver cirrhosis compounded by direct toxic effect of alcohol on bone marrow -Hgb stable -Platelets improving -No bleeding concerns at this time   6)long standing h/o seizures----no history of recent seizures -Continue phenobarbital  -Dilantin level subtherapeutic, change to 20 for to 200 twice daily from 100mg   3 times daily   7)H/o Stroke at age 32 (required craniotomy),--- weakness gait disturbance most likely related to low sodium and alcohol abuse rather than acute stroke --CT head without new acute findings however there is evidence of Atrophy, small-vessel disease and bifrontal/left temporal encephalomalacia, Previous right-sided healed craniotomy with right parasellar aneurysm clip.   8)GERD/PUD--- no bleeding concerns at this time  -continue PPI   9)Asthma--- continue bronchodilators as needed   10)Generalized weakness and deconditioning--- patient has wheelchair at home -Resume [facility] Aide services   Dispo:  The patient is from: Home              Anticipated d/c is to: Home  Discharge Condition: stable  Follow UP   Follow-up Information     Services, [facility]. Call.   Why: make an appointment with alcohol dependency counselor. Contact information: [address] [place] [address] [phone]                 Diet and Activity recommendation:  As advised  Discharge Instructions    Discharge Instructions     Call MD for:  difficulty breathing, headache or visual disturbances   Complete by: As directed    Call MD for:  persistant dizziness or light-headedness   Complete by: As directed    Call MD for:  persistant nausea and vomiting   Complete by: As directed    Call MD for:  temperature >100.4   Complete by: As directed    Diet - low sodium heart healthy   Complete by: As directed    Discharge instructions   Complete by: As directed    1)Repeat CMP and CBC Blood Tests around Tuesday  08/10/22  2)Avoid ibuprofen/Advil/Aleve/Motrin/Goody Powders/Naproxen/BC powders/Meloxicam/Diclofenac/Indomethacin and other Nonsteroidal anti-inflammatory medications as these will make you more likely to bleed and can cause stomach ulcers, can also cause Kidney problems.  3)Please take all medications as prescribed----please do Not skip medications   Increase activity slowly   Complete by: As directed         Discharge Medications     Allergies as of 08/06/2022   No Known Allergies      Medication List     STOP taking these medications    lisinopril 40 MG tablet Commonly known as: ZESTRIL   ondansetron 4 MG disintegrating tablet Commonly known as: ZOFRAN-ODT   thiamine 100 MG tablet Commonly known as: VITAMIN B1       TAKE these medications    albuterol 108 (90 Base) MCG/ACT inhaler Commonly known as: VENTOLIN HFA Inhale 2 puffs into the lungs every 6 (six) hours as needed for wheezing or shortness of breath.   folic acid 400 MCG tablet Commonly known as: FOLVITE Take 400 mcg by mouth daily.   HYDROcodone-acetaminophen 5-325 MG tablet Commonly known as: NORCO/VICODIN One tablet every six hours for pain.  Limit 7 days.   lactulose 10 GM/15ML solution Commonly known as: CHRONULAC Take 15 mLs (10 g total) by mouth daily.   Linzess 290 MCG Caps capsule Generic drug: linaclotide TAKE (1) CAPSULE BY MOUTH EVERY DAY.   multivitamin with minerals Tabs tablet Take 1 tablet by mouth daily.   pantoprazole 40 MG tablet Commonly known as: PROTONIX Take 1 tablet (40 mg total) by mouth daily. What changed: when to take this   PHENobarbital 32.4 MG tablet Commonly known as: LUMINAL Take 1 tablet (32.4 mg total) by mouth 2 (two) times daily.   phenytoin 200 MG ER capsule Commonly known as: DILANTIN Take 1 capsule (200 mg total) by mouth 2 (two) times daily. What changed:  medication strength how much to take when to take this   potassium chloride SA 20 MEQ  tablet Commonly known as: KLOR-CON M Take 1 tablet (20 mEq total) by mouth daily. What changed: when to take this   sucralfate 1 GM/10ML suspension Commonly known as: Carafate Take 10 mLs (1 g total) by mouth 4 (four) times daily -  with meals and at bedtime.   thiamine 100 MG tablet Commonly known  as: Vitamin B-1 Take 1 tablet (100 mg total) by mouth daily. Start taking on: August 07, 2022        Major procedures and Radiology Reports - PLEASE review detailed and final reports for all details, in brief -   US Abdomen Limited RUQ (LIVER/GB)  Result Date: 08/04/2022 CLINICAL DATA:  Right upper quadrant abdominal pain EXAM: ULTRASOUND ABDOMEN LIMITED RIGHT UPPER QUADRANT COMPARISON:  CT abdomen pelvis 05/07/2022 FINDINGS: Gallbladder: Gallstones: None Sludge: None Gallbladder Wall: Within normal limits Pericholecystic fluid: None Sonographic Murphy's Sign: Negative per technologist Common bile duct: Diameter: 2 mm Liver: Parenchymal echogenicity: Diffusely increased Contours: Nodular Lesions: None Portal vein: Patent.  Hepatopetal flow Other: None. IMPRESSION: 1. Cirrhotic liver morphology. No focal hepatic lesion. 2. No acute abnormality of the liver or gallbladder. Electronically Signed   By: Acquanetta Belling M.D.   On: 08/04/2022 09:18   CT HEAD WO CONTRAST ( )  Result Date: 08/04/2022 CLINICAL DATA:  Delirium, confusion and altered mentation with weakness. Loss of ambulatory status. EXAM: CT HEAD WITHOUT CONTRAST TECHNIQUE: Contiguous axial images were obtained from the base of the skull through the vertex without intravenous contrast. RADIATION DOSE REDUCTION: This exam was performed according to the departmental dose-optimization program which includes automated exposure control, adjustment of the mA and/or kV according to patient size and/or use of iterative reconstruction technique. COMPARISON:  Head CT 10/17/2021 FINDINGS: Brain: There is chronic encephalomalacia in the  left-greater-than-right frontal lobes with chronic infarcts, on the left partially extending into the upper temporal area. There is mild-to-moderate atrophy, small-vessel disease and atrophic/ex vacuo ventriculomegaly, greater in the left frontal horn as before but without midline shift. The cerebellum and brainstem appear relatively normal. There is an old right parasellar aneurysm clip again noted and a 10 mm rounded calcified mass just above it which is probably a calcified aneurysm. Vascular: As above. In addition there are scattered calcifications in the siphons but no hyperdense central vessels. Skull: Negative for fractures or focal lesions. There are chronic healed right calvarial craniotomy changes. There is a chronic healed fracture deformity of the left zygoma. Sinuses/Orbits: No acute abnormality. Other: No mastoid effusion. Nasal septum deviated right with chronic nasal bone fracture. IMPRESSION: 1. No acute intracranial CT findings or interval changes. 2. Atrophy, small-vessel disease and bifrontal/left temporal encephalomalacia. 3. 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 Electronically Signed   By: Almira Bar M.D.   On: 08/04/2022 02:27   CT ABDOMEN PELVIS W CONTRAST  Result Date: 08/04/2022 CLINICAL DATA:  Acute nonlocalized abdominal pain EXAM: CT ABDOMEN AND PELVIS WITH CONTRAST TECHNIQUE: Multidetector CT imaging of the abdomen and pelvis was performed using the standard protocol following bolus administration of intravenous contrast. RADIATION DOSE REDUCTION: This exam was performed according to the departmental dose-optimization program which includes automated exposure control, adjustment of the mA and/or kV according to patient size and/or use of iterative reconstruction technique. CONTRAST:  OMNIPAQUE IOHEXOL 300 MG/ML  SOLN COMPARISON:  CT 05/07/2022 FINDINGS: Lower chest: No acute abnormality. Hepatobiliary: Mild nodularity of the hepatic  contour suggestive of cirrhosis. Normal gallbladder. No biliary dilation. Pancreas: Unremarkable. Spleen: Unremarkable. Adrenals/Urinary Tract: Unremarkable adrenal glands. No suspicious renal lesion. No urinary calculi or hydronephrosis. Unremarkable bladder. Stomach/Bowel: Small hiatal hernia. Unremarkable stomach. Normal caliber large and small bowel. Colonic diverticulosis without diverticulitis. Normal appendix. Vascular/Lymphatic: Aortic atherosclerosis. No enlarged abdominal or pelvic lymph nodes. Reproductive: Hysterectomy. Other: No free intraperitoneal fluid or air. Musculoskeletal: Degenerative arthritis both hips and thoracolumbar spine. No  acute abnormality. IMPRESSION: No acute abnormality in the abdomen or pelvis. Cirrhosis. Small hiatal hernia. Colonic diverticulosis without diverticulitis. Electronically Signed   By: Minerva Fester M.D.   On: 08/04/2022 02:22    Micro Results   Today   Subjective    [name] today has no complaints -Eating and drinking well No fever  Or chills   No Nausea, Vomiting or Diarrhea   Patient has been seen and examined prior to discharge   Objective   Blood pressure 138/65, pulse 93, temperature 98.6 F (37 C), resp. rate 18, height 5\' 4"  (1.626 m), weight 62.9 kg, SpO2 97 %.   Intake/Output Summary (Last 24 hours) at 08/06/2022 1404 Last data filed at 08/06/2022 0940 Gross per 24 hour  Intake 1802 ml  Output 1250 ml  Net 552 ml   Exam Gen:- Awake Alert, in no acute distress HEENT:- [place].AT, No sclera icterus Neck-Supple Neck,No JVD,.  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 extremity/wrist and hand -No additional new acute focal deficits at this time   Data Review   CBC w Diff:   Lab Results  Component Value Date   WBC 3.1 (L) 08/05/2022   HGB 9.4 (L) 08/05/2022   HCT 28.1 (L) 08/05/2022   PLT 143 (L) 08/05/2022   LYMPHOPCT 34 11/29/2021   MONOPCT 6 11/29/2021   EOSPCT 1 11/29/2021   BASOPCT 0 11/29/2021    CMP:  Lab Results  Component Value Date   NA 139 08/06/2022   K 3.7 08/06/2022   CL 106 08/06/2022   CO2 25 08/06/2022   BUN 5 (L) 08/06/2022   CREATININE 0.59 08/06/2022   CREATININE 0.70 08/15/2020   PROT 6.2 (L) 08/06/2022   ALBUMIN 2.6 (L) 08/06/2022   BILITOT 0.7 08/06/2022   ALKPHOS 106 08/06/2022   AST 153 (H) 08/06/2022   ALT 96 (H) 08/06/2022  .  Total Discharge time is about 33 minutes  Shon Hale M.D on 08/06/2022 at 2:04 PM  Go to [email or web address] -  for contact info  [facility] - Office  [phone]

## 2022-08-06 NOTE — Care Management Important Message (Signed)
Important Message  Patient Details  Name: [name] MRN: [id number] Date of Birth: 07-21-1956   Medicare Important Message Given:  Yes     Tommy Medal 08/06/2022, 2:32 PM

## 2022-08-06 NOTE — Progress Notes (Signed)
Pt discharged via WC to POV. Discharge instructions reviewed with pt and with Levie Heritage, owner of care home. Both state understanding. Pt's meds returned to pt from pharmacy.

## 2022-08-06 NOTE — Progress Notes (Signed)
Vital signs stable. Patient rested throughout the night. Patient had large soft BM. CIWA score remains low and no prn medication given.

## 2022-08-19 ENCOUNTER — Emergency Department (HOSPITAL_COMMUNITY)
Admission: EM | Admit: 2022-08-19 | Discharge: 2022-08-20 | Disposition: A | Discharge status: Home / Self Care | Payer: 59 | Attending: Emergency Medicine | Admitting: Emergency Medicine

## 2022-08-19 DIAGNOSIS — Y906 Blood alcohol level of 120-199 mg/100 ml: Secondary | ICD-10-CM | POA: Insufficient documentation

## 2022-08-19 DIAGNOSIS — G8929 Other chronic pain: Secondary | ICD-10-CM | POA: Insufficient documentation

## 2022-08-19 DIAGNOSIS — R1084 Generalized abdominal pain: Secondary | ICD-10-CM | POA: Insufficient documentation

## 2022-08-19 DIAGNOSIS — F1010 Alcohol abuse, uncomplicated: Secondary | ICD-10-CM | POA: Insufficient documentation

## 2022-08-19 DIAGNOSIS — E876 Hypokalemia: Secondary | ICD-10-CM | POA: Diagnosis not present

## 2022-08-20 ENCOUNTER — Encounter (HOSPITAL_COMMUNITY): Payer: Self-pay | Admitting: Emergency Medicine

## 2022-08-20 DIAGNOSIS — R1084 Generalized abdominal pain: Secondary | ICD-10-CM | POA: Diagnosis not present

## 2022-08-20 LAB — CBC WITH DIFFERENTIAL/PLATELET
Abs Immature Granulocytes: 0.01 10*3/uL (ref 0.00–0.07)
Basophils Absolute: 0 10*3/uL (ref 0.0–0.1)
Basophils Relative: 1 %
Eosinophils Absolute: 0.2 10*3/uL (ref 0.0–0.5)
Eosinophils Relative: 4 %
HCT: 32.9 % — ABNORMAL LOW (ref 36.0–46.0)
Hemoglobin: 10.9 g/dL — ABNORMAL LOW (ref 12.0–15.0)
Immature Granulocytes: 0 %
Lymphocytes Relative: 39 %
Lymphs Abs: 1.8 10*3/uL (ref 0.7–4.0)
MCH: 28.5 pg (ref 26.0–34.0)
MCHC: 33.1 g/dL (ref 30.0–36.0)
MCV: 85.9 fL (ref 80.0–100.0)
Monocytes Absolute: 0.4 10*3/uL (ref 0.1–1.0)
Monocytes Relative: 9 %
Neutro Abs: 2.2 10*3/uL (ref 1.7–7.7)
Neutrophils Relative %: 47 %
Platelets: 178 10*3/uL (ref 150–400)
RBC: 3.83 MIL/uL — ABNORMAL LOW (ref 3.87–5.11)
RDW: 17.5 % — ABNORMAL HIGH (ref 11.5–15.5)
WBC: 4.6 10*3/uL (ref 4.0–10.5)
nRBC: 0 % (ref 0.0–0.2)

## 2022-08-20 LAB — COMPREHENSIVE METABOLIC PANEL
ALT: 28 U/L (ref 0–44)
AST: 76 U/L — ABNORMAL HIGH (ref 15–41)
Albumin: 4 g/dL (ref 3.5–5.0)
Alkaline Phosphatase: 144 U/L — ABNORMAL HIGH (ref 38–126)
Anion gap: 14 (ref 5–15)
BUN: <5 mg/dL — ABNORMAL LOW (ref 8–23)
CO2: 21 mmol/L — ABNORMAL LOW (ref 22–32)
Calcium: 8.7 mg/dL — ABNORMAL LOW (ref 8.9–10.3)
Chloride: 95 mmol/L — ABNORMAL LOW (ref 98–111)
Creatinine, Ser: 0.63 mg/dL (ref 0.44–1.00)
GFR, Estimated: >60 mL/min (ref >60)
Glucose, Bld: 107 mg/dL — ABNORMAL HIGH (ref 70–99)
Potassium: 2.7 mmol/L — CL (ref 3.5–5.1)
Sodium: 130 mmol/L — ABNORMAL LOW (ref 135–145)
Total Bilirubin: 0.7 mg/dL (ref 0.3–1.2)
Total Protein: 9.1 g/dL — ABNORMAL HIGH (ref 6.5–8.1)

## 2022-08-20 LAB — ETHANOL: Alcohol, Ethyl (B): 173 mg/dL — ABNORMAL HIGH (ref <10)

## 2022-08-20 LAB — MAGNESIUM: Magnesium: 1.9 mg/dL (ref 1.7–2.4)

## 2022-08-20 LAB — LIPASE, BLOOD: Lipase: 36 U/L (ref 11–51)

## 2022-08-20 MED ORDER — PANTOPRAZOLE SODIUM 40 MG IV SOLR
40.0000 mg | Freq: Once | INTRAVENOUS | Status: AC
  Administered 2022-08-20: 40 mg via INTRAVENOUS
  Filled 2022-08-20: qty 10

## 2022-08-20 MED ORDER — POTASSIUM CHLORIDE 10 MEQ/100ML IV SOLN
10.0000 meq | INTRAVENOUS | Status: AC
  Administered 2022-08-20 (×2): 10 meq via INTRAVENOUS
  Filled 2022-08-20 (×2): qty 100

## 2022-08-20 MED ORDER — POTASSIUM CHLORIDE CRYS ER 20 MEQ PO TBCR
40.0000 meq | EXTENDED_RELEASE_TABLET | Freq: Once | ORAL | Status: AC
  Administered 2022-08-20: 40 meq via ORAL
  Filled 2022-08-20: qty 2

## 2022-08-20 NOTE — ED Notes (Signed)
Pt given money from security, verified by this RN and Simona Huh as [phone]; pt assisted into blue scrubs and wheelchair and taken out and helped into van; pt given referrals and info for outpatient alcohol assistance

## 2022-08-20 NOTE — ED Notes (Signed)
Called and spoke with Armando Reichert, pt's POA and informed her pt needed to be picked up due to being up for discharge;  she stated she would like to speak to social worker about getting pt help with her alcohol.  I informed her I would get her name and number sent to the right person, Aldona Bar with SW was sent message;  Pieter Partridge stated she would get pt's boyfriend to come pick her up  Went in to give pt an update and let her know her boyfriend is on the way; pt agreeable

## 2022-08-20 NOTE — ED Notes (Signed)
Second RN unable to start an IV. Another RN to look via [place]

## 2022-08-20 NOTE — ED Notes (Signed)
This RN attempted to start an IV unsuccessfully. Will get another RN to try

## 2022-08-20 NOTE — ED Provider Notes (Signed)
[facility] EMERGENCY DEPARTMENT  Provider Note  CSN: [id number] Arrival date & time: 08/19/22 2356  History Chief Complaint  Patient presents with   Abdominal Pain    [name] is a 66 y.o. female with history of remote stroke, alcohol abuse, frequent ED visits for abdominal pain brought by EMS who report the patient has had several hours of diffuse abdominal pain, she also reports a sore throat. Denies fever. No vomiting or diarrhea. Has been drinking beer tonight.    Home Medications Prior to Admission medications   Medication Sig Start Date End Date Taking? Authorizing Provider  albuterol (VENTOLIN HFA) 108 (90 Base) MCG/ACT inhaler Inhale 2 puffs into the lungs every 6 (six) hours as needed for wheezing or shortness of breath. 08/06/22   Roxan Hockey, MD  folic acid (FOLVITE) 413 MCG tablet Take 400 mcg by mouth daily.    [provider]  HYDROcodone-acetaminophen (NORCO/VICODIN) 5-325 MG tablet One tablet every six hours for pain.  Limit 7 days. 06/29/22   Sanjuana Kava, MD  lactulose (CHRONULAC) 10 GM/15ML solution Take 15 mLs (10 g total) by mouth daily. 08/06/22   Roxan Hockey, MD  LINZESS 290 MCG CAPS capsule TAKE (1) CAPSULE BY MOUTH EVERY DAY. 10/26/21   Erenest Rasher, PA-C  Multiple Vitamin (MULTIVITAMIN WITH MINERALS) TABS tablet Take 1 tablet by mouth daily. 08/06/22   Roxan Hockey, MD  pantoprazole (PROTONIX) 40 MG tablet Take 1 tablet (40 mg total) by mouth daily. 08/06/22   Roxan Hockey, MD  PHENobarbital (LUMINAL) 32.4 MG tablet Take 1 tablet (32.4 mg total) by mouth 2 (two) times daily. 08/06/22   Roxan Hockey, MD  phenytoin (DILANTIN) 200 MG ER capsule Take 1 capsule (200 mg total) by mouth 2 (two) times daily. 08/06/22   Roxan Hockey, MD  potassium chloride SA (KLOR-CON M) 20 MEQ tablet Take 1 tablet (20 mEq total) by mouth daily. 08/06/22   Roxan Hockey, MD  sucralfate (CARAFATE) 1 GM/10ML suspension Take 10 mLs (1 g  total) by mouth 4 (four) times daily -  with meals and at bedtime. 08/06/22   Roxan Hockey, MD  thiamine (VITAMIN B-1) 100 MG tablet Take 1 tablet (100 mg total) by mouth daily. 08/07/22   Roxan Hockey, MD     Allergies    Patient has no known allergies.   Review of Systems   Review of Systems Please see HPI for pertinent positives and negatives  Physical Exam BP 123/64   Pulse 92   Temp 97.7 F (36.5 C) (Oral)   Resp 20   Ht '5\' 4"'$  (1.626 m)   Wt 62.9 kg   SpO2 100%   BMI 23.80 kg/m   Physical Exam Vitals and nursing note reviewed.  Constitutional:      Appearance: Normal appearance.  HENT:     Head: Normocephalic and atraumatic.     Nose: Nose normal.     Mouth/Throat:     Mouth: Mucous membranes are moist.     Pharynx: No oropharyngeal exudate or posterior oropharyngeal erythema.  Eyes:     Extraocular Movements: Extraocular movements intact.     Conjunctiva/sclera: Conjunctivae normal.  Cardiovascular:     Rate and Rhythm: Normal rate.  Pulmonary:     Effort: Pulmonary effort is normal.     Breath sounds: Normal breath sounds.  Abdominal:     General: Abdomen is flat.     Palpations: Abdomen is soft.     Tenderness: There is generalized abdominal  tenderness. There is no guarding.  Musculoskeletal:        General: No swelling. Normal range of motion.     Cervical back: Neck supple.  Skin:    General: Skin is warm and dry.  Neurological:     General: No focal deficit present.     Mental Status: She is alert.     Comments: Contracture of RUE from remote stroke.   Psychiatric:        Mood and Affect: Mood normal.     ED Results / Procedures / Treatments   EKG None  Procedures Procedures  Medications Ordered in the ED Medications  potassium chloride SA (KLOR-CON M) CR tablet 40 mEq (40 mEq Oral Given 08/20/22 0223)  potassium chloride 10 mEq in 100 mL IVPB (0 mEq Intravenous Stopped 08/20/22 0525)  pantoprazole (PROTONIX) injection 40 mg (40  mg Intravenous Given 08/20/22 0301)    Initial Impression and Plan  Patient with recurrent abdominal pain in setting of EtOH use. She appears comfortable, no vomiting, abdomen is benign. Will check labs and reassess.   ED Course   Clinical Course as of 08/20/22 0614  Fri Aug 20, 2022  0204 CBC with anemia, at baseline. CMP with mild hyponatremia, similar to previous. K is low. Mg is normal. Lipase is normal. Will give K repletion. [CS]  0370 Patient feeling better, tolerating PO. She was advised to taper her alcohol use. Continue her home meds, and follow up with PCP.  [CS]    Clinical Course User Index [CS] Truddie Hidden, MD     MDM Rules/Calculators/A&P Medical Decision Making Given presenting complaint, I considered that admission might be necessary. After review of results from ED lab and/or imaging studies, admission to the hospital is not indicated at this time.    Problems Addressed: Alcohol abuse: chronic illness or injury with exacerbation, progression, or side effects of treatment Generalized abdominal pain: chronic illness or injury with exacerbation, progression, or side effects of treatment Hypokalemia: chronic illness or injury with exacerbation, progression, or side effects of treatment  Amount and/or Complexity of Data Reviewed Labs: ordered. Decision-making details documented in ED Course.  Risk Prescription drug management. Decision regarding hospitalization.    Final Clinical Impression(s) / ED Diagnoses Final diagnoses:  Generalized abdominal pain  Alcohol abuse  Hypokalemia    Rx / DC Orders ED Discharge Orders     None        Truddie Hidden, MD 08/20/22 [phone]

## 2022-08-20 NOTE — ED Triage Notes (Signed)
Pt BIB [facility] c/o abd pain and sore throat. Pt admits to ETOH abuse today.

## 2022-08-20 NOTE — ED Notes (Signed)
Hassan Rowan, pt's sister, is going to pick up pt. This RN spoke to [place] earlier discussing pt's status and that pt would be discharged this morning.  This RN called Hassan Rowan and the call went straight to voicemail at this time

## 2022-08-20 NOTE — ED Notes (Signed)
Lab at bedside

## 2022-08-20 NOTE — ED Notes (Signed)
CSW spoke with pts sister/POA at request of RN. Pts sister interested in substance use resources for pt. CSW explained that resources will be placed with pts D/C paperwork, sister agreeable to this. CSW provided substance use resources to pts RN to place with D/C paperwork. TOC signing off.

## 2022-08-20 NOTE — ED Notes (Signed)
Pt given lunch bag to eat and sprite

## 2022-08-20 NOTE — ED Notes (Signed)
ED Provider at bedside. 

## 2022-08-28 NOTE — Patient Instructions (Incomplete)
   Cirrhosis education:  High-protein diet from a primarily plant-based diet. Avoid red meat.  No raw or undercooked meat, seafood, or shellfish. Low-fat/cholesterol/carbohydrate diet. Limit sodium to no more than 2000 mg/day including everything that you eat and drink. Recommend at least 30 minutes of aerobic and resistance exercise 3 days/week.

## 2022-08-28 NOTE — Progress Notes (Unsigned)
GI Office Note    Referring Provider: Carrolyn Meiers* Primary Care Physician:  Carrolyn Meiers, MD Primary Gastroenterologist: Elon Alas. Abbey Chatters, DO   Date:  08/30/2022  ID:  [name], DOB 1956/01/10, MRN [id number]   Chief Complaint   Chief Complaint  Patient presents with   Abdominal Pain    Stomach hurts all the time all over.     History of Present Illness  [name] is a 66 y.o. female with a history of alcoholic cirrhosis, anemia, reflux esophagitis, HTN, peptic ulcer disease, seizures, stroke, GERD and gallbladder polyp presenting today with complaint of abdominal pain.   Last colonoscopy in December 2013 with moderate diverticulosis noted throughout the entire colon, small internal hemorrhoids, otherwise normal.  Advise repeat in 10 years.  Last EGD in March 2019 with reflux esophagitis, medium size hiatal hernia.  Last office visit 01/07/2021.  Presented to discuss rescheduling her colonoscopy.  Had 2 previous failed attempts prior to this.  Had a recent hospitalization earlier that month for confusion, nausea and vomiting.  Had positive alcohol level.  Negative CT head.  CT A/P with cirrhosis, unremarkable gallbladder, severe distal colonic diverticulosis.  Labs are stable with hemoglobin 9-10 range.  At time of visit patient reported a good appetite, denied reflux.  Reported centralized abdominal pain worsened with meals.  When asked some improvement of pain with bowel movements.  On Linzess daily.  Denies melena or BRBPR.  Caregiver reported possible drinking after she leaves for her shift.  Alcohol cessation recommended.  Colonoscopy scheduled.  Advised to continue pantoprazole 40 mg twice daily.  Since her last office visit she has had multiple ED visits with a couple of hospital admissions for abdominal pain, gastritis, and diverticulitis.  Most recent hospital admission 08/03/2022 for confusion and generalized weakness and abdominal  discomfort.  Caregiver reported patient continue to drink 6-8 beers per day.  CT head without any acute findings both evidence of atrophy, small vessel disease.  Previous right-sided healed craniotomy.  CT A/P without any acute intra-abdominal abnormalities but with evidence of cirrhosis.  RUQ ultrasound consistent with liver cirrhosis without any other acute findings.  Ammonia was 37.  Potassium 2.4, sodium 121, AST 742, ALT 219, creatinine 0.53, normal bilirubin and alk phos.  Negative viral hepatitis panel.  Mentation improved with lactulose.  INR was stable at 1.2-1.3.  Mild thrombocytopenia 130-143.  ED visit 08/19/2022 for abdominal pain. Suspected to be secondary to alcohol intake.  Lipase normal.  Normal magnesium.  Potassium 2.7, sodium 130, creatinine 0.63, AST 76, ALT 28, alk phos 144, bilirubin 0.7, albumin 4, ethanol 173, hemoglobin 10.9 (stable), platelets 178.  Potassium repleted with oral and IV.  Given PPI injection.  Again advised alcohol cessation.   Today: Cirrhosis history: Hematemesis/coffee ground emesis: None, no nausea/vomiting.  Bleeding episodes?: None, no melena or brbpr History of variceal bleeding:  Abdominal pain: yes - central and all over.  Abdominal distention/worsening ascites: Denies.  Fever/chills: None.  Episodes of confusion/disorientation: None since October.  Number of daily bowel movements: Once a day stools. (On Linzess and lactulose) Taking diuretics?: None  Date of last EGD: March 2019 Prior history of banding?:  No Prior episodes of SBP: No Last time liver imaging was performed: October 2023  MELD score: 10 (October 2023)  Having abdominal pain in the center of her abdomen that hurts all the time. 7/10 usually. No specific triggers. Drinks about 6 beers per day (12oz). Denies tobacco use. Not a  good appetite. Has lost some weight. Denies any more confusion at home since being discharged. Denies chest pain or shortness of breath. Gets meals from  meals on wheels. Eats chicken, green beans, corn on the cob. Drinks about 1 boost/ensure per day. Denise dysphagia. No recent seizures.    Current Outpatient Medications  Medication Sig Dispense Refill   acetaminophen (TYLENOL) 500 MG tablet Take 500 mg by mouth every 6 (six) hours as needed. As needed for pain     albuterol (VENTOLIN HFA) 108 (90 Base) MCG/ACT inhaler Inhale 2 puffs into the lungs every 6 (six) hours as needed for wheezing or shortness of breath. 18 g 2   folic acid (FOLVITE) 762 MCG tablet Take 400 mcg by mouth daily.     HYDROcodone-acetaminophen (NORCO/VICODIN) 5-325 MG tablet One tablet every six hours for pain.  Limit 7 days. 28 tablet 0   lactulose (CHRONULAC) 10 GM/15ML solution Take 15 mLs (10 g total) by mouth daily. 473 mL 0   LINZESS 290 MCG CAPS capsule TAKE (1) CAPSULE BY MOUTH EVERY DAY. 30 capsule 5   Multiple Vitamin (MULTIVITAMIN WITH MINERALS) TABS tablet Take 1 tablet by mouth daily. 100 tablet 1   pantoprazole (PROTONIX) 40 MG tablet Take 1 tablet (40 mg total) by mouth daily. 30 tablet 1   PHENobarbital (LUMINAL) 32.4 MG tablet Take 1 tablet (32.4 mg total) by mouth 2 (two) times daily. 180 tablet 1   phenytoin (DILANTIN) 200 MG ER capsule Take 1 capsule (200 mg total) by mouth 2 (two) times daily. 60 capsule 3   potassium chloride SA (KLOR-CON M) 20 MEQ tablet Take 1 tablet (20 mEq total) by mouth daily. 30 tablet 1   sucralfate (CARAFATE) 1 GM/10ML suspension Take 10 mLs (1 g total) by mouth 4 (four) times daily -  with meals and at bedtime. 420 mL 0   thiamine (VITAMIN B-1) 100 MG tablet Take 1 tablet (100 mg total) by mouth daily. 30 tablet 1   No current facility-administered medications for this visit.    Past Medical History:  Diagnosis Date   Alcohol abuse    Asthma    Contracture of muscle of hand    right   GERD (gastroesophageal reflux disease)    HTN (hypertension)    PUD (peptic ulcer disease)    remote   Seizures ([place])    since  stroke, no recent seizures   Sleep apnea    Stroke [facility])    age 89, required brain surgery effected right side    Past Surgical History:  Procedure Laterality Date   ABDOMINAL HYSTERECTOMY  2004   complete   BRAIN SURGERY     age 42 stroke   COLONOSCOPY     COLONOSCOPY WITH PROPOFOL  10/03/2012   Dr. [name]: moderate diverticulosis, small internal hemorrhoids, TUBULAR ADENOMA. Next colonoscopy 2023 per Dr. [name].   ESOPHAGOGASTRODUODENOSCOPY N/A 10/18/2014   Dr. Gala Romney during inpatient hospitalization: severe exudative/erosive reflux esophagitis as source of trivial upper GI bleed. No varices    ESOPHAGOGASTRODUODENOSCOPY (EGD) WITH PROPOFOL  10/03/2012   Dr. [name]: stricture at Wilmer junction s/p dilation, mild gastritis negative H.pylori    ESOPHAGOGASTRODUODENOSCOPY (EGD) WITH PROPOFOL N/A 12/27/2017   Grade D esophagitis. Medium-sized hiatal hernia, normal duodenum   right tib-fib fracture  2008   SAVORY DILATION  10/03/2012   Procedure: SAVORY DILATION;  Surgeon: Danie Binder, MD;  Location: [facility];  Service: Endoscopy;  Laterality: N/A;  started at 1303,  dilated 12.8-16mm  Family History  Problem Relation Age of Onset   Liver disease Sister        etoh   Colon cancer Neg Hx     Allergies as of 08/30/2022   (No Known Allergies)    Social History   Socioeconomic History   Marital status: Widowed    Spouse name: Not on file   Number of children: 3   Years of education: Not on file   Highest education level: Not on file  Occupational History   Occupation: disability    Employer: UNEMPLOYED  Tobacco Use   Smoking status: Former    Packs/day: 0.04    Years: 9.00    Total pack years: 0.36    Types: Cigarettes    Quit date: 07/28/2014    Years since quitting: 8.0   Smokeless tobacco: Never  Vaping Use   Vaping Use: Never used  Substance and Sexual Activity   Alcohol use: Yes    Alcohol/week: 35.0 standard drinks of alcohol    Types: 35 Cans of beer per week     Comment: Patient reports she drinks "a lot" of beer each day; 01/07/21 "1 or 2 at night"   Drug use: No   Sexual activity: Yes    Birth control/protection: Surgical  Other Topics Concern   Not on file  Social History Narrative   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 HAS 3 KIDS: ONE IN [place], ONE IN [place], AND ONE OOT.   Social Determinants of Health   Financial Resource Strain: Not on file  Food Insecurity: Not on file  Transportation Needs: Not on file  Physical Activity: Not on file  Stress: Not on file  Social Connections: Not on file     Review of Systems   Gen: Denies fever, chills, anorexia. Denies fatigue, weakness, weight loss.  CV: Denies chest pain, palpitations, syncope, peripheral edema, and claudication. Resp: Denies dyspnea at rest, cough, wheezing, coughing up blood, and pleurisy. GI: See HPI Derm: Denies rash, itching, dry skin Psych: Denies depression, anxiety, memory loss, confusion. No homicidal or suicidal ideation.  Heme: Denies bruising, bleeding, and enlarged lymph nodes.   Physical Exam   BP (!) 161/89 (BP Location: Left Arm, Patient Position: Sitting, Cuff Size: Normal)   Pulse 96   Temp 97.6 F (36.4 C) (Temporal)   Ht _0  (1.6 m)   Wt 140 lb 9.6 oz (63.8 kg)   SpO2 98%   BMI 24.91 kg/m   General:   Alert and oriented. No distress noted. Pleasant and cooperative.  Head:  Normocephalic and atraumatic. Eyes: Mild icterus.  Mouth:  Oral mucosa pink and moist. Good dentition. No lesions. Lungs:  Clear to auscultation bilaterally. No wheezes, rales, or rhonchi. No distress.  Heart:  S1, S2 present without murmurs appreciated.  Abdomen:  +BS, soft, non-distended. Tenderness to mid epigastric and periumbilical region. No rebound or guarding. No HSM or masses noted. Exam limited as patient is in wheelchair Rectal: deferred Msk: Contracture to right hand/wrist.  Extremities:  Without edema. Neurologic:  Alert and  oriented x4. No asterixis present  with left hand.  Psych:  Alert and cooperative. Normal mood and affect.   Assessment  [name] is a 66 y.o. female with a history of alcoholic cirrhosis, anemia, reflux esophagitis, HTN, peptic ulcer disease, seizures, stroke, GERD and gallbladder polyp presenting today with complaint of abdominal pain.   Alcoholic cirrhosis: Continued alcohol consumption with about 6 (12 ounce) beers per day.  Up-to-date on hepatoma screening, recent right upper quadrant ultrasound and CT abdomen pelvis with nodular liver surface, no evidence of liver lesion.  MELD Na 10 during hospitalization in October.  Recent hospital admission with weakness and confusion with ammonia level 37.  Patient's mentation improved with lactulose.  No asterixis today. We will plan to update labs and ultrasound in February 2024.  Complete alcohol cessation recommended and discussed extensively with patient today.  Diet and other precautions also discussed today.  Given her history of hepatic encephalopathy and still with only 1 bowel movement per day, will increase lactulose to 10 g twice daily.  Recommended follow-up in February.  Last EGD in 2019, due for esophageal variceal screening.  History of thrombocytopenia.  Also with centralized abdominal pain.  Abdominal pain: Mostly centralized.  Multiple ED visits and hospital admissions in the past.  Recent CT in October without any acute intra-abdominal abnormalities.  Did have mid epigastric and mid abdominal tenderness on exam today.  Suspect likely alcoholic gastritis.  We will further evaluate with EGD given need for esophageal variceal screening as well.  Advise avoiding fatty/greasy foods and avoidance of NSAIDs.  Screening for colon cancer: Last colonoscopy in December 2013 with moderate diverticulosis throughout the entire colon.  Denies any family history of colon cancer or colon polyps.  Currently due for screening, will perform concurrently at time of EGD.  PLAN    Alcohol cessation Continue pantoprazole 40 mg daily Continue Linzess 290 mcg daily Increase lactulose, take 10 g (15 mls) twice daily.  Continue 1 boost/ensure per day, consider increasing to 2/day. Avoid red meat. Avoid raw or undercooked meat, seafood, shellfish. Avoid NSAIDs. Follow heart healthy, 2 g sodium diet. Limit Tylenol to no more than 2000 mg/day. CBC, CMP, PT/INR, AFP in February 2024 RUQ U/S in February 2024 Proceed with upper endoscopy and colonoscopy with propofol by Dr. Abbey Chatters in near future: the risks, benefits, and alternatives have been discussed with the patient in detail. The patient states understanding and desires to proceed.  ASA 3    Venetia Night, MSN, FNP-BC, AGACNP-BC [facility]

## 2022-08-30 ENCOUNTER — Encounter: Payer: Self-pay | Admitting: Gastroenterology

## 2022-08-30 ENCOUNTER — Encounter: Payer: Self-pay | Admitting: *Deleted

## 2022-08-30 ENCOUNTER — Other Ambulatory Visit: Payer: Self-pay | Admitting: *Deleted

## 2022-08-30 ENCOUNTER — Telehealth: Payer: Self-pay | Admitting: *Deleted

## 2022-08-30 ENCOUNTER — Ambulatory Visit (INDEPENDENT_AMBULATORY_CARE_PROVIDER_SITE_OTHER): Payer: 59 | Admitting: Gastroenterology

## 2022-08-30 VITALS — BP 161/89 | HR 96 | Temp 97.6°F | Ht 63.0 in | Wt 140.6 lb

## 2022-08-30 DIAGNOSIS — K2100 Gastro-esophageal reflux disease with esophagitis, without bleeding: Secondary | ICD-10-CM

## 2022-08-30 DIAGNOSIS — K7030 Alcoholic cirrhosis of liver without ascites: Secondary | ICD-10-CM

## 2022-08-30 DIAGNOSIS — R1084 Generalized abdominal pain: Secondary | ICD-10-CM

## 2022-08-30 DIAGNOSIS — K7682 Hepatic encephalopathy: Secondary | ICD-10-CM

## 2022-08-30 DIAGNOSIS — Z1211 Encounter for screening for malignant neoplasm of colon: Secondary | ICD-10-CM | POA: Diagnosis not present

## 2022-08-30 DIAGNOSIS — R7989 Other specified abnormal findings of blood chemistry: Secondary | ICD-10-CM

## 2022-08-30 MED ORDER — LACTULOSE 10 GM/15ML PO SOLN: 10.0000 g | Freq: Every day | ORAL | 3 refills | Status: DC

## 2022-08-30 MED ORDER — PEG 3350-KCL-NA BICARB-NACL 420 G PO SOLR: 4000.0000 mL | Freq: Once | ORAL | 0 refills | Status: AC

## 2022-08-30 NOTE — Telephone Encounter (Signed)
PA: APPROVED Authorization #: [id number]  DOS: 09/27/22-12/26/22

## 2022-08-31 ENCOUNTER — Encounter: Payer: Self-pay | Admitting: Orthopaedic Surgery

## 2022-08-31 ENCOUNTER — Ambulatory Visit (INDEPENDENT_AMBULATORY_CARE_PROVIDER_SITE_OTHER): Payer: 59 | Admitting: Orthopaedic Surgery

## 2022-08-31 DIAGNOSIS — R531 Weakness: Secondary | ICD-10-CM

## 2022-08-31 DIAGNOSIS — G8929 Other chronic pain: Secondary | ICD-10-CM

## 2022-08-31 DIAGNOSIS — M25561 Pain in right knee: Secondary | ICD-10-CM | POA: Diagnosis not present

## 2022-08-31 MED ORDER — METHYLPREDNISOLONE ACETATE 40 MG/ML IJ SUSP
40.0000 mg | Freq: Once | INTRAMUSCULAR | Status: AC
  Administered 2022-08-31: 40 mg via INTRA_ARTICULAR

## 2022-08-31 MED ORDER — HYDROCODONE-ACETAMINOPHEN 5-325 MG PO TABS: ORAL_TABLET | ORAL | 0 refills | Status: DC

## 2022-08-31 NOTE — Progress Notes (Signed)
PROCEDURE NOTE:  The patient requests injections of the right knee , verbal consent was obtained.  The right knee was prepped appropriately after time out was performed.   Sterile technique was observed and injection of 1 cc of DepoMedrol '40mg'$  with several cc's of plain xylocaine. Anesthesia was provided by ethyl chloride and a 20-gauge needle was used to inject the knee area. The injection was tolerated well.  A band aid dressing was applied.  The patient was advised to apply ice later today and tomorrow to the injection sight as needed. Encounter Diagnoses  Name Primary?   Chronic pain of right knee Yes   Right sided weakness    Return in two months.  I have reviewed the [place] web site prior to prescribing narcotic medicine for this patient.  Call if any problem.  Precautions discussed.  Electronically Signed Sanjuana Kava, MD 11/14/20239:28 AM

## 2022-08-31 NOTE — Addendum Note (Signed)
Addended by: Obie Dredge A on: 08/31/2022 11:47 AM   Modules accepted: Orders

## 2022-09-22 NOTE — Patient Instructions (Signed)
RAESHA COONROD  09/22/2022     '@PREFPERIOPPHARMACY'$ @   Your procedure is scheduled on  09/27/2022.   Report to [facility] at  0700  A.M.   Call this number if you have problems the morning of surgery:  [phone]  If you experience any cold or flu symptoms such as cough, fever, chills, shortness of breath, etc. between now and your scheduled surgery, please notify us at the above number.   Remember:  Follow the diet and prep instructions given to you by the office.     Use your inhaler before you come and bring your rescue inhaler with you.     Take these medicines the morning of surgery with A SIP OF WATER         hydrocodone(if needed), pantoprazole, phenobarbital, phenytoin.     Do not wear jewelry, make-up or nail polish.  Do not wear lotions, powders, or perfumes, or deodorant.  Do not shave 48 hours prior to surgery.  Men may shave face and neck.  Do not bring valuables to the hospital.  [facility] is not responsible for any belongings or valuables.  Contacts, dentures or bridgework may not be worn into surgery.  Leave your suitcase in the car.  After surgery it may be brought to your room.  For patients admitted to the hospital, discharge time will be determined by your treatment team.  Patients discharged the day of surgery will not be allowed to drive home and must have someone with them for 24 hours.    Special instructions:   DO NOT smoke tobacco or vape for 24 hours before your procedure.  Please read over the following fact sheets that you were given. Anesthesia Post-op Instructions and Care and Recovery After Surgery      Upper Endoscopy, Adult, Care After After the procedure, it is common to have a sore throat. It is also common to have: Mild stomach pain or discomfort. Bloating. Nausea. Follow these instructions at home: The instructions below may help you care for yourself at home. Your health care provider may give you more  instructions. If you have questions, ask your health care provider. If you were given a sedative during the procedure, it can affect you for several hours. Do not drive or operate machinery until your health care provider says that it is safe. If you will be going home right after the procedure, plan to have a responsible adult: Take you home from the hospital or clinic. You will not be allowed to drive. Care for you for the time you are told. Follow instructions from your health care provider about what you may eat and drink. Return to your normal activities as told by your health care provider. Ask your health care provider what activities are safe for you. Take over-the-counter and prescription medicines only as told by your health care provider. Contact a health care provider if you: Have a sore throat that lasts longer than one day. Have trouble swallowing. Have a fever. Get help right away if you: Vomit blood or your vomit looks like coffee grounds. Have bloody, black, or tarry stools. Have a very bad sore throat or you cannot swallow. Have difficulty breathing or very bad pain in your chest or abdomen. These symptoms may be an emergency. Get help right away. Call 911. Do not wait to see if the symptoms will go away. Do not drive yourself to the hospital. Summary After the procedure,  it is common to have a sore throat, mild stomach discomfort, bloating, and nausea. If you were given a sedative during the procedure, it can affect you for several hours. Do not drive until your health care provider says that it is safe. Follow instructions from your health care provider about what you may eat and drink. Return to your normal activities as told by your health care provider. This information is not intended to replace advice given to you by your health care provider. Make sure you discuss any questions you have with your health care provider. Document Revised: 01/13/2022 Document Reviewed:  01/13/2022 Elsevier Patient Education  [place]. Colonoscopy, Adult, Care After The following information offers guidance on how to care for yourself after your procedure. Your health care provider may also give you more specific instructions. If you have problems or questions, contact your health care provider. What can I expect after the procedure? After the procedure, it is common to have: A small amount of blood in your stool for 24 hours after the procedure. Some gas. Mild cramping or bloating of your abdomen. Follow these instructions at home: Eating and drinking  Drink enough fluid to keep your urine pale yellow. Follow instructions from your health care provider about eating or drinking restrictions. Resume your normal diet as told by your health care provider. Avoid heavy or fried foods that are hard to digest. Activity Rest as told by your health care provider. Avoid sitting for a long time without moving. Get up to take short walks every 1-2 hours. This is important to improve blood flow and breathing. Ask for help if you feel weak or unsteady. Return to your normal activities as told by your health care provider. Ask your health care provider what activities are safe for you. Managing cramping and bloating  Try walking around when you have cramps or feel bloated. If directed, apply heat to your abdomen as told by your health care provider. Use the heat source that your health care provider recommends, such as a moist heat pack or a heating pad. Place a towel between your skin and the heat source. Leave the heat on for 20-30 minutes. Remove the heat if your skin turns bright red. This is especially important if you are unable to feel pain, heat, or cold. You have a greater risk of getting burned. General instructions If you were given a sedative during the procedure, it can affect you for several hours. Do not drive or operate machinery until your health care provider  says that it is safe. For the first 24 hours after the procedure: Do not sign important documents. Do not drink alcohol. Do your regular daily activities at a slower pace than normal. Eat soft foods that are easy to digest. Take over-the-counter and prescription medicines only as told by your health care provider. Keep all follow-up visits. This is important. Contact a health care provider if: You have blood in your stool 2-3 days after the procedure. Get help right away if: You have more than a small spotting of blood in your stool. You have large blood clots in your stool. You have swelling of your abdomen. You have nausea or vomiting. You have a fever. You have increasing pain in your abdomen that is not relieved with medicine. These symptoms may be an emergency. Get help right away. Call 911. Do not wait to see if the symptoms will go away. Do not drive yourself to the hospital. Summary After the procedure,  it is common to have a small amount of blood in your stool. You may also have mild cramping and bloating of your abdomen. If you were given a sedative during the procedure, it can affect you for several hours. Do not drive or operate machinery until your health care provider says that it is safe. Get help right away if you have a lot of blood in your stool, nausea or vomiting, a fever, or increased pain in your abdomen. This information is not intended to replace advice given to you by your health care provider. Make sure you discuss any questions you have with your health care provider. Document Revised: 05/27/2021 Document Reviewed: 05/27/2021 Elsevier Patient Education  [place] After The following information offers guidance on how to care for yourself after your procedure. Your health care provider may also give you more specific instructions. If you have problems or questions, contact your health care provider. What can I expect  after the procedure? After the procedure, it is common to have: Tiredness. Little or no memory about what happened during or after the procedure. Impaired judgment when it comes to making decisions. Nausea or vomiting. Some trouble with balance. Follow these instructions at home: For the time period you were told by your health care provider:  Rest. Do not participate in activities where you could fall or become injured. Do not drive or use machinery. Do not drink alcohol. Do not take sleeping pills or medicines that cause drowsiness. Do not make important decisions or sign legal documents. Do not take care of children on your own. Medicines Take over-the-counter and prescription medicines only as told by your health care provider. If you were prescribed antibiotics, take them as told by your health care provider. Do not stop using the antibiotic even if you start to feel better. Eating and drinking Follow instructions from your health care provider about what you may eat and drink. Drink enough fluid to keep your urine pale yellow. If you vomit: Drink clear fluids slowly and in small amounts as you are able. Clear fluids include water, ice chips, low-calorie sports drinks, and fruit juice that has water added to it (diluted fruit juice). Eat light and bland foods in small amounts as you are able. These foods include bananas, applesauce, rice, lean meats, toast, and crackers. General instructions  Have a responsible adult stay with you for the time you are told. It is important to have someone help care for you until you are awake and alert. If you have sleep apnea, surgery and some medicines can increase your risk for breathing problems. Follow instructions from your health care provider about wearing your sleep device: When you are sleeping. This includes during daytime naps. While taking prescription pain medicines, sleeping medicines, or medicines that make you drowsy. Do not use  any products that contain nicotine or tobacco. These products include cigarettes, chewing tobacco, and vaping devices, such as e-cigarettes. If you need help quitting, ask your health care provider. Contact a health care provider if: You feel nauseous or vomit every time you eat or drink. You feel light-headed. You are still sleepy or having trouble with balance after 24 hours. You get a rash. You have a fever. You have redness or swelling around the IV site. Get help right away if: You have trouble breathing. You have new confusion after you get home. These symptoms may be an emergency. Get help right away. Call 911. Do not wait to  see if the symptoms will go away. Do not drive yourself to the hospital. This information is not intended to replace advice given to you by your health care provider. Make sure you discuss any questions you have with your health care provider. Document Revised: 03/01/2022 Document Reviewed: 03/01/2022 Elsevier Patient Education  [place].

## 2022-09-23 ENCOUNTER — Encounter (HOSPITAL_COMMUNITY)
Admission: RE | Admit: 2022-09-23 | Discharge: 2022-09-23 | Disposition: A | Discharge status: Home / Self Care | Payer: 59 | Source: Ambulatory Visit | Attending: Internal Medicine | Admitting: Internal Medicine

## 2022-09-23 ENCOUNTER — Encounter (HOSPITAL_COMMUNITY): Payer: Self-pay

## 2022-09-23 DIAGNOSIS — K7030 Alcoholic cirrhosis of liver without ascites: Secondary | ICD-10-CM | POA: Insufficient documentation

## 2022-09-23 DIAGNOSIS — E876 Hypokalemia: Secondary | ICD-10-CM | POA: Insufficient documentation

## 2022-09-23 DIAGNOSIS — Z01812 Encounter for preprocedural laboratory examination: Secondary | ICD-10-CM | POA: Insufficient documentation

## 2022-09-23 LAB — COMPREHENSIVE METABOLIC PANEL
ALT: 26 U/L (ref 0–44)
AST: 76 U/L — ABNORMAL HIGH (ref 15–41)
Albumin: 3.6 g/dL (ref 3.5–5.0)
Alkaline Phosphatase: 157 U/L — ABNORMAL HIGH (ref 38–126)
Anion gap: 12 (ref 5–15)
BUN: <5 mg/dL — ABNORMAL LOW (ref 8–23)
CO2: 21 mmol/L — ABNORMAL LOW (ref 22–32)
Calcium: 9 mg/dL (ref 8.9–10.3)
Chloride: 101 mmol/L (ref 98–111)
Creatinine, Ser: 0.59 mg/dL (ref 0.44–1.00)
GFR, Estimated: >60 mL/min (ref >60)
Glucose, Bld: 98 mg/dL (ref 70–99)
Potassium: 3.4 mmol/L — ABNORMAL LOW (ref 3.5–5.1)
Sodium: 134 mmol/L — ABNORMAL LOW (ref 135–145)
Total Bilirubin: 1 mg/dL (ref 0.3–1.2)
Total Protein: 7.9 g/dL (ref 6.5–8.1)

## 2022-09-23 LAB — PROTIME-INR
INR: 1.1 (ref 0.8–1.2)
Prothrombin Time: 14.4 seconds (ref 11.4–15.2)

## 2022-09-27 ENCOUNTER — Encounter: Payer: Self-pay | Admitting: *Deleted

## 2022-09-27 NOTE — OR Nursing (Signed)
Called [name] this morning due to not arrived at scheduled time. She stated that her procedure is tomorrow. And she was to start her prep today. Asked her to call office because she is not on our schedule for tomorrow.

## 2022-10-04 ENCOUNTER — Encounter (HOSPITAL_COMMUNITY)
Admission: RE | Admit: 2022-10-04 | Discharge: 2022-10-04 | Disposition: A | Discharge status: Home / Self Care | Payer: 59 | Source: Ambulatory Visit | Attending: Internal Medicine | Admitting: Internal Medicine

## 2022-10-04 ENCOUNTER — Encounter (HOSPITAL_COMMUNITY): Payer: Self-pay

## 2022-10-06 ENCOUNTER — Ambulatory Visit (HOSPITAL_COMMUNITY)
Admission: RE | Admit: 2022-10-06 | Discharge: 2022-10-06 | Disposition: A | Discharge status: Home / Self Care | Payer: 59 | Attending: Internal Medicine | Admitting: Internal Medicine

## 2022-10-06 ENCOUNTER — Ambulatory Visit (HOSPITAL_BASED_OUTPATIENT_CLINIC_OR_DEPARTMENT_OTHER): Payer: 59 | Admitting: Certified Registered"

## 2022-10-06 ENCOUNTER — Ambulatory Visit (HOSPITAL_COMMUNITY): Payer: 59 | Admitting: Certified Registered"

## 2022-10-06 ENCOUNTER — Encounter (HOSPITAL_COMMUNITY)
Admission: RE | Disposition: A | Discharge status: Home / Self Care | Payer: Self-pay | Source: Home / Self Care | Attending: Internal Medicine

## 2022-10-06 ENCOUNTER — Encounter (HOSPITAL_COMMUNITY): Payer: Self-pay

## 2022-10-06 DIAGNOSIS — D123 Benign neoplasm of transverse colon: Secondary | ICD-10-CM | POA: Insufficient documentation

## 2022-10-06 DIAGNOSIS — K208 Other esophagitis without bleeding: Secondary | ICD-10-CM | POA: Diagnosis not present

## 2022-10-06 DIAGNOSIS — K2100 Gastro-esophageal reflux disease with esophagitis, without bleeding: Secondary | ICD-10-CM | POA: Insufficient documentation

## 2022-10-06 DIAGNOSIS — K222 Esophageal obstruction: Secondary | ICD-10-CM | POA: Insufficient documentation

## 2022-10-06 DIAGNOSIS — D125 Benign neoplasm of sigmoid colon: Secondary | ICD-10-CM | POA: Insufficient documentation

## 2022-10-06 DIAGNOSIS — K449 Diaphragmatic hernia without obstruction or gangrene: Secondary | ICD-10-CM

## 2022-10-06 DIAGNOSIS — J45909 Unspecified asthma, uncomplicated: Secondary | ICD-10-CM | POA: Insufficient documentation

## 2022-10-06 DIAGNOSIS — K2970 Gastritis, unspecified, without bleeding: Secondary | ICD-10-CM | POA: Insufficient documentation

## 2022-10-06 DIAGNOSIS — K209 Esophagitis, unspecified without bleeding: Secondary | ICD-10-CM | POA: Diagnosis not present

## 2022-10-06 DIAGNOSIS — K5730 Diverticulosis of large intestine without perforation or abscess without bleeding: Secondary | ICD-10-CM | POA: Diagnosis not present

## 2022-10-06 DIAGNOSIS — I10 Essential (primary) hypertension: Secondary | ICD-10-CM | POA: Insufficient documentation

## 2022-10-06 DIAGNOSIS — G40909 Epilepsy, unspecified, not intractable, without status epilepticus: Secondary | ICD-10-CM | POA: Diagnosis not present

## 2022-10-06 DIAGNOSIS — G709 Myoneural disorder, unspecified: Secondary | ICD-10-CM | POA: Insufficient documentation

## 2022-10-06 DIAGNOSIS — N289 Disorder of kidney and ureter, unspecified: Secondary | ICD-10-CM | POA: Insufficient documentation

## 2022-10-06 DIAGNOSIS — G4730 Sleep apnea, unspecified: Secondary | ICD-10-CM | POA: Diagnosis not present

## 2022-10-06 DIAGNOSIS — Z79899 Other long term (current) drug therapy: Secondary | ICD-10-CM | POA: Insufficient documentation

## 2022-10-06 DIAGNOSIS — K3189 Other diseases of stomach and duodenum: Secondary | ICD-10-CM | POA: Insufficient documentation

## 2022-10-06 DIAGNOSIS — Z87891 Personal history of nicotine dependence: Secondary | ICD-10-CM | POA: Diagnosis not present

## 2022-10-06 DIAGNOSIS — Z1211 Encounter for screening for malignant neoplasm of colon: Secondary | ICD-10-CM | POA: Diagnosis present

## 2022-10-06 DIAGNOSIS — Z8711 Personal history of peptic ulcer disease: Secondary | ICD-10-CM | POA: Insufficient documentation

## 2022-10-06 HISTORY — PX: ESOPHAGOGASTRODUODENOSCOPY (EGD) WITH PROPOFOL: SHX5813

## 2022-10-06 HISTORY — PX: POLYPECTOMY: SHX5525

## 2022-10-06 HISTORY — PX: BIOPSY: SHX5522

## 2022-10-06 HISTORY — PX: COLONOSCOPY WITH PROPOFOL: SHX5780

## 2022-10-06 SURGERY — COLONOSCOPY WITH PROPOFOL
Anesthesia: General

## 2022-10-06 MED ORDER — LACTATED RINGERS IV SOLN: INTRAVENOUS | Status: DC | PRN

## 2022-10-06 MED ORDER — PHENYLEPHRINE 80 MCG/ML (10ML) SYRINGE FOR IV PUSH (FOR BLOOD PRESSURE SUPPORT)
PREFILLED_SYRINGE | INTRAVENOUS | Status: DC | PRN
  Administered 2022-10-06: 160 ug via INTRAVENOUS

## 2022-10-06 MED ORDER — PROPOFOL 500 MG/50ML IV EMUL
INTRAVENOUS | Status: DC | PRN
  Administered 2022-10-06: 200 ug/kg/min via INTRAVENOUS

## 2022-10-06 MED ORDER — LIDOCAINE HCL (CARDIAC) PF 100 MG/5ML IV SOSY
PREFILLED_SYRINGE | INTRAVENOUS | Status: DC | PRN
  Administered 2022-10-06: 50 mg via INTRATRACHEAL

## 2022-10-06 MED ORDER — PROPOFOL 10 MG/ML IV BOLUS
INTRAVENOUS | Status: DC | PRN
  Administered 2022-10-06: 50 mg via INTRAVENOUS

## 2022-10-06 MED ORDER — PANTOPRAZOLE SODIUM 40 MG PO TBEC: 40.0000 mg | DELAYED_RELEASE_TABLET | Freq: Two times a day (BID) | ORAL | 11 refills | Status: DC

## 2022-10-06 NOTE — Transfer of Care (Signed)
Immediate Anesthesia Transfer of Care Note  Patient: [name]  Procedure(s) Performed: COLONOSCOPY WITH PROPOFOL ESOPHAGOGASTRODUODENOSCOPY (EGD) WITH PROPOFOL BIOPSY POLYPECTOMY  Patient Location: Short Stay  Anesthesia Type:General  Level of Consciousness: awake, alert , oriented, and patient cooperative  Airway & Oxygen Therapy: Patient Spontanous Breathing  Post-op Assessment: Report given to RN, Post -op Vital signs reviewed and stable, and Patient moving all extremities  Post vital signs: Reviewed and stable  Last Vitals:  Vitals Value Taken Time  BP 108/64 10/06/22 1240  Temp 36.6 C 10/06/22 1240  Pulse 87 10/06/22 1240  Resp 20 10/06/22 1240  SpO2 100 % 10/06/22 1240    Last Pain:  Vitals:   10/06/22 1240  TempSrc: Oral  PainSc: 0-No pain      Patients Stated Pain Goal: 6 (47/99/80 0123)  Complications: No notable events documented.

## 2022-10-06 NOTE — Discharge Instructions (Signed)
EGD Discharge instructions Please read the instructions outlined below and refer to this sheet in the next few weeks. These discharge instructions provide you with general information on caring for yourself after you leave the hospital. Your doctor may also give you specific instructions. While your treatment has been planned according to the most current medical practices available, unavoidable complications occasionally occur. If you have any problems or questions after discharge, please call your doctor. ACTIVITY You may resume your regular activity but move at a slower pace for the next 24 hours.  Take frequent rest periods for the next 24 hours.  Walking will help expel (get rid of) the air and reduce the bloated feeling in your abdomen.  No driving for 24 hours (because of the anesthesia (medicine) used during the test).  You may shower.  Do not sign any important legal documents or operate any machinery for 24 hours (because of the anesthesia used during the test).  NUTRITION Drink plenty of fluids.  You may resume your normal diet.  Begin with a light meal and progress to your normal diet.  Avoid alcoholic beverages for 24 hours or as instructed by your caregiver.  MEDICATIONS You may resume your normal medications unless your caregiver tells you otherwise.  WHAT YOU CAN EXPECT TODAY You may experience abdominal discomfort such as a feeling of fullness or "gas" pains.  FOLLOW-UP Your doctor will discuss the results of your test with you.  SEEK IMMEDIATE MEDICAL ATTENTION IF ANY OF THE FOLLOWING OCCUR: Excessive nausea (feeling sick to your stomach) and/or vomiting.  Severe abdominal pain and distention (swelling).  Trouble swallowing.  Temperature over 101 F (37.8 C).  Rectal bleeding or vomiting of blood.     Colonoscopy Discharge Instructions  Read the instructions outlined below and refer to this sheet in the next few weeks. These discharge instructions provide you with  general information on caring for yourself after you leave the hospital. Your doctor may also give you specific instructions. While your treatment has been planned according to the most current medical practices available, unavoidable complications occasionally occur.   ACTIVITY You may resume your regular activity, but move at a slower pace for the next 24 hours.  Take frequent rest periods for the next 24 hours.  Walking will help get rid of the air and reduce the bloated feeling in your belly (abdomen).  No driving for 24 hours (because of the medicine (anesthesia) used during the test).   Do not sign any important legal documents or operate any machinery for 24 hours (because of the anesthesia used during the test).  NUTRITION Drink plenty of fluids.  You may resume your normal diet as instructed by your doctor.  Begin with a light meal and progress to your normal diet. Heavy or fried foods are harder to digest and may make you feel sick to your stomach (nauseated).  Avoid alcoholic beverages for 24 hours or as instructed.  MEDICATIONS You may resume your normal medications unless your doctor tells you otherwise.  WHAT YOU CAN EXPECT TODAY Some feelings of bloating in the abdomen.  Passage of more gas than usual.  Spotting of blood in your stool or on the toilet paper.  IF YOU HAD POLYPS REMOVED DURING THE COLONOSCOPY: No aspirin products for 7 days or as instructed.  No alcohol for 7 days or as instructed.  Eat a soft diet for the next 24 hours.  FINDING OUT THE RESULTS OF YOUR TEST Not all test results are  available during your visit. If your test results are not back during the visit, make an appointment with your caregiver to find out the results. Do not assume everything is normal if you have not heard from your caregiver or the medical facility. It is important for you to follow up on all of your test results.  SEEK IMMEDIATE MEDICAL ATTENTION IF: You have more than a spotting of  blood in your stool.  Your belly is swollen (abdominal distention).  You are nauseated or vomiting.  You have a temperature over 101.  You have abdominal pain or discomfort that is severe or gets worse throughout the day.   Your EGD revealed mild amount inflammation in your stomach.  I took biopsies of this to rule out infection with a bacteria called H. pylori.  Await pathology results, my office will contact you.  You also had evidence of of moderate to severe esophagitis which is likely the cause of your epigastric pain.  I am going to increase your pantoprazole to twice daily.  Continue liquid Carafate.  Limit NSAID use.  Your colonoscopy revealed 2 polyp(s) which I removed successfully. Await pathology results, my office will contact you. I recommend repeating colonoscopy in 5 years for surveillance purposes.   You also have diverticulosis and internal hemorrhoids. I would recommend increasing fiber in your diet or adding OTC Benefiber/Metamucil. Be sure to drink at least 4 to 6 glasses of water daily.   Follow-up with GI in 3 months.   I hope you have a great rest of your week!  Elon Alas. Abbey Chatters, D.O. Gastroenterology and Hepatology [facility]

## 2022-10-06 NOTE — Op Note (Signed)
[facility] Patient Name: [name] Procedure Date: 10/06/2022 12:00 PM MRN: [id number] Date of Birth: [birth year] Attending MD: Elon Alas. Abbey Chatters , [place], [id number] CSN: [id number] Age: 66 Admit Type: Outpatient Procedure:                Upper GI endoscopy Indications:              Epigastric abdominal pain, Cirrhosis rule out                            esophageal varices Providers:                Elon Alas. Abbey Chatters, DO, Oceana Page, Everardo [place] Referring MD:             Elon Alas. Abbey Chatters, DO Medicines:                See the Anesthesia note for documentation of the                            administered medications Complications:            No immediate complications. Estimated Blood Loss:     Estimated blood loss was minimal. Procedure:                Pre-Anesthesia Assessment:                           - The anesthesia plan was to use monitored                            anesthesia care (MAC).                           After obtaining informed consent, the endoscope was                            passed under direct vision. Throughout the                            procedure, the patient's blood pressure, pulse, and                            oxygen saturations were monitored continuously. The                            [id number]-H190 ([id number]) scope was introduced through the                            mouth, and advanced to the second part of duodenum.                            The upper GI endoscopy was accomplished without                            difficulty. The patient tolerated  the procedure                            well. Scope In: 12:15:21 PM Scope Out: 12:18:34 PM Total Procedure Duration: 0 hours 3 minutes 13 seconds  Findings:      A 3 cm hiatal hernia was present.      LA Grade C (one or more mucosal breaks continuous between tops of 2 or       more mucosal folds, less than 75% circumference) esophagitis with no        bleeding was found in the distal esophagus.      A mild Schatzki ring was found in the distal esophagus.      Patchy mild inflammation characterized by erythema was found in the       stomach. Biopsies were taken with a cold forceps for Helicobacter pylori       testing.      The duodenal bulb, first portion of the duodenum and second portion of       the duodenum were normal.      There is no endoscopic evidence of varices in the entire esophagus. Impression:               - 3 cm hiatal hernia.                           - LA Grade C erosive esophagitis with no bleeding.                           - Mild Schatzki ring.                           - Gastritis. Biopsied.                           - Normal duodenal bulb, first portion of the                            duodenum and second portion of the duodenum. Moderate Sedation:      Per Anesthesia Care Recommendation:           - Patient has a contact number available for                            emergencies. The signs and symptoms of potential                            delayed complications were discussed with the                            patient. Return to normal activities tomorrow.                            Written discharge instructions were provided to the                            patient.                           -  Resume previous diet.                           - Continue present medications.                           - Await pathology results.                           - Use Protonix (pantoprazole) 40 mg PO BID.                           - No ibuprofen, naproxen, or other non-steroidal                            anti-inflammatory drugs.                           - Return to GI clinic in 3 months. Procedure Code(s):        --- Professional ---                           [phone], Esophagogastroduodenoscopy, flexible,                            transoral; with biopsy, single or multiple Diagnosis Code(s):        ---  Professional ---                           K44.9, Diaphragmatic hernia without obstruction or                            gangrene                           K20.80, Other esophagitis without bleeding                           K22.2, Esophageal obstruction                           K29.70, Gastritis, unspecified, without bleeding                           R10.13, Epigastric pain                           K74.60, Unspecified cirrhosis of liver CPT copyright 2022 American Medical Association. All rights reserved. The codes documented in this report are preliminary and upon coder review may  be revised to meet current compliance requirements. Elon Alas. Abbey Chatters, DO [place] Abbey Chatters, DO 10/06/2022 12:22:07 PM This report has been signed electronically. Number of Addenda: 0

## 2022-10-06 NOTE — Op Note (Signed)
[facility] Patient Name: [name] Procedure Date: 10/06/2022 12:20 PM MRN: [id number] Date of Birth: [birth year] Attending MD: Elon Alas. Abbey Chatters , [place], [id number] CSN: [id number] Age: 66 Admit Type: Outpatient Procedure:                Colonoscopy Indications:              Screening for colorectal malignant neoplasm Providers:                Elon Alas. Raquel James, RN, Everardo [place] Referring MD:             Elon Alas. Abbey Chatters, DO Medicines:                See the Anesthesia note for documentation of the                            administered medications Complications:            No immediate complications. Estimated Blood Loss:     Estimated blood loss was minimal. Procedure:                Pre-Anesthesia Assessment:                           - The anesthesia plan was to use monitored                            anesthesia care (MAC).                           After obtaining informed consent, the colonoscope                            was passed under direct vision. Throughout the                            procedure, the patient's blood pressure, pulse, and                            oxygen saturations were monitored continuously. The                            [id number]-HQ190L ([id number]) scope was introduced through                            the anus and advanced to the the cecum, identified                            by appendiceal orifice and ileocecal valve. The                            colonoscopy was performed without difficulty. The                            patient tolerated the procedure  well. The quality                            of the bowel preparation was evaluated using the                            BBPS [facility] Bowel Preparation Scale) with scores                            of: Right Colon = 3, Transverse Colon = 3 and Left                            Colon = 3 (entire mucosa seen well with no residual                             staining, small fragments of stool or opaque                            liquid). The total BBPS score equals 9. Scope In: 12:22:44 PM Scope Out: 12:32:54 PM Scope Withdrawal Time: 0 hours 7 minutes 34 seconds  Total Procedure Duration: 0 hours 10 minutes 10 seconds  Findings:      The perianal and digital rectal examinations were normal.      Many large-mouthed and small-mouthed diverticula were found in the       entire colon.      A 5 mm polyp was found in the transverse colon. The polyp was sessile.       The polyp was removed with a cold snare. Resection and retrieval were       complete.      A 4 mm polyp was found in the sigmoid colon. The polyp was sessile. The       polyp was removed with a cold snare. Resection and retrieval were       complete.      The exam was otherwise without abnormality. Impression:               - Diverticulosis in the entire examined colon.                           - One 5 mm polyp in the transverse colon, removed                            with a cold snare. Resected and retrieved.                           - One 4 mm polyp in the sigmoid colon, removed with                            a cold snare. Resected and retrieved.                           - The examination was otherwise normal. Moderate Sedation:      Per Anesthesia Care Recommendation:           - Patient has a contact number  available for                            emergencies. The signs and symptoms of potential                            delayed complications were discussed with the                            patient. Return to normal activities tomorrow.                            Written discharge instructions were provided to the                            patient.                           - Resume previous diet.                           - Continue present medications.                           - Await pathology results.                           - Repeat colonoscopy  in 5-10 years for surveillance.                           - Return to GI clinic in 3 months. Procedure Code(s):        --- Professional ---                           [phone], Colonoscopy, flexible; with removal of                            tumor(s), polyp(s), or other lesion(s) by snare                            technique Diagnosis Code(s):        --- Professional ---                           Z12.11, Encounter for screening for malignant                            neoplasm of colon                           D12.3, Benign neoplasm of transverse colon (hepatic                            flexure or splenic flexure)                           D12.5, Benign neoplasm of sigmoid colon  K57.30, Diverticulosis of large intestine without                            perforation or abscess without bleeding CPT copyright 2022 American Medical Association. All rights reserved. The codes documented in this report are preliminary and upon coder review may  be revised to meet current compliance requirements. Elon Alas. Abbey Chatters, DO [place] Abbey Chatters, DO 10/06/2022 12:34:57 PM This report has been signed electronically. Number of Addenda: 0

## 2022-10-06 NOTE — Anesthesia Preprocedure Evaluation (Addendum)
Anesthesia Evaluation  Patient identified by MRN, date of birth, ID band Patient awake and Patient confused    Reviewed: Allergy & Precautions, NPO status , Patient's Chart, lab work & pertinent test results  Airway Mallampati: II       Dental  (+) Poor Dentition   Pulmonary asthma , sleep apnea , former smoker          Cardiovascular hypertension, Normal cardiovascular exam     Neuro/Psych Seizures -, Well Controlled,   Neuromuscular disease CVA    GI/Hepatic PUD,GERD  ,,  Endo/Other    Renal/GU Renal disease     Musculoskeletal   Abdominal   Peds  Hematology  (+) Blood dyscrasia, anemia   Anesthesia Other Findings   Reproductive/Obstetrics                             Anesthesia Physical Anesthesia Plan  ASA: 3  Anesthesia Plan: General   Post-op Pain Management:    Induction: Intravenous  PONV Risk Score and Plan: Propofol infusion  Airway Management Planned: Nasal Cannula and Natural Airway  Additional Equipment:   Intra-op Plan:   Post-operative Plan:   Informed Consent:   Plan Discussed with: Anesthesiologist  Anesthesia Plan Comments:        Anesthesia Quick Evaluation

## 2022-10-06 NOTE — H&P (Signed)
Primary Care Physician:  Carrolyn Meiers, MD Primary Gastroenterologist:  Dr. Abbey Chatters  Pre-Procedure History & Physical: HPI:  [name] is a 66 y.o. female is here for an EGD to be performed for cirrhosis/variceal screening, abdominal pain, and colonoscopy for colon cancer screening purposes.  Past Medical History:  Diagnosis Date   Alcohol abuse    Asthma    Contracture of muscle of hand    right   GERD (gastroesophageal reflux disease)    HTN (hypertension)    PUD (peptic ulcer disease)    remote   Seizures ([place])    since stroke, no recent seizures   Sleep apnea    Stroke [facility])    age 64, required brain surgery effected right side    Past Surgical History:  Procedure Laterality Date   ABDOMINAL HYSTERECTOMY  2004   complete   BRAIN SURGERY     age 7 stroke   COLONOSCOPY     COLONOSCOPY WITH PROPOFOL  10/03/2012   Dr. [name]: moderate diverticulosis, small internal hemorrhoids, TUBULAR ADENOMA. Next colonoscopy 2023 per Dr. [name].   ESOPHAGOGASTRODUODENOSCOPY N/A 10/18/2014   Dr. Gala Romney during inpatient hospitalization: severe exudative/erosive reflux esophagitis as source of trivial upper GI bleed. No varices    ESOPHAGOGASTRODUODENOSCOPY (EGD) WITH PROPOFOL  10/03/2012   Dr. [name]: stricture at [place] junction s/p dilation, mild gastritis negative H.pylori    ESOPHAGOGASTRODUODENOSCOPY (EGD) WITH PROPOFOL N/A 12/27/2017   Grade D esophagitis. Medium-sized hiatal hernia, normal duodenum   right tib-fib fracture  2008   SAVORY DILATION  10/03/2012   Procedure: SAVORY DILATION;  Surgeon: Danie Binder, MD;  Location: [facility];  Service: Endoscopy;  Laterality: N/A;  started at 1303,  dilated 12.8-16mm    Prior to Admission medications   Medication Sig Start Date End Date Taking? Authorizing Provider  albuterol (VENTOLIN HFA) 108 (90 Base) MCG/ACT inhaler Inhale 2 puffs into the lungs every 6 (six) hours as needed for wheezing or shortness of breath. 08/06/22  Yes  Emokpae, Courage, MD  folic acid (FOLVITE) 664 MCG tablet Take 400 mcg by mouth daily.   Yes [provider]  HYDROcodone-acetaminophen (NORCO/VICODIN) 5-325 MG tablet One tablet every six hours for pain.  Limit 7 days. 08/31/22  Yes Sanjuana Kava, MD  lactulose (CHRONULAC) 10 GM/15ML solution Take 15 mLs (10 g total) by mouth daily. 08/30/22  Yes Sherron Monday, NP  LINZESS 290 MCG CAPS capsule TAKE (1) CAPSULE BY MOUTH EVERY DAY. 10/26/21  Yes Erenest Rasher, PA-C  Multiple Vitamin (MULTIVITAMIN WITH MINERALS) TABS tablet Take 1 tablet by mouth daily. 08/06/22  Yes Emokpae, Courage, MD  pantoprazole (PROTONIX) 40 MG tablet Take 1 tablet (40 mg total) by mouth daily. 08/06/22  Yes Emokpae, Courage, MD  PHENobarbital (LUMINAL) 32.4 MG tablet Take 1 tablet (32.4 mg total) by mouth 2 (two) times daily. 08/06/22  Yes Emokpae, Courage, MD  phenytoin (DILANTIN) 200 MG ER capsule Take 1 capsule (200 mg total) by mouth 2 (two) times daily. 08/06/22  Yes Emokpae, Courage, MD  potassium chloride SA (KLOR-CON M) 20 MEQ tablet Take 1 tablet (20 mEq total) by mouth daily. 08/06/22  Yes Emokpae, Courage, MD  sucralfate (CARAFATE) 1 GM/10ML suspension Take 10 mLs (1 g total) by mouth 4 (four) times daily -  with meals and at bedtime. 08/06/22  Yes Emokpae, Courage, MD  thiamine (VITAMIN B-1) 100 MG tablet Take 1 tablet (100 mg total) by mouth daily. 08/07/22  Yes Roxan Hockey, MD  acetaminophen (TYLENOL) 500 MG tablet Take 500 mg by mouth every 6 (six) hours as needed. As needed for pain    [provider]    Allergies as of 08/30/2022   (No Known Allergies)    Family History  Problem Relation Age of Onset   Liver disease Sister        etoh   Colon cancer Neg Hx    Colon polyps Neg Hx     Social History   Socioeconomic History   Marital status: Widowed    Spouse name: Not on file   Number of children: 3   Years of education: Not on file   Highest education level: Not on  file  Occupational History   Occupation: disability    Employer: UNEMPLOYED  Tobacco Use   Smoking status: Former    Packs/day: 0.04    Years: 9.00    Total pack years: 0.36    Types: Cigarettes    Quit date: 07/28/2014    Years since quitting: 8.1   Smokeless tobacco: Never  Vaping Use   Vaping Use: Never used  Substance and Sexual Activity   Alcohol use: Yes    Alcohol/week: 35.0 standard drinks of alcohol    Types: 35 Cans of beer per week    Comment: Patient reports she drinks 6-8 beers each day   Drug use: No   Sexual activity: Yes    Birth control/protection: Surgical  Other Topics Concern   Not on file  Social History Narrative   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 HAS 3 KIDS: ONE IN [place], ONE IN [place], AND ONE OOT.   Social Determinants of Health   Financial Resource Strain: Not on file  Food Insecurity: Not on file  Transportation Needs: Not on file  Physical Activity: Not on file  Stress: Not on file  Social Connections: Not on file  Intimate Partner Violence: Not on file    Review of Systems: General: Negative for fever, chills, fatigue, weakness. Eyes: Negative for vision changes.  ENT: Negative for hoarseness, difficulty swallowing , nasal congestion. CV: Negative for chest pain, angina, palpitations, dyspnea on exertion, peripheral edema.  Respiratory: Negative for dyspnea at rest, dyspnea on exertion, cough, sputum, wheezing.  GI: See history of present illness. GU:  Negative for dysuria, hematuria, urinary incontinence, urinary frequency, nocturnal urination.  MS: Negative for joint pain, low back pain.  Derm: Negative for rash or itching.  Neuro: Negative for weakness, abnormal sensation, seizure, frequent headaches, memory loss, confusion.  Psych: Negative for anxiety, depression Endo: Negative for unusual weight change.  Heme: Negative for bruising or bleeding. Allergy: Negative for rash or hives.  Physical Exam: Vital signs in last 24 hours:      General:   Alert,  Well-developed, well-nourished, pleasant and cooperative in NAD Head:  Normocephalic and atraumatic. Eyes:  Sclera clear, no icterus.   Conjunctiva pink. Ears:  Normal auditory acuity. Nose:  No deformity, discharge,  or lesions. Msk:  Symmetrical without gross deformities. Normal posture. Extremities:  Without clubbing or edema. Neurologic:  Alert and  oriented x4;  grossly normal neurologically. Skin:  Intact without significant lesions or rashes. Psych:  Alert and cooperative. Normal mood and affect.   Impression/Plan: [name] is here for an EGD to be performed for cirrhosis/variceal screening, abdominal pain, and colonoscopy for colon cancer screening purposes.  Risks, benefits, limitations, imponderables and alternatives regarding EGD have been reviewed with the patient. Questions have been answered. All parties  agreeable.

## 2022-10-07 LAB — SURGICAL PATHOLOGY

## 2022-10-08 ENCOUNTER — Other Ambulatory Visit: Payer: Self-pay

## 2022-10-08 ENCOUNTER — Emergency Department (HOSPITAL_COMMUNITY)
Admission: EM | Admit: 2022-10-08 | Discharge: 2022-10-09 | Disposition: A | Discharge status: Home / Self Care | Payer: 59 | Attending: Emergency Medicine | Admitting: Emergency Medicine

## 2022-10-08 ENCOUNTER — Encounter (HOSPITAL_COMMUNITY): Payer: Self-pay

## 2022-10-08 DIAGNOSIS — Y908 Blood alcohol level of 240 mg/100 ml or more: Secondary | ICD-10-CM | POA: Insufficient documentation

## 2022-10-08 DIAGNOSIS — R109 Unspecified abdominal pain: Secondary | ICD-10-CM | POA: Diagnosis present

## 2022-10-08 DIAGNOSIS — E876 Hypokalemia: Secondary | ICD-10-CM | POA: Insufficient documentation

## 2022-10-08 DIAGNOSIS — E871 Hypo-osmolality and hyponatremia: Secondary | ICD-10-CM | POA: Insufficient documentation

## 2022-10-08 DIAGNOSIS — F10920 Alcohol use, unspecified with intoxication, uncomplicated: Secondary | ICD-10-CM | POA: Diagnosis not present

## 2022-10-08 DIAGNOSIS — Z79899 Other long term (current) drug therapy: Secondary | ICD-10-CM | POA: Diagnosis not present

## 2022-10-08 LAB — COMPREHENSIVE METABOLIC PANEL
ALT: 23 U/L (ref 0–44)
AST: 67 U/L — ABNORMAL HIGH (ref 15–41)
Albumin: 3.3 g/dL — ABNORMAL LOW (ref 3.5–5.0)
Alkaline Phosphatase: 123 U/L (ref 38–126)
Anion gap: 14 (ref 5–15)
BUN: 5 mg/dL — ABNORMAL LOW (ref 8–23)
CO2: 19 mmol/L — ABNORMAL LOW (ref 22–32)
Calcium: 8.1 mg/dL — ABNORMAL LOW (ref 8.9–10.3)
Chloride: 97 mmol/L — ABNORMAL LOW (ref 98–111)
Creatinine, Ser: 0.5 mg/dL (ref 0.44–1.00)
GFR, Estimated: >60 mL/min (ref >60)
Glucose, Bld: 102 mg/dL — ABNORMAL HIGH (ref 70–99)
Potassium: 3 mmol/L — ABNORMAL LOW (ref 3.5–5.1)
Sodium: 130 mmol/L — ABNORMAL LOW (ref 135–145)
Total Bilirubin: 0.4 mg/dL (ref 0.3–1.2)
Total Protein: 7.5 g/dL (ref 6.5–8.1)

## 2022-10-08 LAB — URINALYSIS, ROUTINE W REFLEX MICROSCOPIC
Bilirubin Urine: NEGATIVE
Glucose, UA: NEGATIVE mg/dL
Hgb urine dipstick: NEGATIVE
Ketones, ur: NEGATIVE mg/dL
Leukocytes,Ua: NEGATIVE
Nitrite: NEGATIVE
Protein, ur: NEGATIVE mg/dL
Specific Gravity, Urine: 1.002 — ABNORMAL LOW (ref 1.005–1.030)
pH: 6 (ref 5.0–8.0)

## 2022-10-08 LAB — CBC
HCT: 33.8 % — ABNORMAL LOW (ref 36.0–46.0)
Hemoglobin: 11.2 g/dL — ABNORMAL LOW (ref 12.0–15.0)
MCH: 28 pg (ref 26.0–34.0)
MCHC: 33.1 g/dL (ref 30.0–36.0)
MCV: 84.5 fL (ref 80.0–100.0)
Platelets: ADEQUATE 10*3/uL (ref 150–400)
RBC: 4 MIL/uL (ref 3.87–5.11)
RDW: 15.5 % (ref 11.5–15.5)
WBC: 3.5 10*3/uL — ABNORMAL LOW (ref 4.0–10.5)
nRBC: 0 % (ref 0.0–0.2)

## 2022-10-08 LAB — ETHANOL: Alcohol, Ethyl (B): 343 mg/dL (ref <10)

## 2022-10-08 LAB — LIPASE, BLOOD: Lipase: 47 U/L (ref 11–51)

## 2022-10-08 MED ORDER — ONDANSETRON HCL 4 MG/2ML IJ SOLN
4.0000 mg | Freq: Once | INTRAMUSCULAR | Status: DC
  Filled 2022-10-08: qty 2

## 2022-10-08 MED ORDER — THIAMINE HCL 100 MG/ML IJ SOLN
100.0000 mg | Freq: Once | INTRAMUSCULAR | Status: DC
  Filled 2022-10-08: qty 2

## 2022-10-08 MED ORDER — SODIUM CHLORIDE 0.9 % IV SOLN: 1000.0000 mL | INTRAVENOUS | Status: DC

## 2022-10-08 MED ORDER — PANTOPRAZOLE SODIUM 40 MG PO TBEC
40.0000 mg | DELAYED_RELEASE_TABLET | Freq: Once | ORAL | Status: AC
  Administered 2022-10-09: 40 mg via ORAL
  Filled 2022-10-08: qty 1

## 2022-10-08 MED ORDER — THIAMINE MONONITRATE 100 MG PO TABS
100.0000 mg | ORAL_TABLET | Freq: Once | ORAL | Status: AC
  Administered 2022-10-09: 100 mg via ORAL
  Filled 2022-10-08: qty 1

## 2022-10-08 MED ORDER — PANTOPRAZOLE SODIUM 40 MG IV SOLR
40.0000 mg | Freq: Once | INTRAVENOUS | Status: DC
  Filled 2022-10-08: qty 10

## 2022-10-08 MED ORDER — SODIUM CHLORIDE 0.9 % IV BOLUS (SEPSIS): 1000.0000 mL | Freq: Once | INTRAVENOUS | Status: DC

## 2022-10-08 MED ORDER — POTASSIUM CHLORIDE CRYS ER 20 MEQ PO TBCR
40.0000 meq | EXTENDED_RELEASE_TABLET | Freq: Once | ORAL | Status: AC
  Administered 2022-10-08: 40 meq via ORAL
  Filled 2022-10-08: qty 2

## 2022-10-08 NOTE — Discharge Instructions (Signed)
Please consider trying to quit drinking.  Review the resource guide for outpatient treatment centers.  Follow up with your doctor to have your electrolytes rechecked next week.

## 2022-10-08 NOTE — ED Notes (Signed)
Pt taken to bathroom,  pt assisted from [facility] to toilet, pt brief changed, paper scrub pants put on pt as she came in with no pants, socks, or shoes, just wearing sweater and pull up.

## 2022-10-08 NOTE — ED Provider Triage Note (Signed)
Emergency Medicine Provider Triage Evaluation Note  [name] , a 66 y.o. female  was evaluated in triage.  Pt complains of abdominal pain.  Patient has a history of alcoholic cirrhosis of the liver.  She states she was drinking beer this evening and started to have an upset stomach and abdominal pain.  She has had some diarrhea but denies any vomiting.  Review of Systems  Positive: Abdominal pain Negative: Vomiting  Physical Exam  BP 103/63   Pulse 88   Temp 97.6 F (36.4 C) (Oral)   Resp 18   Ht 1.676 m ('5\' 6"'$ )   Wt 63.5 kg   SpO2 100%   BMI 22.60 kg/m  Gen:   Awake, no distress   Resp:  Normal effort  MSK:   Moves extremities without difficulty  Other:  Abdomen soft, diffusely tender  Medical Decision Making  Medically screening exam initiated at 8:52 PM.  Appropriate orders placed.  [name] was informed that the remainder of the evaluation will be completed by another provider, this initial triage assessment does not replace that evaluation, and the importance of remaining in the ED until their evaluation is complete.     Dorie Rank, MD 10/08/22 2055

## 2022-10-08 NOTE — ED Provider Notes (Signed)
[facility] EMERGENCY DEPARTMENT Provider Note   CSN: [id number] Arrival date & time: 10/08/22  1943     History  Chief Complaint  Patient presents with  . Abdominal Pain    After drinking beer    [name] is a 66 y.o. female.   Abdominal Pain    Patient has a history of alcoholic cirrhosis, recurrent abdominal pain.  Patient presented to the ED for evaluation abdominal pain that started this evening.  Patient states it started after drinking 2 beers.  She is having generalized abdominal discomfort.  No nausea vomiting but she has had some diarrhea.  No fevers.  Home Medications Prior to Admission medications   Medication Sig Start Date End Date Taking? Authorizing Provider  acetaminophen (TYLENOL) 500 MG tablet Take 500 mg by mouth every 6 (six) hours as needed. As needed for pain    [provider]  albuterol (VENTOLIN HFA) 108 (90 Base) MCG/ACT inhaler Inhale 2 puffs into the lungs every 6 (six) hours as needed for wheezing or shortness of breath. 08/06/22   Roxan Hockey, MD  folic acid (FOLVITE) 528 MCG tablet Take 400 mcg by mouth daily.    [provider]  HYDROcodone-acetaminophen (NORCO/VICODIN) 5-325 MG tablet One tablet every six hours for pain.  Limit 7 days. 08/31/22   Sanjuana Kava, MD  lactulose (CHRONULAC) 10 GM/15ML solution Take 15 mLs (10 g total) by mouth daily. 08/30/22   Sherron Monday, NP  LINZESS 290 MCG CAPS capsule TAKE (1) CAPSULE BY MOUTH EVERY DAY. 10/26/21   Erenest Rasher, PA-C  Multiple Vitamin (MULTIVITAMIN WITH MINERALS) TABS tablet Take 1 tablet by mouth daily. 08/06/22   Roxan Hockey, MD  pantoprazole (PROTONIX) 40 MG tablet Take 1 tablet (40 mg total) by mouth 2 (two) times daily. 10/06/22 10/06/23  Eloise Harman, DO  PHENobarbital (LUMINAL) 32.4 MG tablet Take 1 tablet (32.4 mg total) by mouth 2 (two) times daily. 08/06/22   Roxan Hockey, MD  phenytoin (DILANTIN) 200 MG ER capsule Take 1 capsule  (200 mg total) by mouth 2 (two) times daily. 08/06/22   Roxan Hockey, MD  potassium chloride SA (KLOR-CON M) 20 MEQ tablet Take 1 tablet (20 mEq total) by mouth daily. 08/06/22   Roxan Hockey, MD  sucralfate (CARAFATE) 1 GM/10ML suspension Take 10 mLs (1 g total) by mouth 4 (four) times daily -  with meals and at bedtime. 08/06/22   Roxan Hockey, MD  thiamine (VITAMIN B-1) 100 MG tablet Take 1 tablet (100 mg total) by mouth daily. 08/07/22   Roxan Hockey, MD      Allergies    Patient has no known allergies.    Review of Systems   Review of Systems  Gastrointestinal:  Positive for abdominal pain.    Physical Exam Updated Vital Signs BP (!) 145/80   Pulse 90   Temp 97.6 F (36.4 C) (Oral)   Resp 18   Ht 1.676 m ('5\' 6"'$ )   Wt 63.5 kg   SpO2 96%   BMI 22.60 kg/m  Physical Exam Vitals and nursing note reviewed.  Constitutional:      Appearance: She is well-developed. She is not diaphoretic.  HENT:     Head: Normocephalic and atraumatic.     Right Ear: External ear normal.     Left Ear: External ear normal.  Eyes:     General: No scleral icterus.       Right eye: No discharge.  Left eye: No discharge.     Conjunctiva/sclera: Conjunctivae normal.  Neck:     Trachea: No tracheal deviation.  Cardiovascular:     Rate and Rhythm: Normal rate and regular rhythm.  Pulmonary:     Effort: Pulmonary effort is normal. No respiratory distress.     Breath sounds: Normal breath sounds. No stridor. No wheezing or rales.  Abdominal:     General: Bowel sounds are normal. There is no distension.     Palpations: Abdomen is soft.     Tenderness: There is generalized abdominal tenderness. There is no guarding or rebound.  Musculoskeletal:        General: No tenderness or deformity.     Cervical back: Neck supple.  Skin:    General: Skin is warm and dry.     Findings: No rash.  Neurological:     General: No focal deficit present.     Mental Status: She is alert.      Cranial Nerves: No cranial nerve deficit, dysarthria or facial asymmetry.     Sensory: No sensory deficit.     Motor: No abnormal muscle tone or seizure activity.     Coordination: Coordination normal.  Psychiatric:        Mood and Affect: Mood normal.     ED Results / Procedures / Treatments   Labs (all labs ordered are listed, but only abnormal results are displayed) Labs Reviewed  COMPREHENSIVE METABOLIC PANEL - Abnormal; Notable for the following components:      Result Value   Sodium 130 (*)    Potassium 3.0 (*)    Chloride 97 (*)    CO2 19 (*)    Glucose, Bld 102 (*)    BUN 5 (*)    Calcium 8.1 (*)    Albumin 3.3 (*)    AST 67 (*)    All other components within normal limits  CBC - Abnormal; Notable for the following components:   WBC 3.5 (*)    Hemoglobin 11.2 (*)    HCT 33.8 (*)    All other components within normal limits  URINALYSIS, ROUTINE W REFLEX MICROSCOPIC - Abnormal; Notable for the following components:   Color, Urine STRAW (*)    Specific Gravity, Urine 1.002 (*)    All other components within normal limits  ETHANOL - Abnormal; Notable for the following components:   Alcohol, Ethyl (B) 343 (*)    All other components within normal limits  LIPASE, BLOOD    EKG None  Radiology No results found.  Procedures Procedures    Medications Ordered in ED Medications  potassium chloride SA (KLOR-CON M) CR tablet 40 mEq (has no administration in time range)  sodium chloride 0.9 % bolus 1,000 mL (has no administration in time range)    Followed by  0.9 %  sodium chloride infusion (has no administration in time range)  ondansetron (ZOFRAN) injection 4 mg (has no administration in time range)  pantoprazole (PROTONIX) injection 40 mg (has no administration in time range)  thiamine (VITAMIN B1) injection 100 mg (has no administration in time range)    ED Course/ Medical Decision Making/ A&P Clinical Course as of 10/08/22 2338  Fri Oct 08, 2022  2259  Comprehensive metabolic panel(!) Hyponatremia and hypokalemia noted. [JK]  2300 CBC(!) Hemoglobin stable compared to previous values, platelets clumped but overall appear to be adequate [JK]  2300 Ethanol(!!) Ethanol level significantly elevated at 343 [JK]  2338 Patient has not had any vomiting.  There  is been some difficulty getting IV access.  I will order p.o. meds patient also is asking for something to eat and drink [JK]    Clinical Course User Index [JK] Dorie Rank, MD                           Medical Decision Making Problems Addressed: Alcoholic intoxication without complication [facility]): acute illness or injury that poses a threat to life or bodily functions  Amount and/or Complexity of Data Reviewed Labs: ordered. Decision-making details documented in ED Course.  Risk Prescription drug management.   Patient presented to the ED for evaluation abdominal pain.  Patient admits to recent alcohol consumption today.  ED workup does not show any signs of pancreatitis.  No findings to suggest acute hepatitis.  Patient noted to be hyponatremic and hypokalemic.  Think this is related to her acute alcohol consumption.  Patient was treated with IV fluids, potassium thiamine.  She was monitored in the ED.  No recurrent vomiting.  Will plan on monitoring in the ED till she is clinically sober and safe for discharge.        Final Clinical Impression(s) / ED Diagnoses Final diagnoses:  Alcoholic intoxication without complication [facility])    Rx / DC Orders ED Discharge Orders     None         Dorie Rank, MD 10/08/22 [phone]

## 2022-10-08 NOTE — ED Notes (Signed)
Date and time results received: 10/08/22 2057   Test:  ETOH Critical Value: 343  Name of Provider Notified: Tomi Bamberger. J MD

## 2022-10-08 NOTE — ED Triage Notes (Addendum)
Pt brought here by EMS from home with c/o abd pain that started after drinking beer. Pt alert and oriented to self, place, and situation, not to time, pt is at her baseline.

## 2022-10-08 NOTE — Anesthesia Postprocedure Evaluation (Signed)
Anesthesia Post Note  Patient: [name]  Procedure(s) Performed: COLONOSCOPY WITH PROPOFOL ESOPHAGOGASTRODUODENOSCOPY (EGD) WITH PROPOFOL BIOPSY POLYPECTOMY  Patient location during evaluation: Phase II Anesthesia Type: General Level of consciousness: awake Pain management: pain level controlled Vital Signs Assessment: post-procedure vital signs reviewed and stable Respiratory status: spontaneous breathing and respiratory function stable Cardiovascular status: blood pressure returned to baseline and stable Postop Assessment: no headache and no apparent nausea or vomiting Anesthetic complications: no Comments: Late entry   No notable events documented.   Last Vitals:  Vitals:   10/06/22 1136 10/06/22 1240  BP: (!) 153/78 108/64  Pulse: 89 87  Resp: (!) 22 20  Temp: 36.5 C 36.6 C  SpO2: 100% 100%    Last Pain:  Vitals:   10/06/22 1240  TempSrc: Oral  PainSc: 0-No pain                 Louann Sjogren

## 2022-10-09 DIAGNOSIS — F10920 Alcohol use, unspecified with intoxication, uncomplicated: Secondary | ICD-10-CM | POA: Diagnosis not present

## 2022-10-09 MED ORDER — PANTOPRAZOLE SODIUM 40 MG PO TBEC: 40.0000 mg | DELAYED_RELEASE_TABLET | Freq: Every day | ORAL | 0 refills | Status: DC

## 2022-10-09 MED ORDER — POTASSIUM CHLORIDE CRYS ER 20 MEQ PO TBCR: 40.0000 meq | EXTENDED_RELEASE_TABLET | Freq: Two times a day (BID) | ORAL | 0 refills | Status: DC

## 2022-10-09 NOTE — ED Notes (Signed)
Po challenge completed with out difficulty

## 2022-10-09 NOTE — ED Provider Notes (Signed)
12:03 AM Assumed care from Dr. Tomi Bamberger, please see their note for full history, physical and decision making until this point. In brief this is a 66 y.o. year old female who presented to the ED tonight with Abdominal Pain (After drinking beer)     Intoxicated. Some metabolic abnormalities, getting meds for. Needs to tolerate PO and have a safe discharge plan.   Reevaluation patient is oriented, alert, clear speech, tolerating PO. Felt to be stable for discharge to care of sitter.   Discharge instructions, including strict return precautions for new or worsening symptoms, given. Patient and/or family verbalized understanding and agreement with the plan as described.   Labs, studies and imaging reviewed by myself and considered in medical decision making if ordered. Imaging interpreted by radiology.  Labs Reviewed  COMPREHENSIVE METABOLIC PANEL - Abnormal; Notable for the following components:      Result Value   Sodium 130 (*)    Potassium 3.0 (*)    Chloride 97 (*)    CO2 19 (*)    Glucose, Bld 102 (*)    BUN 5 (*)    Calcium 8.1 (*)    Albumin 3.3 (*)    AST 67 (*)    All other components within normal limits  CBC - Abnormal; Notable for the following components:   WBC 3.5 (*)    Hemoglobin 11.2 (*)    HCT 33.8 (*)    All other components within normal limits  URINALYSIS, ROUTINE W REFLEX MICROSCOPIC - Abnormal; Notable for the following components:   Color, Urine STRAW (*)    Specific Gravity, Urine 1.002 (*)    All other components within normal limits  ETHANOL - Abnormal; Notable for the following components:   Alcohol, Ethyl (B) 343 (*)    All other components within normal limits  LIPASE, BLOOD    No orders to display    No follow-ups on file.    Savanna Dooley, Corene Cornea, MD 10/10/22 [phone]

## 2022-10-13 ENCOUNTER — Telehealth: Payer: Self-pay | Admitting: *Deleted

## 2022-10-13 ENCOUNTER — Other Ambulatory Visit: Payer: Self-pay | Admitting: *Deleted

## 2022-10-13 DIAGNOSIS — K7030 Alcoholic cirrhosis of liver without ascites: Secondary | ICD-10-CM

## 2022-10-13 DIAGNOSIS — R7989 Other specified abnormal findings of blood chemistry: Secondary | ICD-10-CM

## 2022-10-13 DIAGNOSIS — K2100 Gastro-esophageal reflux disease with esophagitis, without bleeding: Secondary | ICD-10-CM

## 2022-10-13 NOTE — Telephone Encounter (Signed)
Mailed lab requisitions informed to have labs completed the week of Jan 10

## 2022-10-19 ENCOUNTER — Encounter (HOSPITAL_COMMUNITY): Payer: Self-pay | Admitting: Internal Medicine

## 2022-10-27 ENCOUNTER — Encounter: Payer: Self-pay | Admitting: Orthopaedic Surgery

## 2022-10-27 ENCOUNTER — Ambulatory Visit (INDEPENDENT_AMBULATORY_CARE_PROVIDER_SITE_OTHER): Payer: 59 | Admitting: Orthopaedic Surgery

## 2022-10-27 VITALS — BP 145/82 | HR 112

## 2022-10-27 DIAGNOSIS — M25561 Pain in right knee: Secondary | ICD-10-CM | POA: Diagnosis not present

## 2022-10-27 DIAGNOSIS — G8929 Other chronic pain: Secondary | ICD-10-CM

## 2022-10-27 DIAGNOSIS — R531 Weakness: Secondary | ICD-10-CM

## 2022-10-27 MED ORDER — METHYLPREDNISOLONE ACETATE 40 MG/ML IJ SUSP
40.0000 mg | Freq: Once | INTRAMUSCULAR | Status: AC
  Administered 2022-10-27: 40 mg via INTRA_ARTICULAR

## 2022-10-27 MED ORDER — HYDROCODONE-ACETAMINOPHEN 5-325 MG PO TABS: ORAL_TABLET | ORAL | 0 refills | Status: DC

## 2022-10-27 NOTE — Addendum Note (Signed)
Addended by: Obie Dredge A on: 10/27/2022 10:54 AM   Modules accepted: Orders

## 2022-10-27 NOTE — Progress Notes (Signed)
PROCEDURE NOTE:  The patient requests injections of the right knee , verbal consent was obtained.  The right knee was prepped appropriately after time out was performed.   Sterile technique was observed and injection of 1 cc of DepoMedrol '40mg'$  with several cc's of plain xylocaine. Anesthesia was provided by ethyl chloride and a 20-gauge needle was used to inject the knee area. The injection was tolerated well.  A band aid dressing was applied.  The patient was advised to apply ice later today and tomorrow to the injection sight as needed.  Encounter Diagnoses  Name Primary?   Chronic pain of right knee Yes   Right sided weakness    Return in two months.  I have reviewed the [place] web site prior to prescribing narcotic medicine for this patient.  Call if any problem.  Precautions discussed.  Electronically Signed Sanjuana Kava, MD 1/10/20249:42 AM

## 2022-10-28 ENCOUNTER — Ambulatory Visit: Payer: 59 | Admitting: Orthopaedic Surgery

## 2022-11-02 ENCOUNTER — Encounter: Payer: Self-pay | Admitting: Neurology

## 2022-12-08 ENCOUNTER — Ambulatory Visit (HOSPITAL_COMMUNITY)
Admission: RE | Admit: 2022-12-08 | Discharge: 2022-12-08 | Disposition: A | Discharge status: Home / Self Care | Payer: 59 | Source: Ambulatory Visit | Attending: Internal Medicine | Admitting: Internal Medicine

## 2022-12-08 DIAGNOSIS — Z1231 Encounter for screening mammogram for malignant neoplasm of breast: Secondary | ICD-10-CM | POA: Diagnosis not present

## 2022-12-28 ENCOUNTER — Ambulatory Visit: Payer: 59 | Admitting: Orthopaedic Surgery

## 2022-12-29 ENCOUNTER — Ambulatory Visit (INDEPENDENT_AMBULATORY_CARE_PROVIDER_SITE_OTHER): Payer: 59 | Admitting: Orthopaedic Surgery

## 2022-12-29 ENCOUNTER — Encounter: Payer: Self-pay | Admitting: Orthopaedic Surgery

## 2022-12-29 VITALS — Ht 63.0 in | Wt 140.0 lb

## 2022-12-29 DIAGNOSIS — G8929 Other chronic pain: Secondary | ICD-10-CM | POA: Diagnosis not present

## 2022-12-29 DIAGNOSIS — M25561 Pain in right knee: Secondary | ICD-10-CM

## 2022-12-29 MED ORDER — METHYLPREDNISOLONE ACETATE 40 MG/ML IJ SUSP
40.0000 mg | Freq: Once | INTRAMUSCULAR | Status: AC
  Administered 2022-12-29: 40 mg via INTRA_ARTICULAR

## 2022-12-29 MED ORDER — HYDROCODONE-ACETAMINOPHEN 5-325 MG PO TABS: ORAL_TABLET | ORAL | 0 refills | Status: DC

## 2022-12-29 NOTE — Progress Notes (Signed)
PROCEDURE NOTE:  The patient requests injections of the right knee , verbal consent was obtained.  The right knee was prepped appropriately after time out was performed.   Sterile technique was observed and injection of 1 cc of DepoMedrol '40mg'$  with several cc's of plain xylocaine. Anesthesia was provided by ethyl chloride and a 20-gauge needle was used to inject the knee area. The injection was tolerated well.  A band aid dressing was applied.  The patient was advised to apply ice later today and tomorrow to the injection sight as needed.  Encounter Diagnosis  Name Primary?   Chronic pain of right knee Yes   Return in two months.  I have reviewed the [place] web site prior to prescribing narcotic medicine for this patient.  Call if any problem.  Precautions discussed.  Electronically Signed Sanjuana Kava, MD 3/13/202410:01 AM

## 2023-01-05 ENCOUNTER — Inpatient Hospital Stay (HOSPITAL_COMMUNITY)
Admission: EM | Admit: 2023-01-05 | Discharge: 2023-01-08 | DRG: 683 | Disposition: A | Discharge status: Home Health | Payer: 59 | Attending: Internal Medicine | Admitting: Internal Medicine

## 2023-01-05 ENCOUNTER — Emergency Department (HOSPITAL_COMMUNITY): Payer: 59

## 2023-01-05 ENCOUNTER — Other Ambulatory Visit: Payer: Self-pay

## 2023-01-05 ENCOUNTER — Encounter (HOSPITAL_COMMUNITY): Payer: Self-pay | Admitting: Radiology

## 2023-01-05 DIAGNOSIS — N3000 Acute cystitis without hematuria: Secondary | ICD-10-CM

## 2023-01-05 DIAGNOSIS — N179 Acute kidney failure, unspecified: Principal | ICD-10-CM | POA: Diagnosis present

## 2023-01-05 DIAGNOSIS — N390 Urinary tract infection, site not specified: Secondary | ICD-10-CM | POA: Insufficient documentation

## 2023-01-05 DIAGNOSIS — Z87898 Personal history of other specified conditions: Secondary | ICD-10-CM

## 2023-01-05 DIAGNOSIS — E46 Unspecified protein-calorie malnutrition: Secondary | ICD-10-CM | POA: Insufficient documentation

## 2023-01-05 DIAGNOSIS — K7460 Unspecified cirrhosis of liver: Secondary | ICD-10-CM | POA: Diagnosis present

## 2023-01-05 DIAGNOSIS — J45909 Unspecified asthma, uncomplicated: Secondary | ICD-10-CM | POA: Diagnosis present

## 2023-01-05 DIAGNOSIS — F1010 Alcohol abuse, uncomplicated: Secondary | ICD-10-CM | POA: Diagnosis present

## 2023-01-05 DIAGNOSIS — E876 Hypokalemia: Secondary | ICD-10-CM | POA: Diagnosis present

## 2023-01-05 DIAGNOSIS — I959 Hypotension, unspecified: Secondary | ICD-10-CM | POA: Diagnosis present

## 2023-01-05 DIAGNOSIS — K921 Melena: Secondary | ICD-10-CM | POA: Diagnosis present

## 2023-01-05 DIAGNOSIS — E872 Acidosis, unspecified: Secondary | ICD-10-CM | POA: Diagnosis not present

## 2023-01-05 DIAGNOSIS — Z79899 Other long term (current) drug therapy: Secondary | ICD-10-CM

## 2023-01-05 DIAGNOSIS — Z6824 Body mass index (BMI) 24.0-24.9, adult: Secondary | ICD-10-CM

## 2023-01-05 DIAGNOSIS — F1020 Alcohol dependence, uncomplicated: Secondary | ICD-10-CM | POA: Diagnosis present

## 2023-01-05 DIAGNOSIS — N3090 Cystitis, unspecified without hematuria: Principal | ICD-10-CM

## 2023-01-05 DIAGNOSIS — Z87891 Personal history of nicotine dependence: Secondary | ICD-10-CM

## 2023-01-05 DIAGNOSIS — E8809 Other disorders of plasma-protein metabolism, not elsewhere classified: Secondary | ICD-10-CM

## 2023-01-05 DIAGNOSIS — Z90710 Acquired absence of both cervix and uterus: Secondary | ICD-10-CM

## 2023-01-05 DIAGNOSIS — R1084 Generalized abdominal pain: Secondary | ICD-10-CM

## 2023-01-05 DIAGNOSIS — Z8711 Personal history of peptic ulcer disease: Secondary | ICD-10-CM

## 2023-01-05 DIAGNOSIS — E441 Mild protein-calorie malnutrition: Secondary | ICD-10-CM | POA: Diagnosis present

## 2023-01-05 DIAGNOSIS — K219 Gastro-esophageal reflux disease without esophagitis: Secondary | ICD-10-CM | POA: Diagnosis present

## 2023-01-05 DIAGNOSIS — Z8673 Personal history of transient ischemic attack (TIA), and cerebral infarction without residual deficits: Secondary | ICD-10-CM

## 2023-01-05 DIAGNOSIS — G4730 Sleep apnea, unspecified: Secondary | ICD-10-CM | POA: Diagnosis present

## 2023-01-05 DIAGNOSIS — E871 Hypo-osmolality and hyponatremia: Secondary | ICD-10-CM | POA: Diagnosis present

## 2023-01-05 DIAGNOSIS — E860 Dehydration: Secondary | ICD-10-CM | POA: Diagnosis not present

## 2023-01-05 DIAGNOSIS — I10 Essential (primary) hypertension: Secondary | ICD-10-CM | POA: Diagnosis present

## 2023-01-05 DIAGNOSIS — D649 Anemia, unspecified: Secondary | ICD-10-CM | POA: Diagnosis present

## 2023-01-05 DIAGNOSIS — Z8719 Personal history of other diseases of the digestive system: Secondary | ICD-10-CM

## 2023-01-05 LAB — CBC WITH DIFFERENTIAL/PLATELET
Abs Immature Granulocytes: 0.01 10*3/uL (ref 0.00–0.07)
Basophils Absolute: 0 10*3/uL (ref 0.0–0.1)
Basophils Relative: 1 %
Eosinophils Absolute: 0 10*3/uL (ref 0.0–0.5)
Eosinophils Relative: 0 %
HCT: 33 % — ABNORMAL LOW (ref 36.0–46.0)
Hemoglobin: 11 g/dL — ABNORMAL LOW (ref 12.0–15.0)
Immature Granulocytes: 0 %
Lymphocytes Relative: 18 %
Lymphs Abs: 0.7 10*3/uL (ref 0.7–4.0)
MCH: 27.1 pg (ref 26.0–34.0)
MCHC: 33.3 g/dL (ref 30.0–36.0)
MCV: 81.3 fL (ref 80.0–100.0)
Monocytes Absolute: 0.4 10*3/uL (ref 0.1–1.0)
Monocytes Relative: 10 %
Neutro Abs: 3 10*3/uL (ref 1.7–7.7)
Neutrophils Relative %: 71 %
Platelets: 154 10*3/uL (ref 150–400)
RBC: 4.06 MIL/uL (ref 3.87–5.11)
RDW: 15.9 % — ABNORMAL HIGH (ref 11.5–15.5)
WBC: 4.2 10*3/uL (ref 4.0–10.5)
nRBC: 0 % (ref 0.0–0.2)

## 2023-01-05 LAB — URINALYSIS, ROUTINE W REFLEX MICROSCOPIC
Bilirubin Urine: NEGATIVE
Glucose, UA: NEGATIVE mg/dL
Ketones, ur: NEGATIVE mg/dL
Nitrite: NEGATIVE
Protein, ur: NEGATIVE mg/dL
Specific Gravity, Urine: 1.035 — ABNORMAL HIGH (ref 1.005–1.030)
WBC, UA: >50 WBC/hpf (ref 0–5)
pH: 6 (ref 5.0–8.0)

## 2023-01-05 LAB — TYPE AND SCREEN
ABO/RH(D): O POS
Antibody Screen: NEGATIVE

## 2023-01-05 LAB — COMPREHENSIVE METABOLIC PANEL
ALT: 25 U/L (ref 0–44)
AST: 77 U/L — ABNORMAL HIGH (ref 15–41)
Albumin: 3.3 g/dL — ABNORMAL LOW (ref 3.5–5.0)
Alkaline Phosphatase: 120 U/L (ref 38–126)
Anion gap: 14 (ref 5–15)
BUN: 6 mg/dL — ABNORMAL LOW (ref 8–23)
CO2: 24 mmol/L (ref 22–32)
Calcium: 8.3 mg/dL — ABNORMAL LOW (ref 8.9–10.3)
Chloride: 92 mmol/L — ABNORMAL LOW (ref 98–111)
Creatinine, Ser: 1.18 mg/dL — ABNORMAL HIGH (ref 0.44–1.00)
GFR, Estimated: 51 mL/min — ABNORMAL LOW (ref >60)
Glucose, Bld: 135 mg/dL — ABNORMAL HIGH (ref 70–99)
Potassium: 2.6 mmol/L — CL (ref 3.5–5.1)
Sodium: 130 mmol/L — ABNORMAL LOW (ref 135–145)
Total Bilirubin: 1.2 mg/dL (ref 0.3–1.2)
Total Protein: 7.3 g/dL (ref 6.5–8.1)

## 2023-01-05 LAB — I-STAT CHEM 8, ED
BUN: 4 mg/dL — ABNORMAL LOW (ref 8–23)
Calcium, Ion: 0.95 mmol/L — ABNORMAL LOW (ref 1.15–1.40)
Chloride: 92 mmol/L — ABNORMAL LOW (ref 98–111)
Creatinine, Ser: 1.1 mg/dL — ABNORMAL HIGH (ref 0.44–1.00)
Glucose, Bld: 132 mg/dL — ABNORMAL HIGH (ref 70–99)
HCT: 37 % (ref 36.0–46.0)
Hemoglobin: 12.6 g/dL (ref 12.0–15.0)
Potassium: 2.6 mmol/L — CL (ref 3.5–5.1)
Sodium: 135 mmol/L (ref 135–145)
TCO2: 25 mmol/L (ref 22–32)

## 2023-01-05 LAB — LACTIC ACID, PLASMA
Lactic Acid, Venous: 3.7 mmol/L (ref 0.5–1.9)
Lactic Acid, Venous: 3.7 mmol/L (ref 0.5–1.9)

## 2023-01-05 LAB — MAGNESIUM: Magnesium: 1.5 mg/dL — ABNORMAL LOW (ref 1.7–2.4)

## 2023-01-05 LAB — LIPASE, BLOOD: Lipase: 26 U/L (ref 11–51)

## 2023-01-05 LAB — PROTIME-INR
INR: 1.2 (ref 0.8–1.2)
Prothrombin Time: 14.9 seconds (ref 11.4–15.2)

## 2023-01-05 MED ORDER — PANTOPRAZOLE SODIUM 40 MG IV SOLR
40.0000 mg | Freq: Two times a day (BID) | INTRAVENOUS | Status: DC
  Administered 2023-01-05 – 2023-01-08 (×6): 40 mg via INTRAVENOUS
  Filled 2023-01-05 (×6): qty 10

## 2023-01-05 MED ORDER — SODIUM CHLORIDE 0.9 % IV BOLUS: 1000.0000 mL | Freq: Once | INTRAVENOUS | Status: DC

## 2023-01-05 MED ORDER — POTASSIUM CHLORIDE 10 MEQ/100ML IV SOLN: 10.0000 meq | INTRAVENOUS | Status: DC

## 2023-01-05 MED ORDER — FENTANYL CITRATE PF 50 MCG/ML IJ SOSY
50.0000 ug | PREFILLED_SYRINGE | Freq: Once | INTRAMUSCULAR | Status: AC
  Administered 2023-01-05: 50 ug via INTRAVENOUS
  Filled 2023-01-05: qty 1

## 2023-01-05 MED ORDER — MAGNESIUM SULFATE 2 GM/50ML IV SOLN
2.0000 g | Freq: Once | INTRAVENOUS | Status: AC
  Administered 2023-01-05: 2 g via INTRAVENOUS
  Filled 2023-01-05: qty 50

## 2023-01-05 MED ORDER — SODIUM CHLORIDE 0.9 % IV SOLN
1.0000 g | Freq: Once | INTRAVENOUS | Status: AC
  Administered 2023-01-05: 1 g via INTRAVENOUS
  Filled 2023-01-05: qty 10

## 2023-01-05 MED ORDER — IOHEXOL 350 MG/ML SOLN
100.0000 mL | Freq: Once | INTRAVENOUS | Status: AC | PRN
  Administered 2023-01-05: 80 mL via INTRAVENOUS

## 2023-01-05 MED ORDER — POTASSIUM CHLORIDE IN NACL 40-0.9 MEQ/L-% IV SOLN
INTRAVENOUS | Status: AC
  Filled 2023-01-05: qty 1000

## 2023-01-05 MED ORDER — POTASSIUM CHLORIDE 10 MEQ/100ML IV SOLN
10.0000 meq | INTRAVENOUS | Status: AC
  Filled 2023-01-05: qty 100

## 2023-01-05 MED ORDER — METOCLOPRAMIDE HCL 5 MG/ML IJ SOLN
10.0000 mg | Freq: Once | INTRAMUSCULAR | Status: AC
  Administered 2023-01-05: 10 mg via INTRAVENOUS
  Filled 2023-01-05: qty 2

## 2023-01-05 MED ORDER — LACTATED RINGERS IV BOLUS
1000.0000 mL | Freq: Once | INTRAVENOUS | Status: AC
  Administered 2023-01-05: 1000 mL via INTRAVENOUS

## 2023-01-05 MED ORDER — POTASSIUM CHLORIDE 10 MEQ/100ML IV SOLN
10.0000 meq | INTRAVENOUS | Status: AC
  Administered 2023-01-06 (×2): 10 meq via INTRAVENOUS
  Filled 2023-01-05: qty 100

## 2023-01-05 NOTE — H&P (Incomplete)
History and Physical    Patient: [name] [id number] DOB: 15-Jun-1956 DOA: 01/05/2023 DOS: the patient was seen and examined on 01/06/2023 PCP: Carrolyn Meiers, MD  Patient coming from: Home  Chief Complaint:  Chief Complaint  Patient presents with   Abdominal Pain   HPI: [name] is a 67 y.o. female with medical history significant of stroke at the age of 65 (required craniotomy), GERD, PUD, longstanding history of seizures who presents to the emergency department due to abdominal pain, nausea, vomiting which started last night.  Patient endorsed 3 episodes of dark red stools and blood in vomitus.  Abdominal pain is in lower quadrants, pain was intermittent and was rated as 8/10 on pain scale.  Last alcohol drink (beer) was about 24 hours ago.  She endorsed to about 6 beers daily and denies history of alcohol withdrawal.  ED Course:  In the emergency department, temperature was 97.7, respiratory rate 16/min, pulse 91 bpm, BP 101/48, O2 sats 100%.  Workup in the ED showed normocytic anemia, BMP showed sodium 130, potassium 2.6, chloride 92, bicarb 24, blood glucose 135, BUN 6, creatinine 1.18 AST 77, ALT 25, ALP 120, magnesium 1.5, lactic acid x 2 - 3.7 x 2. Urinalysis was unimpressive for UTI. FOBT was negative. CTA abdomen and pelvis without and with contrast showed no evidence for acute GI bleeding, no acute abnormality in the abdomen or pelvis. Patient was treated with IV ceftriaxone due to presumed UTI, fentanyl was given, Reglan was given, magnesium was replenished, IV hydration was provided, potassium was replenished.  Hospitalist was asked to admit patient for further evaluation and management.   Review of Systems: Review of systems as noted in the HPI. All other systems reviewed and are negative.   Past Medical History:  Diagnosis Date   Alcohol abuse    Asthma    Contracture of muscle of hand    right   GERD (gastroesophageal reflux disease)    HTN  (hypertension)    PUD (peptic ulcer disease)    remote   Seizures ([place])    since stroke, no recent seizures   Sleep apnea    Stroke [facility])    age 36, required brain surgery effected right side   Past Surgical History:  Procedure Laterality Date   ABDOMINAL HYSTERECTOMY  2004   complete   BIOPSY  10/06/2022   Procedure: BIOPSY;  Surgeon: Eloise Harman, DO;  Location: [facility];  Service: Endoscopy;;   BRAIN SURGERY     age 74 stroke   COLONOSCOPY     COLONOSCOPY WITH PROPOFOL  10/03/2012   Dr. [name]: moderate diverticulosis, small internal hemorrhoids, TUBULAR ADENOMA. Next colonoscopy 2023 per Dr. [name].   COLONOSCOPY WITH PROPOFOL N/A 10/06/2022   Procedure: COLONOSCOPY WITH PROPOFOL;  Surgeon: Eloise Harman, DO;  Location: [facility];  Service: Endoscopy;  Laterality: N/A;  9:15 am   ESOPHAGOGASTRODUODENOSCOPY N/A 10/18/2014   Dr. Gala Romney during inpatient hospitalization: severe exudative/erosive reflux esophagitis as source of trivial upper GI bleed. No varices    ESOPHAGOGASTRODUODENOSCOPY (EGD) WITH PROPOFOL  10/03/2012   Dr. [name]: stricture at [place] s/p dilation, mild gastritis negative H.pylori    ESOPHAGOGASTRODUODENOSCOPY (EGD) WITH PROPOFOL N/A 12/27/2017   Grade D esophagitis. Medium-sized hiatal hernia, normal duodenum   ESOPHAGOGASTRODUODENOSCOPY (EGD) WITH PROPOFOL N/A 10/06/2022   Procedure: ESOPHAGOGASTRODUODENOSCOPY (EGD) WITH PROPOFOL;  Surgeon: Eloise Harman, DO;  Location: [facility];  Service: Endoscopy;  Laterality: N/A;   POLYPECTOMY  10/06/2022   Procedure: POLYPECTOMY;  Surgeon: Eloise Harman, DO;  Location: [facility];  Service: Endoscopy;;   right tib-fib fracture  2008   SAVORY DILATION  10/03/2012   Procedure: SAVORY DILATION;  Surgeon: Danie Binder, MD;  Location: [facility] ORS;  Service: Endoscopy;  Laterality: N/A;  started at 1303,  dilated 12.8-16mm    Social History:  reports that she quit smoking about 8 years ago.  Her smoking use included cigarettes. She has a 0.36 pack-year smoking history. She has never used smokeless tobacco. She reports current alcohol use of about 35.0 standard drinks of alcohol per week. She reports that she does not use drugs.   No Known Allergies  Family History  Problem Relation Age of Onset   Liver disease Sister        etoh   Colon cancer Neg Hx    Colon polyps Neg Hx      Prior to Admission medications   Medication Sig Start Date End Date Taking? Authorizing Provider  acetaminophen (TYLENOL) 500 MG tablet Take 500 mg by mouth every 6 (six) hours as needed. As needed for pain   Yes [provider]  albuterol (VENTOLIN HFA) 108 (90 Base) MCG/ACT inhaler Inhale 2 puffs into the lungs every 6 (six) hours as needed for wheezing or shortness of breath. 08/06/22  Yes Emokpae, Courage, MD  HYDROcodone-acetaminophen (NORCO/VICODIN) 5-325 MG tablet One tablet every six hours for pain.  Limit 7 days. Patient taking differently: Take 1 tablet by mouth every 6 (six) hours as needed for moderate pain. One tablet every six hours for pain.  Limit 7 days. 12/29/22  Yes Sanjuana Kava, MD  lisinopril (ZESTRIL) 40 MG tablet Take 40 mg by mouth daily. 10/21/22  Yes [provider]  pantoprazole (PROTONIX) 40 MG tablet Take 1 tablet (40 mg total) by mouth daily for 7 days. Patient taking differently: Take 40 mg by mouth 2 (two) times daily. 10/09/22 01/06/23 Yes Mesner, Corene Cornea, MD  PHENobarbital (LUMINAL) 32.4 MG tablet Take 1 tablet (32.4 mg total) by mouth 2 (two) times daily. 08/06/22  Yes Emokpae, Courage, MD  phenytoin (DILANTIN) 200 MG ER capsule Take 1 capsule (200 mg total) by mouth 2 (two) times daily. Patient taking differently: Take 100 mg by mouth 3 (three) times daily. 08/06/22  Yes Emokpae, Courage, MD  solifenacin (VESICARE) 5 MG tablet Take 5 mg by mouth daily. 11/24/22  Yes [provider]  lactulose (CHRONULAC) 10 GM/15ML solution Take 15 mLs (10 g total)  by mouth daily. Patient not taking: Reported on 01/06/2023 08/30/22   Sherron Monday, NP  LINZESS 290 MCG CAPS capsule TAKE (1) CAPSULE BY MOUTH EVERY DAY. Patient not taking: Reported on 01/06/2023 10/26/21   Erenest Rasher, PA-C  Multiple Vitamin (MULTIVITAMIN WITH MINERALS) TABS tablet Take 1 tablet by mouth daily. Patient not taking: Reported on 01/06/2023 08/06/22   Roxan Hockey, MD  potassium chloride SA (KLOR-CON M) 20 MEQ tablet Take 2 tablets (40 mEq total) by mouth 2 (two) times daily for 5 days. 10/09/22 10/14/22  Mesner, Corene Cornea, MD  sucralfate (CARAFATE) 1 GM/10ML suspension Take 10 mLs (1 g total) by mouth 4 (four) times daily -  with meals and at bedtime. Patient not taking: Reported on 01/06/2023 08/06/22   Roxan Hockey, MD  thiamine (VITAMIN B-1) 100 MG tablet Take 1 tablet (100 mg total) by mouth daily. Patient not taking: Reported on 01/06/2023 08/07/22   Roxan Hockey, MD    Physical  Exam: BP (!) 137/59   Pulse (!) 110   Temp 98.7 F (37.1 C) (Oral)   Resp 16   Ht 5\' 3"  (1.6 m)   Wt 63.5 kg   SpO2 100%   BMI 24.80 kg/m   General: 67 y.o. year-old female chronically ill appearing but in no acute distress.  Alert and oriented x3. HEENT: NCAT, EOMI, dry mucous membrane Neck: Supple, trachea medial Cardiovascular: Regular rate and rhythm with no rubs or gallops.  No thyromegaly or JVD noted.  No lower extremity edema. 2/4 pulses in all 4 extremities. Respiratory: Clear to auscultation with no wheezes or rales. Good inspiratory effort. Abdomen: Soft, diffused tenderness over the abdomen without guarding.  Muskuloskeletal: No cyanosis, clubbing or edema noted bilaterally Neuro: CN II-XII intact, strength 5/5 x 4, sensation, reflexes intact Skin: No ulcerative lesions noted or rashes Psychiatry: Mood is appropriate for condition and setting          Labs on Admission:  Basic Metabolic Panel: Recent Labs  Lab 01/05/23 1838 01/05/23 1847  NA 130* 135  K  2.6* 2.6*  CL 92* 92*  CO2 24  --   GLUCOSE 135* 132*  BUN 6* 4*  CREATININE 1.18* 1.10*  CALCIUM 8.3*  --   MG 1.5*  --    Liver Function Tests: Recent Labs  Lab 01/05/23 1838  AST 77*  ALT 25  ALKPHOS 120  BILITOT 1.2  PROT 7.3  ALBUMIN 3.3*   Recent Labs  Lab 01/05/23 1838  LIPASE 26   No results for input(s): "AMMONIA" in the last 168 hours. CBC: Recent Labs  Lab 01/05/23 1838 01/05/23 1847 01/06/23 0907  WBC 4.2  --  5.8  NEUTROABS 3.0  --   --   HGB 11.0* 12.6 10.6*  HCT 33.0* 37.0 31.3*  MCV 81.3  --  81.9  PLT 154  --  131*   Cardiac Enzymes: No results for input(s): "CKTOTAL", "CKMB", "CKMBINDEX", "TROPONINI" in the last 168 hours.  BNP (last 3 results) No results for input(s): "BNP" in the last 8760 hours.  ProBNP (last 3 results) No results for input(s): "PROBNP" in the last 8760 hours.  CBG: No results for input(s): "GLUCAP" in the last 168 hours.  Radiological Exams on Admission: CT ANGIO ABDOMEN PELVIS  W &/OR WO CONTRAST  Result Date: 01/05/2023 CLINICAL DATA:  67 year old with lower GI bleed. EXAM: CTA ABDOMEN AND PELVIS WITHOUT AND WITH CONTRAST TECHNIQUE: Multidetector CT imaging of the abdomen and pelvis was performed using the standard protocol during bolus administration of intravenous contrast. Multiplanar reconstructed images and MIPs were obtained and reviewed to evaluate the vascular anatomy. RADIATION DOSE REDUCTION: This exam was performed according to the departmental dose-optimization program which includes automated exposure control, adjustment of the mA and/or kV according to patient size and/or use of iterative reconstruction technique. CONTRAST:  51mL OMNIPAQUE IOHEXOL 350 MG/ML SOLN COMPARISON:  CT abdomen and pelvis 08/04/2022 FINDINGS: VASCULAR Aorta: Normal caliber aorta without aneurysm, dissection, vasculitis or significant stenosis. Celiac: Patent without evidence of aneurysm, dissection, vasculitis or significant stenosis.  SMA: Patent without evidence of aneurysm, dissection, vasculitis or significant stenosis. Renals: Bilateral renal arteries are patent without aneurysm, dissection or significant stenosis. Single right renal artery. Two left renal arteries with a small accessory left renal artery. IMA: Patent Inflow: Patent without evidence of aneurysm, dissection, vasculitis or significant stenosis. Proximal Outflow: Proximal femoral arteries are patent bilaterally. Veins: Iliac veins, IVC and bilateral renal veins are patent. Portal venous system  is patent. Review of the MIP images confirms the above findings. NON-VASCULAR Lower chest: Lung bases are clear. Hepatobiliary: Liver has a mildly nodular contour and suggestive for underlying cirrhosis. No discrete liver lesion. No biliary dilatation. Normal appearance of the gallbladder. Pancreas: Unremarkable. No pancreatic ductal dilatation or surrounding inflammatory changes. Spleen: Normal in size without focal abnormality. Adrenals/Urinary Tract: Normal adrenal glands. Negative for kidney stones. No hydronephrosis. No suspicious renal lesions. Small amount of fluid in the urinary bladder. Mild bladder wall thickening is nonspecific and could be related to under distension. Stomach/Bowel: Multiple colonic diverticula. These colonic diverticula contain high density material and makes it difficult to evaluate for contrast extravasation. No evidence for acute GI bleeding. Normal appearance of the appendix. Normal appearance of the small bowel without dilatation. Wall thickening in the proximal stomach is likely secondary to under distension. No focal bowel inflammation. Lymphatic: No lymph node enlargement in the abdomen or pelvis. Reproductive: Status post hysterectomy. No adnexal masses. Other: Negative for free fluid.  Negative for free air. Musculoskeletal: Degenerative changes involving the left hip. No acute bone abnormality. Degenerative facet disease in lumbar spine. Mild  anterolisthesis of L4 on L5. IMPRESSION: VASCULAR 1. No evidence for acute GI bleeding. 2. Main visceral arteries are patent without significant stenosis. NON-VASCULAR 1. No acute abnormality in the abdomen or pelvis. 2. Colonic diverticulosis without acute inflammation and no evidence for active bleeding. 3. Mild bladder wall thickening is nonspecific and was seen on previous exam from 05/07/2022. This may represent a chronic finding but recommend correlation with urinalysis. 4. Liver has a nodular contour and suggestive for cirrhosis. Electronically Signed   By: Markus Daft M.D.   On: 01/05/2023 19:51    EKG: I independently viewed the EKG done and my findings are as followed: Normal sinus rhythm at a rate of 93 bpm  Assessment/Plan Present on Admission:  Acute kidney injury ([place])  AKI (acute kidney injury) ([place])  Lactic acidosis  Hypokalemia  Hypomagnesemia  Hyponatremia  Alcohol abuse  Principal Problem:   Acute kidney injury ([place]) Active Problems:   Alcohol abuse   Hypokalemia   AKI (acute kidney injury) ([place])   History of stroke   Hyponatremia   Lactic acidosis   Hypomagnesemia   UTI (urinary tract infection)   Hypoalbuminemia due to protein-calorie malnutrition ([facility])   History of seizures  Acute kidney injury Dehydration BUN 6, creatinine 1.18 (baseline creatinine at 0.5-0.6) Continue IV hydration Renally adjust medications, avoid nephrotoxic agents/dehydration/hypotension  Lactic acidosis possibly due to multifactorial including AKI and dehydration Lactic acid x 2 was flat at 3.7 Continue IV hydration and continue to trend lactic acid  Presumed UTI POA Patient was empirically started on IV ceftriaxone, we shall continue same at this time with plan to de-escalate/discontinue based on urine culture  Abdominal pain  will be admitted to [facility] Continue IV morphine 2 mg q.4h p.r.n. for moderate to severe pain Continue IV Zofran p.r.n.  Questionable GI bleed H/H11.0/33.0,  this was 11.2/33.8 on 10/08/2022, FOBT was negative Continue to monitor H/H and GI bleed.  Hypokalemia possibly due to vomiting K+ 2.6, this will be replenished  Hypomagnesemia Magnesium 1.5, this was replenished  Hyponatremia possibly secondary to beer potomania Continue IV hydration and continue to monitor sodium levels  Hypoalbuminemia possibly secondary to mild protein calorie malnutrition Albumin 3.3, protein supplement to be provided  Alcohol abuse Patient endorsed history of alcohol abuse, AST 77, ALT 25 (> 2:1) She denies any history of alcohol withdrawal Continue to  monitor outpatient for withdrawal symptoms and consider CIWA protocol for positive findings  History of stroke Patient had stroke at age 75 that required craniotomy Continue fall precaution  History of seizures Patient has had no recent seizures Continue home phenobarbital and phenytoin  Essential hypertension Continue lisinopril  GERD Continue Protonix   DVT prophylaxis: Heparin subcu (monitor for GI bleed and discontinue)  Code Status: Full code  Consults: None  Family Communication: None at bedside  Severity of Illness: The appropriate patient status for this patient is OBSERVATION. Observation status is judged to be reasonable and necessary in order to provide the required intensity of service to ensure the patient's safety. The patient's presenting symptoms, physical exam findings, and initial radiographic and laboratory data in the context of their medical condition is felt to place them at decreased risk for further clinical deterioration. Furthermore, it is anticipated that the patient will be medically stable for discharge from the hospital within 2 midnights of admission.   Author: Bernadette Hoit, DO 01/06/2023 9:27 AM  For on call review [email or web address].

## 2023-01-05 NOTE — ED Provider Notes (Incomplete)
[facility] Provider Note   CSN: [id number] Arrival date & time: 01/05/23  1805     History {Add pertinent medical, surgical, social history, OB history to HPI:1} Chief Complaint  Patient presents with  . Abdominal Pain    [name] is a 67 y.o. female.   Abdominal Pain Associated symptoms: nausea and vomiting   Patient presents for abdominal pain, nausea, vomiting, and bloody stools.  Medical history includes CVA, alcohol abuse, seizures, GERD, anemia, HTN.  Onset of symptoms was last night.  She describes 3 episodes of dark red stools.  She has had lower abdominal pain.  Current pain is described as 8/10 in severity.  At maximum, it was 10/10.  She endorses current nausea.  In terms of her alcohol use, patient states that she "drinks when she can get it".  Her last alcohol drink was last night.  She typically drinks beer and describes her normal daily intake as 6 beers.  She denies history of hospitalization for acute alcohol withdrawals.     Home Medications Prior to Admission medications   Medication Sig Start Date End Date Taking? Authorizing Provider  acetaminophen (TYLENOL) 500 MG tablet Take 500 mg by mouth every 6 (six) hours as needed. As needed for pain    [provider]  albuterol (VENTOLIN HFA) 108 (90 Base) MCG/ACT inhaler Inhale 2 puffs into the lungs every 6 (six) hours as needed for wheezing or shortness of breath. 08/06/22   Roxan Hockey, MD  folic acid (FOLVITE) A999333 MCG tablet Take 400 mcg by mouth daily.    [provider]  HYDROcodone-acetaminophen (NORCO/VICODIN) 5-325 MG tablet One tablet every six hours for pain.  Limit 7 days. 12/29/22   Sanjuana Kava, MD  lactulose (CHRONULAC) 10 GM/15ML solution Take 15 mLs (10 g total) by mouth daily. 08/30/22   Sherron Monday, NP  LINZESS 290 MCG CAPS capsule TAKE (1) CAPSULE BY MOUTH EVERY DAY. 10/26/21   Erenest Rasher, PA-C  Multiple Vitamin  (MULTIVITAMIN WITH MINERALS) TABS tablet Take 1 tablet by mouth daily. 08/06/22   Roxan Hockey, MD  pantoprazole (PROTONIX) 40 MG tablet Take 1 tablet (40 mg total) by mouth daily for 7 days. 10/09/22 10/16/22  Mesner, Corene Cornea, MD  PHENobarbital (LUMINAL) 32.4 MG tablet Take 1 tablet (32.4 mg total) by mouth 2 (two) times daily. 08/06/22   Roxan Hockey, MD  phenytoin (DILANTIN) 200 MG ER capsule Take 1 capsule (200 mg total) by mouth 2 (two) times daily. 08/06/22   Roxan Hockey, MD  potassium chloride SA (KLOR-CON M) 20 MEQ tablet Take 2 tablets (40 mEq total) by mouth 2 (two) times daily for 5 days. 10/09/22 10/14/22  Mesner, Corene Cornea, MD  sucralfate (CARAFATE) 1 GM/10ML suspension Take 10 mLs (1 g total) by mouth 4 (four) times daily -  with meals and at bedtime. 08/06/22   Roxan Hockey, MD  thiamine (VITAMIN B-1) 100 MG tablet Take 1 tablet (100 mg total) by mouth daily. 08/07/22   Roxan Hockey, MD      Allergies    Patient has no known allergies.    Review of Systems   Review of Systems  Gastrointestinal:  Positive for abdominal pain, blood in stool, nausea and vomiting.  All other systems reviewed and are negative.   Physical Exam Updated Vital Signs There were no vitals taken for this visit. Physical Exam Vitals and nursing note reviewed.  Constitutional:      General: She  is not in acute distress.    Appearance: She is well-developed. She is ill-appearing (Chronically). She is not toxic-appearing or diaphoretic.  HENT:     Head: Normocephalic and atraumatic.     Mouth/Throat:     Mouth: Mucous membranes are moist.  Eyes:     General: No scleral icterus.    Extraocular Movements: Extraocular movements intact.     Conjunctiva/sclera: Conjunctivae normal.  Cardiovascular:     Rate and Rhythm: Normal rate and regular rhythm.     Heart sounds: No murmur heard. Pulmonary:     Effort: Pulmonary effort is normal. No respiratory distress.  Abdominal:     General:  There is no distension.     Palpations: Abdomen is soft.     Tenderness: There is generalized abdominal tenderness. There is no guarding or rebound.  Musculoskeletal:        General: No swelling.     Cervical back: Neck supple.  Skin:    General: Skin is warm and dry.     Capillary Refill: Capillary refill takes less than 2 seconds.     Coloration: Skin is not cyanotic, jaundiced or pale.  Neurological:     General: No focal deficit present.     Mental Status: She is alert and oriented to person, place, and time.  Psychiatric:        Mood and Affect: Mood normal.        Behavior: Behavior normal.     ED Results / Procedures / Treatments   Labs (all labs ordered are listed, but only abnormal results are displayed) Labs Reviewed - No data to display  EKG None  Radiology No results found.  Procedures Procedures  {Document cardiac monitor, telemetry assessment procedure when appropriate:1}  Medications Ordered in ED Medications - No data to display  ED Course/ Medical Decision Making/ A&P   {   Click here for ABCD2, HEART and other calculatorsREFRESH Note before signing :1}                          Medical Decision Making Amount and/or Complexity of Data Reviewed Labs: ordered. Radiology: ordered.  Risk Prescription drug management. Decision regarding hospitalization.   This patient presents to the ED for concern of abdominal pain, this involves an extensive number of treatment options, and is a complaint that carries with it a high risk of complications and morbidity.  The differential diagnosis includes colitis, enteritis, pancreatitis, gastritis, mesenteric ischemia, bowel obstruction   Co morbidities that complicate the patient evaluation  CVA, alcohol abuse, seizures, GERD, anemia, HTN   Additional history obtained:  Additional history obtained from N/A External records from outside source obtained and reviewed including EMR   Lab Tests:  I  Ordered, and personally interpreted labs.  The pertinent results include:  ***   Imaging Studies ordered:  I ordered imaging studies including ***  I independently visualized and interpreted imaging which showed *** I agree with the radiologist interpretation   Cardiac Monitoring: / EKG:  The patient was maintained on a cardiac monitor.  I personally viewed and interpreted the cardiac monitored which showed an underlying rhythm of: ***   Consultations Obtained:  I requested consultation with the ***,  and discussed lab and imaging findings as well as pertinent plan - they recommend: ***   Problem List / ED Course / Critical interventions / Medication management  *** I ordered medication including ***  for ***  Reevaluation of  the patient after these medicines showed that the patient {resolved/improved/worsened:23923::"improved"} I have reviewed the patients home medicines and have made adjustments as needed   Social Determinants of Health:  ***   Test / Admission - Considered:  ***   {Document critical care time when appropriate:1} {Document review of labs and clinical decision tools ie heart score, Chads2Vasc2 etc:1}  {Document your independent review of radiology images, and any outside records:1} {Document your discussion with family members, caretakers, and with consultants:1} {Document social determinants of health affecting pt's care:1} {Document your decision making why or why not admission, treatments were needed:1} Final Clinical Impression(s) / ED Diagnoses Final diagnoses:  None    Rx / DC Orders ED Discharge Orders     None

## 2023-01-05 NOTE — ED Notes (Addendum)
Stool for OB Negative. Dr Doren Custard made aware.

## 2023-01-05 NOTE — Progress Notes (Deleted)
GI Office Note    Referring Provider: Carrolyn Meiers* Primary Care Physician:  Carrolyn Meiers, MD Primary Gastroenterologist: Elon Alas. Abbey Chatters, DO  Date:  01/05/2023  ID:  [name], DOB [birth year], MRN [id number]   Chief Complaint   No chief complaint on file.   History of Present Illness  [name] is a 67 y.o. female with a history of alcoholic cirrhosis, anemia, reflux esophagitis, HTN, PUD, seizures, stroke, GERD, gallbladder polyp presenting today for follow-up of cirrhosis and abdominal pain post procedures.  Last colonoscopy in December 2013 with moderate diverticulosis noted throughout the entire colon, small internal hemorrhoids, otherwise normal.  Advise repeat in 10 years.   Last EGD in March 2019 with reflux esophagitis, medium size hiatal hernia.   Last office visit 01/07/2021.  Presented to discuss rescheduling her colonoscopy.  Had 2 previous failed attempts prior to this.  Had a recent hospitalization earlier that month for confusion, nausea and vomiting.  Had positive alcohol level.  Negative CT head.  CT A/P with cirrhosis, unremarkable gallbladder, severe distal colonic diverticulosis.  Labs are stable with hemoglobin 9-10 range.  At time of visit patient reported a good appetite, denied reflux.  Reported centralized abdominal pain worsened with meals.  When asked some improvement of pain with bowel movements.  On Linzess daily.  Denies melena or BRBPR.  Caregiver reported possible drinking after she leaves for her shift.  Alcohol cessation recommended.  Colonoscopy scheduled.  Advised to continue pantoprazole 40 mg twice daily.   Multiple ED visits with a couple of hospital admissions for abdominal pain, gastritis, and diverticulitis.   Most recent hospital admission 08/03/2022 for confusion and generalized weakness and abdominal discomfort.  Caregiver reported patient continue to drink 6-8 beers per day.  CT head without any acute  findings both evidence of atrophy, small vessel disease.  Previous right-sided healed craniotomy.  CT A/P without any acute intra-abdominal abnormalities but with evidence of cirrhosis.  RUQ ultrasound consistent with liver cirrhosis without any other acute findings.  Ammonia was 37.  Potassium 2.4, sodium 121, AST 742, ALT 219, creatinine 0.53, normal bilirubin and alk phos.  Negative viral hepatitis panel.  Mentation improved with lactulose.  INR was stable at 1.2-1.3.  Mild thrombocytopenia 130-143.   ED visit 08/19/2022 for abdominal pain. Suspected to be secondary to alcohol intake.  Lipase normal.  Normal magnesium.  Potassium 2.7, sodium 130, creatinine 0.63, AST 76, ALT 28, alk phos 144, bilirubin 0.7, albumin 4, ethanol 173, hemoglobin 10.9 (stable), platelets 178.  Potassium repleted with oral and IV.  Given PPI injection.  Again advised alcohol cessation.  Last office visit 08/30/22. ***  Last 10/08/2022: Hemoglobin 11.2, MCV 84.5, platelets clumped but count appeared adequate per report (no numerical number).  Sodium 1 3, potassium 3, BUN 5, creatinine 0.5, albumin 3.3, AST 67, ALT 23, alk phos 123, T. bili 0.4.  Lipase 47  EGD 10/06/2022: -3cm hiatal hernia -LA grade C erosive esophagitis -mild Schatzki ring -gastritis s/p biopsy (no h. Pylori) -normal duodenum -Avoid NSAIDs, use PPI BID  Colonoscopy 10/06/2022: -pancolonic diverticulosis -87mm polyp in transverse colon -62mm polyp in the sigmoid colon -polyps with 1 tubular adenoma and one hyperplastic -repeat in 5 years  Today: Cirrhosis history Hematemesis/coffee ground emesis: *** History of variceal bleeding: *** Abdominal pain: *** Abdominal distention/worsening ascites: *** Fever/chills: *** Episodes of confusion/disorientation: *** Number of daily bowel movements: *** Taking diuretics?: *** Date of last EGD: *** Prior history of banding?: ***  Prior episodes of SBP: *** Last time liver imaging was  performed:***  Hepatitis A and B vaccination status: ***  MELD 3.0 score: 11 (09/23/22) MELD Na: 7      Current Outpatient Medications  Medication Sig Dispense Refill   acetaminophen (TYLENOL) 500 MG tablet Take 500 mg by mouth every 6 (six) hours as needed. As needed for pain     albuterol (VENTOLIN HFA) 108 (90 Base) MCG/ACT inhaler Inhale 2 puffs into the lungs every 6 (six) hours as needed for wheezing or shortness of breath. 18 g 2   folic acid (FOLVITE) A999333 MCG tablet Take 400 mcg by mouth daily.     HYDROcodone-acetaminophen (NORCO/VICODIN) 5-325 MG tablet One tablet every six hours for pain.  Limit 7 days. 28 tablet 0   lactulose (CHRONULAC) 10 GM/15ML solution Take 15 mLs (10 g total) by mouth daily. 473 mL 3   LINZESS 290 MCG CAPS capsule TAKE (1) CAPSULE BY MOUTH EVERY DAY. 30 capsule 5   Multiple Vitamin (MULTIVITAMIN WITH MINERALS) TABS tablet Take 1 tablet by mouth daily. 100 tablet 1   pantoprazole (PROTONIX) 40 MG tablet Take 1 tablet (40 mg total) by mouth daily for 7 days. 7 tablet 0   PHENobarbital (LUMINAL) 32.4 MG tablet Take 1 tablet (32.4 mg total) by mouth 2 (two) times daily. 180 tablet 1   phenytoin (DILANTIN) 200 MG ER capsule Take 1 capsule (200 mg total) by mouth 2 (two) times daily. 60 capsule 3   potassium chloride SA (KLOR-CON M) 20 MEQ tablet Take 2 tablets (40 mEq total) by mouth 2 (two) times daily for 5 days. 20 tablet 0   sucralfate (CARAFATE) 1 GM/10ML suspension Take 10 mLs (1 g total) by mouth 4 (four) times daily -  with meals and at bedtime. 420 mL 0   thiamine (VITAMIN B-1) 100 MG tablet Take 1 tablet (100 mg total) by mouth daily. 30 tablet 1   No current facility-administered medications for this visit.    Past Medical History:  Diagnosis Date   Alcohol abuse    Asthma    Contracture of muscle of hand    right   GERD (gastroesophageal reflux disease)    HTN (hypertension)    PUD (peptic ulcer disease)    remote   Seizures ([place])     since stroke, no recent seizures   Sleep apnea    Stroke [facility])    age 9, required brain surgery effected right side    Past Surgical History:  Procedure Laterality Date   ABDOMINAL HYSTERECTOMY  2004   complete   BIOPSY  10/06/2022   Procedure: BIOPSY;  Surgeon: Eloise Harman, DO;  Location: [facility];  Service: Endoscopy;;   BRAIN SURGERY     age 18 stroke   COLONOSCOPY     COLONOSCOPY WITH PROPOFOL  10/03/2012   Dr. [name]: moderate diverticulosis, small internal hemorrhoids, TUBULAR ADENOMA. Next colonoscopy 2023 per Dr. [name].   COLONOSCOPY WITH PROPOFOL N/A 10/06/2022   Procedure: COLONOSCOPY WITH PROPOFOL;  Surgeon: Eloise Harman, DO;  Location: [facility];  Service: Endoscopy;  Laterality: N/A;  9:15 am   ESOPHAGOGASTRODUODENOSCOPY N/A 10/18/2014   Dr. Gala Romney during inpatient hospitalization: severe exudative/erosive reflux esophagitis as source of trivial upper GI bleed. No varices    ESOPHAGOGASTRODUODENOSCOPY (EGD) WITH PROPOFOL  10/03/2012   Dr. [name]: stricture at [place] junction s/p dilation, mild gastritis negative H.pylori    ESOPHAGOGASTRODUODENOSCOPY (EGD) WITH PROPOFOL N/A 12/27/2017   Grade  D esophagitis. Medium-sized hiatal hernia, normal duodenum   ESOPHAGOGASTRODUODENOSCOPY (EGD) WITH PROPOFOL N/A 10/06/2022   Procedure: ESOPHAGOGASTRODUODENOSCOPY (EGD) WITH PROPOFOL;  Surgeon: Eloise Harman, DO;  Location: [facility];  Service: Endoscopy;  Laterality: N/A;   POLYPECTOMY  10/06/2022   Procedure: POLYPECTOMY;  Surgeon: Eloise Harman, DO;  Location: [facility];  Service: Endoscopy;;   right tib-fib fracture  2008   SAVORY DILATION  10/03/2012   Procedure: SAVORY DILATION;  Surgeon: Danie Binder, MD;  Location: [facility];  Service: Endoscopy;  Laterality: N/A;  started at 1303,  dilated 12.8-16mm    Family History  Problem Relation Age of Onset   Liver disease Sister        etoh   Colon cancer Neg Hx    Colon polyps Neg Hx      Allergies as of 01/06/2023   (No Known Allergies)    Social History   Socioeconomic History   Marital status: Widowed    Spouse name: Not on file   Number of children: 3   Years of education: Not on file   Highest education level: Not on file  Occupational History   Occupation: disability    Employer: UNEMPLOYED  Tobacco Use   Smoking status: Former    Packs/day: 0.04    Years: 9.00    Additional pack years: 0.00    Total pack years: 0.36    Types: Cigarettes    Quit date: 07/28/2014    Years since quitting: 8.4   Smokeless tobacco: Never  Vaping Use   Vaping Use: Never used  Substance and Sexual Activity   Alcohol use: Yes    Alcohol/week: 35.0 standard drinks of alcohol    Types: 35 Cans of beer per week    Comment: Patient reports she drinks 6-8 beers each day   Drug use: No   Sexual activity: Yes    Birth control/protection: Surgical  Other Topics Concern   Not on file  Social History Narrative   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 HAS 3 KIDS: ONE IN [place], ONE IN [place], AND ONE OOT.   Social Determinants of Health   Financial Resource Strain: Not on file  Food Insecurity: Not on file  Transportation Needs: Not on file  Physical Activity: Not on file  Stress: Not on file  Social Connections: Not on file     Review of Systems   Gen: Denies fever, chills, anorexia. Denies fatigue, weakness, weight loss.  CV: Denies chest pain, palpitations, syncope, peripheral edema, and claudication. Resp: Denies dyspnea at rest, cough, wheezing, coughing up blood, and pleurisy. GI: See HPI Derm: Denies rash, itching, dry skin Psych: Denies depression, anxiety, memory loss, confusion. No homicidal or suicidal ideation.  Heme: Denies bruising, bleeding, and enlarged lymph nodes.   Physical Exam   There were no vitals taken for this visit.  General:   Alert and oriented. No distress noted. Pleasant and cooperative.  Head:  Normocephalic and atraumatic. Eyes:   Conjuctiva clear without scleral icterus. Mouth:  Oral mucosa pink and moist. Good dentition. No lesions. Lungs:  Clear to auscultation bilaterally. No wheezes, rales, or rhonchi. No distress.  Heart:  S1, S2 present without murmurs appreciated.  Abdomen:  +BS, soft, non-tender and non-distended. No rebound or guarding. No HSM or masses noted. Rectal: *** Msk:  Symmetrical without gross deformities. Normal posture. Extremities:  Without edema. Neurologic:  Alert and  oriented x4 Psych:  Alert and cooperative. Normal mood and  affect.   Assessment  [name] is a 67 y.o. female with a history of alcoholic cirrhosis, anemia, reflux esophagitis, HTN, PUD, seizures, stroke, GERD, gallbladder polyp presenting today for follow-up of cirrhosis and abdominal pain post procedures.  Alcoholic cirrhosis:  Abdominal pain:   PLAN   *** CBC, CMP, INR, AFP RUQ [place] Alcohol cessation Continue pantoprazole 40 mg daily Continue Linzess to 90 mcg daily Continue lactulose ***, titrate for 2-3 bowel movements daily High-protein diet from a primarily plant-based diet. Avoid red meat.  No raw or undercooked meat, seafood, or shellfish. Low-fat/cholesterol/carbohydrate diet. Limit sodium to no more than 2000 mg/day including everything that you eat and drink. Recommend at least 30 minutes of aerobic and resistance exercise 3 days/week. Limit Tylenol to 2000 mg daily.       Venetia Night, MSN, FNP-BC, AGACNP-BC [facility]

## 2023-01-05 NOTE — ED Notes (Signed)
Patient transported to CT 

## 2023-01-05 NOTE — ED Provider Notes (Signed)
[facility] Provider Note   CSN: [id number] Arrival date & time: 01/05/23  1805     History {Add pertinent medical, surgical, social history, OB history to HPI:1} Chief Complaint  Patient presents with   Abdominal Pain    [name] is a 67 y.o. female.   Abdominal Pain Patient presents for***.  Medical history includes CVA, alcohol abuse, seizures, GERD, anemia, HTN.     Home Medications Prior to Admission medications   Medication Sig Start Date End Date Taking? Authorizing Provider  acetaminophen (TYLENOL) 500 MG tablet Take 500 mg by mouth every 6 (six) hours as needed. As needed for pain    [provider]  albuterol (VENTOLIN HFA) 108 (90 Base) MCG/ACT inhaler Inhale 2 puffs into the lungs every 6 (six) hours as needed for wheezing or shortness of breath. 08/06/22   Roxan Hockey, MD  folic acid (FOLVITE) A999333 MCG tablet Take 400 mcg by mouth daily.    [provider]  HYDROcodone-acetaminophen (NORCO/VICODIN) 5-325 MG tablet One tablet every six hours for pain.  Limit 7 days. 12/29/22   Sanjuana Kava, MD  lactulose (CHRONULAC) 10 GM/15ML solution Take 15 mLs (10 g total) by mouth daily. 08/30/22   Sherron Monday, NP  LINZESS 290 MCG CAPS capsule TAKE (1) CAPSULE BY MOUTH EVERY DAY. 10/26/21   Erenest Rasher, PA-C  Multiple Vitamin (MULTIVITAMIN WITH MINERALS) TABS tablet Take 1 tablet by mouth daily. 08/06/22   Roxan Hockey, MD  pantoprazole (PROTONIX) 40 MG tablet Take 1 tablet (40 mg total) by mouth daily for 7 days. 10/09/22 10/16/22  Mesner, Corene Cornea, MD  PHENobarbital (LUMINAL) 32.4 MG tablet Take 1 tablet (32.4 mg total) by mouth 2 (two) times daily. 08/06/22   Roxan Hockey, MD  phenytoin (DILANTIN) 200 MG ER capsule Take 1 capsule (200 mg total) by mouth 2 (two) times daily. 08/06/22   Roxan Hockey, MD  potassium chloride SA (KLOR-CON M) 20 MEQ tablet Take 2 tablets (40 mEq total) by  mouth 2 (two) times daily for 5 days. 10/09/22 10/14/22  Mesner, Corene Cornea, MD  sucralfate (CARAFATE) 1 GM/10ML suspension Take 10 mLs (1 g total) by mouth 4 (four) times daily -  with meals and at bedtime. 08/06/22   Roxan Hockey, MD  thiamine (VITAMIN B-1) 100 MG tablet Take 1 tablet (100 mg total) by mouth daily. 08/07/22   Roxan Hockey, MD      Allergies    Patient has no known allergies.    Review of Systems   Review of Systems  Gastrointestinal:  Positive for abdominal pain.    Physical Exam Updated Vital Signs There were no vitals taken for this visit. Physical Exam  ED Results / Procedures / Treatments   Labs (all labs ordered are listed, but only abnormal results are displayed) Labs Reviewed - No data to display  EKG None  Radiology No results found.  Procedures Procedures  {Document cardiac monitor, telemetry assessment procedure when appropriate:1}  Medications Ordered in ED Medications - No data to display  ED Course/ Medical Decision Making/ A&P   {   Click here for ABCD2, HEART and other calculatorsREFRESH Note before signing :1}                          Medical Decision Making  ***  {Document critical care time when appropriate:1} {Document review of labs and clinical decision tools ie heart score, Chads2Vasc2 etc:1}  {  Document your independent review of radiology images, and any outside records:1} {Document your discussion with family members, caretakers, and with consultants:1} {Document social determinants of health affecting pt's care:1} {Document your decision making why or why not admission, treatments were needed:1} Final Clinical Impression(s) / ED Diagnoses Final diagnoses:  None    Rx / DC Orders ED Discharge Orders     None

## 2023-01-05 NOTE — ED Triage Notes (Signed)
EMS called out for ABD pain and diarrhea. Pt reports blood in stool and stool is dark. Bp 82/48. After 264ml fluid bp 78/45. Pt has been hypotensive with ems. Bloody stools have been happening since yesterday. Has history of ETOH abuse and drank yesterday

## 2023-01-06 ENCOUNTER — Ambulatory Visit: Payer: 59 | Admitting: Gastroenterology

## 2023-01-06 DIAGNOSIS — N3090 Cystitis, unspecified without hematuria: Secondary | ICD-10-CM | POA: Diagnosis present

## 2023-01-06 DIAGNOSIS — I10 Essential (primary) hypertension: Secondary | ICD-10-CM | POA: Diagnosis present

## 2023-01-06 DIAGNOSIS — E876 Hypokalemia: Secondary | ICD-10-CM | POA: Diagnosis present

## 2023-01-06 DIAGNOSIS — G4730 Sleep apnea, unspecified: Secondary | ICD-10-CM | POA: Diagnosis present

## 2023-01-06 DIAGNOSIS — E860 Dehydration: Secondary | ICD-10-CM | POA: Diagnosis present

## 2023-01-06 DIAGNOSIS — Z8719 Personal history of other diseases of the digestive system: Secondary | ICD-10-CM | POA: Diagnosis not present

## 2023-01-06 DIAGNOSIS — N390 Urinary tract infection, site not specified: Secondary | ICD-10-CM | POA: Insufficient documentation

## 2023-01-06 DIAGNOSIS — D649 Anemia, unspecified: Secondary | ICD-10-CM | POA: Diagnosis present

## 2023-01-06 DIAGNOSIS — K921 Melena: Secondary | ICD-10-CM | POA: Diagnosis present

## 2023-01-06 DIAGNOSIS — Z87891 Personal history of nicotine dependence: Secondary | ICD-10-CM | POA: Diagnosis not present

## 2023-01-06 DIAGNOSIS — F1020 Alcohol dependence, uncomplicated: Secondary | ICD-10-CM | POA: Diagnosis present

## 2023-01-06 DIAGNOSIS — E872 Acidosis, unspecified: Secondary | ICD-10-CM | POA: Diagnosis present

## 2023-01-06 DIAGNOSIS — E441 Mild protein-calorie malnutrition: Secondary | ICD-10-CM | POA: Diagnosis present

## 2023-01-06 DIAGNOSIS — Z79899 Other long term (current) drug therapy: Secondary | ICD-10-CM | POA: Diagnosis not present

## 2023-01-06 DIAGNOSIS — Z87898 Personal history of other specified conditions: Secondary | ICD-10-CM

## 2023-01-06 DIAGNOSIS — N179 Acute kidney failure, unspecified: Secondary | ICD-10-CM | POA: Diagnosis present

## 2023-01-06 DIAGNOSIS — Z8673 Personal history of transient ischemic attack (TIA), and cerebral infarction without residual deficits: Secondary | ICD-10-CM | POA: Diagnosis not present

## 2023-01-06 DIAGNOSIS — I959 Hypotension, unspecified: Secondary | ICD-10-CM | POA: Diagnosis present

## 2023-01-06 DIAGNOSIS — E8809 Other disorders of plasma-protein metabolism, not elsewhere classified: Secondary | ICD-10-CM | POA: Diagnosis present

## 2023-01-06 DIAGNOSIS — Z90710 Acquired absence of both cervix and uterus: Secondary | ICD-10-CM | POA: Diagnosis not present

## 2023-01-06 DIAGNOSIS — Z8711 Personal history of peptic ulcer disease: Secondary | ICD-10-CM | POA: Diagnosis not present

## 2023-01-06 DIAGNOSIS — E871 Hypo-osmolality and hyponatremia: Secondary | ICD-10-CM | POA: Diagnosis present

## 2023-01-06 DIAGNOSIS — J45909 Unspecified asthma, uncomplicated: Secondary | ICD-10-CM | POA: Diagnosis present

## 2023-01-06 DIAGNOSIS — K219 Gastro-esophageal reflux disease without esophagitis: Secondary | ICD-10-CM | POA: Diagnosis present

## 2023-01-06 DIAGNOSIS — K7460 Unspecified cirrhosis of liver: Secondary | ICD-10-CM | POA: Diagnosis present

## 2023-01-06 LAB — CBC
HCT: 31.3 % — ABNORMAL LOW (ref 36.0–46.0)
Hemoglobin: 10.6 g/dL — ABNORMAL LOW (ref 12.0–15.0)
MCH: 27.7 pg (ref 26.0–34.0)
MCHC: 33.9 g/dL (ref 30.0–36.0)
MCV: 81.9 fL (ref 80.0–100.0)
Platelets: 131 10*3/uL — ABNORMAL LOW (ref 150–400)
RBC: 3.82 MIL/uL — ABNORMAL LOW (ref 3.87–5.11)
RDW: 16.1 % — ABNORMAL HIGH (ref 11.5–15.5)
WBC: 5.8 10*3/uL (ref 4.0–10.5)
nRBC: 0 % (ref 0.0–0.2)

## 2023-01-06 LAB — BASIC METABOLIC PANEL
Anion gap: 11 (ref 5–15)
BUN: 5 mg/dL — ABNORMAL LOW (ref 8–23)
CO2: 24 mmol/L (ref 22–32)
Calcium: 8.5 mg/dL — ABNORMAL LOW (ref 8.9–10.3)
Chloride: 101 mmol/L (ref 98–111)
Creatinine, Ser: 0.83 mg/dL (ref 0.44–1.00)
GFR, Estimated: >60 mL/min (ref >60)
Glucose, Bld: 115 mg/dL — ABNORMAL HIGH (ref 70–99)
Potassium: 3.8 mmol/L (ref 3.5–5.1)
Sodium: 136 mmol/L (ref 135–145)

## 2023-01-06 LAB — LACTIC ACID, PLASMA: Lactic Acid, Venous: 2.1 mmol/L (ref 0.5–1.9)

## 2023-01-06 LAB — POC OCCULT BLOOD, ED: Fecal Occult Bld: NEGATIVE

## 2023-01-06 MED ORDER — ONDANSETRON HCL 4 MG PO TABS: 4.0000 mg | ORAL_TABLET | Freq: Four times a day (QID) | ORAL | Status: DC | PRN

## 2023-01-06 MED ORDER — POTASSIUM CHLORIDE IN NACL 20-0.9 MEQ/L-% IV SOLN: INTRAVENOUS | Status: DC

## 2023-01-06 MED ORDER — POTASSIUM CHLORIDE CRYS ER 20 MEQ PO TBCR
40.0000 meq | EXTENDED_RELEASE_TABLET | Freq: Once | ORAL | Status: AC
  Administered 2023-01-06: 40 meq via ORAL
  Filled 2023-01-06: qty 2

## 2023-01-06 MED ORDER — ACETAMINOPHEN 650 MG RE SUPP: 650.0000 mg | Freq: Four times a day (QID) | RECTAL | Status: DC | PRN

## 2023-01-06 MED ORDER — ENOXAPARIN SODIUM 40 MG/0.4ML IJ SOSY: 40.0000 mg | PREFILLED_SYRINGE | INTRAMUSCULAR | Status: DC

## 2023-01-06 MED ORDER — LISINOPRIL 10 MG PO TABS
40.0000 mg | ORAL_TABLET | Freq: Every day | ORAL | Status: DC
  Administered 2023-01-06: 40 mg via ORAL
  Filled 2023-01-06: qty 4

## 2023-01-06 MED ORDER — MAGNESIUM SULFATE 2 GM/50ML IV SOLN
2.0000 g | Freq: Once | INTRAVENOUS | Status: AC
  Administered 2023-01-06: 2 g via INTRAVENOUS
  Filled 2023-01-06: qty 50

## 2023-01-06 MED ORDER — SODIUM CHLORIDE 0.9 % IV SOLN: INTRAVENOUS | Status: DC

## 2023-01-06 MED ORDER — SODIUM CHLORIDE 0.9 % IV SOLN: INTRAVENOUS | Status: DC | PRN

## 2023-01-06 MED ORDER — ENSURE ENLIVE PO LIQD
237.0000 mL | Freq: Two times a day (BID) | ORAL | Status: DC
  Administered 2023-01-06 – 2023-01-08 (×4): 237 mL via ORAL

## 2023-01-06 MED ORDER — ACETAMINOPHEN 325 MG PO TABS
650.0000 mg | ORAL_TABLET | Freq: Four times a day (QID) | ORAL | Status: DC | PRN
  Administered 2023-01-06: 650 mg via ORAL
  Filled 2023-01-06 (×2): qty 2

## 2023-01-06 MED ORDER — PHENYTOIN SODIUM EXTENDED 100 MG PO CAPS
200.0000 mg | ORAL_CAPSULE | Freq: Two times a day (BID) | ORAL | Status: DC
  Administered 2023-01-06 – 2023-01-08 (×5): 200 mg via ORAL
  Filled 2023-01-06 (×5): qty 2

## 2023-01-06 MED ORDER — SODIUM CHLORIDE 0.9 % IV SOLN
1.0000 g | INTRAVENOUS | Status: DC
  Administered 2023-01-06 – 2023-01-07 (×2): 1 g via INTRAVENOUS
  Filled 2023-01-06 (×2): qty 10

## 2023-01-06 MED ORDER — MORPHINE SULFATE (PF) 2 MG/ML IV SOLN: 2.0000 mg | INTRAVENOUS | Status: DC | PRN

## 2023-01-06 MED ORDER — ONDANSETRON HCL 4 MG/2ML IJ SOLN: 4.0000 mg | Freq: Four times a day (QID) | INTRAMUSCULAR | Status: DC | PRN

## 2023-01-06 MED ORDER — PHENOBARBITAL 32.4 MG PO TABS
32.4000 mg | ORAL_TABLET | Freq: Two times a day (BID) | ORAL | Status: DC
  Administered 2023-01-06 – 2023-01-08 (×5): 32.4 mg via ORAL
  Filled 2023-01-06 (×5): qty 1

## 2023-01-06 NOTE — Plan of Care (Signed)
  Problem: Acute Rehab PT Goals(only PT should resolve) Goal: Pt Will Go Supine/Side To Sit Outcome: Progressing Flowsheets (Taken 01/06/2023 1521) Pt will go Supine/Side to Sit:  with supervision  with min guard assist Goal: Patient Will Transfer Sit To/From Stand Outcome: Progressing Flowsheets (Taken 01/06/2023 1521) Patient will transfer sit to/from stand:  with min guard assist  with minimal assist Goal: Pt Will Transfer Bed To Chair/Chair To Bed Outcome: Progressing Flowsheets (Taken 01/06/2023 1521) Pt will Transfer Bed to Chair/Chair to Bed:  with supervision  min guard assist Goal: Pt Will Ambulate Outcome: Progressing Flowsheets (Taken 01/06/2023 1521) Pt will Ambulate:  50 feet  with min guard assist  with cane   3:21 PM, 01/06/23 Lonell Grandchild, MPT Physical Therapist with [facility] 336 [phone] office [phone] mobile phone

## 2023-01-06 NOTE — Evaluation (Signed)
Physical Therapy Evaluation Patient Details Name: [name] MRN: [id number] DOB: [birth year] Today's Date: 01/06/2023  History of Present Illness  [name] is a 67 y.o. female with medical history significant of stroke at the age of 79 (required craniotomy), GERD, PUD, longstanding history of seizures who presents to the emergency department due to abdominal pain, nausea, vomiting which started last night.  Patient endorsed 3 episodes of dark red stools and blood in vomitus.  Abdominal pain is in lower quadrants, pain was intermittent and was rated as 8/10 on pain scale.  Last alcohol drink (beer) was about 24 hours ago.  She endorsed to about 6 beers daily and denies history of alcohol withdrawal.   Clinical Impression  Patient has difficulty sitting up at bedside requiring Min assist, able to ambulate to bathroom with frequent leaning on wall while using single point cane [facility]) and required frequent hand held assist to avoid loss of balance.  Patient demonstrated improvement for walking back to bedside using cane, but overall has most difficulty completing sit to stands.  Patient tolerated sitting up in chair after therapy - nursing staff notified.  Patient will benefit from continued skilled physical therapy in hospital and recommended venue below to increase strength, balance, endurance for safe ADLs and gait.         Recommendations for follow up therapy are one component of a multi-disciplinary discharge planning process, led by the attending physician.  Recommendations may be updated based on patient status, additional functional criteria and insurance authorization.  Follow Up Recommendations Home health PT      Assistance Recommended at Discharge Set up Supervision/Assistance  Patient can return home with the following  A little help with walking and/or transfers;Help with stairs or ramp for entrance;A little help with bathing/dressing/bathroom;Assistance with  cooking/housework    Equipment Recommendations None recommended by PT  Recommendations for Other Services       Functional Status Assessment Patient has had a recent decline in their functional status and demonstrates the ability to make significant improvements in function in a reasonable and predictable amount of time.     Precautions / Restrictions Precautions Precautions: Fall Restrictions Weight Bearing Restrictions: No      Mobility  Bed Mobility Overal bed mobility: Needs Assistance Bed Mobility: Supine to Sit     Supine to sit: Min assist     General bed mobility comments: slow labored movement    Transfers Overall transfer level: Needs assistance Equipment used: Straight cane Transfers: Sit to/from Stand, Bed to chair/wheelchair/BSC Sit to Stand: Min assist   Step pivot transfers: Min assist       General transfer comment: unsteady labored movement with diffiuclty completing sit to stands due to BLE weakness    Ambulation/Gait Ambulation/Gait assistance: Min assist Gait Distance (Feet): 15 Feet Assistive device: Straight cane Gait Pattern/deviations: Decreased step length - right, Decreased step length - left, Decreased stride length, Decreased dorsiflexion - right Gait velocity: decreased     General Gait Details: slow labored cadence requiring frequent hand held assist while using SPC, no loss of balance  Stairs            Wheelchair Mobility    Modified Rankin (Stroke Patients Only)       Balance Overall balance assessment: Needs assistance Sitting-balance support: Feet supported, No upper extremity supported Sitting balance-Leahy Scale: Fair Sitting balance - Comments: fair/good seated at EOB   Standing balance support: During functional activity, Single extremity supported Standing balance-Leahy Scale: Poor  Standing balance comment: fair/poor using SPC                             Pertinent Vitals/Pain Pain  Assessment Pain Assessment: No/denies pain    Home Living Family/patient expects to be discharged to:: Private residence Living Arrangements: Spouse/significant other Available Help at Discharge: Family;Available PRN/intermittently Type of Home: Mobile home Home Access: Ramped entrance       Home Layout: One level Home Equipment: Conservation officer, nature (2 wheels);Cane - quad;Wheelchair - manual;BSC/3in1;Shower seat;Grab bars - tub/shower      Prior Function Prior Level of Function : Needs assist       Physical Assist : Mobility (physical) Mobility (physical): Bed mobility;Transfers;Gait;Stairs   Mobility Comments: Supervised household ambulator using quad-cane ADLs Comments: assisted by family     Hand Dominance   Dominant Hand: Left    Extremity/Trunk Assessment   Upper Extremity Assessment Upper Extremity Assessment: Generalized weakness;RUE deficits/detail;LUE deficits/detail RUE Deficits / Details: non-functional, contractures RUE: Unable to fully assess due to immobilization RUE Sensation: decreased proprioception RUE Coordination: decreased fine motor;decreased gross motor LUE Deficits / Details: grossly 4/5 LUE Sensation: WNL LUE Coordination: WNL    Lower Extremity Assessment Lower Extremity Assessment: Generalized weakness;RLE deficits/detail;LLE deficits/detail RLE Deficits / Details: grossly -4/5 except ankle dorsiflexion 2+/5 RLE Sensation: decreased proprioception;decreased light touch RLE Coordination: decreased fine motor;decreased gross motor LLE Deficits / Details: grossly 4+/5 LLE Sensation: WNL LLE Coordination: WNL    Cervical / Trunk Assessment Cervical / Trunk Assessment: Normal  Communication   Communication: No difficulties  Cognition Arousal/Alertness: Awake/alert Behavior During Therapy: WFL for tasks assessed/performed Overall Cognitive Status: Within Functional Limits for tasks assessed                                           General Comments      Exercises     Assessment/Plan    PT Assessment Patient needs continued PT services  PT Problem List Decreased strength;Decreased activity tolerance;Decreased balance;Decreased mobility       PT Treatment Interventions DME instruction;Gait training;Stair training;Functional mobility training;Therapeutic activities;Therapeutic exercise;Patient/family education;Balance training    PT Goals (Current goals can be found in the Care Plan section)  Acute Rehab PT Goals Patient Stated Goal: return home with boyfriend to assist PT Goal Formulation: With patient Time For Goal Achievement: 01/10/23 Potential to Achieve Goals: Good    Frequency Min 3X/week     Co-evaluation               AM-PAC PT "6 Clicks" Mobility  Outcome Measure Help needed turning from your back to your side while in a flat bed without using bedrails?: A Little Help needed moving from lying on your back to sitting on the side of a flat bed without using bedrails?: A Little Help needed moving to and from a bed to a chair (including a wheelchair)?: A Little Help needed standing up from a chair using your arms (e.g., wheelchair or bedside chair)?: A Lot Help needed to walk in hospital room?: A Little Help needed climbing 3-5 steps with a railing? : A Lot 6 Click Score: 16    End of Session   Activity Tolerance: Patient tolerated treatment well;Patient limited by fatigue Patient left: in chair;with call bell/phone within reach;with chair alarm set Nurse Communication: Mobility status PT Visit Diagnosis: Unsteadiness on  feet (R26.81);Other abnormalities of gait and mobility (R26.89);Muscle weakness (generalized) (M62.81)    Time: [id number] PT Time Calculation (min) (ACUTE ONLY): 32 min   Charges:   PT Evaluation $PT Eval Moderate Complexity: 1 Mod PT Treatments $Therapeutic Activity: 23-37 mins        3:19 PM, 01/06/23 Lonell Grandchild, MPT Physical Therapist with  [facility] 336 [phone] office [phone] mobile phone

## 2023-01-06 NOTE — Progress Notes (Signed)
Still sitting up in chair.  Walked with stedy assist to bathroom and voided

## 2023-01-06 NOTE — Progress Notes (Signed)
PROGRESS NOTE    [name]  [id number] DOB: 03-07-1956 DOA: 01/05/2023 PCP: Carrolyn Meiers, MD    Brief Narrative:  [name] is a 67 y.o. female with medical history significant of stroke at the age of 89 (required craniotomy), GERD, PUD, longstanding history of seizures who presents to the emergency department due to abdominal pain, nausea, vomiting which started last night.  Patient endorsed 3 episodes of dark red stools and blood in vomitus.  Abdominal pain is in lower quadrants, pain was intermittent and was rated as 8/10 on pain scale.  Last alcohol drink (beer) was about 24 hours ago.  She endorsed to about 6 beers daily and denies history of alcohol withdrawal.    Assessment and Plan:  Acute kidney injury/Dehydration BUN 6, creatinine 1.18 (baseline creatinine at 0.5-0.6) Continue IV hydration Renally adjust medications, avoid nephrotoxic agents/dehydration/hypotension   Lactic acidosis possibly due to multifactorial including AKI and dehydration Lactic acid x 2 was flat at 3.7 Continue IV hydration and continue to trend lactic acid   Presumed UTI POA Patient was empirically started on IV ceftriaxone, we shall continue same at this time with plan to de-escalate/discontinue based on urine culture   Questionable GI bleed H/H11.0/33.0, this was 11.2/33.8 on 10/08/2022, FOBT was negative Continue to monitor H/H and GI bleed.   Hypokalemia possibly due to vomiting K+ 2.6, this will be replenished   Hypomagnesemia -replete   Hyponatremia  -improved with IV hydration   Hypoalbuminemia possibly secondary to mild protein calorie malnutrition Albumin 3.3, protein supplement to be provided   Alcohol abuse with cirrhosis Patient endorsed history of alcohol abuse, AST 77, ALT 25 (> 2:1) She denies any history of alcohol withdrawal Continue to monitor outpatient for withdrawal symptoms and consider CIWA protocol for positive findings -encourage  cessation   History of stroke Patient had stroke at age 46 that required craniotomy  History of seizures Patient has had no recent seizures Continue home phenobarbital and phenytoin   Essential hypertension Continue lisinopril   GERD Continue Protonix  DVT prophylaxis: SCDs Start: 01/06/23 [id number]    Code Status: Full Code    Disposition Plan:  Level of care: Telemetry Status is: Observation The patient will require care spanning > 2 midnights and should be moved to inpatient     Consultants:  none   Subjective: Feeling better today than yesterday  Objective: Vitals:   01/06/23 0430 01/06/23 0500 01/06/23 0600 01/06/23 0827  BP:  132/61 (!) 119/53 (!) 137/59  Pulse: (!) 116 (!) 113 (!) 123 (!) 110  Resp: 16 16 16 16   Temp:    98.7 F (37.1 C)  TempSrc:    Oral  SpO2: 100% 99% 98% 100%  Weight:      Height:        Intake/Output Summary (Last 24 hours) at 01/06/2023 [id number] Last data filed at 01/06/2023 0215 Gross per 24 hour  Intake 2270.5 ml  Output --  Net 2270.5 ml   Filed Weights   01/05/23 1819  Weight: 63.5 kg    Examination:   General: Appearance:    Chronically ill appearing female in no acute distress     Lungs:      respirations unlabored  Heart:    Tachycardic.    MS:   All extremities are intact.    Neurologic:   Awake, alert       Data Reviewed: I have personally reviewed following labs and imaging studies  CBC: Recent Labs  Lab  01/05/23 1838 01/05/23 1847 01/06/23 0907  WBC 4.2  --  5.8  NEUTROABS 3.0  --   --   HGB 11.0* 12.6 10.6*  HCT 33.0* 37.0 31.3*  MCV 81.3  --  81.9  PLT 154  --  [id number]*   Basic Metabolic Panel: Recent Labs  Lab 01/05/23 1838 01/05/23 1847 01/06/23 0907  NA 130* 135 136  K 2.6* 2.6* 3.8  CL 92* 92* 101  CO2 24  --  24  GLUCOSE 135* 132* 115*  BUN 6* 4* 5*  CREATININE 1.18* 1.10* 0.83  CALCIUM 8.3*  --  8.5*  MG 1.5*  --   --    GFR: Estimated Creatinine Clearance: 60.6 mL/min (by C-G  formula based on SCr of 0.83 mg/dL). Liver Function Tests: Recent Labs  Lab 01/05/23 1838  AST 77*  ALT 25  ALKPHOS 120  BILITOT 1.2  PROT 7.3  ALBUMIN 3.3*   Recent Labs  Lab 01/05/23 1838  LIPASE 26   No results for input(s): "AMMONIA" in the last 168 hours. Coagulation Profile: Recent Labs  Lab 01/05/23 1838  INR 1.2   Cardiac Enzymes: No results for input(s): "CKTOTAL", "CKMB", "CKMBINDEX", "TROPONINI" in the last 168 hours. BNP (last 3 results) No results for input(s): "PROBNP" in the last 8760 hours. HbA1C: No results for input(s): "HGBA1C" in the last 72 hours. CBG: No results for input(s): "GLUCAP" in the last 168 hours. Lipid Profile: No results for input(s): "CHOL", "HDL", "LDLCALC", "TRIG", "CHOLHDL", "LDLDIRECT" in the last 72 hours. Thyroid Function Tests: No results for input(s): "TSH", "T4TOTAL", "FREET4", "T3FREE", "THYROIDAB" in the last 72 hours. Anemia Panel: No results for input(s): "VITAMINB12", "FOLATE", "FERRITIN", "TIBC", "IRON", "RETICCTPCT" in the last 72 hours. Sepsis Labs: Recent Labs  Lab 01/05/23 1838 01/05/23 2022  LATICACIDVEN 3.7* 3.7*    No results found for this or any previous visit (from the past 240 hour(s)).       Radiology Studies: CT ANGIO ABDOMEN PELVIS  W &/OR WO CONTRAST  Result Date: 01/05/2023 CLINICAL DATA:  67 year old with lower GI bleed. EXAM: CTA ABDOMEN AND PELVIS WITHOUT AND WITH CONTRAST TECHNIQUE: Multidetector CT imaging of the abdomen and pelvis was performed using the standard protocol during bolus administration of intravenous contrast. Multiplanar reconstructed images and MIPs were obtained and reviewed to evaluate the vascular anatomy. RADIATION DOSE REDUCTION: This exam was performed according to the departmental dose-optimization program which includes automated exposure control, adjustment of the mA and/or kV according to patient size and/or use of iterative reconstruction technique. CONTRAST:  33mL  OMNIPAQUE IOHEXOL 350 MG/ML SOLN COMPARISON:  CT abdomen and pelvis 08/04/2022 FINDINGS: VASCULAR Aorta: Normal caliber aorta without aneurysm, dissection, vasculitis or significant stenosis. Celiac: Patent without evidence of aneurysm, dissection, vasculitis or significant stenosis. SMA: Patent without evidence of aneurysm, dissection, vasculitis or significant stenosis. Renals: Bilateral renal arteries are patent without aneurysm, dissection or significant stenosis. Single right renal artery. Two left renal arteries with a small accessory left renal artery. IMA: Patent Inflow: Patent without evidence of aneurysm, dissection, vasculitis or significant stenosis. Proximal Outflow: Proximal femoral arteries are patent bilaterally. Veins: Iliac veins, IVC and bilateral renal veins are patent. Portal venous system is patent. Review of the MIP images confirms the above findings. NON-VASCULAR Lower chest: Lung bases are clear. Hepatobiliary: Liver has a mildly nodular contour and suggestive for underlying cirrhosis. No discrete liver lesion. No biliary dilatation. Normal appearance of the gallbladder. Pancreas: Unremarkable. No pancreatic ductal dilatation or surrounding inflammatory changes. Spleen:  Normal in size without focal abnormality. Adrenals/Urinary Tract: Normal adrenal glands. Negative for kidney stones. No hydronephrosis. No suspicious renal lesions. Small amount of fluid in the urinary bladder. Mild bladder wall thickening is nonspecific and could be related to under distension. Stomach/Bowel: Multiple colonic diverticula. These colonic diverticula contain high density material and makes it difficult to evaluate for contrast extravasation. No evidence for acute GI bleeding. Normal appearance of the appendix. Normal appearance of the small bowel without dilatation. Wall thickening in the proximal stomach is likely secondary to under distension. No focal bowel inflammation. Lymphatic: No lymph node enlargement  in the abdomen or pelvis. Reproductive: Status post hysterectomy. No adnexal masses. Other: Negative for free fluid.  Negative for free air. Musculoskeletal: Degenerative changes involving the left hip. No acute bone abnormality. Degenerative facet disease in lumbar spine. Mild anterolisthesis of L4 on L5. IMPRESSION: VASCULAR 1. No evidence for acute GI bleeding. 2. Main visceral arteries are patent without significant stenosis. NON-VASCULAR 1. No acute abnormality in the abdomen or pelvis. 2. Colonic diverticulosis without acute inflammation and no evidence for active bleeding. 3. Mild bladder wall thickening is nonspecific and was seen on previous exam from 05/07/2022. This may represent a chronic finding but recommend correlation with urinalysis. 4. Liver has a nodular contour and suggestive for cirrhosis. Electronically Signed   By: Markus Daft M.D.   On: 01/05/2023 19:51        Scheduled Meds:  feeding supplement  237 mL Oral BID BM   pantoprazole  40 mg Intravenous Q12H   potassium chloride  40 mEq Oral Once   Continuous Infusions:  sodium chloride Stopped (01/06/23 0215)   sodium chloride     0.9 % NaCl with KCl 20 mEq / L     cefTRIAXone (ROCEPHIN)  IV     magnesium sulfate bolus IVPB       LOS: 0 days    Time spent: 45 minutes spent on chart review, discussion with nursing staff, consultants, updating family and interview/physical exam; more than 50% of that time was spent in counseling and/or coordination of care.    Geradine Girt, DO [facility] Available via Epic secure chat 7am-7pm After these hours, please refer to coverage provider listed on [email or web address] 01/06/2023, 9:37 AM

## 2023-01-06 NOTE — Progress Notes (Signed)
Alert and oriented x 3, does not know day.  Has had no bms since being admitted to floor and no vomiting, stating wants food.  When talking and interacting, demonstrates some mental deficiencies in not really being able to answer some questions in a way that makes sense.  Sister called and said that patient lives with her boyfriend and has a home health aid that helps in the home and takes patient to appointments, etc. States that otherwise, patient just sits all day. Sister does not know how much alcohol she consumes but patient admitted to 6 beers daily.  Assisted to bedside commode twice today and voided dark urine each time.

## 2023-01-06 NOTE — Progress Notes (Signed)
   01/06/23 1850  Assess: MEWS Score  Temp 98.6 F (37 C)  BP (!) 91/46  MAP (mmHg) (!) 61  Pulse Rate (!) 110  Resp 16  SpO2 99 %  O2 Device Room Air  Assess: MEWS Score  MEWS Temp 0  MEWS Systolic 1  MEWS Pulse 1  MEWS RR 0  MEWS LOC 0  MEWS Score 2  MEWS Score Color Yellow  Assess: if the MEWS score is Yellow or Red  Were vital signs taken at a resting state? Yes  Focused Assessment No change from prior assessment  Does the patient meet 2 or more of the SIRS criteria? Yes  Does the patient have a confirmed or suspected source of infection? Yes  Provider and Rapid Response Notified? No  MEWS guidelines implemented  No, previously yellow, continue vital signs every 4 hours  Assess: SIRS CRITERIA  SIRS Temperature  0  SIRS Pulse 1  SIRS Respirations  0  SIRS WBC 0  SIRS Score Sum  1

## 2023-01-06 NOTE — Progress Notes (Signed)
   01/06/23 1441  Vitals  Temp 99.2 F (37.3 C)  Temp Source Oral  BP (!) 87/48  MAP (mmHg) (!) 61  BP Method Automatic  Pulse Rate (!) 107  Pulse Rate Source Monitor  MEWS COLOR  MEWS Score Color Yellow  Oxygen Therapy  SpO2 100 %  MEWS Score  MEWS Temp 0  MEWS Systolic 1  MEWS Pulse 1  MEWS RR 0  MEWS LOC 0  MEWS Score 2

## 2023-01-07 ENCOUNTER — Encounter (HOSPITAL_COMMUNITY): Payer: Self-pay | Admitting: Internal Medicine

## 2023-01-07 DIAGNOSIS — N179 Acute kidney failure, unspecified: Secondary | ICD-10-CM | POA: Diagnosis not present

## 2023-01-07 LAB — COMPREHENSIVE METABOLIC PANEL
ALT: 19 U/L (ref 0–44)
AST: 47 U/L — ABNORMAL HIGH (ref 15–41)
Albumin: 2.4 g/dL — ABNORMAL LOW (ref 3.5–5.0)
Alkaline Phosphatase: 88 U/L (ref 38–126)
Anion gap: 6 (ref 5–15)
BUN: 11 mg/dL (ref 8–23)
CO2: 22 mmol/L (ref 22–32)
Calcium: 8.1 mg/dL — ABNORMAL LOW (ref 8.9–10.3)
Chloride: 110 mmol/L (ref 98–111)
Creatinine, Ser: 0.64 mg/dL (ref 0.44–1.00)
GFR, Estimated: >60 mL/min (ref >60)
Glucose, Bld: 102 mg/dL — ABNORMAL HIGH (ref 70–99)
Potassium: 4.3 mmol/L (ref 3.5–5.1)
Sodium: 138 mmol/L (ref 135–145)
Total Bilirubin: 0.8 mg/dL (ref 0.3–1.2)
Total Protein: 5.3 g/dL — ABNORMAL LOW (ref 6.5–8.1)

## 2023-01-07 LAB — MAGNESIUM: Magnesium: 1.9 mg/dL (ref 1.7–2.4)

## 2023-01-07 LAB — CBC
HCT: 26.5 % — ABNORMAL LOW (ref 36.0–46.0)
Hemoglobin: 8.7 g/dL — ABNORMAL LOW (ref 12.0–15.0)
MCH: 27.8 pg (ref 26.0–34.0)
MCHC: 32.8 g/dL (ref 30.0–36.0)
MCV: 84.7 fL (ref 80.0–100.0)
Platelets: 107 10*3/uL — ABNORMAL LOW (ref 150–400)
RBC: 3.13 MIL/uL — ABNORMAL LOW (ref 3.87–5.11)
RDW: 16.3 % — ABNORMAL HIGH (ref 11.5–15.5)
WBC: 3.7 10*3/uL — ABNORMAL LOW (ref 4.0–10.5)
nRBC: 0 % (ref 0.0–0.2)

## 2023-01-07 LAB — PHOSPHORUS: Phosphorus: 2.8 mg/dL (ref 2.5–4.6)

## 2023-01-07 MED ORDER — LISINOPRIL 10 MG PO TABS
20.0000 mg | ORAL_TABLET | Freq: Every day | ORAL | Status: DC
  Administered 2023-01-07 – 2023-01-08 (×2): 20 mg via ORAL
  Filled 2023-01-07 (×2): qty 2

## 2023-01-07 NOTE — Progress Notes (Signed)
Physical Therapy Treatment Patient Details Name: [name] MRN: [id number] DOB: 1956/02/26 Today's Date: 01/07/2023   History of Present Illness [name] is a 67 y.o. female with medical history significant of stroke at the age of 58 (required craniotomy), GERD, PUD, longstanding history of seizures who presents to the emergency department due to abdominal pain, nausea, vomiting which started last night.  Patient endorsed 3 episodes of dark red stools and blood in vomitus.  Abdominal pain is in lower quadrants, pain was intermittent and was rated as 8/10 on pain scale.  Last alcohol drink (beer) was about 24 hours ago.  She endorsed to about 6 beers daily and denies history of alcohol withdrawal.    PT Comments    Patient presents in chair (assisted by nursing staff) and agreeable for therapy.  Patient demonstrates labored movement for standing up from chair and commode in bathroom requiring Min/Min assist, increased endurance/distance for gait training using SPC without loss of balance and limited mostly due to fatigue.  Patient tolerated staying up in chair after therapy - nurse notified.  Patient will benefit from continued skilled physical therapy in hospital and recommended venue below to increase strength, balance, endurance for safe ADLs and gait.     Recommendations for follow up therapy are one component of a multi-disciplinary discharge planning process, led by the attending physician.  Recommendations may be updated based on patient status, additional functional criteria and insurance authorization.  Follow Up Recommendations  Home health PT     Assistance Recommended at Discharge Set up Supervision/Assistance  Patient can return home with the following A little help with walking and/or transfers;Help with stairs or ramp for entrance;A little help with bathing/dressing/bathroom;Assistance with cooking/housework   Equipment Recommendations  None recommended by PT     Recommendations for Other Services       Precautions / Restrictions Precautions Precautions: Fall Restrictions Weight Bearing Restrictions: No     Mobility  Bed Mobility               General bed mobility comments: Presents seated in chair (assisted by nursing staff)    Transfers Overall transfer level: Needs assistance Equipment used: Straight cane Transfers: Sit to/from Stand, Bed to chair/wheelchair/BSC Sit to Stand: Min guard, Min assist   Step pivot transfers: Min guard, Min assist       General transfer comment: labored movement requiring boost for sitting up from chair and commode    Ambulation/Gait Ambulation/Gait assistance: Supervision Gait Distance (Feet): 65 Feet Assistive device: Straight cane Gait Pattern/deviations: Decreased step length - right, Decreased step length - left, Decreased stride length, Decreased dorsiflexion - right Gait velocity: decreased     General Gait Details: increased endurance/distance for gait training without loss of balance, limited mostly due to fatigue   Stairs             Wheelchair Mobility    Modified Rankin (Stroke Patients Only)       Balance Overall balance assessment: Needs assistance Sitting-balance support: Feet supported, No upper extremity supported Sitting balance-Leahy Scale: Fair Sitting balance - Comments: fair/good seated at edge of chair   Standing balance support: During functional activity, Single extremity supported Standing balance-Leahy Scale: Fair Standing balance comment: fair/good using SPC                            Cognition Arousal/Alertness: Awake/alert Behavior During Therapy: WFL for tasks assessed/performed Overall Cognitive Status: Within Functional  Limits for tasks assessed                                          Exercises      General Comments        Pertinent Vitals/Pain Pain Assessment Pain Assessment: No/denies pain     Home Living                          Prior Function            PT Goals (current goals can now be found in the care plan section) Acute Rehab PT Goals Patient Stated Goal: return home with boyfriend to assist PT Goal Formulation: With patient Time For Goal Achievement: 01/10/23 Potential to Achieve Goals: Good Progress towards PT goals: Progressing toward goals    Frequency    Min 3X/week      PT Plan Current plan remains appropriate    Co-evaluation              AM-PAC PT "6 Clicks" Mobility   Outcome Measure  Help needed turning from your back to your side while in a flat bed without using bedrails?: A Little Help needed moving from lying on your back to sitting on the side of a flat bed without using bedrails?: A Little Help needed moving to and from a bed to a chair (including a wheelchair)?: A Little Help needed standing up from a chair using your arms (e.g., wheelchair or bedside chair)?: A Little Help needed to walk in hospital room?: A Little Help needed climbing 3-5 steps with a railing? : A Lot 6 Click Score: 17    End of Session   Activity Tolerance: Patient tolerated treatment well;Patient limited by fatigue Patient left: in chair;with call bell/phone within reach Nurse Communication: Mobility status PT Visit Diagnosis: Unsteadiness on feet (R26.81);Other abnormalities of gait and mobility (R26.89);Muscle weakness (generalized) (M62.81)     Time: [id number] PT Time Calculation (min) (ACUTE ONLY): 24 min  Charges:  $Gait Training: 8-22 mins $Therapeutic Activity: 8-22 mins                     3:49 PM, 01/07/23 Lonell Grandchild, MPT Physical Therapist with [facility] [phone] office [phone] mobile phone

## 2023-01-07 NOTE — Progress Notes (Signed)
PROGRESS NOTE    [name]  [id number] DOB: January 25, 1956 DOA: 01/05/2023 PCP: Carrolyn Meiers, MD    Brief Narrative:  [name] is a 67 y.o. female with medical history significant of stroke at the age of 12 (required craniotomy), GERD, PUD, longstanding history of seizures who presents to the emergency department due to abdominal pain, nausea, vomiting which started last night.  Patient endorsed 3 episodes of dark red stools and blood in vomitus.  Abdominal pain is in lower quadrants, pain was intermittent and was rated as 8/10 on pain scale.  Last alcohol drink (beer) was about 24 hours ago.  She endorsed to about 6 beers daily and denies history of alcohol withdrawal.    Assessment and Plan:  Acute kidney injury/Dehydration BUN 6, creatinine 1.18 (baseline creatinine at 0.5-0.6) -resolved -avoid nephrotoxic agents/dehydration/hypotension   Lactic acidosis possibly due to multifactorial including AKI and dehydration Lactic acid trending down   Presumed UTI POA Patient was empirically started on IV ceftriaxone, we shall continue same at this time with plan to de-escalate/discontinue based on urine culture   Questionable GI bleed H/H11.0/33.0, this was 11.2/33.8 on 10/08/2022, FOBT was negative H/h baseline is 9-10 -patient has had brown stools so doubt current bleeding   Hypokalemia possibly due to vomiting -replete   Hypomagnesemia -replete   Hyponatremia  -improved with IV hydration   Hypoalbuminemia possibly secondary to mild protein calorie malnutrition Albumin 3.3, protein supplement to be provided   Alcohol abuse with cirrhosis Patient endorsed history of alcohol abuse, AST 77, ALT 25 (> 2:1) She denies any history of alcohol withdrawal Continue to monitor outpatient for withdrawal symptoms and consider CIWA protocol for positive findings -encourage cessation   History of stroke Patient had stroke at age 43 that required  craniotomy  History of seizures Patient has had no recent seizures Continue home phenobarbital and phenytoin   Essential hypertension Continue lisinopril   GERD Continue Protonix  DVT prophylaxis: SCDs Start: 01/06/23 0923    Code Status: Full Code    Disposition Plan:  Level of care: Telemetry Status is: inpt    Consultants:  none   Subjective: No current complaints-- brown stools, no blood  Objective: Vitals:   01/06/23 2047 01/06/23 2356 01/07/23 0434 01/07/23 0813  BP: 127/65 123/65 96/61 116/63  Pulse: (!) 115 (!) 107 95 (!) 106  Resp: 20 17 18 18   Temp: 98.7 F (37.1 C) 99.3 F (37.4 C) 98.5 F (36.9 C) 98.8 F (37.1 C)  TempSrc: Oral Oral  Oral  SpO2: 100% 100% 100% 99%  Weight:      Height:        Intake/Output Summary (Last 24 hours) at 01/07/2023 1014 Last data filed at 01/07/2023 0100 Gross per 24 hour  Intake 1995.83 ml  Output --  Net 1995.83 ml   Filed Weights   01/05/23 1819  Weight: 63.5 kg    Examination:   General: Appearance:    Chronically ill appearing female in no acute distress     Lungs:      respirations unlabored  Heart:    Tachycardic.    MS:   All extremities are intact.    Neurologic:   Awake, alert       Data Reviewed: I have personally reviewed following labs and imaging studies  CBC: Recent Labs  Lab 01/05/23 1838 01/05/23 1847 01/06/23 0907 01/07/23 0451  WBC 4.2  --  5.8 3.7*  NEUTROABS 3.0  --   --   --  HGB 11.0* 12.6 10.6* 8.7*  HCT 33.0* 37.0 31.3* 26.5*  MCV 81.3  --  81.9 84.7  PLT 154  --  131* [id number]*   Basic Metabolic Panel: Recent Labs  Lab 01/05/23 1838 01/05/23 1847 01/06/23 0907 01/07/23 0451  NA 130* 135 136 138  K 2.6* 2.6* 3.8 4.3  CL 92* 92* 101 110  CO2 24  --  24 22  GLUCOSE 135* 132* 115* 102*  BUN 6* 4* 5* 11  CREATININE 1.18* 1.10* 0.83 0.64  CALCIUM 8.3*  --  8.5* 8.1*  MG 1.5*  --   --  1.9  PHOS  --   --   --  2.8   GFR: Estimated Creatinine Clearance:  62.9 mL/min (by C-G formula based on SCr of 0.64 mg/dL). Liver Function Tests: Recent Labs  Lab 01/05/23 1838 01/07/23 0451  AST 77* 47*  ALT 25 19  ALKPHOS 120 88  BILITOT 1.2 0.8  PROT 7.3 5.3*  ALBUMIN 3.3* 2.4*   Recent Labs  Lab 01/05/23 1838  LIPASE 26   No results for input(s): "AMMONIA" in the last 168 hours. Coagulation Profile: Recent Labs  Lab 01/05/23 1838  INR 1.2   Cardiac Enzymes: No results for input(s): "CKTOTAL", "CKMB", "CKMBINDEX", "TROPONINI" in the last 168 hours. BNP (last 3 results) No results for input(s): "PROBNP" in the last 8760 hours. HbA1C: No results for input(s): "HGBA1C" in the last 72 hours. CBG: No results for input(s): "GLUCAP" in the last 168 hours. Lipid Profile: No results for input(s): "CHOL", "HDL", "LDLCALC", "TRIG", "CHOLHDL", "LDLDIRECT" in the last 72 hours. Thyroid Function Tests: No results for input(s): "TSH", "T4TOTAL", "FREET4", "T3FREE", "THYROIDAB" in the last 72 hours. Anemia Panel: No results for input(s): "VITAMINB12", "FOLATE", "FERRITIN", "TIBC", "IRON", "RETICCTPCT" in the last 72 hours. Sepsis Labs: Recent Labs  Lab 01/05/23 1838 01/05/23 2022 01/06/23 0907  LATICACIDVEN 3.7* 3.7* 2.1*    No results found for this or any previous visit (from the past 240 hour(s)).       Radiology Studies: CT ANGIO ABDOMEN PELVIS  W &/OR WO CONTRAST  Result Date: 01/05/2023 CLINICAL DATA:  67 year old with lower GI bleed. EXAM: CTA ABDOMEN AND PELVIS WITHOUT AND WITH CONTRAST TECHNIQUE: Multidetector CT imaging of the abdomen and pelvis was performed using the standard protocol during bolus administration of intravenous contrast. Multiplanar reconstructed images and MIPs were obtained and reviewed to evaluate the vascular anatomy. RADIATION DOSE REDUCTION: This exam was performed according to the departmental dose-optimization program which includes automated exposure control, adjustment of the mA and/or kV according to  patient size and/or use of iterative reconstruction technique. CONTRAST:  88mL OMNIPAQUE IOHEXOL 350 MG/ML SOLN COMPARISON:  CT abdomen and pelvis 08/04/2022 FINDINGS: VASCULAR Aorta: Normal caliber aorta without aneurysm, dissection, vasculitis or significant stenosis. Celiac: Patent without evidence of aneurysm, dissection, vasculitis or significant stenosis. SMA: Patent without evidence of aneurysm, dissection, vasculitis or significant stenosis. Renals: Bilateral renal arteries are patent without aneurysm, dissection or significant stenosis. Single right renal artery. Two left renal arteries with a small accessory left renal artery. IMA: Patent Inflow: Patent without evidence of aneurysm, dissection, vasculitis or significant stenosis. Proximal Outflow: Proximal femoral arteries are patent bilaterally. Veins: Iliac veins, IVC and bilateral renal veins are patent. Portal venous system is patent. Review of the MIP images confirms the above findings. NON-VASCULAR Lower chest: Lung bases are clear. Hepatobiliary: Liver has a mildly nodular contour and suggestive for underlying cirrhosis. No discrete liver lesion. No biliary dilatation. Normal  appearance of the gallbladder. Pancreas: Unremarkable. No pancreatic ductal dilatation or surrounding inflammatory changes. Spleen: Normal in size without focal abnormality. Adrenals/Urinary Tract: Normal adrenal glands. Negative for kidney stones. No hydronephrosis. No suspicious renal lesions. Small amount of fluid in the urinary bladder. Mild bladder wall thickening is nonspecific and could be related to under distension. Stomach/Bowel: Multiple colonic diverticula. These colonic diverticula contain high density material and makes it difficult to evaluate for contrast extravasation. No evidence for acute GI bleeding. Normal appearance of the appendix. Normal appearance of the small bowel without dilatation. Wall thickening in the proximal stomach is likely secondary to under  distension. No focal bowel inflammation. Lymphatic: No lymph node enlargement in the abdomen or pelvis. Reproductive: Status post hysterectomy. No adnexal masses. Other: Negative for free fluid.  Negative for free air. Musculoskeletal: Degenerative changes involving the left hip. No acute bone abnormality. Degenerative facet disease in lumbar spine. Mild anterolisthesis of L4 on L5. IMPRESSION: VASCULAR 1. No evidence for acute GI bleeding. 2. Main visceral arteries are patent without significant stenosis. NON-VASCULAR 1. No acute abnormality in the abdomen or pelvis. 2. Colonic diverticulosis without acute inflammation and no evidence for active bleeding. 3. Mild bladder wall thickening is nonspecific and was seen on previous exam from 05/07/2022. This may represent a chronic finding but recommend correlation with urinalysis. 4. Liver has a nodular contour and suggestive for cirrhosis. Electronically Signed   By: Markus Daft M.D.   On: 01/05/2023 19:51        Scheduled Meds:  feeding supplement  237 mL Oral BID BM   lisinopril  20 mg Oral Daily   pantoprazole  40 mg Intravenous Q12H   PHENobarbital  32.4 mg Oral BID   phenytoin  200 mg Oral BID   Continuous Infusions:  sodium chloride Stopped (01/06/23 0215)   cefTRIAXone (ROCEPHIN)  IV Stopped (01/06/23 1341)     LOS: 1 day    Time spent: 45 minutes spent on chart review, discussion with nursing staff, consultants, updating family and interview/physical exam; more than 50% of that time was spent in counseling and/or coordination of care.    Geradine Girt, DO [facility] Available via Epic secure chat 7am-7pm After these hours, please refer to coverage provider listed on [email or web address] 01/07/2023, 10:14 AM

## 2023-01-07 NOTE — TOC Initial Note (Signed)
Transition of Care [facility]) - Initial/Assessment Note    Patient Details  Name: [name] MRN: [id number] Date of Birth: 10-Mar-1956  [facility] of [facility]) CM/SW Contact:    Shade Flood, LCSW Phone Number: 01/07/2023, 1:04 PM  Clinical Narrative:                  Pt admitted from home. She resides with her sig other and has a PCS aide 5 days a week. PT recommending HH at dc.  Spoke with pt's sister/POA for dc planning. Sister states that she makes pt's medical decisions. Sister agreeable to [facility] PT and SW as recommended by this LCSW. Reviewed [facility] CMS providers and arranged as requested. [facility] staff will contact sister to arrange visits.  SA treatment resource information added to pt's AVS.   MD anticipating dc over the weekend. TOC will follow.   Expected Discharge Plan: [place] Barriers to Discharge: Continued Medical Work up   Patient Goals and CMS Choice Patient states their goals for this hospitalization and ongoing recovery are:: go home CMS Medicare.gov Compare Post Acute Care list provided to:: Patient Represenative (must comment) Choice offered to / list presented to : [place] / Guardian      Expected Discharge Plan and Services In-house Referral: Clinical Social Work   Post Acute Care Choice: [place] arrangements for the past 2 months: [place]: PT, Social Work [employer] Agency: [place] Date [place]: 01/07/23   Representative spoke with at [place]: Tommi Rumps  Prior Living Arrangements/Services Living arrangements for the past 2 months: [place] with:: Significant Other Patient language and need for interpreter reviewed:: Yes Do you feel safe going back to the place where you live?: Yes      Need for Family Participation in Patient Care: Yes (Comment) Care giver support system in place?: Yes (comment) Current home services:  DME Criminal Activity/Legal Involvement Pertinent to Current Situation/Hospitalization: No - Comment as needed  Activities of Daily Living Home Assistive Devices/Equipment: Environmental consultant (specify type), Wheelchair ADL Screening (condition at time of admission) Patient's cognitive ability adequate to safely complete daily activities?: No Is the patient deaf or have difficulty hearing?: No Does the patient have difficulty seeing, even when wearing glasses/contacts?: No Does the patient have difficulty concentrating, remembering, or making decisions?: No Patient able to express need for assistance with ADLs?: Yes Does the patient have difficulty dressing or bathing?: Yes Independently performs ADLs?: No Communication: Independent Dressing (OT): Needs assistance Is this a change from baseline?: Pre-admission baseline Grooming: Needs assistance Is this a change from baseline?: Pre-admission baseline Feeding: Independent Bathing: Needs assistance Is this a change from baseline?: Pre-admission baseline Toileting: Needs assistance Is this a change from baseline?: Pre-admission baseline In/Out Bed: Needs assistance Is this a change from baseline?: Pre-admission baseline Walks in Home: Independent with device (comment) Does the patient have difficulty walking or climbing stairs?: Yes Weakness of Legs: Both Weakness of Arms/Hands: Right  Permission Sought/Granted Permission sought to share information with : Facility Art therapist granted to share information with : Yes, Verbal Permission Granted     Permission granted to share info w AGENCY: HH        Emotional Assessment       Orientation: : Oriented to Self, Oriented to  Place, Oriented to Situation Alcohol / Substance Use: Alcohol Use Psych Involvement: No (comment)  Admission diagnosis:  Dehydration [E86.0] Hypokalemia [E87.6] Hypomagnesemia [E83.42] AKI (acute kidney injury) ([place]) [N17.9] Acute kidney injury  ([place]) [N17.9] Cystitis [N30.90] Patient Active Problem List   Diagnosis Date Noted   UTI (urinary tract infection) 01/06/2023   Hypoalbuminemia due to protein-calorie malnutrition ([place]) 01/06/2023   History of seizures 01/06/2023   Acute hyperkalemia 08/05/2022   Transaminitis 08/04/2022   Hypomagnesemia 11/20/2021   Subtherapeutic serum dilantin level    Seizure-like activity ([place]) 11/19/2021   Essential hypertension 06/16/2021   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[place]) 06/16/2021   Acute lower UTI 05/28/2021   Severe sepsis ([place]) 05/28/2021   Lactic acidosis 12/19/2020   Encephalopathy acute [id number]   Acute metabolic encephalopathy [id number]   Acute blood loss anemia 11/02/2019   Overweight (BMI 25.0-29.9) 11/02/2019   Gait disturbance    Rectal bleeding 10/08/2019   Hyponatremia    Diarrhea 04/11/2019   Nausea, vomiting, and diarrhea    ARF (acute renal failure) ([place]) 04/10/2019   Acute kidney injury ([place]) 01/22/2019   History of stroke 01/22/2019   Asthma 01/22/2019   Gastroenteritis 01/22/2019   Stroke ([place]) 06/14/2018   Cirrhosis of liver without ascites ([place]) 04/21/2017   AKI (acute kidney injury) ([place]) 07/30/2016   Anemia 11/25/2015   Abnormal liver function    Coffee ground emesis    Reflux esophagitis    Sinus tachycardia ([place]) 10/10/2014   Hypokalemia 10/10/2014   Seizure disorder ([place]) 10/10/2014   Alcohol abuse 09/05/2012   Odynophagia 09/05/2012   GERD (gastroesophageal reflux disease) 09/05/2012   Abdominal pain 09/05/2012   FX CLOSED TIBIA NOS 07/27/2007   STROKE-With also h/o Aneurysm clipping? 07/25/2007   Seizures ([place]) 07/25/2007   PCP:  Carrolyn Meiers, MD Pharmacy:   [facility], [address] [place] [address] Phone: [phone] Fax: [phone]     Social Determinants of Health (SDOH) Social History: [place]: No Food Insecurity (01/06/2023)  Housing: Low Risk   (01/06/2023)  Transportation Needs: No Transportation Needs (01/06/2023)  Utilities: Not At Risk (01/06/2023)  Tobacco Use: Medium Risk (01/07/2023)   SDOH Interventions:     Readmission Risk Interventions     No data to display

## 2023-01-08 DIAGNOSIS — N179 Acute kidney failure, unspecified: Secondary | ICD-10-CM | POA: Diagnosis not present

## 2023-01-08 LAB — CBC
HCT: 26.6 % — ABNORMAL LOW (ref 36.0–46.0)
Hemoglobin: 8.8 g/dL — ABNORMAL LOW (ref 12.0–15.0)
MCH: 28 pg (ref 26.0–34.0)
MCHC: 33.1 g/dL (ref 30.0–36.0)
MCV: 84.7 fL (ref 80.0–100.0)
Platelets: 102 10*3/uL — ABNORMAL LOW (ref 150–400)
RBC: 3.14 MIL/uL — ABNORMAL LOW (ref 3.87–5.11)
RDW: 16.5 % — ABNORMAL HIGH (ref 11.5–15.5)
WBC: 3.2 10*3/uL — ABNORMAL LOW (ref 4.0–10.5)
nRBC: 0 % (ref 0.0–0.2)

## 2023-01-08 LAB — URINE CULTURE

## 2023-01-08 LAB — BASIC METABOLIC PANEL
Anion gap: 6 (ref 5–15)
BUN: 12 mg/dL (ref 8–23)
CO2: 23 mmol/L (ref 22–32)
Calcium: 8.1 mg/dL — ABNORMAL LOW (ref 8.9–10.3)
Chloride: 107 mmol/L (ref 98–111)
Creatinine, Ser: 0.54 mg/dL (ref 0.44–1.00)
GFR, Estimated: >60 mL/min (ref >60)
Glucose, Bld: 93 mg/dL (ref 70–99)
Potassium: 4.1 mmol/L (ref 3.5–5.1)
Sodium: 136 mmol/L (ref 135–145)

## 2023-01-08 MED ORDER — LISINOPRIL 20 MG PO TABS: 20.0000 mg | ORAL_TABLET | Freq: Every day | ORAL | 0 refills | Status: AC

## 2023-01-08 NOTE — Discharge Summary (Signed)
Physician Discharge Summary  [name] [id number] DOB: 03/08/1956 DOA: 01/05/2023  PCP: Carrolyn Meiers, MD  Admit date: 01/05/2023 Discharge date: 01/08/2023  Admitted From: home Discharge disposition: home   Recommendations for Outpatient Follow-Up:   Increased home health Needs cessation of alcohol BMP, CBC 1 week   Discharge Diagnosis:   Principal Problem:   Acute kidney injury ([place]) Active Problems:   Alcohol abuse   Hypokalemia   AKI (acute kidney injury) ([place])   History of stroke   Hyponatremia   Lactic acidosis   Hypomagnesemia   UTI (urinary tract infection)   Hypoalbuminemia due to protein-calorie malnutrition ([place])   History of seizures    Discharge Condition: Improved.  Diet recommendation:  Regular.  Wound care: None.  Code status: Full.   History of Present Illness:   [name] is a 67 y.o. female with medical history significant of stroke at the age of 50 (required craniotomy), GERD, PUD, longstanding history of seizures who presents to the emergency department due to abdominal pain, nausea, vomiting which started last night.  Patient endorsed 3 episodes of dark red stools and blood in vomitus.  Abdominal pain is in lower quadrants, pain was intermittent and was rated as 8/10 on pain scale.  Last alcohol drink (beer) was about 24 hours ago.  She endorsed to about 6 beers daily and denies history of alcohol withdrawal.    Hospital Course by Problem:   Acute kidney injury/Dehydration BUN 6, creatinine 1.18 (baseline creatinine at 0.5-0.6) -resolved -avoid nephrotoxic agents/dehydration/hypotension   Lactic acidosis possibly due to multifactorial including AKI and dehydration Lactic acid trending down   Presumed UTI POA Patient was empirically started on IV ceftriaxone and treated-- culture with multiple species   Questionable GI bleed  FOBT was negative H/h baseline is 9-10 and currently 8 -patient has had  brown stools x3 so doubt current bleeding   Hypokalemia possibly due to vomiting -repleted   Hypomagnesemia -repleted   Hyponatremia  -improved with IV hydration   Hypoalbuminemia possibly secondary to mild protein calorie malnutrition Albumin 3.3, protein supplement to be provided   Alcohol abuse with cirrhosis Patient endorsed history of alcohol abuse, AST 77, ALT 25 (> 2:1) She denies any history of alcohol withdrawal Continue to monitor outpatient for withdrawal symptoms and consider CIWA protocol for positive findings -encourage cessation   History of stroke Patient had stroke at age 53 that required craniotomy   History of seizures Patient has had no recent seizures Continue home dose of phenobarbital and phenytoin   Essential hypertension Continue lisinopril at a lower dose BP controlled   GERD Continue Protonix    Medical Consultants:      Discharge Exam:   Vitals:   01/08/23 0523 01/08/23 0919  BP: 123/62 122/65  Pulse: (!) 109 85  Resp: 18 17  Temp: 98.5 F (36.9 C)   SpO2: 98% 96%   Vitals:   01/07/23 1352 01/07/23 2117 01/08/23 0523 01/08/23 0919  BP: 116/65 117/63 123/62 122/65  Pulse: (!) 106 (!) 103 (!) 109 85  Resp: 16 18 18 17   Temp: 97.8 F (36.6 C) 98 F (36.7 C) 98.5 F (36.9 C)   TempSrc: Oral Oral    SpO2: 100% 100% 98% 96%  Weight:      Height:        General exam: Appears calm and comfortable.   The results of significant diagnostics from this hospitalization (including imaging, microbiology, ancillary and laboratory) are  listed below for reference.     Procedures and Diagnostic Studies:   CT ANGIO ABDOMEN PELVIS  W &/OR WO CONTRAST  Result Date: 01/05/2023 CLINICAL DATA:  67 year old with lower GI bleed. EXAM: CTA ABDOMEN AND PELVIS WITHOUT AND WITH CONTRAST TECHNIQUE: Multidetector CT imaging of the abdomen and pelvis was performed using the standard protocol during bolus administration of intravenous contrast.  Multiplanar reconstructed images and MIPs were obtained and reviewed to evaluate the vascular anatomy. RADIATION DOSE REDUCTION: This exam was performed according to the departmental dose-optimization program which includes automated exposure control, adjustment of the mA and/or kV according to patient size and/or use of iterative reconstruction technique. CONTRAST:  56mL OMNIPAQUE IOHEXOL 350 MG/ML SOLN COMPARISON:  CT abdomen and pelvis 08/04/2022 FINDINGS: VASCULAR Aorta: Normal caliber aorta without aneurysm, dissection, vasculitis or significant stenosis. Celiac: Patent without evidence of aneurysm, dissection, vasculitis or significant stenosis. SMA: Patent without evidence of aneurysm, dissection, vasculitis or significant stenosis. Renals: Bilateral renal arteries are patent without aneurysm, dissection or significant stenosis. Single right renal artery. Two left renal arteries with a small accessory left renal artery. IMA: Patent Inflow: Patent without evidence of aneurysm, dissection, vasculitis or significant stenosis. Proximal Outflow: Proximal femoral arteries are patent bilaterally. Veins: Iliac veins, IVC and bilateral renal veins are patent. Portal venous system is patent. Review of the MIP images confirms the above findings. NON-VASCULAR Lower chest: Lung bases are clear. Hepatobiliary: Liver has a mildly nodular contour and suggestive for underlying cirrhosis. No discrete liver lesion. No biliary dilatation. Normal appearance of the gallbladder. Pancreas: Unremarkable. No pancreatic ductal dilatation or surrounding inflammatory changes. Spleen: Normal in size without focal abnormality. Adrenals/Urinary Tract: Normal adrenal glands. Negative for kidney stones. No hydronephrosis. No suspicious renal lesions. Small amount of fluid in the urinary bladder. Mild bladder wall thickening is nonspecific and could be related to under distension. Stomach/Bowel: Multiple colonic diverticula. These colonic  diverticula contain high density material and makes it difficult to evaluate for contrast extravasation. No evidence for acute GI bleeding. Normal appearance of the appendix. Normal appearance of the small bowel without dilatation. Wall thickening in the proximal stomach is likely secondary to under distension. No focal bowel inflammation. Lymphatic: No lymph node enlargement in the abdomen or pelvis. Reproductive: Status post hysterectomy. No adnexal masses. Other: Negative for free fluid.  Negative for free air. Musculoskeletal: Degenerative changes involving the left hip. No acute bone abnormality. Degenerative facet disease in lumbar spine. Mild anterolisthesis of L4 on L5. IMPRESSION: VASCULAR 1. No evidence for acute GI bleeding. 2. Main visceral arteries are patent without significant stenosis. NON-VASCULAR 1. No acute abnormality in the abdomen or pelvis. 2. Colonic diverticulosis without acute inflammation and no evidence for active bleeding. 3. Mild bladder wall thickening is nonspecific and was seen on previous exam from 05/07/2022. This may represent a chronic finding but recommend correlation with urinalysis. 4. Liver has a nodular contour and suggestive for cirrhosis. Electronically Signed   By: Markus Daft M.D.   On: 01/05/2023 19:51     Labs:   Basic Metabolic Panel: Recent Labs  Lab 01/05/23 1838 01/05/23 1847 01/06/23 0907 01/07/23 0451 01/08/23 0458  NA 130* 135 136 138 136  K 2.6* 2.6* 3.8 4.3 4.1  CL 92* 92* 101 110 107  CO2 24  --  24 22 23   GLUCOSE 135* 132* 115* 102* 93  BUN 6* 4* 5* 11 12  CREATININE 1.18* 1.10* 0.83 0.64 0.54  CALCIUM 8.3*  --  8.5* 8.1* 8.1*  MG 1.5*  --   --  1.9  --   PHOS  --   --   --  2.8  --    GFR Estimated Creatinine Clearance: 62.9 mL/min (by C-G formula based on SCr of 0.54 mg/dL). Liver Function Tests: Recent Labs  Lab 01/05/23 1838 01/07/23 0451  AST 77* 47*  ALT 25 19  ALKPHOS 120 88  BILITOT 1.2 0.8  PROT 7.3 5.3*  ALBUMIN  3.3* 2.4*   Recent Labs  Lab 01/05/23 1838  LIPASE 26   No results for input(s): "AMMONIA" in the last 168 hours. Coagulation profile Recent Labs  Lab 01/05/23 1838  INR 1.2    CBC: Recent Labs  Lab 01/05/23 1838 01/05/23 1847 01/06/23 0907 01/07/23 0451 01/08/23 0458  WBC 4.2  --  5.8 3.7* 3.2*  NEUTROABS 3.0  --   --   --   --   HGB 11.0* 12.6 10.6* 8.7* 8.8*  HCT 33.0* 37.0 31.3* 26.5* 26.6*  MCV 81.3  --  81.9 84.7 84.7  PLT 154  --  131* 107* 102*   Cardiac Enzymes: No results for input(s): "CKTOTAL", "CKMB", "CKMBINDEX", "TROPONINI" in the last 168 hours. BNP: Invalid input(s): "POCBNP" CBG: No results for input(s): "GLUCAP" in the last 168 hours. D-Dimer No results for input(s): "DDIMER" in the last 72 hours. Hgb A1c No results for input(s): "HGBA1C" in the last 72 hours. Lipid Profile No results for input(s): "CHOL", "HDL", "LDLCALC", "TRIG", "CHOLHDL", "LDLDIRECT" in the last 72 hours. Thyroid function studies No results for input(s): "TSH", "T4TOTAL", "T3FREE", "THYROIDAB" in the last 72 hours.  Invalid input(s): "FREET3" Anemia work up No results for input(s): "VITAMINB12", "FOLATE", "FERRITIN", "TIBC", "IRON", "RETICCTPCT" in the last 72 hours. Microbiology Recent Results (from the past 240 hour(s))  Urine Culture     Status: Abnormal   Collection Time: 01/05/23  9:43 PM   Specimen: Urine, Clean Catch  Result Value Ref Range Status   Specimen Description   Final    URINE, CLEAN CATCH Performed at [facility], [address], [place], [place] [address]    Special Requests   Final    NONE Performed at [facility], [address] [place], [place] [address]    Culture MULTIPLE SPECIES PRESENT, SUGGEST RECOLLECTION (A)  Final   Report Status 01/08/2023 FINAL  Final     Discharge Instructions:   Discharge Instructions     Diet general   Complete by: As directed    Discharge instructions   Complete by: As directed    Home health    Increase activity slowly   Complete by: As directed       Allergies as of 01/08/2023   No Known Allergies      Medication List     STOP taking these medications    HYDROcodone-acetaminophen 5-325 MG tablet Commonly known as: NORCO/VICODIN   lactulose 10 GM/15ML solution Commonly known as: CHRONULAC   Linzess 290 MCG Caps capsule Generic drug: linaclotide   multivitamin with minerals Tabs tablet   potassium chloride SA 20 MEQ tablet Commonly known as: KLOR-CON M   sucralfate 1 GM/10ML suspension Commonly known as: Carafate   thiamine 100 MG tablet Commonly known as: Vitamin B-1       TAKE these medications    acetaminophen 500 MG tablet Commonly known as: TYLENOL Take 500 mg by mouth every 6 (six) hours as needed. As needed for pain   albuterol 108 (90 Base) MCG/ACT inhaler Commonly known as:  VENTOLIN HFA Inhale 2 puffs into the lungs every 6 (six) hours as needed for wheezing or shortness of breath.   lisinopril 20 MG tablet Commonly known as: ZESTRIL Take 1 tablet (20 mg total) by mouth daily. Start taking on: January 09, 2023 What changed:  medication strength how much to take   pantoprazole 40 MG tablet Commonly known as: Protonix Take 1 tablet (40 mg total) by mouth daily for 7 days. What changed: when to take this   PHENobarbital 32.4 MG tablet Commonly known as: LUMINAL Take 1 tablet (32.4 mg total) by mouth 2 (two) times daily.   phenytoin 200 MG ER capsule Commonly known as: DILANTIN Take 1 capsule (200 mg total) by mouth 2 (two) times daily. What changed:  how much to take when to take this   solifenacin 5 MG tablet Commonly known as: VESICARE Take 5 mg by mouth daily.        Follow-up Information     [facility], [facility] Follow up.   Specialty: [place] Why: [facility] staff will call Pieter Partridge to arrange in home visits for you. Contact information: [address] [place]  [address] [phone]         Carrolyn Meiers, MD Follow up in 1 week(s).   Specialty: Internal Medicine Why: Cain Saupe Contact information: [place] [id number] [phone]                  Time coordinating discharge: 45 min  Signed:  Geradine Girt DO  [facility] 01/08/2023, 10:16 AM

## 2023-01-11 IMAGING — DX DG CHEST 1V PORT
1 series · 1 of 1 positions shown · non-contrast
Comparison: 11/01/2019

CLINICAL DATA: Weakness, epigastric abdominal pain

EXAM:
PORTABLE CHEST 1 VIEW

[chest ap]
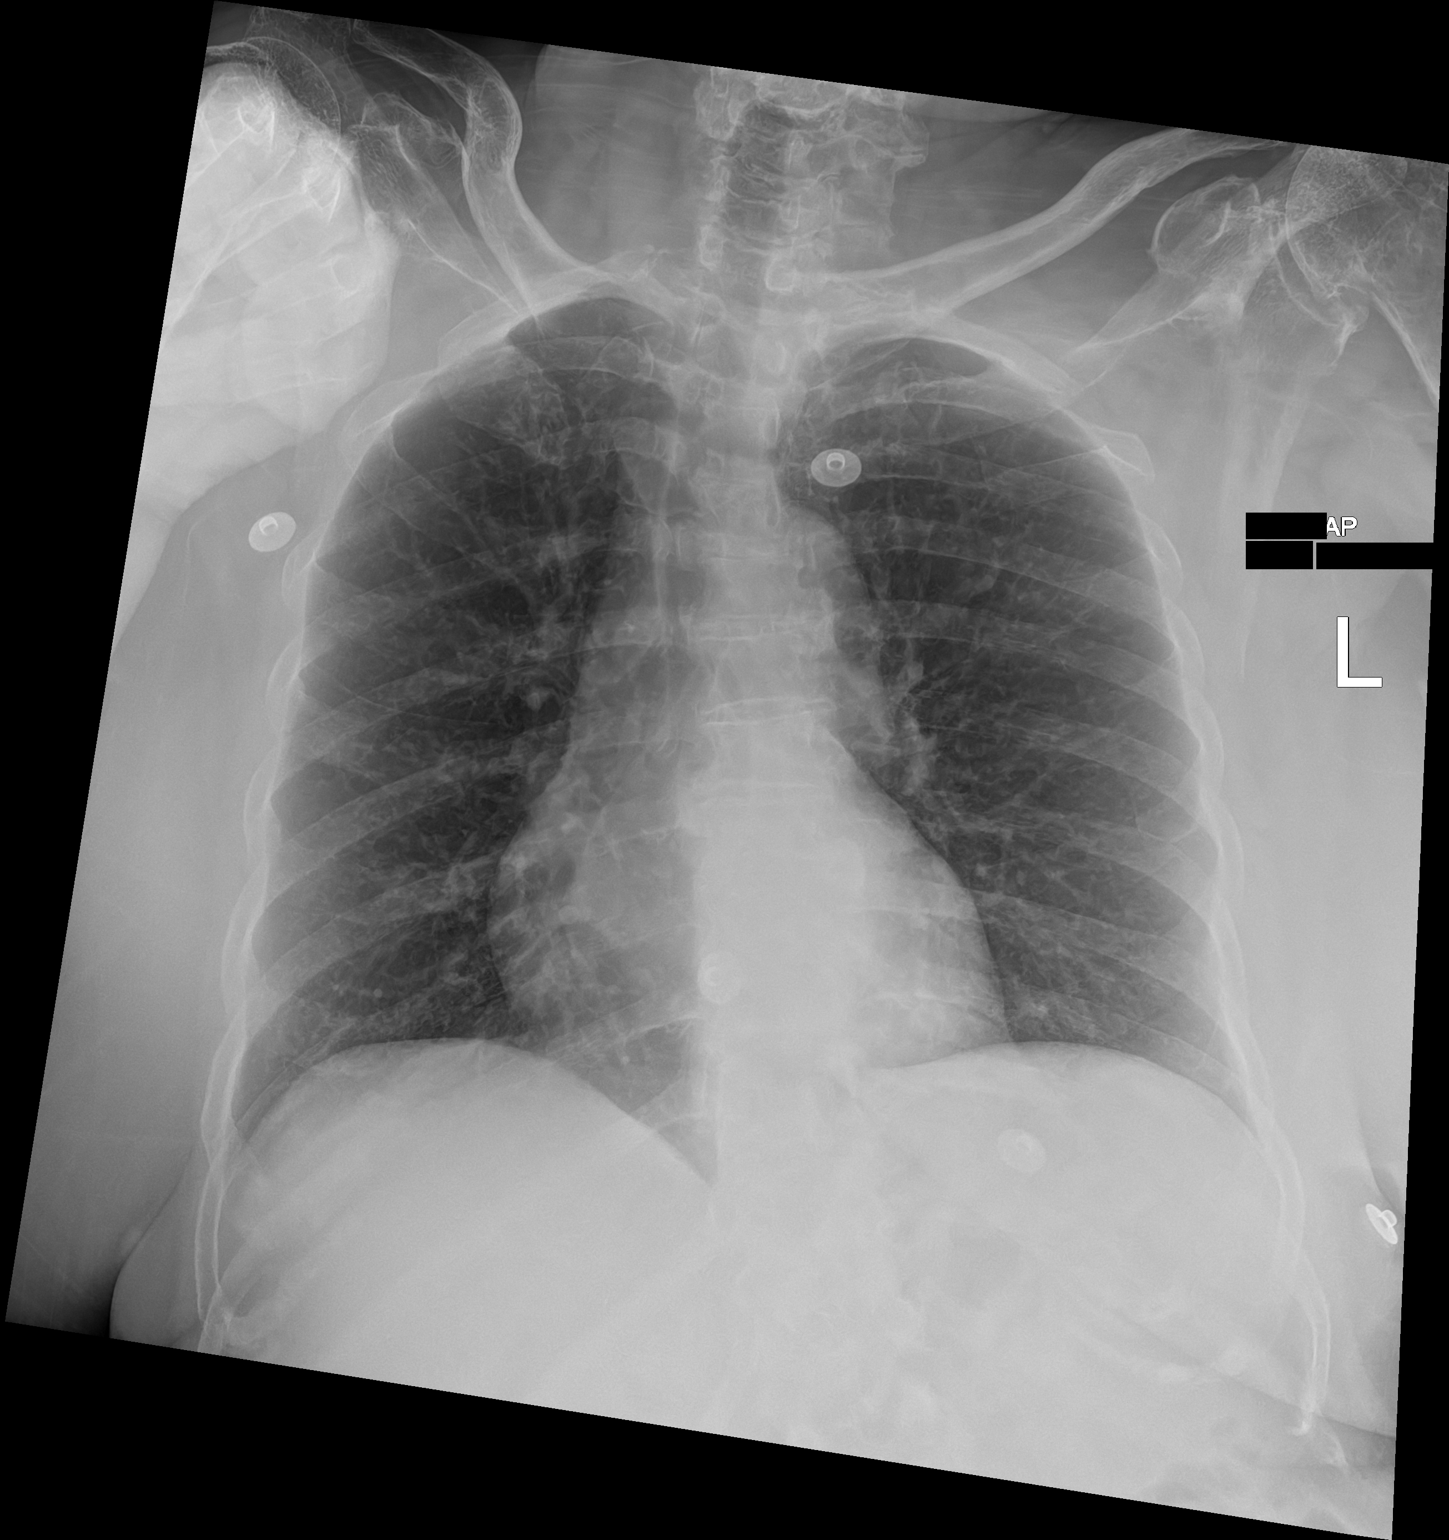

[1 of 1 positions shown; findings below may reference images not displayed]

FINDINGS: The heart size and mediastinal contours are within normal limits.
Both lungs are clear. The visualized skeletal structures are
unremarkable.
IMPRESSION: No active disease.

## 2023-03-01 ENCOUNTER — Ambulatory Visit (INDEPENDENT_AMBULATORY_CARE_PROVIDER_SITE_OTHER): Payer: 59 | Admitting: Orthopaedic Surgery

## 2023-03-01 ENCOUNTER — Encounter: Payer: Self-pay | Admitting: Orthopaedic Surgery

## 2023-03-01 DIAGNOSIS — R531 Weakness: Secondary | ICD-10-CM

## 2023-03-01 DIAGNOSIS — G8929 Other chronic pain: Secondary | ICD-10-CM | POA: Diagnosis not present

## 2023-03-01 DIAGNOSIS — M25561 Pain in right knee: Secondary | ICD-10-CM

## 2023-03-01 MED ORDER — METHYLPREDNISOLONE ACETATE 40 MG/ML IJ SUSP
40.0000 mg | Freq: Once | INTRAMUSCULAR | Status: AC
  Administered 2023-03-01: 40 mg via INTRA_ARTICULAR

## 2023-03-01 NOTE — Addendum Note (Signed)
Addended by: Isabell Bonafede W on: 03/01/2023 01:39 PM   Modules accepted: Orders  

## 2023-03-01 NOTE — Progress Notes (Signed)
PROCEDURE NOTE:  The patient requests injections of the right knee , verbal consent was obtained.  The right knee was prepped appropriately after time out was performed.   Sterile technique was observed and injection of 1 cc of DepoMedrol 40mg  with several cc's of plain xylocaine. Anesthesia was provided by ethyl chloride and a 20-gauge needle was used to inject the knee area. The injection was tolerated well.  A band aid dressing was applied.  The patient was advised to apply ice later today and tomorrow to the injection sight as needed. Encounter Diagnoses  Name Primary?   Chronic pain of right knee Yes   Right sided weakness    Return in two months.  Call if any problem.  Precautions discussed.  Electronically Signed Darreld Mclean, MD 5/14/20249:46 AM

## 2023-03-02 ENCOUNTER — Telehealth: Payer: Self-pay | Admitting: Orthopaedic Surgery

## 2023-03-02 MED ORDER — HYDROCODONE-ACETAMINOPHEN 5-325 MG PO TABS: ORAL_TABLET | ORAL | 0 refills | Status: DC

## 2023-03-02 NOTE — Telephone Encounter (Signed)
Dr. Sanjuan Dame pt - spoke w/the patient, she is requesting Oxycodone to be sent to [facility].

## 2023-03-11 NOTE — Progress Notes (Deleted)
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 Hypertension   Seizure prophylaxis: Phenytoin ER 100mg  three times daily Phenobarbital 32.4mg  twice daily *** Check CBC, CMP, phenytoin level, phenobarbital level ***   Subjective:  Lauren CATA is a 67 year old right-sided female with HTN, athma, PUD, sleep apnea, alcohol abuse with alcoholic cirrhosis, seizures and history of stroke at age 41 with residual right sided spastic hemiplegia who presents to establish care as her previous neurologist has retired.  History supplemented by prior neurologist's (referring provider) note.   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  ***.  She currently takes phenytoin ER 100mg  three times daily and phenobarbital 32.5mg  twice daily for seizure prophylaxis.  This regimen has been ***  She has history of recurrent toxic-metabolic encephalopathy in setting of alcohol intoxication, transaminitis, hyperkalemia, hyponatremia and cystitis.    CT head on 08/04/2022 personally reviewed showed atrophy, chronic small vessel ischemic changes and bifrontal/left temporal encephalomalacia and previous right-sided healed craniotomy with right parasellar aneurysm clip but no acute findings.     PAST MEDICAL HISTORY: Past Medical History:  Diagnosis Date   Alcohol abuse    Asthma    Contracture of muscle of hand    right   GERD (gastroesophageal reflux disease)    HTN (hypertension)    PUD (peptic ulcer disease)    remote   Seizures ([facility])    since stroke, no recent  seizures   Sleep apnea    Stroke [facility])    age 67, required brain surgery effected right side    PAST SURGICAL HISTORY: Past Surgical History:  Procedure Laterality Date   ABDOMINAL HYSTERECTOMY  2004   complete   BIOPSY  10/06/2022   Procedure: BIOPSY;  Surgeon: Lanelle Bal, DO;  [facility]: [facility];  Service: Endoscopy;;   BRAIN SURGERY     age 32 stroke   COLONOSCOPY     COLONOSCOPY WITH PROPOFOL  10/03/2012   Dr. Darrick Penna: moderate diverticulosis, small internal hemorrhoids, TUBULAR ADENOMA. Next colonoscopy 2023 per Dr. Darrick Penna.   COLONOSCOPY WITH PROPOFOL N/A 10/06/2022   Procedure: COLONOSCOPY WITH PROPOFOL;  Surgeon: Lanelle Bal, DO;  Location: [facility];  Service: Endoscopy;  Laterality: N/A;  9:15 am   ESOPHAGOGASTRODUODENOSCOPY N/A 10/18/2014   Dr. Jena Gauss during inpatient hospitalization: severe exudative/erosive reflux esophagitis as source of trivial upper GI bleed. No varices    ESOPHAGOGASTRODUODENOSCOPY (EGD) WITH PROPOFOL  10/03/2012   Dr. Darrick Penna: stricture at GE junction s/p dilation, mild gastritis negative H.pylori    ESOPHAGOGASTRODUODENOSCOPY (EGD) WITH PROPOFOL N/A 12/27/2017   Grade D esophagitis. Medium-sized hiatal hernia, normal duodenum   ESOPHAGOGASTRODUODENOSCOPY (EGD) WITH PROPOFOL N/A 10/06/2022   Procedure: ESOPHAGOGASTRODUODENOSCOPY (EGD) WITH PROPOFOL;  Surgeon: Lanelle Bal, DO;  Location: [facility];  Service: Endoscopy;  Laterality: N/A;   POLYPECTOMY  10/06/2022   Procedure: POLYPECTOMY;  Surgeon: Lanelle Bal, DO;  Location: [facility];  Service: Endoscopy;;   right tib-fib fracture  2008   SAVORY DILATION  10/03/2012   Procedure: SAVORY DILATION;  Surgeon: West Bali, MD;  Location: [facility];  Service: Endoscopy;  Laterality: N/A;  started at 1303,  dilated 12.8-16mm    MEDICATIONS: Current Outpatient Medications on File Prior to Visit  Medication Sig Dispense Refill   HYDROcodone-acetaminophen  (NORCO/VICODIN) 5-325 MG tablet One tablet every six hours for pain.  Limit 7 days. 28 tablet 0   acetaminophen (TYLENOL) 500 MG tablet Take 500 mg by mouth every 6 (six) hours as needed. As needed for pain     albuterol (VENTOLIN HFA) 108 (90 Base) MCG/ACT inhaler Inhale 2 puffs into the lungs every 6 (six) hours as needed for wheezing or shortness of breath. 18 g 2   lisinopril (ZESTRIL) 20 MG tablet Take 1 tablet (20 mg total) by mouth daily. 30 tablet 0   pantoprazole (PROTONIX) 40 MG tablet Take 1 tablet (40 mg total) by mouth daily for 7 days. (Patient taking differently: Take 40 mg by mouth 2 (two) times daily.) 7 tablet 0   PHENobarbital (LUMINAL) 32.4 MG tablet Take 1 tablet (32.4 mg total) by mouth 2 (two) times daily. 180 tablet 1   phenytoin (DILANTIN) 200 MG ER capsule Take 1 capsule (200 mg total) by mouth 2 (two) times daily. (Patient taking differently: Take 100 mg by mouth 3 (three) times daily.) 60 capsule 3   solifenacin (VESICARE) 5 MG tablet Take 5 mg by mouth daily.     No current facility-administered medications on file prior to visit.    ALLERGIES: No Known Allergies  FAMILY HISTORY: Family History  Problem Relation Age of Onset   Liver disease Sister        etoh   Colon cancer Neg Hx    Colon polyps Neg Hx     Objective:  *** General: No acute distress.  Patient appears well-groomed.   Head:  Normocephalic/atraumatic Eyes:  fundi examined but not visualized Neck: supple, no paraspinal tenderness, full range of motion Back: No paraspinal tenderness Heart: regular rate and rhythm Lungs: Clear to auscultation bilaterally. Vascular: No carotid bruits. Neurological Exam: Mental status: alert and oriented to person, place, and time, speech fluent and not dysarthric, language intact. Cranial nerves: CN I: not tested CN II: pupils equal, round and reactive to light, visual fields intact CN III, IV, VI:  full range of motion, no nystagmus, no ptosis CN V:  facial sensation intact. CN VII: upper and lower face symmetric CN VIII: hearing intact CN IX, X: gag intact, uvula midline CN XI: sternocleidomastoid and trapezius muscles intact CN XII: tongue midline Bulk & Tone: normal, no fasciculations. Motor:  muscle strength 5/5 throughout Sensation:  Pinprick, temperature and vibratory sensation intact. Deep Tendon Reflexes:  2+ throughout,  toes downgoing.   Finger to nose testing:  Without dysmetria.   Heel to shin:  Without dysmetria.   Gait:  Normal station and stride.  Romberg negative.    Thank you for allowing me to take part in the care of this patient.  Shon Millet, DO  CC: ***

## 2023-03-15 ENCOUNTER — Encounter: Payer: Self-pay | Admitting: Neurology

## 2023-03-15 ENCOUNTER — Ambulatory Visit: Payer: 59 | Admitting: Neurology

## 2023-05-03 ENCOUNTER — Encounter: Payer: Self-pay | Admitting: Orthopaedic Surgery

## 2023-05-03 ENCOUNTER — Ambulatory Visit (INDEPENDENT_AMBULATORY_CARE_PROVIDER_SITE_OTHER): Payer: 59 | Admitting: Orthopaedic Surgery

## 2023-05-03 DIAGNOSIS — M25561 Pain in right knee: Secondary | ICD-10-CM | POA: Diagnosis not present

## 2023-05-03 DIAGNOSIS — G8929 Other chronic pain: Secondary | ICD-10-CM | POA: Diagnosis not present

## 2023-05-03 DIAGNOSIS — R531 Weakness: Secondary | ICD-10-CM

## 2023-05-03 MED ORDER — METHYLPREDNISOLONE ACETATE 40 MG/ML IJ SUSP
40.0000 mg | Freq: Once | INTRAMUSCULAR | Status: AC
  Administered 2023-05-03: 40 mg via INTRA_ARTICULAR

## 2023-05-03 MED ORDER — HYDROCODONE-ACETAMINOPHEN 5-325 MG PO TABS: ORAL_TABLET | ORAL | 0 refills | Status: DC

## 2023-05-03 NOTE — Progress Notes (Signed)
PROCEDURE NOTE:  The patient requests injections of the right knee , verbal consent was obtained.  The right knee was prepped appropriately after time out was performed.   Sterile technique was observed and injection of 1 cc of DepoMedrol 40mg  with several cc's of plain xylocaine. Anesthesia was provided by ethyl chloride and a 20-gauge needle was used to inject the knee area. The injection was tolerated well.  A band aid dressing was applied.  The patient was advised to apply ice later today and tomorrow to the injection sight as needed.  Encounter Diagnoses  Name Primary?   Chronic pain of right knee Yes   Right sided weakness    Return in two months.  I have reviewed the [place] Controlled Substance Reporting System web site prior to prescribing narcotic medicine for this patient.  Call if any problem.  Precautions discussed.  Electronically Signed Darreld Mclean, MD 7/16/20241:29 PM

## 2023-05-30 ENCOUNTER — Emergency Department (HOSPITAL_COMMUNITY)
Admission: EM | Admit: 2023-05-30 | Discharge: 2023-05-30 | Disposition: A | Discharge status: Home / Self Care | Payer: 59 | Attending: Emergency Medicine | Admitting: Emergency Medicine

## 2023-05-30 ENCOUNTER — Emergency Department (HOSPITAL_COMMUNITY): Payer: 59

## 2023-05-30 ENCOUNTER — Encounter (HOSPITAL_COMMUNITY): Payer: Self-pay | Admitting: Emergency Medicine

## 2023-05-30 ENCOUNTER — Other Ambulatory Visit: Payer: Self-pay

## 2023-05-30 DIAGNOSIS — R1084 Generalized abdominal pain: Secondary | ICD-10-CM | POA: Diagnosis present

## 2023-05-30 DIAGNOSIS — D649 Anemia, unspecified: Secondary | ICD-10-CM | POA: Diagnosis not present

## 2023-05-30 DIAGNOSIS — R4182 Altered mental status, unspecified: Secondary | ICD-10-CM | POA: Insufficient documentation

## 2023-05-30 DIAGNOSIS — E876 Hypokalemia: Secondary | ICD-10-CM | POA: Diagnosis not present

## 2023-05-30 DIAGNOSIS — E871 Hypo-osmolality and hyponatremia: Secondary | ICD-10-CM | POA: Insufficient documentation

## 2023-05-30 DIAGNOSIS — R1110 Vomiting, unspecified: Secondary | ICD-10-CM | POA: Insufficient documentation

## 2023-05-30 DIAGNOSIS — R7401 Elevation of levels of liver transaminase levels: Secondary | ICD-10-CM

## 2023-05-30 DIAGNOSIS — G8929 Other chronic pain: Secondary | ICD-10-CM | POA: Diagnosis not present

## 2023-05-30 DIAGNOSIS — D696 Thrombocytopenia, unspecified: Secondary | ICD-10-CM | POA: Diagnosis not present

## 2023-05-30 DIAGNOSIS — R197 Diarrhea, unspecified: Secondary | ICD-10-CM | POA: Diagnosis not present

## 2023-05-30 LAB — URINALYSIS, ROUTINE W REFLEX MICROSCOPIC
Bacteria, UA: NONE SEEN
Bilirubin Urine: NEGATIVE
Glucose, UA: NEGATIVE mg/dL
Ketones, ur: NEGATIVE mg/dL
Leukocytes,Ua: NEGATIVE
Nitrite: NEGATIVE
Protein, ur: NEGATIVE mg/dL
Specific Gravity, Urine: 1.001 — ABNORMAL LOW (ref 1.005–1.030)
pH: 6 (ref 5.0–8.0)

## 2023-05-30 LAB — CBC WITH DIFFERENTIAL/PLATELET
Abs Immature Granulocytes: 0.01 10*3/uL (ref 0.00–0.07)
Basophils Absolute: 0 10*3/uL (ref 0.0–0.1)
Basophils Relative: 1 %
Eosinophils Absolute: 0.1 10*3/uL (ref 0.0–0.5)
Eosinophils Relative: 2 %
HCT: 29.5 % — ABNORMAL LOW (ref 36.0–46.0)
Hemoglobin: 10 g/dL — ABNORMAL LOW (ref 12.0–15.0)
Immature Granulocytes: 0 %
Lymphocytes Relative: 44 %
Lymphs Abs: 1.8 10*3/uL (ref 0.7–4.0)
MCH: 26.7 pg (ref 26.0–34.0)
MCHC: 33.9 g/dL (ref 30.0–36.0)
MCV: 78.7 fL — ABNORMAL LOW (ref 80.0–100.0)
Monocytes Absolute: 0.5 10*3/uL (ref 0.1–1.0)
Monocytes Relative: 13 %
Neutro Abs: 1.6 10*3/uL — ABNORMAL LOW (ref 1.7–7.7)
Neutrophils Relative %: 40 %
Platelets: 128 10*3/uL — ABNORMAL LOW (ref 150–400)
RBC: 3.75 MIL/uL — ABNORMAL LOW (ref 3.87–5.11)
RDW: 17.4 % — ABNORMAL HIGH (ref 11.5–15.5)
WBC: 4 10*3/uL (ref 4.0–10.5)
nRBC: 0 % (ref 0.0–0.2)

## 2023-05-30 LAB — COMPREHENSIVE METABOLIC PANEL
ALT: 26 U/L (ref 0–44)
AST: 81 U/L — ABNORMAL HIGH (ref 15–41)
Albumin: 3.4 g/dL — ABNORMAL LOW (ref 3.5–5.0)
Alkaline Phosphatase: 126 U/L (ref 38–126)
Anion gap: 14 (ref 5–15)
BUN: <5 mg/dL — ABNORMAL LOW (ref 8–23)
CO2: 21 mmol/L — ABNORMAL LOW (ref 22–32)
Calcium: 8.3 mg/dL — ABNORMAL LOW (ref 8.9–10.3)
Chloride: 92 mmol/L — ABNORMAL LOW (ref 98–111)
Creatinine, Ser: 0.42 mg/dL — ABNORMAL LOW (ref 0.44–1.00)
GFR, Estimated: >60 mL/min (ref >60)
Glucose, Bld: 99 mg/dL (ref 70–99)
Potassium: 2.8 mmol/L — ABNORMAL LOW (ref 3.5–5.1)
Sodium: 127 mmol/L — ABNORMAL LOW (ref 135–145)
Total Bilirubin: 0.8 mg/dL (ref 0.3–1.2)
Total Protein: 7.4 g/dL (ref 6.5–8.1)

## 2023-05-30 LAB — LIPASE, BLOOD: Lipase: 28 U/L (ref 11–51)

## 2023-05-30 LAB — MAGNESIUM: Magnesium: 1.8 mg/dL (ref 1.7–2.4)

## 2023-05-30 MED ORDER — MORPHINE SULFATE (PF) 4 MG/ML IV SOLN
4.0000 mg | Freq: Once | INTRAVENOUS | Status: AC
  Administered 2023-05-30: 4 mg via INTRAVENOUS
  Filled 2023-05-30: qty 1

## 2023-05-30 MED ORDER — POTASSIUM CHLORIDE 20 MEQ PO PACK
40.0000 meq | PACK | Freq: Once | ORAL | Status: AC
  Administered 2023-05-30: 40 meq via ORAL
  Filled 2023-05-30: qty 2

## 2023-05-30 MED ORDER — IOHEXOL 300 MG/ML  SOLN
100.0000 mL | Freq: Once | INTRAMUSCULAR | Status: AC | PRN
  Administered 2023-05-30: 100 mL via INTRAVENOUS

## 2023-05-30 MED ORDER — POTASSIUM CHLORIDE 10 MEQ/100ML IV SOLN
10.0000 meq | INTRAVENOUS | Status: AC
  Administered 2023-05-30 (×2): 10 meq via INTRAVENOUS
  Filled 2023-05-30 (×2): qty 100

## 2023-05-30 MED ORDER — POTASSIUM CHLORIDE 20 MEQ PO PACK: 20.0000 meq | PACK | Freq: Two times a day (BID) | ORAL | 0 refills | Status: DC

## 2023-05-30 MED ORDER — POTASSIUM CHLORIDE CRYS ER 20 MEQ PO TBCR
40.0000 meq | EXTENDED_RELEASE_TABLET | Freq: Once | ORAL | Status: DC
  Filled 2023-05-30: qty 2

## 2023-05-30 MED ORDER — ONDANSETRON HCL 4 MG/2ML IJ SOLN
4.0000 mg | Freq: Once | INTRAMUSCULAR | Status: AC
  Administered 2023-05-30: 4 mg via INTRAVENOUS
  Filled 2023-05-30: qty 2

## 2023-05-30 MED ORDER — MAGNESIUM SULFATE 2 GM/50ML IV SOLN
2.0000 g | Freq: Once | INTRAVENOUS | Status: AC
  Administered 2023-05-30: 2 g via INTRAVENOUS
  Filled 2023-05-30: qty 50

## 2023-05-30 NOTE — ED Notes (Signed)
Attempted to call report to sister POA Waverly Ferrari. This is attempt 2 with no answer. Convalescent transport will be contacted to assist patient home.

## 2023-05-30 NOTE — Discharge Instructions (Signed)
Please follow-up with your gastroenterologist for strategies to manage your chronic abdominal pain.  Return to the emergency department if you have new or concerning symptoms.

## 2023-05-30 NOTE — ED Notes (Signed)
Nurse manager, and [facility], Heather approved [facility] to assist patient home.

## 2023-05-30 NOTE — ED Notes (Signed)
Attempted to Contact [name], sister/emergency contact for discharge; no answer

## 2023-05-30 NOTE — ED Provider Notes (Signed)
[place] EMERGENCY DEPARTMENT AT [facility] Provider Note   CSN: [id number] Arrival date & time: 05/30/23  0100     History  Chief Complaint  Patient presents with   Abdominal Pain   Altered Mental Status    [name] is a 67 y.o. female.  The history is provided by the patient and the EMS personnel. The history is limited by the condition of the patient (Expressive aphasia).  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  She states that pain has been present for a long time but has been getting worse.  She is unable to tell me exactly when it started getting worse.  Pain does not improve after emesis or bowel movement.  She states that she is supposed to be on medication for her stomach and has been compliant with the medication.   Home Medications Prior to Admission medications   Medication Sig Start Date End Date Taking? Authorizing Provider  acetaminophen (TYLENOL) 500 MG tablet Take 500 mg by mouth every 6 (six) hours as needed. As needed for pain    [provider]  albuterol (VENTOLIN HFA) 108 (90 Base) MCG/ACT inhaler Inhale 2 puffs into the lungs every 6 (six) hours as needed for wheezing or shortness of breath. 08/06/22   Shon Hale, MD  HYDROcodone-acetaminophen (NORCO/VICODIN) 5-325 MG tablet One tablet every six hours for pain.  Limit 7 days. 05/03/23   Darreld Mclean, MD  lisinopril (ZESTRIL) 20 MG tablet Take 1 tablet (20 mg total) by mouth daily. 01/09/23   Joseph Art, DO  pantoprazole (PROTONIX) 40 MG tablet Take 1 tablet (40 mg total) by mouth daily for 7 days. Patient taking differently: Take 40 mg by mouth 2 (two) times daily. 10/09/22 01/06/23  Mesner, Barbara Cower, MD  PHENobarbital (LUMINAL) 32.4 MG tablet Take 1 tablet  (32.4 mg total) by mouth 2 (two) times daily. 08/06/22   Shon Hale, MD  phenytoin (DILANTIN) 200 MG ER capsule Take 1 capsule (200 mg total) by mouth 2 (two) times daily. Patient taking differently: Take 100 mg by mouth 3 (three) times daily. 08/06/22   Shon Hale, MD  solifenacin (VESICARE) 5 MG tablet Take 5 mg by mouth daily. 11/24/22   [provider]      Allergies    Patient has no known allergies.    Review of Systems   Review of Systems  Gastrointestinal:  Positive for abdominal pain.  All other systems reviewed and are negative.   Physical Exam Updated Vital Signs BP 138/73 (BP Location: Left Arm)   Pulse 87   Temp 97.8 F (36.6 C) (Oral)   Resp 20   Ht 5\' 3"  (1.6 m)   Wt 64 kg   SpO2 100%   BMI 24.99 kg/m  Physical Exam Vitals and nursing note reviewed.   67 year old female, resting comfortably and in no acute distress. Vital signs are normal. Oxygen saturation is 100%, which is normal. Head is normocephalic and atraumatic. PERRLA, EOMI. Oropharynx is clear. Neck is nontender and supple without adenopathy or JVD. Back is nontender and there is no CVA tenderness. Lungs are clear without rales, wheezes, or rhonchi. Chest is nontender. Heart has regular rate and rhythm without murmur. Abdomen is soft, flat, with moderate tenderness diffusely.  There is no rebound or guarding.  Extremities have no cyanosis or edema.  Flexion contracture present right arm. Skin is warm and dry without rash. Neurologic: Awake and alert, Broca's type aphasia present, cranial nerves are intact, right hemiparesis present.  ED Results / Procedures / Treatments   Labs (all labs ordered are listed, but only abnormal results are displayed) Labs Reviewed  COMPREHENSIVE METABOLIC PANEL - Abnormal; Notable for the following components:      Result Value   Sodium 127 (*)    Potassium 2.8 (*)    Chloride 92 (*)    CO2 21 (*)    BUN <5 (*)    Creatinine, Ser 0.42 (*)     Calcium 8.3 (*)    Albumin 3.4 (*)    AST 81 (*)    All other components within normal limits  URINALYSIS, ROUTINE W REFLEX MICROSCOPIC - Abnormal; Notable for the following components:   Color, Urine STRAW (*)    Specific Gravity, Urine 1.001 (*)    Hgb urine dipstick SMALL (*)    All other components within normal limits  CBC WITH DIFFERENTIAL/PLATELET - Abnormal; Notable for the following components:   RBC 3.75 (*)    Hemoglobin 10.0 (*)    HCT 29.5 (*)    MCV 78.7 (*)    RDW 17.4 (*)    Platelets 128 (*)    Neutro Abs 1.6 (*)    All other components within normal limits  LIPASE, BLOOD  MAGNESIUM    EKG EKG Interpretation Date/Time:  Monday May 30 2023 01:27:10 EDT Ventricular Rate:  88 PR Interval:  207 QRS Duration:  89 QT Interval:  375 QTC Calculation: 454 R Axis:   35  Text Interpretation: Sinus rhythm Abnormal R-wave progression, early transition When compared with ECG of 01/05/2023, QT has shortened Confirmed by Dione Booze ([id number]) on 05/30/2023 1:34:45 AM  Radiology CT ABDOMEN PELVIS W CONTRAST  Result Date: 05/30/2023 CLINICAL DATA:  Generalized abdominal pain EXAM: CT ABDOMEN AND PELVIS WITH CONTRAST TECHNIQUE: Multidetector CT imaging of the abdomen and pelvis was performed using the standard protocol following bolus administration of intravenous contrast. RADIATION DOSE REDUCTION: This exam was performed according to the departmental dose-optimization program which includes automated exposure control, adjustment of the mA and/or kV according to patient size and/or use of iterative reconstruction technique. CONTRAST:  OMNIPAQUE IOHEXOL 300 MG/ML  SOLN COMPARISON:  01/05/2023 FINDINGS: Lower chest: Lung bases are free of acute infiltrate or sizable effusion. Hepatobiliary: Gallbladder is within normal limits. Mild nodularity of the hepatic surface is noted suggestive of cirrhosis. No mass lesion is noted. Pancreas: Unremarkable. No pancreatic ductal  dilatation or surrounding inflammatory changes. Spleen: Normal in size without focal abnormality. Adrenals/Urinary Tract: Adrenal glands are within normal limits. Kidneys demonstrate a normal enhancement pattern bilaterally. No renal calculi or obstructive changes are seen. The bladder is well distended. Stomach/Bowel: Scattered diverticular change of the colon is noted. No evidence of diverticulitis is seen. The appendix is within normal limits. Stomach is within normal limits with the exception of a small sliding-type hiatal hernia.Mildly prominent loops of proximal jejunum are noted without definitive transition point. This may represent some mild enteritis. No true obstructive changes are seen. Vascular/Lymphatic: Aortic atherosclerosis. No enlarged abdominal or pelvic lymph nodes. Reproductive: Status post hysterectomy. No adnexal masses. Other: No abdominal wall hernia or abnormality. No abdominopelvic ascites. Musculoskeletal: No acute or significant osseous findings. IMPRESSION: Mildly dilated loops of proximal jejunum which may represent some mild enteritis. No true obstructive changes are seen. Diverticulosis  without diverticulitis. Cirrhotic change of the liver. Electronically Signed   By: Alcide Clever M.D.   On: 05/30/2023 03:04    Procedures Procedures  Cardiac monitor shows normal sinus rhythm, per my interpretation.  Medications Ordered in ED Medications  morphine (PF) 4 MG/ML injection 4 mg (4 mg Intravenous Given 05/30/23 0204)  ondansetron (ZOFRAN) injection 4 mg (4 mg Intravenous Given 05/30/23 0204)  iohexol (OMNIPAQUE) 300 MG/ML solution 100 mL (100 mLs Intravenous Contrast Given 05/30/23 0222)  potassium chloride 10 mEq in 100 mL IVPB (0 mEq Intravenous Stopped 05/30/23 0520)  magnesium sulfate IVPB 2 g 50 mL (0 g Intravenous Stopped 05/30/23 0639)  potassium chloride (KLOR-CON) packet 40 mEq (40 mEq Oral Given 05/30/23 0347)    ED Course/ Medical Decision Making/ A&P                                  Medical Decision Making Amount and/or Complexity of Data Reviewed Labs: ordered. Radiology: ordered.  Risk Prescription drug management.   Generalized abdominal pain of uncertain cause.  Differential diagnosis is broad and includes, but is not limited to, diverticulitis, cholecystitis, pancreatitis, peptic ulcer disease, perforated viscus, bowel obstruction.  This differential diagnosis includes conditions with significant risk for morbidity and complications.  However, chronicity of symptoms argues against serious pathology.  I have reviewed her past records, and she had a CT angiogram of her abdomen and pelvis on 01/05/2023 which showed no acute pathology although cirrhosis and diverticulosis were noted.  She has had 20 CT scans of abdomen and pelvis since 2020, CT abdomen and pelvis on 10/16/2021 showed diverticulitis but remaining scans showed no acute process which was treated as an outpatient.  CT scan shows changes of cirrhosis and diverticulosis without any acute process.  I have independently viewed the images, and agree with radiologist's interpretation.  I have reviewed and interpreted her laboratory tests, and my interpretation is hyponatremia, hypokalemia, stable anemia, stable thrombocytopenia.  Because of hypokalemia, I ordered a magnesium level which came back at the lower end of normal.  I have ordered intravenous magnesium and potassium and I have also ordered oral potassium.  I have advised the patient that her workup did not show any serious pathology and she would need to follow-up with her primary care provider and her gastroenterologist.  I am discharging her with prescription for oral potassium.  Final Clinical Impression(s) / ED Diagnoses Final diagnoses:  Chronic abdominal pain  Hypokalemia  Hyponatremia  Elevated AST (SGOT)  Normochromic normocytic anemia  Thrombocytopenia (HCC)    Rx / DC Orders ED Discharge Orders          Ordered    potassium  chloride (KLOR-CON) 20 MEQ packet  2 times daily        05/30/23 0545              Dione Booze, MD 05/30/23 [phone]

## 2023-05-30 NOTE — ED Notes (Signed)
Patient confirmed address and that someone would be at home to assist her into the home. Pt has been handed off the [facility] transport.

## 2023-05-30 NOTE — ED Notes (Addendum)
Assisted patient on and off the bed pan.

## 2023-05-30 NOTE — ED Triage Notes (Addendum)
Pt brought in by EMS for c/o abdominal pain. EMS states that pt is known to them and although A&O x 4, "seems slower to respond than usual". Pt reports generalized abdominal pain that she states feels like "ulcers". CBG was 124.

## 2023-05-30 NOTE — ED Notes (Signed)
Pt assisted on and off of bed pan.

## 2023-06-23 ENCOUNTER — Emergency Department (HOSPITAL_COMMUNITY)
Admission: EM | Admit: 2023-06-23 | Discharge: 2023-06-23 | Disposition: A | Discharge status: Home / Self Care | Payer: 59 | Attending: Emergency Medicine | Admitting: Emergency Medicine

## 2023-06-23 ENCOUNTER — Other Ambulatory Visit: Payer: Self-pay

## 2023-06-23 DIAGNOSIS — R1084 Generalized abdominal pain: Secondary | ICD-10-CM | POA: Diagnosis present

## 2023-06-23 DIAGNOSIS — K2900 Acute gastritis without bleeding: Secondary | ICD-10-CM

## 2023-06-23 LAB — ETHANOL: Alcohol, Ethyl (B): 303 mg/dL (ref <10)

## 2023-06-23 LAB — CBC
HCT: 35 % — ABNORMAL LOW (ref 36.0–46.0)
Hemoglobin: 11 g/dL — ABNORMAL LOW (ref 12.0–15.0)
MCH: 26.8 pg (ref 26.0–34.0)
MCHC: 31.4 g/dL (ref 30.0–36.0)
MCV: 85.2 fL (ref 80.0–100.0)
Platelets: 147 10*3/uL — ABNORMAL LOW (ref 150–400)
RBC: 4.11 MIL/uL (ref 3.87–5.11)
RDW: 17.2 % — ABNORMAL HIGH (ref 11.5–15.5)
WBC: 5 10*3/uL (ref 4.0–10.5)
nRBC: 0 % (ref 0.0–0.2)

## 2023-06-23 LAB — COMPREHENSIVE METABOLIC PANEL
ALT: 21 U/L (ref 0–44)
AST: 51 U/L — ABNORMAL HIGH (ref 15–41)
Albumin: 3.7 g/dL (ref 3.5–5.0)
Alkaline Phosphatase: 119 U/L (ref 38–126)
Anion gap: 14 (ref 5–15)
BUN: <5 mg/dL — ABNORMAL LOW (ref 8–23)
CO2: 19 mmol/L — ABNORMAL LOW (ref 22–32)
Calcium: 8.7 mg/dL — ABNORMAL LOW (ref 8.9–10.3)
Chloride: 96 mmol/L — ABNORMAL LOW (ref 98–111)
Creatinine, Ser: 0.61 mg/dL (ref 0.44–1.00)
GFR, Estimated: >60 mL/min (ref >60)
Glucose, Bld: 80 mg/dL (ref 70–99)
Potassium: 3 mmol/L — ABNORMAL LOW (ref 3.5–5.1)
Sodium: 129 mmol/L — ABNORMAL LOW (ref 135–145)
Total Bilirubin: 0.9 mg/dL (ref 0.3–1.2)
Total Protein: 8 g/dL (ref 6.5–8.1)

## 2023-06-23 LAB — LIPASE, BLOOD: Lipase: 31 U/L (ref 11–51)

## 2023-06-23 MED ORDER — POTASSIUM CHLORIDE CRYS ER 20 MEQ PO TBCR
40.0000 meq | EXTENDED_RELEASE_TABLET | Freq: Once | ORAL | Status: AC
  Administered 2023-06-23: 40 meq via ORAL
  Filled 2023-06-23: qty 2

## 2023-06-23 NOTE — ED Notes (Signed)
Per sister, ride on way.

## 2023-06-23 NOTE — Discharge Instructions (Signed)
Avoid alcohol.  Take your Protonix to protect your stomach.

## 2023-06-23 NOTE — ED Notes (Addendum)
Pt was asleep upon entrance. Woke with calling name but groggy. Advised will give potassium and see if can find ride home since pt d/c. Pt agreed. Nad. Pt denies any abd pain at this time.

## 2023-06-23 NOTE — ED Triage Notes (Signed)
Pt bib [facility] from home c/o abdominal pain. Per EMS family member reported that pt has drank at least 1 case of beer and multiple red bulls today.

## 2023-06-23 NOTE — ED Provider Notes (Signed)
[place] EMERGENCY DEPARTMENT AT [facility] Provider Note   CSN: [id number] Arrival date & time: 06/23/23  0121     History  Chief Complaint  Patient presents with   Abdominal Pain    [name] is a 67 y.o. female.  Presents to the emergency department for evaluation of abdominal pain.  Patient reports that she is hurting all over.  She has drank at least a case of beer today and also has had multiple red bowls.  No vomiting.  No diarrhea.  No fever.       Home Medications Prior to Admission medications   Medication Sig Start Date End Date Taking? Authorizing Provider  acetaminophen (TYLENOL) 500 MG tablet Take 500 mg by mouth every 6 (six) hours as needed. As needed for pain    [provider]  albuterol (VENTOLIN HFA) 108 (90 Base) MCG/ACT inhaler Inhale 2 puffs into the lungs every 6 (six) hours as needed for wheezing or shortness of breath. 08/06/22   Shon Hale, MD  HYDROcodone-acetaminophen (NORCO/VICODIN) 5-325 MG tablet One tablet every six hours for pain.  Limit 7 days. 05/03/23   Darreld Mclean, MD  lisinopril (ZESTRIL) 20 MG tablet Take 1 tablet (20 mg total) by mouth daily. 01/09/23   Joseph Art, DO  pantoprazole (PROTONIX) 40 MG tablet Take 1 tablet (40 mg total) by mouth daily for 7 days. Patient taking differently: Take 40 mg by mouth 2 (two) times daily. 10/09/22 01/06/23  Mesner, Barbara Cower, MD  PHENobarbital (LUMINAL) 32.4 MG tablet Take 1 tablet (32.4 mg total) by mouth 2 (two) times daily. 08/06/22   Shon Hale, MD  phenytoin (DILANTIN) 200 MG ER capsule Take 1 capsule (200 mg total) by mouth 2 (two) times daily. Patient taking differently: Take 100 mg by mouth 3 (three) times daily. 08/06/22   Shon Hale, MD  potassium chloride (KLOR-CON) 20 MEQ packet Take 20 mEq by mouth 2 (two) times daily. 05/30/23   Dione Booze, MD  solifenacin (VESICARE) 5 MG tablet Take 5 mg by mouth daily. 11/24/22   [provider]       Allergies    Patient has no known allergies.    Review of Systems   Review of Systems  Physical Exam Updated Vital Signs BP (!) 94/43 (BP Location: Left Arm)   Pulse 76   Temp 98.6 F (37 C) (Oral)   Resp 13   SpO2 100%  Physical Exam Vitals and nursing note reviewed.  Constitutional:      General: She is not in acute distress.    Appearance: She is well-developed.  HENT:     Head: Normocephalic and atraumatic.     Mouth/Throat:     Mouth: Mucous membranes are moist.  Eyes:     General: Vision grossly intact. Gaze aligned appropriately.     Extraocular Movements: Extraocular movements intact.     Conjunctiva/sclera: Conjunctivae normal.  Cardiovascular:     Rate and Rhythm: Normal rate and regular rhythm.     Pulses: Normal pulses.     Heart sounds: Normal heart sounds, S1 normal and S2 normal. No murmur heard.    No friction rub. No gallop.  Pulmonary:     Effort: Pulmonary effort is normal. No respiratory distress.     Breath sounds: Normal breath sounds.  Abdominal:     General: Bowel sounds are normal.     Palpations: Abdomen is soft.     Tenderness: There is generalized abdominal tenderness. There  is no guarding or rebound.     Hernia: No hernia is present.  Musculoskeletal:        General: No swelling.     Cervical back: Full passive range of motion without pain, normal range of motion and neck supple. No spinous process tenderness or muscular tenderness. Normal range of motion.     Right lower leg: No edema.     Left lower leg: No edema.  Skin:    General: Skin is warm and dry.     Capillary Refill: Capillary refill takes less than 2 seconds.     Findings: No ecchymosis, erythema, rash or wound.  Neurological:     General: No focal deficit present.     Mental Status: She is alert and oriented to person, place, and time.     GCS: GCS eye subscore is 4. GCS verbal subscore is 5. GCS motor subscore is 6.     Cranial Nerves: Cranial nerves 2-12 are intact.      Sensory: Sensation is intact.     Motor: Motor function is intact.     Coordination: Coordination is intact.  Psychiatric:        Attention and Perception: Attention normal.        Mood and Affect: Mood normal.        Speech: Speech normal.        Behavior: Behavior normal.     ED Results / Procedures / Treatments   Labs (all labs ordered are listed, but only abnormal results are displayed) Labs Reviewed  COMPREHENSIVE METABOLIC PANEL - Abnormal; Notable for the following components:      Result Value   Sodium 129 (*)    Potassium 3.0 (*)    Chloride 96 (*)    CO2 19 (*)    BUN <5 (*)    Calcium 8.7 (*)    AST 51 (*)    All other components within normal limits  CBC - Abnormal; Notable for the following components:   Hemoglobin 11.0 (*)    HCT 35.0 (*)    RDW 17.2 (*)    Platelets 147 (*)    All other components within normal limits  ETHANOL - Abnormal; Notable for the following components:   Alcohol, Ethyl (B) 303 (*)    All other components within normal limits  LIPASE, BLOOD  URINALYSIS, ROUTINE W REFLEX MICROSCOPIC    EKG None  Radiology No results found.  Procedures Procedures    Medications Ordered in ED Medications - No data to display  ED Course/ Medical Decision Making/ A&P                                 Medical Decision Making Amount and/or Complexity of Data Reviewed Labs: ordered.   Differential Diagnosis considered includes, but not limited to: Cholelithiasis; cholecystitis; cholangitis; bowel obstruction; esophagitis; gastritis; peptic ulcer disease; pancreatitis; cardiac.  Presents to the emergency department for evaluation of abdominal pain.  Patient acutely intoxicated.  Alcohol level 300.  Remainder of labs are unremarkable.  Abdominal exam is benign, suspect that there complaints are secondary to the acute intoxication, possibly gastritis.  No evidence of bleeding or anemia.  Patient has been monitored through the night and has  done well.        Final Clinical Impression(s) / ED Diagnoses Final diagnoses:  Generalized abdominal pain    Rx / DC Orders ED Discharge Orders  None         Gilda Crease, MD 06/23/23 [phone]

## 2023-07-05 ENCOUNTER — Ambulatory Visit (INDEPENDENT_AMBULATORY_CARE_PROVIDER_SITE_OTHER): Payer: 59 | Admitting: Orthopaedic Surgery

## 2023-07-05 ENCOUNTER — Encounter: Payer: Self-pay | Admitting: Orthopaedic Surgery

## 2023-07-05 DIAGNOSIS — M25561 Pain in right knee: Secondary | ICD-10-CM

## 2023-07-05 DIAGNOSIS — R531 Weakness: Secondary | ICD-10-CM

## 2023-07-05 DIAGNOSIS — G8929 Other chronic pain: Secondary | ICD-10-CM | POA: Diagnosis not present

## 2023-07-05 MED ORDER — HYDROCODONE-ACETAMINOPHEN 5-325 MG PO TABS: ORAL_TABLET | ORAL | 0 refills | Status: DC

## 2023-07-05 MED ORDER — METHYLPREDNISOLONE ACETATE 40 MG/ML IJ SUSP
40.0000 mg | Freq: Once | INTRAMUSCULAR | Status: AC
Start: 2023-07-05 — End: 2023-07-05
  Administered 2023-07-05: 40 mg via INTRA_ARTICULAR

## 2023-07-05 NOTE — Progress Notes (Signed)
PROCEDURE NOTE:  The patient requests injections of the right knee , verbal consent was obtained.  The right knee was prepped appropriately after time out was performed.   Sterile technique was observed and injection of 1 cc of DepoMedrol 40mg  with several cc's of plain xylocaine. Anesthesia was provided by ethyl chloride and a 20-gauge needle was used to inject the knee area. The injection was tolerated well.  A band aid dressing was applied.  The patient was advised to apply ice later today and tomorrow to the injection sight as needed.  Encounter Diagnoses  Name Primary?   Chronic pain of right knee Yes   Right sided weakness    I have reviewed the [place] Controlled Substance Reporting System web site prior to prescribing narcotic medicine for this patient.  Return in two months.  Call if any problem.  Precautions discussed.  Electronically Signed Darreld Mclean, MD 9/17/20249:01 AM

## 2023-07-05 NOTE — Addendum Note (Signed)
Addended by: Michaele Offer on: 07/05/2023 11:21 AM   Modules accepted: Orders

## 2023-07-19 ENCOUNTER — Telehealth: Payer: Self-pay

## 2023-07-19 NOTE — Telephone Encounter (Signed)
Transition Care Management Follow-up Telephone Call Date of discharge and from where: 06/23/2023 [facility] How have you been since you were released from the hospital? Patient stated she is feeling much better. Any questions or concerns? No  Items Reviewed: Did the pt receive and understand the discharge instructions provided? Yes  Medications obtained and verified?  No medication prescribed. Other? No  Any new allergies since your discharge? No  Dietary orders reviewed? Yes Do you have support at home? Yes   Follow up appointments reviewed:  PCP Hospital f/u appt confirmed? No  Scheduled to see  on  @ . Specialist Hospital f/u appt confirmed? No  Scheduled to see  on  @ . Are transportation arrangements needed? No  If their condition worsens, is the pt aware to call PCP or go to the Emergency Dept.? Yes Was the patient provided with contact information for the PCP's office or ED? Yes Was to pt encouraged to call back with questions or concerns? Yes  Athanasia Stanwood [facility]  [facility] Guide Direct Dial: [phone]  Website: [email or web address]

## 2023-09-06 ENCOUNTER — Ambulatory Visit: Payer: 59 | Admitting: Orthopaedic Surgery

## 2023-09-07 ENCOUNTER — Encounter: Payer: Self-pay | Admitting: Orthopaedic Surgery

## 2023-09-07 ENCOUNTER — Ambulatory Visit (INDEPENDENT_AMBULATORY_CARE_PROVIDER_SITE_OTHER): Payer: 59 | Admitting: Orthopaedic Surgery

## 2023-09-07 DIAGNOSIS — G8929 Other chronic pain: Secondary | ICD-10-CM | POA: Diagnosis not present

## 2023-09-07 DIAGNOSIS — R531 Weakness: Secondary | ICD-10-CM

## 2023-09-07 DIAGNOSIS — M25561 Pain in right knee: Secondary | ICD-10-CM

## 2023-09-07 MED ORDER — HYDROCODONE-ACETAMINOPHEN 5-325 MG PO TABS: ORAL_TABLET | ORAL | 0 refills | Status: DC

## 2023-09-07 MED ORDER — METHYLPREDNISOLONE ACETATE 40 MG/ML IJ SUSP
40.0000 mg | Freq: Once | INTRAMUSCULAR | Status: AC
Start: 2023-09-07 — End: 2023-09-07
  Administered 2023-09-07: 40 mg via INTRA_ARTICULAR

## 2023-09-07 NOTE — Progress Notes (Signed)
PROCEDURE NOTE:  The patient requests injections of the right knee , verbal consent was obtained.  The right knee was prepped appropriately after time out was performed.   Sterile technique was observed and injection of 1 cc of DepoMedrol 40mg  with several cc's of plain xylocaine. Anesthesia was provided by ethyl chloride and a 20-gauge needle was used to inject the knee area. The injection was tolerated well.  A band aid dressing was applied.  The patient was advised to apply ice later today and tomorrow to the injection sight as needed.  Encounter Diagnoses  Name Primary?   Chronic pain of right knee Yes   Right sided weakness    I have reviewed the [place] Controlled Substance Reporting System web site prior to prescribing narcotic medicine for this patient.  Return in two months.  Call if any problem.  Precautions discussed.  Electronically Signed Darreld Mclean, MD 11/20/20241:27 PM

## 2023-09-07 NOTE — Addendum Note (Signed)
Addended by: Michaele Offer on: 09/07/2023 03:19 PM   Modules accepted: Orders

## 2023-09-13 MED ORDER — METHYLPREDNISOLONE ACETATE 40 MG/ML IJ SUSP
40.0000 mg | Freq: Once | INTRAMUSCULAR | Status: AC
Start: 2023-09-13 — End: 2023-09-13
  Administered 2023-09-13: 40 mg via INTRA_ARTICULAR

## 2023-09-13 NOTE — Addendum Note (Signed)
Addended by: Michaele Offer on: 09/13/2023 08:27 AM   Modules accepted: Orders

## 2023-12-24 IMAGING — CT CT ABD-PELV W/O CM
2 of 4 series · 15 of 46 positions shown, 17 images · non-contrast
Comparison: CT abdomen pelvis dated 10/17/2021.

CLINICAL DATA: Abdominal pain.



[Series 2: axial st · axial · 0.90mm/px · z∈[-482,-72]mm · 12 of 94 slices shown, 14 images]
[im 6/94  soft-tissue]
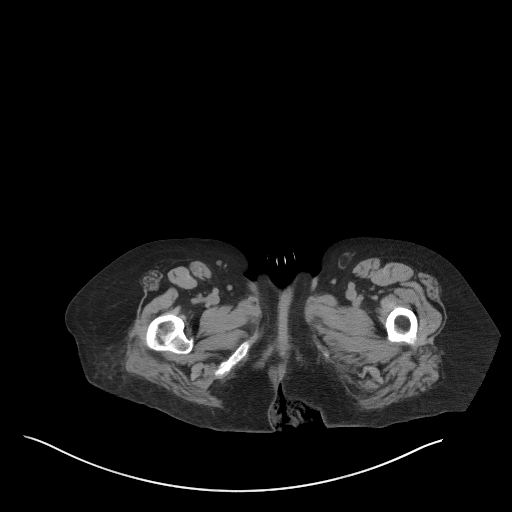
[im 6/94  bone]
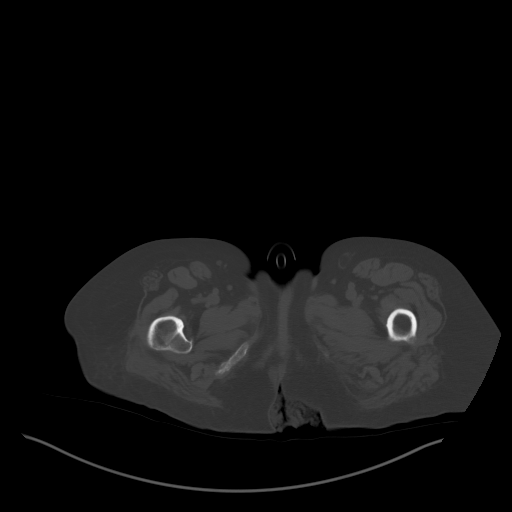
[im 12/94  soft-tissue]
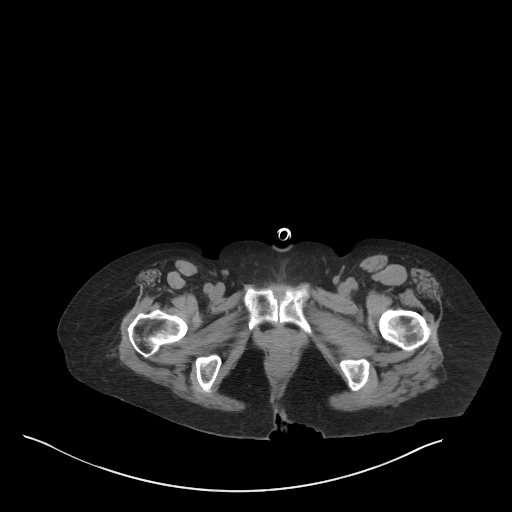
[im 24/94  soft-tissue]
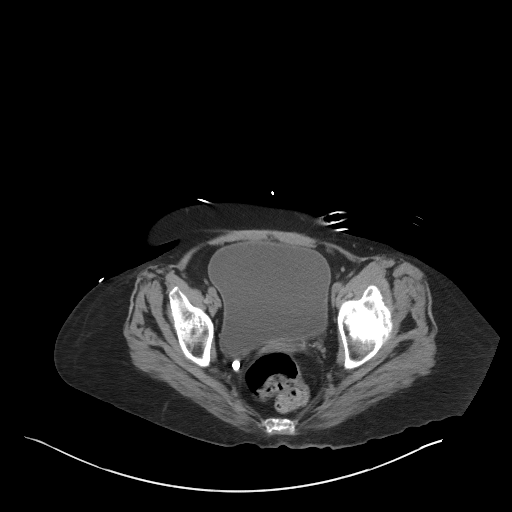
[im 30/94  soft-tissue]
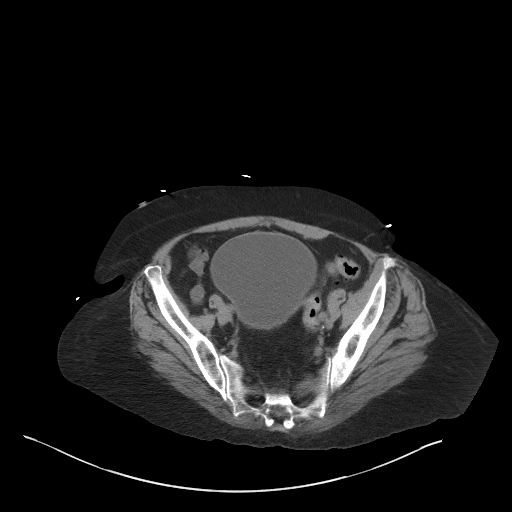
[im 35/94  soft-tissue]
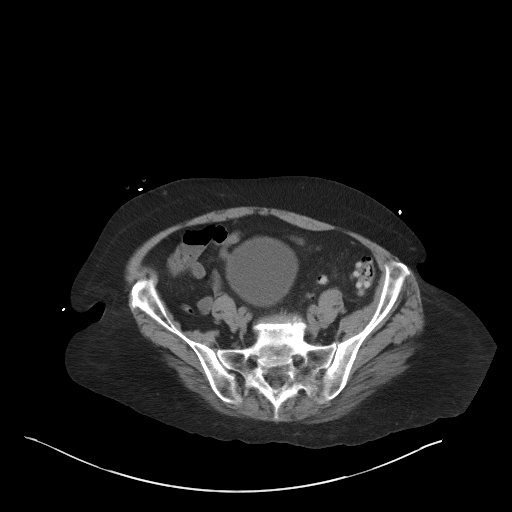
[im 41/94  soft-tissue]
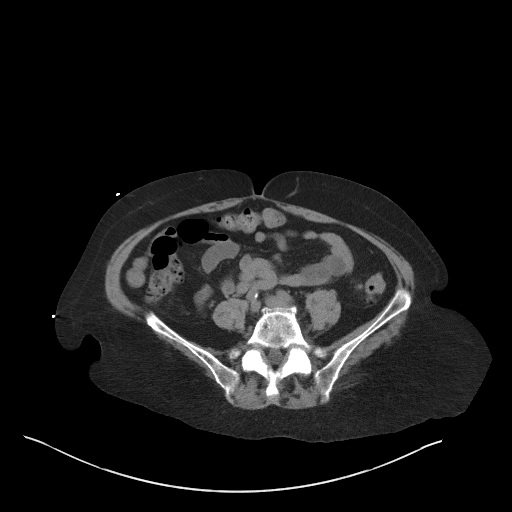
[im 53/94  soft-tissue]
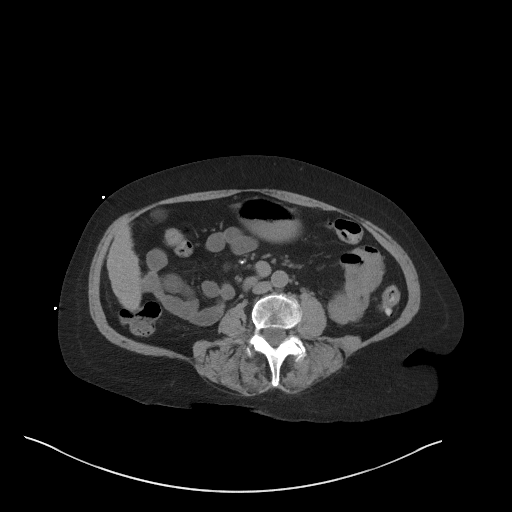
[im 59/94  soft-tissue]
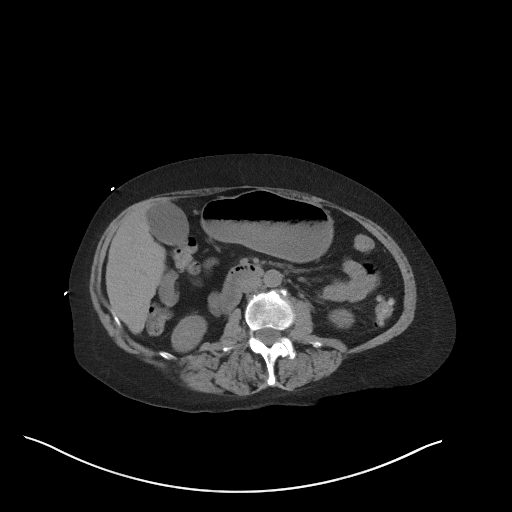
[im 64/94  soft-tissue]
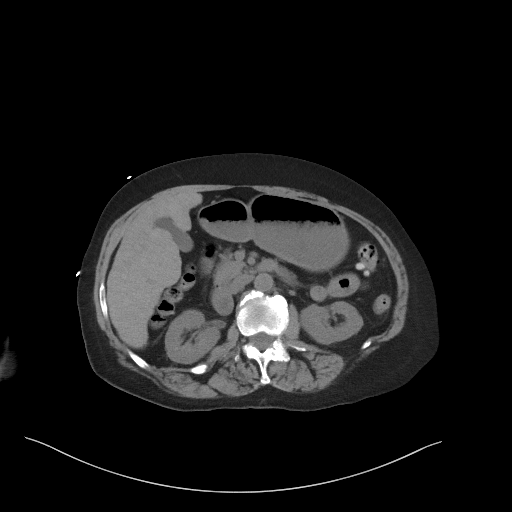
[im 64/94  bone]
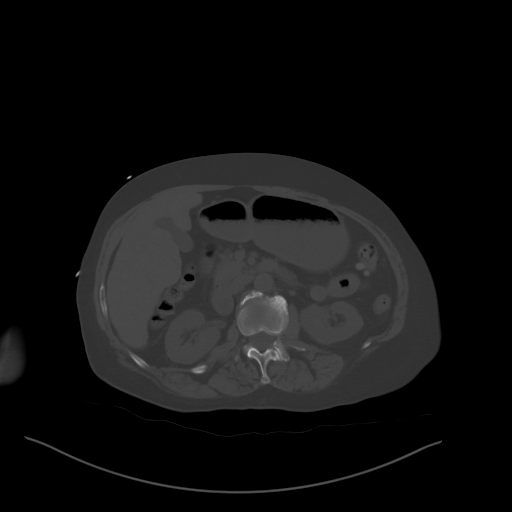
[im 70/94  soft-tissue]
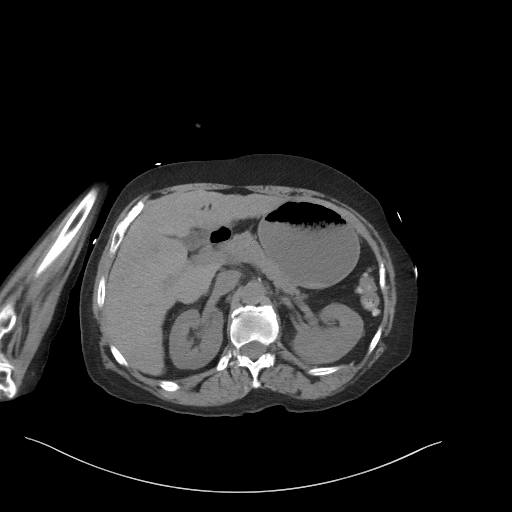
[im 82/94  soft-tissue]
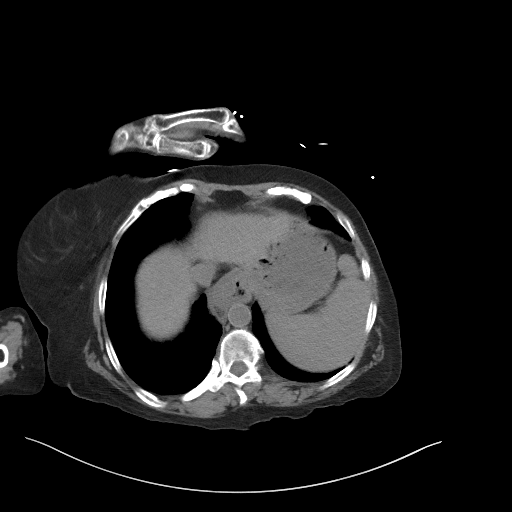
[im 88/94  soft-tissue]
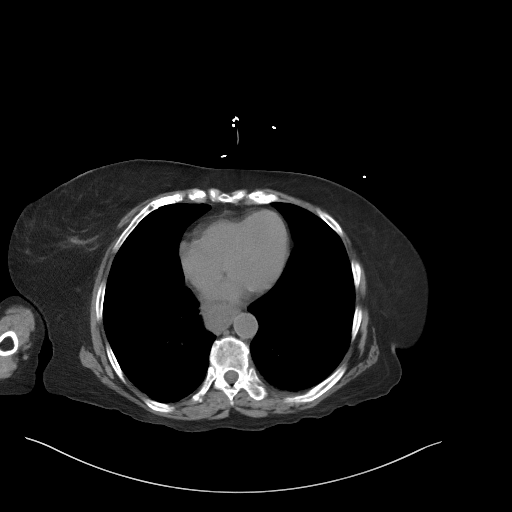

[Series 5: coronal st · coronal · 0.71mm/px · 3 of 88 slices shown]
[im 30/88  soft-tissue]
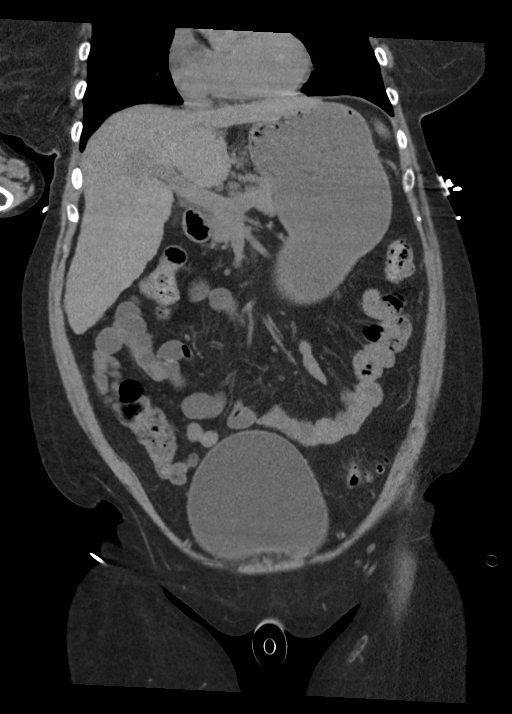
[im 39/88  soft-tissue]
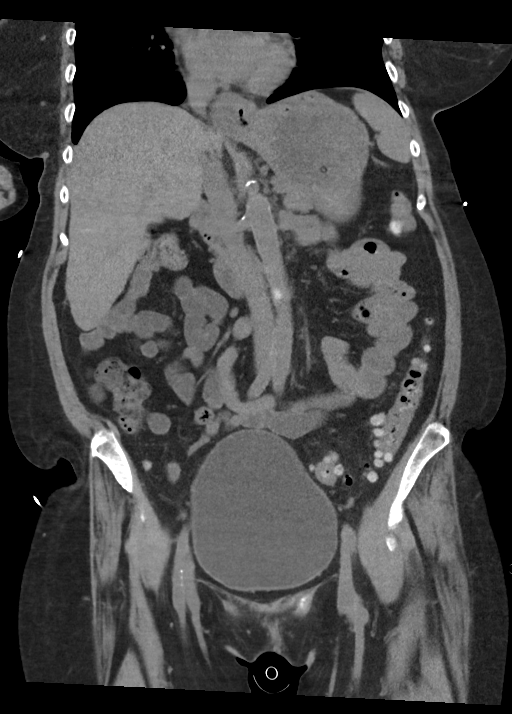
[im 49/88  soft-tissue]
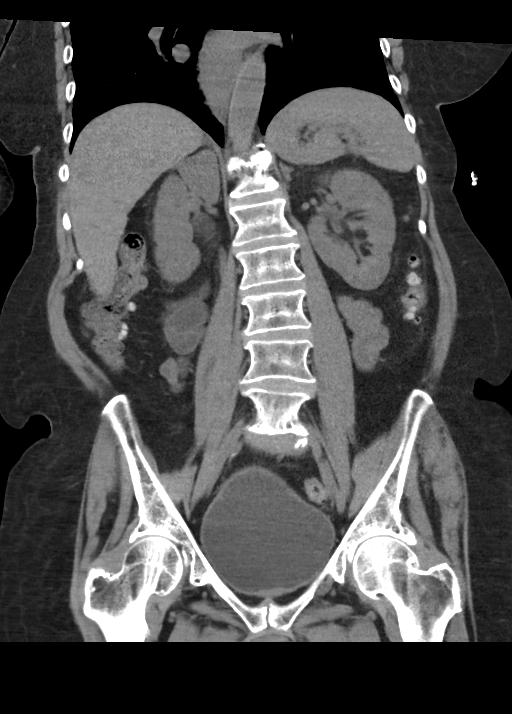

[15 of 46 positions shown; findings below may reference images not displayed]

FINDINGS: Evaluation of this exam is limited in the absence of intravenous
contrast.

Lower chest: The visualized lung bases are clear.

No intra-abdominal free air or free fluid.

Hepatobiliary: There is irregularity of the liver contour consistent
with cirrhosis. No intrahepatic biliary dilatation. The gallbladder
is unremarkable.

Pancreas: Unremarkable. No pancreatic ductal dilatation or
surrounding inflammatory changes.

Spleen: Normal in size without focal abnormality.

Adrenals/Urinary Tract: The adrenal glands unremarkable. The
kidneys, visualized ureters, and edema bladder appear unremarkable.

Stomach/Bowel: There is sigmoid diverticulosis and scattered colonic
diverticula without active inflammatory changes. There is a small
hiatal hernia. There is no bowel obstruction or active inflammation.
The appendix is normal.

Vascular/Lymphatic: Mild atherosclerotic calcification of the aorta.
The IVC is unremarkable. No portal venous gas. There is no
adenopathy.

Reproductive: Hysterectomy.  No adnexal masses.

Other: None

Musculoskeletal: Degenerative changes of the spine and left hip. No
acute osseous pathology.
IMPRESSION: 1. No acute intra-abdominal or pelvic pathology.
2. Colonic diverticulosis. No bowel obstruction. Normal appendix.
3. Cirrhosis.
4. Aortic Atherosclerosis ([id number]-[id number]).

## 2023-12-24 IMAGING — DX DG CHEST 1V PORT
1 series · 1 of 1 positions shown · non-contrast
Comparison: Chest radiograph dated 05/28/2021.

CLINICAL DATA: Chest pain.

EXAM:
PORTABLE CHEST 1 VIEW

[chest ap]
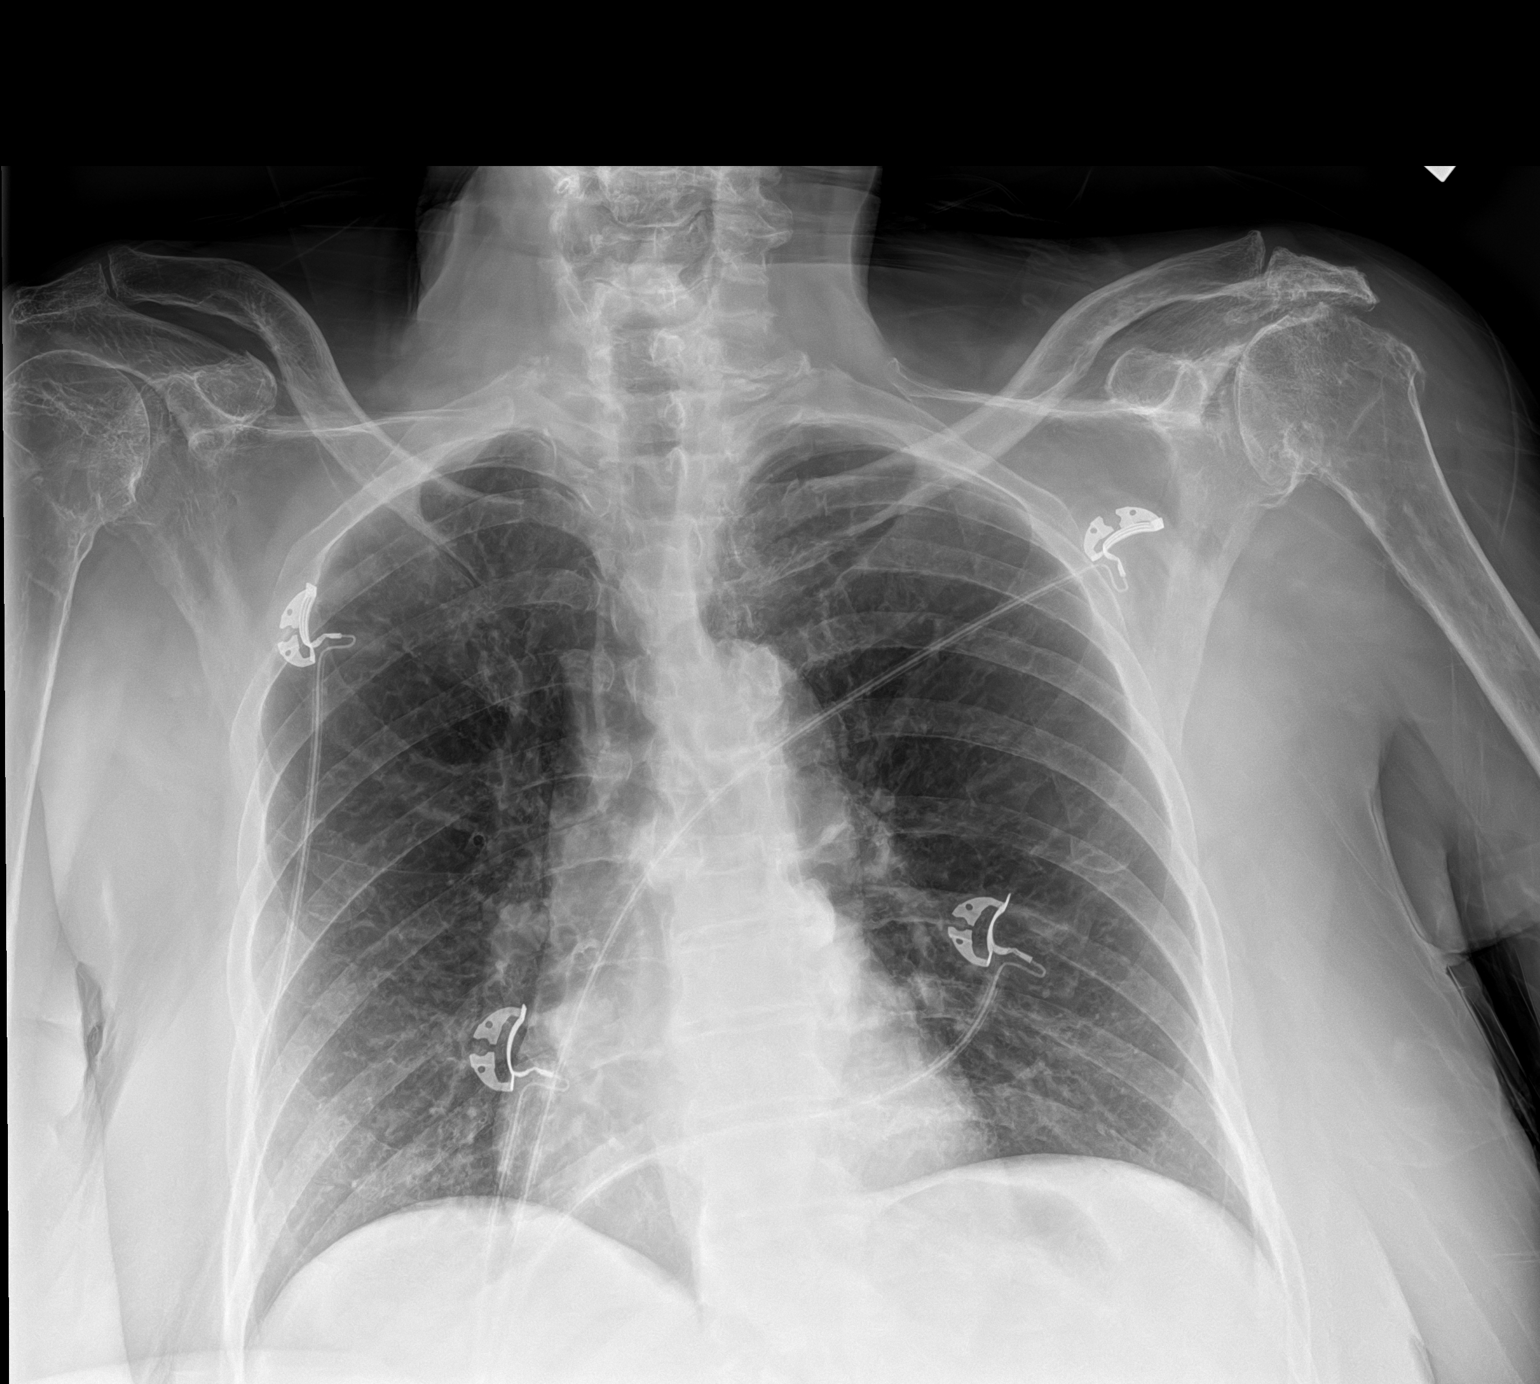

[1 of 1 positions shown; findings below may reference images not displayed]

FINDINGS: The heart size and mediastinal contours are within normal limits.
Both lungs are clear. The visualized skeletal structures are
unremarkable.
IMPRESSION: No active disease.

## 2024-03-28 ENCOUNTER — Ambulatory Visit (INDEPENDENT_AMBULATORY_CARE_PROVIDER_SITE_OTHER): Admitting: Orthopaedic Surgery

## 2024-03-28 ENCOUNTER — Encounter: Payer: Self-pay | Admitting: Orthopaedic Surgery

## 2024-03-28 DIAGNOSIS — R531 Weakness: Secondary | ICD-10-CM

## 2024-03-28 DIAGNOSIS — M25561 Pain in right knee: Secondary | ICD-10-CM | POA: Diagnosis not present

## 2024-03-28 DIAGNOSIS — G8929 Other chronic pain: Secondary | ICD-10-CM | POA: Diagnosis not present

## 2024-03-28 MED ORDER — HYDROCODONE-ACETAMINOPHEN 5-325 MG PO TABS: ORAL_TABLET | ORAL | 0 refills | Status: DC

## 2024-03-28 MED ORDER — METHYLPREDNISOLONE ACETATE 40 MG/ML IJ SUSP
40.0000 mg | Freq: Once | INTRAMUSCULAR | Status: AC
Start: 2024-03-28 — End: 2024-03-28
  Administered 2024-03-28: 40 mg via INTRA_ARTICULAR

## 2024-03-28 NOTE — Progress Notes (Signed)
 PROCEDURE NOTE:  The patient requests injections of the right knee , verbal consent was obtained.  The right knee was prepped appropriately after time out was performed.   Sterile technique was observed and injection of 1 cc of DepoMedrol 40mg  with several cc's of plain xylocaine . Anesthesia was provided by ethyl chloride and a 20-gauge needle was used to inject the knee area. The injection was tolerated well.  A band aid dressing was applied.  The patient was advised to apply ice later today and tomorrow to the injection sight as needed.  Encounter Diagnoses  Name Primary?   Chronic pain of right knee Yes   Right sided weakness    Return in two months.

## 2024-03-28 NOTE — Addendum Note (Signed)
 Addended by: [place] Snow T on: 03/28/2024 03:31 PM   Modules accepted: Orders

## 2024-04-24 ENCOUNTER — Telehealth: Payer: Self-pay | Admitting: Orthopaedic Surgery

## 2024-04-24 NOTE — Telephone Encounter (Signed)
 Dr. Janae pt - pt's sister Dickey Saba [phone] lvm stating that at the pt's last visit that Dr. Janae assistant was going to contact they pharmacy and they've not heard anything.  She would like a call back.

## 2024-04-26 ENCOUNTER — Telehealth: Payer: Self-pay | Admitting: Orthopaedic Surgery

## 2024-04-26 NOTE — Telephone Encounter (Signed)
 Dr. Janae pt - Dickey Saba lvm for the pt requesting a refill for Hydrocodone  5-325.

## 2024-04-26 NOTE — Telephone Encounter (Signed)
 Left vm to return my call

## 2024-04-27 MED ORDER — HYDROCODONE-ACETAMINOPHEN 5-325 MG PO TABS: ORAL_TABLET | ORAL | 0 refills | Status: DC

## 2024-05-30 ENCOUNTER — Ambulatory Visit: Admitting: Orthopaedic Surgery

## 2024-06-20 ENCOUNTER — Ambulatory Visit: Admitting: Orthopaedic Surgery

## 2024-07-12 ENCOUNTER — Encounter: Payer: Self-pay | Admitting: Orthopaedic Surgery

## 2024-07-12 ENCOUNTER — Ambulatory Visit (INDEPENDENT_AMBULATORY_CARE_PROVIDER_SITE_OTHER): Admitting: Orthopaedic Surgery

## 2024-07-12 DIAGNOSIS — R531 Weakness: Secondary | ICD-10-CM

## 2024-07-12 DIAGNOSIS — M25561 Pain in right knee: Secondary | ICD-10-CM | POA: Diagnosis not present

## 2024-07-12 DIAGNOSIS — G8929 Other chronic pain: Secondary | ICD-10-CM

## 2024-07-12 MED ORDER — METHYLPREDNISOLONE ACETATE 40 MG/ML IJ SUSP
40.0000 mg | Freq: Once | INTRAMUSCULAR | Status: AC
Start: 2024-07-12 — End: 2024-07-12
  Administered 2024-07-12: 40 mg via INTRA_ARTICULAR

## 2024-07-12 MED ORDER — METHYLPREDNISOLONE ACETATE 40 MG/ML IJ SUSP: 40.0000 mg | Freq: Once | INTRAMUSCULAR | Status: DC

## 2024-07-12 MED ORDER — HYDROCODONE-ACETAMINOPHEN 5-325 MG PO TABS: ORAL_TABLET | ORAL | 0 refills | Status: DC

## 2024-07-12 NOTE — Addendum Note (Signed)
 Addended by: MARCINE HUSBAND T on: 07/12/2024 03:21 PM   Modules accepted: Orders

## 2024-07-12 NOTE — Progress Notes (Signed)
 PROCEDURE NOTE:  The patient requests injections of the right knee , verbal consent was obtained.  The right knee was prepped appropriately after time out was performed.   Sterile technique was observed and injection of 1 cc of DepoMedrol 40mg  with several cc's of plain xylocaine . Anesthesia was provided by ethyl chloride and a 20-gauge needle was used to inject the knee area. The injection was tolerated well.  A band aid dressing was applied.  The patient was advised to apply ice later today and tomorrow to the injection sight as needed.  Encounter Diagnoses  Name Primary?   Chronic pain of right knee Yes   Right sided weakness    I have reviewed the [place]  Controlled Substance Reporting System web site prior to prescribing narcotic medicine for this patient.  Return in two months.  Call if any problem.  Precautions discussed.  Electronically Signed Lemond Stable, MD 9/25/20253:13 PM

## 2024-08-07 ENCOUNTER — Encounter (HOSPITAL_COMMUNITY): Payer: Self-pay | Admitting: Emergency Medicine

## 2024-08-07 ENCOUNTER — Emergency Department (HOSPITAL_COMMUNITY)
Admission: EM | Admit: 2024-08-07 | Discharge: 2024-08-08 | Disposition: A | Discharge status: Home / Self Care | Attending: Emergency Medicine | Admitting: Emergency Medicine

## 2024-08-07 ENCOUNTER — Other Ambulatory Visit: Payer: Self-pay

## 2024-08-07 DIAGNOSIS — R519 Headache, unspecified: Secondary | ICD-10-CM | POA: Insufficient documentation

## 2024-08-07 DIAGNOSIS — R109 Unspecified abdominal pain: Secondary | ICD-10-CM | POA: Insufficient documentation

## 2024-08-07 DIAGNOSIS — J45909 Unspecified asthma, uncomplicated: Secondary | ICD-10-CM | POA: Insufficient documentation

## 2024-08-07 DIAGNOSIS — W050XXA Fall from non-moving wheelchair, initial encounter: Secondary | ICD-10-CM | POA: Diagnosis not present

## 2024-08-07 DIAGNOSIS — I10 Essential (primary) hypertension: Secondary | ICD-10-CM | POA: Insufficient documentation

## 2024-08-07 DIAGNOSIS — Z79899 Other long term (current) drug therapy: Secondary | ICD-10-CM | POA: Diagnosis not present

## 2024-08-07 DIAGNOSIS — W19XXXA Unspecified fall, initial encounter: Secondary | ICD-10-CM

## 2024-08-07 MED ORDER — OXYCODONE-ACETAMINOPHEN 5-325 MG PO TABS
1.0000 | ORAL_TABLET | Freq: Once | ORAL | Status: AC
  Administered 2024-08-08: 1 via ORAL
  Filled 2024-08-07: qty 1

## 2024-08-07 NOTE — ED Triage Notes (Signed)
 Pt BIB [facility] from home after pt had mechanical fall tonight at home. Pt was assisted to floor by husband. Pt then began to c/o right flank pain.

## 2024-08-07 NOTE — ED Provider Notes (Signed)
 [place] EMERGENCY DEPARTMENT AT [facility] Provider Note   CSN: [id number] Arrival date & time: 08/07/24  2203     Patient presents with: [name] is a 68 y.o. female.  {Add pertinent medical, surgical, social history, OB history to YEP:67052}  Fall Associated symptoms include abdominal pain and headaches.  Patient presents after fall.  Medical history includes CVA with residual right hemibody deficits, seizures, PUD, HTN, GERD, asthma.  This evening, at around 9 PM, patient had a mechanical fall at home.  This was witnessed by family.  She is typically in a wheelchair.  Wheelchair does elevate for help with transfer.  Patient fell out of the wheelchair and landed on her right side.  Her son was able to partially catch her and she fell.  He denies any significant impact to her head.  Patient has since complained of right flank pain.  Currently, she endorses ongoing right flank pain in addition to a bifrontal headache.  Family confirms the patient is at neurologic baseline, which includes right hemibody weakness and aphasia.       Prior to Admission medications   Medication Sig Start Date End Date Taking? Authorizing Provider  acetaminophen  (TYLENOL ) 500 MG tablet Take 500 mg by mouth every 6 (six) hours as needed. As needed for pain    [provider]  albuterol  (VENTOLIN  HFA) 108 (90 Base) MCG/ACT inhaler Inhale 2 puffs into the lungs every 6 (six) hours as needed for wheezing or shortness of breath. 08/06/22   Pearlean Manus, MD  HYDROcodone -acetaminophen  (NORCO/VICODIN) 5-325 MG tablet One tablet every six hours for pain.  Limit 7 days. 07/12/24   Brenna Lin, MD  lisinopril  (ZESTRIL ) 20 MG tablet Take 1 tablet (20 mg total) by mouth daily. 01/09/23   Vann, Jessica U, DO  pantoprazole  (PROTONIX ) 40 MG tablet Take 1 tablet (40 mg total) by mouth daily for 7 days. 10/09/22 09/07/23  Mesner, Selinda, MD  PHENobarbital  (LUMINAL) 32.4 MG tablet Take 1  tablet (32.4 mg total) by mouth 2 (two) times daily. 08/06/22   Pearlean Manus, MD  phenytoin  (DILANTIN ) 200 MG ER capsule Take 1 capsule (200 mg total) by mouth 2 (two) times daily. Patient taking differently: Take 100 mg by mouth 3 (three) times daily. 08/06/22   Pearlean Manus, MD  potassium chloride  (KLOR-CON ) 20 MEQ packet Take 20 mEq by mouth 2 (two) times daily. 05/30/23   Raford Lenis, MD  solifenacin (VESICARE) 5 MG tablet Take 5 mg by mouth daily. 11/24/22   [provider]    Allergies: Patient has no known allergies.    Review of Systems  Gastrointestinal:  Positive for abdominal pain.  Genitourinary:  Positive for flank pain.  Neurological:  Positive for headaches.  All other systems reviewed and are negative.   Updated Vital Signs BP 122/67 (BP Location: Left Arm)   Pulse 91   Temp 97.9 F (36.6 C) (Oral)   Resp 16   Ht 5' 3 (1.6 m)   Wt 64 kg   SpO2 100%   BMI 24.99 kg/m   Physical Exam Vitals and nursing note reviewed.  Constitutional:      General: She is not in acute distress.    Appearance: Normal appearance. She is well-developed. She is not ill-appearing, toxic-appearing or diaphoretic.  HENT:     Head: Normocephalic and atraumatic.     Right Ear: External ear normal.     Left Ear: External ear normal.  Nose: Nose normal.     Mouth/Throat:     Mouth: Mucous membranes are moist.  Eyes:     Extraocular Movements: Extraocular movements intact.     Conjunctiva/sclera: Conjunctivae normal.  Cardiovascular:     Rate and Rhythm: Normal rate and regular rhythm.  Pulmonary:     Effort: Pulmonary effort is normal. No respiratory distress.  Chest:     Chest wall: No tenderness.  Abdominal:     Palpations: Abdomen is soft.     Tenderness: There is abdominal tenderness. There is no guarding or rebound.  Musculoskeletal:        General: No swelling.     Cervical back: Normal range of motion and neck supple.  Skin:    General: Skin is warm  and dry.     Coloration: Skin is not jaundiced or pale.  Neurological:     Mental Status: She is alert. Mental status is at baseline.     Comments: Baseline right hemibody deficits and aphasia  Psychiatric:        Mood and Affect: Mood normal.        Behavior: Behavior normal.     (all labs ordered are listed, but only abnormal results are displayed) Labs Reviewed - No data to display  EKG: None  Radiology: No results found.  {Document cardiac monitor, telemetry assessment procedure when appropriate:32947} Procedures   Medications Ordered in the ED - No data to display    {Click here for ABCD2, HEART and other calculators REFRESH Note before signing:1}                              Medical Decision Making  This patient presents to the ED for concern of ***, this involves an extensive number of treatment options, and is a complaint that carries with it a high risk of complications and morbidity.  The differential diagnosis includes ***   Co morbidities / Chronic conditions that complicate the patient evaluation  ***   Additional history obtained:  Additional history obtained from EMR External records from outside source obtained and reviewed including ***   Lab Tests:  I Ordered, and personally interpreted labs.  The pertinent results include:  ***   Imaging Studies ordered:  I ordered imaging studies including ***  I independently visualized and interpreted imaging which showed *** I agree with the radiologist interpretation   Cardiac Monitoring: / EKG:  The patient was maintained on a cardiac monitor.  I personally viewed and interpreted the cardiac monitored which showed an underlying rhythm of: ***   Problem List / ED Course / Critical interventions / Medication management  Patient presenting after mechanical fall at home tonight.  She has since had right flank pain.  Patient is aphasic at baseline but is able to point to her right side of abdomen as  location of her pain in addition to her forehead area.  She has tenderness without guarding to her abdomen.  Percocet was ordered for analgesia.  Workup was initiated.*** I ordered medication including ***   Reevaluation of the patient after these medicines showed that the patient *** I have reviewed the patients home medicines and have made adjustments as needed   Consultations Obtained:  I requested consultation with the ***,  and discussed lab and imaging findings as well as pertinent plan - they recommend: ***   Social Determinants of Health:  ***   Test / Admission - Considered:  ***   {  Document critical care time when appropriate  Document review of labs and clinical decision tools ie CHADS2VASC2, etc  Document your independent review of radiology images and any outside records  Document your discussion with family members, caretakers and with consultants  Document social determinants of health affecting pt's care  Document your decision making why or why not admission, treatments were needed:32947:::1}   Final diagnoses:  None    ED Discharge Orders     None

## 2024-08-08 ENCOUNTER — Emergency Department (HOSPITAL_COMMUNITY)

## 2024-08-08 DIAGNOSIS — R109 Unspecified abdominal pain: Secondary | ICD-10-CM | POA: Diagnosis not present

## 2024-08-08 LAB — CBC
HCT: 34.4 % — ABNORMAL LOW (ref 36.0–46.0)
Hemoglobin: 11.5 g/dL — ABNORMAL LOW (ref 12.0–15.0)
MCH: 29.9 pg (ref 26.0–34.0)
MCHC: 33.4 g/dL (ref 30.0–36.0)
MCV: 89.6 fL (ref 80.0–100.0)
Platelets: 99 K/uL — ABNORMAL LOW (ref 150–400)
RBC: 3.84 MIL/uL — ABNORMAL LOW (ref 3.87–5.11)
RDW: 14.8 % (ref 11.5–15.5)
WBC: 5.1 K/uL (ref 4.0–10.5)
nRBC: 0 % (ref 0.0–0.2)

## 2024-08-08 LAB — COMPREHENSIVE METABOLIC PANEL WITH GFR
ALT: 25 U/L (ref 0–44)
AST: 67 U/L — ABNORMAL HIGH (ref 15–41)
Albumin: 4 g/dL (ref 3.5–5.0)
Alkaline Phosphatase: 143 U/L — ABNORMAL HIGH (ref 38–126)
Anion gap: 14 (ref 5–15)
BUN: 6 mg/dL — ABNORMAL LOW (ref 8–23)
CO2: 25 mmol/L (ref 22–32)
Calcium: 8.7 mg/dL — ABNORMAL LOW (ref 8.9–10.3)
Chloride: 97 mmol/L — ABNORMAL LOW (ref 98–111)
Creatinine, Ser: 0.58 mg/dL (ref 0.44–1.00)
GFR, Estimated: >60 mL/min (ref >60)
Glucose, Bld: 99 mg/dL (ref 70–99)
Potassium: 3.6 mmol/L (ref 3.5–5.1)
Sodium: 136 mmol/L (ref 135–145)
Total Bilirubin: 0.4 mg/dL (ref 0.0–1.2)
Total Protein: 7.6 g/dL (ref 6.5–8.1)

## 2024-08-08 LAB — MAGNESIUM: Magnesium: 2.2 mg/dL (ref 1.7–2.4)

## 2024-08-08 NOTE — ED Notes (Signed)
 This RN was able to speak with pts sister on emergency contact list, Zettie Fetters, pertaining to pts discharge and needing a ride, Mrs. Fetters states let me call her aide to see if she can pick her up and I will call you back

## 2024-08-08 NOTE — ED Notes (Signed)
 Called all of patients contacts to have someone come and pick her up and no one answered, will try again.

## 2024-08-08 NOTE — ED Notes (Signed)
 Spoke with Mrs. Booker, pts sister and POA, she states her aide will be there to get her in 30-45 minutes

## 2024-08-08 NOTE — Discharge Instructions (Signed)
 Your test results today are reassuring.  Your imaging studies did not show any major injuries.  Take ibuprofen and Tylenol  as needed for pain.  Return to the emergency department for any new or worsening symptoms of concern.

## 2024-10-11 ENCOUNTER — Emergency Department (HOSPITAL_COMMUNITY)

## 2024-10-11 ENCOUNTER — Encounter (HOSPITAL_COMMUNITY): Payer: Self-pay

## 2024-10-11 ENCOUNTER — Other Ambulatory Visit: Payer: Self-pay

## 2024-10-11 ENCOUNTER — Inpatient Hospital Stay (HOSPITAL_COMMUNITY)
Admission: EM | Admit: 2024-10-11 | Discharge: 2024-10-19 | DRG: 896 | Disposition: A | Discharge status: Skilled Nursing Facility | Attending: Internal Medicine | Admitting: Internal Medicine

## 2024-10-11 DIAGNOSIS — E872 Acidosis, unspecified: Secondary | ICD-10-CM | POA: Diagnosis present

## 2024-10-11 DIAGNOSIS — J45909 Unspecified asthma, uncomplicated: Secondary | ICD-10-CM | POA: Diagnosis present

## 2024-10-11 DIAGNOSIS — R571 Hypovolemic shock: Secondary | ICD-10-CM | POA: Diagnosis present

## 2024-10-11 DIAGNOSIS — R5381 Other malaise: Secondary | ICD-10-CM | POA: Diagnosis present

## 2024-10-11 DIAGNOSIS — G40909 Epilepsy, unspecified, not intractable, without status epilepticus: Secondary | ICD-10-CM | POA: Diagnosis present

## 2024-10-11 DIAGNOSIS — Z1152 Encounter for screening for COVID-19: Secondary | ICD-10-CM | POA: Diagnosis not present

## 2024-10-11 DIAGNOSIS — E861 Hypovolemia: Secondary | ICD-10-CM | POA: Diagnosis present

## 2024-10-11 DIAGNOSIS — E440 Moderate protein-calorie malnutrition: Secondary | ICD-10-CM | POA: Diagnosis present

## 2024-10-11 DIAGNOSIS — D649 Anemia, unspecified: Secondary | ICD-10-CM | POA: Diagnosis not present

## 2024-10-11 DIAGNOSIS — D62 Acute posthemorrhagic anemia: Secondary | ICD-10-CM | POA: Diagnosis present

## 2024-10-11 DIAGNOSIS — E870 Hyperosmolality and hypernatremia: Secondary | ICD-10-CM | POA: Diagnosis present

## 2024-10-11 DIAGNOSIS — K922 Gastrointestinal hemorrhage, unspecified: Secondary | ICD-10-CM | POA: Diagnosis not present

## 2024-10-11 DIAGNOSIS — E876 Hypokalemia: Secondary | ICD-10-CM | POA: Diagnosis not present

## 2024-10-11 DIAGNOSIS — E871 Hypo-osmolality and hyponatremia: Secondary | ICD-10-CM | POA: Diagnosis present

## 2024-10-11 DIAGNOSIS — N179 Acute kidney failure, unspecified: Secondary | ICD-10-CM | POA: Diagnosis present

## 2024-10-11 DIAGNOSIS — G9341 Metabolic encephalopathy: Secondary | ICD-10-CM | POA: Diagnosis present

## 2024-10-11 DIAGNOSIS — R6510 Systemic inflammatory response syndrome (SIRS) of non-infectious origin without acute organ dysfunction: Secondary | ICD-10-CM | POA: Diagnosis present

## 2024-10-11 DIAGNOSIS — F10231 Alcohol dependence with withdrawal delirium: Principal | ICD-10-CM | POA: Diagnosis present

## 2024-10-11 DIAGNOSIS — K7682 Hepatic encephalopathy: Secondary | ICD-10-CM | POA: Diagnosis present

## 2024-10-11 DIAGNOSIS — Y906 Blood alcohol level of 120-199 mg/100 ml: Secondary | ICD-10-CM | POA: Diagnosis present

## 2024-10-11 DIAGNOSIS — K254 Chronic or unspecified gastric ulcer with hemorrhage: Secondary | ICD-10-CM | POA: Diagnosis present

## 2024-10-11 DIAGNOSIS — Z860100 Personal history of colon polyps, unspecified: Secondary | ICD-10-CM

## 2024-10-11 DIAGNOSIS — Z6823 Body mass index (BMI) 23.0-23.9, adult: Secondary | ICD-10-CM

## 2024-10-11 DIAGNOSIS — K920 Hematemesis: Secondary | ICD-10-CM | POA: Diagnosis not present

## 2024-10-11 DIAGNOSIS — K766 Portal hypertension: Secondary | ICD-10-CM | POA: Diagnosis present

## 2024-10-11 DIAGNOSIS — K2211 Ulcer of esophagus with bleeding: Secondary | ICD-10-CM | POA: Diagnosis present

## 2024-10-11 DIAGNOSIS — K580 Irritable bowel syndrome with diarrhea: Secondary | ICD-10-CM | POA: Diagnosis present

## 2024-10-11 DIAGNOSIS — Z87891 Personal history of nicotine dependence: Secondary | ICD-10-CM

## 2024-10-11 DIAGNOSIS — E8809 Other disorders of plasma-protein metabolism, not elsewhere classified: Secondary | ICD-10-CM | POA: Diagnosis present

## 2024-10-11 DIAGNOSIS — I10 Essential (primary) hypertension: Secondary | ICD-10-CM | POA: Diagnosis present

## 2024-10-11 DIAGNOSIS — K7030 Alcoholic cirrhosis of liver without ascites: Secondary | ICD-10-CM | POA: Diagnosis not present

## 2024-10-11 DIAGNOSIS — F10939 Alcohol use, unspecified with withdrawal, unspecified: Secondary | ICD-10-CM | POA: Insufficient documentation

## 2024-10-11 DIAGNOSIS — E46 Unspecified protein-calorie malnutrition: Secondary | ICD-10-CM | POA: Diagnosis not present

## 2024-10-11 DIAGNOSIS — D696 Thrombocytopenia, unspecified: Secondary | ICD-10-CM | POA: Diagnosis present

## 2024-10-11 DIAGNOSIS — F10931 Alcohol use, unspecified with withdrawal delirium: Secondary | ICD-10-CM | POA: Diagnosis not present

## 2024-10-11 DIAGNOSIS — R578 Other shock: Secondary | ICD-10-CM | POA: Diagnosis present

## 2024-10-11 DIAGNOSIS — R7401 Elevation of levels of liver transaminase levels: Secondary | ICD-10-CM | POA: Diagnosis not present

## 2024-10-11 DIAGNOSIS — F1010 Alcohol abuse, uncomplicated: Secondary | ICD-10-CM | POA: Diagnosis not present

## 2024-10-11 DIAGNOSIS — R569 Unspecified convulsions: Secondary | ICD-10-CM | POA: Diagnosis not present

## 2024-10-11 DIAGNOSIS — K2100 Gastro-esophageal reflux disease with esophagitis, without bleeding: Secondary | ICD-10-CM | POA: Diagnosis not present

## 2024-10-11 DIAGNOSIS — G8929 Other chronic pain: Secondary | ICD-10-CM | POA: Diagnosis present

## 2024-10-11 DIAGNOSIS — K7010 Alcoholic hepatitis without ascites: Secondary | ICD-10-CM | POA: Diagnosis present

## 2024-10-11 DIAGNOSIS — K7031 Alcoholic cirrhosis of liver with ascites: Secondary | ICD-10-CM | POA: Diagnosis not present

## 2024-10-11 DIAGNOSIS — Z8673 Personal history of transient ischemic attack (TIA), and cerebral infarction without residual deficits: Secondary | ICD-10-CM

## 2024-10-11 DIAGNOSIS — F10221 Alcohol dependence with intoxication delirium: Principal | ICD-10-CM | POA: Diagnosis present

## 2024-10-11 DIAGNOSIS — Z79899 Other long term (current) drug therapy: Secondary | ICD-10-CM

## 2024-10-11 DIAGNOSIS — G9389 Other specified disorders of brain: Secondary | ICD-10-CM | POA: Diagnosis present

## 2024-10-11 DIAGNOSIS — R933 Abnormal findings on diagnostic imaging of other parts of digestive tract: Secondary | ICD-10-CM | POA: Diagnosis not present

## 2024-10-11 DIAGNOSIS — K449 Diaphragmatic hernia without obstruction or gangrene: Secondary | ICD-10-CM | POA: Diagnosis present

## 2024-10-11 DIAGNOSIS — R000 Tachycardia, unspecified: Secondary | ICD-10-CM | POA: Diagnosis present

## 2024-10-11 DIAGNOSIS — I8501 Esophageal varices with bleeding: Secondary | ICD-10-CM | POA: Diagnosis not present

## 2024-10-11 DIAGNOSIS — K2210 Ulcer of esophagus without bleeding: Secondary | ICD-10-CM | POA: Diagnosis not present

## 2024-10-11 DIAGNOSIS — D689 Coagulation defect, unspecified: Secondary | ICD-10-CM | POA: Diagnosis not present

## 2024-10-11 DIAGNOSIS — F10139 Alcohol abuse with withdrawal, unspecified: Secondary | ICD-10-CM | POA: Diagnosis not present

## 2024-10-11 LAB — CBC WITH DIFFERENTIAL/PLATELET
Abs Immature Granulocytes: 0.01 K/uL (ref 0.00–0.07)
Basophils Absolute: 0 K/uL (ref 0.0–0.1)
Basophils Relative: 0 %
Eosinophils Absolute: 0 K/uL (ref 0.0–0.5)
Eosinophils Relative: 0 %
HCT: 33.8 % — ABNORMAL LOW (ref 36.0–46.0)
Hemoglobin: 11.7 g/dL — ABNORMAL LOW (ref 12.0–15.0)
Immature Granulocytes: 0 %
Lymphocytes Relative: 10 %
Lymphs Abs: 0.7 K/uL (ref 0.7–4.0)
MCH: 29.8 pg (ref 26.0–34.0)
MCHC: 34.6 g/dL (ref 30.0–36.0)
MCV: 86 fL (ref 80.0–100.0)
Monocytes Absolute: 0.4 K/uL (ref 0.1–1.0)
Monocytes Relative: 6 %
Neutro Abs: 5.7 K/uL (ref 1.7–7.7)
Neutrophils Relative %: 84 %
Platelets: 216 K/uL (ref 150–400)
RBC: 3.93 MIL/uL (ref 3.87–5.11)
RDW: 14.9 % (ref 11.5–15.5)
WBC: 6.8 K/uL (ref 4.0–10.5)
nRBC: 0 % (ref 0.0–0.2)

## 2024-10-11 LAB — URINALYSIS, W/ REFLEX TO CULTURE (INFECTION SUSPECTED)
Bacteria, UA: NONE SEEN
Bilirubin Urine: NEGATIVE
Glucose, UA: NEGATIVE mg/dL
Ketones, ur: NEGATIVE mg/dL
Leukocytes,Ua: NEGATIVE
Nitrite: NEGATIVE
Protein, ur: NEGATIVE mg/dL
Specific Gravity, Urine: 1.018 (ref 1.005–1.030)
pH: 5 (ref 5.0–8.0)

## 2024-10-11 LAB — CBC
HCT: 27.1 % — ABNORMAL LOW (ref 36.0–46.0)
Hemoglobin: 9.3 g/dL — ABNORMAL LOW (ref 12.0–15.0)
MCH: 30.8 pg (ref 26.0–34.0)
MCHC: 34.3 g/dL (ref 30.0–36.0)
MCV: 89.7 fL (ref 80.0–100.0)
Platelets: 129 K/uL — ABNORMAL LOW (ref 150–400)
RBC: 3.02 MIL/uL — ABNORMAL LOW (ref 3.87–5.11)
RDW: 15.3 % (ref 11.5–15.5)
WBC: 10.4 K/uL (ref 4.0–10.5)
nRBC: 0 % (ref 0.0–0.2)

## 2024-10-11 LAB — URINE DRUG SCREEN
Amphetamines: NEGATIVE
Barbiturates: NEGATIVE
Benzodiazepines: NEGATIVE
Cocaine: NEGATIVE
Fentanyl: NEGATIVE
Methadone Scn, Ur: NEGATIVE
Opiates: NEGATIVE
Tetrahydrocannabinol: NEGATIVE

## 2024-10-11 LAB — RESP PANEL BY RT-PCR (RSV, FLU A&B, COVID)  RVPGX2
Influenza A by PCR: NEGATIVE
Influenza B by PCR: NEGATIVE
Resp Syncytial Virus by PCR: NEGATIVE
SARS Coronavirus 2 by RT PCR: NEGATIVE

## 2024-10-11 LAB — CBG MONITORING, ED: Glucose-Capillary: 173 mg/dL — ABNORMAL HIGH (ref 70–99)

## 2024-10-11 LAB — COMPREHENSIVE METABOLIC PANEL WITH GFR
ALT: 35 U/L (ref 0–44)
AST: 94 U/L — ABNORMAL HIGH (ref 15–41)
Albumin: 4 g/dL (ref 3.5–5.0)
Alkaline Phosphatase: 123 U/L (ref 38–126)
Anion gap: 29 — ABNORMAL HIGH (ref 5–15)
BUN: 13 mg/dL (ref 8–23)
CO2: 26 mmol/L (ref 22–32)
Calcium: 9.3 mg/dL (ref 8.9–10.3)
Chloride: 80 mmol/L — ABNORMAL LOW (ref 98–111)
Creatinine, Ser: 1.33 mg/dL — ABNORMAL HIGH (ref 0.44–1.00)
GFR, Estimated: 44 mL/min — ABNORMAL LOW
Glucose, Bld: 157 mg/dL — ABNORMAL HIGH (ref 70–99)
Potassium: 2.4 mmol/L — CL (ref 3.5–5.1)
Sodium: 134 mmol/L — ABNORMAL LOW (ref 135–145)
Total Bilirubin: 0.9 mg/dL (ref 0.0–1.2)
Total Protein: 7.7 g/dL (ref 6.5–8.1)

## 2024-10-11 LAB — LACTIC ACID, PLASMA
Lactic Acid, Venous: >9 mmol/L (ref 0.5–1.9)
Lactic Acid, Venous: >9 mmol/L (ref 0.5–1.9)
Lactic Acid, Venous: 7 mmol/L (ref 0.5–1.9)

## 2024-10-11 LAB — FOLATE: Folate: 6.7 ng/mL

## 2024-10-11 LAB — ETHANOL: Alcohol, Ethyl (B): 180 mg/dL — ABNORMAL HIGH

## 2024-10-11 LAB — TYPE AND SCREEN
ABO/RH(D): O POS
Antibody Screen: NEGATIVE

## 2024-10-11 LAB — HEMOGLOBIN A1C
Hgb A1c MFr Bld: 4.9 % (ref 4.8–5.6)
Mean Plasma Glucose: 93.93 mg/dL

## 2024-10-11 LAB — PHENYTOIN LEVEL, TOTAL: Phenytoin Lvl: <2.5 ug/mL — ABNORMAL LOW (ref 10.0–20.0)

## 2024-10-11 LAB — MRSA NEXT GEN BY PCR, NASAL: MRSA by PCR Next Gen: NOT DETECTED

## 2024-10-11 LAB — LIPASE, BLOOD: Lipase: 44 U/L (ref 11–51)

## 2024-10-11 LAB — TROPONIN T, HIGH SENSITIVITY: Troponin T High Sensitivity: 38 ng/L — ABNORMAL HIGH (ref 0–19)

## 2024-10-11 LAB — TSH: TSH: 0.48 u[IU]/mL (ref 0.350–4.500)

## 2024-10-11 LAB — VITAMIN B12: Vitamin B-12: 1612 pg/mL — ABNORMAL HIGH (ref 180–914)

## 2024-10-11 LAB — PROTIME-INR
INR: 1.2 (ref 0.8–1.2)
Prothrombin Time: 15.4 s — ABNORMAL HIGH (ref 11.4–15.2)

## 2024-10-11 LAB — T4, FREE: Free T4: 1.32 ng/dL (ref 0.80–2.00)

## 2024-10-11 LAB — POTASSIUM: Potassium: 3.9 mmol/L (ref 3.5–5.1)

## 2024-10-11 LAB — MAGNESIUM: Magnesium: 1.2 mg/dL — ABNORMAL LOW (ref 1.7–2.4)

## 2024-10-11 MED ORDER — LACTATED RINGERS IV BOLUS
1000.0000 mL | Freq: Once | INTRAVENOUS | Status: AC
  Administered 2024-10-11: 1000 mL via INTRAVENOUS

## 2024-10-11 MED ORDER — METRONIDAZOLE 500 MG/100ML IV SOLN
500.0000 mg | Freq: Once | INTRAVENOUS | Status: AC
  Administered 2024-10-11: 500 mg via INTRAVENOUS
  Filled 2024-10-11: qty 100

## 2024-10-11 MED ORDER — LACTATED RINGERS IV BOLUS
500.0000 mL | Freq: Once | INTRAVENOUS | Status: AC
  Administered 2024-10-11: 500 mL via INTRAVENOUS

## 2024-10-11 MED ORDER — THIAMINE MONONITRATE 100 MG PO TABS
100.0000 mg | ORAL_TABLET | Freq: Every day | ORAL | Status: DC
  Administered 2024-10-12: 100 mg via ORAL
  Filled 2024-10-11: qty 1

## 2024-10-11 MED ORDER — SODIUM CHLORIDE 0.9 % IV SOLN
2.0000 g | Freq: Once | INTRAVENOUS | Status: AC
  Administered 2024-10-11: 2 g via INTRAVENOUS
  Filled 2024-10-11: qty 20

## 2024-10-11 MED ORDER — OCTREOTIDE LOAD VIA INFUSION
50.0000 ug | Freq: Once | INTRAVENOUS | Status: AC
  Administered 2024-10-11: 50 ug via INTRAVENOUS
  Filled 2024-10-11: qty 25

## 2024-10-11 MED ORDER — VANCOMYCIN HCL 750 MG/150ML IV SOLN: 750.0000 mg | INTRAVENOUS | Status: DC

## 2024-10-11 MED ORDER — LORAZEPAM 1 MG PO TABS: 1.0000 mg | ORAL_TABLET | ORAL | Status: AC | PRN

## 2024-10-11 MED ORDER — ONDANSETRON HCL 4 MG/2ML IJ SOLN
4.0000 mg | Freq: Once | INTRAMUSCULAR | Status: AC
  Administered 2024-10-11: 4 mg via INTRAVENOUS
  Filled 2024-10-11: qty 2

## 2024-10-11 MED ORDER — THIAMINE HCL 100 MG/ML IJ SOLN: 100.0000 mg | Freq: Every day | INTRAMUSCULAR | Status: DC

## 2024-10-11 MED ORDER — SODIUM CHLORIDE 0.9 % IV SOLN
2.0000 g | Freq: Two times a day (BID) | INTRAVENOUS | Status: DC
  Administered 2024-10-11 – 2024-10-15 (×8): 2 g via INTRAVENOUS
  Filled 2024-10-11 (×7): qty 12.5

## 2024-10-11 MED ORDER — SODIUM CHLORIDE 0.9 % IV SOLN
12.5000 mg | Freq: Once | INTRAVENOUS | Status: AC
  Administered 2024-10-11: 12.5 mg via INTRAVENOUS
  Filled 2024-10-11: qty 0.5

## 2024-10-11 MED ORDER — FOLIC ACID 1 MG PO TABS
1.0000 mg | ORAL_TABLET | Freq: Every day | ORAL | Status: DC
  Administered 2024-10-12: 1 mg via ORAL
  Filled 2024-10-11: qty 1

## 2024-10-11 MED ORDER — SODIUM CHLORIDE 0.9 % IV SOLN
50.0000 ug/h | INTRAVENOUS | Status: DC
  Administered 2024-10-11 – 2024-10-16 (×10): 50 ug/h via INTRAVENOUS
  Filled 2024-10-11 (×9): qty 1

## 2024-10-11 MED ORDER — POTASSIUM CHLORIDE 10 MEQ/100ML IV SOLN
10.0000 meq | INTRAVENOUS | Status: AC
  Administered 2024-10-11 (×4): 10 meq via INTRAVENOUS
  Filled 2024-10-11 (×4): qty 100

## 2024-10-11 MED ORDER — PANTOPRAZOLE SODIUM 40 MG IV SOLR
40.0000 mg | Freq: Once | INTRAVENOUS | Status: AC
  Administered 2024-10-11: 40 mg via INTRAVENOUS
  Filled 2024-10-11: qty 10

## 2024-10-11 MED ORDER — PHENYTOIN SODIUM EXTENDED 100 MG PO CAPS
100.0000 mg | ORAL_CAPSULE | Freq: Three times a day (TID) | ORAL | Status: DC
  Administered 2024-10-11 – 2024-10-12 (×2): 100 mg via ORAL
  Filled 2024-10-11 (×6): qty 1

## 2024-10-11 MED ORDER — MAGNESIUM SULFATE 2 GM/50ML IV SOLN
2.0000 g | INTRAVENOUS | Status: AC
  Administered 2024-10-11 – 2024-10-12 (×3): 2 g via INTRAVENOUS

## 2024-10-11 MED ORDER — LORAZEPAM 2 MG/ML IJ SOLN: 1.0000 mg | INTRAMUSCULAR | Status: DC | PRN

## 2024-10-11 MED ORDER — PANTOPRAZOLE SODIUM 40 MG IV SOLR
40.0000 mg | Freq: Two times a day (BID) | INTRAVENOUS | Status: AC
  Administered 2024-10-11 – 2024-10-16 (×12): 40 mg via INTRAVENOUS
  Filled 2024-10-11 (×8): qty 10

## 2024-10-11 MED ORDER — ONDANSETRON HCL 4 MG PO TABS: 4.0000 mg | ORAL_TABLET | Freq: Four times a day (QID) | ORAL | Status: DC | PRN

## 2024-10-11 MED ORDER — POTASSIUM CHLORIDE 20 MEQ PO PACK
40.0000 meq | PACK | Freq: Once | ORAL | Status: DC
  Filled 2024-10-11: qty 2

## 2024-10-11 MED ORDER — ACETAMINOPHEN 325 MG PO TABS: 650.0000 mg | ORAL_TABLET | Freq: Four times a day (QID) | ORAL | Status: DC | PRN

## 2024-10-11 MED ORDER — ACETAMINOPHEN 650 MG RE SUPP: 650.0000 mg | Freq: Four times a day (QID) | RECTAL | Status: DC | PRN

## 2024-10-11 MED ORDER — LACTATED RINGERS IV SOLN: INTRAVENOUS | Status: AC

## 2024-10-11 MED ORDER — PHENOBARBITAL 32.4 MG PO TABS
32.4000 mg | ORAL_TABLET | Freq: Two times a day (BID) | ORAL | Status: DC
  Administered 2024-10-11 – 2024-10-12 (×2): 32.4 mg via ORAL
  Filled 2024-10-11 (×3): qty 1

## 2024-10-11 MED ORDER — MAGNESIUM SULFATE 50 % IJ SOLN
6.0000 g | Freq: Once | INTRAVENOUS | Status: DC
  Filled 2024-10-11: qty 12

## 2024-10-11 MED ORDER — SODIUM CHLORIDE 0.9% FLUSH: 10.0000 mL | INTRAVENOUS | Status: DC | PRN

## 2024-10-11 MED ORDER — LORAZEPAM 2 MG/ML IJ SOLN
1.0000 mg | INTRAMUSCULAR | Status: AC | PRN
  Administered 2024-10-13: 2 mg via INTRAVENOUS
  Filled 2024-10-11: qty 1

## 2024-10-11 MED ORDER — SODIUM CHLORIDE 0.9% FLUSH
10.0000 mL | Freq: Two times a day (BID) | INTRAVENOUS | Status: DC
  Administered 2024-10-11 – 2024-10-12 (×2): 10 mL
  Administered 2024-10-12: 30 mL
  Administered 2024-10-13 – 2024-10-18 (×11): 10 mL

## 2024-10-11 MED ORDER — VANCOMYCIN HCL IN DEXTROSE 1-5 GM/200ML-% IV SOLN
1000.0000 mg | Freq: Once | INTRAVENOUS | Status: AC
  Administered 2024-10-11: 1000 mg via INTRAVENOUS
  Filled 2024-10-11: qty 200

## 2024-10-11 MED ORDER — NOREPINEPHRINE 4 MG/250ML-% IV SOLN
0.0000 ug/min | INTRAVENOUS | Status: DC
  Administered 2024-10-11: 2 ug/min via INTRAVENOUS
  Administered 2024-10-12: 7 ug/min via INTRAVENOUS
  Administered 2024-10-12: 8 ug/min via INTRAVENOUS
  Filled 2024-10-11 (×3): qty 250

## 2024-10-11 MED ORDER — POTASSIUM CHLORIDE 10 MEQ/100ML IV SOLN
10.0000 meq | INTRAVENOUS | Status: AC
  Administered 2024-10-11 (×2): 10 meq via INTRAVENOUS
  Filled 2024-10-11 (×2): qty 100

## 2024-10-11 MED ORDER — ONDANSETRON HCL 4 MG/2ML IJ SOLN
4.0000 mg | Freq: Four times a day (QID) | INTRAMUSCULAR | Status: DC | PRN
  Administered 2024-10-11: 4 mg via INTRAVENOUS
  Filled 2024-10-11: qty 2

## 2024-10-11 MED ORDER — POTASSIUM CHLORIDE 20 MEQ PO PACK: 20.0000 meq | PACK | Freq: Two times a day (BID) | ORAL | Status: DC

## 2024-10-11 MED ORDER — IOHEXOL 300 MG/ML  SOLN
80.0000 mL | Freq: Once | INTRAMUSCULAR | Status: AC | PRN
  Administered 2024-10-11: 80 mL via INTRAVENOUS

## 2024-10-11 MED ORDER — ADULT MULTIVITAMIN W/MINERALS CH
1.0000 | ORAL_TABLET | Freq: Every day | ORAL | Status: DC
  Administered 2024-10-12: 1 via ORAL
  Filled 2024-10-11: qty 1

## 2024-10-11 MED ORDER — CHLORHEXIDINE GLUCONATE CLOTH 2 % EX PADS
6.0000 | MEDICATED_PAD | Freq: Every day | CUTANEOUS | Status: DC
  Administered 2024-10-12 – 2024-10-18 (×7): 6 via TOPICAL

## 2024-10-11 NOTE — ED Triage Notes (Signed)
 Pt bib ems from home for vomiting blood and HypoTN,  per ems bp 50s/30s, 212 CBG, Pt has been non verbal with ems but that is not her baseline. Hx of seizure disorder, and right arm contracted. Follows command.

## 2024-10-11 NOTE — Significant Event (Addendum)
"  ° °      CROSS COVER NOTE  NAME: [name] MRN: [id number] DOB : 1956-07-13 ATTENDING PHYSICIAN: Evonnie Lenis, MD    Date of Service   10/11/2024   HPI/Events of Note   [facility] cross cover at [facility] received from nurse patient unable to swallow well potassium ordered, PICC line now in place, request ordered potassium converted to IV and BP 91/57 with HR 31  HPI:  68 year old female with a history of IBS, asthma, stroke, seizure disorder, peptic ulcer disease, alcohol abuse, hypertension presenting with altered mental status. Patient found at home on floor with dark emesis. Reports of heavy alcohol intake as of late and drinks alcohol daily. She was admitted with acute metabolic encephalopathy, alcoholic heapatitis, concern for esophageal varices/esophagitis/ ulcer, AKI, lactic acidosis. She was started on octreotide  and protonix . She was receivd 3.5 L fluids for volume repletion and electrolyte replacement is ongoing  Interventions   Assessment/Plan: Vitals:   10/11/24 2015 10/11/24 2030 10/11/24 2045 10/11/24 2100  BP: (!) 81/45 (!) 106/45  (!) 93/47  Pulse: (!) 136 (!) 129 (!) 128 (!) 126  Temp:      Resp: 15 (!) 21 19 19   Height:      Weight:      SpO2: 100% 100% 100% 99%  TempSrc:      BMI (Calculated):        Stat Mag level added to blood in lab from K level 1840 = 1.2 C/o ongoing nausea  Bedside patient admits to having seizures from alcohol abstinence Neuro alert and oriented to person place situation CV - ST on bedside monitor, QT monitoring Qtc - 463, Resp - non labored sats stable on room air  Shock state - likely multifactorial - Levophed  infusion ordered to maintain MAP above 65; unable to rule out alcohol induced cardiomyopathy given history - consider ECHO if pressor needed beyond volume status repletion 6 gms Mag IV Seizure precautions - continue phenobarb and dilantin ; prn ativan  for seizure activity CIWA protocol with prn ativan  based on score         Erminio LITTIE Cone NP [facility] Cross Cover 7pm-7am - check amion for availability Pager [phone]  "

## 2024-10-11 NOTE — ED Provider Notes (Signed)
 " [facility] Provider Note   CSN: [id number] Arrival date & time: 10/11/24  9277     Patient presents with: Hypotension and Hematemesis   [name] is a 68 y.o. female.   68 year old female past medical history of alcohol abuse, CVA, and PUD presenting to the emergency department today after an apparent episode of hematemesis this morning.  The patient apparently drank a lot of alcohol last night per her family.  She was lying on the floor this morning and apparently had some dark appearing vomit beside her.  Medics state that the carpet in the house is brown so they cannot say exactly what color this was.  The patient was hypotensive in the field.  She was brought to the ER for further evaluation.        Prior to Admission medications  Medication Sig Start Date End Date Taking? Authorizing Provider  acetaminophen  (TYLENOL ) 500 MG tablet Take 500 mg by mouth every 6 (six) hours as needed. As needed for pain    [provider]  albuterol  (VENTOLIN  HFA) 108 (90 Base) MCG/ACT inhaler Inhale 2 puffs into the lungs every 6 (six) hours as needed for wheezing or shortness of breath. 08/06/22   Pearlean Manus, MD  HYDROcodone -acetaminophen  (NORCO/VICODIN) 5-325 MG tablet One tablet every six hours for pain.  Limit 7 days. 07/12/24   Brenna Lin, MD  lisinopril  (ZESTRIL ) 20 MG tablet Take 1 tablet (20 mg total) by mouth daily. 01/09/23   Vann, Jessica U, DO  pantoprazole  (PROTONIX ) 40 MG tablet Take 1 tablet (40 mg total) by mouth daily for 7 days. 10/09/22 09/07/23  Mesner, Selinda, MD  PHENobarbital  (LUMINAL) 32.4 MG tablet Take 1 tablet (32.4 mg total) by mouth 2 (two) times daily. 08/06/22   Pearlean Manus, MD  phenytoin  (DILANTIN ) 200 MG ER capsule Take 1 capsule (200 mg total) by mouth 2 (two) times daily. Patient taking differently: Take 100 mg by mouth 3 (three) times daily. 08/06/22   Pearlean Manus, MD  potassium  chloride (KLOR-CON ) 20 MEQ packet Take 20 mEq by mouth 2 (two) times daily. 05/30/23   Raford Lenis, MD  solifenacin (VESICARE) 5 MG tablet Take 5 mg by mouth daily. 11/24/22   [provider]    Allergies: Patient has no known allergies.    Review of Systems  Reason unable to perform ROS: Altered mental status.    Updated Vital Signs BP 124/72   Pulse (!) 116   Temp 98.6 F (37 C) (Rectal)   Resp 20   Ht 5' 3 (1.6 m)   Wt 64 kg   SpO2 98%   BMI 24.99 kg/m   Physical Exam Vitals and nursing note reviewed.   Gen: Ill-appearing, dark appearing vomitus around the mouth Eyes: PERRL, EOMI HEENT: no oropharyngeal swelling Neck: trachea midline Resp: clear to auscultation bilaterally Card: Tachycardic, no murmurs, rubs, or gallops Abd: Diffuse tenderness with grimacing without guarding or rebound Extremities: no calf tenderness, no edema Vascular: 2+ radial pulses bilaterally, 2+ DP pulses bilaterally Neuro: The patient will open her eyes and nod yes and no to questions, appears to have right-sided deficits from previous stroke with contractures Skin: no rashes Psyc: acting appropriately   (all labs ordered are listed, but only abnormal results are displayed) Labs Reviewed  CBC WITH DIFFERENTIAL/PLATELET - Abnormal; Notable for the following components:      Result Value   Hemoglobin 11.7 (*)    HCT 33.8 (*)  All other components within normal limits  LACTIC ACID, PLASMA - Abnormal; Notable for the following components:   Lactic Acid, Venous >9.0 (*)    All other components within normal limits  LACTIC ACID, PLASMA - Abnormal; Notable for the following components:   Lactic Acid, Venous >9.0 (*)    All other components within normal limits  ETHANOL - Abnormal; Notable for the following components:   Alcohol, Ethyl (B) 180 (*)    All other components within normal limits  COMPREHENSIVE METABOLIC PANEL WITH GFR - Abnormal; Notable for the following components:    Sodium 134 (*)    Potassium 2.4 (*)    Chloride 80 (*)    Glucose, Bld 157 (*)    Creatinine, Ser 1.33 (*)    AST 94 (*)    GFR, Estimated 44 (*)    Anion gap 29 (*)    All other components within normal limits  PROTIME-INR - Abnormal; Notable for the following components:   Prothrombin Time 15.4 (*)    All other components within normal limits  CBG MONITORING, ED - Abnormal; Notable for the following components:   Glucose-Capillary 173 (*)    All other components within normal limits  TROPONIN T, HIGH SENSITIVITY - Abnormal; Notable for the following components:   Troponin T High Sensitivity 38 (*)    All other components within normal limits  CULTURE, BLOOD (ROUTINE X 2)  CULTURE, BLOOD (ROUTINE X 2)  LIPASE, BLOOD  URINALYSIS, ROUTINE W REFLEX MICROSCOPIC  TYPE AND SCREEN    EKG: EKG Interpretation Date/Time:  Thursday October 11 2024 07:29:41 EST Ventricular Rate:  121 PR Interval:  136 QRS Duration:  89 QT Interval:  347 QTC Calculation: 493 R Axis:   30  Text Interpretation: Sinus tachycardia Nonspecific ST abnormality Confirmed by Ula Barter [phone]) on 10/11/2024 9:57:24 AM  Radiology: CT ABDOMEN PELVIS W CONTRAST Result Date: 10/11/2024 CLINICAL DATA:  Abdominal pain.  Vomiting blood. EXAM: CT ABDOMEN AND PELVIS WITH CONTRAST TECHNIQUE: Multidetector CT imaging of the abdomen and pelvis was performed using the standard protocol following bolus administration of intravenous contrast. RADIATION DOSE REDUCTION: This exam was performed according to the departmental dose-optimization program which includes automated exposure control, adjustment of the mA and/or kV according to patient size and/or use of iterative reconstruction technique. CONTRAST:  80mL OMNIPAQUE  IOHEXOL  300 MG/ML  SOLN COMPARISON:  08/08/2024 FINDINGS: Lower chest: Dependent atelectasis noted right lung base. Circumferential wall thickening noted distal esophagus. Hepatobiliary: Nodular liver contour is  compatible cirrhosis. There is no evidence for gallstones, gallbladder wall thickening, or pericholecystic fluid. No intrahepatic or extrahepatic biliary dilation. Pancreas: No focal mass lesion. No dilatation of the main duct. No intraparenchymal cyst. No peripancreatic edema. Spleen: No splenomegaly. No suspicious focal mass lesion. Adrenals/Urinary Tract: No adrenal nodule or mass. Kidneys unremarkable. No evidence for hydroureter. The urinary bladder appears normal for the degree of distention. Stomach/Bowel: Stomach is distended with fluid and gas. Esophagogastric junction is patent with fluid noted in the distal esophagus suggesting reflux. Duodenum is normally positioned as is the ligament of Treitz. No small bowel wall thickening. No small bowel dilatation. The terminal ileum is normal. The appendix is normal. No gross colonic mass. No colonic wall thickening. Diverticular changes are noted in the left colon without evidence of diverticulitis. Vascular/Lymphatic: There is mild atherosclerotic calcification of the abdominal aorta without aneurysm. There is no gastrohepatic or hepatoduodenal ligament lymphadenopathy. No retroperitoneal or mesenteric lymphadenopathy. No pelvic sidewall lymphadenopathy. Reproductive: Hysterectomy.  There  is no adnexal mass. Other: No intraperitoneal free fluid. Musculoskeletal: No worrisome lytic or sclerotic osseous abnormality. Degenerative changes are noted in the hips bilaterally, left greater than right. IMPRESSION: 1. No acute findings in the abdomen or pelvis. Specifically, no findings to explain the patient's history of abdominal pain and vomiting. 2. Nodular liver contour compatible with cirrhosis. 3. Circumferential wall thickening distal esophagus with fluid in the distal esophagus suggesting reflux. Imaging features compatible with esophagitis. Given the history of vomiting blood, upper endoscopy recommended to exclude neoplasm. 4. Left colonic diverticulosis without  diverticulitis. 5.  Aortic Atherosclerosis (ICD10-I70.0). Electronically Signed   By: Camellia Candle M.D.   On: 10/11/2024 11:58   CT Head Wo Contrast Result Date: 10/11/2024 EXAM: CT HEAD WITHOUT CONTRAST 10/11/2024 11:35:24 AM TECHNIQUE: CT of the head was performed without the administration of intravenous contrast. Automated exposure control, iterative reconstruction, and/or weight based adjustment of the mA/kV was utilized to reduce the radiation dose to as low as reasonably achievable. COMPARISON: Head CT 08/08/2024. CLINICAL HISTORY: 68 year old female with vomiting blood and hypotension. FINDINGS: BRAIN AND VENTRICLES: No acute hemorrhage. No evidence of acute infarct. No extra-axial collection. No mass effect or midline shift. 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 Superimposed multifocal chronic cerebral encephalomalacia, pronounced throughout the anterior and superior left frontal lobe, at the right inferior frontal gyrus. Chronic ex vacuo ventricular enlargement. Stable brain volume. No suspicious intracranial vascular hyperdensity. ORBITS: No acute abnormality. SINUSES: Paranasal sinuses, tympanic cavities and mastoids are well aerated. SOFT TISSUES AND SKULL: No acute soft tissue abnormality. Heterogeneity of the calvarium appears stable since 2021 and most likely benign. No acute or suspicious bone lesion. Chronic severe upper cervical spine degeneration partially visible. IMPRESSION: 1. No acute intracranial abnormality. 2. 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 Electronically signed by: Helayne Hurst MD 10/11/2024 11:40 AM EST RP Workstation: [id number]   DG Chest Portable 1 View Result Date: 10/11/2024 CLINICAL DATA:  Possible aspiration.  Hematemesis.   Hypotension. EXAM: PORTABLE CHEST 1 VIEW COMPARISON:  11/29/2021 and CT chest 08/08/2024. FINDINGS: Patient is rotated. Trachea is midline. Heart size stable. There may be minimal right basilar subsegmental atelectasis. Lungs are otherwise clear. No pleural fluid. No pneumothorax. Osteopenia. IMPRESSION: Minimal right basilar subsegmental atelectasis. Electronically Signed   By: Newell Eke M.D.   On: 10/11/2024 09:06     Procedures   Medications Ordered in the ED  potassium chloride  10 mEq in 100 mL IVPB (10 mEq Intravenous New Bag/Given 10/11/24 1230)  metroNIDAZOLE  (FLAGYL ) IVPB 500 mg (500 mg Intravenous New Bag/Given 10/11/24 1221)  lactated ringers  bolus 1,000 mL (1,000 mLs Intravenous Bolus 10/11/24 0753)  pantoprazole  (PROTONIX ) injection 40 mg (40 mg Intravenous Given 10/11/24 0754)  cefTRIAXone  (ROCEPHIN ) 2 g in sodium chloride  0.9 % 100 mL IVPB (0 g Intravenous Stopped 10/11/24 0951)  ondansetron  (ZOFRAN ) injection 4 mg (4 mg Intravenous Given 10/11/24 0752)  iohexol  (OMNIPAQUE ) 300 MG/ML solution 80 mL (80 mLs Intravenous Contrast Given 10/11/24 1122)  lactated ringers  bolus 1,000 mL (1,000 mLs Intravenous New Bag/Given 10/11/24 1009)  promethazine  (PHENERGAN ) 12.5 mg in sodium chloride  0.9 % 50 mL IVPB (0 mg Intravenous Stopped 10/11/24 1052)  lactated ringers  bolus 500 mL (0 mLs Intravenous Stopped 10/11/24 1232)  Medical Decision Making 68 year old female with past medical history of alcohol abuse, PUD, CVA, and seizure disorder presenting to the emergency department today with hypotension and vomiting.  There is concern for possible hematemesis here.  Although she had a sepsis workup on the patient and cover her initially with Rocephin .  Will broaden coverage depending on further workup.  I will give patient IV fluids here.  Will obtain type and screen in the event the patient does require blood transfusion which she very well may.  I will  cover the patient with Protonix .  Looking back in the past it appears that she does not have any known esophageal varices but does have PUD.  I will obtain a CT scan of the patient's head as she was found down.  Also obtain a CT scan of her abdomen for further evaluation.  The patient is nodding no to any chest pain.  Her EKG interpreted by me shows a sinus tachycardia with nonspecific ST-T changes.  This does not appear to be a STEMI on my interpretation.  The patient's labs are reassuring with exception of her lactic acid which is significantly elevated.  Patient is given additional IV fluids here.  Blood pressures have improved.  The patient's potassium is low at 2.4.  She is covered with Flagyl  in addition to the Rocephin  that she initially received pending her CT of her abdomen.  CT scan of her abdomen shows findings concerning for possible malignancy in the esophagus but no other acute findings.  Calls placed to hospitalist service for admission.  CRITICAL CARE Performed by: Prentice JONELLE Medicus   Total critical care time: 45 minutes  Critical care time was exclusive of separately billable procedures and treating other patients.  Critical care was necessary to treat or prevent imminent or life-threatening deterioration.  Critical care was time spent personally by me on the following activities: development of treatment plan with patient and/or surrogate as well as nursing, discussions with consultants, evaluation of patient's response to treatment, examination of patient, obtaining history from patient or surrogate, ordering and performing treatments and interventions, ordering and review of laboratory studies, ordering and review of radiographic studies, pulse oximetry and re-evaluation of patient's condition.   Amount and/or Complexity of Data Reviewed Labs: ordered. Radiology: ordered.  Risk Prescription drug management. Decision regarding hospitalization.        Final diagnoses:   Hematemesis, unspecified whether nausea present  Lactic acidosis    ED Discharge Orders     None          [facility] Prentice JONELLE, MD 10/11/24 1245  "

## 2024-10-11 NOTE — Hospital Course (Addendum)
 LECRETIA BUCZEK is a 68 y.o. female with PMH of IBS, asthma, stroke, seizure disorder, peptic ulcer disease, alcohol abuse, last seen by family drinking and dependence of alcohol in the evening of 12/24 and was found by family at home lying on the floor with dark emesis, seen at [facility] 12/25 12/25 In the ZI:jqzampoz,pwpupjoob hypotensive  62/44. S/p fluid resuscitated with 2.5 L of fluids.  He was started on ceftriaxone  and metronidazole .  Her blood pressure improved.  WBC 6.8, hemoglobin 1.7, platelets 216.  AST 94, ALT 35, alk phosphatase 123, total bilirubin 0.9.  Sodium 134, potassium 2.4, bicarbonate 26, serum creatinine 1.33.  Lipase 44. CT abdomen and pelvis>>negative for any acute findings. Chest x-ray>>negative for any acute findings. CT of the brain>>negative for any acute findings but showed previous aneurysmal clip. EKG showed sinus tachycardia.  Troponin 38.  Lactic acid >9 Despite aggressive fluid [name] patient was hypotensive and needed pressors  GI was consulted started on octreotide  infusion and pantoprazole  bid and empiric vancomycin  and cefepime . 12/26>transferred to [facility], ICU.  Placed on phenobarbital  taper for alcohol withdrawal remains on octreotide , treated for shock S/P 1 unit transfusion, lactic acidosis improved. 12/28> transferred to [facility] service Mental status remains to be confused, phenobarbital  tapered off, subsequently underwent EGD 12/30 and placed on regular diet PT OT recommending SNF  Subjective: Seen and examined Patient appears alert awake, knows she is in the hospital unable to tell me president name or current year Overnight events afebrile BP stable not hypoxic, labs reviewed sodium improving slowly potassium stable mag 1.2 Phos normalized CBC with improving thrombocytopenia and stable hemoglobin 8-9g  Assessment and plan:   Severe alcohol abuse Alcohol withdrawal: S/p phenobarbital  tapered off , cont  folate thiamine . TOC consult  for cessation resources.  Mentation improving  Acute metabolic encephalopathy: Multifactorial in the setting of alcohol withdrawal acute illness.  Now off cefepime .minimize narcotics, completed phenobarbital  12/29 Mental status appears stable overall improving. Cont ptot plan for SNF  Dark Emesis Concern for upper GI bleeding ABLA on chronic anemia Alcoholic liver cirrhosis\ Esophagitis: GI following for s/pEGD-LA Grade C reflux esophagitis with no bleeding, ucerations from this process (source of recent                     self-limited bleeding) - to cont pi bid x 4 wk. OFF Octreotide .  Discontinue antibiotics. Hb as low as 6.7 this admit s/p 1 u prbc-baseline 10-11 ~ 2 month ago.  Holding stable at 8 to 9 g.  Monitor  Thrombocytopenia Leukopenia: Suspect in the setting of acute illness liver cirrhosis.wbc improved,  platelet slowly trending up.  Monitor CBC closely  Recent Labs  Lab 10/15/24 0503 10/16/24 0346 10/17/24 0208  HGB 9.0* 8.2* 8.2*  HCT 27.3* 25.5* 25.8*  WBC 8.2 6.4 5.3  PLT 62* 65* 79*     Undifferentiated shock Hypovolemic shock Possible hemorrhagic shock: Vital signs stabilized post fluid resuscitation and pressors and blood transfusion.  Hypernatremia Hypokalemia Hypophosphatemia Hypomagnesemia: K and Phos normalized, replace magnesium .  Hypernatremia due to free water  deficit poor intake,on a regular diet, increase D5W to 100 cc/h monitor BMP in a.m. Recent Labs  Lab 10/11/24 1837 10/12/24 0023 10/12/24 1804 10/14/24 0519 10/14/24 2044 10/15/24 0503 10/16/24 0346 10/17/24 0208  K 3.9 3.9 3.5 3.2*  --  3.2* 3.1* 3.6  CALCIUM  --  8.1* 8.1* 8.3*  --  8.5* 7.7* 7.5*  MG 1.2* 3.5* 2.0  --   --   --   --  1.2*  PHOS  --  2.1* 1.4*  --  1.7*  --   --  2.5   Recent Labs  Lab 10/12/24 1804 10/14/24 0519 10/15/24 0503 10/16/24 0346 10/17/24 0208  NA 142 148* 147* 154* 152*     Lactic acidosis up to >9 In the setting of alcohol abuse  hypovolemia.  Level 5.6 on 12/26 In ICU   Seizure disorder Cont dilantin   Moderate malnutrition Hypoalbuminemia: Augment diet as tolerated   Deconditioning/debility/weakness Continue PT OT recommending SNF as below. PT Orders: Active PT Follow up Rec: Skilled Nursing-Short Term Rehab (<3 Hours/Day)10/16/2024 1557   DVT prophylaxis: SCDs Start: 10/11/24 1354 Code Status:   Code Status: Full Code Family Communication: plan of care discussed with patient at bedside.  Patient status is: Remains hospitalized because of severity of illness Level of care: Med-Surg   Dispo: The patient is from: home            Anticipated disposition:  SNF tomorrow if electrolytes stable. SNF bed pending  Objective: Vitals last 24 hrs: Vitals:   10/16/24 1710 10/16/24 1936 10/16/24 2315 10/17/24 0800  BP: 124/60 105/60 111/67 131/75  Pulse: 93 90 81 91  Resp:  17 17 16   Temp: 98.6 F (37 C) 97.7 F (36.5 C) 97.7 F (36.5 C) 98.8 F (37.1 C)  TempSrc:   Oral Oral  SpO2: 100% 100% 95% 100%  Weight:      Height:        Physical Examination: General exam: AAOx2 HEENT:Oral mucosa moist, Ear/Nose WNL grossly Respiratory system:  CTA bilaterally Cardiovascular system: S1 & S2 +, No JVD. Gastrointestinal system: Abdomen soft,NT,ND, BS+ Nervous System: Alert, awake, right arm with contractures weak rt side leg, able to move left side Extremities: extremities warm, leg edema neg Skin: Warm, no rashes MSK: Normal muscle bulk,tone, power   Medications reviewed:  Scheduled Meds:  Chlorhexidine  Gluconate Cloth  6 each Topical Q0600   feeding supplement  1 Container Oral TID BM   folic acid   1 mg Oral Daily   lactulose   20 g Oral TID   magnesium  oxide  400 mg Oral BID   multivitamin with minerals  1 tablet Oral Daily   pantoprazole   40 mg Oral BID AC   phenytoin   100 mg Oral TID   sodium chloride  flush  10-40 mL Intracatheter Q12H   thiamine   100 mg Oral Daily   Continuous Infusions:   magnesium  sulfate bolus IVPB 4 g (10/17/24 0916)   Diet: Diet Order             Diet 2 gram sodium Fluid consistency: Thin  Diet effective now

## 2024-10-11 NOTE — Progress Notes (Signed)
 Peripherally Inserted Central Catheter Placement  The IV Nurse has discussed with the patient and/or persons authorized to consent for the patient, the purpose of this procedure and the potential benefits and risks involved with this procedure.  The benefits include less needle sticks, lab draws from the catheter, and the patient may be discharged home with the catheter. Risks include, but not limited to, infection, bleeding, blood clot (thrombus formation), and puncture of an artery; nerve damage and irregular heartbeat and possibility to perform a PICC exchange if needed/ordered by physician.  Alternatives to this procedure were also discussed.  Bard Power PICC patient education guide, fact sheet on infection prevention and patient information card has been provided to patient /or left at bedside.  Patient gave verbal consent at her preference to myself and Zachary Horn, RN at bedside.  PICC Placement Documentation  PICC Triple Lumen 10/11/24 Left Brachial 38 cm 0 cm (Active)  Indication for Insertion or Continuance of Line Vasoactive infusions;Poor Vasculature-patient has had multiple peripheral attempts or PIVs lasting less than 24 hours 10/11/24 1931  Exposed Catheter (cm) 0 cm 10/11/24 1931  Site Assessment Clean, Dry, Intact 10/11/24 1931  Lumen #1 Status Flushed;Saline locked;Blood return noted 10/11/24 1931  Lumen #2 Status Flushed;Saline locked;Blood return noted 10/11/24 1931  Lumen #3 Status Flushed;Saline locked;Blood return noted 10/11/24 1931  Dressing Type Transparent;Securing device 10/11/24 1931  Dressing Status Antimicrobial disc/dressing in place;Clean, Dry, Intact 10/11/24 1931  Line Care Connections checked and tightened 10/11/24 1931  Line Adjustment (NICU/IV Team Only) No 10/11/24 1931  Dressing Intervention New dressing;Adhesive placed at insertion site (IV team only) 10/11/24 1931  Dressing Change Due 10/18/24 10/11/24 1931       Dehlia Kilner Loraine 10/11/2024,  7:31 PM

## 2024-10-11 NOTE — ED Notes (Signed)
 Pt has received half bag of fluids, BP increasing 105/64, Pt is becoming more responsive.

## 2024-10-11 NOTE — H&P (Signed)
 " History and Physical    Patient: [name] FMW:[id number] DOB: 05-09-1956 DOA: 10/11/2024 DOS: the patient was seen and examined on 10/11/2024 PCP: Carlette Benita Area, MD  Patient coming from: Home  Chief Complaint:  Chief Complaint  Patient presents with   Hypotension   Hematemesis   HPI: [name] is a 68 year old female with a history of IBS, asthma, stroke, seizure disorder, peptic ulcer disease, alcohol abuse, hypertension presenting with altered mental status.  The patient was found lying on the floor with dark emesis next to her at home.  Patient does not recall any events from last evening.  Family states that the patient has been drinking an abundance of alcohol on the evening of 10/10/2024.  She continues to drink daily.  There was some question whether her emesis was bloody because of the color of the carpet.  Nevertheless, the patient was somnolent upon arrival and hypotensive.  She became more responsive after fluids and antibiotics, but remained slow in responding.  The patient denies any illicit drug use but states that she drinks daily.  She denies tobacco use.  She denies any chest pain, shortness breath, headache, neck pain.  She complains of abdominal pain.  She states that she has chronic abdominal pain.  She denies any hematochezia or melena.  She denies any dysuria. In the ED, the patient was afebrile.  She was initially hypotensive with a blood pressure of 62/44.  She was fluid resuscitated with 2.5 L of fluids.  He was started on ceftriaxone  and metronidazole .  Her blood pressure improved.  WBC 6.8, hemoglobin 1.7, platelets 216.  AST 94, ALT 35, alk phosphatase 123, total bilirubin 0.9.  Sodium 134, potassium 2.4, bicarbonate 26, serum creatinine 1.33.  Lipase 44.  CT abdomen and pelvis was negative for any acute findings.  Chest x-ray was negative for any acute findings.  CT of the brain was negative for any acute findings but showed previous aneurysmal  clip.  EKG showed sinus tachycardia.  Troponin 38.  Lactic acid >9 Patient was admitted for further evaluation of her SIRS picture.  Review of Systems: As mentioned in the history of present illness. All other systems reviewed and are negative. Past Medical History:  Diagnosis Date   Alcohol abuse    Asthma    Contracture of muscle of hand    right   GERD (gastroesophageal reflux disease)    HTN (hypertension)    PUD (peptic ulcer disease)    remote   Seizures (HCC)    since stroke, no recent seizures   Sleep apnea    Stroke [facility])    age 57, required brain surgery effected right side   Past Surgical History:  Procedure Laterality Date   ABDOMINAL HYSTERECTOMY  2004   complete   BIOPSY  10/06/2022   Procedure: BIOPSY;  Surgeon: Cindie Carlin POUR, DO;  [facility];  Service: Endoscopy;;   BRAIN SURGERY     age 15 stroke   COLONOSCOPY     COLONOSCOPY WITH PROPOFOL   10/03/2012   Dr. Harvey: moderate diverticulosis, small internal hemorrhoids, TUBULAR ADENOMA. Next colonoscopy 2023 per Dr. harvey.   COLONOSCOPY WITH PROPOFOL  N/A 10/06/2022   Procedure: COLONOSCOPY WITH PROPOFOL ;  Surgeon: Cindie Carlin POUR, DO;  [facility];  Service: Endoscopy;  Laterality: N/A;  9:15 am   ESOPHAGOGASTRODUODENOSCOPY N/A 10/18/2014   Dr. Shaaron during inpatient hospitalization: severe exudative/erosive reflux esophagitis as source of trivial upper GI bleed. No varices  ESOPHAGOGASTRODUODENOSCOPY (EGD) WITH PROPOFOL   10/03/2012   Dr. Harvey: stricture at GE junction s/p dilation, mild gastritis negative H.pylori    ESOPHAGOGASTRODUODENOSCOPY (EGD) WITH PROPOFOL  N/A 12/27/2017   Grade D esophagitis. Medium-sized hiatal hernia, normal duodenum   ESOPHAGOGASTRODUODENOSCOPY (EGD) WITH PROPOFOL  N/A 10/06/2022   Procedure: ESOPHAGOGASTRODUODENOSCOPY (EGD) WITH PROPOFOL ;  Surgeon: Cindie Carlin POUR, DO;  [facility];  Service: Endoscopy;  Laterality: N/A;   POLYPECTOMY   10/06/2022   Procedure: POLYPECTOMY;  Surgeon: Cindie Carlin POUR, DO;  [facility];  Service: Endoscopy;;   right tib-fib fracture  2008   SAVORY DILATION  10/03/2012   Procedure: SAVORY DILATION;  Surgeon: Margo LITTIE Harvey, MD;  Location: [facility];  Service: Endoscopy;  Laterality: N/A;  started at 1303,  dilated 12.8-16mm   Social History:  reports that she quit smoking about 10 years ago. Her smoking use included cigarettes. She started smoking about 19 years ago. She has a 0.4 pack-year smoking history. She has never used smokeless tobacco. She reports current alcohol use of about 35.0 standard drinks of alcohol per week. She reports that she does not use drugs.  Allergies[1]  Family History  Problem Relation Age of Onset   Liver disease Sister        etoh   Colon cancer Neg Hx    Colon polyps Neg Hx     Prior to Admission medications  Medication Sig Start Date End Date Taking? Authorizing Provider  acetaminophen  (TYLENOL ) 500 MG tablet Take 500 mg by mouth every 6 (six) hours as needed. As needed for pain    [provider]  albuterol  (VENTOLIN  HFA) 108 (90 Base) MCG/ACT inhaler Inhale 2 puffs into the lungs every 6 (six) hours as needed for wheezing or shortness of breath. 08/06/22   Pearlean Manus, MD  HYDROcodone -acetaminophen  (NORCO/VICODIN) 5-325 MG tablet One tablet every six hours for pain.  Limit 7 days. 07/12/24   Brenna Lin, MD  lisinopril  (ZESTRIL ) 20 MG tablet Take 1 tablet (20 mg total) by mouth daily. 01/09/23   Vann, Jessica U, DO  pantoprazole  (PROTONIX ) 40 MG tablet Take 1 tablet (40 mg total) by mouth daily for 7 days. 10/09/22 09/07/23  Mesner, Selinda, MD  PHENobarbital  (LUMINAL) 32.4 MG tablet Take 1 tablet (32.4 mg total) by mouth 2 (two) times daily. 08/06/22   Pearlean Manus, MD  phenytoin  (DILANTIN ) 200 MG ER capsule Take 1 capsule (200 mg total) by mouth 2 (two) times daily. Patient taking differently: Take 100 mg by mouth 3 (three) times  daily. 08/06/22   Pearlean Manus, MD  potassium chloride  (KLOR-CON ) 20 MEQ packet Take 20 mEq by mouth 2 (two) times daily. 05/30/23   Raford Lenis, MD  solifenacin (VESICARE) 5 MG tablet Take 5 mg by mouth daily. 11/24/22   [provider]    Physical Exam: Vitals:   10/11/24 0817 10/11/24 0835 10/11/24 0845 10/11/24 1200  BP:  111/63  124/72  Pulse: (!) 111 (!) 111 (!) 111 (!) 116  Resp: (!) 24 20 (!) 23 20  Temp:      TempSrc:      SpO2: 90% 99% 100% 98%  Weight:      Height:       GENERAL:  A&O x 2, NAD, well developed, cooperative, follows commands HEENT: [place]ank/AT, No thrush, No icterus, No oral ulcers Neck:  No neck mass, No meningismus, soft, supple CV: RRR, no S3, no S4, no rub, no JVD Lungs: Bibasilar crackles.  No wheezing  Abd: soft/NT +BS, nondistended Ext: No edema, no lymphangitis, no cyanosis, no rashes Neuro:  CN II-XII intact, strength 3-/5 in RUE, 3-/5 RLE, strength 4/5 LUE, LLE; sensation intact bilateral; no dysmetria; babinski equivocal  Data Reviewed: Data reviewed above in the history Assessment and Plan: Acute metabolic encephalopathy - Multifactorial including alcohol intoxication, infectious process, acute kidney injury - B12 - Folic acid  - Ammonia - TSH - CT brain negative - Obtain UA  Alcoholic hepatitis - Monitor LFTs - Right upper quadrant ultrasound  Esophageal wall thickening/peptic ulcer disease/hematemesis - Patient has questionable hematemesis - GI consult - Pantoprazole  twice daily - 10/06/2022 EGD--grade C esophagitis, mild Schatzki's ring, gastritis - started on octreotide   Acute kidney injury - Secondary to volume depletion - Baseline creatinine 0.6-0.8 - Presented with serum creatinine 1.33 - Continue IV fluids  Lactic acidosis - Multifactorial including delayed hepatic clearance, alcoholic ketosis, AKI, and possible infectious process - Continue IV fluids  Hypokalemia - Replete - Check magnesium   Asthma -  Continue bronchodilators as needed  History of seizures - Check Dilantin  and phenobarb levels - Continue phenobarbital  and Dilantin   Stroke at age 76 (required craniotomy)  -CT head without new acute findings however there is evidence of Atrophy, small-vessel disease and bifrontal/left temporal encephalomalacia, 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     Advance Care Planning: FULL  Consults: GI  Family Communication: none present  Severity of Illness: The appropriate patient status for this patient is INPATIENT. Inpatient status is judged to be reasonable and necessary in order to provide the required intensity of service to ensure the patient's safety. The patient's presenting symptoms, physical exam findings, and initial radiographic and laboratory data in the context of their chronic comorbidities is felt to place them at high risk for further clinical deterioration. Furthermore, it is not anticipated that the patient will be medically stable for discharge from the hospital within 2 midnights of admission.   * I certify that at the point of admission it is my clinical judgment that the patient will require inpatient hospital care spanning beyond 2 midnights from the point of admission due to high intensity of service, high risk for further deterioration and high frequency of surveillance required.*  Author: Alm Schneider, MD 10/11/2024 1:37 PM  For on call review [email or web address].     [1] No Known Allergies  "

## 2024-10-11 NOTE — Progress Notes (Signed)
 Pharmacy Antibiotic Note  [name] is a 68 y.o. female admitted on 10/11/2024 with sepsis.  Pharmacy has been consulted for vancomycin  and cefepime  dosing.  Patient currently afebrile, wbc normal. Scr slightly elevated at 1.33. LA >9.   Vancomycin  1g x1 then 750 mg IV Q 24 hrs. Goal AUC 400-550. Expected AUC: 542 SCr used: 1.33 Cefepime  2g q12 hours  Height: 5' 3 (160 cm) Weight: 59.3 kg (130 lb 11.7 oz) IBW/kg (Calculated) : 52.4  Temp (24hrs), Avg:98.3 F (36.8 C), Min:98 F (36.7 C), Max:98.6 F (37 C)  Recent Labs  Lab 10/11/24 0805 10/11/24 0807 10/11/24 0909  WBC  --  6.8  --   CREATININE 1.33*  --   --   LATICACIDVEN  --  >9.0* >9.0*    Estimated Creatinine Clearance: 34 mL/min (A) (by C-G formula based on SCr of 1.33 mg/dL (H)).    Allergies[1]  Thank you for allowing pharmacy to be a part of this patients care.  Dempsey Blush PharmD., BCPS Clinical Pharmacist 10/11/2024 2:22 PM     [1] No Known Allergies

## 2024-10-11 NOTE — Progress Notes (Signed)
 After arriving to the unit In and out cath x 4 was attempted. 2 different RN's, myself and charge nurse. Unsuccesful. MD made aware.

## 2024-10-11 NOTE — Plan of Care (Signed)

## 2024-10-12 ENCOUNTER — Other Ambulatory Visit: Payer: Self-pay

## 2024-10-12 DIAGNOSIS — K922 Gastrointestinal hemorrhage, unspecified: Secondary | ICD-10-CM | POA: Diagnosis not present

## 2024-10-12 DIAGNOSIS — E43 Unspecified severe protein-calorie malnutrition: Secondary | ICD-10-CM

## 2024-10-12 DIAGNOSIS — K7031 Alcoholic cirrhosis of liver with ascites: Secondary | ICD-10-CM

## 2024-10-12 DIAGNOSIS — K7682 Hepatic encephalopathy: Secondary | ICD-10-CM

## 2024-10-12 DIAGNOSIS — E872 Acidosis, unspecified: Secondary | ICD-10-CM | POA: Diagnosis not present

## 2024-10-12 DIAGNOSIS — I8501 Esophageal varices with bleeding: Secondary | ICD-10-CM | POA: Diagnosis not present

## 2024-10-12 DIAGNOSIS — R6510 Systemic inflammatory response syndrome (SIRS) of non-infectious origin without acute organ dysfunction: Secondary | ICD-10-CM | POA: Diagnosis not present

## 2024-10-12 DIAGNOSIS — R7401 Elevation of levels of liver transaminase levels: Secondary | ICD-10-CM | POA: Diagnosis not present

## 2024-10-12 DIAGNOSIS — K7030 Alcoholic cirrhosis of liver without ascites: Secondary | ICD-10-CM | POA: Diagnosis not present

## 2024-10-12 DIAGNOSIS — R571 Hypovolemic shock: Secondary | ICD-10-CM

## 2024-10-12 DIAGNOSIS — D62 Acute posthemorrhagic anemia: Secondary | ICD-10-CM

## 2024-10-12 DIAGNOSIS — F1010 Alcohol abuse, uncomplicated: Secondary | ICD-10-CM | POA: Diagnosis not present

## 2024-10-12 DIAGNOSIS — R569 Unspecified convulsions: Secondary | ICD-10-CM

## 2024-10-12 DIAGNOSIS — K920 Hematemesis: Secondary | ICD-10-CM | POA: Diagnosis not present

## 2024-10-12 LAB — COMPREHENSIVE METABOLIC PANEL WITH GFR
ALT: 25 U/L (ref 0–44)
ALT: 18 U/L (ref 0–44)
AST: 68 U/L — ABNORMAL HIGH (ref 15–41)
AST: 47 U/L — ABNORMAL HIGH (ref 15–41)
Albumin: 2.9 g/dL — ABNORMAL LOW (ref 3.5–5.0)
Albumin: 2.6 g/dL — ABNORMAL LOW (ref 3.5–5.0)
Alkaline Phosphatase: 82 U/L (ref 38–126)
Alkaline Phosphatase: 74 U/L (ref 38–126)
Anion gap: 12 (ref 5–15)
Anion gap: 5 (ref 5–15)
BUN: 17 mg/dL (ref 8–23)
BUN: 13 mg/dL (ref 8–23)
CO2: 28 mmol/L (ref 22–32)
CO2: 30 mmol/L (ref 22–32)
Calcium: 8.1 mg/dL — ABNORMAL LOW (ref 8.9–10.3)
Calcium: 8.1 mg/dL — ABNORMAL LOW (ref 8.9–10.3)
Chloride: 96 mmol/L — ABNORMAL LOW (ref 98–111)
Chloride: 107 mmol/L (ref 98–111)
Creatinine, Ser: 0.89 mg/dL (ref 0.44–1.00)
Creatinine, Ser: 0.75 mg/dL (ref 0.44–1.00)
GFR, Estimated: >60 mL/min
GFR, Estimated: >60 mL/min
Glucose, Bld: 132 mg/dL — ABNORMAL HIGH (ref 70–99)
Glucose, Bld: 124 mg/dL — ABNORMAL HIGH (ref 70–99)
Potassium: 3.9 mmol/L (ref 3.5–5.1)
Potassium: 3.5 mmol/L (ref 3.5–5.1)
Sodium: 136 mmol/L (ref 135–145)
Sodium: 142 mmol/L (ref 135–145)
Total Bilirubin: 1 mg/dL (ref 0.0–1.2)
Total Bilirubin: 0.8 mg/dL (ref 0.0–1.2)
Total Protein: 5.4 g/dL — ABNORMAL LOW (ref 6.5–8.1)
Total Protein: 4.8 g/dL — ABNORMAL LOW (ref 6.5–8.1)

## 2024-10-12 LAB — GLUCOSE, CAPILLARY
Glucose-Capillary: 167 mg/dL — ABNORMAL HIGH (ref 70–99)
Glucose-Capillary: 112 mg/dL — ABNORMAL HIGH (ref 70–99)

## 2024-10-12 LAB — CBC
HCT: 23.5 % — ABNORMAL LOW (ref 36.0–46.0)
HCT: 19.9 % — ABNORMAL LOW (ref 36.0–46.0)
Hemoglobin: 8.2 g/dL — ABNORMAL LOW (ref 12.0–15.0)
Hemoglobin: 6.7 g/dL — CL (ref 12.0–15.0)
MCH: 30.8 pg (ref 26.0–34.0)
MCH: 30 pg (ref 26.0–34.0)
MCHC: 34.9 g/dL (ref 30.0–36.0)
MCHC: 33.7 g/dL (ref 30.0–36.0)
MCV: 88.3 fL (ref 80.0–100.0)
MCV: 89.2 fL (ref 80.0–100.0)
Platelets: 120 K/uL — ABNORMAL LOW (ref 150–400)
Platelets: 68 K/uL — ABNORMAL LOW (ref 150–400)
RBC: 2.66 MIL/uL — ABNORMAL LOW (ref 3.87–5.11)
RBC: 2.23 MIL/uL — ABNORMAL LOW (ref 3.87–5.11)
RDW: 15.5 % (ref 11.5–15.5)
RDW: 15.9 % — ABNORMAL HIGH (ref 11.5–15.5)
WBC: 13.7 K/uL — ABNORMAL HIGH (ref 4.0–10.5)
WBC: 4.9 K/uL (ref 4.0–10.5)
nRBC: 0 % (ref 0.0–0.2)
nRBC: 0 % (ref 0.0–0.2)

## 2024-10-12 LAB — HIV ANTIBODY (ROUTINE TESTING W REFLEX): HIV Screen 4th Generation wRfx: NONREACTIVE

## 2024-10-12 LAB — MAGNESIUM
Magnesium: 3.5 mg/dL — ABNORMAL HIGH (ref 1.7–2.4)
Magnesium: 2 mg/dL (ref 1.7–2.4)

## 2024-10-12 LAB — HEMOGLOBIN AND HEMATOCRIT, BLOOD
HCT: 20.1 % — ABNORMAL LOW (ref 36.0–46.0)
Hemoglobin: 6.7 g/dL — CL (ref 12.0–15.0)

## 2024-10-12 LAB — PREPARE RBC (CROSSMATCH)

## 2024-10-12 LAB — LACTIC ACID, PLASMA: Lactic Acid, Venous: 5.6 mmol/L (ref 0.5–1.9)

## 2024-10-12 LAB — PHOSPHORUS
Phosphorus: 2.1 mg/dL — ABNORMAL LOW (ref 2.5–4.6)
Phosphorus: 1.4 mg/dL — ABNORMAL LOW (ref 2.5–4.6)

## 2024-10-12 LAB — PHENOBARBITAL LEVEL: Phenobarbital: <2.4 ug/mL — ABNORMAL LOW (ref 15.0–40.0)

## 2024-10-12 LAB — PROCALCITONIN: Procalcitonin: 0.96 ng/mL

## 2024-10-12 LAB — AMMONIA: Ammonia: 21 umol/L (ref 9–35)

## 2024-10-12 MED ORDER — LACTATED RINGERS IV BOLUS
1000.0000 mL | Freq: Once | INTRAVENOUS | Status: AC
  Administered 2024-10-12: 1000 mL via INTRAVENOUS

## 2024-10-12 MED ORDER — ACETAMINOPHEN 10 MG/ML IV SOLN
1000.0000 mg | Freq: Once | INTRAVENOUS | Status: AC
  Administered 2024-10-13: 1000 mg via INTRAVENOUS
  Filled 2024-10-12: qty 100

## 2024-10-12 MED ORDER — VANCOMYCIN HCL IN DEXTROSE 1-5 GM/200ML-% IV SOLN: 1000.0000 mg | INTRAVENOUS | Status: DC

## 2024-10-12 MED ORDER — SODIUM CHLORIDE 0.9% IV SOLUTION: Freq: Once | INTRAVENOUS | Status: AC

## 2024-10-12 MED ORDER — LACTATED RINGERS IV SOLN: INTRAVENOUS | Status: AC

## 2024-10-12 NOTE — Progress Notes (Signed)
 eLink Physician-Brief Progress Note Patient Name: [name] DOB: 1956/04/13 MRN: [id number]   Date of Service  10/12/2024  HPI/Events of Note  Per bedside RN patient is c/o R arm pain rated 6/10 with no PRNs for pain at this time. Per GI no meds PO. Asking for something IV route please. Patient w/R arm contracture from previous CVA. Has oral tylenol  ordered pRN   eICU Interventions  I removed the oral order and wrote for IV tylenol  x 1      Intervention Category Intermediate Interventions: Pain - evaluation and management  [name] 10/12/2024, 10:58 PM

## 2024-10-12 NOTE — Progress Notes (Signed)
 [facility] en route. Nurse-to-nurse report called to [facility] 56M. Patient aware.

## 2024-10-12 NOTE — Consult Note (Addendum)
 "  NAME:  [name], MRN:  [id number], DOB:  [birth year], LOS: 1 ADMISSION DATE:  10/11/2024, CONSULTATION DATE:  10/12/24 REFERRING MD:  Tat, CHIEF COMPLAINT:  shock   History of Present Illness:  68 yo F PMH IBS PUD asthma sz disorder etoh abuse admitted to [facility] 10/11/24 after being found by family at home, lying on the floor w dark emesis.  Hypotensive, improved initially w IVF but ultimately req NE.  Was started on octreotide  and BID PPI, as well as vanc cefepime .   GI was consulted 12/26 and due to ongoing NE req, advised txf to [place] campus for higher level of care   PCCM consulted in this setting    On arrival to [facility] she is endorsing mild abd tenderness. States she vomited at [facility] this morning, and when asked she endorsed coffee ground emesis. No further emesis.  States she's has had watery emesis at home x 8 days   Pertinent  Medical History  Etoh abuse Esophagitis  Cirrhosis  Sz disorder   Significant Hospital Events: Including procedures, antibiotic start and stop dates in addition to other pertinent events   12/25 admit to hospital. Started on IOWA  12/26 txf to ICU at [facility] w persistent NE  Interim History / Subjective:  Arrives to [facility]   Objective    Blood pressure (!) 132/48, pulse 98, temperature 98.5 F (36.9 C), temperature source Oral, resp. rate 19, height 5' 3 (1.6 m), weight 59.3 kg, SpO2 100%.        Intake/Output Summary (Last 24 hours) at 10/12/2024 1218 Last data filed at 10/12/2024 1208 Gross per 24 hour  Intake 6226.49 ml  Output 800 ml  Net 5426.49 ml   Filed Weights   10/11/24 0731 10/11/24 1412  Weight: 64 kg 59.3 kg    Examination: General: chronically and acutely ill F, appears older than stated age  HENT: poor dentition dry mm  Lungs: even unlabored on RA  Cardiovascular: rr cap refill brisk  Abdomen: generalized mild tenderness  Extremities: R hand contracture  Neuro: awake following commands. Oriented, but poor  historian GU: defer  Resolved problem list  AKI Hypomagnesemia Assessment and Plan   EtOH abuse  -on CIWA + BZD  -micronutrient support. consider high dose thiamine   -needs cessation support   Coffee ground emesis Hx erosive esophagitis  Gastritis  Etoh cirrhosis  Elevated LFTs, mild  -known esophagitis likely explains ?hematemesis but agree w rad read, need to exclude neoplasm  -MDF 7.4, MELD na 8 P -GI consult  -BID PPI -arrives on octreotide , not sure that there is much utility in continuing. Will defer to GI    -type and screen -will get a CBC now, then serial h/h & AM CBC -goal hgb >7, or if unstable + bleeding  -will keep NPO, sips w meds (AEDs) & can shift to strict NPO at GI discretion/timing   Shock, undifferentiated.  Possible sepsis  -pct 0.96. - has had progressive hgb drop but doesn't sound like identifiable ongoing bleeding  P -wean NE for MAP 65  -was on vanc cefepime , will dc vanc w neg MRSA PCR  - follow micro data - active T&S  -transfuse for hgb >7, or hemodynamically unstable/bleeding  Lactic acidosis -was improving some -cirrhosis, shock P -repeat -supportive care    Anemia thrombocytopenia  -looks like acute on chronic P -as above, CBC now + serial h/h then AM cbc  -send T/S -goal hgb>7  Hypophosphatemia  -recheck, replace if  needed   Protein calorie malnutrition  -2/2 etoh abuse P -rdn consult this admission. Poor utility right now while we await EGD plans   Sz disorder P -phenobarb, dilantin    Labs   CBC: Recent Labs  Lab 10/11/24 0807 10/11/24 1837 10/12/24 0023  WBC 6.8 10.4 13.7*  NEUTROABS 5.7  --   --   HGB 11.7* 9.3* 8.2*  HCT 33.8* 27.1* 23.5*  MCV 86.0 89.7 88.3  PLT 216 129* 120*    Basic Metabolic Panel: Recent Labs  Lab 10/11/24 0805 10/11/24 1837 10/12/24 0023  NA 134*  --  136  K 2.4* 3.9 3.9  CL 80*  --  96*  CO2 26  --  28  GLUCOSE 157*  --  132*  BUN 13  --  17  CREATININE 1.33*  --   0.89  CALCIUM 9.3  --  8.1*  MG  --  1.2* 3.5*  PHOS  --   --  2.1*   GFR: Estimated Creatinine Clearance: 50.7 mL/min (by C-G formula based on SCr of 0.89 mg/dL). Recent Labs  Lab 10/11/24 0807 10/11/24 0909 10/11/24 1837 10/12/24 0023  PROCALCITON  --   --   --  0.96  WBC 6.8  --  10.4 13.7*  LATICACIDVEN >9.0* >9.0* 7.0*  --     Liver Function Tests: Recent Labs  Lab 10/11/24 0805 10/12/24 0023  AST 94* 68*  ALT 35 25  ALKPHOS 123 82  BILITOT 0.9 1.0  PROT 7.7 5.4*  ALBUMIN 4.0 2.9*   Recent Labs  Lab 10/11/24 0805  LIPASE 44   No results for input(s): AMMONIA in the last 168 hours.  ABG    Component Value Date/Time   HCO3 21.1 12/17/2020 2252   TCO2 25 01/05/2023 1847   ACIDBASEDEF 2.6 (H) 12/17/2020 2252   O2SAT 35.6 12/17/2020 2252     Coagulation Profile: Recent Labs  Lab 10/11/24 0909  INR 1.2    Cardiac Enzymes: No results for input(s): CKTOTAL, CKMB, CKMBINDEX, TROPONINI in the last 168 hours.  HbA1C: Hgb A1c MFr Bld  Date/Time Value Ref Range Status  10/11/2024 03:27 PM 4.9 4.8 - 5.6 % Final    Comment:    (NOTE) Diagnosis of Diabetes The following HbA1c ranges recommended by the American Diabetes Association (ADA) may be used as an aid in the diagnosis of diabetes mellitus.  Hemoglobin             Suggested A1C NGSP%              Diagnosis  <5.7                   Non Diabetic  5.7-6.4                Pre-Diabetic  >6.4                   Diabetic  <7.0                   Glycemic control for                       adults with diabetes.      CBG: Recent Labs  Lab 10/11/24 0736  GLUCAP 173*    Review of Systems:   Review of Systems  Constitutional:  Positive for malaise/fatigue.  Respiratory:  Negative for shortness of breath.   Cardiovascular:  Negative for chest pain.  Gastrointestinal:  Positive  for nausea and vomiting.  Skin: Negative.   Psychiatric/Behavioral:  Positive for substance abuse.       Past Medical History:  She,  has a past medical history of Alcohol abuse, Asthma, Contracture of muscle of hand, GERD (gastroesophageal reflux disease), HTN (hypertension), PUD (peptic ulcer disease), Seizures (HCC), Sleep apnea, and Stroke (HCC).   Surgical History:   Past Surgical History:  Procedure Laterality Date   ABDOMINAL HYSTERECTOMY  2004   complete   BIOPSY  10/06/2022   Procedure: BIOPSY;  Surgeon: Cindie Carlin POUR, DO;  [facility];  Service: Endoscopy;;   BRAIN SURGERY     age 36 stroke   COLONOSCOPY     COLONOSCOPY WITH PROPOFOL   10/03/2012   Dr. Harvey: moderate diverticulosis, small internal hemorrhoids, TUBULAR ADENOMA. Next colonoscopy 2023 per Dr. harvey.   COLONOSCOPY WITH PROPOFOL  N/A 10/06/2022   Procedure: COLONOSCOPY WITH PROPOFOL ;  Surgeon: Cindie Carlin POUR, DO;  [facility];  Service: Endoscopy;  Laterality: N/A;  9:15 am   ESOPHAGOGASTRODUODENOSCOPY N/A 10/18/2014   Dr. Shaaron during inpatient hospitalization: severe exudative/erosive reflux esophagitis as source of trivial upper GI bleed. No varices    ESOPHAGOGASTRODUODENOSCOPY (EGD) WITH PROPOFOL   10/03/2012   Dr. Harvey: stricture at GE junction s/p dilation, mild gastritis negative H.pylori    ESOPHAGOGASTRODUODENOSCOPY (EGD) WITH PROPOFOL  N/A 12/27/2017   Grade D esophagitis. Medium-sized hiatal hernia, normal duodenum   ESOPHAGOGASTRODUODENOSCOPY (EGD) WITH PROPOFOL  N/A 10/06/2022   Procedure: ESOPHAGOGASTRODUODENOSCOPY (EGD) WITH PROPOFOL ;  Surgeon: Cindie Carlin POUR, DO;  [facility];  Service: Endoscopy;  Laterality: N/A;   POLYPECTOMY  10/06/2022   Procedure: POLYPECTOMY;  Surgeon: Cindie Carlin POUR, DO;  [facility];  Service: Endoscopy;;   right tib-fib fracture  2008   SAVORY DILATION  10/03/2012   Procedure: SAVORY DILATION;  Surgeon: Margo LITTIE Harvey, MD;  Location: [facility];  Service: Endoscopy;  Laterality: N/A;  started at 1303,  dilated  12.8-16mm     Social History:   reports that she quit smoking about 10 years ago. Her smoking use included cigarettes. She started smoking about 19 years ago. She has a 0.4 pack-year smoking history. She has never used smokeless tobacco. She reports current alcohol use of about 35.0 standard drinks of alcohol per week. She reports that she does not use drugs.   Family History:  Her family history includes Liver disease in her sister. There is no history of Colon cancer or Colon polyps.   Allergies Allergies[1]   Home Medications  Prior to Admission medications  Medication Sig Start Date End Date Taking? Authorizing Provider  LINZESS  290 MCG CAPS capsule Take 290 mcg by mouth daily. 10/02/24  Yes [provider]  phenytoin  (DILANTIN ) 100 MG ER capsule Take 100 mg by mouth 4 (four) times daily. 10/02/24  Yes [provider]  potassium chloride  SA (KLOR-CON  M) 20 MEQ tablet Take 20 mEq by mouth daily. 05/05/24  Yes [provider]  acetaminophen  (TYLENOL ) 500 MG tablet Take 500 mg by mouth every 6 (six) hours as needed. As needed for pain    [provider]  albuterol  (VENTOLIN  HFA) 108 (90 Base) MCG/ACT inhaler Inhale 2 puffs into the lungs every 6 (six) hours as needed for wheezing or shortness of breath. 08/06/22   Pearlean Manus, MD  HYDROcodone -acetaminophen  (NORCO/VICODIN) 5-325 MG tablet One tablet every six hours for pain.  Limit 7 days. 07/12/24   Brenna Lin, MD  lisinopril  (ZESTRIL ) 20 MG  tablet Take 1 tablet (20 mg total) by mouth daily. 01/09/23   Vann, Jessica U, DO  pantoprazole  (PROTONIX ) 40 MG tablet Take 1 tablet (40 mg total) by mouth daily for 7 days. 10/09/22 09/07/23  Mesner, Selinda, MD  PHENobarbital  (LUMINAL) 32.4 MG tablet Take 1 tablet (32.4 mg total) by mouth 2 (two) times daily. 08/06/22   Pearlean Manus, MD  phenytoin  (DILANTIN ) 200 MG ER capsule Take 1 capsule (200 mg total) by mouth 2 (two) times daily. Patient taking  differently: Take 100 mg by mouth 3 (three) times daily. 08/06/22   Pearlean Manus, MD  potassium chloride  (KLOR-CON ) 20 MEQ packet Take 20 mEq by mouth 2 (two) times daily. 05/30/23   Raford Lenis, MD  solifenacin (VESICARE) 5 MG tablet Take 5 mg by mouth daily. 11/24/22   [provider]     Critical care time: 40 min      CRITICAL CARE Performed by: Ronnald FORBES Gave   Total critical care time: 40 minutes  Critical care time was exclusive of separately billable procedures and treating other patients. Critical care was necessary to treat or prevent imminent or life-threatening deterioration.  Critical care was time spent personally by me on the following activities: development of treatment plan with patient and/or surrogate as well as nursing, discussions with consultants, evaluation of patient's response to treatment, examination of patient, obtaining history from patient or surrogate, ordering and performing treatments and interventions, ordering and review of laboratory studies, ordering and review of radiographic studies, pulse oximetry and re-evaluation of patient's condition.  Ronnald Gave MSN, AGACNP-BC [place] [facility] Amion for pager  10/12/2024, 3:07 PM          [1] No Known Allergies  "

## 2024-10-12 NOTE — Plan of Care (Signed)
   Problem: Education: Goal: Knowledge of General Education information will improve Description: Including pain rating scale, medication(s)/side effects and non-pharmacologic comfort measures Outcome: Progressing   Problem: Coping: Goal: Level of anxiety will decrease Outcome: Progressing   Problem: Safety: Goal: Ability to remain free from injury will improve Outcome: Progressing

## 2024-10-12 NOTE — Progress Notes (Signed)
 Resources for outpatient and residential substance use and counseling added to pt's AVS.    10/12/24 0841  TOC Brief Assessment  Insurance and Status Reviewed  Patient has primary care physician Yes  Home environment has been reviewed From home  Prior level of function: Independent  Prior/Current Home Services No current home services  Social Drivers of Health Review SDOH reviewed no interventions necessary  Readmission risk has been reviewed Yes  Transition of care needs no transition of care needs at this time     Transition of Care Department [id number]) has reviewed patient and no TOC needs have been identified at this time. We will continue to monitor patient advancement through interdisciplinary progression rounds. If new patient transition needs arise, please place a TOC consult.

## 2024-10-12 NOTE — Consult Note (Signed)
 "  Referring Provider: Ronnald Gave NP Primary Care Physician:  Carlette Benita Area, MD Primary Gastroenterologist:  Sampson, Dr. Carlin Hasty   Reason for Consultation: Cirrhosis, erosive esophagitis and coffee-ground emesis  HPI: [name] is a 68 y.o. female with a past medical history of, hypertension, CVA age 20, seizures, GERD, reflux esophagitis, peptic ulcer disease, colon polyps, alcohol use disorder and alcohol associated cirrhosis.  Patient presented to [facility] 10/11/2024 with altered mental status and coffee-ground emesis.  She was hypotensive therefore she was admitted to the ICU and required pressors.  Started on PPI IV twice daily and Octreotide .  Also started on vancomycin  and cefepime  for possible sepsis.  She was evaluated by GI Dr. Deatrice Dine.  Patient endorsed drinking a significant amount of beer daily.  She endorsed having black stools and vomited black emesis 12/25 concerning for GI bleed.  Patient also endorsed taking Aleve  and Motrin daily.  Labs in the ED showed WBC count of 6.8.  Hemoglobin 11.7 which is baseline.  Hematocrit 33.8.  Platelets 216.  Sodium 134.  Potassium 2.4.  Glucose 157.  BUN 13.  Creatinine 1.33.  Creatinine 0.58 two months ago.  Albumin 4.0.  Total bili 0.9.  Alk phos 123.  AST 94.  ALT 35.  Lipase 44.  Lactic acid > 9.0 -> 7.0.  Blood cultures no growth < 24 hrs. Troponin 38.  Ethyl alcohol 180.  INR 1.2.  MDF 10.6.  Urine drug screen negative.  SARS coronavirus 2 negative.  HIV nonreactive.  Vitamin B12 level 1612.  TSH 0.480.  Chest x-ray showed minimal right basilar subsegmental atelectasis.  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  CTAP with contrast showed a nodular liver contour consistent with cirrhosis, circumferential wall thickening to the distal esophagus with fluid in  the distal esophagus suggestive of reflux esophagitis and left colonic diverticulosis without diverticulitis.  She was transferred to [facility] for ICU admission and for further GI evaluation.  Labs today: WBC 13.7.  Hemoglobin 8.2.  Platelets 120.  Sodium 136.  Potassium 3.9.  BUN 17.  Creatinine 0.89.  Albumin 2.9.  Total bili 1.0.  Alk phos 82.  ALT 25.  Lactic acid 5.6.  She was weaned off Levophed  within the past hour.  She is awake and conversant.  She is a challenging historian.  She endorsed vomiting 2 or 3 times prior to presenting to [facility] ED as noted above.  She stated emesis was black.  She does not recall passing black stools.  She denies having any bright red blood per the rectum.  She endorses drinking several beers daily, exact quantity unclear.  She believes her last alcohol intake was 10/10/2024.  She denies having any recent heartburn.  She is prescribed Pantoprazole  40 mg once daily at home, she cannot verify if she has been taking it.  She has a history of GERD with prior upper GI bleed/hematemesis 10/2014.  EGD at that time identified severe erosive reflux esophagitis, likely source of hematemesis as well as a hiatal hernia.  Her most recent EGD 09/2022 identified grade C erosive esophagitis without bleeding, gastritis and a 3 cm hiatal hernia.  Gastric biopsies were negative for H. pylori.  Colonoscopy 09/2022 identified 1 hyperplastic and 1 tubular adenomatous polyp removed from the colon and diverticulosis.  She lives with her boyfriend.  Her sister is supportive and is  involved with her medical decisions.   MOST RECENT GI PROCEDURES:  EGD 10/06/2022: - 3 cm hiatal hernia.  - LA Grade C erosive esophagitis with no bleeding.  - Mild Schatzki ring.  - Gastritis. Biopsied.  - Normal duodenal bulb, first portion of the duodenum and second portion of the duodenum.  Colonoscopy 10/06/2022: - Diverticulosis in the entire examined colon. - One 5 mm polyp in  the transverse colon, removed with a cold snare. Resected and retrieved.  - One 4 mm polyp in the sigmoid colon, removed with a cold snare. Resected and retrieved.  - The examination was otherwise normal.   A. STOMACH, BIOPSY:  Antral and oxyntic mucosa with hyperemia  No Helicobacter pylori identified   B. COLON, TRANSVERSE, POLYPECTOMY:  Tubular adenoma without high grade dysplasia.   C. COLON, SIGMOID, POLYPECTOMY:  Hyperplastic polyp.   EGD 12/27/2017: - LUQ PAIN DUE TO LA Grade D reflux esophagitis.  - Medium-sized hiatal hernia.   EGD 10/18/2014 secondary to hematemesis: Severe exudative/erosive reflux esophagitis. Markedly inflamed esophageal mucosa involved in this process-likely source of hematemesis. Hiatal hernia.    Past Medical History:  Diagnosis Date   Alcohol abuse    Asthma    Contracture of muscle of hand    right   GERD (gastroesophageal reflux disease)    HTN (hypertension)    PUD (peptic ulcer disease)    remote   Seizures (HCC)    since stroke, no recent seizures   Sleep apnea    Stroke [facility])    age 8, required brain surgery effected right side    Past Surgical History:  Procedure Laterality Date   ABDOMINAL HYSTERECTOMY  2004   complete   BIOPSY  10/06/2022   Procedure: BIOPSY;  Surgeon: Cindie Carlin POUR, DO;  Location: [facility];  Service: Endoscopy;;   BRAIN SURGERY     age 88 stroke   COLONOSCOPY     COLONOSCOPY WITH PROPOFOL   10/03/2012   Dr. Harvey: moderate diverticulosis, small internal hemorrhoids, TUBULAR ADENOMA. Next colonoscopy 2023 per Dr. harvey.   COLONOSCOPY WITH PROPOFOL  N/A 10/06/2022   Procedure: COLONOSCOPY WITH PROPOFOL ;  Surgeon: Cindie Carlin POUR, DO;  Location: [facility];  Service: Endoscopy;  Laterality: N/A;  9:15 am   ESOPHAGOGASTRODUODENOSCOPY N/A 10/18/2014   Dr. Shaaron during inpatient hospitalization: severe exudative/erosive reflux esophagitis as source of trivial upper GI bleed. No varices     ESOPHAGOGASTRODUODENOSCOPY (EGD) WITH PROPOFOL   10/03/2012   Dr. Harvey: stricture at GE junction s/p dilation, mild gastritis negative H.pylori    ESOPHAGOGASTRODUODENOSCOPY (EGD) WITH PROPOFOL  N/A 12/27/2017   Grade D esophagitis. Medium-sized hiatal hernia, normal duodenum   ESOPHAGOGASTRODUODENOSCOPY (EGD) WITH PROPOFOL  N/A 10/06/2022   Procedure: ESOPHAGOGASTRODUODENOSCOPY (EGD) WITH PROPOFOL ;  Surgeon: Cindie Carlin POUR, DO;  Location: [facility];  Service: Endoscopy;  Laterality: N/A;   POLYPECTOMY  10/06/2022   Procedure: POLYPECTOMY;  Surgeon: Cindie Carlin POUR, DO;  Location: [facility];  Service: Endoscopy;;   right tib-fib fracture  2008   SAVORY DILATION  10/03/2012   Procedure: SAVORY DILATION;  Surgeon: Margo LITTIE Harvey, MD;  Location: [facility] ORS;  Service: Endoscopy;  Laterality: N/A;  started at 1303,  dilated 12.8-16mm    Prior to Admission medications  Medication Sig Start Date End Date Taking? Authorizing Provider  acetaminophen  (TYLENOL ) 500 MG tablet Take 500 mg by mouth every 6 (six) hours as needed. As needed for pain    [provider]  albuterol  (VENTOLIN  HFA) 108 (  90 Base) MCG/ACT inhaler Inhale 2 puffs into the lungs every 6 (six) hours as needed for wheezing or shortness of breath. 08/06/22   Pearlean Manus, MD  HYDROcodone -acetaminophen  (NORCO/VICODIN) 5-325 MG tablet One tablet every six hours for pain.  Limit 7 days. 07/12/24   Brenna Lin, MD  LINZESS  290 MCG CAPS capsule Take 290 mcg by mouth daily. 10/02/24   [provider]  lisinopril  (ZESTRIL ) 20 MG tablet Take 1 tablet (20 mg total) by mouth daily. 01/09/23   Vann, Jessica U, DO  pantoprazole  (PROTONIX ) 40 MG tablet Take 1 tablet (40 mg total) by mouth daily for 7 days. 10/09/22 09/07/23  Mesner, Selinda, MD  PHENobarbital  (LUMINAL) 32.4 MG tablet Take 1 tablet (32.4 mg total) by mouth 2 (two) times daily. 08/06/22   Pearlean Manus, MD  phenytoin  (DILANTIN ) 100 MG ER capsule Take 100 mg  by mouth 4 (four) times daily. 10/02/24   [provider]  phenytoin  (DILANTIN ) 200 MG ER capsule Take 1 capsule (200 mg total) by mouth 2 (two) times daily. Patient taking differently: Take 100 mg by mouth 3 (three) times daily. 08/06/22   Pearlean Manus, MD  potassium chloride  (KLOR-CON ) 20 MEQ packet Take 20 mEq by mouth 2 (two) times daily. 05/30/23   Raford Lenis, MD  potassium chloride  SA (KLOR-CON  M) 20 MEQ tablet Take 20 mEq by mouth daily. 05/05/24   [provider]  solifenacin (VESICARE) 5 MG tablet Take 5 mg by mouth daily. 11/24/22   [provider]    Current Facility-Administered Medications  Medication Dose Route Frequency Provider Last Rate Last Admin   acetaminophen  (TYLENOL ) tablet 650 mg  650 mg Oral Q6H PRN Tat, Lenis, MD       Or   acetaminophen  (TYLENOL ) suppository 650 mg  650 mg Rectal Q6H PRN Tat, Lenis, MD       ceFEPIme  (MAXIPIME ) 2 g in sodium chloride  0.9 % 100 mL IVPB  2 g Intravenous Q12H Tat, David, MD   Stopped at 10/12/24 0439   Chlorhexidine  Gluconate Cloth 2 % PADS 6 each  6 each Topical [id number] Evonnie Lenis, MD   6 each at 10/12/24 1430   folic acid  (FOLVITE ) tablet 1 mg  1 mg Oral Daily Tat, David, MD   1 mg at 10/12/24 9091   LORazepam  (ATIVAN ) injection 1 mg  1 mg Intravenous PRN Tat, Lenis, MD       LORazepam  (ATIVAN ) tablet 1-4 mg  1-4 mg Oral Q1H PRN Tat, Lenis, MD       Or   LORazepam  (ATIVAN ) injection 1-4 mg  1-4 mg Intravenous Q1H PRN Tat, Lenis, MD       multivitamin with minerals tablet 1 tablet  1 tablet Oral Daily Tat, David, MD   1 tablet at 10/12/24 9092   norepinephrine  (LEVOPHED ) 4mg  in (0.016 mg/mL) premix infusion  0-40 mcg/min Intravenous Titrated Tat, Lenis, MD 7.5 mL/hr at 10/12/24 1500 2 mcg/min at 10/12/24 1500   octreotide  (SANDOSTATIN ) 500 mcg in sodium chloride  0.9 % 250 mL (2 mcg/mL) infusion  50 mcg/hr Intravenous Continuous Tat, David, MD 25 mL/hr at 10/12/24 1500 50 mcg/hr at 10/12/24 1500    ondansetron  (ZOFRAN ) tablet 4 mg  4 mg Oral Q6H PRN Tat, Lenis, MD       Or   ondansetron  (ZOFRAN ) injection 4 mg  4 mg Intravenous Q6H PRN Tat, David, MD   4 mg at 10/11/24 2203   pantoprazole  (PROTONIX ) injection 40 mg  40 mg  Intravenous Q12H Tat, David, MD   40 mg at 10/12/24 9143   PHENobarbital  (LUMINAL) tablet 32.4 mg  32.4 mg Oral BID Tat, Alm, MD   32.4 mg at 10/12/24 9092   phenytoin  (DILANTIN ) ER capsule 100 mg  100 mg Oral TID Evonnie Alm, MD   100 mg at 10/12/24 9092   sodium chloride  flush (NS) 0.9 % injection 10-40 mL  10-40 mL Intracatheter Q12H Tat, David, MD   30 mL at 10/12/24 9091   sodium chloride  flush (NS) 0.9 % injection 10-40 mL  10-40 mL Intracatheter PRN Tat, Alm, MD       thiamine  (VITAMIN B1) tablet 100 mg  100 mg Oral Daily Tat, David, MD   100 mg at 10/12/24 0908   Or   thiamine  (VITAMIN B1) injection 100 mg  100 mg Intravenous Daily Tat, Alm, MD        Allergies as of 10/11/2024   (No Known Allergies)    Family History  Problem Relation Age of Onset   Liver disease Sister        etoh   Colon cancer Neg Hx    Colon polyps Neg Hx     Social History   Socioeconomic History   Marital status: Widowed    Spouse name: Not on file   Number of children: 3   Years of education: Not on file   Highest education level: Not on file  Occupational History   Occupation: disability    Employer: UNEMPLOYED  Tobacco Use   Smoking status: Former    Current packs/day: 0.00    Average packs/day: (0.4 ttl pk-yrs)    Types: Cigarettes    Start date: 07/28/2005    Quit date: 07/28/2014    Years since quitting: 10.2   Smokeless tobacco: Never  Vaping Use   Vaping status: Never Used  Substance and Sexual Activity   Alcohol use: Yes    Alcohol/week: 35.0 standard drinks of alcohol    Types: 35 Cans of beer per week    Comment: Patient reports she drinks 6-8 beers each day   Drug use: No   Sexual activity: Yes    Birth control/protection: Surgical  Other  Topics Concern   Not on file  Social History Narrative   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 HAS 3 KIDS: ONE IN [place], ONE IN [place], AND ONE OOT.   Social Drivers of Health   Tobacco Use: Medium Risk (10/11/2024)   Patient History    Smoking Tobacco Use: Former    Smokeless Tobacco Use: Never    Passive Exposure: Not on file  Financial Resource Strain: Not on file  Food Insecurity: No Food Insecurity (10/11/2024)   Epic    Worried About Programme Researcher, Broadcasting/film/video in the Last Year: Never true    Ran Out of Food in the Last Year: Never true  Transportation Needs: No Transportation Needs (10/11/2024)   Epic    Lack of Transportation (Medical): No    Lack of Transportation (Non-Medical): No  Physical Activity: Not on file  Stress: Not on file  Social Connections: Moderately Isolated (10/11/2024)   Social Connection and Isolation Panel    Frequency of Communication with Friends and Family: More than three times a week    Frequency of Social Gatherings with Friends and Family: More than three times a week    Attends Religious Services: Never    Database Administrator or Organizations: No    Attends Club or  Organization Meetings: Never    Marital Status: Married  Catering Manager Violence: Not At Risk (10/11/2024)   Epic    Fear of Current or Ex-Partner: No    Emotionally Abused: No    Physically Abused: No    Sexually Abused: No  Depression (PHQ2-9): Not on file  Alcohol Screen: Not on file  Housing: Low Risk (10/11/2024)   Epic    Unable to Pay for Housing in the Last Year: No    Number of Times Moved in the Last Year: 0    Homeless in the Last Year: No  Utilities: Not At Risk (10/11/2024)   Epic    Threatened with loss of utilities: No  Health Literacy: Not on file    Review of Systems: Gen: Denies fever, sweats or chills. No weight loss.  CV: Denies chest pain, palpitations or edema. Resp: Denies cough, shortness of breath of hemoptysis.  GI: See HPI.  GU : Denies urinary  burning, blood in urine, increased urinary frequency or incontinence. MS: Denies joint pain, muscles aches or weakness. Derm: Denies rash, itchiness, skin lesions or unhealing ulcers. Psych: + Memory issues.  Heme: Denies easy bruising, bleeding. Neuro:  Denies headaches, dizziness or paresthesias. Endo:  + Dm type II.   Physical Exam: Vital signs in last 24 hours: Temp:  [98.5 F (36.9 C)-99.2 F (37.3 C)] 98.7 F (37.1 C) (12/26 1424) Pulse Rate:  [95-138] 99 (12/26 1545) Resp:  [12-24] 15 (12/26 1545) BP: (80-136)/(34-73) 109/57 (12/26 1545) SpO2:  [90 %-100 %] 98 % (12/26 1545) Weight:  [62.2 kg] 62.2 kg (12/26 1430) Last BM Date : 10/11/24 General:  Alert 68 year old female in no acute distress. Head:  Normocephalic and atraumatic. Eyes:  No scleral icterus. Conjunctiva pink. Ears:  Normal auditory acuity. Nose:  No deformity, discharge or lesions. Mouth: Poor dentition.  No ulcers or lesions.  Neck:  Supple. No lymphadenopathy or thyromegaly.  Lungs: Breath sounds clear throughout. No wheezes, rhonchi or crackles.  Heart: Regular rate and rhythm, no murmurs. Abdomen:   Rectal: Deferred. Musculoskeletal:  Symmetrical without gross deformities.  Pulses:  Normal pulses noted. Extremities:  Without clubbing or edema. Neurologic:  Alert to name.  She knew yesterday was Christmas but could not recall the year.  She stated she was in [place]. Skin:  Intact without significant lesions or rashes. Psych:  Alert and cooperative. Normal mood and affect.  Intake/Output from previous day: 12/25 0701 - 12/26 0700 In: 3015.5 [I.V.:486.3; IV Piggyback:2529.2] Out: -  Intake/Output this shift: Total I/O In: 4510.9 [I.V.:3415.7; IV Piggyback:1095.2] Out: 800 [Urine:800]  Lab Results: Recent Labs    10/11/24 0807 10/11/24 1837 10/12/24 0023  WBC 6.8 10.4 13.7*  HGB 11.7* 9.3* 8.2*  HCT 33.8* 27.1* 23.5*  PLT 216 129* 120*   BMET Recent Labs    10/11/24 0805  10/11/24 1837 10/12/24 0023  NA 134*  --  136  K 2.4* 3.9 3.9  CL 80*  --  96*  CO2 26  --  28  GLUCOSE 157*  --  132*  BUN 13  --  17  CREATININE 1.33*  --  0.89  CALCIUM 9.3  --  8.1*   LFT Recent Labs    10/12/24 0023  PROT 5.4*  ALBUMIN 2.9*  AST 68*  ALT 25  ALKPHOS 82  BILITOT 1.0   PT/INR Recent Labs    10/11/24 0909  LABPROT 15.4*  INR 1.2   Hepatitis Panel No results for input(s): HEPBSAG, HCVAB,  HEPAIGM, HEPBIGM in the last 72 hours.  MELD 3.0: 11 at 10/12/2024 12:23 AM MELD-Na: 8 at 10/12/2024 12:23 AM Calculated from: Serum Creatinine: 0.89 mg/dL (Using min of 1 mg/dL) at 87/73/7974 87:76 AM Serum Sodium: 136 mmol/L at 10/12/2024 12:23 AM Total Bilirubin: 1 mg/dL at 87/73/7974 87:76 AM Serum Albumin: 2.9 g/dL at 87/73/7974 87:76 AM INR(ratio): 1.2 at 10/11/2024  9:09 AM Age at listing (hypothetical): 70 years Sex: Female at 10/12/2024 12:23 AM  Studies/Results: US  EKG SITE RITE Result Date: 10/11/2024 If Site Rite image not attached, placement could not be confirmed due to current cardiac rhythm.  CT ABDOMEN PELVIS W CONTRAST Result Date: 10/11/2024 CLINICAL DATA:  Abdominal pain.  Vomiting blood. EXAM: CT ABDOMEN AND PELVIS WITH CONTRAST TECHNIQUE: Multidetector CT imaging of the abdomen and pelvis was performed using the standard protocol following bolus administration of intravenous contrast. RADIATION DOSE REDUCTION: This exam was performed according to the departmental dose-optimization program which includes automated exposure control, adjustment of the mA and/or kV according to patient size and/or use of iterative reconstruction technique. CONTRAST:  80mL OMNIPAQUE  IOHEXOL  300 MG/ML  SOLN COMPARISON:  08/08/2024 FINDINGS: Lower chest: Dependent atelectasis noted right lung base. Circumferential wall thickening noted distal esophagus. Hepatobiliary: Nodular liver contour is compatible cirrhosis. There is no evidence for gallstones,  gallbladder wall thickening, or pericholecystic fluid. No intrahepatic or extrahepatic biliary dilation. Pancreas: No focal mass lesion. No dilatation of the main duct. No intraparenchymal cyst. No peripancreatic edema. Spleen: No splenomegaly. No suspicious focal mass lesion. Adrenals/Urinary Tract: No adrenal nodule or mass. Kidneys unremarkable. No evidence for hydroureter. The urinary bladder appears normal for the degree of distention. Stomach/Bowel: Stomach is distended with fluid and gas. Esophagogastric junction is patent with fluid noted in the distal esophagus suggesting reflux. Duodenum is normally positioned as is the ligament of Treitz. No small bowel wall thickening. No small bowel dilatation. The terminal ileum is normal. The appendix is normal. No gross colonic mass. No colonic wall thickening. Diverticular changes are noted in the left colon without evidence of diverticulitis. Vascular/Lymphatic: There is mild atherosclerotic calcification of the abdominal aorta without aneurysm. There is no gastrohepatic or hepatoduodenal ligament lymphadenopathy. No retroperitoneal or mesenteric lymphadenopathy. No pelvic sidewall lymphadenopathy. Reproductive: Hysterectomy.  There is no adnexal mass. Other: No intraperitoneal free fluid. Musculoskeletal: No worrisome lytic or sclerotic osseous abnormality. Degenerative changes are noted in the hips bilaterally, left greater than right. IMPRESSION: 1. No acute findings in the abdomen or pelvis. Specifically, no findings to explain the patient's history of abdominal pain and vomiting. 2. Nodular liver contour compatible with cirrhosis. 3. Circumferential wall thickening distal esophagus with fluid in the distal esophagus suggesting reflux. Imaging features compatible with esophagitis. Given the history of vomiting blood, upper endoscopy recommended to exclude neoplasm. 4. Left colonic diverticulosis without diverticulitis. 5.  Aortic Atherosclerosis (ICD10-I70.0).  Electronically Signed   By: Camellia Candle M.D.   On: 10/11/2024 11:58   CT Head Wo Contrast Result Date: 10/11/2024 EXAM: CT HEAD WITHOUT CONTRAST 10/11/2024 11:35:24 AM TECHNIQUE: CT of the head was performed without the administration of intravenous contrast. Automated exposure control, iterative reconstruction, and/or weight based adjustment of the mA/kV was utilized to reduce the radiation dose to as low as reasonably achievable. COMPARISON: Head CT 08/08/2024. CLINICAL HISTORY: 68 year old female with vomiting blood and hypotension. FINDINGS: BRAIN AND VENTRICLES: No acute hemorrhage. No evidence of acute infarct. No extra-axial collection. No mass effect or midline shift. Chronic calcified atherosclerosis and chronic calcified aneurysm in the  right internal carotid artery (ICA) terminus region with adjacent aneurysm clip, 11 mm diameter and stable since 2021. Superimposed multifocal chronic cerebral encephalomalacia, pronounced throughout the anterior and superior left frontal lobe, at the right inferior frontal gyrus. Chronic ex vacuo ventricular enlargement. Stable brain volume. No suspicious intracranial vascular hyperdensity. ORBITS: No acute abnormality. SINUSES: Paranasal sinuses, tympanic cavities and mastoids are well aerated. SOFT TISSUES AND SKULL: No acute soft tissue abnormality. Heterogeneity of the calvarium appears stable since 2021 and most likely benign. No acute or suspicious bone lesion. Chronic severe upper cervical spine degeneration partially visible. IMPRESSION: 1. No acute intracranial abnormality. 2. 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 Electronically signed by: Helayne Hurst MD 10/11/2024 11:40 AM EST RP Workstation: [id number]   DG Chest Portable 1 View Result Date: 10/11/2024 CLINICAL DATA:  Possible aspiration.  Hematemesis.  Hypotension. EXAM: PORTABLE CHEST 1 VIEW COMPARISON:   11/29/2021 and CT chest 08/08/2024. FINDINGS: Patient is rotated. Trachea is midline. Heart size stable. There may be minimal right basilar subsegmental atelectasis. Lungs are otherwise clear. No pleural fluid. No pneumothorax. Osteopenia. IMPRESSION: Minimal right basilar subsegmental atelectasis. Electronically Signed   By: Newell Eke M.D.   On: 10/11/2024 09:06    IMPRESSION/PLAN:  68 year old female with a history of alcohol associated cirrhosis transferred from [facility] for further management of upper GI bleed/hematemesis.  No history of esophageal or gastric varices. CTAP with contrast showed a nodular liver contour consistent with cirrhosis. MELD 3.0: 11. Hg 11.7 -> 9.3 -> 8.2.  Weaned off Levophed . - NPO - IV fluids per CCM - Continue Octreotide  infusion 50 mcg/hour - Continue Pantoprazole  40 mg IV twice daily - Ondansetron  4 mg PR IV every 6 hours as needed - Transfuse for hemoglobin level less than 7 - CIWA protocol in process - Alcohol abstinence recommended  - Defer endoscopic recommendations to Dr. Suzann  Normocytic anemia, secondary to suspected UGI bleed. Aggressive IV hydration likely a contributing factor.  Hg 11.7 -> 9.3 -> 8.2.  - CBC in a.m. - See plan above  History of GERD, severe erosive reflux esophagitis/upper GI bleed in 2016 and a 3 cm hiatal hernia -  See plan above   Lactic acidosis, improving  History of CVA age 26  Seizure disorder on Dilantin .   [name]  10/12/2024, 5:05 PM      "

## 2024-10-12 NOTE — Consult Note (Addendum)
 Jeffrie Stander Faizan Naphtali Riede, M.D. Gastroenterology & Hepatology                                           Patient Name: [name]  MRN: [id number] Admission Date: 10/11/2024 Date of Evaluation:  10/12/2024 Time of Evaluation: 2:42 PM  Chief Complaint:  Altered mental status , coffee ground emesis    HPI:  This is a 68 y.o. female with history of f alcoholic cirrhosis, anemia, reflux esophagitis, HTN, peptic ulcer disease, seizures, stroke, GERD and gallbladder polyp presented with altered mental status and coffee-ground emesis.  GI was consulted for upper GI bleed in setting of cirrhosis  Patient was seen in the ICU today initially was altered but after starting pressors she is more awake alert and conversing appropriately.  Patient reports that she has been drinking significant amount of alcohol beer daily.  Reports she had black-colored stools yesterday and vomited black material upon being admitted in the hospital.  No further episodes in last 24 hours. Patient reports she has been taking all sort of  painkillers including Aleve  and Motrin daily  Patient presented with hypotension which was initially responsive to fluid resuscitation but again became hypotensive this morning with elevated lactate and was started on pressors  Labs AST more than ALT 64/25 T. bili 1.0 Lactate 7 Hemoglobin baseline 10-11---->9.3-->8.2  Chest x-ray with atelectasis CT head with encephalomalacia right ICA aneurysm CT abdomen pelvis with contrast nodular liver consistent with cirrhosis, distal esophageal thickening concerning for esophagitis/neoplasm  Endoscopy history  2023:Egd/Colonoscopy  - 3 cm hiatal hernia. - LA Grade C erosive esophagitis with no bleeding. - Mild Schatzki ring. - Gastritis. Biopsied. - Normal duodenal bulb, first portion of the duodenum and second portion of the  - Diverticulosis in the entire examined colon. - One 5 mm polyp in the transverse colon, removed with a cold  snare. Resected and - One 4 mm polyp in the sigmoid colon, removed with a cold snare. Resected and retrieved. - The examination was otherwise normal.  colonoscopy in December 2013 with moderate diverticulosis noted throughout the entire colon, small internal hemorrhoids, otherwise normal.  Advise repeat in 10 years.   Last EGD in March 2019 with reflux esophagitis, medium size hiatal hernia.  Past Medical History: SEE CHRONIC ISSSUES: Past Medical History:  Diagnosis Date   Alcohol abuse    Asthma    Contracture of muscle of hand    right   GERD (gastroesophageal reflux disease)    HTN (hypertension)    PUD (peptic ulcer disease)    remote   Seizures (HCC)    since stroke, no recent seizures   Sleep apnea    Stroke [facility])    age 89, required brain surgery effected right side   Past Surgical History:  Past Surgical History:  Procedure Laterality Date   ABDOMINAL HYSTERECTOMY  2004   complete   BIOPSY  10/06/2022   Procedure: BIOPSY;  Surgeon: Cindie Carlin POUR, DO;  Location: [facility];  Service: Endoscopy;;   BRAIN SURGERY     age 55 stroke   COLONOSCOPY     COLONOSCOPY WITH PROPOFOL   10/03/2012   Dr. Harvey: moderate diverticulosis, small internal hemorrhoids, TUBULAR ADENOMA. Next colonoscopy 2023 per Dr. harvey.   COLONOSCOPY WITH PROPOFOL  N/A 10/06/2022   Procedure: COLONOSCOPY WITH PROPOFOL ;  Surgeon: Cindie Carlin POUR, DO;  Location: [facility];  Service: Endoscopy;  Laterality: N/A;  9:15 am   ESOPHAGOGASTRODUODENOSCOPY N/A 10/18/2014   Dr. Shaaron during inpatient hospitalization: severe exudative/erosive reflux esophagitis as source of trivial upper GI bleed. No varices    ESOPHAGOGASTRODUODENOSCOPY (EGD) WITH PROPOFOL   10/03/2012   Dr. Harvey: stricture at GE junction s/p dilation, mild gastritis negative H.pylori    ESOPHAGOGASTRODUODENOSCOPY (EGD) WITH PROPOFOL  N/A 12/27/2017   Grade D esophagitis. Medium-sized hiatal hernia, normal duodenum    ESOPHAGOGASTRODUODENOSCOPY (EGD) WITH PROPOFOL  N/A 10/06/2022   Procedure: ESOPHAGOGASTRODUODENOSCOPY (EGD) WITH PROPOFOL ;  Surgeon: Cindie Carlin POUR, DO;  Location: [facility];  Service: Endoscopy;  Laterality: N/A;   POLYPECTOMY  10/06/2022   Procedure: POLYPECTOMY;  Surgeon: Cindie Carlin POUR, DO;  Location: [facility];  Service: Endoscopy;;   right tib-fib fracture  2008   SAVORY DILATION  10/03/2012   Procedure: SAVORY DILATION;  Surgeon: Margo LITTIE Harvey, MD;  Location: [facility];  Service: Endoscopy;  Laterality: N/A;  started at 1303,  dilated 12.8-16mm   Family History:  Family History  Problem Relation Age of Onset   Liver disease Sister        etoh   Colon cancer Neg Hx    Colon polyps Neg Hx    Social History: Social History[1]  Home Medications:  Prior to Admission medications  Medication Sig Start Date End Date Taking? Authorizing Provider  acetaminophen  (TYLENOL ) 500 MG tablet Take 500 mg by mouth every 6 (six) hours as needed. As needed for pain    [provider]  albuterol  (VENTOLIN  HFA) 108 (90 Base) MCG/ACT inhaler Inhale 2 puffs into the lungs every 6 (six) hours as needed for wheezing or shortness of breath. 08/06/22   Pearlean Manus, MD  HYDROcodone -acetaminophen  (NORCO/VICODIN) 5-325 MG tablet One tablet every six hours for pain.  Limit 7 days. 07/12/24   Brenna Lin, MD  lisinopril  (ZESTRIL ) 20 MG tablet Take 1 tablet (20 mg total) by mouth daily. 01/09/23   Vann, Jessica U, DO  pantoprazole  (PROTONIX ) 40 MG tablet Take 1 tablet (40 mg total) by mouth daily for 7 days. 10/09/22 09/07/23  Mesner, Selinda, MD  PHENobarbital  (LUMINAL) 32.4 MG tablet Take 1 tablet (32.4 mg total) by mouth 2 (two) times daily. 08/06/22   Pearlean Manus, MD  phenytoin  (DILANTIN ) 200 MG ER capsule Take 1 capsule (200 mg total) by mouth 2 (two) times daily. Patient taking differently: Take 100 mg by mouth 3 (three) times daily. 08/06/22   Pearlean Manus, MD  potassium  chloride (KLOR-CON ) 20 MEQ packet Take 20 mEq by mouth 2 (two) times daily. 05/30/23   Raford Lenis, MD  solifenacin (VESICARE) 5 MG tablet Take 5 mg by mouth daily. 11/24/22   [provider]    Inpatient Medications: Current Medications[2] Allergies: Patient has no known allergies.  Complete Review of Systems: GENERAL: negative for malaise, night sweats HEENT: No changes in hearing or vision, no nose bleeds or other nasal problems. NECK: Negative for lumps, goiter, pain and significant neck swelling RESPIRATORY: Negative for cough, wheezing CARDIOVASCULAR: Negative for chest pain, leg swelling, palpitations, orthopnea GI: SEE HPI MUSCULOSKELETAL: Negative for joint pain or swelling, back pain, and muscle pain. SKIN: Negative for lesions, rash PSYCH: Negative for sleep disturbance, mood disorder and recent psychosocial stressors. HEMATOLOGY Negative for prolonged bleeding, bruising easily, and swollen nodes. ENDOCRINE: Negative for cold or heat intolerance, polyuria, polydipsia and goiter. NEURO: negative for tremor, gait imbalance, syncope and seizures. The remainder of the review of systems is  noncontributory.  Physical Exam: BP (!) 113/49   Pulse (!) 107   Temp 98.7 F (37.1 C) (Oral)   Resp (!) 21   Ht 5' 3 (1.6 m)   Wt 59.3 kg   SpO2 100%   BMI 23.16 kg/m  GENERAL: The patient is AO x2, in no acute distress. HEENT: Head is normocephalic and atraumatic. EOMI are intact. Mouth is well hydrated and without lesions. NECK: Supple. No masses LUNGS: Clear to auscultation. No presence of rhonchi/wheezing/rales. Adequate chest expansion HEART: RRR, normal s1 and s2. ABDOMEN: Soft, nontender, no guarding, no peritoneal signs, and nondistended. BS +. No masses.  Laboratory Data CBC:     Component Value Date/Time   WBC 13.7 (H) 10/12/2024 0023   RBC 2.66 (L) 10/12/2024 0023   HGB 8.2 (L) 10/12/2024 0023   HCT 23.5 (L) 10/12/2024 0023   PLT 120 (L) 10/12/2024 0023    MCV 88.3 10/12/2024 0023   MCH 30.8 10/12/2024 0023   MCHC 34.9 10/12/2024 0023   RDW 15.5 10/12/2024 0023   LYMPHSABS 0.7 10/11/2024 0807   MONOABS 0.4 10/11/2024 0807   EOSABS 0.0 10/11/2024 0807   BASOSABS 0.0 10/11/2024 0807   COAG:  Lab Results  Component Value Date   INR 1.2 10/11/2024   INR 1.2 01/05/2023   INR 1.1 09/23/2022    BMP:     Latest Ref Rng & Units 10/12/2024   12:23 AM 10/11/2024    6:37 PM 10/11/2024    8:05 AM  BMP  Glucose 70 - 99 mg/dL 867   842   BUN 8 - 23 mg/dL 17   13   Creatinine 9.55 - 1.00 mg/dL 9.10   8.66   Sodium 864 - 145 mmol/L 136   134   Potassium 3.5 - 5.1 mmol/L 3.9  3.9  2.4   Chloride 98 - 111 mmol/L 96   80   CO2 22 - 32 mmol/L 28   26   Calcium 8.9 - 10.3 mg/dL 8.1   9.3     HEPATIC:     Latest Ref Rng & Units 10/12/2024   12:23 AM 10/11/2024    8:05 AM 08/08/2024   12:13 AM  Hepatic Function  Total Protein 6.5 - 8.1 g/dL 5.4  7.7  7.6   Albumin 3.5 - 5.0 g/dL 2.9  4.0  4.0   AST 15 - 41 U/L 68  94  67   ALT 0 - 44 U/L 25  35  25   Alk Phosphatase 38 - 126 U/L 82  123  143   Total Bilirubin 0.0 - 1.2 mg/dL 1.0  0.9  0.4     CARDIAC:  Lab Results  Component Value Date   CKTOTAL 78 07/30/2016   TROPONINI <0.03 01/22/2019     Imaging: I personally reviewed and interpreted the available imaging.  Assessment & Plan:   This is a 68 y.o. female with history of f alcoholic cirrhosis, anemia, reflux esophagitis, HTN, peptic ulcer disease, seizures, stroke, GERD and gallbladder polyp presented with altered mental status and coffee-ground emesis.  GI was consulted for upper GI bleed in setting of cirrhosis  #Coffee ground emesis   Patient presented with coffee-ground emesis but have not had any melena or overt bleeding for past 24-hour.  Despite this she became hypotensive requiring pressor support, elevated lactate.   Patient presented with upper GI bleed likely from esophagitis ,peptic ulcer disease less likely  variceal bleed  Even though patient is cirrhotic  her presentation is not typical of active variceal bleed as she have not had any large amount of overt bleeding in the hospital  Patient has been taking significant NSAIDs and this could be peptic ulcer disease  Plan was for upper endoscopy today but patient became hemodynamically unstable requiring pressor support, after discussion with anesthesia procedure was deferred as patient required ICU level care  Recs:   -Would recommend upper endoscopy when patient is hemodynamically stable and off pressor support unless patient has significant overt bleeding  Maintain 2 large-bore IV lines Ceftriaxone  as patient is cirrhotic and GI bleeder Octreotide  PPI twice daily CBC Q6 and transfuse to keep HBG more than 7 Continue ICU management for shock  #Cirrhosis MELD 3.0: 11 at 10/12/2024 12:23 AM MELD-Na: 8 at 10/12/2024 12:23 AM Calculated from: Serum Creatinine: 0.89 mg/dL (Using min of 1 mg/dL) at 87/73/7974 87:76 AM Serum Sodium: 136 mmol/L at 10/12/2024 12:23 AM Total Bilirubin: 1 mg/dL at 87/73/7974 87:76 AM Serum Albumin: 2.9 g/dL at 87/73/7974 87:76 AM INR(ratio): 1.2 at 10/11/2024  9:09 AM Age at listing (hypothetical): 68 years Sex: Female at 10/12/2024 12:23 AM  #Eitology : Alcohol use disorder, last viral profile from 2023 negative had B surface antigen and negative hep C antibody  # Hepatic encephalopathy - Titrate lactulose  to achieve 2-3 Bms per day  - Avoid opiates or benzodiazepines   # Ascites; none on imaging  - Low sodium diet  # Esophageal varices - Last EGD 2023 without varices  Platelets less than 150 concerning for clinically significant portal hypertension but imaging without splenomegaly or varices  # HCC screening - Last abdominal imaging - Last AFP: 2018   - Check AFP - Continue care for cirrhosis as outpatient  # Normocytic anemia   -Check iron profile   #Elevated liver enzymes Maddrey  Discriminant Function : 7.4  AST more than ALT typical of alcohol induced injury  No indication for steroid for treatment of alcohol induced hepatitis Daily liver enzyme and INR Absolute alcohol cessation advised to patient today  Tobie Hellen Faizan Glen Blatchley, MD Gastroenterology and Hepatology [facility]  This chart has been completed using Dragon Medical Dictation software, and while attempts have been made to ensure accuracy , certain words and phrases may not be transcribed as intended      [1]  Social History Tobacco Use   Smoking status: Former    Current packs/day: 0.00    Average packs/day: (0.4 ttl pk-yrs)    Types: Cigarettes    Start date: 07/28/2005    Quit date: 07/28/2014    Years since quitting: 10.2   Smokeless tobacco: Never  Vaping Use   Vaping status: Never Used  Substance Use Topics   Alcohol use: Yes    Alcohol/week: 35.0 standard drinks of alcohol    Types: 35 Cans of beer per week    Comment: Patient reports she drinks 6-8 beers each day   Drug use: No  [2]  Current Facility-Administered Medications:    acetaminophen  (TYLENOL ) tablet 650 mg, 650 mg, Oral, Q6H PRN **OR** acetaminophen  (TYLENOL ) suppository 650 mg, 650 mg, Rectal, Q6H PRN, Tat, David, MD   ceFEPIme  (MAXIPIME ) 2 g in sodium chloride  0.9 % 100 mL IVPB, 2 g, Intravenous, Q12H, Evonnie Lenis, MD, Stopped at 10/12/24 0439   Chlorhexidine  Gluconate Cloth 2 % PADS 6 each, 6 each, Topical, Q0600, Tat, David, MD   folic acid  (FOLVITE ) tablet 1 mg, 1 mg, Oral, Daily, Tat, David, MD, 1 mg at 10/12/24  0908   LORazepam  (ATIVAN ) injection 1 mg, 1 mg, Intravenous, PRN, Tat, David, MD   LORazepam  (ATIVAN ) tablet 1-4 mg, 1-4 mg, Oral, Q1H PRN **OR** LORazepam  (ATIVAN ) injection 1-4 mg, 1-4 mg, Intravenous, Q1H PRN, Tat, David, MD   multivitamin with minerals tablet 1 tablet, 1 tablet, Oral, Daily, Tat, David, MD, 1 tablet at 10/12/24 0907   norepinephrine  (LEVOPHED ) 4mg  in  (0.016 mg/mL) premix infusion, 0-40 mcg/min, Intravenous, Titrated, Tat, Alm, MD, Last Rate: 22.5 mL/hr at 10/12/24 1257, 6 mcg/min at 10/12/24 1257   [COMPLETED] octreotide  (SANDOSTATIN ) 2 mcg/mL load via infusion 50 mcg, 50 mcg, Intravenous, Once, 50 mcg at 10/11/24 1452 **AND** octreotide  (SANDOSTATIN ) 500 mcg in sodium chloride  0.9 % 250 mL (2 mcg/mL) infusion, 50 mcg/hr, Intravenous, Continuous, Tat, David, MD, Last Rate: 25 mL/hr at 10/12/24 1257, 50 mcg/hr at 10/12/24 1257   ondansetron  (ZOFRAN ) tablet 4 mg, 4 mg, Oral, Q6H PRN **OR** ondansetron  (ZOFRAN ) injection 4 mg, 4 mg, Intravenous, Q6H PRN, Tat, Alm, MD, 4 mg at 10/11/24 2203   pantoprazole  (PROTONIX ) injection 40 mg, 40 mg, Intravenous, Q12H, Tat, Alm, MD, 40 mg at 10/12/24 9143   PHENobarbital  (LUMINAL) tablet 32.4 mg, 32.4 mg, Oral, BID, Tat, David, MD, 32.4 mg at 10/12/24 9092   phenytoin  (DILANTIN ) ER capsule 100 mg, 100 mg, Oral, TID, Tat, David, MD, 100 mg at 10/12/24 9092   sodium chloride  flush (NS) 0.9 % injection 10-40 mL, 10-40 mL, Intracatheter, Q12H, Tat, David, MD, 30 mL at 10/12/24 0908   sodium chloride  flush (NS) 0.9 % injection 10-40 mL, 10-40 mL, Intracatheter, PRN, Tat, David, MD   thiamine  (VITAMIN B1) tablet 100 mg, 100 mg, Oral, Daily, 100 mg at 10/12/24 0908 **OR** thiamine  (VITAMIN B1) injection 100 mg, 100 mg, Intravenous, Daily, Tat, David, MD   vancomycin  (VANCOCIN ) IVPB 1000 mg/200 mL premix, 1,000 mg, Intravenous, Q24H, Tanda Dempsey SAUNDERS, [facility]

## 2024-10-12 NOTE — Progress Notes (Addendum)
 "          PROGRESS NOTE  [name] FMW:[id number] DOB: January 04, 1956 DOA: 10/11/2024 PCP: Carlette Benita Area, MD  Brief History:  68 year old female with a history of IBS, asthma, stroke, seizure disorder, peptic ulcer disease, alcohol abuse, hypertension presenting with altered mental status.  The patient was found lying on the floor with dark emesis next to her at home.  Patient does not recall any events from last evening.  Family states that the patient has been drinking an abundance of alcohol on the evening of 10/10/2024.  She continues to drink daily.  There was some question whether her emesis was bloody because of the color of the carpet.  Nevertheless, the patient was somnolent upon arrival and hypotensive.  She became more responsive after fluids and antibiotics, but remained slow in responding.  The patient denies any illicit drug use but states that she drinks daily.  She denies tobacco use.  She denies any chest pain, shortness breath, headache, neck pain.  She complains of abdominal pain.  She states that she has chronic abdominal pain.  She denies any hematochezia or melena.  She denies any dysuria. In the ED, the patient was afebrile.  She was initially hypotensive with a blood pressure of 62/44.  She was fluid resuscitated with 2.5 L of fluids.  He was started on ceftriaxone  and metronidazole .  Her blood pressure improved.  WBC 6.8, hemoglobin 1.7, platelets 216.  AST 94, ALT 35, alk phosphatase 123, total bilirubin 0.9.  Sodium 134, potassium 2.4, bicarbonate 26, serum creatinine 1.33.  Lipase 44.  CT abdomen and pelvis was negative for any acute findings.  Chest x-ray was negative for any acute findings.  CT of the brain was negative for any acute findings but showed previous aneurysmal clip.  EKG showed sinus tachycardia.  Troponin 38.  Lactic acid >9 Patient was admitted for further evaluation of her SIRS picture. The patient received 4.5 L of boluses and IV fluids.   Unfortunately, the patient remained hypotensive.  She required Levophed .  GI was consulted.  She was started on octreotide  infusion and pantoprazole  twice daily.  She was started on empiric vancomycin  and cefepime . Because of her continued hypotension GI and anesthesia felt that the patient needed a higher level care for management.  Critical care was contacted.   Assessment/Plan: Acute metabolic encephalopathy - Multifactorial including alcohol intoxication, infectious process, acute kidney injury - B12--1612 - Folic acid --6.7>>replete - TSH--0.480 - CT brain negative - Obtain UA--no pyuria - 12/26--mental status improved  Hypotension -started on levophed >>7 mcg/kg/min early am 12/26 -multifactorial including infx, GIB, volume depletion   Alcoholic hepatitis - Monitor LFTs - due to alcoholic hepatitis   Esophageal wall thickening/peptic ulcer disease/hematemesis - Patient has questionable hematemesis - GI consulted--not stable to perform EGD presently>>transfer to higher level of care - Pantoprazole  twice daily - 10/06/2022 EGD--grade C esophagitis, mild Schatzki's ring, gastritis - started on octreotide    Acute kidney injury - Secondary to volume depletion - Baseline creatinine 0.6-0.8 - Presented with serum creatinine 1.33 - Continue IV fluids>>improving   Lactic acidosis - Multifactorial including delayed hepatic clearance, alcoholic ketosis, AKI, and possible infectious process - Continue IV fluids>>improving   Hypokalemia/hypomagnesemia - Repleted   Asthma - Continue bronchodilators as needed   History of seizures - Check Dilantin  and phenobarb levels>>undetectable - later in admission, pt endorsed not taking her meds for a couple of weeks - Continue phenobarbital  and Dilantin >>   Stroke at age 66 (required  craniotomy)  -CT head without new acute findings however there is evidence of Atrophy, small-vessel disease and bifrontal/left temporal encephalomalacia,  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         Family Communication:  spouse updated  Consultants:  GI, CCM  Code Status:  FULL  DVT Prophylaxis:  SCDs   Procedures: As Listed in Progress Note Above  Antibiotics: Vanc 12/25>> Cefepime  12/25>>   The patient is critically ill with multiple organ systems failure and requires high complexity decision making for assessment and support, frequent evaluation and titration of therapies, application of advanced monitoring technologies and extensive interpretation of multiple databases.  Critical care time - 35 mins.     Subjective: Pt complains of abd pain--a little better.  Denies n/v/d.  Denies cp, sob.  One episode coffee grounds emesis 12/25 evening  Objective: Vitals:   10/12/24 0930 10/12/24 0945 10/12/24 1000 10/12/24 1050  BP: (!) 123/45 (!) 106/39 (!) 102/39 (!) 102/37  Pulse: 99 96 97 98  Resp: 18 20 17  (!) 21  Temp:      TempSrc:      SpO2: 100% 100% 99% 96%  Weight:      Height:        Intake/Output Summary (Last 24 hours) at 10/12/2024 1051 Last data filed at 10/12/2024 0945 Gross per 24 hour  Intake 5561.94 ml  Output --  Net 5561.94 ml   Weight change:  Exam:  General:  Pt is alert, follows commands appropriately, not in acute distress HEENT: No icterus, No thrush, No neck mass, [place]/AT Cardiovascular: RRR, S1/S2, no rubs, no gallops Respiratory: CTA bilaterally, no wheezing, no crackles, no rhonchi Abdomen: Soft/+BS, upper abd tender, non distended, no guarding Extremities: No edema, No lymphangitis, No petechiae, No rashes, no synovitis   Data Reviewed: I have personally reviewed following labs and imaging studies Basic Metabolic Panel: Recent Labs  Lab 10/11/24 0805 10/11/24 1837 10/12/24 0023  NA 134*  --  136  K 2.4* 3.9 3.9  CL 80*  --  96*  CO2 26  --  28  GLUCOSE 157*  --  132*  BUN 13  --  17  CREATININE 1.33*  --  0.89  CALCIUM 9.3  --   8.1*  MG  --  1.2* 3.5*  PHOS  --   --  2.1*   Liver Function Tests: Recent Labs  Lab 10/11/24 0805 10/12/24 0023  AST 94* 68*  ALT 35 25  ALKPHOS 123 82  BILITOT 0.9 1.0  PROT 7.7 5.4*  ALBUMIN 4.0 2.9*   Recent Labs  Lab 10/11/24 0805  LIPASE 44   No results for input(s): AMMONIA in the last 168 hours. Coagulation Profile: Recent Labs  Lab 10/11/24 0909  INR 1.2   CBC: Recent Labs  Lab 10/11/24 0807 10/11/24 1837 10/12/24 0023  WBC 6.8 10.4 13.7*  NEUTROABS 5.7  --   --   HGB 11.7* 9.3* 8.2*  HCT 33.8* 27.1* 23.5*  MCV 86.0 89.7 88.3  PLT 216 129* 120*   Cardiac Enzymes: No results for input(s): CKTOTAL, CKMB, CKMBINDEX, TROPONINI in the last 168 hours. BNP: Invalid input(s): POCBNP CBG: Recent Labs  Lab 10/11/24 0736  GLUCAP 173*   HbA1C: Recent Labs    10/11/24 1527  HGBA1C 4.9   Urine analysis:    Component Value Date/Time   COLORURINE YELLOW 10/11/2024 1405   APPEARANCEUR HAZY (A) 10/11/2024 1405   LABSPEC 1.018 10/11/2024 1405   PHURINE 5.0  10/11/2024 1405   GLUCOSEU NEGATIVE 10/11/2024 1405   HGBUR SMALL (A) 10/11/2024 1405   BILIRUBINUR NEGATIVE 10/11/2024 1405   KETONESUR NEGATIVE 10/11/2024 1405   PROTEINUR NEGATIVE 10/11/2024 1405   UROBILINOGEN 0.2 12/26/2014 1927   NITRITE NEGATIVE 10/11/2024 1405   LEUKOCYTESUR NEGATIVE 10/11/2024 1405   Sepsis Labs: @LABRCNTIP (procalcitonin:4,lacticidven:4) ) Recent Results (from the past 240 hours)  Blood culture (routine x 2)     Status: None (Preliminary result)   Collection Time: 10/11/24  8:07 AM   Specimen: BLOOD RIGHT HAND  Result Value Ref Range Status   Specimen Description   Final    BLOOD RIGHT HAND BOTTLES DRAWN AEROBIC AND ANAEROBIC   Special Requests   Final    Blood Culture results may not be optimal due to an inadequate volume of blood received in culture bottles   Culture   Final    NO GROWTH < 24 HOURS Performed at [facility], [address],  [place], [place] [address]    Report Status PENDING  Incomplete  Blood culture (routine x 2)     Status: None (Preliminary result)   Collection Time: 10/11/24  9:09 AM   Specimen: BLOOD LEFT HAND  Result Value Ref Range Status   Specimen Description   Final    BLOOD LEFT HAND BOTTLES DRAWN AEROBIC AND ANAEROBIC   Special Requests Blood Culture adequate volume  Final   Culture   Final    NO GROWTH < 24 HOURS Performed at [facility], [address] [place], [place] [address]    Report Status PENDING  Incomplete  Resp panel by RT-PCR (RSV, Flu A&B, Covid) Anterior Nasal Swab     Status: None   Collection Time: 10/11/24  2:30 PM   Specimen: Anterior Nasal Swab  Result Value Ref Range Status   SARS Coronavirus 2 by RT PCR NEGATIVE NEGATIVE Final    Comment: (NOTE) SARS-CoV-2 target nucleic acids are NOT DETECTED.  The SARS-CoV-2 RNA is generally detectable in upper respiratory specimens during the acute phase of infection. The lowest concentration of SARS-CoV-2 viral copies this assay can detect is 138 copies/mL. A negative result does not preclude SARS-Cov-2 infection and should not be used as the sole basis for treatment or other patient management decisions. A negative result may occur with  improper specimen collection/handling, submission of specimen other than nasopharyngeal swab, presence of viral mutation(s) within the areas targeted by this assay, and inadequate number of viral copies(<138 copies/mL). A negative result must be combined with clinical observations, patient history, and epidemiological information. The expected result is Negative.  Fact Sheet for Patients:  bloggercourse.com  Fact Sheet for Healthcare Providers:  seriousbroker.it  This test is no t yet approved or cleared by the [place]  FDA and  has been authorized for detection and/or diagnosis of SARS-CoV-2 by FDA under an Emergency Use Authorization  (EUA). This EUA will remain  in effect (meaning this test can be used) for the duration of the COVID-19 declaration under Section 564(b)(1) of the Act, 21 U.S.C.section 360bbb-3(b)(1), unless the authorization is terminated  or revoked sooner.       Influenza A by PCR NEGATIVE NEGATIVE Final   Influenza B by PCR NEGATIVE NEGATIVE Final    Comment: (NOTE) The Xpert Xpress SARS-CoV-2/FLU/RSV plus assay is intended as an aid in the diagnosis of influenza from Nasopharyngeal swab specimens and should not be used as a sole basis for treatment. Nasal washings and aspirates are unacceptable for Xpert Xpress SARS-CoV-2/FLU/RSV testing.  Fact Sheet for Patients: bloggercourse.com  Fact Sheet for Healthcare Providers: seriousbroker.it  This test is not yet approved or cleared by the [place]  FDA and has been authorized for detection and/or diagnosis of SARS-CoV-2 by FDA under an Emergency Use Authorization (EUA). This EUA will remain in effect (meaning this test can be used) for the duration of the COVID-19 declaration under Section 564(b)(1) of the Act, 21 U.S.C. section 360bbb-3(b)(1), unless the authorization is terminated or revoked.     Resp Syncytial Virus by PCR NEGATIVE NEGATIVE Final    Comment: (NOTE) Fact Sheet for Patients: bloggercourse.com  Fact Sheet for Healthcare Providers: seriousbroker.it  This test is not yet approved or cleared by the [place]  FDA and has been authorized for detection and/or diagnosis of SARS-CoV-2 by FDA under an Emergency Use Authorization (EUA). This EUA will remain in effect (meaning this test can be used) for the duration of the COVID-19 declaration under Section 564(b)(1) of the Act, 21 U.S.C. section 360bbb-3(b)(1), unless the authorization is terminated or revoked.  Performed at [facility], [address] [place], [place]  [address]   MRSA Next Gen by PCR, Nasal     Status: None   Collection Time: 10/11/24  2:30 PM  Result Value Ref Range Status   MRSA by PCR Next Gen NOT DETECTED NOT DETECTED Final    Comment: (NOTE) The GeneXpert MRSA Assay (FDA approved for NASAL specimens only), is one component of a comprehensive MRSA colonization surveillance program. It is not intended to diagnose MRSA infection nor to guide or monitor treatment for MRSA infections. Test performance is not FDA approved in patients less than 67 years old. Performed at [facility], [address] [place], [place] [address]      Scheduled Meds:  Chlorhexidine  Gluconate Cloth  6 each Topical Q0600   folic acid   1 mg Oral Daily   multivitamin with minerals  1 tablet Oral Daily   pantoprazole  (PROTONIX ) IV  40 mg Intravenous Q12H   PHENobarbital   32.4 mg Oral BID   phenytoin   100 mg Oral TID   sodium chloride  flush  10-40 mL Intracatheter Q12H   thiamine   100 mg Oral Daily   Or   thiamine   100 mg Intravenous Daily   Continuous Infusions:  ceFEPime  (MAXIPIME ) IV Stopped (10/12/24 0439)   lactated ringers      lactated ringers  125 mL/hr at 10/12/24 0945   norepinephrine  (LEVOPHED ) Adult infusion 7 mcg/min (10/12/24 0945)   octreotide  (SANDOSTATIN ) 500 mcg in sodium chloride  0.9 % 250 mL (2 mcg/mL) infusion 50 mcg/hr (10/12/24 0945)   vancomycin       Procedures/Studies: US  EKG SITE RITE Result Date: 10/11/2024 If Site Rite image not attached, placement could not be confirmed due to current cardiac rhythm.  CT ABDOMEN PELVIS W CONTRAST Result Date: 10/11/2024 CLINICAL DATA:  Abdominal pain.  Vomiting blood. EXAM: CT ABDOMEN AND PELVIS WITH CONTRAST TECHNIQUE: Multidetector CT imaging of the abdomen and pelvis was performed using the standard protocol following bolus administration of intravenous contrast. RADIATION DOSE REDUCTION: This exam was performed according to the departmental dose-optimization program which includes  automated exposure control, adjustment of the mA and/or kV according to patient size and/or use of iterative reconstruction technique. CONTRAST:  80mL OMNIPAQUE  IOHEXOL  300 MG/ML  SOLN COMPARISON:  08/08/2024 FINDINGS: Lower chest: Dependent atelectasis noted right lung base. Circumferential wall thickening noted distal esophagus. Hepatobiliary: Nodular liver contour is compatible cirrhosis. There is no evidence for gallstones, gallbladder wall thickening, or pericholecystic fluid.  No intrahepatic or extrahepatic biliary dilation. Pancreas: No focal mass lesion. No dilatation of the main duct. No intraparenchymal cyst. No peripancreatic edema. Spleen: No splenomegaly. No suspicious focal mass lesion. Adrenals/Urinary Tract: No adrenal nodule or mass. Kidneys unremarkable. No evidence for hydroureter. The urinary bladder appears normal for the degree of distention. Stomach/Bowel: Stomach is distended with fluid and gas. Esophagogastric junction is patent with fluid noted in the distal esophagus suggesting reflux. Duodenum is normally positioned as is the ligament of Treitz. No small bowel wall thickening. No small bowel dilatation. The terminal ileum is normal. The appendix is normal. No gross colonic mass. No colonic wall thickening. Diverticular changes are noted in the left colon without evidence of diverticulitis. Vascular/Lymphatic: There is mild atherosclerotic calcification of the abdominal aorta without aneurysm. There is no gastrohepatic or hepatoduodenal ligament lymphadenopathy. No retroperitoneal or mesenteric lymphadenopathy. No pelvic sidewall lymphadenopathy. Reproductive: Hysterectomy.  There is no adnexal mass. Other: No intraperitoneal free fluid. Musculoskeletal: No worrisome lytic or sclerotic osseous abnormality. Degenerative changes are noted in the hips bilaterally, left greater than right. IMPRESSION: 1. No acute findings in the abdomen or pelvis. Specifically, no findings to explain the  patient's history of abdominal pain and vomiting. 2. Nodular liver contour compatible with cirrhosis. 3. Circumferential wall thickening distal esophagus with fluid in the distal esophagus suggesting reflux. Imaging features compatible with esophagitis. Given the history of vomiting blood, upper endoscopy recommended to exclude neoplasm. 4. Left colonic diverticulosis without diverticulitis. 5.  Aortic Atherosclerosis (ICD10-I70.0). Electronically Signed   By: Camellia Candle M.D.   On: 10/11/2024 11:58   CT Head Wo Contrast Result Date: 10/11/2024 EXAM: CT HEAD WITHOUT CONTRAST 10/11/2024 11:35:24 AM TECHNIQUE: CT of the head was performed without the administration of intravenous contrast. Automated exposure control, iterative reconstruction, and/or weight based adjustment of the mA/kV was utilized to reduce the radiation dose to as low as reasonably achievable. COMPARISON: Head CT 08/08/2024. CLINICAL HISTORY: 68 year old female with vomiting blood and hypotension. FINDINGS: BRAIN AND VENTRICLES: No acute hemorrhage. No evidence of acute infarct. No extra-axial collection. No mass effect or midline shift. 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 Superimposed multifocal chronic cerebral encephalomalacia, pronounced throughout the anterior and superior left frontal lobe, at the right inferior frontal gyrus. Chronic ex vacuo ventricular enlargement. Stable brain volume. No suspicious intracranial vascular hyperdensity. ORBITS: No acute abnormality. SINUSES: Paranasal sinuses, tympanic cavities and mastoids are well aerated. SOFT TISSUES AND SKULL: No acute soft tissue abnormality. Heterogeneity of the calvarium appears stable since 2021 and most likely benign. No acute or suspicious bone lesion. Chronic severe upper cervical spine degeneration partially visible.  IMPRESSION: 1. No acute intracranial abnormality. 2. 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 Electronically signed by: Helayne Hurst MD 10/11/2024 11:40 AM EST RP Workstation: [id number]   DG Chest Portable 1 View Result Date: 10/11/2024 CLINICAL DATA:  Possible aspiration.  Hematemesis.  Hypotension. EXAM: PORTABLE CHEST 1 VIEW COMPARISON:  11/29/2021 and CT chest 08/08/2024. FINDINGS: Patient is rotated. Trachea is midline. Heart size stable. There may be minimal right basilar subsegmental atelectasis. Lungs are otherwise clear. No pleural fluid. No pneumothorax. Osteopenia. IMPRESSION: Minimal right basilar subsegmental atelectasis. Electronically Signed   By: Newell Eke M.D.   On: 10/11/2024 09:06    Alm Schneider, DO  [facility]  If 7PM-7AM, please contact  night-coverage [email or web address] Password [email or web address] 10/12/2024, 10:51 AM   LOS: 1 day  Morning sedated I think you Dr. Theophilus stated to have a plan I am calling about this patient that any pain ICU to that I like to get transferred on account so basically.  The deal is that she is a 68 year old lady alcoholic cirrhotic she continues to drink alcohol on a daily basis basically the family found her down on the floor yesterday on yesterday and L with what appeared to be like coffee grounds emesis next to her and so when she came in basically if she she was hypotensive she basically gotten almost 5 L of fluid resuscitation lactate initially was greater than 9 okay and sodium overnight we consulted GI and they will they were hesitant to do an EGD rec appear because of the hypotension she became a note she was placed on Levophed  overnight and currently she is on 7 mics okay her mentation is much better she is back to her even pretty close to baseline for mentation but lactate is still 7 this morning and on 7 mics of Levophed  and so that is ongoing for she is  on empiric Vanco and cefepime  I will or other cultures are cooking okay and so this oncologist to get her down there and GYN GI said that they you know if they were contacted if they are probably except you know get up to get her scoped if need be so so so he does not think that she is actively bleeding she had she had 1 more episode of marked small moderate amount of coffee grounds emesis last evening around 6 PM but nothing since then no no no send no signs of active bleeding right now she did not have any hematochezia there is no melena or anything like that brought since since admission so "

## 2024-10-13 DIAGNOSIS — F1010 Alcohol abuse, uncomplicated: Secondary | ICD-10-CM | POA: Diagnosis not present

## 2024-10-13 DIAGNOSIS — K920 Hematemesis: Secondary | ICD-10-CM | POA: Diagnosis not present

## 2024-10-13 DIAGNOSIS — E46 Unspecified protein-calorie malnutrition: Secondary | ICD-10-CM

## 2024-10-13 DIAGNOSIS — K922 Gastrointestinal hemorrhage, unspecified: Secondary | ICD-10-CM | POA: Diagnosis not present

## 2024-10-13 DIAGNOSIS — D62 Acute posthemorrhagic anemia: Secondary | ICD-10-CM | POA: Diagnosis not present

## 2024-10-13 LAB — CBC
HCT: 24.6 % — ABNORMAL LOW (ref 36.0–46.0)
Hemoglobin: 8.5 g/dL — ABNORMAL LOW (ref 12.0–15.0)
MCH: 30.6 pg (ref 26.0–34.0)
MCHC: 34.6 g/dL (ref 30.0–36.0)
MCV: 88.5 fL (ref 80.0–100.0)
Platelets: 62 K/uL — ABNORMAL LOW (ref 150–400)
RBC: 2.78 MIL/uL — ABNORMAL LOW (ref 3.87–5.11)
RDW: 15.5 % (ref 11.5–15.5)
WBC: 4.7 K/uL (ref 4.0–10.5)
nRBC: 0 % (ref 0.0–0.2)

## 2024-10-13 LAB — TYPE AND SCREEN
ABO/RH(D): O POS
Antibody Screen: NEGATIVE
Unit division: 0

## 2024-10-13 LAB — HEMOGLOBIN AND HEMATOCRIT, BLOOD
HCT: 25.1 % — ABNORMAL LOW (ref 36.0–46.0)
HCT: 23.9 % — ABNORMAL LOW (ref 36.0–46.0)
HCT: 26.5 % — ABNORMAL LOW (ref 36.0–46.0)
HCT: 28.5 % — ABNORMAL LOW (ref 36.0–46.0)
Hemoglobin: 8.5 g/dL — ABNORMAL LOW (ref 12.0–15.0)
Hemoglobin: 7.8 g/dL — ABNORMAL LOW (ref 12.0–15.0)
Hemoglobin: 8.9 g/dL — ABNORMAL LOW (ref 12.0–15.0)
Hemoglobin: 9.2 g/dL — ABNORMAL LOW (ref 12.0–15.0)

## 2024-10-13 LAB — BPAM RBC
Blood Product Expiration Date: 202601242359
ISSUE DATE / TIME: 202512262130
Unit Type and Rh: 5100

## 2024-10-13 LAB — GLUCOSE, CAPILLARY
Glucose-Capillary: 110 mg/dL — ABNORMAL HIGH (ref 70–99)
Glucose-Capillary: 119 mg/dL — ABNORMAL HIGH (ref 70–99)
Glucose-Capillary: 121 mg/dL — ABNORMAL HIGH (ref 70–99)
Glucose-Capillary: 126 mg/dL — ABNORMAL HIGH (ref 70–99)
Glucose-Capillary: 127 mg/dL — ABNORMAL HIGH (ref 70–99)

## 2024-10-13 MED ORDER — ADULT MULTIVITAMIN W/MINERALS CH
1.0000 | ORAL_TABLET | Freq: Every day | ORAL | Status: DC
  Administered 2024-10-13 – 2024-10-19 (×7): 1 via ORAL
  Filled 2024-10-13 (×2): qty 1

## 2024-10-13 MED ORDER — THIAMINE MONONITRATE 100 MG PO TABS
100.0000 mg | ORAL_TABLET | Freq: Every day | ORAL | Status: DC
  Administered 2024-10-14 – 2024-10-19 (×6): 100 mg via ORAL
  Filled 2024-10-13: qty 1

## 2024-10-13 MED ORDER — BOOST / RESOURCE BREEZE PO LIQD CUSTOM
1.0000 | Freq: Three times a day (TID) | ORAL | Status: DC
  Administered 2024-10-13 – 2024-10-17 (×12): 1 via ORAL
  Administered 2024-10-17: 237 mL via ORAL
  Administered 2024-10-17 – 2024-10-18 (×2): 1 via ORAL
  Administered 2024-10-18: 237 mL via ORAL
  Administered 2024-10-19: 1 via ORAL

## 2024-10-13 MED ORDER — PHENYTOIN SODIUM 50 MG/ML IJ SOLN
100.0000 mg | Freq: Three times a day (TID) | INTRAMUSCULAR | Status: DC
  Filled 2024-10-13: qty 2

## 2024-10-13 MED ORDER — FOLIC ACID 5 MG/ML IJ SOLN
1.0000 mg | Freq: Every day | INTRAMUSCULAR | Status: DC
  Filled 2024-10-13: qty 0.2

## 2024-10-13 MED ORDER — THIAMINE MONONITRATE 100 MG PO TABS: 100.0000 mg | ORAL_TABLET | Freq: Every day | ORAL | Status: DC

## 2024-10-13 MED ORDER — PHENOBARBITAL SODIUM 65 MG/ML IJ SOLN: 32.4000 mg | Freq: Two times a day (BID) | INTRAMUSCULAR | Status: DC

## 2024-10-13 MED ORDER — PHENOBARBITAL 32.4 MG PO TABS
32.4000 mg | ORAL_TABLET | Freq: Two times a day (BID) | ORAL | Status: AC
  Administered 2024-10-13 – 2024-10-15 (×6): 32.4 mg via ORAL
  Filled 2024-10-13 (×4): qty 1

## 2024-10-13 MED ORDER — FOLIC ACID 1 MG PO TABS
1.0000 mg | ORAL_TABLET | Freq: Every day | ORAL | Status: DC
  Administered 2024-10-13 – 2024-10-19 (×7): 1 mg via ORAL
  Filled 2024-10-13 (×2): qty 1

## 2024-10-13 MED ORDER — PHENYTOIN SODIUM EXTENDED 100 MG PO CAPS
100.0000 mg | ORAL_CAPSULE | Freq: Three times a day (TID) | ORAL | Status: DC
  Administered 2024-10-13 – 2024-10-19 (×19): 100 mg via ORAL
  Filled 2024-10-13 (×7): qty 1

## 2024-10-13 NOTE — Progress Notes (Signed)
 Informed patient had a large bowel motion, no bleeding noted.  Has remained hemodynamically stable Mental status improving,  Will transfer to MedSurg

## 2024-10-13 NOTE — Plan of Care (Signed)
   Problem: Education: Goal: Knowledge of General Education information will improve Description Including pain rating scale, medication(s)/side effects and non-pharmacologic comfort measures Outcome: Progressing   Problem: Health Behavior/Discharge Planning: Goal: Ability to manage health-related needs will improve Outcome: Progressing

## 2024-10-13 NOTE — Evaluation (Signed)
 Physical Therapy Evaluation Patient Details Name: [name] MRN: [id number] DOB: 08/04/1956 Today's Date: 10/13/2024  History of Present Illness  68 yo F admitted 10/11/24 after being found by family at home, lying on the floor w dark emesis. Found to have undifferentiated shock, liver cirrhosis, EtOH abuse  Alcohol withdrawal  .PMH IBS PUD asthma sz disorder etoh abuse, stroke.  Clinical Impression  Pt admitted with above diagnosis. PTA pt ambulatory with her cane, but needed assist with ADLs. Reports PRN assist available but not 24/7. She required up to mod assist for bed mobility and transfers today. Progressed to min assist with transfer and short distance gait stepping forward, backwards and laterally along bed with hand held support. Pt currently with functional limitations due to the deficits listed below (see PT Problem List). Patient will benefit from continued inpatient follow up therapy, <3 hours/day. Will update recs as she progresses or more information about care available at home. Pt was minimally verbal but more alert after assisting up in bed. VSS on RA throughout. Some dark tarry stool noted on bed pad - RN notified. Pt will benefit from acute skilled PT to increase their independence and safety with mobility to allow discharge.           If plan is discharge home, recommend the following: A little help with walking and/or transfers;A little help with bathing/dressing/bathroom;Assistance with cooking/housework;Assist for transportation;Supervision due to cognitive status;Help with stairs or ramp for entrance   Can travel by private vehicle   Yes    Equipment Recommendations None recommended by PT  Recommendations for Other Services       Functional Status Assessment Patient has had a recent decline in their functional status and demonstrates the ability to make significant improvements in function in a reasonable and predictable amount of time.     Precautions /  Restrictions Precautions Precautions: Fall Recall of Precautions/Restrictions: Impaired Restrictions Weight Bearing Restrictions Per Provider Order: No      Mobility  Bed Mobility Overal bed mobility: Needs Assistance Bed Mobility: Supine to Sit, Sit to Supine     Supine to sit: Mod assist, HOB elevated Sit to supine: Mod assist   General bed mobility comments: Mod assist to facilitate, support trunk, and LEs out of and back into bed. Cues for technique throughout.    Transfers Overall transfer level: Needs assistance Equipment used: 1 person hand held assist Transfers: Sit to/from Stand Sit to Stand: Mod assist           General transfer comment: Mod assist for boost to stand from edge of bed supporting Rt side, using back of chair on Lt for support. Progressed with repetition to min assist level. Cues to widen feet prior to rising.    Ambulation/Gait Ambulation/Gait assistance: Min assist Gait Distance (Feet): 6 Feet Assistive device: 1 person hand held assist Gait Pattern/deviations: Step-to pattern, Decreased stride length, Decreased weight shift to right, Narrow base of support Gait velocity: dec     General Gait Details: Tolerated several steps forward and backwards with min assist for balance and UE support. Cues for sequencing. Difficulty sidestepping towards rt along bed.  Stairs            Wheelchair Mobility     Tilt Bed    Modified Rankin (Stroke Patients Only)       Balance Overall balance assessment: Needs assistance Sitting-balance support: No upper extremity supported, Feet supported Sitting balance-Leahy Scale: Fair     Standing balance support: Single  extremity supported, During functional activity Standing balance-Leahy Scale: Poor                               Pertinent Vitals/Pain Pain Assessment Pain Assessment: Faces Faces Pain Scale: Hurts little more Pain Location: abdomen Pain Descriptors / Indicators:  Aching Pain Intervention(s): Limited activity within patient's tolerance, Monitored during session, Repositioned    Home Living Family/patient expects to be discharged to:: Private residence Living Arrangements: Spouse/significant other Available Help at Discharge: Family;Available PRN/intermittently Type of Home: Mobile home Home Access: Ramped entrance       Home Layout: One level Home Equipment: Agricultural [employer]ant (2 wheels);Cane - single point;BSC/3in1;Wheelchair - manual;Cane - quad;Grab bars - tub/shower Additional Comments: Need to confirm - information obtained from prior admission - pt minimally verbal during session.    Prior Function Prior Level of Function : Needs assist             Mobility Comments: Reports using cane to ambulate at baseline ADLs Comments: Reports needing assist with bathing/dressing at baseline.     Extremity/Trunk Assessment   Upper Extremity Assessment Upper Extremity Assessment: Defer to OT evaluation    Lower Extremity Assessment Lower Extremity Assessment: Difficult to assess due to impaired cognition;RLE deficits/detail RLE Deficits / Details: Difficult to fully assess. able to fully bear weight. Appears to have some tone.       Communication   Communication Communication: Impaired Factors Affecting Communication: Difficulty expressing self;Reduced clarity of speech    Cognition Arousal: Lethargic Behavior During Therapy: Flat affect   PT - Cognitive impairments: No family/caregiver present to determine baseline, Initiation, Problem solving, Sequencing                         Following commands: Impaired Following commands impaired: Follows one step commands with increased time, Follows one step commands inconsistently     Cueing Cueing Techniques: Verbal cues, Gestural cues, Tactile cues     General Comments General comments (skin integrity, edema, etc.): Dark tarry stool (small amount) on bed pad. Assisted with  peri-care.    Exercises General Exercises - Lower Extremity Ankle Circles/Pumps: AROM, Both, 10 reps, Supine   Assessment/Plan    PT Assessment Patient needs continued PT services  PT Problem List Decreased strength;Decreased range of motion;Decreased activity tolerance;Decreased balance;Decreased mobility;Decreased coordination;Decreased cognition;Decreased knowledge of use of DME;Decreased safety awareness;Decreased knowledge of precautions;Impaired tone       PT Treatment Interventions DME instruction;Gait training;Functional mobility training;Therapeutic activities;Therapeutic exercise;Balance training;Neuromuscular re-education;Cognitive remediation;Patient/family education;Wheelchair mobility training;Stair training    PT Goals (Current goals can be found in the Care Plan section)  Acute Rehab PT Goals Patient Stated Goal: get well PT Goal Formulation: With patient Time For Goal Achievement: 10/27/24 Potential to Achieve Goals: Good    Frequency Min 2X/week     Co-evaluation               AM-PAC PT 6 Clicks Mobility  Outcome Measure Help needed turning from your back to your side while in a flat bed without using bedrails?: A Lot Help needed moving from lying on your back to sitting on the side of a flat bed without using bedrails?: A Lot Help needed moving to and from a bed to a chair (including a wheelchair)?: A Lot Help needed standing up from a chair using your arms (e.g., wheelchair or bedside chair)?: A Lot Help needed to walk in hospital  room?: A Lot Help needed climbing 3-5 steps with a railing? : A Lot 6 Click Score: 12    End of Session Equipment Utilized During Treatment: Gait belt Activity Tolerance: Patient limited by fatigue;Patient limited by lethargy Patient left: in bed;with call bell/phone within reach;with bed alarm set;with nursing/sitter in room;with restraints reapplied Nurse Communication: Mobility status;Other (comment) (stool) PT Visit  Diagnosis: Unsteadiness on feet (R26.81);Other abnormalities of gait and mobility (R26.89);Muscle weakness (generalized) (M62.81);Difficulty in walking, not elsewhere classified (R26.2);Other symptoms and signs involving the nervous system (R29.898)    Time: 8382-8361 PT Time Calculation (min) (ACUTE ONLY): 21 min   Charges:   PT Evaluation $PT Eval Moderate Complexity: 1 Mod   PT General Charges $$ ACUTE PT VISIT: 1 Visit         Leontine Roads, PT, DPT [facility]  Rehabilitation Services Physical Therapist Office: [phone] Website: [email or web address]   Leontine GORMAN Roads 10/13/2024, 5:00 PM

## 2024-10-13 NOTE — Progress Notes (Signed)
 "    Daily Progress Note  DOA: 10/11/2024 Hospital Day: 3  Cc: Cirrhosis, altered mental status, coffee-ground emesis with anemia  ASSESSMENT    68 year old female with alcohol associated cirrhosis without prior history of esophageal varices transferred from [facility] yesterday with coffee-ground emesis and hypotension on pressors  MELD 3.0: 11 at 10/12/2024  6:04 PM MELD-Na: 8 at 10/12/2024  6:04 PM Patient now off Levophed .  No further overt GI bleeding   Acute on chronic normocytic anemia in setting of GI bleed Received a unit of blood at midnight for hemoglobin of 6.7.  Hemoglobin improved to 8.5 this a.m.  History of GERD, severe erosive esophagitis /upper GI bleed in 2016 and a 3 cm hiatal hernia  History of CVA at age 38  Seizure disorder  Principal Problem:   SIRS (systemic inflammatory response syndrome) (HCC) Active Problems:   Alcohol abuse   Hypokalemia   Upper GI bleed   Hematemesis   ABLA (acute blood loss anemia)   Lactic acidosis   Transaminitis   PLAN   --Okay for clear liquids --Patient not having active GI bleeding at this point (no further coffee-ground emesis, no melena) however on DRE this morning there is some residual black stool in vault.  She is on CIWA protocol.  She has just received Ativan  and is confused. Will plan for EGD in the next 24 to 48 hours.   -- In the interim continue twice daily pantoprazole  , octreotide  infusion .  --Once tolerating clear liquids will add twice daily lactulose  --Continue antibiotics (cirrhosis with GI bleeding ).  --She is still on CIWA protocol, confused. I will talk with sister later today or in am about EGD / consent  Subjective   Patient confused   Objective      Recent Labs    10/12/24 0023 10/12/24 1804 10/12/24 2020 10/13/24 0312 10/13/24 0315 10/13/24 0851  WBC 13.7* 4.9  --   --  4.7  --   HGB 8.2* 6.7*   < > 8.5* 8.5* 7.8*  HCT 23.5* 19.9*   < > 25.1* 24.6* 23.9*  MCV 88.3 89.2  --    --  88.5  --   PLT 120* 68*  --   --  62*  --    < > = values in this interval not displayed.   Recent Labs    10/11/24 1527  FOLATE 6.7  VITAMINB12 1,612*   Recent Labs    10/11/24 0805 10/11/24 1837 10/12/24 0023 10/12/24 1804  NA 134*  --  136 142  K 2.4* 3.9 3.9 3.5  CL 80*  --  96* 107  CO2 26  --  28 30  GLUCOSE 157*  --  132* 124*  BUN 13  --  17 13  CREATININE 1.33*  --  0.89 0.75  CALCIUM 9.3  --  8.1* 8.1*   Recent Labs    10/11/24 0805 10/12/24 0023 10/12/24 1804  PROT 7.7 5.4* 4.8*  ALBUMIN 4.0 2.9* 2.6*  AST 94* 68* 47*  ALT 35 25 18  ALKPHOS 123 82 74  BILITOT 0.9 1.0 0.8        Scheduled inpatient medications:   Chlorhexidine  Gluconate Cloth  6 each Topical Q0600   folic acid   1 mg Oral Daily   pantoprazole  (PROTONIX ) IV  40 mg Intravenous Q12H   phenobarbital   32.4 mg Oral BID   phenytoin   100 mg Oral TID   sodium chloride  flush  10-40 mL Intracatheter  Q12H   [START ON 10/14/2024] thiamine   100 mg Oral Daily   Continuous inpatient infusions:   ceFEPime  (MAXIPIME ) IV Stopped (10/13/24 0545)   lactated ringers  75 mL/hr at 10/13/24 0900   norepinephrine  (LEVOPHED ) Adult infusion Stopped (10/12/24 1511)   octreotide  (SANDOSTATIN ) 500 mcg in sodium chloride  0.9 % 250 mL (2 mcg/mL) infusion 50 mcg/hr (10/13/24 0900)   PRN inpatient medications: LORazepam , LORazepam  **OR** LORazepam , ondansetron  **OR** ondansetron  (ZOFRAN ) IV, sodium chloride  flush  Vital signs in last 24 hours: Temp:  [97.7 F (36.5 C)-98.9 F (37.2 C)] 97.7 F (36.5 C) (12/27 0808) Pulse Rate:  [89-109] 93 (12/27 0900) Resp:  [12-24] 17 (12/27 0900) BP: (100-133)/(37-76) 122/60 (12/27 0900) SpO2:  [90 %-100 %] 96 % (12/27 0900) Weight:  [62.2 kg] 62.2 kg (12/26 1430) Last BM Date : 10/11/24  Intake/Output Summary (Last 24 hours) at 10/13/2024 0932 Last data filed at 10/13/2024 0900 Gross per 24 hour  Intake 4532.14 ml  Output 2500 ml  Net 2032.14 ml     Intake/Output from previous day: 12/26 0701 - 12/27 0700 In: 6572.8 [I.V.:4962.6; Blood:315; IV Piggyback:1295.2] Out: 2100 [Urine:2100] Intake/Output this shift: Total I/O In: 199.6 [I.V.:199.6] Out: 400 [Urine:400]   Physical Exam:  General: Alert female in NAD Heart:  Regular rate and rhythm.  Pulmonary: Normal respiratory effort Abdomen: Soft, nondistended, nontender. Normal bowel sounds. Extremities: No lower extremity edema  Neurologic: Alert, confused Rectal: Scant amount of soft black stool in vault Psych: Pleasant. Cooperative     LOS: 2 days   Vina Dasen ,NP 10/13/2024, 9:32 AM       "

## 2024-10-13 NOTE — Progress Notes (Addendum)
 "  NAME:  [name], MRN:  [id number], DOB:  Apr 19, 1956, LOS: 2 ADMISSION DATE:  10/11/2024, CONSULTATION DATE: 10/12/24 REFERRING MD:  Dr Tat, CHIEF COMPLAINT: Shock  History of Present Illness:  68 yo F PMH IBS PUD asthma sz disorder etoh abuse admitted to [facility] 10/11/24 after being found by family at home, lying on the floor w dark emesis.  Hypotensive, improved initially w IVF but ultimately req NE.  Was started on octreotide  and BID PPI, as well as vanc cefepime .    GI was consulted 12/26 and due to ongoing NE req, advised txf to [place] campus for higher level of care    PCCM consulted in this setting      On arrival [facility] she is endorsing mild abd tenderness. States she vomited at [facility] this morning, and when asked she endorsed coffee ground emesis. No further emesis.  States she's has had watery emesis at home x 8 days   Pertinent  Medical History  Etoh abuse Esophagitis  Cirrhosis  Sz disorder   Significant Hospital Events: Including procedures, antibiotic start and stop dates in addition to other pertinent events   12/25-admitted to the hospital, started on norepinephrine  12/26 transferred to ICU  Interim History / Subjective:  No overnight events Awake alert interactive Some abdominal discomfort  Objective    Blood pressure 124/63, pulse 95, temperature (!) 97.5 F (36.4 C), temperature source Oral, resp. rate 17, height 5' 4 (1.626 m), weight 62.2 kg, SpO2 100%.        Intake/Output Summary (Last 24 hours) at 10/13/2024 1205 Last data filed at 10/13/2024 1000 Gross per 24 hour  Intake 3547.2 ml  Output 2500 ml  Net 1047.2 ml   Filed Weights   10/11/24 0731 10/11/24 1412 10/12/24 1430  Weight: 64 kg 59.3 kg 62.2 kg    Examination: General: Acute on chronically ill-appearing HENT: Poor dentition Lungs: Clear breath sounds Cardiovascular: S1-S2 appreciated Abdomen: Soft, bowel sounds appreciated Extremities: Right hand contracture Neuro: Awake alert  interactive, slow responses GU: Fair output  I reviewed last 24 h vitals and pain scores, last 48 h intake and output, last 24 h labs and trends, and last 24 h imaging results.  Resolved problem list   Assessment and Plan   EtOH abuse Alcohol withdrawal - Continue phenobarb taper - Continue CIWA  Liver cirrhosis MEL D sodium of 8 -Remains on octreotide   On oral intake  Undifferentiated shock Hypovolemic shock Concern for bleeding with H&H drop Blood loss anemia with GI bleed -Transfuse per protocol - Continue monitoring - Appreciate GI follow-up - Does not appear to have active bleeding at present - S/p 1 unit transfusion  Lactic acidosis - Improving - Continue to monitor  Protein calorie malnutrition Present on admission - Continue to monitor  Seizure disorder - Dilantin    Labs   CBC: Recent Labs  Lab 10/11/24 0807 10/11/24 1837 10/12/24 0023 10/12/24 1804 10/12/24 2020 10/13/24 0312 10/13/24 0315 10/13/24 0851  WBC 6.8 10.4 13.7* 4.9  --   --  4.7  --   NEUTROABS 5.7  --   --   --   --   --   --   --   HGB 11.7* 9.3* 8.2* 6.7* 6.7* 8.5* 8.5* 7.8*  HCT 33.8* 27.1* 23.5* 19.9* 20.1* 25.1* 24.6* 23.9*  MCV 86.0 89.7 88.3 89.2  --   --  88.5  --   PLT 216 129* 120* 68*  --   --  62*  --  Basic Metabolic Panel: Recent Labs  Lab 10/11/24 0805 10/11/24 1837 10/12/24 0023 10/12/24 1804  NA 134*  --  136 142  K 2.4* 3.9 3.9 3.5  CL 80*  --  96* 107  CO2 26  --  28 30  GLUCOSE 157*  --  132* 124*  BUN 13  --  17 13  CREATININE 1.33*  --  0.89 0.75  CALCIUM 9.3  --  8.1* 8.1*  MG  --  1.2* 3.5* 2.0  PHOS  --   --  2.1* 1.4*   GFR: Estimated Creatinine Clearance: 58.9 mL/min (by C-G formula based on SCr of 0.75 mg/dL). Recent Labs  Lab 10/11/24 0807 10/11/24 0909 10/11/24 1837 10/12/24 0023 10/12/24 1440 10/12/24 1804 10/13/24 0315  PROCALCITON  --   --   --  0.96  --   --   --   WBC 6.8  --  10.4 13.7*  --  4.9 4.7  LATICACIDVEN  >9.0* >9.0* 7.0*  --  5.6*  --   --     Liver Function Tests: Recent Labs  Lab 10/11/24 0805 10/12/24 0023 10/12/24 1804  AST 94* 68* 47*  ALT 35 25 18  ALKPHOS 123 82 74  BILITOT 0.9 1.0 0.8  PROT 7.7 5.4* 4.8*  ALBUMIN 4.0 2.9* 2.6*   Recent Labs  Lab 10/11/24 0805  LIPASE 44   Recent Labs  Lab 10/12/24 1700  AMMONIA 21    ABG    Component Value Date/Time   HCO3 21.1 12/17/2020 2252   TCO2 25 01/05/2023 1847   ACIDBASEDEF 2.6 (H) 12/17/2020 2252   O2SAT 35.6 12/17/2020 2252     Coagulation Profile: Recent Labs  Lab 10/11/24 0909  INR 1.2    Cardiac Enzymes: No results for input(s): CKTOTAL, CKMB, CKMBINDEX, TROPONINI in the last 168 hours.  HbA1C: Hgb A1c MFr Bld  Date/Time Value Ref Range Status  10/11/2024 03:27 PM 4.9 4.8 - 5.6 % Final    Comment:    (NOTE) Diagnosis of Diabetes The following HbA1c ranges recommended by the American Diabetes Association (ADA) may be used as an aid in the diagnosis of diabetes mellitus.  Hemoglobin             Suggested A1C NGSP%              Diagnosis  <5.7                   Non Diabetic  5.7-6.4                Pre-Diabetic  >6.4                   Diabetic  <7.0                   Glycemic control for                       adults with diabetes.      CBG: Recent Labs  Lab 10/12/24 1945 10/13/24 0007 10/13/24 0340 10/13/24 0850 10/13/24 1148  GLUCAP 112* 126* 121* 119* 110*    Review of Systems:   Awake alert interactive Denies pain, some abdominal discomfort  Past Medical History:  She,  has a past medical history of Alcohol abuse, Asthma, Contracture of muscle of hand, GERD (gastroesophageal reflux disease), HTN (hypertension), PUD (peptic ulcer disease), Seizures (HCC), Sleep apnea, and Stroke (HCC).   Surgical History:   Past  Surgical History:  Procedure Laterality Date   ABDOMINAL HYSTERECTOMY  2004   complete   BIOPSY  10/06/2022   Procedure: BIOPSY;  Surgeon: Cindie Carlin POUR, DO;  Location: [facility];  Service: Endoscopy;;   BRAIN SURGERY     age 70 stroke   COLONOSCOPY     COLONOSCOPY WITH PROPOFOL   10/03/2012   Dr. Harvey: moderate diverticulosis, small internal hemorrhoids, TUBULAR ADENOMA. Next colonoscopy 2023 per Dr. harvey.   COLONOSCOPY WITH PROPOFOL  N/A 10/06/2022   Procedure: COLONOSCOPY WITH PROPOFOL ;  Surgeon: Cindie Carlin POUR, DO;  Location: [facility];  Service: Endoscopy;  Laterality: N/A;  9:15 am   ESOPHAGOGASTRODUODENOSCOPY N/A 10/18/2014   Dr. Shaaron during inpatient hospitalization: severe exudative/erosive reflux esophagitis as source of trivial upper GI bleed. No varices    ESOPHAGOGASTRODUODENOSCOPY (EGD) WITH PROPOFOL   10/03/2012   Dr. Harvey: stricture at GE junction s/p dilation, mild gastritis negative H.pylori    ESOPHAGOGASTRODUODENOSCOPY (EGD) WITH PROPOFOL  N/A 12/27/2017   Grade D esophagitis. Medium-sized hiatal hernia, normal duodenum   ESOPHAGOGASTRODUODENOSCOPY (EGD) WITH PROPOFOL  N/A 10/06/2022   Procedure: ESOPHAGOGASTRODUODENOSCOPY (EGD) WITH PROPOFOL ;  Surgeon: Cindie Carlin POUR, DO;  Location: [facility];  Service: Endoscopy;  Laterality: N/A;   POLYPECTOMY  10/06/2022   Procedure: POLYPECTOMY;  Surgeon: Cindie Carlin POUR, DO;  Location: [facility];  Service: Endoscopy;;   right tib-fib fracture  2008   SAVORY DILATION  10/03/2012   Procedure: SAVORY DILATION;  Surgeon: Margo LITTIE Harvey, MD;  Location: [facility];  Service: Endoscopy;  Laterality: N/A;  started at 1303,  dilated 12.8-16mm     Social History:   reports that she quit smoking about 10 years ago. Her smoking use included cigarettes. She started smoking about 19 years ago. She has a 0.4 pack-year smoking history. She has never used smokeless tobacco. She reports current alcohol use of about 35.0 standard drinks of alcohol per week. She reports that she does not use drugs.   Family History:  Her family history includes Liver disease in her  sister. There is no history of Colon cancer or Colon polyps.   Allergies Allergies[1]   The patient is critically ill with multiple organ systems failure and requires high complexity decision making for assessment and support, frequent evaluation and titration of therapies, application of advanced monitoring technologies and extensive interpretation of multiple databases. Critical Care Time devoted to patient care services described in this note independent of APP/resident time (if applicable)  is 32 minutes.   Jennet Epley MD [facility] Personal pager: See Amion If unanswered, please page CCM On-call: #[phone]      [1] No Known Allergies  "

## 2024-10-14 DIAGNOSIS — K7030 Alcoholic cirrhosis of liver without ascites: Secondary | ICD-10-CM | POA: Diagnosis not present

## 2024-10-14 DIAGNOSIS — K922 Gastrointestinal hemorrhage, unspecified: Secondary | ICD-10-CM | POA: Diagnosis not present

## 2024-10-14 DIAGNOSIS — R6510 Systemic inflammatory response syndrome (SIRS) of non-infectious origin without acute organ dysfunction: Secondary | ICD-10-CM | POA: Diagnosis not present

## 2024-10-14 LAB — CBC WITH DIFFERENTIAL/PLATELET
Abs Immature Granulocytes: 0.01 K/uL (ref 0.00–0.07)
Basophils Absolute: 0 K/uL (ref 0.0–0.1)
Basophils Relative: 1 %
Eosinophils Absolute: 0.1 K/uL (ref 0.0–0.5)
Eosinophils Relative: 3 %
HCT: 28.1 % — ABNORMAL LOW (ref 36.0–46.0)
Hemoglobin: 9.3 g/dL — ABNORMAL LOW (ref 12.0–15.0)
Immature Granulocytes: 0 %
Lymphocytes Relative: 24 %
Lymphs Abs: 0.9 K/uL (ref 0.7–4.0)
MCH: 30.2 pg (ref 26.0–34.0)
MCHC: 33.1 g/dL (ref 30.0–36.0)
MCV: 91.2 fL (ref 80.0–100.0)
Monocytes Absolute: 0.5 K/uL (ref 0.1–1.0)
Monocytes Relative: 13 %
Neutro Abs: 2.2 K/uL (ref 1.7–7.7)
Neutrophils Relative %: 59 %
Platelets: 61 K/uL — ABNORMAL LOW (ref 150–400)
RBC: 3.08 MIL/uL — ABNORMAL LOW (ref 3.87–5.11)
RDW: 16 % — ABNORMAL HIGH (ref 11.5–15.5)
WBC: 3.7 K/uL — ABNORMAL LOW (ref 4.0–10.5)
nRBC: 0 % (ref 0.0–0.2)

## 2024-10-14 LAB — COMPREHENSIVE METABOLIC PANEL WITH GFR
ALT: 18 U/L (ref 0–44)
AST: 46 U/L — ABNORMAL HIGH (ref 15–41)
Albumin: 2.7 g/dL — ABNORMAL LOW (ref 3.5–5.0)
Alkaline Phosphatase: 81 U/L (ref 38–126)
Anion gap: 7 (ref 5–15)
BUN: 7 mg/dL — ABNORMAL LOW (ref 8–23)
CO2: 32 mmol/L (ref 22–32)
Calcium: 8.3 mg/dL — ABNORMAL LOW (ref 8.9–10.3)
Chloride: 109 mmol/L (ref 98–111)
Creatinine, Ser: 0.62 mg/dL (ref 0.44–1.00)
GFR, Estimated: >60 mL/min
Glucose, Bld: 137 mg/dL — ABNORMAL HIGH (ref 70–99)
Potassium: 3.2 mmol/L — ABNORMAL LOW (ref 3.5–5.1)
Sodium: 148 mmol/L — ABNORMAL HIGH (ref 135–145)
Total Bilirubin: 1 mg/dL (ref 0.0–1.2)
Total Protein: 5.2 g/dL — ABNORMAL LOW (ref 6.5–8.1)

## 2024-10-14 LAB — HEMOGLOBIN AND HEMATOCRIT, BLOOD
HCT: 30.7 % — ABNORMAL LOW (ref 36.0–46.0)
Hemoglobin: 10.2 g/dL — ABNORMAL LOW (ref 12.0–15.0)

## 2024-10-14 LAB — PHOSPHORUS: Phosphorus: 1.7 mg/dL — ABNORMAL LOW (ref 2.5–4.6)

## 2024-10-14 MED ORDER — LACTULOSE 10 GM/15ML PO SOLN: 20.0000 g | Freq: Two times a day (BID) | ORAL | Status: DC

## 2024-10-14 MED ORDER — POTASSIUM CHLORIDE CRYS ER 20 MEQ PO TBCR
40.0000 meq | EXTENDED_RELEASE_TABLET | ORAL | Status: DC
  Administered 2024-10-14: 40 meq via ORAL
  Filled 2024-10-14: qty 2

## 2024-10-14 MED ORDER — SODIUM CHLORIDE 0.9 % IV SOLN: INTRAVENOUS | Status: DC

## 2024-10-14 MED ORDER — LACTULOSE 10 GM/15ML PO SOLN
20.0000 g | Freq: Three times a day (TID) | ORAL | Status: DC
  Administered 2024-10-14 – 2024-10-18 (×11): 20 g via ORAL
  Filled 2024-10-14 (×2): qty 30

## 2024-10-14 MED ORDER — LACTULOSE 10 GM/15ML PO SOLN: 30.0000 g | Freq: Three times a day (TID) | ORAL | Status: DC

## 2024-10-14 MED ORDER — POTASSIUM CHLORIDE 2 MEQ/ML IV SOLN
INTRAVENOUS | Status: AC
  Filled 2024-10-14: qty 1000

## 2024-10-14 MED ORDER — POTASSIUM CHLORIDE 10 MEQ/100ML IV SOLN
10.0000 meq | INTRAVENOUS | Status: AC
  Administered 2024-10-14 (×2): 10 meq via INTRAVENOUS
  Filled 2024-10-14 (×2): qty 100

## 2024-10-14 NOTE — Evaluation (Signed)
 Occupational Therapy Evaluation Patient Details Name: [name] MRN: [id number] DOB: 1956-06-14 Today's Date: 10/14/2024   History of Present Illness   68 yo F admitted 10/11/24 after being found by family at home, lying on the floor w dark emesis. Found to have undifferentiated shock, liver cirrhosis, EtOH abuse  Alcohol withdrawal  .PMH IBS PUD asthma sz disorder etoh abuse, stroke.     Clinical Impressions Pt admitted based on above, and was seen based on problem list below. Pt with decreased cognition, only oriented to self, delayed initiation, poor sequencing, attention, stm, and situational awareness. Pt unreliable historian, no family present to verify PLOF. Pt reports at baseline she lives with her sister, who assists with ADLs, and uses cane for mobility. Today pt is mod to total assist for ADLs. Bed mobility and functional transfers are mod assist with use of single UE support. RUE in flexor synergy contracture, MAS 4/4, pt reports this is baseline since CVA. Pain in LUE with ROM also limiting pt. Based on performance today, recommending <3 hours of skilled rehab daily to maximize functional independence. OT will continue to follow acutely.      If plan is discharge home, recommend the following:   A lot of help with walking and/or transfers;A lot of help with bathing/dressing/bathroom;Assistance with cooking/housework;Assistance with feeding;Assist for transportation     Functional Status Assessment   Patient has had a recent decline in their functional status and demonstrates the ability to make significant improvements in function in a reasonable and predictable amount of time.     Equipment Recommendations   Other (comment) (Defer to next venue)      Precautions/Restrictions   Precautions Precautions: Fall Recall of Precautions/Restrictions: Impaired Restrictions Weight Bearing Restrictions Per Provider Order: No     Mobility Bed Mobility Overal  bed mobility: Needs Assistance Bed Mobility: Supine to Sit, Sit to Supine     Supine to sit: Mod assist, HOB elevated Sit to supine: Mod assist   General bed mobility comments: Assist to facilitatetrunk and legs    Transfers Overall transfer level: Needs assistance Equipment used: Rolling walker (2 wheels) Transfers: Sit to/from Stand Sit to Stand: Mod assist           General transfer comment: Mod assist to boost to stand. Pt able to take lateral steps with mod assist for balance d/t posterior lean      Balance Overall balance assessment: Needs assistance Sitting-balance support: No upper extremity supported, Feet supported Sitting balance-Leahy Scale: Fair     Standing balance support: Single extremity supported, During functional activity Standing balance-Leahy Scale: Poor Standing balance comment: Reliant on RW and external support         ADL either performed or assessed with clinical judgement   ADL Overall ADL's : Needs assistance/impaired Eating/Feeding: Moderate assistance;Sitting   Grooming: Moderate assistance;Sitting   Upper Body Bathing: Maximal assistance;Sitting   Lower Body Bathing: Maximal assistance;Sit to/from stand   Upper Body Dressing : Moderate assistance;Sitting   Lower Body Dressing: Maximal assistance;Sit to/from stand   Toilet Transfer: Moderate assistance;Rolling walker (2 wheels)   Toileting- Clothing Manipulation and Hygiene: Total assistance;Sit to/from stand       Functional mobility during ADLs: Moderate assistance;Rolling walker (2 wheels) General ADL Comments: Limited by decreased ROM in BUE and tone in RUE.     Vision Baseline Vision/History: 0 No visual deficits Vision Assessment?: No apparent visual deficits Additional Comments: No apparent deficits, however pt's cog limiting formal assessment  Pertinent Vitals/Pain Pain Assessment Pain Assessment: Faces Pain Location: generalized Pain Descriptors  / Indicators: Discomfort Pain Intervention(s): Limited activity within patient's tolerance     Extremity/Trunk Assessment Upper Extremity Assessment Upper Extremity Assessment: RUE deficits/detail;LUE deficits/detail RUE Deficits / Details: significant flexor synergy contraction in all joints. Painful to touch. MAS 4/4 RUE: Shoulder pain with ROM RUE Sensation: decreased light touch;decreased proprioception RUE Coordination: decreased fine motor;decreased gross motor LUE Deficits / Details: Decreased shoulder AROM ~ 30 degrees. Pt with preference to cradle RUE. Pain with ROM LUE: Shoulder pain with ROM LUE Coordination: decreased fine motor;decreased gross motor   Lower Extremity Assessment Lower Extremity Assessment: Defer to PT evaluation   Cervical / Trunk Assessment Cervical / Trunk Assessment: Kyphotic   Communication Communication Communication: Impaired Factors Affecting Communication: Difficulty expressing self;Reduced clarity of speech   Cognition Arousal: Lethargic Behavior During Therapy: Flat affect Cognition: Cognition impaired, No family/caregiver present to determine baseline   Orientation impairments: Place, Time, Situation Awareness: Intellectual awareness impaired, Online awareness impaired Memory impairment (select all impairments): Short-term memory, Working memory Attention impairment (select first level of impairment): Focused attention Executive functioning impairment (select all impairments): Initiation, Organization, Sequencing, Reasoning, Problem solving OT - Cognition Comments: Pt only able to state her name and birthdate. Unable to state season. Delayed processing and initation. No family present to determine baseline impairments from CVA                 Following commands: Impaired Following commands impaired: Follows one step commands with increased time, Follows one step commands inconsistently     Cueing  General Comments   Cueing  Techniques: Verbal cues;Gestural cues;Tactile cues  Pt soiled in urine in bed, NT notified and assisted with change of sheets           Home Living Family/patient expects to be discharged to:: Private residence Living Arrangements: Spouse/significant other Available Help at Discharge: Family;Available PRN/intermittently Type of Home: Mobile home Home Access: Ramped entrance     Home Layout: One level     Bathroom Shower/Tub: Chief Strategy Officer: Handicapped height Bathroom Accessibility: Yes   Home Equipment: Agricultural Consultant (2 wheels);Cane - single point;BSC/3in1;Wheelchair - manual;Cane - quad;Grab bars - tub/shower   Additional Comments: Home info obtained from previous admission. Pt unable to confirm year or date      Prior Functioning/Environment Prior Level of Function : Needs assist       Mobility Comments: Reports use of cane at baseline ADLs Comments: Reports sister assists with ADLs at baseline    OT Problem List: Decreased strength;Decreased range of motion;Decreased activity tolerance;Impaired balance (sitting and/or standing);Decreased cognition;Decreased safety awareness;Decreased knowledge of use of DME or AE;Decreased knowledge of precautions;Impaired sensation;Impaired tone;Impaired UE functional use   OT Treatment/Interventions: Self-care/ADL training;Therapeutic exercise;Energy conservation;DME and/or AE instruction;Therapeutic activities;Balance training;Patient/family education      OT Goals(Current goals can be found in the care plan section)   Acute Rehab OT Goals Patient Stated Goal: None stated OT Goal Formulation: With patient Time For Goal Achievement: 10/28/24 Potential to Achieve Goals: Good   OT Frequency:  Min 2X/week       AM-PAC OT 6 Clicks Daily Activity     Outcome Measure Help from another person eating meals?: A Lot Help from another person taking care of personal grooming?: A Lot Help from another person  toileting, which includes using toliet, bedpan, or urinal?: Total Help from another person bathing (including washing, rinsing, drying)?: A Lot Help from another person  to put on and taking off regular upper body clothing?: A Lot Help from another person to put on and taking off regular lower body clothing?: A Lot 6 Click Score: 11   End of Session Equipment Utilized During Treatment: Rolling walker (2 wheels);Gait belt Nurse Communication: Mobility status  Activity Tolerance: Patient tolerated treatment well Patient left: in bed;with call bell/phone within reach;with bed alarm set;with nursing/sitter in room  OT Visit Diagnosis: Unsteadiness on feet (R26.81);Other abnormalities of gait and mobility (R26.89);Muscle weakness (generalized) (M62.81)                Time: 8544-8484 OT Time Calculation (min): 20 min Charges:  OT General Charges $OT Visit: 1 Visit OT Evaluation $OT Eval Moderate Complexity: 1 Mod  Adrianne BROCKS, OT  Acute Rehabilitation Services Office [phone] Secure chat preferred   Adrianne GORMAN Savers 10/14/2024, 4:28 PM

## 2024-10-14 NOTE — TOC Initial Note (Signed)
 Transition of Care [facility]) - Initial/Assessment Note    Patient Details  Name: [name] MRN: [id number] Date of Birth: [birth year]  Transition of Care [facility]) CM/SW Contact:    Darin JULIANNA Bend, LCSW Phone Number: 10/14/2024, 3:07 PM  Clinical Narrative:                 CSW followed-up disposition recommendations (SNF placement).  CSW completed initial TOC work-up/assessment as noted by the following below.   CSW spoke with the patient to review SNF referral process per clinical recommendations and assessed the pt's SNF preference.   The patient expressed no preference  The CSW contacted the patient's natural support ( ) and reviewed the clinical recommendations and assess SNF preference. The natural support expressed  CSW updated the bedside nurse and additional clinical members to the information above regarding SNF initial efforts for SNF placement.    CSW referral efforts to support the patient's disposition FL2:  PASRR:  SNF referrals:    TOC Disposition follow-up needs  Please provide the patient or natural support with bed-offer updates.  Please continue with SNF placement efforts. Please update the clinical team to SNF placement efforts:  No other needs identified by this clinical research associate currently. Patient needs and current disposition to be followed by      Barriers to Discharge: Continued Medical Work up   Patient Goals and CMS Choice Patient states their goals for this hospitalization and ongoing recovery are:: To get better and go home CMS Medicare.gov Compare Post Acute Care list provided to:: Patient Choice offered to / list presented to : [name] ownership interest in [facility].provided to:: Patient    Expected Discharge Plan and Services     Post Acute Care Choice: Skilled Nursing Facility Living arrangements for the past 2 months: Single Family Home                                      Prior Living  Arrangements/Services Living arrangements for the past 2 months: Single Family Home Lives with:: Self Patient language and need for interpreter reviewed:: No Do you feel safe going back to the place where you live?: Yes      Need for Family Participation in Patient Care: Yes (Comment) Care giver support system in place?: Yes (comment)   Criminal Activity/Legal Involvement Pertinent to Current Situation/Hospitalization: No - Comment as needed  Activities of Daily Living   ADL Screening (condition at time of admission) Independently performs ADLs?: Yes (appropriate for developmental age) Is the patient deaf or have difficulty hearing?: No Does the patient have difficulty seeing, even when wearing glasses/contacts?: No Does the patient have difficulty concentrating, remembering, or making decisions?: No  Permission Sought/Granted Permission sought to share information with : Magazine Features Editor, Case Estate Manager/land Agent granted to share information with : Yes, Designer, Fashion/clothing              Emotional Assessment       Orientation: : Oriented to Self Alcohol / Substance Use: Not Applicable Psych Involvement: No (comment)  Admission diagnosis:  Lactic acidosis [E87.20] SIRS (systemic inflammatory response syndrome) (HCC) [R65.10] Hematemesis, unspecified whether nausea present [K92.0] Patient Active Problem List   Diagnosis Date Noted   SIRS (systemic inflammatory response syndrome) (HCC) 10/11/2024   UTI (urinary tract infection) 01/06/2023   Hypoalbuminemia due to protein-calorie malnutrition 01/06/2023   History of seizures 01/06/2023  Acute hyperkalemia 08/05/2022   Transaminitis 08/04/2022   Hypomagnesemia 11/20/2021   Subtherapeutic serum dilantin  level    Seizure-like activity (HCC) 11/19/2021   Essential hypertension 06/16/2021   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HCC) 06/16/2021   Acute lower UTI 05/28/2021   Severe sepsis (HCC) 05/28/2021   Lactic acidosis  12/19/2020   Encephalopathy acute 12/18/2020   Acute metabolic encephalopathy 04/02/2020   ABLA (acute blood loss anemia) 11/02/2019   Overweight (BMI 25.0-29.9) 11/02/2019   Gait disturbance    Rectal bleeding 10/08/2019   Hyponatremia    Diarrhea 04/11/2019   Nausea, vomiting, and diarrhea    ARF (acute renal failure) 04/10/2019   Acute kidney injury 01/22/2019   History of stroke 01/22/2019   Asthma 01/22/2019   Gastroenteritis 01/22/2019   Stroke (HCC) 06/14/2018   Cirrhosis of liver without ascites (HCC) 04/21/2017   AKI (acute kidney injury) 07/30/2016   Anemia 11/25/2015   Abnormal liver function    Coffee ground emesis    Upper GI bleed 10/18/2014   Hematemesis 10/18/2014   Reflux esophagitis    Sinus tachycardia (HCC) 10/10/2014   Hypokalemia 10/10/2014   Seizure disorder (HCC) 10/10/2014   Alcohol abuse 09/05/2012   Odynophagia 09/05/2012   GERD (gastroesophageal reflux disease) 09/05/2012   Abdominal pain 09/05/2012   Closed fracture of tibia 07/27/2007   STROKE-With also h/o Aneurysm clipping? 07/25/2007   Seizures (HCC) 07/25/2007   PCP:  Carlette Benita Area, MD Pharmacy:   [facility] [place], [place] - [address] [place] [address] Phone: [phone] Fax: [phone]  [facility] [place], [place] - [address] [place] [address] Phone: [phone] Fax: [phone]     Social Drivers of Health (SDOH) Social History: SDOH Screenings   Food Insecurity: No Food Insecurity (10/12/2024)  Housing: High Risk (10/12/2024)  Transportation Needs: No Transportation Needs (10/12/2024)  Utilities: Not At Risk (10/12/2024)  Social Connections: Moderately Isolated (10/12/2024)  Tobacco Use: Medium Risk (10/11/2024)   SDOH Interventions:     Readmission Risk Interventions    10/12/2024    8:40 AM  Readmission Risk Prevention Plan  Transportation Screening Complete  PCP or Specialist Appt  within 5-7 Days Complete  Home Care Screening Complete  Medication Review (RN CM) Complete

## 2024-10-14 NOTE — Evaluation (Signed)
 Clinical/Bedside Swallow Evaluation Patient Details  Name: [name] MRN: [id number] Date of Birth: [birth year]  Today's Date: 10/14/2024 Time: SLP Start Time (ACUTE ONLY): 1326 SLP Stop Time (ACUTE ONLY): 1337 SLP Time Calculation (min) (ACUTE ONLY): 11 min  Past Medical History:  Past Medical History:  Diagnosis Date   Alcohol abuse    Asthma    Contracture of muscle of hand    right   GERD (gastroesophageal reflux disease)    HTN (hypertension)    PUD (peptic ulcer disease)    remote   Seizures (HCC)    since stroke, no recent seizures   Sleep apnea    Stroke [facility])    age 70, required brain surgery effected right side   Past Surgical History:  Past Surgical History:  Procedure Laterality Date   ABDOMINAL HYSTERECTOMY  2004   complete   BIOPSY  10/06/2022   Procedure: BIOPSY;  Surgeon: Cindie Carlin POUR, DO;  [facility];  Service: Endoscopy;;   BRAIN SURGERY     age 86 stroke   COLONOSCOPY     COLONOSCOPY WITH PROPOFOL   10/03/2012   Dr. Harvey: moderate diverticulosis, small internal hemorrhoids, TUBULAR ADENOMA. Next colonoscopy 2023 per Dr. harvey.   COLONOSCOPY WITH PROPOFOL  N/A 10/06/2022   Procedure: COLONOSCOPY WITH PROPOFOL ;  Surgeon: Cindie Carlin POUR, DO;  Location: [facility];  Service: Endoscopy;  Laterality: N/A;  9:15 am   ESOPHAGOGASTRODUODENOSCOPY N/A 10/18/2014   Dr. Shaaron during inpatient hospitalization: severe exudative/erosive reflux esophagitis as source of trivial upper GI bleed. No varices    ESOPHAGOGASTRODUODENOSCOPY (EGD) WITH PROPOFOL   10/03/2012   Dr. Harvey: stricture at GE junction s/p dilation, mild gastritis negative H.pylori    ESOPHAGOGASTRODUODENOSCOPY (EGD) WITH PROPOFOL  N/A 12/27/2017   Grade D esophagitis. Medium-sized hiatal hernia, normal duodenum   ESOPHAGOGASTRODUODENOSCOPY (EGD) WITH PROPOFOL  N/A 10/06/2022   Procedure: ESOPHAGOGASTRODUODENOSCOPY (EGD) WITH PROPOFOL ;  Surgeon: Cindie Carlin POUR, DO;   [facility];  Service: Endoscopy;  Laterality: N/A;   POLYPECTOMY  10/06/2022   Procedure: POLYPECTOMY;  Surgeon: Cindie Carlin POUR, DO;  [facility]: [facility];  Service: Endoscopy;;   right tib-fib fracture  2008   SAVORY DILATION  10/03/2012   Procedure: SAVORY DILATION;  Surgeon: Margo LITTIE Harvey, MD;  Location: [facility];  Service: Endoscopy;  Laterality: N/A;  started at 1303,  dilated 12.8-16mm   HPI:  Pt is a 68 year old female who presented on 12/25 from home with altered mental status; found by family lying on the floor with dark emesis. CT abdomen: Circumferential wall thickening distal esophagus with fluid in the distal esophagus suggesting reflux. Imaging features compatible with esophagitis. GI consulted and plan for EGD on 12/29 or 12/30. SLP consulted 12/28 after episode of emesis after swallowing oral meds. PMH: IBS, asthma, stroke, seizure disorder, peptic ulcer disease, alcohol abuse, hypertension, GERD, severe erosive esophagitis /upper GI bleed in 2016 and a 3 cm hiatal hernia.    Assessment / Plan / Recommendation  Clinical Impression  Pt was seen for bedside swallow evaluation after reported episode of emesis after taking pills. Pt did not present as a reliable historian; her responses to questions were inconsistent and intermittently contradictory. Oral mechanism exam was limited due to pt's difficulty following all the necessary commands; however, oral motor strength and ROM appeared grossly WFL and dentition was reduced. Trials were limited to clear liquids per GI's recommendation. She tolerated all boluses without signs or symptoms of oropharyngeal dysphagia. Eructation was noted after  all trials, suggesting possible esophageal dysphagia.  Pt is scheduled to have an EGD tomorrow. It is recommended that the pt's current diet be continued. Further skilled SLP services do not appear to be clinically indicated at this time, but please reconsult if needs arise. SLP Visit  Diagnosis: Dysphagia, unspecified (R13.10)    Aspiration Risk  Mild aspiration risk    Diet Recommendation           Other Recommendations Oral Care Recommendations: Oral care BID     Swallow Evaluation Recommendations Recommendations: PO diet PO Diet Recommendation: Clear liquid diet Liquid Administration via: Cup;Straw Medication Administration:  (per GI rx; may take whole from an oropharyngeal standpoint) Supervision: Staff to assist with self-feeding   Assistance Recommended at Discharge    Functional Status Assessment Patient has not had a recent decline in their functional status  Frequency and Duration            Prognosis        Swallow Study   General Date of Onset: 10/14/24 HPI: Pt is a 68 year old female who presented on 12/25 from home with altered mental status; found by family lying on the floor with dark emesis. CT abdomen: Circumferential wall thickening distal esophagus with fluid in the distal esophagus suggesting reflux. Imaging features compatible with esophagitis. GI consulted and plan for EGD on 12/29 or 12/30. SLP consulted 12/28 after episode of emesis after swallowing oral meds. PMH: IBS, asthma, stroke, seizure disorder, peptic ulcer disease, alcohol abuse, hypertension, GERD, severe erosive esophagitis /upper GI bleed in 2016 and a 3 cm hiatal hernia. Type of Study: Bedside Swallow Evaluation Previous Swallow Assessment: None Diet Prior to this Study: Clear liquid diet Temperature Spikes Noted: No Respiratory Status: Room air History of Recent Intubation: No Behavior/Cognition: Alert;Cooperative;Pleasant mood Oral Cavity Assessment: Within Functional Limits Oral Care Completed by SLP: No Oral Cavity - Dentition: Adequate natural dentition;Missing dentition;Poor condition Vision: Functional for self-feeding Self-Feeding Abilities: Needs assist Patient Positioning: Upright in bed Baseline Vocal Quality: Normal Volitional Cough: Cognitively unable  to elicit Volitional Swallow: Able to elicit    Oral/Motor/Sensory Function Overall Oral Motor/Sensory Function: Within functional limits   Ice Chips Ice chips: Within functional limits Presentation: Spoon   Thin Liquid Thin Liquid: Within functional limits Presentation: Cup;Straw    Nectar Thick Nectar Thick Liquid: Not tested   Honey Thick Honey Thick Liquid: Not tested   Puree Puree: Not tested   Solid     Solid: Not tested     Thea I. Orlando, MS, CCC-SLP Acute Rehabilitation Services Office number [phone]  Thea LILLETTE Orlando 10/14/2024,2:53 PM

## 2024-10-14 NOTE — Progress Notes (Signed)
 Pt throw up when she swallowed  oral meds, notified Dr Mennie LAMY, and ordered swallow evaluation (verbal order). Swallw eval placed.

## 2024-10-14 NOTE — Progress Notes (Addendum)
 "    Daily Progress Note  DOA: 10/11/2024 Hospital Day: 4  Cc: Cirrhosis, coffee ground emesis with anemia  ASSESSMENT  & PLAN   68 year old female with alcohol associated cirrhosis , (appears compensated) transferred from [facility] encephalopathic, hypotensive on pressors with coffee-ground emesis. She is confused, being treated for Etoh withdrawal. Bleeding possibly 2/2 to PUD, esophagitis, AVMs, possibly portal hypertensive gastropathy  MELD 3.0: 11 at 10/12/2024  6:04 PM MELD-Na: 8 at 10/12/2024  6:04 PM  Today:  Off pressors, no further bleeding. Still confused but probably mostly from HE.  Last few CIWA scores 0-1 Needs EGD, tomorrow vrs Tuesday.  Patient remains confused, unable to consent for procedure. I called sister Zettie Fetters, [place], and discussed the risks and benefits of EGD with possible biopsies and she would like for us  to proceed.  WILL MAKE NPO AFTER MIDNIGHT. ADD LACTULOSE  TODAY AND REASSESS IN AM FOR POTENTIAL EGD Continue BID IV Pantoprazole  Will add lactulose   Will d/c Octreotide  infusion tomorrow   Continue thiamine , folic acid  Continue antibiotics ( total of 5 days for empiric coverage cirrhotic patient with GI bleed) Continue Boost / Breeze  Acute on chronic anemia Baseline hgb 11 Hgb 6.7 with coffee ground emesis and probably IVF.  Transfused only ONE unit RBCs early 12/27 and interestingly hgb has steadily risen to 10.2 today  Abnormal esophagus on CT scan ( this admit) CT >>Circumferential wall thickening distal esophagus with fluid in the distal esophagus suggesting reflux. Imaging features compatible with esophagitis.   History of GERD, severe erosive esophagitis /upper GI bleed in 2016 and a 3 cm hiatal hernia   History of CVA at age 21   Seizure disorder On Dilantin   Principal Problem:   SIRS (systemic inflammatory response syndrome) (HCC) Active Problems:   Alcohol abuse   Hypokalemia   Upper GI bleed   Hematemesis   ABLA (acute  blood loss anemia)   Lactic acidosis   Transaminitis   Subjective No physical complaints  Objective      Recent Labs    10/12/24 1804 10/12/24 2020 10/13/24 0315 10/13/24 0851 10/13/24 2250 10/14/24 0519 10/14/24 0824  WBC 4.9  --  4.7  --   --  3.7*  --   HGB 6.7*   < > 8.5*   < > 9.2* 9.3* 10.2*  HCT 19.9*   < > 24.6*   < > 28.5* 28.1* 30.7*  MCV 89.2  --  88.5  --   --  91.2  --   PLT 68*  --  62*  --   --  61*  --    < > = values in this interval not displayed.   Recent Labs    10/11/24 1527  FOLATE 6.7  VITAMINB12 1,612*   Recent Labs    10/12/24 0023 10/12/24 1804 10/14/24 0519  NA 136 142 148*  K 3.9 3.5 3.2*  CL 96* 107 109  CO2 28 30 32  GLUCOSE 132* 124* 137*  BUN 17 13 7*  CREATININE 0.89 0.75 0.62  CALCIUM 8.1* 8.1* 8.3*   Recent Labs    10/12/24 0023 10/12/24 1804 10/14/24 0519  PROT 5.4* 4.8* 5.2*  ALBUMIN 2.9* 2.6* 2.7*  AST 68* 47* 46*  ALT 25 18 18   ALKPHOS 82 74 81  BILITOT 1.0 0.8 1.0    Scheduled inpatient medications:   Chlorhexidine  Gluconate Cloth  6 each Topical Q0600   feeding supplement  1 Container Oral TID BM   folic acid   1 mg Oral Daily   multivitamin with minerals  1 tablet Oral Daily   pantoprazole  (PROTONIX ) IV  40 mg Intravenous Q12H   phenobarbital   32.4 mg Oral BID   phenytoin   100 mg Oral TID   potassium chloride   40 mEq Oral Q3H   sodium chloride  flush  10-40 mL Intracatheter Q12H   thiamine   100 mg Oral Daily   Continuous inpatient infusions:   ceFEPime  (MAXIPIME ) IV 2 g (10/14/24 0517)   octreotide  (SANDOSTATIN ) 500 mcg in sodium chloride  0.9 % 250 mL (2 mcg/mL) infusion 50 mcg/hr (10/13/24 2041)   PRN inpatient medications: LORazepam , LORazepam  **OR** LORazepam , ondansetron  **OR** ondansetron  (ZOFRAN ) IV, sodium chloride  flush  Vital signs in last 24 hours: Temp:  [97.7 F (36.5 C)-98.4 F (36.9 C)] 98.4 F (36.9 C) (12/28 1001) Pulse Rate:  [81-104] 93 (12/28 1001) Resp:  [15-24] 16 (12/28  1001) BP: (106-156)/(53-79) 156/71 (12/28 1001) SpO2:  [90 %-100 %] 98 % (12/28 1001) Last BM Date : 10/13/24  Intake/Output Summary (Last 24 hours) at 10/14/2024 1156 Last data filed at 10/14/2024 1008 Gross per 24 hour  Intake 1920.48 ml  Output 1600 ml  Net 320.48 ml    Intake/Output from previous day: 12/27 0701 - 12/28 0700 In: 2554.8 [P.O.:1320; I.V.:1134.8; IV Piggyback:100] Out: 1300 [Urine:1300] Intake/Output this shift: Total I/O In: 3 [I.V.:3] Out: 700 [Urine:700]   Physical Exam:  General: Alert female in NAD Heart:  Regular rate and rhythm.  Pulmonary: Normal respiratory effort Abdomen: Soft, nondistended, nontender. Normal bowel sounds. Extremities: No lower extremity edema  Neurologic: Alert , no oriented to place, time, year, +  asterixis Psych: Pleasant. Cooperative     LOS: 3 days   Vina Dasen ,NP 10/14/2024, 11:56 AM       "

## 2024-10-14 NOTE — Progress Notes (Signed)
.. ° ° °  PROCEDURAL EXPEDITER PROGRESS NOTE  Patient Name: [name]  DOB:1956-02-02 Date of Admission: 10/11/2024  Date of Assessment:10/14/2024   -------------------------------------------------------------------------------------------------------------------   Brief clinical summary: Pt to Endo tomorrow to have Esophagogastroduodenoscopy   Orders in place:  Yes   Labs, test, and orders reviewed: Y  Requires surgical clearance:  No  Barriers noted:N/A  -------------------------------------------------------------------------------------------------------------------  [facility], [place], [place] Please contact us  directly via secure chat (search for [facility]) or by calling us  at [phone] [facility]).

## 2024-10-14 NOTE — Progress Notes (Signed)
 " PROGRESS NOTE [name]  FMW:[id number] DOB: [birth year] DOA: 10/11/2024 PCP: Carlette Benita Area, MD  Brief Narrative/Hospital Course: [name] is a 68 y.o. female with PMH of IBS, asthma, stroke, seizure disorder, peptic ulcer disease, alcohol abuse, last seen by family drinking and dependence of alcohol in the evening of 12/24 and was found by family at home lying on the floor with dark emesis, seen at [facility] 12/25 12/25 In the ZI:jqzampoz,pwpupjoob hypotensive  62/44. S/p fluid resuscitated with 2.5 L of fluids.  He was started on ceftriaxone  and metronidazole .  Her blood pressure improved.  WBC 6.8, hemoglobin 1.7, platelets 216.  AST 94, ALT 35, alk phosphatase 123, total bilirubin 0.9.  Sodium 134, potassium 2.4, bicarbonate 26, serum creatinine 1.33.  Lipase 44. CT abdomen and pelvis>>negative for any acute findings. Chest x-ray>>negative for any acute findings. CT of the brain>>negative for any acute findings but showed previous aneurysmal clip. EKG showed sinus tachycardia.  Troponin 38.  Lactic acid >9 Despite aggressive fluid [name] patient was hypotensive and needed pressors  GI was consulted started on octreotide  infusion and pantoprazole  bid and empiric vancomycin  and cefepime . 12/26>transferred to [facility], ICU.  Placed on phenobarbital  taper for alcohol withdrawal remains on octreotide , treated for shock S/P 1 unit transfusion, lactic acidosis improved. 12/28> transferred to [facility] service  Subjective: Seen and examined today Alert awake resting comfortably She did have episode of vomiting this morning Overnight  afebrile, VSS, Labs mild hypokalemia 3.2 stable electrolytes albumin 2.7 hemoglobin stable 9.3 platelets 61  Assessment and plan:   Severe alcohol abuse Alcohol withdrawal: Continue on phenobarbital  taper as ordered by PCCM team, folate thiamine  and CIWA Ativan .  Last few CIWA scores 0-1 TOC consult for cessation resources  Dark  Emesis Concern for upper GI bleeding ABLA on chronic anemia Alcoholic liver cirrhosis: Remains on octreotide  drip, PPI twice daily and empiric cefepime .GI following unable to do EGD today- gi advised CLD and NPO past MN   EGD in 2023 no evidence stigmata of portal hypertension, but varices or portal hypertensive gastropathy could have evolved in the interim. Hb as low as 6.7 this admit s/p 1 u prbc-baseline 10-11 ~ 2 moth ago  Monitor hemoglobin remains stable. Having some vomiting a speech eval consulted, will add IV fluids slightly, recheck H&H has improved Recent Labs    10/11/24 1527 10/11/24 1837 10/13/24 0315 10/13/24 0851 10/13/24 1445 10/13/24 2250 10/14/24 0519 10/14/24 0824  HGB  --    < > 8.5* 7.8* 8.9* 9.2* 9.3* 10.2*  MCV  --    < > 88.5  --   --   --  91.2  --   VITAMINB12 1,612*  --   --   --   --   --   --   --   FOLATE 6.7  --   --   --   --   --   --   --    < > = values in this interval not displayed.   Thrombocytopenia Leukopenia: WBC and platelet count drifting suspect in the setting of acute illness liver cirrhosis.  Monitor Recent Labs  Lab 10/12/24 1804 10/12/24 2020 10/13/24 0315 10/13/24 0851 10/13/24 2250 10/14/24 0519 10/14/24 0824  HGB 6.7*   < > 8.5*   < > 9.2* 9.3* 10.2*  HCT 19.9*   < > 24.6*   < > 28.5* 28.1* 30.7*  WBC 4.9  --  4.7  --   --  3.7*  --  PLT 68*  --  62*  --   --  61*  --    < > = values in this interval not displayed.     Undifferentiated shock Hypovolemic shock Possible hemorrhagic shock: Vital signs stabilized post fluid resuscitation and pressors and blood transfusion. Monitor  Hypokalemia: Replace iv due to vomiting.  Nursing reports patient threw up the pill  Lactic acidosis up to >9 In the setting of alcohol abuse hypovolemia.  Level 5.6 on 12/26 In ICU   Seizure disorder Cont dilantin   Moderate malnutrition Hypoalbuminemia: Augment diet as tolerated   Deconditioning/debility/weakness Continue PT OT  recommending SNF as below. PT Orders: Active PT Follow up Rec: Skilled Nursing-Short Term Rehab (<3 Hours/Day) (Pending Progress)10/13/2024 1600   DVT prophylaxis: SCDs Start: 10/11/24 1354 Code Status:   Code Status: Full Code Family Communication: plan of care discussed with patient at bedside. Patient status is: Remains hospitalized because of severity of illness Level of care: Med-Surg   Dispo: The patient is from: home            Anticipated disposition: TBD  Objective: Vitals last 24 hrs: Vitals:   10/13/24 2131 10/14/24 0016 10/14/24 0444 10/14/24 1001  BP: 135/62 127/65 138/69 (!) 156/71  Pulse: 97 98 (!) 104 93  Resp: 20 18 18 16   Temp: 98.2 F (36.8 C) 98.3 F (36.8 C) 98 F (36.7 C) 98.4 F (36.9 C)  TempSrc: Oral Oral Oral Oral  SpO2: 100% 95% 100% 98%  Weight:      Height:        Physical Examination: General exam: alert awake, oriented, older than her age thin HEENT:Oral mucosa moist, Ear/Nose WNL grossly Respiratory system: Bilaterally clear BS,no use of accessory muscle Cardiovascular system: S1 & S2 +, No JVD. Gastrointestinal system: Abdomen soft,NT,ND, BS+ Nervous System: Alert, awake, moving all extremities,and following commands. Extremities: extremities warm, leg edema neg Skin: Warm, no rashes MSK: Normal muscle bulk,tone, power   Medications reviewed:  Scheduled Meds:  Chlorhexidine  Gluconate Cloth  6 each Topical Q0600   feeding supplement  1 Container Oral TID BM   folic acid   1 mg Oral Daily   multivitamin with minerals  1 tablet Oral Daily   pantoprazole  (PROTONIX ) IV  40 mg Intravenous Q12H   phenobarbital   32.4 mg Oral BID   phenytoin   100 mg Oral TID   potassium chloride   40 mEq Oral Q3H   sodium chloride  flush  10-40 mL Intracatheter Q12H   thiamine   100 mg Oral Daily   Continuous Infusions:  ceFEPime  (MAXIPIME ) IV 2 g (10/14/24 0517)   octreotide  (SANDOSTATIN ) 500 mcg in sodium chloride  0.9 % 250 mL (2 mcg/mL) infusion 50  mcg/hr (10/13/24 2041)   Diet: Diet Order             Diet NPO time specified  Diet effective midnight           Diet clear liquid Room service appropriate? Yes; Fluid consistency: Thin  Diet effective now                  Data Reviewed: I have personally reviewed following labs and imaging studies ( see epic result tab) CBC: Recent Labs  Lab 10/11/24 0807 10/11/24 1837 10/12/24 0023 10/12/24 1804 10/12/24 2020 10/13/24 0315 10/13/24 0851 10/13/24 1445 10/13/24 2250 10/14/24 0519 10/14/24 0824  WBC 6.8 10.4 13.7* 4.9  --  4.7  --   --   --  3.7*  --   NEUTROABS 5.7  --   --   --   --   --   --   --   --  2.2  --   HGB 11.7* 9.3* 8.2* 6.7*   < > 8.5* 7.8* 8.9* 9.2* 9.3* 10.2*  HCT 33.8* 27.1* 23.5* 19.9*   < > 24.6* 23.9* 26.5* 28.5* 28.1* 30.7*  MCV 86.0 89.7 88.3 89.2  --  88.5  --   --   --  91.2  --   PLT 216 129* 120* 68*  --  62*  --   --   --  61*  --    < > = values in this interval not displayed.   CMP: Recent Labs  Lab 10/11/24 0805 10/11/24 1837 10/12/24 0023 10/12/24 1804 10/14/24 0519  NA 134*  --  136 142 148*  K 2.4* 3.9 3.9 3.5 3.2*  CL 80*  --  96* 107 109  CO2 26  --  28 30 32  GLUCOSE 157*  --  132* 124* 137*  BUN 13  --  17 13 7*  CREATININE 1.33*  --  0.89 0.75 0.62  CALCIUM 9.3  --  8.1* 8.1* 8.3*  MG  --  1.2* 3.5* 2.0  --   PHOS  --   --  2.1* 1.4*  --    GFR: Estimated Creatinine Clearance: 58.9 mL/min (by C-G formula based on SCr of 0.62 mg/dL). Recent Labs  Lab 10/11/24 0805 10/12/24 0023 10/12/24 1804 10/14/24 0519  AST 94* 68* 47* 46*  ALT 35 25 18 18   ALKPHOS 123 82 74 81  BILITOT 0.9 1.0 0.8 1.0  PROT 7.7 5.4* 4.8* 5.2*  ALBUMIN 4.0 2.9* 2.6* 2.7*    Recent Labs  Lab 10/11/24 0805  LIPASE 44    Recent Labs  Lab 10/12/24 1700  AMMONIA 21   Coagulation Profile:  Recent Labs  Lab 10/11/24 0909  INR 1.2   Unresulted Labs (From admission, onward)     Start     Ordered   10/15/24 0500  Basic metabolic  panel with GFR  Daily,   R     Question:  Specimen collection method  Answer:  Unit=Unit collect   10/14/24 0834   10/15/24 0500  CBC  Daily,   R     Question:  Specimen collection method  Answer:  Unit=Unit collect   10/14/24 0834   10/11/24 1435  MRSA Next Gen by PCR, Nasal  Once,   R       Question Answer Comment  Patient immune status Normal   Release to patient Immediate      10/11/24 1434           Antimicrobials/Microbiology: Anti-infectives (From admission, onward)    Start     Dose/Rate Route Frequency Ordered Stop   10/12/24 1500  vancomycin  (VANCOREADY) IVPB 750 mg/150 mL  Status:  Discontinued        750 mg 150 mL/hr over 60 Minutes Intravenous Every 24 hours 10/11/24 1422 10/12/24 1143   10/12/24 1400  vancomycin  (VANCOCIN ) IVPB 1000 mg/200 mL premix  Status:  Discontinued        1,000 mg 200 mL/hr over 60 Minutes Intravenous Every 24 hours 10/12/24 1143 10/12/24 1505   10/11/24 1600  ceFEPIme  (MAXIPIME ) 2 g in sodium chloride  0.9 % 100 mL IVPB        2 g 200 mL/hr over 30 Minutes Intravenous Every 12 hours 10/11/24 1422     10/11/24 1515  vancomycin  (VANCOCIN ) IVPB 1000 mg/200 mL premix        1,000 mg 200 mL/hr over 60 Minutes Intravenous  Once 10/11/24 1422 10/11/24 1546   10/11/24 1100  metroNIDAZOLE  (FLAGYL ) IVPB 500 mg        500 mg 100 mL/hr over 60 Minutes Intravenous  Once 10/11/24 1054 10/11/24 1321   10/11/24 0745  cefTRIAXone  (ROCEPHIN ) 2 g in sodium chloride  0.9 % 100 mL IVPB        2 g 200 mL/hr over 30 Minutes Intravenous  Once 10/11/24 0734 10/11/24 0951         Component Value Date/Time   SDES  10/11/2024 0909    BLOOD LEFT HAND BOTTLES DRAWN AEROBIC AND ANAEROBIC   SPECREQUEST Blood Culture adequate volume 10/11/2024 0909   CULT  10/11/2024 0909    NO GROWTH 3 DAYS Performed at [facility], [address] [place], [place] [address]    REPTSTATUS PENDING 10/11/2024 9090    Procedures: Procedures (LRB): EGD  (ESOPHAGOGASTRODUODENOSCOPY) (N/A)   Mennie LAMY, MD [facility] 10/14/2024, 12:10 PM   "

## 2024-10-14 NOTE — Progress Notes (Signed)
" °   10/14/24 2038  Assess: MEWS Score  Temp 98.9 F (37.2 C)  BP 128/84  MAP (mmHg) 97  Pulse Rate (!) 126  Level of Consciousness Alert  SpO2 93 %  O2 Device Room Air  Assess: MEWS Score  MEWS Temp 0  MEWS Systolic 0  MEWS Pulse 2  MEWS RR 0  MEWS LOC 0  MEWS Score 2  MEWS Score Color Yellow  Assess: if the MEWS score is Yellow or Red  Were vital signs accurate and taken at a resting state? Yes  Does the patient meet 2 or more of the SIRS criteria? No  MEWS guidelines implemented  No, previously yellow, continue vital signs every 4 hours  Provider Notification  Provider Name/Title Blondie NP  Date Provider Notified 10/14/24  Time Provider Notified 2334  Method of Notification Page (Via secure chat)  Notification Reason Other (Comment) (Yellow MEWS)  Provider response See new orders  Date of Provider Response 10/14/24  Time of Provider Response 2334  Assess: SIRS CRITERIA  SIRS Temperature  0  SIRS Respirations  0  SIRS Pulse 1  SIRS WBC 0  SIRS Score Sum  1    "

## 2024-10-14 NOTE — Progress Notes (Signed)
 Spoke to Lab Phlebotomist Kaylin to follow up the blood draw for Phosphorus for 1727. And she will arrange Phlebotomist to go on the floor.

## 2024-10-14 NOTE — NC FL2 (Signed)
 " [place]  MEDICAID FL2 LEVEL OF CARE FORM     IDENTIFICATION  Patient Name: RENAY CRAMMER Birthdate: 11/09/1955 Sex: female Admission Date (Current Location): 10/11/2024  [facility] and [place] Number:  Producer, Television/film/video and Address:  [facility], [address], [place], [place] [address]      Provider Number: [id number]  Attending Physician Name and Address:  Christobal Guadalajara, MD  Relative Name and Phone Number:       Current Level of Care: Hospital Recommended Level of Care: Skilled Nursing Facility Prior Approval Number:    Date Approved/Denied:   PASRR Number: [id number]  Discharge Plan: SNF    Current Diagnoses: Patient Active Problem List   Diagnosis Date Noted   SIRS (systemic inflammatory response syndrome) (HCC) 10/11/2024   UTI (urinary tract infection) 01/06/2023   Hypoalbuminemia due to protein-calorie malnutrition 01/06/2023   History of seizures 01/06/2023   Acute hyperkalemia 08/05/2022   Transaminitis 08/04/2022   Hypomagnesemia 11/20/2021   Subtherapeutic serum dilantin  level    Seizure-like activity (HCC) 11/19/2021   Essential hypertension 06/16/2021   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HCC) 06/16/2021   Acute lower UTI 05/28/2021   Severe sepsis (HCC) 05/28/2021   Lactic acidosis 12/19/2020   Encephalopathy acute 12/18/2020   Acute metabolic encephalopathy 04/02/2020   ABLA (acute blood loss anemia) 11/02/2019   Overweight (BMI 25.0-29.9) 11/02/2019   Gait disturbance    Rectal bleeding 10/08/2019   Hyponatremia    Diarrhea 04/11/2019   Nausea, vomiting, and diarrhea    ARF (acute renal failure) 04/10/2019   Acute kidney injury 01/22/2019   History of stroke 01/22/2019   Asthma 01/22/2019   Gastroenteritis 01/22/2019   Stroke (HCC) 06/14/2018   Cirrhosis of liver without ascites (HCC) 04/21/2017   AKI (acute kidney injury) 07/30/2016   Anemia 11/25/2015   Abnormal liver function    Coffee ground emesis    Upper GI bleed  10/18/2014   Hematemesis 10/18/2014   Reflux esophagitis    Sinus tachycardia (HCC) 10/10/2014   Hypokalemia 10/10/2014   Seizure disorder (HCC) 10/10/2014   Alcohol abuse 09/05/2012   Odynophagia 09/05/2012   GERD (gastroesophageal reflux disease) 09/05/2012   Abdominal pain 09/05/2012   Closed fracture of tibia 07/27/2007   STROKE-With also h/o Aneurysm clipping? 07/25/2007   Seizures (HCC) 07/25/2007    Orientation RESPIRATION BLADDER Height & Weight     Self  Normal Incontinent Weight: 137 lb 2 oz (62.2 kg) Height:  5' 4 (162.6 cm)  BEHAVIORAL SYMPTOMS/MOOD NEUROLOGICAL BOWEL NUTRITION STATUS      Continent Diet  AMBULATORY STATUS COMMUNICATION OF NEEDS Skin     Verbally                         Personal Care Assistance Level of Assistance              Functional Limitations Info  Sight Sight Info: Adequate        SPECIAL CARE FACTORS FREQUENCY  PT (By licensed PT), OT (By licensed OT)     PT Frequency: 5x wk OT Frequency: 3x wk            Contractures Contractures Info: Not present    Additional Factors Info  Code Status Code Status Info: Full             Current Medications (10/14/2024):  This is the current hospital active medication list Current Facility-Administered Medications  Medication Dose Route Frequency Provider  Last Rate Last Admin   0.9 %  sodium chloride  infusion   Intravenous Continuous Kerman Vina HERO, NP       ceFEPIme  (MAXIPIME ) 2 g in sodium chloride  0.9 % 100 mL IVPB  2 g Intravenous Q12H Tat, David, MD 200 mL/hr at 10/14/24 0517 2 g at 10/14/24 9482   Chlorhexidine  Gluconate Cloth 2 % PADS 6 each  6 each Topical Q0600 Evonnie Lenis, MD   6 each at 10/14/24 0545   feeding supplement (BOOST / RESOURCE BREEZE) liquid 1 Container  1 Container Oral TID BM Guenther, Paula M, NP   1 Container at 10/14/24 1445   folic acid  (FOLVITE ) tablet 1 mg  1 mg Oral Daily Millen, Jessica B, RPH   1 mg at 10/14/24 1007   lactated ringers   1,000 mL with potassium chloride  20 mEq infusion   Intravenous Continuous Christobal Guadalajara, MD 50 mL/hr at 10/14/24 1323 New Bag at 10/14/24 1323   lactulose  (CHRONULAC ) 10 GM/15ML solution 20 g  20 g Oral TID Guenther, Paula M, NP       LORazepam  (ATIVAN ) injection 1 mg  1 mg Intravenous PRN Tat, Lenis, MD       LORazepam  (ATIVAN ) tablet 1-4 mg  1-4 mg Oral Q1H PRN Tat, Lenis, MD       Or   LORazepam  (ATIVAN ) injection 1-4 mg  1-4 mg Intravenous Q1H PRN Evonnie Lenis, MD   2 mg at 10/13/24 0516   multivitamin with minerals tablet 1 tablet  1 tablet Oral Daily Millen, Jessica B, RPH   1 tablet at 10/14/24 1007   octreotide  (SANDOSTATIN ) 500 mcg in sodium chloride  0.9 % 250 mL (2 mcg/mL) infusion  50 mcg/hr Intravenous Continuous Tat, Lenis, MD 25 mL/hr at 10/13/24 2041 50 mcg/hr at 10/13/24 2041   ondansetron  (ZOFRAN ) tablet 4 mg  4 mg Oral Q6H PRN Tat, Lenis, MD       Or   ondansetron  (ZOFRAN ) injection 4 mg  4 mg Intravenous Q6H PRN Evonnie Lenis, MD   4 mg at 10/11/24 2203   pantoprazole  (PROTONIX ) injection 40 mg  40 mg Intravenous Q12H Tat, David, MD   40 mg at 10/14/24 1009   PHENobarbital  (LUMINAL) tablet 32.4 mg  32.4 mg Oral BID Millen, Jessica B, RPH   32.4 mg at 10/14/24 1007   phenytoin  (DILANTIN ) ER capsule 100 mg  100 mg Oral TID Millen, Jessica B, RPH   100 mg at 10/14/24 1007   potassium chloride  10 mEq in 100 mL IVPB  10 mEq Intravenous Q1 Hr x 2 Kc, Ramesh, MD 100 mL/hr at 10/14/24 1441 10 mEq at 10/14/24 1441   sodium chloride  flush (NS) 0.9 % injection 10-40 mL  10-40 mL Intracatheter Q12H Evonnie Lenis, MD   10 mL at 10/14/24 1008   sodium chloride  flush (NS) 0.9 % injection 10-40 mL  10-40 mL Intracatheter PRN Tat, Lenis, MD       thiamine  (VITAMIN B1) tablet 100 mg  100 mg Oral Daily Millen, Jessica B, RPH   100 mg at 10/14/24 1006     Discharge Medications: Please see discharge summary for a list of discharge medications.  Relevant Imaging Results:  Relevant Lab  Results:   Additional Information SS: [id number]  Darin JULIANNA Bend, LCSW     "

## 2024-10-15 ENCOUNTER — Ambulatory Visit: Admitting: Orthopedic Surgery

## 2024-10-15 ENCOUNTER — Encounter (HOSPITAL_COMMUNITY): Payer: Self-pay | Admitting: Certified Registered"

## 2024-10-15 ENCOUNTER — Encounter (HOSPITAL_COMMUNITY)
Admission: EM | Disposition: A | Discharge status: Skilled Nursing Facility | Payer: Self-pay | Source: Home / Self Care | Attending: Internal Medicine

## 2024-10-15 DIAGNOSIS — D696 Thrombocytopenia, unspecified: Secondary | ICD-10-CM

## 2024-10-15 DIAGNOSIS — D649 Anemia, unspecified: Secondary | ICD-10-CM | POA: Diagnosis not present

## 2024-10-15 DIAGNOSIS — K922 Gastrointestinal hemorrhage, unspecified: Secondary | ICD-10-CM

## 2024-10-15 DIAGNOSIS — F10139 Alcohol abuse with withdrawal, unspecified: Secondary | ICD-10-CM

## 2024-10-15 DIAGNOSIS — R933 Abnormal findings on diagnostic imaging of other parts of digestive tract: Secondary | ICD-10-CM | POA: Diagnosis not present

## 2024-10-15 DIAGNOSIS — D689 Coagulation defect, unspecified: Secondary | ICD-10-CM

## 2024-10-15 DIAGNOSIS — K766 Portal hypertension: Secondary | ICD-10-CM | POA: Diagnosis not present

## 2024-10-15 DIAGNOSIS — K7030 Alcoholic cirrhosis of liver without ascites: Secondary | ICD-10-CM

## 2024-10-15 DIAGNOSIS — K920 Hematemesis: Secondary | ICD-10-CM | POA: Diagnosis not present

## 2024-10-15 DIAGNOSIS — K2100 Gastro-esophageal reflux disease with esophagitis, without bleeding: Secondary | ICD-10-CM | POA: Diagnosis not present

## 2024-10-15 DIAGNOSIS — K2210 Ulcer of esophagus without bleeding: Secondary | ICD-10-CM | POA: Diagnosis not present

## 2024-10-15 LAB — BASIC METABOLIC PANEL WITH GFR
Anion gap: 9 (ref 5–15)
BUN: 5 mg/dL — ABNORMAL LOW (ref 8–23)
CO2: 30 mmol/L (ref 22–32)
Calcium: 8.5 mg/dL — ABNORMAL LOW (ref 8.9–10.3)
Chloride: 108 mmol/L (ref 98–111)
Creatinine, Ser: 0.65 mg/dL (ref 0.44–1.00)
GFR, Estimated: >60 mL/min
Glucose, Bld: 149 mg/dL — ABNORMAL HIGH (ref 70–99)
Potassium: 3.2 mmol/L — ABNORMAL LOW (ref 3.5–5.1)
Sodium: 147 mmol/L — ABNORMAL HIGH (ref 135–145)

## 2024-10-15 LAB — CBC
HCT: 27.3 % — ABNORMAL LOW (ref 36.0–46.0)
Hemoglobin: 9 g/dL — ABNORMAL LOW (ref 12.0–15.0)
MCH: 30.3 pg (ref 26.0–34.0)
MCHC: 33 g/dL (ref 30.0–36.0)
MCV: 91.9 fL (ref 80.0–100.0)
Platelets: 62 K/uL — ABNORMAL LOW (ref 150–400)
RBC: 2.97 MIL/uL — ABNORMAL LOW (ref 3.87–5.11)
RDW: 16.3 % — ABNORMAL HIGH (ref 11.5–15.5)
WBC: 8.2 K/uL (ref 4.0–10.5)
nRBC: 0.4 % — ABNORMAL HIGH (ref 0.0–0.2)

## 2024-10-15 SURGERY — EGD (ESOPHAGOGASTRODUODENOSCOPY)
Anesthesia: Monitor Anesthesia Care

## 2024-10-15 MED ORDER — POTASSIUM PHOSPHATES 15 MMOLE/5ML IV SOLN
15.0000 mmol | Freq: Once | INTRAVENOUS | Status: AC
  Administered 2024-10-15: 15 mmol via INTRAVENOUS
  Filled 2024-10-15: qty 5

## 2024-10-15 MED ORDER — SODIUM CHLORIDE 0.9 % IV SOLN
2.0000 g | INTRAVENOUS | Status: DC
  Administered 2024-10-15 – 2024-10-16 (×2): 2 g via INTRAVENOUS
  Filled 2024-10-15 (×2): qty 20

## 2024-10-15 MED ORDER — POTASSIUM CHLORIDE 10 MEQ/100ML IV SOLN
10.0000 meq | INTRAVENOUS | Status: AC
  Administered 2024-10-15 (×3): 10 meq via INTRAVENOUS
  Filled 2024-10-15 (×3): qty 100

## 2024-10-15 NOTE — TOC Progression Note (Addendum)
 Transition of Care [facility]) - Progression Note    Patient Details  Name: [name] MRN: [id number] Date of Birth: [birth year]  [facility]) CM/SW Contact  Paloma Grange LITTIE Moose, [place] Phone Number: 10/15/2024, 10:00 AM  Clinical Narrative:    Pt currently Ox- CSW contacted pt sister/POA, [name], to provide her with SNF bed offers. [name] asked if CSW could text her the list, CSW sent [facility] the list of SNF offers via text. CSW will follow up on facility choice.      Barriers to Discharge: Continued Medical Work up               Expected Discharge Plan and Services     Post Acute Care Choice: Skilled Nursing Facility Living arrangements for the past 2 months: Single Family Home                                       Social Drivers of Health (SDOH) Interventions SDOH Screenings   Food Insecurity: No Food Insecurity (10/12/2024)  Housing: High Risk (10/12/2024)  Transportation Needs: No Transportation Needs (10/12/2024)  Utilities: Not At Risk (10/12/2024)  Social Connections: Moderately Isolated (10/12/2024)  Tobacco Use: Medium Risk (10/11/2024)    Readmission Risk Interventions    10/12/2024    8:40 AM  Readmission Risk Prevention Plan  Transportation Screening Complete  PCP or Specialist Appt within 5-7 Days Complete  Home Care Screening Complete  Medication Review (RN CM) Complete

## 2024-10-15 NOTE — Progress Notes (Signed)
 " PROGRESS NOTE [name]  FMW:[id number] DOB: 02/19/1956 DOA: 10/11/2024 PCP: Carlette Benita Area, MD  Brief Narrative/Hospital Course: [name] is a 68 y.o. female with PMH of IBS, asthma, stroke, seizure disorder, peptic ulcer disease, alcohol abuse, last seen by family drinking and dependence of alcohol in the evening of 12/24 and was found by family at home lying on the floor with dark emesis, seen at [facility] 12/25 12/25 In the ZI:jqzampoz,pwpupjoob hypotensive  62/44. S/p fluid resuscitated with 2.5 L of fluids.  He was started on ceftriaxone  and metronidazole .  Her blood pressure improved.  WBC 6.8, hemoglobin 1.7, platelets 216.  AST 94, ALT 35, alk phosphatase 123, total bilirubin 0.9.  Sodium 134, potassium 2.4, bicarbonate 26, serum creatinine 1.33.  Lipase 44. CT abdomen and pelvis>>negative for any acute findings. Chest x-ray>>negative for any acute findings. CT of the brain>>negative for any acute findings but showed previous aneurysmal clip. EKG showed sinus tachycardia.  Troponin 38.  Lactic acid >9 Despite aggressive fluid [name] patient was hypotensive and needed pressors  GI was consulted started on octreotide  infusion and pantoprazole  bid and empiric vancomycin  and cefepime . 12/26>transferred to [facility], ICU.  Placed on phenobarbital  taper for alcohol withdrawal remains on octreotide , treated for shock S/P 1 unit transfusion, lactic acidosis improved. 12/28> transferred to [facility] service  Subjective: Seen and examined Somewhat somnolent this morning but able to wake up tell me her name follows some commands-not consistent Overnight tachycardic 90s to 120s, BP stable on room air Labs reviewed mild hypokalemia 3.2 hyponatremia 147 CBC with stable hemoglobin at 9  Assessment and plan:   Severe alcohol abuse Alcohol withdrawal: Continue on phenobarbital  taper as ordered by PCCM team, folate thiamine  and CIWA Ativan  TOC consult for cessation  resources.  Patient placed on lactulose  given tremors as well. Phenobarbital  being discontinued today, CIWA score has been low  Acute metabolic encephalopathy: Multifactorial in the setting of alcohol withdrawal acute illness.  DC cefepime  and changed to ceftriaxone , minimize narcotics, weaning of phenobarbital  today.  Continue supportive care OOB PT OT and delirium precaution.  Dark Emesis Concern for upper GI bleeding ABLA on chronic anemia Alcoholic liver cirrhosis: Remains on octreotide  drip, PPI bid and empiric cefepime > changed to ceftriaxone  12/29.GI following waiting for EGD EGD in 2023 no evidence stigmata of portal hypertension, but varices or portal hypertensive gastropathy could have evolved in the interim. Hb as low as 6.7 this admit s/p 1 u prbc-baseline 10-11 ~ 2 month ago  Hemoglobin remains stable although tachycardic could be from alcohol withdrawal.  Cont gentle ivf Recent Labs    10/11/24 1527 10/11/24 1837 10/13/24 1445 10/13/24 2250 10/14/24 0519 10/14/24 0824 10/15/24 0503  HGB  --    < > 8.9* 9.2* 9.3* 10.2* 9.0*  MCV  --    < >  --   --  91.2  --  91.9  VITAMINB12 1,612*  --   --   --   --   --   --   FOLATE 6.7  --   --   --   --   --   --    < > = values in this interval not displayed.   Thrombocytopenia Leukopenia: WBC and platelet count drifting suspect in the setting of acute illness liver cirrhosis.  Monitor and transfuse as needed Recent Labs  Lab 10/13/24 0315 10/13/24 0851 10/14/24 0519 10/14/24 0824 10/15/24 0503  HGB 8.5*   < > 9.3* 10.2* 9.0*  HCT  24.6*   < > 28.1* 30.7* 27.3*  WBC 4.7  --  3.7*  --  8.2  PLT 62*  --  61*  --  62*   < > = values in this interval not displayed.     Undifferentiated shock Hypovolemic shock Possible hemorrhagic shock: Vital signs stabilized post fluid resuscitation and pressors and blood transfusion.Monitor  Hypokalemia Hypophosphatemia: Replaced Recent Labs  Lab 10/11/24 0805 10/11/24 1837  10/12/24 0023 10/12/24 1804 10/14/24 0519 10/14/24 2044 10/15/24 0503  K 2.4* 3.9 3.9 3.5 3.2*  --  3.2*  CALCIUM 9.3  --  8.1* 8.1* 8.3*  --  8.5*  MG  --  1.2* 3.5* 2.0  --   --   --   PHOS  --   --  2.1* 1.4*  --  1.7*  --     Lactic acidosis up to >9 In the setting of alcohol abuse hypovolemia.  Level 5.6 on 12/26 In ICU   Seizure disorder Cont dilantin   Moderate malnutrition Hypoalbuminemia: Augment diet as tolerated   Deconditioning/debility/weakness Continue PT OT recommending SNF as below. PT Orders: Active PT Follow up Rec: Skilled Nursing-Short Term Rehab (<3 Hours/Day) (Pending Progress)10/13/2024 1600   DVT prophylaxis: SCDs Start: 10/11/24 1354 Code Status:   Code Status: Full Code Family Communication: plan of care discussed with patient at bedside. Called her sister no answer. Patient status is: Remains hospitalized because of severity of illness Level of care: Med-Surg   Dispo: The patient is from: home            Anticipated disposition: TBD  Objective: Vitals last 24 hrs: Vitals:   10/14/24 2038 10/15/24 0000 10/15/24 0343 10/15/24 1000  BP: 128/84 (!) 141/107 129/62 120/64  Pulse: (!) 126 (!) 118 (!) 113 (!) 109  Resp:      Temp: 98.9 F (37.2 C) 98.2 F (36.8 C) 99.9 F (37.7 C) 99 F (37.2 C)  TempSrc: Oral Oral Oral Oral  SpO2: 93% 100% 99% 98%  Weight:      Height:        Physical Examination: General exam: Sleepy but able to tell me her name, oriented x 1-2  HEENT:Oral mucosa moist, Ear/Nose WNL grossly Respiratory system: CTA Bilaterally  Cardiovascular system: S1 & S2 +, No JVD. Gastrointestinal system: Abdomen soft,NT,ND, BS+ Nervous System: Alert, awake, RUE contractures weak rt side Extremities: extremities warm, leg edema neg Skin: Warm, no rashes MSK: Normal muscle bulk,tone, power   Medications reviewed:  Scheduled Meds:  Chlorhexidine  Gluconate Cloth  6 each Topical Q0600   feeding supplement  1 Container Oral TID BM    folic acid   1 mg Oral Daily   lactulose   20 g Oral TID   multivitamin with minerals  1 tablet Oral Daily   pantoprazole  (PROTONIX ) IV  40 mg Intravenous Q12H   phenobarbital   32.4 mg Oral BID   phenytoin   100 mg Oral TID   sodium chloride  flush  10-40 mL Intracatheter Q12H   thiamine   100 mg Oral Daily   Continuous Infusions:  sodium chloride  20 mL/hr at 10/14/24 1749   cefTRIAXone  (ROCEPHIN )  IV     lactated ringers  1,000 mL with potassium chloride  20 mEq infusion 50 mL/hr at 10/15/24 0346   octreotide  (SANDOSTATIN ) 500 mcg in sodium chloride  0.9 % 250 mL (2 mcg/mL) infusion 50 mcg/hr (10/15/24 0948)   potassium chloride  10 mEq (10/15/24 0939)   potassium PHOSPHATE  IVPB (in mmol)     Diet: Diet Order  Diet NPO time specified Except for: Sips with Meds  Diet effective midnight           Diet full liquid Room service appropriate? Yes; Fluid consistency: Thin  Diet effective now                  Data Reviewed: I have personally reviewed following labs and imaging studies ( see epic result tab) CBC: Recent Labs  Lab 10/11/24 0807 10/11/24 1837 10/12/24 0023 10/12/24 1804 10/12/24 2020 10/13/24 0315 10/13/24 0851 10/13/24 1445 10/13/24 2250 10/14/24 0519 10/14/24 0824 10/15/24 0503  WBC 6.8   < > 13.7* 4.9  --  4.7  --   --   --  3.7*  --  8.2  NEUTROABS 5.7  --   --   --   --   --   --   --   --  2.2  --   --   HGB 11.7*   < > 8.2* 6.7*   < > 8.5*   < > 8.9* 9.2* 9.3* 10.2* 9.0*  HCT 33.8*   < > 23.5* 19.9*   < > 24.6*   < > 26.5* 28.5* 28.1* 30.7* 27.3*  MCV 86.0   < > 88.3 89.2  --  88.5  --   --   --  91.2  --  91.9  PLT 216   < > 120* 68*  --  62*  --   --   --  61*  --  62*   < > = values in this interval not displayed.   CMP: Recent Labs  Lab 10/11/24 0805 10/11/24 1837 10/12/24 0023 10/12/24 1804 10/14/24 0519 10/14/24 2044 10/15/24 0503  NA 134*  --  136 142 148*  --  147*  K 2.4* 3.9 3.9 3.5 3.2*  --  3.2*  CL 80*  --  96* 107 109   --  108  CO2 26  --  28 30 32  --  30  GLUCOSE 157*  --  132* 124* 137*  --  149*  BUN 13  --  17 13 7*  --  5*  CREATININE 1.33*  --  0.89 0.75 0.62  --  0.65  CALCIUM 9.3  --  8.1* 8.1* 8.3*  --  8.5*  MG  --  1.2* 3.5* 2.0  --   --   --   PHOS  --   --  2.1* 1.4*  --  1.7*  --    GFR: Estimated Creatinine Clearance: 58.9 mL/min (by C-G formula based on SCr of 0.65 mg/dL). Recent Labs  Lab 10/11/24 0805 10/12/24 0023 10/12/24 1804 10/14/24 0519  AST 94* 68* 47* 46*  ALT 35 25 18 18   ALKPHOS 123 82 74 81  BILITOT 0.9 1.0 0.8 1.0  PROT 7.7 5.4* 4.8* 5.2*  ALBUMIN 4.0 2.9* 2.6* 2.7*    Recent Labs  Lab 10/11/24 0805  LIPASE 44    Recent Labs  Lab 10/12/24 1700  AMMONIA 21   Coagulation Profile:  Recent Labs  Lab 10/11/24 0909  INR 1.2   Unresulted Labs (From admission, onward)     Start     Ordered   10/15/24 0500  Basic metabolic panel with GFR  Daily,   R     Question:  Specimen collection method  Answer:  Unit=Unit collect   10/14/24 0834   10/15/24 0500  CBC  Daily,   R     Question:  Specimen  collection method  Answer:  Unit=Unit collect   10/14/24 0834   10/11/24 1435  MRSA Next Gen by PCR, Nasal  Once,   R       Question Answer Comment  Patient immune status Normal   Release to patient Immediate      10/11/24 1434           Antimicrobials/Microbiology: Anti-infectives (From admission, onward)    Start     Dose/Rate Route Frequency Ordered Stop   10/15/24 1100  cefTRIAXone  (ROCEPHIN ) 2 g in sodium chloride  0.9 % 100 mL IVPB        2 g 200 mL/hr over 30 Minutes Intravenous Every 24 hours 10/15/24 1008     10/12/24 1500  vancomycin  (VANCOREADY) IVPB 750 mg/150 mL  Status:  Discontinued        750 mg 150 mL/hr over 60 Minutes Intravenous Every 24 hours 10/11/24 1422 10/12/24 1143   10/12/24 1400  vancomycin  (VANCOCIN ) IVPB 1000 mg/200 mL premix  Status:  Discontinued        1,000 mg 200 mL/hr over 60 Minutes Intravenous Every 24 hours 10/12/24  1143 10/12/24 1505   10/11/24 1600  ceFEPIme  (MAXIPIME ) 2 g in sodium chloride  0.9 % 100 mL IVPB  Status:  Discontinued        2 g 200 mL/hr over 30 Minutes Intravenous Every 12 hours 10/11/24 1422 10/15/24 1008   10/11/24 1515  vancomycin  (VANCOCIN ) IVPB 1000 mg/200 mL premix        1,000 mg 200 mL/hr over 60 Minutes Intravenous  Once 10/11/24 1422 10/11/24 1546   10/11/24 1100  metroNIDAZOLE  (FLAGYL ) IVPB 500 mg        500 mg 100 mL/hr over 60 Minutes Intravenous  Once 10/11/24 1054 10/11/24 1321   10/11/24 0745  cefTRIAXone  (ROCEPHIN ) 2 g in sodium chloride  0.9 % 100 mL IVPB        2 g 200 mL/hr over 30 Minutes Intravenous  Once 10/11/24 0734 10/11/24 0951         Component Value Date/Time   SDES  10/11/2024 0909    BLOOD LEFT HAND BOTTLES DRAWN AEROBIC AND ANAEROBIC   SPECREQUEST Blood Culture adequate volume 10/11/2024 0909   CULT  10/11/2024 0909    NO GROWTH 4 DAYS Performed at [facility], [address] [place], [place] [address]    REPTSTATUS PENDING 10/11/2024 9090    Procedures: **Canceled** Procedures (LRB): EGD (ESOPHAGOGASTRODUODENOSCOPY) (N/A)   Mennie LAMY, MD [facility] 10/15/2024, 1:04 PM   "

## 2024-10-15 NOTE — Care Management Important Message (Signed)
 Important Message  Patient Details  Name: [name] MRN: [id number] Date of Birth: [birth year]   Important Message Given:  Yes - Medicare IM     Jennie Laneta Dragon 10/15/2024, 2:07 PM

## 2024-10-15 NOTE — Progress Notes (Addendum)
 Patient ID: [name], female   DOB: October 12, 1956, 68 y.o.   MRN: [id number]    Progress Note   Subjective   Day # 4 CC; cirrhosis, EtOH withdrawal, coffee-ground emesis  Maxipime  IV PPI twice daily Octreotide   Labs today-WBC 8.2/hemoglobin 9.0/hematocrit 27.3/platelets 62 /Sodium 147/potassium 3.2/BUN 5/creatinine 0.65  Tmax 99.9 Tachycardic 115/blood pressure 129/62  Patient sleeping currently, nurse reported she has not had Ativan  since last evening.  Reports from overnight staff as that she has had continued confusion.  Difficult to arouse at present.  Vital signs have been stable No reports of any further coffee-ground emesis or nausea vomiting   Objective   Vital signs in last 24 hours: Temp:  [98.2 F (36.8 C)-99.9 F (37.7 C)] 99.9 F (37.7 C) (12/29 0343) Pulse Rate:  [93-126] 113 (12/29 0343) Resp:  [15-16] 15 (12/28 1843) BP: (128-156)/(54-107) 129/62 (12/29 0343) SpO2:  [93 %-100 %] 99 % (12/29 0343) Last BM Date : 10/13/24 General:    Older African-American female, somnolent Heart:  Regular rate and rhythm; no murmurs Lungs: Respirations even and unlabored, lungs CTA bilaterally Abdomen:  Soft, nontender and nondistended. Normal bowel sounds. Extremities:  Without edema. Neurologic: Somnolent, arouses briefly to stimulation Psych:  Cooperative.  Intake/Output from previous day: 12/28 0701 - 12/29 0700 In: 2012.3 [P.O.:340; I.V.:1248.7; IV Piggyback:423.6] Out: 1200 [Urine:1200] Intake/Output this shift: No intake/output data recorded.  Lab Results: Recent Labs    10/13/24 0315 10/13/24 0851 10/14/24 0519 10/14/24 0824 10/15/24 0503  WBC 4.7  --  3.7*  --  8.2  HGB 8.5*   < > 9.3* 10.2* 9.0*  HCT 24.6*   < > 28.1* 30.7* 27.3*  PLT 62*  --  61*  --  62*   < > = values in this interval not displayed.   BMET Recent Labs    10/12/24 1804 10/14/24 0519 10/15/24 0503  NA 142 148* 147*  K 3.5 3.2* 3.2*  CL 107 109 108  CO2 30 32 30   GLUCOSE 124* 137* 149*  BUN 13 7* 5*  CREATININE 0.75 0.62 0.65  CALCIUM 8.1* 8.3* 8.5*   LFT Recent Labs    10/14/24 0519  PROT 5.2*  ALBUMIN 2.7*  AST 46*  ALT 18  ALKPHOS 81  BILITOT 1.0   PT/INR No results for input(s): LABPROT, INR in the last 72 hours.       Assessment / Plan:    #47 68 year old African-American female is transferred from [facility] with acute GI bleed, coffee-ground emesis with anemia had initially been hypotensive and requiring pressors.  Has since developed EtOH withdrawal  MEL D NA=8  Off pressors since 10/13/2024  is on IV PPI twice daily Octreotide  to be discontinued today Continues on Maxipime  Lactulose  started yesterday  Remains confused, and currently somnolent.  No Ativan  since last evening  No active bleeding, hemoglobin currently stable at 9  Etiology of GI bleeding is not clear, rule out esophagitis, portal gastropathy, doubt varices with fairly well compensated cirrhosis but cannot rule out  #2 remote CVA #3 seizure disorder #4 circumferential distal esophageal wall thickening on CT suggestive of esophagitis   Plan; full liquid diet today, n.p.o. past midnight Holding off on EGD/sedation today as still has been quite confused  Lactulose  30 cc twice daily Will continue octreotide  today Continue to trend hemoglobin serially and transfuse as indicated to keep hemoglobin 7 Will reassess early a.m. tomorrow, hopefully if confusion improved can proceed with EGD tomorrow  Principal Problem:   Upper GI bleed Active Problems:   Alcohol abuse   Hypokalemia   Hematemesis   ABLA (acute blood loss anemia)   Lactic acidosis   Transaminitis   SIRS (systemic inflammatory response syndrome) (HCC)     LOS: 4 days   Amy EsterwoodPA-C  10/15/2024, 8:30 AM  I have taken an interval history, thoroughly reviewed the chart and examined the patient. I agree with the Advanced Practitioner's note, impression and  recommendations, and have recorded additional findings, impressions and recommendations below. I performed a substantive portion of this encounter (>50% time spent), including a complete performance of the medical decision making.  I also personally reviewed the images from the admission CT abdomen and pelvis  My additional thoughts are as follows:  No overt GI bleeding at present, hemoglobin fluctuating with volume status. Patient remains somnolent and confused-she is not oriented to month year or location at the time of my exam.  Suspect alcohol withdrawal with level of 180 on admission and ammonia of 21  Alcohol-related cirrhosis with portal hypertension and thrombocytopenia, no ascites, mild coagulopathy (INR 1.2)             alcohol use disorder  Agree that this is not likely variceal bleeding.  Probably related to reflux and alcohol related esophagitis based on overall clinical picture and imaging findings.  Abnormal imaging findings on CTAP suggesting esophagitis.  She is not yet ready for upper endoscopy until her mental status improves.  However, she can have clear liquid diet today (which I ordered), and then n.p.o. after midnight in case she is ready for EGD tomorrow.  Complete 5 days antibiotics for upper GI bleeding inpatient with cirrhosis.  _________________  This consultation required a high degree of medical decision making due to the nature and complexity of the acute condition(s) being evaluated as well as the patient's medical comorbidities.  Victory LITTIE Brand III Office:[phone]

## 2024-10-16 ENCOUNTER — Encounter (HOSPITAL_COMMUNITY): Payer: Self-pay | Admitting: Internal Medicine

## 2024-10-16 ENCOUNTER — Encounter (HOSPITAL_COMMUNITY)
Admission: EM | Disposition: A | Discharge status: Skilled Nursing Facility | Payer: Self-pay | Source: Home / Self Care | Attending: Internal Medicine

## 2024-10-16 ENCOUNTER — Inpatient Hospital Stay (HOSPITAL_COMMUNITY)

## 2024-10-16 DIAGNOSIS — K2100 Gastro-esophageal reflux disease with esophagitis, without bleeding: Secondary | ICD-10-CM | POA: Diagnosis not present

## 2024-10-16 DIAGNOSIS — K2210 Ulcer of esophagus without bleeding: Secondary | ICD-10-CM | POA: Diagnosis not present

## 2024-10-16 DIAGNOSIS — I10 Essential (primary) hypertension: Secondary | ICD-10-CM

## 2024-10-16 DIAGNOSIS — K922 Gastrointestinal hemorrhage, unspecified: Secondary | ICD-10-CM | POA: Diagnosis not present

## 2024-10-16 DIAGNOSIS — Z87891 Personal history of nicotine dependence: Secondary | ICD-10-CM

## 2024-10-16 DIAGNOSIS — D62 Acute posthemorrhagic anemia: Secondary | ICD-10-CM

## 2024-10-16 DIAGNOSIS — J45909 Unspecified asthma, uncomplicated: Secondary | ICD-10-CM

## 2024-10-16 HISTORY — PX: ESOPHAGOGASTRODUODENOSCOPY: SHX5428

## 2024-10-16 LAB — BASIC METABOLIC PANEL WITH GFR
Anion gap: 8 (ref 5–15)
BUN: 6 mg/dL — ABNORMAL LOW (ref 8–23)
CO2: 29 mmol/L (ref 22–32)
Calcium: 7.7 mg/dL — ABNORMAL LOW (ref 8.9–10.3)
Chloride: 117 mmol/L — ABNORMAL HIGH (ref 98–111)
Creatinine, Ser: 0.62 mg/dL (ref 0.44–1.00)
GFR, Estimated: >60 mL/min
Glucose, Bld: 128 mg/dL — ABNORMAL HIGH (ref 70–99)
Potassium: 3.1 mmol/L — ABNORMAL LOW (ref 3.5–5.1)
Sodium: 154 mmol/L — ABNORMAL HIGH (ref 135–145)

## 2024-10-16 LAB — CULTURE, BLOOD (ROUTINE X 2)
Culture: NO GROWTH
Culture: NO GROWTH
Special Requests: ADEQUATE

## 2024-10-16 LAB — CBC
HCT: 25.5 % — ABNORMAL LOW (ref 36.0–46.0)
Hemoglobin: 8.2 g/dL — ABNORMAL LOW (ref 12.0–15.0)
MCH: 30.1 pg (ref 26.0–34.0)
MCHC: 32.2 g/dL (ref 30.0–36.0)
MCV: 93.8 fL (ref 80.0–100.0)
Platelets: 65 K/uL — ABNORMAL LOW (ref 150–400)
RBC: 2.72 MIL/uL — ABNORMAL LOW (ref 3.87–5.11)
RDW: 16.8 % — ABNORMAL HIGH (ref 11.5–15.5)
WBC: 6.4 K/uL (ref 4.0–10.5)
nRBC: 0.3 % — ABNORMAL HIGH (ref 0.0–0.2)

## 2024-10-16 MED ORDER — DEXTROSE 5 % IV SOLN: INTRAVENOUS | Status: AC

## 2024-10-16 MED ORDER — PHENYLEPHRINE 80 MCG/ML (10ML) SYRINGE FOR IV PUSH (FOR BLOOD PRESSURE SUPPORT)
PREFILLED_SYRINGE | INTRAVENOUS | Status: DC | PRN
  Administered 2024-10-16: 240 ug via INTRAVENOUS

## 2024-10-16 MED ORDER — POTASSIUM CHLORIDE 10 MEQ/50ML IV SOLN
10.0000 meq | Freq: Once | INTRAVENOUS | Status: DC
  Filled 2024-10-16: qty 50

## 2024-10-16 MED ORDER — ZINC OXIDE 40 % EX OINT
TOPICAL_OINTMENT | CUTANEOUS | Status: DC | PRN
  Filled 2024-10-16: qty 57

## 2024-10-16 MED ORDER — SODIUM CHLORIDE 0.9 % IV SOLN: INTRAVENOUS | Status: DC | PRN

## 2024-10-16 MED ORDER — ACETAMINOPHEN 650 MG RE SUPP: 650.0000 mg | Freq: Four times a day (QID) | RECTAL | Status: AC | PRN

## 2024-10-16 MED ORDER — ACETAMINOPHEN 325 MG PO TABS
650.0000 mg | ORAL_TABLET | Freq: Four times a day (QID) | ORAL | Status: AC | PRN
  Administered 2024-10-16 – 2024-10-19 (×2): 650 mg via ORAL
  Filled 2024-10-16 (×2): qty 2

## 2024-10-16 MED ORDER — PANTOPRAZOLE SODIUM 40 MG PO TBEC
40.0000 mg | DELAYED_RELEASE_TABLET | Freq: Two times a day (BID) | ORAL | Status: DC
  Administered 2024-10-17 – 2024-10-19 (×5): 40 mg via ORAL
  Filled 2024-10-16 (×5): qty 1

## 2024-10-16 MED ORDER — POTASSIUM CHLORIDE CRYS ER 20 MEQ PO TBCR
40.0000 meq | EXTENDED_RELEASE_TABLET | Freq: Once | ORAL | Status: AC
  Administered 2024-10-16: 40 meq via ORAL
  Filled 2024-10-16: qty 2

## 2024-10-16 MED ORDER — PROPOFOL 10 MG/ML IV BOLUS
INTRAVENOUS | Status: DC | PRN
  Administered 2024-10-16: 30 mg via INTRAVENOUS
  Administered 2024-10-16 (×4): 20 mg via INTRAVENOUS
  Administered 2024-10-16: 10 mg via INTRAVENOUS

## 2024-10-16 MED ORDER — POTASSIUM CHLORIDE 10 MEQ/100ML IV SOLN
INTRAVENOUS | Status: AC
  Filled 2024-10-16: qty 100

## 2024-10-16 MED ORDER — POTASSIUM CHLORIDE 10 MEQ/100ML IV SOLN
10.0000 meq | INTRAVENOUS | Status: AC
  Administered 2024-10-16 (×3): 10 meq via INTRAVENOUS
  Filled 2024-10-16 (×2): qty 100

## 2024-10-16 MED ORDER — PROPOFOL 500 MG/50ML IV EMUL
INTRAVENOUS | Status: DC | PRN
  Administered 2024-10-16: 100 ug/kg/min via INTRAVENOUS

## 2024-10-16 NOTE — Op Note (Signed)
 [facility] Patient Name: [name] Procedure Date : 10/16/2024 MRN: [id number] Attending MD: Victory CROME. Legrand , MD, [id number] Date of Birth: October 14, 1956 CSN: [id number] Age: 68 Admit Type: Inpatient Procedure:                Upper GI endoscopy Indications:              Acute post hemorrhagic anemia, Coffee-ground                            emesis, Hematemesis, CT scan suggesting                            esophagitis, EtOH abuse Providers:                Victory L. Legrand, MD, Hoy jenkins Penner, RN, Felice Sar, Technician Referring MD:             [facility] Medicines:                Monitored Anesthesia Care Complications:            No immediate complications. Estimated Blood Loss:     Estimated blood loss: none. Procedure:                Pre-Anesthesia Assessment:                           - Prior to the procedure, a History and Physical                            was performed, and patient medications and                            allergies were reviewed. The patient's tolerance of                            previous anesthesia was also reviewed. The risks                            and benefits of the procedure and the sedation                            options and risks were discussed with the patient.                            All questions were answered, and informed consent                            was obtained. Prior Anticoagulants: The patient has                            taken no anticoagulant or antiplatelet agents. ASA  Grade Assessment: III - A patient with severe                            systemic disease. After reviewing the risks and                            benefits, the patient was deemed in satisfactory                            condition to undergo the procedure.                           After obtaining informed consent, the endoscope was                            passed  under direct vision. Throughout the                            procedure, the patient's blood pressure, pulse, and                            oxygen  saturations were monitored continuously. The                            [id number]-H190 ([id number]) Olympus endoscope was introduced                            through the mouth, and advanced to the second part                            of duodenum. The upper GI endoscopy was                            accomplished without difficulty. The patient                            tolerated the procedure fairly well. Scope In: Scope Out: Findings:      A small hiatal hernia was present.      LA Grade C (one or more mucosal breaks continuous between tops of 2 or       more mucosal folds, less than 75% circumference) esophagitis with no       bleeding was found in the middle and lower thirds of the esophagus.       Multiple shallow ulcers , primarily at [facility] (with a few more proximal as       well). No active bleeding or stigmata of bleeding.      The exam of the esophagus was otherwise normal. Specifically, no varices       seen.      The stomach was normal.      The cardia and gastric fundus were normal on retroflexion. Specifically,       no varices seen.      The examined duodenum was normal. Impression:               - Small hiatal hernia.                           -  LA Grade C reflux esophagitis with no bleeding.                            Ulcerations from this process (source of recent                            self-limited bleeding)                           - Normal stomach.                           - Normal examined duodenum.                           - No specimens collected. Recommendation:           - Return patient to hospital ward for ongoing care.                           - Low sodium diet.                           - Keep head of bed elevated to at least 30 degrees                            to decrease GERD                            Pantoprazole  40 mg twice daily x 4 weeks                           Discontinue octreotide  (done)                           No further inpatient GI testing planned.                           After mental status normalizes and patient ready                            for discharge per medical team, follow up with                            primary GI team in [place].                           Inpatient GI team signing off - call as needed. Procedure Code(s):        --- Professional ---                           [phone], Esophagogastroduodenoscopy, flexible,                            transoral; diagnostic, including collection of  specimen(s) by brushing or washing, when performed                            (separate procedure) Diagnosis Code(s):        --- Professional ---                           K44.9, Diaphragmatic hernia without obstruction or                            gangrene                           K21.00, Gastro-esophageal reflux disease with                            esophagitis, without bleeding                           D62, Acute posthemorrhagic anemia                           K92.0, Hematemesis CPT copyright 2022 American Medical Association. All rights reserved. The codes documented in this report are preliminary and upon coder review may  be revised to meet current compliance requirements. Rachel Samples L. Legrand, MD 10/16/2024 12:17:27 PM This report has been signed electronically. Number of Addenda: 0

## 2024-10-16 NOTE — TOC Progression Note (Addendum)
 Transition of Care [facility]) - Progression Note    Patient Details  Name: [name] MRN: [id number] Date of Birth: 08-12-1956  [facility] CM/SW Contact  Jamil Armwood LITTIE Moose, [place] Phone Number: 10/16/2024, 1:20 PM  Clinical Narrative:    CSW spoke with pt siser/POA, Zettie, regarding SNF facility choice. Zettie stated her first choice is [facility]. Per MD, pt ready for DC in 1-2 days. CSW initiated insurance auth process for [facility], ref# [id number]. CSW will continue to follow.    Barriers to Discharge: Continued Medical Work up               Expected Discharge Plan and Services     Post Acute Care Choice: Skilled Nursing Facility Living arrangements for the past 2 months: Single Family Home                                       Social Drivers of Health (SDOH) Interventions SDOH Screenings   Food Insecurity: No Food Insecurity (10/12/2024)  Housing: High Risk (10/12/2024)  Transportation Needs: No Transportation Needs (10/12/2024)  Utilities: Not At Risk (10/12/2024)  Social Connections: Moderately Isolated (10/12/2024)  Tobacco Use: Medium Risk (10/16/2024)    Readmission Risk Interventions    10/12/2024    8:40 AM  Readmission Risk Prevention Plan  Transportation Screening Complete  PCP or Specialist Appt within 5-7 Days Complete  Home Care Screening Complete  Medication Review (RN CM) Complete

## 2024-10-16 NOTE — Progress Notes (Signed)
 Patient ID: [name], female   DOB: 21-Aug-1956, 68 y.o.   MRN: [id number]    Progress Note   Subjective  Day # 6 CC: Cirrhosis, EtOH withdrawal, coffee-ground emesis  IV PPI twice daily Octreotide  Maxipime   Tmax 99.3, blood pressure 134/67, pulse 100  Labs today-WBC 6.4/hemoglobin 8.2/hematocrit 25.5/platelets 65-) has drifted but no overt bleeding Sodium 154/potassium 3.1/BUN 6/creatinine 0.62  Patient had been very somnolent yesterday morning a bit more alert yesterday afternoon.  This morning she is alert, denies any abdominal pain, no nausea, asking for food says she is hungry.  She is still disoriented x 3 but cooperative and appropriate   Objective   Vital signs in last 24 hours: Temp:  [97.7 F (36.5 C)-99.3 F (37.4 C)] 97.7 F (36.5 C) (12/30 0525) Pulse Rate:  [108-115] 110 (12/30 0013) Resp:  [16-19] 17 (12/30 0525) BP: (120-146)/(64-82) 134/67 (12/30 0525) SpO2:  [98 %-100 %] 99 % (12/30 0006) Last BM Date : 10/15/24 General:    Older African-American female in NAD Heart:  Regular rate and rhythm; no murmurs Lungs: Respirations even and unlabored, lungs CTA bilaterally Abdomen:  Soft, nontender and nondistended. Normal bowel sounds. Extremities:  Without edema. Neurologic:  Alert and oriented x 1, cooperative, right upper extremity is contractured Psych:  Cooperative.   Intake/Output from previous day: 12/29 0701 - 12/30 0700 In: 778.1 [P.O.:120; I.V.:557.5; IV Piggyback:100.6] Out: 500 [Urine:500] Intake/Output this shift: No intake/output data recorded.  Lab Results: Recent Labs    10/14/24 0519 10/14/24 0824 10/15/24 0503 10/16/24 0346  WBC 3.7*  --  8.2 6.4  HGB 9.3* 10.2* 9.0* 8.2*  HCT 28.1* 30.7* 27.3* 25.5*  PLT 61*  --  62* 65*   BMET Recent Labs    10/14/24 0519 10/15/24 0503 10/16/24 0346  NA 148* 147* 154*  K 3.2* 3.2* 3.1*  CL 109 108 117*  CO2 32 30 29  GLUCOSE 137* 149* 128*  BUN 7* 5* 6*  CREATININE 0.62 0.65  0.62  CALCIUM 8.3* 8.5* 7.7*   LFT Recent Labs    10/14/24 0519  PROT 5.2*  ALBUMIN 2.7*  AST 46*  ALT 18  ALKPHOS 81  BILITOT 1.0   PT/INR No results for input(s): LABPROT, INR in the last 72 hours.       Assessment / Plan:    #43 68 year old African-American female who was transferred from [facility] with acute GI bleed, coffee-ground emesis, anemia, hypotension initially requiring pressors.  She stabilized, has been out of the ICU over the past 3 days, since developed EtOH withdrawal.  MELD-Na=8  Patient has not required any Ativan  over the past 24 hours, she is alert and communicative this morning cooperative but still disoriented  Hemoglobin has drifted to 8.2 but no active bleeding  Etiology of her GI bleeding is not clear, consider esophagitis, portal gastropathy, doubt variceal but possible.  2 previous CVA 3.  Seizure disorder 4.  Circumferential distal esophageal wall thickening on CT suggestive of esophagitis 5 anemia acute on chronic secondary to acute GI blood loss  Plan; keep n.p.o. this morning Patient will be scheduled for EGD with Dr. Legrand, I have spoken with the patient's sister who is power of attorney/[name] who will give verbal consent for the procedure which will be done later this morning.  I reviewed indications risks and benefits and she is agreeable to proceed.  Continue twice daily PPI Discontinue octreotide  based on today's findings Continue lactulose  twice daily Continue  to trend hemoglobin  and transfuse as indicated to keep her hemoglobin 7 To recommendations pending findings at EGD     Principal Problem:   Upper GI bleed Active Problems:   Alcohol abuse   Hypokalemia   Hematemesis   ABLA (acute blood loss anemia)   Lactic acidosis   Transaminitis   SIRS (systemic inflammatory response syndrome) (HCC)     LOS: 5 days   Anni Hocevar PA-C 10/16/2024, 9:14 AM

## 2024-10-16 NOTE — Progress Notes (Signed)
 " PROGRESS NOTE BRIDGIT EYNON  FMW:[id number] DOB: 02-Jan-1956 DOA: 10/11/2024 PCP: Carlette Benita Area, MD  Brief Narrative/Hospital Course: ZELTA ENFIELD is a 68 y.o. female with PMH of IBS, asthma, stroke, seizure disorder, peptic ulcer disease, alcohol abuse, last seen by family drinking and dependence of alcohol in the evening of 12/24 and was found by family at home lying on the floor with dark emesis, seen at [facility] 12/25 12/25 In the ZI:jqzampoz,pwpupjoob hypotensive  62/44. S/p fluid resuscitated with 2.5 L of fluids.  He was started on ceftriaxone  and metronidazole .  Her blood pressure improved.  WBC 6.8, hemoglobin 1.7, platelets 216.  AST 94, ALT 35, alk phosphatase 123, total bilirubin 0.9.  Sodium 134, potassium 2.4, bicarbonate 26, serum creatinine 1.33.  Lipase 44. CT abdomen and pelvis>>negative for any acute findings. Chest x-ray>>negative for any acute findings. CT of the brain>>negative for any acute findings but showed previous aneurysmal clip. EKG showed sinus tachycardia.  Troponin 38.  Lactic acid >9 Despite aggressive fluid [name] patient was hypotensive and needed pressors  GI was consulted started on octreotide  infusion and pantoprazole  bid and empiric vancomycin  and cefepime . 12/26>transferred to [facility], ICU.  Placed on phenobarbital  taper for alcohol withdrawal remains on octreotide , treated for shock S/P 1 unit transfusion, lactic acidosis improved. 12/28> transferred to [facility] service  Subjective: Seen and examined More alert awake this morning compared to yesterday morning Went for endoscopy this morning Overnight remains afebrile intermittently tachycardic on room air Labs shows hyponatremia hypokalemia hemoglobin down to 8.2 from 9 platelet about the same 65k Last BM 12/29  Assessment and plan:   Severe alcohol abuse Alcohol withdrawal: Continue on phenobarbital  taper as ordered by PCCM team, folate thiamine  and CIWA Ativan  TOC  consult for cessation resources.  Patient placed on lactulose  given tremors as well. Phenobarbital  being discontinued today, CIWA score has been low  Acute metabolic encephalopathy: Multifactorial in the setting of alcohol withdrawal acute illness.  Now off cefepime  and changed to ceftriaxone , minimize narcotics, completed phenobarbital  12/29.mental status much better but still slow ot respond Continue supportive care OOB PT OT and delirium precaution.  Dark Emesis Concern for upper GI bleeding ABLA on chronic anemia Alcoholic liver cirrhosis\ Esophagitis: Remains on octreotide  drip, PPI bid and empiric cefepime > changed to ceftriaxone  12/29-continue x 5 days in the setting of cirrhosis GI following for s/pEGD-LA Grade C reflux esophagitis with no bleeding, ucerations from this process (source of recent                     self-limited bleeding) - to cont pi bid x 4 wk dc octreotide , Diet ADAT b as low as 6.7 this admit s/p 1 u prbc-baseline 10-11 ~ 2 month ago.  Continue to monitor hemoglobin  Thrombocytopenia Leukopenia: WBC count improved platelet remained stable above 60K, monitor. Suspect in the setting of acute illness liver cirrhosis. Recent Labs  Lab 10/14/24 0519 10/14/24 0824 10/15/24 0503 10/16/24 0346  HGB 9.3* 10.2* 9.0* 8.2*  HCT 28.1* 30.7* 27.3* 25.5*  WBC 3.7*  --  8.2 6.4  PLT 61*  --  62* 65*     Undifferentiated shock Hypovolemic shock Possible hemorrhagic shock: Vital signs stabilized post fluid resuscitation and pressors and blood transfusion.Monitor  Hypernatremia Hypokalemia Hypophosphatemia: Replaced electrolytes, sodium trending up suspect free water  deficit, change to D5W.  Monitor BMP and electrolytes. Recent Labs  Lab 10/11/24 8162 10/12/24 0023 10/12/24 1804 10/14/24 9480 10/14/24 2044 10/15/24 0503 10/16/24 9653  K 3.9 3.9 3.5 3.2*  --  3.2* 3.1*  CALCIUM  --  8.1* 8.1* 8.3*  --  8.5* 7.7*  MG 1.2* 3.5* 2.0  --   --   --   --   PHOS  --   2.1* 1.4*  --  1.7*  --   --     Lactic acidosis up to >9 In the setting of alcohol abuse hypovolemia.  Level 5.6 on 12/26 In ICU   Seizure disorder Cont dilantin   Moderate malnutrition Hypoalbuminemia: Augment diet as tolerated   Deconditioning/debility/weakness Continue PT OT recommending SNF as below. PT Orders: Active PT Follow up Rec: Skilled Nursing-Short Term Rehab (<3 Hours/Day) (Pending Progress)10/13/2024 1600   DVT prophylaxis: SCDs Start: 10/11/24 1354 Code Status:   Code Status: Full Code Family Communication: plan of care discussed with patient at bedside. Called her sister no answer. Patient status is: Remains hospitalized because of severity of illness Level of care: Med-Surg   Dispo: The patient is from: home            Anticipated disposition:  SNF 1-2 days  Objective: Vitals last 24 hrs: Vitals:   10/16/24 1220 10/16/24 1230 10/16/24 1235 10/16/24 1251  BP: 113/64 136/63 127/68 123/66  Pulse: 81 87 90 96  Resp: 17 18 17 16   Temp:      TempSrc:      SpO2: 100% 100% 100% 100%  Weight:      Height:        Physical Examination: General exam: Alert awake fairly oriented to self place HEENT:Oral mucosa moist, Ear/Nose WNL grossly Respiratory system: B/L clear Cardiovascular system: S1 & S2 +, No JVD. Gastrointestinal system: Abdomen soft,NT,ND, BS+ Nervous System: Alert, awake, RUE contractures weak rt side leg Extremities: extremities warm, leg edema neg Skin: Warm, no rashes MSK: Normal muscle bulk,tone, power   Medications reviewed:  Scheduled Meds:  Chlorhexidine  Gluconate Cloth  6 each Topical Q0600   feeding supplement  1 Container Oral TID BM   folic acid   1 mg Oral Daily   lactulose   20 g Oral TID   multivitamin with minerals  1 tablet Oral Daily   pantoprazole  (PROTONIX ) IV  40 mg Intravenous Q12H   phenytoin   100 mg Oral TID   sodium chloride  flush  10-40 mL Intracatheter Q12H   thiamine   100 mg Oral Daily   Continuous  Infusions:  cefTRIAXone  (ROCEPHIN )  IV 2 g (10/15/24 1100)   dextrose  40 mL/hr at 10/16/24 9040   Diet: Diet Order             Diet NPO time specified Except for: Sips with Meds  Diet effective midnight                  Data Reviewed: I have personally reviewed following labs and imaging studies ( see epic result tab) CBC: Recent Labs  Lab 10/11/24 0807 10/11/24 1837 10/12/24 1804 10/12/24 2020 10/13/24 0315 10/13/24 0851 10/13/24 2250 10/14/24 0519 10/14/24 0824 10/15/24 0503 10/16/24 0346  WBC 6.8   < > 4.9  --  4.7  --   --  3.7*  --  8.2 6.4  NEUTROABS 5.7  --   --   --   --   --   --  2.2  --   --   --   HGB 11.7*   < > 6.7*   < > 8.5*   < > 9.2* 9.3* 10.2* 9.0* 8.2*  HCT 33.8*   < >  19.9*   < > 24.6*   < > 28.5* 28.1* 30.7* 27.3* 25.5*  MCV 86.0   < > 89.2  --  88.5  --   --  91.2  --  91.9 93.8  PLT 216   < > 68*  --  62*  --   --  61*  --  62* 65*   < > = values in this interval not displayed.   CMP: Recent Labs  Lab 10/11/24 1837 10/12/24 0023 10/12/24 1804 10/14/24 0519 10/14/24 2044 10/15/24 0503 10/16/24 0346  NA  --  136 142 148*  --  147* 154*  K 3.9 3.9 3.5 3.2*  --  3.2* 3.1*  CL  --  96* 107 109  --  108 117*  CO2  --  28 30 32  --  30 29  GLUCOSE  --  132* 124* 137*  --  149* 128*  BUN  --  17 13 7*  --  5* 6*  CREATININE  --  0.89 0.75 0.62  --  0.65 0.62  CALCIUM  --  8.1* 8.1* 8.3*  --  8.5* 7.7*  MG 1.2* 3.5* 2.0  --   --   --   --   PHOS  --  2.1* 1.4*  --  1.7*  --   --    GFR: Estimated Creatinine Clearance: 58.9 mL/min (by C-G formula based on SCr of 0.62 mg/dL). Recent Labs  Lab 10/11/24 0805 10/12/24 0023 10/12/24 1804 10/14/24 0519  AST 94* 68* 47* 46*  ALT 35 25 18 18   ALKPHOS 123 82 74 81  BILITOT 0.9 1.0 0.8 1.0  PROT 7.7 5.4* 4.8* 5.2*  ALBUMIN 4.0 2.9* 2.6* 2.7*    Recent Labs  Lab 10/11/24 0805  LIPASE 44    Recent Labs  Lab 10/12/24 1700  AMMONIA 21   Coagulation Profile:  Recent Labs  Lab  10/11/24 0909  INR 1.2   Unresulted Labs (From admission, onward)     Start     Ordered   10/17/24 0500  Comprehensive metabolic panel with GFR  Daily,   R     Question:  Specimen collection method  Answer:  IV Team=IV Team collect   10/16/24 0855   10/17/24 0500  CBC  Daily,   R     Question:  Specimen collection method  Answer:  IV Team=IV Team collect   10/16/24 0855   10/17/24 0500  Magnesium   Tomorrow morning,   R       Question:  Specimen collection method  Answer:  IV Team=IV Team collect   10/16/24 0855   10/17/24 0500  Phosphorus  Tomorrow morning,   R       Question:  Specimen collection method  Answer:  IV Team=IV Team collect   10/16/24 0855   10/11/24 1435  MRSA Next Gen by PCR, Nasal  Once,   R       Question Answer Comment  Patient immune status Normal   Release to patient Immediate      10/11/24 1434           Antimicrobials/Microbiology: Anti-infectives (From admission, onward)    Start     Dose/Rate Route Frequency Ordered Stop   10/15/24 1100  cefTRIAXone  (ROCEPHIN ) 2 g in sodium chloride  0.9 % 100 mL IVPB        2 g 200 mL/hr over 30 Minutes Intravenous Every 24 hours 10/15/24 1008     10/12/24 1500  vancomycin  (VANCOREADY) IVPB 750 mg/150 mL  Status:  Discontinued        750 mg 150 mL/hr over 60 Minutes Intravenous Every 24 hours 10/11/24 1422 10/12/24 1143   10/12/24 1400  vancomycin  (VANCOCIN ) IVPB 1000 mg/200 mL premix  Status:  Discontinued        1,000 mg 200 mL/hr over 60 Minutes Intravenous Every 24 hours 10/12/24 1143 10/12/24 1505   10/11/24 1600  ceFEPIme  (MAXIPIME ) 2 g in sodium chloride  0.9 % 100 mL IVPB  Status:  Discontinued        2 g 200 mL/hr over 30 Minutes Intravenous Every 12 hours 10/11/24 1422 10/15/24 1008   10/11/24 1515  vancomycin  (VANCOCIN ) IVPB 1000 mg/200 mL premix        1,000 mg 200 mL/hr over 60 Minutes Intravenous  Once 10/11/24 1422 10/11/24 1546   10/11/24 1100  metroNIDAZOLE  (FLAGYL ) IVPB 500 mg        500  mg 100 mL/hr over 60 Minutes Intravenous  Once 10/11/24 1054 10/11/24 1321   10/11/24 0745  cefTRIAXone  (ROCEPHIN ) 2 g in sodium chloride  0.9 % 100 mL IVPB        2 g 200 mL/hr over 30 Minutes Intravenous  Once 10/11/24 0734 10/11/24 0951         Component Value Date/Time   SDES  10/11/2024 0909    BLOOD LEFT HAND BOTTLES DRAWN AEROBIC AND ANAEROBIC   SPECREQUEST Blood Culture adequate volume 10/11/2024 0909   CULT  10/11/2024 0909    NO GROWTH 5 DAYS Performed at [facility], [address] [place], [place] [address]    REPTSTATUS 10/16/2024 FINAL 10/11/2024 9090    Procedures: Procedures (LRB): EGD (ESOPHAGOGASTRODUODENOSCOPY) (N/A)   Mennie LAMY, MD [facility] 10/16/2024, 12:58 PM   "

## 2024-10-16 NOTE — Anesthesia Preprocedure Evaluation (Addendum)
 "                                  Anesthesia Evaluation  Patient identified by MRN, date of birth, ID band Patient confused    Reviewed: Allergy & Precautions, NPO status , Patient's Chart, lab work & pertinent test results  Airway Mallampati: II  TM Distance: >3 FB Neck ROM: Full    Dental no notable dental hx.    Pulmonary asthma , sleep apnea , former smoker   Pulmonary exam normal        Cardiovascular hypertension,  Rhythm:Regular Rate:Normal     Neuro/Psych Seizures -, Well Controlled,  CVA, Residual Symptoms  negative psych ROS   GI/Hepatic Neg liver ROS, PUD,GERD  Medicated,,  Endo/Other  negative endocrine ROS    Renal/GU   negative genitourinary   Musculoskeletal negative musculoskeletal ROS (+)    Abdominal Normal abdominal exam  (+)   Peds  Hematology  (+) Blood dyscrasia, anemia Lab Results      Component                Value               Date                      WBC                      6.4                 10/16/2024                HGB                      8.2 (L)             10/16/2024                HCT                      25.5 (L)            10/16/2024                MCV                      93.8                10/16/2024                PLT                      65 (L)              10/16/2024             Lab Results      Component                Value               Date                      NA                       154 (H)             10/16/2024  K                        3.1 (L)             10/16/2024                CO2                      29                  10/16/2024                GLUCOSE                  128 (H)             10/16/2024                BUN                      6 (L)               10/16/2024                CREATININE               0.62                10/16/2024                CALCIUM                  7.7 (L)             10/16/2024                GFRNONAA                 >60                  10/16/2024              Anesthesia Other Findings   Reproductive/Obstetrics                              Anesthesia Physical Anesthesia Plan  ASA: 3  Anesthesia Plan: MAC   Post-op Pain Management:    Induction: Intravenous  PONV Risk Score and Plan: 2 and Treatment may vary due to age or medical condition and Propofol  infusion  Airway Management Planned: Simple Face Mask and Nasal Cannula  Additional Equipment: None  Intra-op Plan:   Post-operative Plan:   Informed Consent: I have reviewed the patients History and Physical, chart, labs and discussed the procedure including the risks, benefits and alternatives for the proposed anesthesia with the patient or authorized representative who has indicated his/her understanding and acceptance.     Dental advisory given and Consent reviewed with POA  Plan Discussed with: CRNA  Anesthesia Plan Comments:          Anesthesia Quick Evaluation  "

## 2024-10-16 NOTE — Interval H&P Note (Signed)
 History and Physical Interval Note:  10/16/2024 11:40 AM  [name]  has presented today for surgery, with the diagnosis of GI bleed, anemia.  The various methods of treatment have been discussed with the patient and family. After consideration of risks, benefits and other options for treatment, the patient has consented to  Procedures: EGD (ESOPHAGOGASTRODUODENOSCOPY) (N/A) as a surgical intervention.  The patient's history has been reviewed, patient examined, no change in status, stable for surgery.  I have reviewed the patient's chart and labs.  Questions were answered to the patient's satisfaction.    Sodium has climbed to 154, K is 3.1 and Hgb 8.2 today  EGD today (consent obtained from sister by phone)  Will update hospitalist after and request attention to hypernatremia  Victory LITTIE Brand III

## 2024-10-16 NOTE — Anesthesia Postprocedure Evaluation (Signed)
"   Anesthesia Post Note  Patient: [name]  Procedure(s) Performed: EGD (ESOPHAGOGASTRODUODENOSCOPY)     Patient location during evaluation: PACU Anesthesia Type: MAC Level of consciousness: awake and alert Pain management: pain level controlled Vital Signs Assessment: post-procedure vital signs reviewed and stable Respiratory status: spontaneous breathing, nonlabored ventilation, respiratory function stable and patient connected to nasal cannula oxygen  Cardiovascular status: stable and blood pressure returned to baseline Postop Assessment: no apparent nausea or vomiting Anesthetic complications: no   No notable events documented.  Last Vitals:  Vitals:   10/16/24 1230 10/16/24 1235  BP: 136/63 127/68  Pulse: 87 90  Resp: 18 17  Temp:    SpO2: 100% 100%    Last Pain:  Vitals:   10/16/24 1235  TempSrc:   PainSc: 0-No pain                 [name]      "

## 2024-10-16 NOTE — Transfer of Care (Signed)
 Immediate Anesthesia Transfer of Care Note  Patient: Eloisa LITTIE Lento  Procedure(s) Performed: EGD (ESOPHAGOGASTRODUODENOSCOPY)  Patient Location: PACU and Endoscopy Unit  Anesthesia Type:MAC  Level of Consciousness: drowsy  Airway & Oxygen  Therapy: Patient Spontanous Breathing  Post-op Assessment: Report given to RN and Post -op Vital signs reviewed and stable  Post vital signs: Reviewed and stable  Last Vitals:  Vitals Value Taken Time  BP 113/64 10/16/24 12:20  Temp 36.2 C 10/16/24 12:19  Pulse 81 10/16/24 12:20  Resp 31 10/16/24 12:20  SpO2 99 % 10/16/24 12:20  Vitals shown include unfiled device data.  Last Pain:  Vitals:   10/16/24 1219  TempSrc: Temporal  PainSc:       Patients Stated Pain Goal: 0 (10/13/24 1600)  Complications: No notable events documented.

## 2024-10-16 NOTE — H&P (View-Only) (Signed)
 Patient ID: [name], female   DOB: 21-Aug-1956, 69 y.o.   MRN: [id number]    Progress Note   Subjective  Day # 6 CC: Cirrhosis, EtOH withdrawal, coffee-ground emesis  IV PPI twice daily Octreotide  Maxipime   Tmax 99.3, blood pressure 134/67, pulse 100  Labs today-WBC 6.4/hemoglobin 8.2/hematocrit 25.5/platelets 65-) has drifted but no overt bleeding Sodium 154/potassium 3.1/BUN 6/creatinine 0.62  Patient had been very somnolent yesterday morning a bit more alert yesterday afternoon.  This morning she is alert, denies any abdominal pain, no nausea, asking for food says she is hungry.  She is still disoriented x 3 but cooperative and appropriate   Objective   Vital signs in last 24 hours: Temp:  [97.7 F (36.5 C)-99.3 F (37.4 C)] 97.7 F (36.5 C) (12/30 0525) Pulse Rate:  [108-115] 110 (12/30 0013) Resp:  [16-19] 17 (12/30 0525) BP: (120-146)/(64-82) 134/67 (12/30 0525) SpO2:  [98 %-100 %] 99 % (12/30 0006) Last BM Date : 10/15/24 General:    Older African-American female in NAD Heart:  Regular rate and rhythm; no murmurs Lungs: Respirations even and unlabored, lungs CTA bilaterally Abdomen:  Soft, nontender and nondistended. Normal bowel sounds. Extremities:  Without edema. Neurologic:  Alert and oriented x 1, cooperative, right upper extremity is contractured Psych:  Cooperative.   Intake/Output from previous day: 12/29 0701 - 12/30 0700 In: 778.1 [P.O.:120; I.V.:557.5; IV Piggyback:100.6] Out: 500 [Urine:500] Intake/Output this shift: No intake/output data recorded.  Lab Results: Recent Labs    10/14/24 0519 10/14/24 0824 10/15/24 0503 10/16/24 0346  WBC 3.7*  --  8.2 6.4  HGB 9.3* 10.2* 9.0* 8.2*  HCT 28.1* 30.7* 27.3* 25.5*  PLT 61*  --  62* 65*   BMET Recent Labs    10/14/24 0519 10/15/24 0503 10/16/24 0346  NA 148* 147* 154*  K 3.2* 3.2* 3.1*  CL 109 108 117*  CO2 32 30 29  GLUCOSE 137* 149* 128*  BUN 7* 5* 6*  CREATININE 0.62 0.65  0.62  CALCIUM 8.3* 8.5* 7.7*   LFT Recent Labs    10/14/24 0519  PROT 5.2*  ALBUMIN 2.7*  AST 46*  ALT 18  ALKPHOS 81  BILITOT 1.0   PT/INR No results for input(s): LABPROT, INR in the last 72 hours.       Assessment / Plan:    #43 68 year old African-American female who was transferred from [facility] with acute GI bleed, coffee-ground emesis, anemia, hypotension initially requiring pressors.  She stabilized, has been out of the ICU over the past 3 days, since developed EtOH withdrawal.  MELD-Na=8  Patient has not required any Ativan  over the past 24 hours, she is alert and communicative this morning cooperative but still disoriented  Hemoglobin has drifted to 8.2 but no active bleeding  Etiology of her GI bleeding is not clear, consider esophagitis, portal gastropathy, doubt variceal but possible.  2 previous CVA 3.  Seizure disorder 4.  Circumferential distal esophageal wall thickening on CT suggestive of esophagitis 5 anemia acute on chronic secondary to acute GI blood loss  Plan; keep n.p.o. this morning Patient will be scheduled for EGD with Dr. Legrand, I have spoken with the patient's sister who is power of attorney/[name] who will give verbal consent for the procedure which will be done later this morning.  I reviewed indications risks and benefits and she is agreeable to proceed.  Continue twice daily PPI Discontinue octreotide  based on today's findings Continue lactulose  twice daily Continue  to trend hemoglobin  and transfuse as indicated to keep her hemoglobin 7 To recommendations pending findings at EGD     Principal Problem:   Upper GI bleed Active Problems:   Alcohol abuse   Hypokalemia   Hematemesis   ABLA (acute blood loss anemia)   Lactic acidosis   Transaminitis   SIRS (systemic inflammatory response syndrome) (HCC)     LOS: 5 days   Anni Hocevar PA-C 10/16/2024, 9:14 AM

## 2024-10-16 NOTE — Progress Notes (Signed)
 Consent given via telephone by [name], sister to have EGD done today.

## 2024-10-16 NOTE — Progress Notes (Signed)
 Physical Therapy Treatment Patient Details Name: [name] MRN: [id number] DOB: 1956/04/17 Today's Date: 10/16/2024   History of Present Illness 68 yo F admitted 10/11/24 after being found by family at home, lying on the floor w dark emesis. Found to have undifferentiated shock, liver cirrhosis, EtOH abuse  Alcohol withdrawal  .PMH IBS PUD asthma sz disorder etoh abuse, stroke.    PT Comments  Pt s/p EGD this AM. Pt with min stool incontinence without awareness. Session focused on transfer training to and from [facility] with assist provided for posterior peri care. Pt requiring moderate assist for stand pivot transfers, which is a change from her baseline of being modI with mobility using a cane. 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 Pt remains confused. Patient will benefit from continued inpatient follow up therapy, <3 hours/day to address deficits and maximize functional mobility.     If plan is discharge home, recommend the following: Assistance with cooking/housework;Assist for transportation;Supervision due to cognitive status;Help with stairs or ramp for entrance;A lot of help with walking and/or transfers;A lot of help with bathing/dressing/bathroom   Can travel by private vehicle     No  Equipment Recommendations  None recommended by PT    Recommendations for Other Services       Precautions / Restrictions Precautions Precautions: Fall Recall of Precautions/Restrictions: Impaired Restrictions Weight Bearing Restrictions Per Provider Order: No     Mobility  Bed Mobility Overal bed mobility: Needs Assistance Bed Mobility: Supine to Sit, Sit to Supine     Supine to sit: Mod assist, HOB elevated Sit to supine: Mod assist   General bed mobility comments: Mod assist to facilitate, support trunk, and LEs out of and back into bed. Cues for technique throughout.    Transfers Overall transfer level: Needs assistance Equipment used: 1 person hand  held assist Transfers: Sit to/from Stand, Bed to chair/wheelchair/BSC Sit to Stand: Mod assist Stand pivot transfers: Mod assist         General transfer comment: Mod assist for boost to stand from edge of bed supporting Rt side, pivoting to and from [facility]    Ambulation/Gait                   Stairs             Wheelchair Mobility     Tilt Bed    Modified Rankin (Stroke Patients Only)       Balance Overall balance assessment: Needs assistance Sitting-balance support: No upper extremity supported, Feet supported Sitting balance-Leahy Scale: Fair     Standing balance support: Single extremity supported, During functional activity Standing balance-Leahy Scale: Poor                              Communication Communication Communication: Impaired Factors Affecting Communication: Difficulty expressing self;Reduced clarity of speech  Cognition Arousal: Alert Behavior During Therapy: Flat affect   PT - Cognitive impairments: No family/caregiver present to determine baseline, Initiation, Problem solving, Sequencing                       PT - Cognition Comments: Pt asking to eat a couple times, will not respond to orientation questions. Some inaccuracy with yes/no responses Following commands: Impaired Following commands impaired: Follows one step commands with increased time, Follows one step commands inconsistently    Cueing Cueing Techniques: Verbal cues, Gestural cues, Tactile cues  Exercises  General Comments        Pertinent Vitals/Pain Pain Assessment Pain Assessment: Faces Faces Pain Scale: No hurt    Home Living                          Prior Function            PT Goals (current goals can now be found in the care plan section) Acute Rehab PT Goals Patient Stated Goal: get well PT Goal Formulation: With patient Time For Goal Achievement: 10/27/24 Potential to Achieve Goals: Fair Progress towards  PT goals: Not progressing toward goals - comment    Frequency    Min 2X/week      PT Plan      Co-evaluation              AM-PAC PT 6 Clicks Mobility   Outcome Measure  Help needed turning from your back to your side while in a flat bed without using bedrails?: A Lot Help needed moving from lying on your back to sitting on the side of a flat bed without using bedrails?: A Lot Help needed moving to and from a bed to a chair (including a wheelchair)?: A Lot Help needed standing up from a chair using your arms (e.g., wheelchair or bedside chair)?: A Lot Help needed to walk in hospital room?: Total Help needed climbing 3-5 steps with a railing? : Total 6 Click Score: 10    End of Session Equipment Utilized During Treatment: Gait belt Activity Tolerance: Patient limited by fatigue Patient left: in bed;with call bell/phone within reach;with bed alarm set Nurse Communication: Mobility status;Other (comment) (new purewick, pt requesting to eat) PT Visit Diagnosis: Unsteadiness on feet (R26.81);Other abnormalities of gait and mobility (R26.89);Muscle weakness (generalized) (M62.81);Difficulty in walking, not elsewhere classified (R26.2);Other symptoms and signs involving the nervous system (R29.898)     Time: 8484-8454 PT Time Calculation (min) (ACUTE ONLY): 30 min  Charges:    $Therapeutic Activity: 23-37 mins PT General Charges $$ ACUTE PT VISIT: 1 Visit                     [name], PT, DPT Acute Rehabilitation Services Office [phone]    [name] 10/16/2024, 4:00 PM

## 2024-10-16 NOTE — Progress Notes (Signed)
 PT Cancellation Note  Patient Details Name: [name] MRN: [id number] DOB: Jan 04, 1956   Cancelled Treatment:    Reason Eval/Treat Not Completed: Patient at procedure or test/unavailable (EGD)  Aleck Daring, PT, DPT Acute Rehabilitation Services Office [phone]    [name] 10/16/2024, 11:33 AM

## 2024-10-17 LAB — COMPREHENSIVE METABOLIC PANEL WITH GFR
ALT: 18 U/L (ref 0–44)
AST: 43 U/L — ABNORMAL HIGH (ref 15–41)
Albumin: 2.7 g/dL — ABNORMAL LOW (ref 3.5–5.0)
Alkaline Phosphatase: 134 U/L — ABNORMAL HIGH (ref 38–126)
Anion gap: 8 (ref 5–15)
BUN: <5 mg/dL — ABNORMAL LOW (ref 8–23)
CO2: 25 mmol/L (ref 22–32)
Calcium: 7.5 mg/dL — ABNORMAL LOW (ref 8.9–10.3)
Chloride: 120 mmol/L — ABNORMAL HIGH (ref 98–111)
Creatinine, Ser: 0.59 mg/dL (ref 0.44–1.00)
GFR, Estimated: >60 mL/min
Glucose, Bld: 132 mg/dL — ABNORMAL HIGH (ref 70–99)
Potassium: 3.6 mmol/L (ref 3.5–5.1)
Sodium: 152 mmol/L — ABNORMAL HIGH (ref 135–145)
Total Bilirubin: 0.5 mg/dL (ref 0.0–1.2)
Total Protein: 5.2 g/dL — ABNORMAL LOW (ref 6.5–8.1)

## 2024-10-17 LAB — CBC
HCT: 25.8 % — ABNORMAL LOW (ref 36.0–46.0)
Hemoglobin: 8.2 g/dL — ABNORMAL LOW (ref 12.0–15.0)
MCH: 30.3 pg (ref 26.0–34.0)
MCHC: 31.8 g/dL (ref 30.0–36.0)
MCV: 95.2 fL (ref 80.0–100.0)
Platelets: 79 K/uL — ABNORMAL LOW (ref 150–400)
RBC: 2.71 MIL/uL — ABNORMAL LOW (ref 3.87–5.11)
RDW: 16.7 % — ABNORMAL HIGH (ref 11.5–15.5)
WBC: 5.3 K/uL (ref 4.0–10.5)
nRBC: 0 % (ref 0.0–0.2)

## 2024-10-17 LAB — PHOSPHORUS: Phosphorus: 2.5 mg/dL (ref 2.5–4.6)

## 2024-10-17 LAB — MAGNESIUM: Magnesium: 1.2 mg/dL — ABNORMAL LOW (ref 1.7–2.4)

## 2024-10-17 MED ORDER — MAGNESIUM SULFATE 4 GM/100ML IV SOLN
4.0000 g | Freq: Once | INTRAVENOUS | Status: AC
  Administered 2024-10-17: 4 g via INTRAVENOUS
  Filled 2024-10-17: qty 100

## 2024-10-17 MED ORDER — MAGNESIUM OXIDE -MG SUPPLEMENT 400 (240 MG) MG PO TABS
400.0000 mg | ORAL_TABLET | Freq: Two times a day (BID) | ORAL | Status: DC
  Administered 2024-10-17 – 2024-10-19 (×5): 400 mg via ORAL
  Filled 2024-10-17 (×5): qty 1

## 2024-10-17 NOTE — Progress Notes (Addendum)
 Physical Therapy Treatment Patient Details Name: [name] MRN: [id number] DOB: [birth year] Today's Date: 10/17/2024   History of Present Illness 68 yo F admitted 10/11/24 after being found by family at home, lying on the floor w dark emesis. Found to have undifferentiated shock, liver cirrhosis, EtOH abuse  Alcohol withdrawal  .PMH IBS PUD asthma sz disorder etoh abuse, stroke.    PT Comments  Pt progressing slowly towards her physical therapy goals. Able to verbalize basic needs. Pt requiring modA for transfers and ambulating 10 ft x 2 with a hemiwalker and close chair follow. Pt incontinent of stool and posterior and anterior peri care provided. Patient will benefit from continued inpatient follow up therapy, <3 hours/day.    If plan is discharge home, recommend the following: Assistance with cooking/housework;Assist for transportation;Supervision due to cognitive status;Help with stairs or ramp for entrance;A lot of help with walking and/or transfers;A lot of help with bathing/dressing/bathroom   Can travel by private vehicle     No  Equipment Recommendations  None recommended by PT    Recommendations for Other Services       Precautions / Restrictions Precautions Precautions: Fall Recall of Precautions/Restrictions: Impaired Precaution/Restrictions Comments: Loose stool Restrictions Weight Bearing Restrictions Per Provider Order: No     Mobility  Bed Mobility Overal bed mobility: Needs Assistance Bed Mobility: Supine to Sit     Supine to sit: Mod assist, HOB elevated     General bed mobility comments: Mod assist to facilitate, support trunk, and LEs out of bed. Cues for technique throughout.    Transfers Overall transfer level: Needs assistance Equipment used: Hemi-walker Transfers: Sit to/from Stand, Bed to chair/wheelchair/BSC Sit to Stand: Mod assist, +2 physical assistance, Min assist           General transfer comment: Verbal cues for widening BOS  and anterior weight shift. ModA on initial stand, minA on subsequent stand    Ambulation/Gait Ambulation/Gait assistance: Min assist, +2 safety/equipment Gait Distance (Feet): 10 Feet (10, 10) Assistive device: Hemi-walker Gait Pattern/deviations: Step-to pattern, Decreased stride length, Decreased weight shift to right, Narrow base of support Gait velocity: dec Gait velocity interpretation: <1.31 ft/sec, indicative of household ambulator   General Gait Details: Pt ambulating 10 ft x 2 with hemiwalker and chair follow. Hemiparetic gait pattern   Stairs             Wheelchair Mobility     Tilt Bed    Modified Rankin (Stroke Patients Only)       Balance Overall balance assessment: Needs assistance Sitting-balance support: No upper extremity supported, Feet supported Sitting balance-Leahy Scale: Fair     Standing balance support: Single extremity supported, During functional activity Standing balance-Leahy Scale: Poor                              Communication Communication Communication: Impaired Factors Affecting Communication: Difficulty expressing self;Reduced clarity of speech  Cognition Arousal: Alert Behavior During Therapy: Flat affect   PT - Cognitive impairments: No family/caregiver present to determine baseline, Initiation, Problem solving, Sequencing                       PT - Cognition Comments: Pt able to verbalize some needs i.e. to drink and when she had a bowel movement. Otherwise, doesn't verbalize much. Can follow commands Following commands: Impaired Following commands impaired: Follows one step commands with increased time, Follows one step commands inconsistently  Cueing Cueing Techniques: Verbal cues, Gestural cues, Tactile cues  Exercises      General Comments        Pertinent Vitals/Pain Pain Assessment Pain Assessment: Faces Faces Pain Scale: No hurt    Home Living                           Prior Function            PT Goals (current goals can now be found in the care plan section) Acute Rehab PT Goals Patient Stated Goal: get well PT Goal Formulation: With patient Time For Goal Achievement: 10/27/24 Potential to Achieve Goals: Fair Progress towards PT goals: Progressing toward goals    Frequency    Min 2X/week      PT Plan      Co-evaluation              AM-PAC PT 6 Clicks Mobility   Outcome Measure  Help needed turning from your back to your side while in a flat bed without using bedrails?: A Lot Help needed moving from lying on your back to sitting on the side of a flat bed without using bedrails?: A Lot Help needed moving to and from a bed to a chair (including a wheelchair)?: A Lot Help needed standing up from a chair using your arms (e.g., wheelchair or bedside chair)?: A Lot Help needed to walk in hospital room?: A Little Help needed climbing 3-5 steps with a railing? : Total 6 Click Score: 12    End of Session Equipment Utilized During Treatment: Gait belt Activity Tolerance: Patient limited by fatigue Patient left: with call bell/phone within reach;in chair;with chair alarm set Nurse Communication: Mobility status;Other (comment) (new purewick, pt requesting to eat) PT Visit Diagnosis: Unsteadiness on feet (R26.81);Other abnormalities of gait and mobility (R26.89);Muscle weakness (generalized) (M62.81);Difficulty in walking, not elsewhere classified (R26.2);Other symptoms and signs involving the nervous system (R29.898)     Time: 8794-8769 PT Time Calculation (min) (ACUTE ONLY): 25 min  Charges:    $Therapeutic Activity: 23-37 mins PT General Charges $$ ACUTE PT VISIT: 1 Visit                     Aleck Daring, PT, DPT Acute Rehabilitation Services Office [phone]    [name] 10/17/2024, 2:37 PM

## 2024-10-17 NOTE — Plan of Care (Signed)

## 2024-10-17 NOTE — Progress Notes (Signed)
 " PROGRESS NOTE [name]  FMW:[id number] DOB: 1956-03-07 DOA: 10/11/2024 PCP: Carlette Benita Area, MD  Brief Narrative/Hospital Course: [name] is a 68 y.o. female with PMH of IBS, asthma, stroke, seizure disorder, peptic ulcer disease, alcohol abuse, last seen by family drinking and dependence of alcohol in the evening of 12/24 and was found by family at home lying on the floor with dark emesis, seen at [facility] 12/25 12/25 In the ZI:jqzampoz,pwpupjoob hypotensive  62/44. S/p fluid resuscitated with 2.5 L of fluids.  He was started on ceftriaxone  and metronidazole .  Her blood pressure improved.  WBC 6.8, hemoglobin 1.7, platelets 216.  AST 94, ALT 35, alk phosphatase 123, total bilirubin 0.9.  Sodium 134, potassium 2.4, bicarbonate 26, serum creatinine 1.33.  Lipase 44. CT abdomen and pelvis>>negative for any acute findings. Chest x-ray>>negative for any acute findings. CT of the brain>>negative for any acute findings but showed previous aneurysmal clip. EKG showed sinus tachycardia.  Troponin 38.  Lactic acid >9 Despite aggressive fluid [name] patient was hypotensive and needed pressors  GI was consulted started on octreotide  infusion and pantoprazole  bid and empiric vancomycin  and cefepime . 12/26>transferred to [facility], ICU.  Placed on phenobarbital  taper for alcohol withdrawal remains on octreotide , treated for shock S/P 1 unit transfusion, lactic acidosis improved. 12/28> transferred to [facility] service Mental status remains to be confused, phenobarbital  tapered off, subsequently underwent EGD 12/30 and placed on regular diet PT OT recommending SNF  Subjective: Seen and examined Patient appears alert awake, knows she is in the hospital unable to tell me president name or current year Overnight events afebrile BP stable not hypoxic, labs reviewed sodium improving slowly potassium stable mag 1.2 Phos normalized CBC with improving thrombocytopenia and stable  hemoglobin 8-9g  Assessment and plan:   Severe alcohol abuse Alcohol withdrawal: S/p phenobarbital  tapered off , cont  folate thiamine . TOC consult for cessation resources.  Mentation improving  Acute metabolic encephalopathy: Multifactorial in the setting of alcohol withdrawal acute illness.  Now off cefepime .minimize narcotics, completed phenobarbital  12/29 Mental status appears stable overall improving. Cont ptot plan for SNF  Dark Emesis Concern for upper GI bleeding ABLA on chronic anemia Alcoholic liver cirrhosis\ Esophagitis: GI following for s/pEGD-LA Grade C reflux esophagitis with no bleeding, ucerations from this process (source of recent                     self-limited bleeding) - to cont pi bid x 4 wk. OFF Octreotide .  Discontinue antibiotics. Hb as low as 6.7 this admit s/p 1 u prbc-baseline 10-11 ~ 2 month ago.  Holding stable at 8 to 9 g.  Monitor  Thrombocytopenia Leukopenia: Suspect in the setting of acute illness liver cirrhosis.wbc improved,  platelet slowly trending up.  Monitor CBC closely  Recent Labs  Lab 10/15/24 0503 10/16/24 0346 10/17/24 0208  HGB 9.0* 8.2* 8.2*  HCT 27.3* 25.5* 25.8*  WBC 8.2 6.4 5.3  PLT 62* 65* 79*     Undifferentiated shock Hypovolemic shock Possible hemorrhagic shock: Vital signs stabilized post fluid resuscitation and pressors and blood transfusion.  Hypernatremia Hypokalemia Hypophosphatemia Hypomagnesemia: K and Phos normalized, replace magnesium .  Hypernatremia due to free water  deficit poor intake,on a regular diet, increase D5W to 100 cc/h monitor BMP in a.m. Recent Labs  Lab 10/11/24 1837 10/12/24 0023 10/12/24 1804 10/14/24 0519 10/14/24 2044 10/15/24 0503 10/16/24 0346 10/17/24 0208  K 3.9 3.9 3.5 3.2*  --  3.2* 3.1* 3.6  CALCIUM  --  8.1* 8.1* 8.3*  --  8.5* 7.7* 7.5*  MG 1.2* 3.5* 2.0  --   --   --   --  1.2*  PHOS  --  2.1* 1.4*  --  1.7*  --   --  2.5   Recent Labs  Lab 10/12/24 1804  10/14/24 0519 10/15/24 0503 10/16/24 0346 10/17/24 0208  NA 142 148* 147* 154* 152*     Lactic acidosis up to >9 In the setting of alcohol abuse hypovolemia.  Level 5.6 on 12/26 In ICU   Seizure disorder Cont dilantin   Moderate malnutrition Hypoalbuminemia: Augment diet as tolerated   Deconditioning/debility/weakness Continue PT OT recommending SNF as below. PT Orders: Active PT Follow up Rec: Skilled Nursing-Short Term Rehab (<3 Hours/Day)10/16/2024 1557   DVT prophylaxis: SCDs Start: 10/11/24 1354 Code Status:   Code Status: Full Code Family Communication: plan of care discussed with patient at bedside.  Patient status is: Remains hospitalized because of severity of illness Level of care: Med-Surg   Dispo: The patient is from: home            Anticipated disposition:  SNF tomorrow if electrolytes stable. SNF bed pending  Objective: Vitals last 24 hrs: Vitals:   10/16/24 1710 10/16/24 1936 10/16/24 2315 10/17/24 0800  BP: 124/60 105/60 111/67 131/75  Pulse: 93 90 81 91  Resp:  17 17 16   Temp: 98.6 F (37 C) 97.7 F (36.5 C) 97.7 F (36.5 C) 98.8 F (37.1 C)  TempSrc:   Oral Oral  SpO2: 100% 100% 95% 100%  Weight:      Height:        Physical Examination: General exam: AAOx2 HEENT:Oral mucosa moist, Ear/Nose WNL grossly Respiratory system:  CTA bilaterally Cardiovascular system: S1 & S2 +, No JVD. Gastrointestinal system: Abdomen soft,NT,ND, BS+ Nervous System: Alert, awake, right arm with contractures weak rt side leg, able to move left side Extremities: extremities warm, leg edema neg Skin: Warm, no rashes MSK: Normal muscle bulk,tone, power   Medications reviewed:  Scheduled Meds:  Chlorhexidine  Gluconate Cloth  6 each Topical Q0600   feeding supplement  1 Container Oral TID BM   folic acid   1 mg Oral Daily   lactulose   20 g Oral TID   magnesium  oxide  400 mg Oral BID   multivitamin with minerals  1 tablet Oral Daily   pantoprazole   40 mg Oral  BID AC   phenytoin   100 mg Oral TID   sodium chloride  flush  10-40 mL Intracatheter Q12H   thiamine   100 mg Oral Daily   Continuous Infusions:  magnesium  sulfate bolus IVPB 4 g (10/17/24 0916)   Diet: Diet Order             Diet 2 gram sodium Fluid consistency: Thin  Diet effective now                  Data Reviewed: I have personally reviewed following labs and imaging studies ( see epic result tab) CBC: Recent Labs  Lab 10/11/24 0807 10/11/24 1837 10/13/24 0315 10/13/24 0851 10/14/24 0519 10/14/24 0824 10/15/24 0503 10/16/24 0346 10/17/24 0208  WBC 6.8   < > 4.7  --  3.7*  --  8.2 6.4 5.3  NEUTROABS 5.7  --   --   --  2.2  --   --   --   --   HGB 11.7*   < > 8.5*   < > 9.3* 10.2* 9.0* 8.2* 8.2*  HCT 33.8*   < >  24.6*   < > 28.1* 30.7* 27.3* 25.5* 25.8*  MCV 86.0   < > 88.5  --  91.2  --  91.9 93.8 95.2  PLT 216   < > 62*  --  61*  --  62* 65* 79*   < > = values in this interval not displayed.   CMP: Recent Labs  Lab 10/11/24 1837 10/12/24 0023 10/12/24 1804 10/14/24 0519 10/14/24 2044 10/15/24 0503 10/16/24 0346 10/17/24 0208  NA  --  136 142 148*  --  147* 154* 152*  K 3.9 3.9 3.5 3.2*  --  3.2* 3.1* 3.6  CL  --  96* 107 109  --  108 117* 120*  CO2  --  28 30 32  --  30 29 25   GLUCOSE  --  132* 124* 137*  --  149* 128* 132*  BUN  --  17 13 7*  --  5* 6* <5*  CREATININE  --  0.89 0.75 0.62  --  0.65 0.62 0.59  CALCIUM  --  8.1* 8.1* 8.3*  --  8.5* 7.7* 7.5*  MG 1.2* 3.5* 2.0  --   --   --   --  1.2*  PHOS  --  2.1* 1.4*  --  1.7*  --   --  2.5   GFR: Estimated Creatinine Clearance: 58.9 mL/min (by C-G formula based on SCr of 0.59 mg/dL). Recent Labs  Lab 10/11/24 0805 10/12/24 0023 10/12/24 1804 10/14/24 0519 10/17/24 0208  AST 94* 68* 47* 46* 43*  ALT 35 25 18 18 18   ALKPHOS 123 82 74 81 134*  BILITOT 0.9 1.0 0.8 1.0 0.5  PROT 7.7 5.4* 4.8* 5.2* 5.2*  ALBUMIN 4.0 2.9* 2.6* 2.7* 2.7*    Recent Labs  Lab 10/11/24 0805  LIPASE 44     Recent Labs  Lab 10/12/24 1700  AMMONIA 21   Coagulation Profile:  Recent Labs  Lab 10/11/24 0909  INR 1.2   Unresulted Labs (From admission, onward)     Start     Ordered   10/18/24 0500  CBC  Tomorrow morning,   R       Question:  Specimen collection method  Answer:  IV Team=IV Team collect   10/17/24 0847   10/18/24 0500  Magnesium   Tomorrow morning,   R       Question:  Specimen collection method  Answer:  IV Team=IV Team collect   10/17/24 0847   10/18/24 0500  Basic metabolic panel with GFR  Tomorrow morning,   R       Question:  Specimen collection method  Answer:  IV Team=IV Team collect   10/17/24 0847   10/11/24 1435  MRSA Next Gen by PCR, Nasal  Once,   R       Question Answer Comment  Patient immune status Normal   Release to patient Immediate      10/11/24 1434           Antimicrobials/Microbiology: Anti-infectives (From admission, onward)    Start     Dose/Rate Route Frequency Ordered Stop   10/15/24 1100  cefTRIAXone  (ROCEPHIN ) 2 g in sodium chloride  0.9 % 100 mL IVPB  Status:  Discontinued        2 g 200 mL/hr over 30 Minutes Intravenous Every 24 hours 10/15/24 1008 10/17/24 0847   10/12/24 1500  vancomycin  (VANCOREADY) IVPB 750 mg/150 mL  Status:  Discontinued        750 mg 150  mL/hr over 60 Minutes Intravenous Every 24 hours 10/11/24 1422 10/12/24 1143   10/12/24 1400  vancomycin  (VANCOCIN ) IVPB 1000 mg/200 mL premix  Status:  Discontinued        1,000 mg 200 mL/hr over 60 Minutes Intravenous Every 24 hours 10/12/24 1143 10/12/24 1505   10/11/24 1600  ceFEPIme  (MAXIPIME ) 2 g in sodium chloride  0.9 % 100 mL IVPB  Status:  Discontinued        2 g 200 mL/hr over 30 Minutes Intravenous Every 12 hours 10/11/24 1422 10/15/24 1008   10/11/24 1515  vancomycin  (VANCOCIN ) IVPB 1000 mg/200 mL premix        1,000 mg 200 mL/hr over 60 Minutes Intravenous  Once 10/11/24 1422 10/11/24 1546   10/11/24 1100  metroNIDAZOLE  (FLAGYL ) IVPB 500 mg        500  mg 100 mL/hr over 60 Minutes Intravenous  Once 10/11/24 1054 10/11/24 1321   10/11/24 0745  cefTRIAXone  (ROCEPHIN ) 2 g in sodium chloride  0.9 % 100 mL IVPB        2 g 200 mL/hr over 30 Minutes Intravenous  Once 10/11/24 0734 10/11/24 0951         Component Value Date/Time   SDES  10/11/2024 0909    BLOOD LEFT HAND BOTTLES DRAWN AEROBIC AND ANAEROBIC   SPECREQUEST Blood Culture adequate volume 10/11/2024 0909   CULT  10/11/2024 0909    NO GROWTH 5 DAYS Performed at [facility], [address], [place], [place] [address]    REPTSTATUS 10/16/2024 FINAL 10/11/2024 9090    Procedures: Procedures (LRB): EGD (ESOPHAGOGASTRODUODENOSCOPY) (N/A)   Mennie LAMY, MD [facility] 10/17/2024, 10:48 AM   "

## 2024-10-18 ENCOUNTER — Encounter (HOSPITAL_COMMUNITY): Payer: Self-pay | Admitting: Gastroenterology

## 2024-10-18 DIAGNOSIS — K7030 Alcoholic cirrhosis of liver without ascites: Secondary | ICD-10-CM | POA: Insufficient documentation

## 2024-10-18 DIAGNOSIS — D62 Acute posthemorrhagic anemia: Secondary | ICD-10-CM | POA: Diagnosis not present

## 2024-10-18 DIAGNOSIS — F10931 Alcohol use, unspecified with withdrawal delirium: Secondary | ICD-10-CM

## 2024-10-18 DIAGNOSIS — F10939 Alcohol use, unspecified with withdrawal, unspecified: Secondary | ICD-10-CM | POA: Insufficient documentation

## 2024-10-18 DIAGNOSIS — K922 Gastrointestinal hemorrhage, unspecified: Secondary | ICD-10-CM | POA: Diagnosis not present

## 2024-10-18 DIAGNOSIS — R5381 Other malaise: Secondary | ICD-10-CM | POA: Insufficient documentation

## 2024-10-18 LAB — CBC
HCT: 26.3 % — ABNORMAL LOW (ref 36.0–46.0)
Hemoglobin: 8.5 g/dL — ABNORMAL LOW (ref 12.0–15.0)
MCH: 30.5 pg (ref 26.0–34.0)
MCHC: 32.3 g/dL (ref 30.0–36.0)
MCV: 94.3 fL (ref 80.0–100.0)
Platelets: 86 K/uL — ABNORMAL LOW (ref 150–400)
RBC: 2.79 MIL/uL — ABNORMAL LOW (ref 3.87–5.11)
RDW: 16.3 % — ABNORMAL HIGH (ref 11.5–15.5)
WBC: 3.8 K/uL — ABNORMAL LOW (ref 4.0–10.5)
nRBC: 0 % (ref 0.0–0.2)

## 2024-10-18 LAB — MAGNESIUM: Magnesium: 1.9 mg/dL (ref 1.7–2.4)

## 2024-10-18 LAB — BASIC METABOLIC PANEL WITH GFR
Anion gap: 8 (ref 5–15)
BUN: <5 mg/dL — ABNORMAL LOW (ref 8–23)
CO2: 24 mmol/L (ref 22–32)
Calcium: 7.9 mg/dL — ABNORMAL LOW (ref 8.9–10.3)
Chloride: 112 mmol/L — ABNORMAL HIGH (ref 98–111)
Creatinine, Ser: 0.5 mg/dL (ref 0.44–1.00)
GFR, Estimated: >60 mL/min
Glucose, Bld: 110 mg/dL — ABNORMAL HIGH (ref 70–99)
Potassium: 3.4 mmol/L — ABNORMAL LOW (ref 3.5–5.1)
Sodium: 144 mmol/L (ref 135–145)

## 2024-10-18 MED ORDER — FOLIC ACID 1 MG PO TABS: 1.0000 mg | ORAL_TABLET | Freq: Every day | ORAL | Status: AC

## 2024-10-18 MED ORDER — SODIUM CHLORIDE 0.9% FLUSH: 10.0000 mL | INTRAVENOUS | Status: DC | PRN

## 2024-10-18 MED ORDER — PANTOPRAZOLE SODIUM 40 MG PO TBEC: 40.0000 mg | DELAYED_RELEASE_TABLET | Freq: Every day | ORAL | Status: AC

## 2024-10-18 MED ORDER — VITAMIN B-1 100 MG PO TABS: 100.0000 mg | ORAL_TABLET | Freq: Every day | ORAL | Status: AC

## 2024-10-18 MED ORDER — CHLORHEXIDINE GLUCONATE CLOTH 2 % EX PADS
6.0000 | MEDICATED_PAD | Freq: Every day | CUTANEOUS | Status: DC
  Administered 2024-10-18: 6 via TOPICAL

## 2024-10-18 MED ORDER — ADULT MULTIVITAMIN W/MINERALS CH: 1.0000 | ORAL_TABLET | Freq: Every day | ORAL | Status: AC

## 2024-10-18 MED ORDER — LACTULOSE 10 GM/15ML PO SOLN: 20.0000 g | Freq: Two times a day (BID) | ORAL | Status: DC

## 2024-10-18 MED ORDER — PHENYTOIN SODIUM EXTENDED 100 MG PO CAPS: 100.0000 mg | ORAL_CAPSULE | Freq: Three times a day (TID) | ORAL | Status: AC

## 2024-10-18 MED ORDER — PANTOPRAZOLE SODIUM 40 MG PO TBEC: 40.0000 mg | DELAYED_RELEASE_TABLET | Freq: Two times a day (BID) | ORAL | Status: AC

## 2024-10-18 MED ORDER — ACETAMINOPHEN 325 MG PO TABS: 650.0000 mg | ORAL_TABLET | Freq: Four times a day (QID) | ORAL | Status: AC | PRN

## 2024-10-18 MED ORDER — LACTULOSE 10 GM/15ML PO SOLN: 20.0000 g | Freq: Two times a day (BID) | ORAL | Status: AC

## 2024-10-18 MED ORDER — SODIUM CHLORIDE 0.9% FLUSH
10.0000 mL | Freq: Two times a day (BID) | INTRAVENOUS | Status: DC
  Administered 2024-10-18: 10 mL

## 2024-10-18 NOTE — Plan of Care (Signed)

## 2024-10-18 NOTE — Progress Notes (Signed)
 " Progress Note    [name]   FMW:[id number]  DOB: 16-Jul-1956  DOA: 10/11/2024     7 PCP: Carlette Benita Area, MD  Initial CC: Hematemesis  Hospital Course: [name] is a 69 y.o. female with PMH of IBS, asthma, stroke, seizure disorder, peptic ulcer disease, alcohol abuse, last seen by family drinking and dependence of alcohol in the evening of 12/24 and was found by family at home lying on the floor with dark emesis, seen at [facility] 12/25 12/25 In the ZI:jqzampoz,pwpupjoob hypotensive  62/44. S/p fluid resuscitated with 2.5 L of fluids.  He was started on ceftriaxone  and metronidazole .  Her blood pressure improved.  WBC 6.8, hemoglobin 1.7, platelets 216.  AST 94, ALT 35, alk phosphatase 123, total bilirubin 0.9.  Sodium 134, potassium 2.4, bicarbonate 26, serum creatinine 1.33.  Lipase 44. CT abdomen and pelvis>>negative for any acute findings. Chest x-ray>>negative for any acute findings. CT of the brain>>negative for any acute findings but showed previous aneurysmal clip. EKG showed sinus tachycardia.  Troponin 38.  Lactic acid >9 Despite aggressive fluid [name] patient was hypotensive and needed pressors  GI was consulted started on octreotide  infusion and pantoprazole  bid and empiric vancomycin  and cefepime . 12/26>transferred to [name], ICU.  Placed on phenobarbital  taper for alcohol withdrawal remains on octreotide , treated for shock S/P 1 unit transfusion, lactic acidosis improved. 12/28> transferred to [name] service Mental status remains to be confused, phenobarbital  tapered off, subsequently underwent EGD 12/30 and placed on regular diet   Assessment and plan:   Severe alcohol abuse Alcohol withdrawal - resolved  S/p phenobarbital  tapered off , cont  folate thiamine . TOC consult for cessation resources.  Mentation improved  Acute metabolic encephalopathy - resolved  Multifactorial in the setting of alcohol withdrawal acute illness.  Now off  cefepime .minimize narcotics, completed phenobarbital  12/29 Mental status appears stable overall improving Cont ptot, plan for SNF  Dark Emesis Concern for upper GI bleeding ABLA on chronic anemia Alcoholic liver cirrhosis Esophagitis GI following for s/p EGD-LA Grade C reflux esophagitis with no bleeding, ulcerations from this process (source of recent self-limited bleeding) - now off octreotide  and abx - continue PPI BID x 4 weeks  - Hgb stable, 8.5 g/dL  - Having significant diarrhea with lactulose , backing off on dose some  Thrombocytopenia Leukopenia Suspect in the setting of acute illness liver cirrhosis.wbc improved,  platelet slowly trending up  Undifferentiated shock - resolved  Hypovolemic shock - resolved  Possible hemorrhagic shock - resolved  Vital signs stabilized post fluid resuscitation and pressors and blood transfusion  Hypernatremia Hypokalemia Hypophosphatemia Hypomagnesemia - repleted   Lactic acidosis up to >9 In the setting of alcohol abuse hypovolemia.  Level 5.6 on 12/26 In ICU   Seizure disorder Continue dilantin   Moderate malnutrition Hypoalbuminemia Augment diet as tolerated   Deconditioning/debility/weakness Continue PT OT, recommending SNF  Interval History:  No events overnight.  Laying in liquid stool in the bed when seen today. Rehab bed unavailable today.  Tentative plan for discharge tomorrow.   DVT prophylaxis:  SCDs Start: 10/11/24 1354   Code Status:   Code Status: Full Code  Mobility Assessment (Last 72 Hours)     Mobility Assessment     Row Name 10/17/24 2200 10/17/24 1432 10/17/24 0832 10/16/24 2050 10/16/24 1557   Does the patient have exclusion criteria? No- Perform mobility assessment -- No- Perform mobility assessment No- Perform mobility assessment --   What is the highest level of mobility  based on the mobility assessment? Level 3 (Stands with assistance) - Balance while standing  and cannot march in place Level  3 (Stands with assistance) - Balance while standing  and cannot march in place Level 3 (Stands with assistance) - Balance while standing  and cannot march in place Level 3 (Stands with assistance) - Balance while standing  and cannot march in place Level 3 (Stands with assistance) - Balance while standing  and cannot march in place   Is the above level different from baseline mobility prior to current illness? Yes - Recommend PT order -- Yes - Recommend PT order Yes - Recommend PT order --    Row Name 10/16/24 1005 10/16/24 1003 10/15/24 2050       Does the patient have exclusion criteria? No- Perform mobility assessment No- Perform mobility assessment No- Perform mobility assessment     What is the highest level of mobility based on the mobility assessment? Level 3 (Stands with assistance) - Balance while standing  and cannot march in place Level 3 (Stands with assistance) - Balance while standing  and cannot march in place Level 3 (Stands with assistance) - Balance while standing  and cannot march in place     Is the above level different from baseline mobility prior to current illness? Yes - Recommend PT order Yes - Recommend PT order Yes - Recommend PT order        Diet: Diet Orders (From admission, onward)     Start     Ordered   10/16/24 1946  Diet 2 gram sodium Fluid consistency: Thin  Diet effective now       Question:  Fluid consistency:  Answer:  Thin   10/16/24 1945            Barriers to discharge:  Disposition Plan: SNF HH orders placed: N/A Status is: Inpatient  Objective: Blood pressure 125/71, pulse 92, temperature 98.1 F (36.7 C), temperature source Oral, resp. rate 18, height 5' 4 (1.626 m), weight 62.2 kg, SpO2 100%.  Examination:  Physical Exam Constitutional:      Appearance: Normal appearance.  HENT:     Head: Normocephalic and atraumatic.     Mouth/Throat:     Mouth: Mucous membranes are moist.  Eyes:     Extraocular Movements: Extraocular movements  intact.  Cardiovascular:     Rate and Rhythm: Normal rate and regular rhythm.  Pulmonary:     Effort: Pulmonary effort is normal. No respiratory distress.     Breath sounds: Normal breath sounds. No wheezing.  Abdominal:     General: Bowel sounds are normal. There is no distension.     Palpations: Abdomen is soft.     Tenderness: There is no abdominal tenderness.  Musculoskeletal:        General: Normal range of motion.     Cervical back: Normal range of motion and neck supple.  Skin:    General: Skin is warm and dry.  Neurological:     General: No focal deficit present.     Mental Status: She is alert.  Psychiatric:        Mood and Affect: Mood normal.        Data Reviewed: Results for orders placed or performed during the hospital encounter of 10/11/24 (from the past 24 hours)  CBC     Status: Abnormal   Collection Time: 10/18/24  3:15 AM  Result Value Ref Range   WBC 3.8 (L) 4.0 - 10.5 K/uL  RBC 2.79 (L) 3.87 - 5.11 MIL/uL   Hemoglobin 8.5 (L) 12.0 - 15.0 g/dL   HCT 73.6 (L) 63.9 - 53.9 %   MCV 94.3 80.0 - 100.0 fL   MCH 30.5 26.0 - 34.0 pg   MCHC 32.3 30.0 - 36.0 g/dL   RDW 83.6 (H) 88.4 - 84.4 %   Platelets 86 (L) 150 - 400 K/uL   nRBC 0.0 0.0 - 0.2 %  Magnesium      Status: None   Collection Time: 10/18/24  3:15 AM  Result Value Ref Range   Magnesium  1.9 1.7 - 2.4 mg/dL  Basic metabolic panel with GFR     Status: Abnormal   Collection Time: 10/18/24  3:15 AM  Result Value Ref Range   Sodium 144 135 - 145 mmol/L   Potassium 3.4 (L) 3.5 - 5.1 mmol/L   Chloride 112 (H) 98 - 111 mmol/L   CO2 24 22 - 32 mmol/L   Glucose, Bld 110 (H) 70 - 99 mg/dL   BUN <5 (L) 8 - 23 mg/dL   Creatinine, Ser 9.49 0.44 - 1.00 mg/dL   Calcium 7.9 (L) 8.9 - 10.3 mg/dL   GFR, Estimated >39 >39 mL/min   Anion gap 8 5 - 15    I have reviewed pertinent nursing notes, vitals, labs, and images as necessary. I have ordered labwork to follow up on as indicated.  I have reviewed the  last notes from staff over past 24 hours. I have discussed patient's care plan and test results with nursing staff, CM/SW, and other staff as appropriate.  Old records reviewed in assessment of this patient  Time spent: Greater than 50% of the 55 minute visit was spent in counseling/coordination of care for the patient as laid out in the A&P.   LOS: 7 days   Alm Apo, MD [facility] 10/18/2024, 2:14 PM "

## 2024-10-18 NOTE — Plan of Care (Signed)

## 2024-10-18 NOTE — TOC Progression Note (Addendum)
 Transition of Care [facility]) - Progression Note    Patient Details  Name: [name] MRN: [id number] Date of Birth: 07/08/1956  [facility] CM/SW Contact  Arlana JINNY Nicholaus ISRA[place] Phone Number: [phone] 10/18/2024, 9:54 AM  Clinical Narrative:   shara id: [id number]  Patients insurance auth was approved for 10/17/2024-10/21/2024 for [facility]F.   CSW followed up with current following attending to see if patient is medically ready for dc today. TBD.   CSW was notified by current following attending that patient is medically ready for dc. CSW contacted Kia at [facility] who stated that they do not have any beds available today and will have beds tomorrow. CSW notified medical team.   ICM will continue to follow and monitor for dc readiness.        Barriers to Discharge: Continued Medical Work up               Expected Discharge Plan and Services     Post Acute Care Choice: Skilled Nursing Facility Living arrangements for the past 2 months: Single Family Home                                       Social Drivers of Health (SDOH) Interventions SDOH Screenings   Food Insecurity: No Food Insecurity (10/12/2024)  Housing: High Risk (10/12/2024)  Transportation Needs: No Transportation Needs (10/12/2024)  Utilities: Not At Risk (10/12/2024)  Social Connections: Moderately Isolated (10/12/2024)  Tobacco Use: Medium Risk (10/16/2024)    Readmission Risk Interventions    10/12/2024    8:40 AM  Readmission Risk Prevention Plan  Transportation Screening Complete  PCP or Specialist Appt within 5-7 Days Complete  Home Care Screening Complete  Medication Review (RN CM) Complete

## 2024-10-19 DIAGNOSIS — K922 Gastrointestinal hemorrhage, unspecified: Secondary | ICD-10-CM | POA: Diagnosis not present

## 2024-10-19 NOTE — Discharge Summary (Signed)
 " Physician Discharge Summary   [name] FMW:[id number] DOB: 11-10-1955 DOA: 10/11/2024  PCP: Carlette Benita Area, MD  Admit date: 10/11/2024 Discharge date: 10/19/2024  Admitted From: Home Disposition:  SNF Discharging physician: Alm Apo, MD Barriers to discharge: none  Recommendations at discharge: Adjust lactulose  further if necessary if BM still too frequent  Discharge Condition: stable CODE STATUS: Full  Diet recommendation:  Diet Orders (From admission, onward)     Start     Ordered   10/19/24 0000  Diet - low sodium heart healthy        10/19/24 0947   10/16/24 1946  Diet 2 gram sodium Fluid consistency: Thin  Diet effective now       Question:  Fluid consistency:  Answer:  Thin   10/16/24 1945            Hospital Course: [name] is a 69 y.o. female with PMH of IBS, asthma, stroke, seizure disorder, peptic ulcer disease, alcohol abuse, last seen by family drinking and dependence of alcohol in the evening of 12/24 and was found by family at home lying on the floor with dark emesis, seen at [facility] 12/25 12/25 In the ZI:jqzampoz,pwpupjoob hypotensive  62/44. S/p fluid resuscitated with 2.5 L of fluids.  He was started on ceftriaxone  and metronidazole .  Her blood pressure improved.  WBC 6.8, hemoglobin 1.7, platelets 216.  AST 94, ALT 35, alk phosphatase 123, total bilirubin 0.9.  Sodium 134, potassium 2.4, bicarbonate 26, serum creatinine 1.33.  Lipase 44. CT abdomen and pelvis>>negative for any acute findings. Chest x-ray>>negative for any acute findings. CT of the brain>>negative for any acute findings but showed previous aneurysmal clip. EKG showed sinus tachycardia.  Troponin 38.  Lactic acid >9 Despite aggressive fluid [name] patient was hypotensive and needed pressors  GI was consulted started on octreotide  infusion and pantoprazole  bid and empiric vancomycin  and cefepime . 12/26>transferred to [facility], ICU.  Placed on  phenobarbital  taper for alcohol withdrawal remains on octreotide , treated for shock S/P 1 unit transfusion, lactic acidosis improved. 12/28> transferred to [facility] service Mental status remains to be confused, phenobarbital  tapered off, subsequently underwent EGD 12/30 and placed on regular diet   Assessment and plan:   Severe alcohol abuse Alcohol withdrawal - resolved  S/p phenobarbital  tapered off , cont  folate thiamine . TOC consult for cessation resources.  Mentation improved  Acute metabolic encephalopathy - resolved  Multifactorial in the setting of alcohol withdrawal acute illness.  Now off cefepime .minimize narcotics, completed phenobarbital  12/29 Mental status appears stable overall improving Cont ptot, plan for SNF  Dark Emesis Concern for upper GI bleeding ABLA on chronic anemia Alcoholic liver cirrhosis Esophagitis GI following for s/p EGD-LA Grade C reflux esophagitis with no bleeding, ulcerations from this process (source of recent self-limited bleeding) - now off octreotide  and abx - continue PPI BID x 4 weeks  - Hgb stable, 8.5 g/dL  - Having significant diarrhea with lactulose , backing off on dose some  Thrombocytopenia Leukopenia Suspect in the setting of acute illness liver cirrhosis.wbc improved,  platelet slowly trending up  Undifferentiated shock - resolved  Hypovolemic shock - resolved  Possible hemorrhagic shock - resolved  Vital signs stabilized post fluid resuscitation and pressors and blood transfusion  Hypernatremia Hypokalemia Hypophosphatemia Hypomagnesemia - repleted   Lactic acidosis up to >9 In the setting of alcohol abuse hypovolemia.  Level 5.6 on 12/26 In ICU   Seizure disorder Continue dilantin   Moderate malnutrition Hypoalbuminemia Augment diet as  tolerated   Deconditioning/debility/weakness Continue PT OT, recommending SNF    Principal Diagnosis: Upper GI bleed  Discharge Diagnoses: Active Hospital Problems   Diagnosis  Date Noted   Upper GI bleed 10/18/2014    Priority: 1.   Alcohol withdrawal (HCC) 10/18/2024   Cirrhosis, alcoholic (HCC) 10/18/2024   Hypophosphatemia 10/18/2024   Physical deconditioning 10/18/2024   SIRS (systemic inflammatory response syndrome) (HCC) 10/11/2024   Transaminitis 08/04/2022   Hypomagnesemia 11/20/2021   Lactic acidosis 12/19/2020   Acute blood loss anemia 11/02/2019   Hematemesis 10/18/2014   Hypokalemia 10/10/2014   Seizure disorder (HCC) 10/10/2014   Alcohol abuse 09/05/2012    Resolved Hospital Problems  No resolved problems to display.     Discharge Instructions     Diet - low sodium heart healthy   Complete by: As directed    Increase activity slowly   Complete by: As directed       Allergies as of 10/19/2024   No Known Allergies      Medication List     PAUSE taking these medications    Linzess  290 MCG Caps capsule Wait to take this until your doctor or other care provider tells you to start again. Hold until diarrhea improves Generic drug: linaclotide  Take 290 mcg by mouth daily.       STOP taking these medications    HYDROcodone -acetaminophen  5-325 MG tablet Commonly known as: NORCO/VICODIN   PHENobarbital  32.4 MG tablet Commonly known as: LUMINAL   potassium chloride  20 MEQ packet Commonly known as: KLOR-CON    potassium chloride  SA 20 MEQ tablet Commonly known as: KLOR-CON  M       TAKE these medications    acetaminophen  325 MG tablet Commonly known as: TYLENOL  Take 2 tablets (650 mg total) by mouth every 6 (six) hours as needed for mild pain (pain score 1-3), fever, headache or moderate pain (pain score 4-6). What changed:  medication strength how much to take reasons to take this additional instructions   albuterol  108 (90 Base) MCG/ACT inhaler Commonly known as: VENTOLIN  HFA Inhale 2 puffs into the lungs every 6 (six) hours as needed for wheezing or shortness of breath.   folic acid  1 MG tablet Commonly known  as: FOLVITE  Take 1 tablet (1 mg total) by mouth daily.   lactulose  10 GM/15ML solution Commonly known as: CHRONULAC  Take 30 mLs (20 g total) by mouth 2 (two) times daily.   lisinopril  20 MG tablet Commonly known as: ZESTRIL  Take 1 tablet (20 mg total) by mouth daily.   multivitamin with minerals Tabs tablet Take 1 tablet by mouth daily.   pantoprazole  40 MG tablet Commonly known as: PROTONIX  Take 1 tablet (40 mg total) by mouth 2 (two) times daily before a meal. What changed: You were already taking a medication with the same name, and this prescription was added. Make sure you understand how and when to take each.   pantoprazole  40 MG tablet Commonly known as: Protonix  Take 1 tablet (40 mg total) by mouth daily. Start taking on: November 16, 2024 What changed: These instructions start on November 16, 2024. If you are unsure what to do until then, ask your doctor or other care provider.   phenytoin  100 MG ER capsule Commonly known as: DILANTIN  Take 1 capsule (100 mg total) by mouth 3 (three) times daily. What changed:  medication strength how much to take when to take this Another medication with the same name was removed. Continue taking this medication, and follow the directions  you see here.   solifenacin 5 MG tablet Commonly known as: VESICARE Take 5 mg by mouth daily.   thiamine  100 MG tablet Commonly known as: Vitamin B-1 Take 1 tablet (100 mg total) by mouth daily.        Contact information for after-discharge care     Destination     [facility] .   Service: Skilled Nursing Contact information: [address] [place]  [address] [phone]                    Allergies[1]  Consultations:   Procedures:   Discharge Exam: BP 124/80 (BP Location: Right Arm)   Pulse 80   Temp 98.2 F (36.8 C) (Oral)   Resp 17   Ht 5' 4 (1.626 m)   Wt 62.2 kg   SpO2 100%   BMI 23.54 kg/m  Physical Exam Constitutional:       Appearance: Normal appearance.  HENT:     Head: Normocephalic and atraumatic.     Mouth/Throat:     Mouth: Mucous membranes are moist.  Eyes:     Extraocular Movements: Extraocular movements intact.  Cardiovascular:     Rate and Rhythm: Normal rate and regular rhythm.  Pulmonary:     Effort: Pulmonary effort is normal. No respiratory distress.     Breath sounds: Normal breath sounds. No wheezing.  Abdominal:     General: Bowel sounds are normal. There is no distension.     Palpations: Abdomen is soft.     Tenderness: There is no abdominal tenderness.  Musculoskeletal:        General: Normal range of motion.     Cervical back: Normal range of motion and neck supple.  Skin:    General: Skin is warm and dry.  Neurological:     General: No focal deficit present.     Mental Status: She is alert.  Psychiatric:        Mood and Affect: Mood normal.      The results of significant diagnostics from this hospitalization (including imaging, microbiology, ancillary and laboratory) are listed below for reference.   Microbiology: Recent Results (from the past 240 hours)  Blood culture (routine x 2)     Status: None   Collection Time: 10/11/24  8:07 AM   Specimen: BLOOD RIGHT HAND  Result Value Ref Range Status   Specimen Description   Final    BLOOD RIGHT HAND BOTTLES DRAWN AEROBIC AND ANAEROBIC   Special Requests   Final    Blood Culture results may not be optimal due to an inadequate volume of blood received in culture bottles   Culture   Final    NO GROWTH 5 DAYS Performed at [facility], [address], [place], [place] [address]    Report Status 10/16/2024 FINAL  Final  Blood culture (routine x 2)     Status: None   Collection Time: 10/11/24  9:09 AM   Specimen: BLOOD LEFT HAND  Result Value Ref Range Status   Specimen Description   Final    BLOOD LEFT HAND BOTTLES DRAWN AEROBIC AND ANAEROBIC   Special Requests Blood Culture adequate volume  Final   Culture   Final    NO  GROWTH 5 DAYS Performed at [facility], [address], [place], [place] [address]    Report Status 10/16/2024 FINAL  Final  Resp panel by RT-PCR (RSV, Flu A&B, Covid) Anterior Nasal Swab     Status: None  Collection Time: 10/11/24  2:30 PM   Specimen: Anterior Nasal Swab  Result Value Ref Range Status   SARS Coronavirus 2 by RT PCR NEGATIVE NEGATIVE Final    Comment: (NOTE) SARS-CoV-2 target nucleic acids are NOT DETECTED.  The SARS-CoV-2 RNA is generally detectable in upper respiratory specimens during the acute phase of infection. The lowest concentration of SARS-CoV-2 viral copies this assay can detect is 138 copies/mL. A negative result does not preclude SARS-Cov-2 infection and should not be used as the sole basis for treatment or other patient management decisions. A negative result may occur with  improper specimen collection/handling, submission of specimen other than nasopharyngeal swab, presence of viral mutation(s) within the areas targeted by this assay, and inadequate number of viral copies(<138 copies/mL). A negative result must be combined with clinical observations, patient history, and epidemiological information. The expected result is Negative.  Fact Sheet for Patients:  bloggercourse.com  Fact Sheet for Healthcare Providers:  seriousbroker.it  This test is no t yet approved or cleared by the [place]  FDA and  has been authorized for detection and/or diagnosis of SARS-CoV-2 by FDA under an Emergency Use Authorization (EUA). This EUA will remain  in effect (meaning this test can be used) for the duration of the COVID-19 declaration under Section 564(b)(1) of the Act, 21 U.S.C.section 360bbb-3(b)(1), unless the authorization is terminated  or revoked sooner.       Influenza A by PCR NEGATIVE NEGATIVE Final   Influenza B by PCR NEGATIVE NEGATIVE Final    Comment: (NOTE) The Xpert Xpress  SARS-CoV-2/FLU/RSV plus assay is intended as an aid in the diagnosis of influenza from Nasopharyngeal swab specimens and should not be used as a sole basis for treatment. Nasal washings and aspirates are unacceptable for Xpert Xpress SARS-CoV-2/FLU/RSV testing.  Fact Sheet for Patients: bloggercourse.com  Fact Sheet for Healthcare Providers: seriousbroker.it  This test is not yet approved or cleared by the [place]  FDA and has been authorized for detection and/or diagnosis of SARS-CoV-2 by FDA under an Emergency Use Authorization (EUA). This EUA will remain in effect (meaning this test can be used) for the duration of the COVID-19 declaration under Section 564(b)(1) of the Act, 21 U.S.C. section 360bbb-3(b)(1), unless the authorization is terminated or revoked.     Resp Syncytial Virus by PCR NEGATIVE NEGATIVE Final    Comment: (NOTE) Fact Sheet for Patients: bloggercourse.com  Fact Sheet for Healthcare Providers: seriousbroker.it  This test is not yet approved or cleared by the [place]  FDA and has been authorized for detection and/or diagnosis of SARS-CoV-2 by FDA under an Emergency Use Authorization (EUA). This EUA will remain in effect (meaning this test can be used) for the duration of the COVID-19 declaration under Section 564(b)(1) of the Act, 21 U.S.C. section 360bbb-3(b)(1), unless the authorization is terminated or revoked.  Performed at [facility], [address] [place], [place] [address]   MRSA Next Gen by PCR, Nasal     Status: None   Collection Time: 10/11/24  2:30 PM  Result Value Ref Range Status   MRSA by PCR Next Gen NOT DETECTED NOT DETECTED Final    Comment: (NOTE) The GeneXpert MRSA Assay (FDA approved for NASAL specimens only), is one component of a comprehensive MRSA colonization surveillance program. It is not intended to diagnose MRSA  infection nor to guide or monitor treatment for MRSA infections. Test performance is not FDA approved in patients less than 57 years old. Performed at [facility], [address] [place], [place] [address]      Labs: BNP (last 3 results) No results for input(s): BNP in the last 8760 hours. Basic Metabolic Panel: Recent Labs  Lab 10/12/24 1804 10/14/24 0519 10/14/24 2044 10/15/24 0503 10/16/24 0346 10/17/24 0208 10/18/24 0315  NA 142 148*  --  147* 154* 152* 144  K 3.5 3.2*  --  3.2* 3.1* 3.6 3.4*  CL 107 109  --  108 117* 120* 112*  CO2 30 32  --  30 29 25 24   GLUCOSE 124* 137*  --  149* 128* 132* 110*  BUN 13 7*  --  5* 6* <5* <5*  CREATININE 0.75 0.62  --  0.65 0.62 0.59 0.50  CALCIUM 8.1* 8.3*  --  8.5* 7.7* 7.5* 7.9*  MG 2.0  --   --   --   --  1.2* 1.9  PHOS 1.4*  --  1.7*  --   --  2.5  --    Liver Function Tests: Recent Labs  Lab 10/12/24 1804 10/14/24 0519 10/17/24 0208  AST 47* 46* 43*  ALT 18 18 18   ALKPHOS 74 81 134*  BILITOT 0.8 1.0 0.5  PROT 4.8* 5.2* 5.2*  ALBUMIN 2.6* 2.7* 2.7*   No results for input(s): LIPASE, AMYLASE in the last 168 hours. Recent Labs  Lab 10/12/24 1700  AMMONIA 21   CBC: Recent Labs  Lab 10/14/24 0519 10/14/24 0824 10/15/24 0503 10/16/24 0346 10/17/24 0208 10/18/24 0315  WBC 3.7*  --  8.2 6.4 5.3 3.8*  NEUTROABS 2.2  --   --   --   --   --   HGB 9.3* 10.2* 9.0* 8.2* 8.2* 8.5*  HCT 28.1* 30.7* 27.3* 25.5* 25.8* 26.3*  MCV 91.2  --  91.9 93.8 95.2 94.3  PLT 61*  --  62* 65* 79* 86*   Cardiac Enzymes: No results for input(s): CKTOTAL, CKMB, CKMBINDEX, TROPONINI in the last 168 hours. BNP: Invalid input(s): POCBNP CBG: Recent Labs  Lab 10/13/24 0007 10/13/24 0340 10/13/24 0850 10/13/24 1148 10/13/24 1615  GLUCAP 126* 121* 119* 110* 127*   D-Dimer No results for input(s): DDIMER in the last 72 hours. Hgb A1c No results for input(s): HGBA1C in the last 72 hours. Lipid Profile No  results for input(s): CHOL, HDL, LDLCALC, TRIG, CHOLHDL, LDLDIRECT in the last 72 hours. Thyroid function studies No results for input(s): TSH, T4TOTAL, T3FREE, THYROIDAB in the last 72 hours.  Invalid input(s): FREET3 Anemia work up No results for input(s): VITAMINB12, FOLATE, FERRITIN, TIBC, IRON, RETICCTPCT in the last 72 hours. Urinalysis    Component Value Date/Time   COLORURINE YELLOW 10/11/2024 1405   APPEARANCEUR HAZY (A) 10/11/2024 1405   LABSPEC 1.018 10/11/2024 1405   PHURINE 5.0 10/11/2024 1405   GLUCOSEU NEGATIVE 10/11/2024 1405   HGBUR SMALL (A) 10/11/2024 1405   BILIRUBINUR NEGATIVE 10/11/2024 1405   KETONESUR NEGATIVE 10/11/2024 1405   PROTEINUR NEGATIVE 10/11/2024 1405   UROBILINOGEN 0.2 12/26/2014 1927   NITRITE NEGATIVE 10/11/2024 1405   LEUKOCYTESUR NEGATIVE 10/11/2024 1405   Sepsis Labs Recent Labs  Lab 10/15/24 0503 10/16/24 0346 10/17/24 0208 10/18/24 0315  WBC 8.2 6.4 5.3 3.8*   Microbiology Recent Results (from the past 240 hours)  Blood culture (routine x 2)     Status: None   Collection Time: 10/11/24  8:07 AM   Specimen: BLOOD RIGHT HAND  Result Value Ref Range Status   Specimen Description   Final    BLOOD RIGHT HAND BOTTLES  DRAWN AEROBIC AND ANAEROBIC   Special Requests   Final    Blood Culture results may not be optimal due to an inadequate volume of blood received in culture bottles   Culture   Final    NO GROWTH 5 DAYS Performed at [facility], [address] [place], [place] [address]    Report Status 10/16/2024 FINAL  Final  Blood culture (routine x 2)     Status: None   Collection Time: 10/11/24  9:09 AM   Specimen: BLOOD LEFT HAND  Result Value Ref Range Status   Specimen Description   Final    BLOOD LEFT HAND BOTTLES DRAWN AEROBIC AND ANAEROBIC   Special Requests Blood Culture adequate volume  Final   Culture   Final    NO GROWTH 5 DAYS Performed at [facility], [address]  [place], [place] [address]    Report Status 10/16/2024 FINAL  Final  Resp panel by RT-PCR (RSV, Flu A&B, Covid) Anterior Nasal Swab     Status: None   Collection Time: 10/11/24  2:30 PM   Specimen: Anterior Nasal Swab  Result Value Ref Range Status   SARS Coronavirus 2 by RT PCR NEGATIVE NEGATIVE Final    Comment: (NOTE) SARS-CoV-2 target nucleic acids are NOT DETECTED.  The SARS-CoV-2 RNA is generally detectable in upper respiratory specimens during the acute phase of infection. The lowest concentration of SARS-CoV-2 viral copies this assay can detect is 138 copies/mL. A negative result does not preclude SARS-Cov-2 infection and should not be used as the sole basis for treatment or other patient management decisions. A negative result may occur with  improper specimen collection/handling, submission of specimen other than nasopharyngeal swab, presence of viral mutation(s) within the areas targeted by this assay, and inadequate number of viral copies(<138 copies/mL). A negative result must be combined with clinical observations, patient history, and epidemiological information. The expected result is Negative.  Fact Sheet for Patients:  bloggercourse.com  Fact Sheet for Healthcare Providers:  seriousbroker.it  This test is no t yet approved or cleared by the [place]  FDA and  has been authorized for detection and/or diagnosis of SARS-CoV-2 by FDA under an Emergency Use Authorization (EUA). This EUA will remain  in effect (meaning this test can be used) for the duration of the COVID-19 declaration under Section 564(b)(1) of the Act, 21 U.S.C.section 360bbb-3(b)(1), unless the authorization is terminated  or revoked sooner.       Influenza A by PCR NEGATIVE NEGATIVE Final   Influenza B by PCR NEGATIVE NEGATIVE Final    Comment: (NOTE) The Xpert Xpress SARS-CoV-2/FLU/RSV plus assay is intended as an aid in the diagnosis of  influenza from Nasopharyngeal swab specimens and should not be used as a sole basis for treatment. Nasal washings and aspirates are unacceptable for Xpert Xpress SARS-CoV-2/FLU/RSV testing.  Fact Sheet for Patients: bloggercourse.com  Fact Sheet for Healthcare Providers: seriousbroker.it  This test is not yet approved or cleared by the [place]  FDA and has been authorized for detection and/or diagnosis of SARS-CoV-2 by FDA under an Emergency Use Authorization (EUA). This EUA will remain in effect (meaning this test can be used) for the duration of the COVID-19 declaration under Section 564(b)(1) of the Act, 21 U.S.C. section 360bbb-3(b)(1), unless the authorization is terminated or revoked.     Resp Syncytial Virus by PCR NEGATIVE NEGATIVE Final    Comment: (NOTE) Fact Sheet for Patients: bloggercourse.com  Fact Sheet for Healthcare Providers: seriousbroker.it  This test is  not yet approved or cleared by the [place]  FDA and has been authorized for detection and/or diagnosis of SARS-CoV-2 by FDA under an Emergency Use Authorization (EUA). This EUA will remain in effect (meaning this test can be used) for the duration of the COVID-19 declaration under Section 564(b)(1) of the Act, 21 U.S.C. section 360bbb-3(b)(1), unless the authorization is terminated or revoked.  Performed at [facility], [address], [place], [place] [address]   MRSA Next Gen by PCR, Nasal     Status: None   Collection Time: 10/11/24  2:30 PM  Result Value Ref Range Status   MRSA by PCR Next Gen NOT DETECTED NOT DETECTED Final    Comment: (NOTE) The GeneXpert MRSA Assay (FDA approved for NASAL specimens only), is one component of a comprehensive MRSA colonization surveillance program. It is not intended to diagnose MRSA infection nor to guide or monitor treatment for MRSA infections. Test  performance is not FDA approved in patients less than 23 years old. Performed at [facility], [address] [place], [place] [address]     Procedures/Studies: US  EKG SITE RITE Result Date: 10/11/2024 If Site Rite image not attached, placement could not be confirmed due to current cardiac rhythm.  CT ABDOMEN PELVIS W CONTRAST Result Date: 10/11/2024 CLINICAL DATA:  Abdominal pain.  Vomiting blood. EXAM: CT ABDOMEN AND PELVIS WITH CONTRAST TECHNIQUE: Multidetector CT imaging of the abdomen and pelvis was performed using the standard protocol following bolus administration of intravenous contrast. RADIATION DOSE REDUCTION: This exam was performed according to the departmental dose-optimization program which includes automated exposure control, adjustment of the mA and/or kV according to patient size and/or use of iterative reconstruction technique. CONTRAST:  80mL OMNIPAQUE  IOHEXOL  300 MG/ML  SOLN COMPARISON:  08/08/2024 FINDINGS: Lower chest: Dependent atelectasis noted right lung base. Circumferential wall thickening noted distal esophagus. Hepatobiliary: Nodular liver contour is compatible cirrhosis. There is no evidence for gallstones, gallbladder wall thickening, or pericholecystic fluid. No intrahepatic or extrahepatic biliary dilation. Pancreas: No focal mass lesion. No dilatation of the main duct. No intraparenchymal cyst. No peripancreatic edema. Spleen: No splenomegaly. No suspicious focal mass lesion. Adrenals/Urinary Tract: No adrenal nodule or mass. Kidneys unremarkable. No evidence for hydroureter. The urinary bladder appears normal for the degree of distention. Stomach/Bowel: Stomach is distended with fluid and gas. Esophagogastric junction is patent with fluid noted in the distal esophagus suggesting reflux. Duodenum is normally positioned as is the ligament of Treitz. No small bowel wall thickening. No small bowel dilatation. The terminal ileum is normal. The appendix is normal. No gross  colonic mass. No colonic wall thickening. Diverticular changes are noted in the left colon without evidence of diverticulitis. Vascular/Lymphatic: There is mild atherosclerotic calcification of the abdominal aorta without aneurysm. There is no gastrohepatic or hepatoduodenal ligament lymphadenopathy. No retroperitoneal or mesenteric lymphadenopathy. No pelvic sidewall lymphadenopathy. Reproductive: Hysterectomy.  There is no adnexal mass. Other: No intraperitoneal free fluid. Musculoskeletal: No worrisome lytic or sclerotic osseous abnormality. Degenerative changes are noted in the hips bilaterally, left greater than right. IMPRESSION: 1. No acute findings in the abdomen or pelvis. Specifically, no findings to explain the patient's history of abdominal pain and vomiting. 2. Nodular liver contour compatible with cirrhosis. 3. Circumferential wall thickening distal esophagus with fluid in the distal esophagus suggesting reflux. Imaging features compatible with esophagitis. Given the history of vomiting blood, upper endoscopy recommended to exclude neoplasm. 4. Left colonic diverticulosis without diverticulitis. 5.  Aortic Atherosclerosis (ICD10-I70.0). Electronically Signed   By: Camellia  Minus M.D.   On: 10/11/2024 11:58   CT Head Wo Contrast Result Date: 10/11/2024 EXAM: CT HEAD WITHOUT CONTRAST 10/11/2024 11:35:24 AM TECHNIQUE: CT of the head was performed without the administration of intravenous contrast. Automated exposure control, iterative reconstruction, and/or weight based adjustment of the mA/kV was utilized to reduce the radiation dose to as low as reasonably achievable. COMPARISON: Head CT 08/08/2024. CLINICAL HISTORY: 69 year old female with vomiting blood and hypotension. FINDINGS: BRAIN AND VENTRICLES: No acute hemorrhage. No evidence of acute infarct. No extra-axial collection. No mass effect or midline shift. 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 Superimposed multifocal chronic cerebral encephalomalacia, pronounced throughout the anterior and superior left frontal lobe, at the right inferior frontal gyrus. Chronic ex vacuo ventricular enlargement. Stable brain volume. No suspicious intracranial vascular hyperdensity. ORBITS: No acute abnormality. SINUSES: Paranasal sinuses, tympanic cavities and mastoids are well aerated. SOFT TISSUES AND SKULL: No acute soft tissue abnormality. Heterogeneity of the calvarium appears stable since 2021 and most likely benign. No acute or suspicious bone lesion. Chronic severe upper cervical spine degeneration partially visible. IMPRESSION: 1. No acute intracranial abnormality. 2. 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 Electronically signed by: Helayne Hurst MD 10/11/2024 11:40 AM EST RP Workstation: [id number]   DG Chest Portable 1 View Result Date: 10/11/2024 CLINICAL DATA:  Possible aspiration.  Hematemesis.  Hypotension. EXAM: PORTABLE CHEST 1 VIEW COMPARISON:  11/29/2021 and CT chest 08/08/2024. FINDINGS: Patient is rotated. Trachea is midline. Heart size stable. There may be minimal right basilar subsegmental atelectasis. Lungs are otherwise clear. No pleural fluid. No pneumothorax. Osteopenia. IMPRESSION: Minimal right basilar subsegmental atelectasis. Electronically Signed   By: Newell Eke M.D.   On: 10/11/2024 09:06     Time coordinating discharge: Over 30 minutes    Alm Apo, MD  [facility] 10/19/2024, 9:48 AM    [1] No Known Allergies  "

## 2024-10-19 NOTE — Progress Notes (Signed)
 Called to give report to staff stayed on hold for 15 minutes - operator stated she would give them my number and they would call me for report

## 2024-10-19 NOTE — TOC Transition Note (Signed)
 Transition of Care [facility]) - Discharge Note   Patient Details  Name: [name] MRN: [id number] Date of Birth: [birth year]  [facility] CM/SW Contact:  Jeoffrey LITTIE Maranda [place] Phone Number: 10/19/2024, 10:35 AM   Clinical Narrative:    Patient will DC to: [facility]  Anticipated DC date: 10/19/24 Family notified: Yes Transport by: [place]   Per MD patient ready for DC to [facility] RN to call report prior to discharge [phone] room 113P. RN, patient, patient's family, and facility notified of DC. Discharge Summary and FL2 sent to facility. DC packet on chart. Ambulance transport requested for patient.   CSW will sign off for now as social work intervention is no longer needed. Please consult us  again if new needs arise.     Final next level of care: Skilled Nursing Facility Barriers to Discharge: Barriers Resolved   Patient Goals and CMS Choice Patient states their goals for this hospitalization and ongoing recovery are:: SNF CMS Medicare.gov Compare Post Acute Care list provided to:: Patient Choice offered to / list presented to : Patient  ownership interest in [facility].provided to:: Patient    Discharge Placement   Existing PASRR number confirmed : 10/19/24          Patient chooses bed at: [facility] Patient to be transferred to facility by: [facility] Name of family member notified: [facility] Patient and family notified of of transfer: 10/19/24  Discharge Plan and Services Additional resources added to the After Visit Summary for       Post Acute Care Choice: Skilled Nursing Facility                               Social Drivers of Health (SDOH) Interventions SDOH Screenings   Food Insecurity: No Food Insecurity (10/12/2024)  Housing: High Risk (10/12/2024)  Transportation Needs: No Transportation Needs (10/12/2024)  Utilities: Not At Risk (10/12/2024)  Social Connections: Moderately  Isolated (10/12/2024)  Tobacco Use: Medium Risk (10/16/2024)     Readmission Risk Interventions    10/12/2024    8:40 AM  Readmission Risk Prevention Plan  Transportation Screening Complete  PCP or Specialist Appt within 5-7 Days Complete  Home Care Screening Complete  Medication Review (RN CM) Complete

## 2024-10-19 NOTE — Plan of Care (Signed)
" °  Problem: Safety: Goal: Ability to remain free from injury will improve 10/19/2024 0458 by Arna Rhymes, RN Outcome: Progressing   Problem: Pain Managment: Goal: General experience of comfort will improve and/or be controlled 10/19/2024 0458 by Arna Rhymes, RN Outcome: Progressing   Problem: Elimination: Goal: Will not experience complications related to urinary retention 10/19/2024 0458 by Arna Rhymes, RN Outcome: Progressing   Problem: Nutrition: Goal: Adequate nutrition will be maintained 10/19/2024 0458 by Arna Rhymes, RN Outcome: Progressing   "

## 2024-10-19 NOTE — TOC Progression Note (Signed)
 Transition of Care [facility]) - Progression Note    Patient Details  Name: KALASIA CRAFTON MRN: [id number] Date of Birth: Jan 04, 1956  [facility] CM/SW Contact  Kylee Nardozzi LITTIE [place], [place] Phone Number: 10/19/2024, 10:17 AM  Clinical Narrative:    CSW contacted Kia with [facility] to confirm pt can admit to facility today. CSW awaiting response.      Barriers to Discharge: Continued Medical Work up               Expected Discharge Plan and Services     Post Acute Care Choice: Skilled Nursing Facility Living arrangements for the past 2 months: Single Family Home Expected Discharge Date: 10/19/24                                     Social Drivers of Health (SDOH) Interventions SDOH Screenings   Food Insecurity: No Food Insecurity (10/12/2024)  Housing: High Risk (10/12/2024)  Transportation Needs: No Transportation Needs (10/12/2024)  Utilities: Not At Risk (10/12/2024)  Social Connections: Moderately Isolated (10/12/2024)  Tobacco Use: Medium Risk (10/16/2024)    Readmission Risk Interventions    10/12/2024    8:40 AM  Readmission Risk Prevention Plan  Transportation Screening Complete  PCP or Specialist Appt within 5-7 Days Complete  Home Care Screening Complete  Medication Review (RN CM) Complete

## 2024-10-19 NOTE — Plan of Care (Signed)
  Problem: Education: Goal: Knowledge of General Education information will improve Description: Including pain rating scale, medication(s)/side effects and non-pharmacologic comfort measures Outcome: Adequate for Discharge   Problem: Health Behavior/Discharge Planning: Goal: Ability to manage health-related needs will improve Outcome: Adequate for Discharge   Problem: Clinical Measurements: Goal: Ability to maintain clinical measurements within normal limits will improve Outcome: Adequate for Discharge Goal: Will remain free from infection Outcome: Adequate for Discharge Goal: Diagnostic test results will improve Outcome: Adequate for Discharge Goal: Respiratory complications will improve Outcome: Adequate for Discharge Goal: Cardiovascular complication will be avoided Outcome: Adequate for Discharge   Problem: Activity: Goal: Risk for activity intolerance will decrease Outcome: Adequate for Discharge   Problem: Nutrition: Goal: Adequate nutrition will be maintained Outcome: Adequate for Discharge   Problem: Coping: Goal: Level of anxiety will decrease Outcome: Adequate for Discharge   Problem: Elimination: Goal: Will not experience complications related to bowel motility Outcome: Adequate for Discharge Goal: Will not experience complications related to urinary retention Outcome: Adequate for Discharge   Problem: Pain Managment: Goal: General experience of comfort will improve and/or be controlled Outcome: Adequate for Discharge   Problem: Safety: Goal: Ability to remain free from injury will improve Outcome: Adequate for Discharge   Problem: Skin Integrity: Goal: Risk for impaired skin integrity will decrease Outcome: Adequate for Discharge   Problem: Education: Goal: Ability to identify signs and symptoms of gastrointestinal bleeding will improve Outcome: Adequate for Discharge   Problem: Bowel/Gastric: Goal: Will show no signs and symptoms of  gastrointestinal bleeding Outcome: Adequate for Discharge   Problem: Fluid Volume: Goal: Will show no signs and symptoms of excessive bleeding Outcome: Adequate for Discharge   Problem: Clinical Measurements: Goal: Complications related to the disease process, condition or treatment will be avoided or minimized Outcome: Adequate for Discharge
